# Patient Record
Sex: Male | Born: 1940
Health system: Southern US, Community
[De-identification: ages and names within clinical notes are randomized; demographics above are authoritative.]

## PROBLEM LIST (undated history)

## (undated) DIAGNOSIS — H9190 Unspecified hearing loss, unspecified ear: Secondary | ICD-10-CM

## (undated) DIAGNOSIS — E119 Type 2 diabetes mellitus without complications: Secondary | ICD-10-CM

## (undated) DIAGNOSIS — C801 Malignant (primary) neoplasm, unspecified: Secondary | ICD-10-CM

## (undated) DIAGNOSIS — N189 Chronic kidney disease, unspecified: Secondary | ICD-10-CM

## (undated) DIAGNOSIS — N400 Enlarged prostate without lower urinary tract symptoms: Secondary | ICD-10-CM

## (undated) DIAGNOSIS — C61 Malignant neoplasm of prostate: Secondary | ICD-10-CM

## (undated) DIAGNOSIS — I10 Essential (primary) hypertension: Secondary | ICD-10-CM

## (undated) DIAGNOSIS — K4090 Unilateral inguinal hernia, without obstruction or gangrene, not specified as recurrent: Secondary | ICD-10-CM

## (undated) DIAGNOSIS — R52 Pain, unspecified: Secondary | ICD-10-CM

## (undated) HISTORY — PX: COLONOSCOPY: SHX174

## (undated) HISTORY — PX: HERNIA REPAIR: SHX51

## (undated) HISTORY — PX: BACK SURGERY: SHX140

## (undated) HISTORY — PX: EYE SURGERY: SHX253

## (undated) MED FILL — Dexamethasone Sodium Phosphate Inj 100 MG/10ML: INTRAMUSCULAR | Qty: 1 | Status: AC

---

## 2003-12-10 ENCOUNTER — Other Ambulatory Visit: Payer: Self-pay

## 2007-05-30 ENCOUNTER — Ambulatory Visit: Payer: Self-pay | Admitting: Gastroenterology

## 2007-12-19 ENCOUNTER — Ambulatory Visit: Payer: Self-pay | Admitting: Gastroenterology

## 2011-02-22 ENCOUNTER — Ambulatory Visit: Payer: Self-pay | Admitting: Ophthalmology

## 2011-03-07 ENCOUNTER — Ambulatory Visit: Payer: Self-pay | Admitting: Ophthalmology

## 2013-07-22 ENCOUNTER — Ambulatory Visit: Payer: Self-pay | Admitting: Internal Medicine

## 2013-11-21 ENCOUNTER — Ambulatory Visit: Payer: Self-pay | Admitting: Internal Medicine

## 2015-05-14 DIAGNOSIS — E1065 Type 1 diabetes mellitus with hyperglycemia: Secondary | ICD-10-CM | POA: Diagnosis not present

## 2015-05-14 DIAGNOSIS — E785 Hyperlipidemia, unspecified: Secondary | ICD-10-CM | POA: Diagnosis not present

## 2015-05-19 DIAGNOSIS — N183 Chronic kidney disease, stage 3 (moderate): Secondary | ICD-10-CM | POA: Diagnosis not present

## 2015-05-19 DIAGNOSIS — E1065 Type 1 diabetes mellitus with hyperglycemia: Secondary | ICD-10-CM | POA: Diagnosis not present

## 2015-05-19 DIAGNOSIS — I10 Essential (primary) hypertension: Secondary | ICD-10-CM | POA: Diagnosis not present

## 2015-05-19 DIAGNOSIS — I7389 Other specified peripheral vascular diseases: Secondary | ICD-10-CM | POA: Diagnosis not present

## 2015-05-19 DIAGNOSIS — N401 Enlarged prostate with lower urinary tract symptoms: Secondary | ICD-10-CM | POA: Diagnosis not present

## 2015-05-19 DIAGNOSIS — E785 Hyperlipidemia, unspecified: Secondary | ICD-10-CM | POA: Diagnosis not present

## 2015-05-19 DIAGNOSIS — E790 Hyperuricemia without signs of inflammatory arthritis and tophaceous disease: Secondary | ICD-10-CM | POA: Diagnosis not present

## 2015-05-22 DIAGNOSIS — R69 Illness, unspecified: Secondary | ICD-10-CM | POA: Diagnosis not present

## 2015-05-22 DIAGNOSIS — J029 Acute pharyngitis, unspecified: Secondary | ICD-10-CM | POA: Diagnosis not present

## 2015-05-22 DIAGNOSIS — G8252 Quadriplegia, C1-C4 incomplete: Secondary | ICD-10-CM | POA: Diagnosis not present

## 2015-05-22 DIAGNOSIS — K592 Neurogenic bowel, not elsewhere classified: Secondary | ICD-10-CM | POA: Diagnosis not present

## 2015-05-22 DIAGNOSIS — R609 Edema, unspecified: Secondary | ICD-10-CM | POA: Diagnosis not present

## 2015-06-22 DIAGNOSIS — K592 Neurogenic bowel, not elsewhere classified: Secondary | ICD-10-CM | POA: Diagnosis not present

## 2015-06-22 DIAGNOSIS — R69 Illness, unspecified: Secondary | ICD-10-CM | POA: Diagnosis not present

## 2015-06-22 DIAGNOSIS — J029 Acute pharyngitis, unspecified: Secondary | ICD-10-CM | POA: Diagnosis not present

## 2015-06-22 DIAGNOSIS — G8252 Quadriplegia, C1-C4 incomplete: Secondary | ICD-10-CM | POA: Diagnosis not present

## 2015-06-22 DIAGNOSIS — R609 Edema, unspecified: Secondary | ICD-10-CM | POA: Diagnosis not present

## 2015-06-29 DIAGNOSIS — R5381 Other malaise: Secondary | ICD-10-CM | POA: Diagnosis not present

## 2015-06-29 DIAGNOSIS — E119 Type 2 diabetes mellitus without complications: Secondary | ICD-10-CM | POA: Diagnosis not present

## 2015-06-29 DIAGNOSIS — R270 Ataxia, unspecified: Secondary | ICD-10-CM | POA: Diagnosis not present

## 2015-06-29 DIAGNOSIS — E1129 Type 2 diabetes mellitus with other diabetic kidney complication: Secondary | ICD-10-CM | POA: Diagnosis not present

## 2015-06-29 DIAGNOSIS — Z125 Encounter for screening for malignant neoplasm of prostate: Secondary | ICD-10-CM | POA: Diagnosis not present

## 2015-06-29 DIAGNOSIS — E784 Other hyperlipidemia: Secondary | ICD-10-CM | POA: Diagnosis not present

## 2015-06-29 DIAGNOSIS — I10 Essential (primary) hypertension: Secondary | ICD-10-CM | POA: Diagnosis not present

## 2015-07-01 ENCOUNTER — Other Ambulatory Visit: Payer: Self-pay | Admitting: Internal Medicine

## 2015-07-01 DIAGNOSIS — R2681 Unsteadiness on feet: Secondary | ICD-10-CM

## 2015-07-06 ENCOUNTER — Other Ambulatory Visit: Payer: Self-pay | Admitting: Internal Medicine

## 2015-07-06 ENCOUNTER — Ambulatory Visit
Admission: RE | Admit: 2015-07-06 | Discharge: 2015-07-06 | Disposition: A | Discharge status: Home / Self Care | Payer: Medicare HMO | Source: Ambulatory Visit | Attending: Internal Medicine | Admitting: Internal Medicine

## 2015-07-06 DIAGNOSIS — R2689 Other abnormalities of gait and mobility: Secondary | ICD-10-CM | POA: Diagnosis not present

## 2015-07-06 DIAGNOSIS — R2681 Unsteadiness on feet: Secondary | ICD-10-CM | POA: Insufficient documentation

## 2015-07-06 LAB — POCT I-STAT CREATININE: Creatinine, Ser: 1.6 mg/dL — ABNORMAL HIGH (ref 0.61–1.24)

## 2015-07-06 MED ORDER — IOHEXOL 300 MG/ML  SOLN
80.0000 mL | Freq: Once | INTRAMUSCULAR | Status: AC | PRN
  Administered 2015-07-06: 80 mL via INTRAVENOUS

## 2015-07-09 DIAGNOSIS — I080 Rheumatic disorders of both mitral and aortic valves: Secondary | ICD-10-CM | POA: Diagnosis not present

## 2015-07-09 DIAGNOSIS — E119 Type 2 diabetes mellitus without complications: Secondary | ICD-10-CM | POA: Diagnosis not present

## 2015-07-09 DIAGNOSIS — R2681 Unsteadiness on feet: Secondary | ICD-10-CM | POA: Diagnosis not present

## 2015-07-09 DIAGNOSIS — E8881 Metabolic syndrome: Secondary | ICD-10-CM | POA: Diagnosis not present

## 2015-07-15 DIAGNOSIS — M545 Low back pain: Secondary | ICD-10-CM | POA: Diagnosis not present

## 2015-07-15 DIAGNOSIS — M21371 Foot drop, right foot: Secondary | ICD-10-CM | POA: Diagnosis not present

## 2015-07-20 DIAGNOSIS — J029 Acute pharyngitis, unspecified: Secondary | ICD-10-CM | POA: Diagnosis not present

## 2015-07-20 DIAGNOSIS — R69 Illness, unspecified: Secondary | ICD-10-CM | POA: Diagnosis not present

## 2015-07-20 DIAGNOSIS — G8252 Quadriplegia, C1-C4 incomplete: Secondary | ICD-10-CM | POA: Diagnosis not present

## 2015-07-20 DIAGNOSIS — R609 Edema, unspecified: Secondary | ICD-10-CM | POA: Diagnosis not present

## 2015-07-20 DIAGNOSIS — K592 Neurogenic bowel, not elsewhere classified: Secondary | ICD-10-CM | POA: Diagnosis not present

## 2015-08-16 DIAGNOSIS — E1065 Type 1 diabetes mellitus with hyperglycemia: Secondary | ICD-10-CM | POA: Diagnosis not present

## 2015-08-19 DIAGNOSIS — E1065 Type 1 diabetes mellitus with hyperglycemia: Secondary | ICD-10-CM | POA: Diagnosis not present

## 2015-08-19 DIAGNOSIS — J029 Acute pharyngitis, unspecified: Secondary | ICD-10-CM | POA: Diagnosis not present

## 2015-08-19 DIAGNOSIS — G8252 Quadriplegia, C1-C4 incomplete: Secondary | ICD-10-CM | POA: Diagnosis not present

## 2015-08-19 DIAGNOSIS — E790 Hyperuricemia without signs of inflammatory arthritis and tophaceous disease: Secondary | ICD-10-CM | POA: Diagnosis not present

## 2015-08-19 DIAGNOSIS — M545 Low back pain: Secondary | ICD-10-CM | POA: Diagnosis not present

## 2015-08-19 DIAGNOSIS — N183 Chronic kidney disease, stage 3 (moderate): Secondary | ICD-10-CM | POA: Diagnosis not present

## 2015-08-19 DIAGNOSIS — I10 Essential (primary) hypertension: Secondary | ICD-10-CM | POA: Diagnosis not present

## 2015-08-19 DIAGNOSIS — I7389 Other specified peripheral vascular diseases: Secondary | ICD-10-CM | POA: Diagnosis not present

## 2015-08-19 DIAGNOSIS — R69 Illness, unspecified: Secondary | ICD-10-CM | POA: Diagnosis not present

## 2015-08-19 DIAGNOSIS — R609 Edema, unspecified: Secondary | ICD-10-CM | POA: Diagnosis not present

## 2015-08-19 DIAGNOSIS — E785 Hyperlipidemia, unspecified: Secondary | ICD-10-CM | POA: Diagnosis not present

## 2015-08-19 DIAGNOSIS — K592 Neurogenic bowel, not elsewhere classified: Secondary | ICD-10-CM | POA: Diagnosis not present

## 2015-08-19 DIAGNOSIS — N401 Enlarged prostate with lower urinary tract symptoms: Secondary | ICD-10-CM | POA: Diagnosis not present

## 2015-08-23 DIAGNOSIS — M21372 Foot drop, left foot: Secondary | ICD-10-CM | POA: Diagnosis not present

## 2015-08-23 DIAGNOSIS — M21371 Foot drop, right foot: Secondary | ICD-10-CM | POA: Diagnosis not present

## 2015-08-23 DIAGNOSIS — R2681 Unsteadiness on feet: Secondary | ICD-10-CM | POA: Diagnosis not present

## 2015-08-23 DIAGNOSIS — E1142 Type 2 diabetes mellitus with diabetic polyneuropathy: Secondary | ICD-10-CM | POA: Diagnosis not present

## 2015-09-01 DIAGNOSIS — N138 Other obstructive and reflux uropathy: Secondary | ICD-10-CM | POA: Diagnosis not present

## 2015-09-01 DIAGNOSIS — R351 Nocturia: Secondary | ICD-10-CM | POA: Diagnosis not present

## 2015-09-01 DIAGNOSIS — D400 Neoplasm of uncertain behavior of prostate: Secondary | ICD-10-CM | POA: Diagnosis not present

## 2015-09-03 DIAGNOSIS — M21372 Foot drop, left foot: Secondary | ICD-10-CM | POA: Diagnosis not present

## 2015-09-03 DIAGNOSIS — R2681 Unsteadiness on feet: Secondary | ICD-10-CM | POA: Diagnosis not present

## 2015-09-28 DIAGNOSIS — R609 Edema, unspecified: Secondary | ICD-10-CM | POA: Diagnosis not present

## 2015-09-28 DIAGNOSIS — J029 Acute pharyngitis, unspecified: Secondary | ICD-10-CM | POA: Diagnosis not present

## 2015-09-28 DIAGNOSIS — R69 Illness, unspecified: Secondary | ICD-10-CM | POA: Diagnosis not present

## 2015-09-28 DIAGNOSIS — G8252 Quadriplegia, C1-C4 incomplete: Secondary | ICD-10-CM | POA: Diagnosis not present

## 2015-09-28 DIAGNOSIS — K592 Neurogenic bowel, not elsewhere classified: Secondary | ICD-10-CM | POA: Diagnosis not present

## 2015-10-08 DIAGNOSIS — E118 Type 2 diabetes mellitus with unspecified complications: Secondary | ICD-10-CM | POA: Diagnosis not present

## 2015-10-08 DIAGNOSIS — E784 Other hyperlipidemia: Secondary | ICD-10-CM | POA: Diagnosis not present

## 2015-10-08 DIAGNOSIS — E119 Type 2 diabetes mellitus without complications: Secondary | ICD-10-CM | POA: Diagnosis not present

## 2015-10-08 DIAGNOSIS — I080 Rheumatic disorders of both mitral and aortic valves: Secondary | ICD-10-CM | POA: Diagnosis not present

## 2015-10-21 DIAGNOSIS — R079 Chest pain, unspecified: Secondary | ICD-10-CM | POA: Diagnosis not present

## 2015-10-21 DIAGNOSIS — E118 Type 2 diabetes mellitus with unspecified complications: Secondary | ICD-10-CM | POA: Diagnosis not present

## 2015-10-21 DIAGNOSIS — R0602 Shortness of breath: Secondary | ICD-10-CM | POA: Diagnosis not present

## 2015-10-21 DIAGNOSIS — R200 Anesthesia of skin: Secondary | ICD-10-CM | POA: Diagnosis not present

## 2015-10-28 DIAGNOSIS — R0602 Shortness of breath: Secondary | ICD-10-CM | POA: Diagnosis not present

## 2015-10-28 DIAGNOSIS — E118 Type 2 diabetes mellitus with unspecified complications: Secondary | ICD-10-CM | POA: Diagnosis not present

## 2015-10-28 DIAGNOSIS — I080 Rheumatic disorders of both mitral and aortic valves: Secondary | ICD-10-CM | POA: Diagnosis not present

## 2015-10-28 DIAGNOSIS — R079 Chest pain, unspecified: Secondary | ICD-10-CM | POA: Diagnosis not present

## 2015-10-29 DIAGNOSIS — R609 Edema, unspecified: Secondary | ICD-10-CM | POA: Diagnosis not present

## 2015-10-29 DIAGNOSIS — K592 Neurogenic bowel, not elsewhere classified: Secondary | ICD-10-CM | POA: Diagnosis not present

## 2015-10-29 DIAGNOSIS — J029 Acute pharyngitis, unspecified: Secondary | ICD-10-CM | POA: Diagnosis not present

## 2015-10-29 DIAGNOSIS — G8252 Quadriplegia, C1-C4 incomplete: Secondary | ICD-10-CM | POA: Diagnosis not present

## 2015-10-29 DIAGNOSIS — R69 Illness, unspecified: Secondary | ICD-10-CM | POA: Diagnosis not present

## 2015-10-29 DIAGNOSIS — Z954 Presence of other heart-valve replacement: Secondary | ICD-10-CM | POA: Diagnosis not present

## 2015-11-04 DIAGNOSIS — M170 Bilateral primary osteoarthritis of knee: Secondary | ICD-10-CM | POA: Diagnosis not present

## 2015-11-11 DIAGNOSIS — E1065 Type 1 diabetes mellitus with hyperglycemia: Secondary | ICD-10-CM | POA: Diagnosis not present

## 2015-11-11 DIAGNOSIS — Z125 Encounter for screening for malignant neoplasm of prostate: Secondary | ICD-10-CM | POA: Diagnosis not present

## 2015-11-11 DIAGNOSIS — N183 Chronic kidney disease, stage 3 (moderate): Secondary | ICD-10-CM | POA: Diagnosis not present

## 2015-11-11 DIAGNOSIS — I7389 Other specified peripheral vascular diseases: Secondary | ICD-10-CM | POA: Diagnosis not present

## 2015-11-18 DIAGNOSIS — I7389 Other specified peripheral vascular diseases: Secondary | ICD-10-CM | POA: Diagnosis not present

## 2015-11-18 DIAGNOSIS — N401 Enlarged prostate with lower urinary tract symptoms: Secondary | ICD-10-CM | POA: Diagnosis not present

## 2015-11-18 DIAGNOSIS — E785 Hyperlipidemia, unspecified: Secondary | ICD-10-CM | POA: Diagnosis not present

## 2015-11-18 DIAGNOSIS — R69 Illness, unspecified: Secondary | ICD-10-CM | POA: Diagnosis not present

## 2015-11-18 DIAGNOSIS — E1065 Type 1 diabetes mellitus with hyperglycemia: Secondary | ICD-10-CM | POA: Diagnosis not present

## 2015-11-18 DIAGNOSIS — E790 Hyperuricemia without signs of inflammatory arthritis and tophaceous disease: Secondary | ICD-10-CM | POA: Diagnosis not present

## 2015-11-18 DIAGNOSIS — N183 Chronic kidney disease, stage 3 (moderate): Secondary | ICD-10-CM | POA: Diagnosis not present

## 2015-11-18 DIAGNOSIS — R609 Edema, unspecified: Secondary | ICD-10-CM | POA: Diagnosis not present

## 2015-11-18 DIAGNOSIS — Z954 Presence of other heart-valve replacement: Secondary | ICD-10-CM | POA: Diagnosis not present

## 2015-11-18 DIAGNOSIS — I10 Essential (primary) hypertension: Secondary | ICD-10-CM | POA: Diagnosis not present

## 2015-11-23 DIAGNOSIS — K592 Neurogenic bowel, not elsewhere classified: Secondary | ICD-10-CM | POA: Diagnosis not present

## 2015-11-23 DIAGNOSIS — G8252 Quadriplegia, C1-C4 incomplete: Secondary | ICD-10-CM | POA: Diagnosis not present

## 2015-11-23 DIAGNOSIS — J029 Acute pharyngitis, unspecified: Secondary | ICD-10-CM | POA: Diagnosis not present

## 2015-11-23 DIAGNOSIS — Z954 Presence of other heart-valve replacement: Secondary | ICD-10-CM | POA: Diagnosis not present

## 2015-11-23 DIAGNOSIS — R609 Edema, unspecified: Secondary | ICD-10-CM | POA: Diagnosis not present

## 2015-11-23 DIAGNOSIS — R69 Illness, unspecified: Secondary | ICD-10-CM | POA: Diagnosis not present

## 2015-12-22 DIAGNOSIS — M21372 Foot drop, left foot: Secondary | ICD-10-CM | POA: Diagnosis not present

## 2015-12-22 DIAGNOSIS — R2681 Unsteadiness on feet: Secondary | ICD-10-CM | POA: Diagnosis not present

## 2015-12-22 DIAGNOSIS — E1142 Type 2 diabetes mellitus with diabetic polyneuropathy: Secondary | ICD-10-CM | POA: Diagnosis not present

## 2015-12-22 DIAGNOSIS — M21371 Foot drop, right foot: Secondary | ICD-10-CM | POA: Diagnosis not present

## 2015-12-28 DIAGNOSIS — Z954 Presence of other heart-valve replacement: Secondary | ICD-10-CM | POA: Diagnosis not present

## 2015-12-28 DIAGNOSIS — R69 Illness, unspecified: Secondary | ICD-10-CM | POA: Diagnosis not present

## 2015-12-28 DIAGNOSIS — K592 Neurogenic bowel, not elsewhere classified: Secondary | ICD-10-CM | POA: Diagnosis not present

## 2015-12-28 DIAGNOSIS — J029 Acute pharyngitis, unspecified: Secondary | ICD-10-CM | POA: Diagnosis not present

## 2015-12-28 DIAGNOSIS — R609 Edema, unspecified: Secondary | ICD-10-CM | POA: Diagnosis not present

## 2015-12-28 DIAGNOSIS — G8252 Quadriplegia, C1-C4 incomplete: Secondary | ICD-10-CM | POA: Diagnosis not present

## 2016-01-21 DIAGNOSIS — K592 Neurogenic bowel, not elsewhere classified: Secondary | ICD-10-CM | POA: Diagnosis not present

## 2016-01-21 DIAGNOSIS — J029 Acute pharyngitis, unspecified: Secondary | ICD-10-CM | POA: Diagnosis not present

## 2016-01-21 DIAGNOSIS — R69 Illness, unspecified: Secondary | ICD-10-CM | POA: Diagnosis not present

## 2016-01-21 DIAGNOSIS — R609 Edema, unspecified: Secondary | ICD-10-CM | POA: Diagnosis not present

## 2016-01-21 DIAGNOSIS — G8252 Quadriplegia, C1-C4 incomplete: Secondary | ICD-10-CM | POA: Diagnosis not present

## 2016-01-28 DIAGNOSIS — E119 Type 2 diabetes mellitus without complications: Secondary | ICD-10-CM | POA: Diagnosis not present

## 2016-01-28 DIAGNOSIS — E784 Other hyperlipidemia: Secondary | ICD-10-CM | POA: Diagnosis not present

## 2016-01-28 DIAGNOSIS — Z23 Encounter for immunization: Secondary | ICD-10-CM | POA: Diagnosis not present

## 2016-01-28 DIAGNOSIS — R0602 Shortness of breath: Secondary | ICD-10-CM | POA: Diagnosis not present

## 2016-01-28 DIAGNOSIS — I080 Rheumatic disorders of both mitral and aortic valves: Secondary | ICD-10-CM | POA: Diagnosis not present

## 2016-02-14 DIAGNOSIS — I7389 Other specified peripheral vascular diseases: Secondary | ICD-10-CM | POA: Diagnosis not present

## 2016-02-14 DIAGNOSIS — E785 Hyperlipidemia, unspecified: Secondary | ICD-10-CM | POA: Diagnosis not present

## 2016-02-14 DIAGNOSIS — N183 Chronic kidney disease, stage 3 (moderate): Secondary | ICD-10-CM | POA: Diagnosis not present

## 2016-02-15 DIAGNOSIS — G8252 Quadriplegia, C1-C4 incomplete: Secondary | ICD-10-CM | POA: Diagnosis not present

## 2016-02-15 DIAGNOSIS — R609 Edema, unspecified: Secondary | ICD-10-CM | POA: Diagnosis not present

## 2016-02-15 DIAGNOSIS — R69 Illness, unspecified: Secondary | ICD-10-CM | POA: Diagnosis not present

## 2016-02-15 DIAGNOSIS — J029 Acute pharyngitis, unspecified: Secondary | ICD-10-CM | POA: Diagnosis not present

## 2016-02-15 DIAGNOSIS — K592 Neurogenic bowel, not elsewhere classified: Secondary | ICD-10-CM | POA: Diagnosis not present

## 2016-02-17 DIAGNOSIS — E790 Hyperuricemia without signs of inflammatory arthritis and tophaceous disease: Secondary | ICD-10-CM | POA: Diagnosis not present

## 2016-02-17 DIAGNOSIS — I7389 Other specified peripheral vascular diseases: Secondary | ICD-10-CM | POA: Diagnosis not present

## 2016-02-17 DIAGNOSIS — E785 Hyperlipidemia, unspecified: Secondary | ICD-10-CM | POA: Diagnosis not present

## 2016-02-17 DIAGNOSIS — I10 Essential (primary) hypertension: Secondary | ICD-10-CM | POA: Diagnosis not present

## 2016-02-17 DIAGNOSIS — E1065 Type 1 diabetes mellitus with hyperglycemia: Secondary | ICD-10-CM | POA: Diagnosis not present

## 2016-02-17 DIAGNOSIS — N401 Enlarged prostate with lower urinary tract symptoms: Secondary | ICD-10-CM | POA: Diagnosis not present

## 2016-02-25 DIAGNOSIS — H2511 Age-related nuclear cataract, right eye: Secondary | ICD-10-CM | POA: Diagnosis not present

## 2016-03-01 DIAGNOSIS — R351 Nocturia: Secondary | ICD-10-CM | POA: Diagnosis not present

## 2016-03-01 DIAGNOSIS — N401 Enlarged prostate with lower urinary tract symptoms: Secondary | ICD-10-CM | POA: Diagnosis not present

## 2016-03-01 DIAGNOSIS — D400 Neoplasm of uncertain behavior of prostate: Secondary | ICD-10-CM | POA: Diagnosis not present

## 2016-03-14 DIAGNOSIS — R609 Edema, unspecified: Secondary | ICD-10-CM | POA: Diagnosis not present

## 2016-03-14 DIAGNOSIS — G8252 Quadriplegia, C1-C4 incomplete: Secondary | ICD-10-CM | POA: Diagnosis not present

## 2016-03-14 DIAGNOSIS — R69 Illness, unspecified: Secondary | ICD-10-CM | POA: Diagnosis not present

## 2016-03-14 DIAGNOSIS — J029 Acute pharyngitis, unspecified: Secondary | ICD-10-CM | POA: Diagnosis not present

## 2016-03-14 DIAGNOSIS — K592 Neurogenic bowel, not elsewhere classified: Secondary | ICD-10-CM | POA: Diagnosis not present

## 2016-03-20 DIAGNOSIS — M170 Bilateral primary osteoarthritis of knee: Secondary | ICD-10-CM | POA: Diagnosis not present

## 2016-04-07 DIAGNOSIS — R69 Illness, unspecified: Secondary | ICD-10-CM | POA: Diagnosis not present

## 2016-04-07 DIAGNOSIS — C771 Secondary and unspecified malignant neoplasm of intrathoracic lymph nodes: Secondary | ICD-10-CM | POA: Diagnosis not present

## 2016-04-07 DIAGNOSIS — C50111 Malignant neoplasm of central portion of right female breast: Secondary | ICD-10-CM | POA: Diagnosis not present

## 2016-04-25 DIAGNOSIS — C50111 Malignant neoplasm of central portion of right female breast: Secondary | ICD-10-CM | POA: Diagnosis not present

## 2016-04-25 DIAGNOSIS — R69 Illness, unspecified: Secondary | ICD-10-CM | POA: Diagnosis not present

## 2016-04-25 DIAGNOSIS — C771 Secondary and unspecified malignant neoplasm of intrathoracic lymph nodes: Secondary | ICD-10-CM | POA: Diagnosis not present

## 2016-05-18 DIAGNOSIS — I7389 Other specified peripheral vascular diseases: Secondary | ICD-10-CM | POA: Diagnosis not present

## 2016-05-18 DIAGNOSIS — E785 Hyperlipidemia, unspecified: Secondary | ICD-10-CM | POA: Diagnosis not present

## 2016-05-18 DIAGNOSIS — E790 Hyperuricemia without signs of inflammatory arthritis and tophaceous disease: Secondary | ICD-10-CM | POA: Diagnosis not present

## 2016-05-18 DIAGNOSIS — I10 Essential (primary) hypertension: Secondary | ICD-10-CM | POA: Diagnosis not present

## 2016-05-18 DIAGNOSIS — E1065 Type 1 diabetes mellitus with hyperglycemia: Secondary | ICD-10-CM | POA: Diagnosis not present

## 2016-05-18 DIAGNOSIS — N401 Enlarged prostate with lower urinary tract symptoms: Secondary | ICD-10-CM | POA: Diagnosis not present

## 2016-05-18 DIAGNOSIS — N183 Chronic kidney disease, stage 3 (moderate): Secondary | ICD-10-CM | POA: Diagnosis not present

## 2016-05-23 DIAGNOSIS — E118 Type 2 diabetes mellitus with unspecified complications: Secondary | ICD-10-CM | POA: Diagnosis not present

## 2016-05-23 DIAGNOSIS — Z789 Other specified health status: Secondary | ICD-10-CM | POA: Diagnosis not present

## 2016-05-23 DIAGNOSIS — E8881 Metabolic syndrome: Secondary | ICD-10-CM | POA: Diagnosis not present

## 2016-05-23 DIAGNOSIS — R011 Cardiac murmur, unspecified: Secondary | ICD-10-CM | POA: Diagnosis not present

## 2016-06-01 DIAGNOSIS — R69 Illness, unspecified: Secondary | ICD-10-CM | POA: Diagnosis not present

## 2016-06-26 DIAGNOSIS — E538 Deficiency of other specified B group vitamins: Secondary | ICD-10-CM | POA: Diagnosis not present

## 2016-06-26 DIAGNOSIS — R7309 Other abnormal glucose: Secondary | ICD-10-CM | POA: Diagnosis not present

## 2016-06-26 DIAGNOSIS — G629 Polyneuropathy, unspecified: Secondary | ICD-10-CM | POA: Diagnosis not present

## 2016-07-06 DIAGNOSIS — R69 Illness, unspecified: Secondary | ICD-10-CM | POA: Diagnosis not present

## 2016-07-20 DIAGNOSIS — E8881 Metabolic syndrome: Secondary | ICD-10-CM | POA: Diagnosis not present

## 2016-07-20 DIAGNOSIS — E118 Type 2 diabetes mellitus with unspecified complications: Secondary | ICD-10-CM | POA: Diagnosis not present

## 2016-07-20 DIAGNOSIS — R200 Anesthesia of skin: Secondary | ICD-10-CM | POA: Diagnosis not present

## 2016-07-20 DIAGNOSIS — R011 Cardiac murmur, unspecified: Secondary | ICD-10-CM | POA: Diagnosis not present

## 2016-07-21 DIAGNOSIS — E785 Hyperlipidemia, unspecified: Secondary | ICD-10-CM | POA: Diagnosis not present

## 2016-07-21 DIAGNOSIS — E1065 Type 1 diabetes mellitus with hyperglycemia: Secondary | ICD-10-CM | POA: Diagnosis not present

## 2016-07-21 DIAGNOSIS — I10 Essential (primary) hypertension: Secondary | ICD-10-CM | POA: Diagnosis not present

## 2016-07-21 DIAGNOSIS — I7389 Other specified peripheral vascular diseases: Secondary | ICD-10-CM | POA: Diagnosis not present

## 2016-07-28 DIAGNOSIS — N183 Chronic kidney disease, stage 3 (moderate): Secondary | ICD-10-CM | POA: Diagnosis not present

## 2016-07-28 DIAGNOSIS — E790 Hyperuricemia without signs of inflammatory arthritis and tophaceous disease: Secondary | ICD-10-CM | POA: Diagnosis not present

## 2016-07-28 DIAGNOSIS — E1065 Type 1 diabetes mellitus with hyperglycemia: Secondary | ICD-10-CM | POA: Diagnosis not present

## 2016-07-28 DIAGNOSIS — N401 Enlarged prostate with lower urinary tract symptoms: Secondary | ICD-10-CM | POA: Diagnosis not present

## 2016-07-28 DIAGNOSIS — I10 Essential (primary) hypertension: Secondary | ICD-10-CM | POA: Diagnosis not present

## 2016-07-28 DIAGNOSIS — I7389 Other specified peripheral vascular diseases: Secondary | ICD-10-CM | POA: Diagnosis not present

## 2016-07-28 DIAGNOSIS — E785 Hyperlipidemia, unspecified: Secondary | ICD-10-CM | POA: Diagnosis not present

## 2016-07-31 DIAGNOSIS — Z0000 Encounter for general adult medical examination without abnormal findings: Secondary | ICD-10-CM | POA: Diagnosis not present

## 2016-08-29 DIAGNOSIS — M170 Bilateral primary osteoarthritis of knee: Secondary | ICD-10-CM | POA: Diagnosis not present

## 2016-08-29 DIAGNOSIS — M25561 Pain in right knee: Secondary | ICD-10-CM | POA: Diagnosis not present

## 2016-08-29 DIAGNOSIS — M25562 Pain in left knee: Secondary | ICD-10-CM | POA: Diagnosis not present

## 2016-08-30 DIAGNOSIS — R69 Illness, unspecified: Secondary | ICD-10-CM | POA: Diagnosis not present

## 2016-09-04 ENCOUNTER — Ambulatory Visit: Payer: Medicare HMO | Admitting: Physical Therapy

## 2016-09-04 DIAGNOSIS — N401 Enlarged prostate with lower urinary tract symptoms: Secondary | ICD-10-CM | POA: Diagnosis not present

## 2016-09-04 DIAGNOSIS — R3914 Feeling of incomplete bladder emptying: Secondary | ICD-10-CM | POA: Diagnosis not present

## 2016-09-04 DIAGNOSIS — D400 Neoplasm of uncertain behavior of prostate: Secondary | ICD-10-CM | POA: Diagnosis not present

## 2016-09-04 DIAGNOSIS — R351 Nocturia: Secondary | ICD-10-CM | POA: Diagnosis not present

## 2016-09-07 ENCOUNTER — Ambulatory Visit: Payer: Medicare HMO | Admitting: Physical Therapy

## 2016-09-07 DIAGNOSIS — R262 Difficulty in walking, not elsewhere classified: Secondary | ICD-10-CM | POA: Diagnosis not present

## 2016-09-07 DIAGNOSIS — M170 Bilateral primary osteoarthritis of knee: Secondary | ICD-10-CM | POA: Diagnosis not present

## 2016-09-07 DIAGNOSIS — M1712 Unilateral primary osteoarthritis, left knee: Secondary | ICD-10-CM | POA: Diagnosis not present

## 2016-09-07 DIAGNOSIS — M25561 Pain in right knee: Secondary | ICD-10-CM | POA: Diagnosis not present

## 2016-09-07 DIAGNOSIS — M25562 Pain in left knee: Secondary | ICD-10-CM | POA: Diagnosis not present

## 2016-09-11 ENCOUNTER — Ambulatory Visit: Payer: Medicare HMO | Attending: Neurology | Admitting: Physical Therapy

## 2016-09-11 DIAGNOSIS — R262 Difficulty in walking, not elsewhere classified: Secondary | ICD-10-CM

## 2016-09-11 NOTE — Therapy (Signed)
[place] MAIN [facility] SERVICES [address] [place], [place], [address] Phone: [phone]   Fax:  [phone]  Patient Details  Name: Keith Howe MRN: [id number] Date of Birth: May 19, 1940 Referring Provider:  Anabel Bene, MD  Encounter Date: 09/11/2016  Patient is being treated for another diagnosis at another clinic and was advised to return for evaluation when he is discharged from the facility.   Alanson Puls, PT, DPT [place], Minette Headland S 09/11/2016, 4:49 PM  [place] [facility]IN [facility] SERVICES [address] [place], [place], [address] Phone: [phone]   Fax:  [phone]

## 2016-09-13 ENCOUNTER — Ambulatory Visit: Payer: Medicare HMO | Admitting: Physical Therapy

## 2016-09-14 DIAGNOSIS — M1712 Unilateral primary osteoarthritis, left knee: Secondary | ICD-10-CM | POA: Diagnosis not present

## 2016-09-14 DIAGNOSIS — M25562 Pain in left knee: Secondary | ICD-10-CM | POA: Diagnosis not present

## 2016-09-18 ENCOUNTER — Ambulatory Visit: Payer: Medicare HMO | Admitting: Physical Therapy

## 2016-09-20 ENCOUNTER — Ambulatory Visit: Payer: Medicare HMO | Admitting: Physical Therapy

## 2016-09-21 DIAGNOSIS — M25562 Pain in left knee: Secondary | ICD-10-CM | POA: Diagnosis not present

## 2016-09-21 DIAGNOSIS — M1712 Unilateral primary osteoarthritis, left knee: Secondary | ICD-10-CM | POA: Diagnosis not present

## 2016-09-22 DIAGNOSIS — R69 Illness, unspecified: Secondary | ICD-10-CM | POA: Diagnosis not present

## 2016-09-25 ENCOUNTER — Ambulatory Visit: Payer: Medicare HMO | Admitting: Physical Therapy

## 2016-09-25 DIAGNOSIS — Z794 Long term (current) use of insulin: Secondary | ICD-10-CM | POA: Diagnosis not present

## 2016-09-25 DIAGNOSIS — M21372 Foot drop, left foot: Secondary | ICD-10-CM | POA: Diagnosis not present

## 2016-09-25 DIAGNOSIS — R2681 Unsteadiness on feet: Secondary | ICD-10-CM | POA: Diagnosis not present

## 2016-09-25 DIAGNOSIS — E119 Type 2 diabetes mellitus without complications: Secondary | ICD-10-CM | POA: Diagnosis not present

## 2016-09-27 ENCOUNTER — Ambulatory Visit: Payer: Medicare HMO | Admitting: Physical Therapy

## 2016-10-04 ENCOUNTER — Ambulatory Visit: Payer: Medicare HMO | Admitting: Physical Therapy

## 2016-10-05 DIAGNOSIS — M1712 Unilateral primary osteoarthritis, left knee: Secondary | ICD-10-CM | POA: Diagnosis not present

## 2016-10-05 DIAGNOSIS — M25562 Pain in left knee: Secondary | ICD-10-CM | POA: Diagnosis not present

## 2016-10-09 ENCOUNTER — Ambulatory Visit: Payer: Medicare HMO | Admitting: Physical Therapy

## 2016-10-11 ENCOUNTER — Ambulatory Visit: Payer: Medicare HMO | Admitting: Physical Therapy

## 2016-10-12 DIAGNOSIS — M1711 Unilateral primary osteoarthritis, right knee: Secondary | ICD-10-CM | POA: Diagnosis not present

## 2016-10-12 DIAGNOSIS — M25561 Pain in right knee: Secondary | ICD-10-CM | POA: Diagnosis not present

## 2016-10-16 ENCOUNTER — Ambulatory Visit: Payer: Medicare HMO | Admitting: Physical Therapy

## 2016-10-16 DIAGNOSIS — R69 Illness, unspecified: Secondary | ICD-10-CM | POA: Diagnosis not present

## 2016-10-18 ENCOUNTER — Ambulatory Visit: Payer: Medicare HMO | Admitting: Physical Therapy

## 2016-10-24 DIAGNOSIS — M1711 Unilateral primary osteoarthritis, right knee: Secondary | ICD-10-CM | POA: Diagnosis not present

## 2016-10-24 DIAGNOSIS — M25561 Pain in right knee: Secondary | ICD-10-CM | POA: Diagnosis not present

## 2016-10-25 DIAGNOSIS — E119 Type 2 diabetes mellitus without complications: Secondary | ICD-10-CM | POA: Diagnosis not present

## 2016-10-25 DIAGNOSIS — R011 Cardiac murmur, unspecified: Secondary | ICD-10-CM | POA: Diagnosis not present

## 2016-10-25 DIAGNOSIS — E118 Type 2 diabetes mellitus with unspecified complications: Secondary | ICD-10-CM | POA: Diagnosis not present

## 2016-10-25 DIAGNOSIS — E784 Other hyperlipidemia: Secondary | ICD-10-CM | POA: Diagnosis not present

## 2016-10-31 DIAGNOSIS — I10 Essential (primary) hypertension: Secondary | ICD-10-CM | POA: Diagnosis not present

## 2016-10-31 DIAGNOSIS — E790 Hyperuricemia without signs of inflammatory arthritis and tophaceous disease: Secondary | ICD-10-CM | POA: Diagnosis not present

## 2016-10-31 DIAGNOSIS — N401 Enlarged prostate with lower urinary tract symptoms: Secondary | ICD-10-CM | POA: Diagnosis not present

## 2016-10-31 DIAGNOSIS — E785 Hyperlipidemia, unspecified: Secondary | ICD-10-CM | POA: Diagnosis not present

## 2016-10-31 DIAGNOSIS — I7389 Other specified peripheral vascular diseases: Secondary | ICD-10-CM | POA: Diagnosis not present

## 2016-10-31 DIAGNOSIS — E1065 Type 1 diabetes mellitus with hyperglycemia: Secondary | ICD-10-CM | POA: Diagnosis not present

## 2016-10-31 DIAGNOSIS — N183 Chronic kidney disease, stage 3 (moderate): Secondary | ICD-10-CM | POA: Diagnosis not present

## 2016-11-02 DIAGNOSIS — M1711 Unilateral primary osteoarthritis, right knee: Secondary | ICD-10-CM | POA: Diagnosis not present

## 2016-11-02 DIAGNOSIS — M25561 Pain in right knee: Secondary | ICD-10-CM | POA: Diagnosis not present

## 2016-11-14 DIAGNOSIS — R69 Illness, unspecified: Secondary | ICD-10-CM | POA: Diagnosis not present

## 2016-11-15 DIAGNOSIS — R69 Illness, unspecified: Secondary | ICD-10-CM | POA: Diagnosis not present

## 2016-11-16 DIAGNOSIS — M25561 Pain in right knee: Secondary | ICD-10-CM | POA: Diagnosis not present

## 2016-11-16 DIAGNOSIS — M1711 Unilateral primary osteoarthritis, right knee: Secondary | ICD-10-CM | POA: Diagnosis not present

## 2016-12-01 DIAGNOSIS — E785 Hyperlipidemia, unspecified: Secondary | ICD-10-CM | POA: Diagnosis not present

## 2016-12-01 DIAGNOSIS — E1065 Type 1 diabetes mellitus with hyperglycemia: Secondary | ICD-10-CM | POA: Diagnosis not present

## 2016-12-14 DIAGNOSIS — I10 Essential (primary) hypertension: Secondary | ICD-10-CM | POA: Diagnosis not present

## 2016-12-14 DIAGNOSIS — E1065 Type 1 diabetes mellitus with hyperglycemia: Secondary | ICD-10-CM | POA: Diagnosis not present

## 2016-12-14 DIAGNOSIS — R69 Illness, unspecified: Secondary | ICD-10-CM | POA: Diagnosis not present

## 2016-12-14 DIAGNOSIS — N401 Enlarged prostate with lower urinary tract symptoms: Secondary | ICD-10-CM | POA: Diagnosis not present

## 2016-12-14 DIAGNOSIS — I7389 Other specified peripheral vascular diseases: Secondary | ICD-10-CM | POA: Diagnosis not present

## 2016-12-14 DIAGNOSIS — E785 Hyperlipidemia, unspecified: Secondary | ICD-10-CM | POA: Diagnosis not present

## 2016-12-14 DIAGNOSIS — E790 Hyperuricemia without signs of inflammatory arthritis and tophaceous disease: Secondary | ICD-10-CM | POA: Diagnosis not present

## 2016-12-14 DIAGNOSIS — N183 Chronic kidney disease, stage 3 (moderate): Secondary | ICD-10-CM | POA: Diagnosis not present

## 2016-12-16 DIAGNOSIS — R69 Illness, unspecified: Secondary | ICD-10-CM | POA: Diagnosis not present

## 2017-01-09 DIAGNOSIS — R69 Illness, unspecified: Secondary | ICD-10-CM | POA: Diagnosis not present

## 2017-01-25 DIAGNOSIS — Z23 Encounter for immunization: Secondary | ICD-10-CM | POA: Diagnosis not present

## 2017-01-25 DIAGNOSIS — E118 Type 2 diabetes mellitus with unspecified complications: Secondary | ICD-10-CM | POA: Diagnosis not present

## 2017-01-25 DIAGNOSIS — E784 Other hyperlipidemia: Secondary | ICD-10-CM | POA: Diagnosis not present

## 2017-01-25 DIAGNOSIS — R011 Cardiac murmur, unspecified: Secondary | ICD-10-CM | POA: Diagnosis not present

## 2017-01-25 DIAGNOSIS — E119 Type 2 diabetes mellitus without complications: Secondary | ICD-10-CM | POA: Diagnosis not present

## 2017-01-28 DIAGNOSIS — R69 Illness, unspecified: Secondary | ICD-10-CM | POA: Diagnosis not present

## 2017-02-21 DIAGNOSIS — R69 Illness, unspecified: Secondary | ICD-10-CM | POA: Diagnosis not present

## 2017-03-06 DIAGNOSIS — R351 Nocturia: Secondary | ICD-10-CM | POA: Diagnosis not present

## 2017-03-06 DIAGNOSIS — N5201 Erectile dysfunction due to arterial insufficiency: Secondary | ICD-10-CM | POA: Diagnosis not present

## 2017-03-06 DIAGNOSIS — N401 Enlarged prostate with lower urinary tract symptoms: Secondary | ICD-10-CM | POA: Diagnosis not present

## 2017-03-06 DIAGNOSIS — D400 Neoplasm of uncertain behavior of prostate: Secondary | ICD-10-CM | POA: Diagnosis not present

## 2017-03-06 DIAGNOSIS — R3914 Feeling of incomplete bladder emptying: Secondary | ICD-10-CM | POA: Diagnosis not present

## 2017-03-26 DIAGNOSIS — R69 Illness, unspecified: Secondary | ICD-10-CM | POA: Diagnosis not present

## 2017-03-26 DIAGNOSIS — E1065 Type 1 diabetes mellitus with hyperglycemia: Secondary | ICD-10-CM | POA: Diagnosis not present

## 2017-03-26 DIAGNOSIS — E790 Hyperuricemia without signs of inflammatory arthritis and tophaceous disease: Secondary | ICD-10-CM | POA: Diagnosis not present

## 2017-03-26 DIAGNOSIS — N401 Enlarged prostate with lower urinary tract symptoms: Secondary | ICD-10-CM | POA: Diagnosis not present

## 2017-03-26 DIAGNOSIS — I10 Essential (primary) hypertension: Secondary | ICD-10-CM | POA: Diagnosis not present

## 2017-03-26 DIAGNOSIS — I7389 Other specified peripheral vascular diseases: Secondary | ICD-10-CM | POA: Diagnosis not present

## 2017-03-26 DIAGNOSIS — N183 Chronic kidney disease, stage 3 (moderate): Secondary | ICD-10-CM | POA: Diagnosis not present

## 2017-03-26 DIAGNOSIS — E785 Hyperlipidemia, unspecified: Secondary | ICD-10-CM | POA: Diagnosis not present

## 2017-04-16 DIAGNOSIS — R69 Illness, unspecified: Secondary | ICD-10-CM | POA: Diagnosis not present

## 2017-05-09 DIAGNOSIS — R69 Illness, unspecified: Secondary | ICD-10-CM | POA: Diagnosis not present

## 2017-05-16 DIAGNOSIS — H25011 Cortical age-related cataract, right eye: Secondary | ICD-10-CM | POA: Diagnosis not present

## 2017-05-22 DIAGNOSIS — M170 Bilateral primary osteoarthritis of knee: Secondary | ICD-10-CM | POA: Diagnosis not present

## 2017-05-22 DIAGNOSIS — M25562 Pain in left knee: Secondary | ICD-10-CM | POA: Diagnosis not present

## 2017-05-22 DIAGNOSIS — M25561 Pain in right knee: Secondary | ICD-10-CM | POA: Diagnosis not present

## 2017-05-31 DIAGNOSIS — M1712 Unilateral primary osteoarthritis, left knee: Secondary | ICD-10-CM | POA: Diagnosis not present

## 2017-05-31 DIAGNOSIS — M25562 Pain in left knee: Secondary | ICD-10-CM | POA: Diagnosis not present

## 2017-06-06 DIAGNOSIS — R69 Illness, unspecified: Secondary | ICD-10-CM | POA: Diagnosis not present

## 2017-06-07 DIAGNOSIS — M25562 Pain in left knee: Secondary | ICD-10-CM | POA: Diagnosis not present

## 2017-06-07 DIAGNOSIS — M1712 Unilateral primary osteoarthritis, left knee: Secondary | ICD-10-CM | POA: Diagnosis not present

## 2017-06-14 DIAGNOSIS — M25562 Pain in left knee: Secondary | ICD-10-CM | POA: Diagnosis not present

## 2017-06-14 DIAGNOSIS — E785 Hyperlipidemia, unspecified: Secondary | ICD-10-CM | POA: Diagnosis not present

## 2017-06-14 DIAGNOSIS — I10 Essential (primary) hypertension: Secondary | ICD-10-CM | POA: Diagnosis not present

## 2017-06-14 DIAGNOSIS — N400 Enlarged prostate without lower urinary tract symptoms: Secondary | ICD-10-CM | POA: Diagnosis not present

## 2017-06-14 DIAGNOSIS — M1712 Unilateral primary osteoarthritis, left knee: Secondary | ICD-10-CM | POA: Diagnosis not present

## 2017-06-14 DIAGNOSIS — E119 Type 2 diabetes mellitus without complications: Secondary | ICD-10-CM | POA: Diagnosis not present

## 2017-06-14 DIAGNOSIS — Z794 Long term (current) use of insulin: Secondary | ICD-10-CM | POA: Diagnosis not present

## 2017-06-19 DIAGNOSIS — E1065 Type 1 diabetes mellitus with hyperglycemia: Secondary | ICD-10-CM | POA: Diagnosis not present

## 2017-06-19 DIAGNOSIS — I10 Essential (primary) hypertension: Secondary | ICD-10-CM | POA: Diagnosis not present

## 2017-06-19 DIAGNOSIS — E785 Hyperlipidemia, unspecified: Secondary | ICD-10-CM | POA: Diagnosis not present

## 2017-06-21 DIAGNOSIS — M25562 Pain in left knee: Secondary | ICD-10-CM | POA: Diagnosis not present

## 2017-06-21 DIAGNOSIS — M1712 Unilateral primary osteoarthritis, left knee: Secondary | ICD-10-CM | POA: Diagnosis not present

## 2017-06-26 DIAGNOSIS — E785 Hyperlipidemia, unspecified: Secondary | ICD-10-CM | POA: Diagnosis not present

## 2017-06-26 DIAGNOSIS — I7389 Other specified peripheral vascular diseases: Secondary | ICD-10-CM | POA: Diagnosis not present

## 2017-06-26 DIAGNOSIS — E1065 Type 1 diabetes mellitus with hyperglycemia: Secondary | ICD-10-CM | POA: Diagnosis not present

## 2017-06-26 DIAGNOSIS — I10 Essential (primary) hypertension: Secondary | ICD-10-CM | POA: Diagnosis not present

## 2017-06-26 DIAGNOSIS — N183 Chronic kidney disease, stage 3 (moderate): Secondary | ICD-10-CM | POA: Diagnosis not present

## 2017-06-26 DIAGNOSIS — N401 Enlarged prostate with lower urinary tract symptoms: Secondary | ICD-10-CM | POA: Diagnosis not present

## 2017-06-26 DIAGNOSIS — R69 Illness, unspecified: Secondary | ICD-10-CM | POA: Diagnosis not present

## 2017-06-26 DIAGNOSIS — E790 Hyperuricemia without signs of inflammatory arthritis and tophaceous disease: Secondary | ICD-10-CM | POA: Diagnosis not present

## 2017-06-26 DIAGNOSIS — E10649 Type 1 diabetes mellitus with hypoglycemia without coma: Secondary | ICD-10-CM | POA: Diagnosis not present

## 2017-06-27 DIAGNOSIS — H2511 Age-related nuclear cataract, right eye: Secondary | ICD-10-CM | POA: Diagnosis not present

## 2017-06-28 DIAGNOSIS — M25561 Pain in right knee: Secondary | ICD-10-CM | POA: Diagnosis not present

## 2017-06-28 DIAGNOSIS — M1711 Unilateral primary osteoarthritis, right knee: Secondary | ICD-10-CM | POA: Diagnosis not present

## 2017-07-02 ENCOUNTER — Encounter: Payer: Self-pay | Admitting: *Deleted

## 2017-07-05 DIAGNOSIS — M25561 Pain in right knee: Secondary | ICD-10-CM | POA: Diagnosis not present

## 2017-07-05 DIAGNOSIS — M1711 Unilateral primary osteoarthritis, right knee: Secondary | ICD-10-CM | POA: Diagnosis not present

## 2017-07-10 ENCOUNTER — Encounter
Admission: RE | Disposition: A | Discharge status: Home / Self Care | Payer: Self-pay | Source: Ambulatory Visit | Attending: Ophthalmology

## 2017-07-10 ENCOUNTER — Other Ambulatory Visit: Payer: Self-pay

## 2017-07-10 ENCOUNTER — Ambulatory Visit
Admission: RE | Admit: 2017-07-10 | Discharge: 2017-07-10 | Disposition: A | Discharge status: Home / Self Care | Payer: Medicare HMO | Source: Ambulatory Visit | Attending: Ophthalmology | Admitting: Ophthalmology

## 2017-07-10 ENCOUNTER — Ambulatory Visit: Payer: Medicare HMO | Admitting: Certified Registered Nurse Anesthetist

## 2017-07-10 DIAGNOSIS — E1136 Type 2 diabetes mellitus with diabetic cataract: Secondary | ICD-10-CM | POA: Diagnosis present

## 2017-07-10 DIAGNOSIS — Z7984 Long term (current) use of oral hypoglycemic drugs: Secondary | ICD-10-CM | POA: Insufficient documentation

## 2017-07-10 DIAGNOSIS — H25011 Cortical age-related cataract, right eye: Secondary | ICD-10-CM | POA: Diagnosis not present

## 2017-07-10 DIAGNOSIS — Z791 Long term (current) use of non-steroidal anti-inflammatories (NSAID): Secondary | ICD-10-CM | POA: Diagnosis not present

## 2017-07-10 DIAGNOSIS — H2511 Age-related nuclear cataract, right eye: Secondary | ICD-10-CM | POA: Diagnosis not present

## 2017-07-10 DIAGNOSIS — Z79899 Other long term (current) drug therapy: Secondary | ICD-10-CM | POA: Diagnosis not present

## 2017-07-10 DIAGNOSIS — E7800 Pure hypercholesterolemia, unspecified: Secondary | ICD-10-CM | POA: Diagnosis not present

## 2017-07-10 DIAGNOSIS — Z9849 Cataract extraction status, unspecified eye: Secondary | ICD-10-CM | POA: Diagnosis not present

## 2017-07-10 DIAGNOSIS — H9190 Unspecified hearing loss, unspecified ear: Secondary | ICD-10-CM | POA: Insufficient documentation

## 2017-07-10 DIAGNOSIS — I10 Essential (primary) hypertension: Secondary | ICD-10-CM | POA: Insufficient documentation

## 2017-07-10 HISTORY — DX: Type 2 diabetes mellitus without complications: E11.9

## 2017-07-10 HISTORY — DX: Essential (primary) hypertension: I10

## 2017-07-10 HISTORY — PX: CATARACT EXTRACTION W/PHACO: SHX586

## 2017-07-10 HISTORY — DX: Unspecified hearing loss, unspecified ear: H91.90

## 2017-07-10 LAB — GLUCOSE, CAPILLARY: GLUCOSE-CAPILLARY: 106 mg/dL — AB (ref 65–99)

## 2017-07-10 SURGERY — PHACOEMULSIFICATION, CATARACT, WITH IOL INSERTION
Anesthesia: Monitor Anesthesia Care | Site: Eye | Laterality: Right | Wound class: Clean

## 2017-07-10 MED ORDER — MOXIFLOXACIN HCL 0.5 % OP SOLN
OPHTHALMIC | Status: DC | PRN
  Administered 2017-07-10: 0.2 mL via OPHTHALMIC

## 2017-07-10 MED ORDER — NA CHONDROIT SULF-NA HYALURON 40-17 MG/ML IO SOLN
INTRAOCULAR | Status: DC | PRN
  Administered 2017-07-10: 1 mL via INTRAOCULAR

## 2017-07-10 MED ORDER — MIDAZOLAM HCL 2 MG/2ML IJ SOLN
INTRAMUSCULAR | Status: DC | PRN
  Administered 2017-07-10: 0.5 mg via INTRAVENOUS
  Administered 2017-07-10: 1 mg via INTRAVENOUS
  Administered 2017-07-10: 0.5 mg via INTRAVENOUS

## 2017-07-10 MED ORDER — CARBACHOL 0.01 % IO SOLN
INTRAOCULAR | Status: DC | PRN
  Administered 2017-07-10: 0.5 mL via INTRAOCULAR

## 2017-07-10 MED ORDER — ARMC OPHTHALMIC DILATING DROPS
OPHTHALMIC | Status: AC
  Administered 2017-07-10: 1 via OPHTHALMIC
  Filled 2017-07-10: qty 0.4

## 2017-07-10 MED ORDER — MOXIFLOXACIN HCL 0.5 % OP SOLN
OPHTHALMIC | Status: AC
  Filled 2017-07-10: qty 3

## 2017-07-10 MED ORDER — SODIUM CHLORIDE 0.9 % IV SOLN
INTRAVENOUS | Status: DC
  Administered 2017-07-10: 07:00:00 via INTRAVENOUS

## 2017-07-10 MED ORDER — LIDOCAINE HCL (PF) 4 % IJ SOLN
INTRAOCULAR | Status: DC | PRN
  Administered 2017-07-10: 4 mL via OPHTHALMIC

## 2017-07-10 MED ORDER — POVIDONE-IODINE 5 % OP SOLN
OPHTHALMIC | Status: DC | PRN
  Administered 2017-07-10: 1 via OPHTHALMIC

## 2017-07-10 MED ORDER — MOXIFLOXACIN HCL 0.5 % OP SOLN: 1.0000 [drp] | OPHTHALMIC | Status: DC | PRN

## 2017-07-10 MED ORDER — BSS IO SOLN
INTRAOCULAR | Status: DC | PRN
  Administered 2017-07-10: 08:00:00 via OPHTHALMIC

## 2017-07-10 MED ORDER — ARMC OPHTHALMIC DILATING DROPS
1.0000 "application " | OPHTHALMIC | Status: AC
  Administered 2017-07-10 (×3): 1 via OPHTHALMIC

## 2017-07-10 MED ORDER — MIDAZOLAM HCL 2 MG/2ML IJ SOLN
INTRAMUSCULAR | Status: AC
  Filled 2017-07-10: qty 2

## 2017-07-10 SURGICAL SUPPLY — 16 items
GLOVE BIO SURGEON STRL SZ8 (GLOVE) ×2 IMPLANT
GLOVE BIOGEL M 6.5 STRL (GLOVE) ×2 IMPLANT
GLOVE SURG LX 8.0 MICRO (GLOVE) ×1
GLOVE SURG LX STRL 8.0 MICRO (GLOVE) ×1 IMPLANT
GOWN STRL REUS W/ TWL LRG LVL3 (GOWN DISPOSABLE) ×2 IMPLANT
GOWN STRL REUS W/TWL LRG LVL3 (GOWN DISPOSABLE) ×2
LABEL CATARACT MEDS ST (LABEL) ×2 IMPLANT
LENS IOL ACRYSOF IQ 19.5 (Intraocular Lens) ×2 IMPLANT
PACK CATARACT (MISCELLANEOUS) ×2 IMPLANT
PACK CATARACT BRASINGTON LX (MISCELLANEOUS) ×2 IMPLANT
PACK EYE AFTER SURG (MISCELLANEOUS) ×2 IMPLANT
SOL BSS BAG (MISCELLANEOUS) ×2
SOLUTION BSS BAG (MISCELLANEOUS) ×1 IMPLANT
SYR 5ML LL (SYRINGE) ×2 IMPLANT
WATER STERILE IRR 250ML POUR (IV SOLUTION) ×2 IMPLANT
WIPE NON LINTING 3.25X3.25 (MISCELLANEOUS) ×2 IMPLANT

## 2017-07-10 NOTE — Anesthesia Preprocedure Evaluation (Signed)
Anesthesia Evaluation  Patient identified by MRN, date of birth, ID band Patient awake    Reviewed: Allergy & Precautions, H&P , NPO status , Patient's Chart, lab work & pertinent test results, reviewed documented beta blocker date and time   History of Anesthesia Complications Negative for: history of anesthetic complications  Airway Mallampati: I  TM Distance: >3 FB Neck ROM: full    Dental  (+) Dental Advidsory Given, Teeth Intact, Partial Upper, Missing   Pulmonary neg pulmonary ROS,           Cardiovascular Exercise Tolerance: Good hypertension, (-) angina(-) CAD, (-) Past MI, (-) Cardiac Stents and (-) CABG (-) dysrhythmias (-) Valvular Problems/Murmurs     Neuro/Psych negative neurological ROS  negative psych ROS   GI/Hepatic negative GI ROS, Neg liver ROS,   Endo/Other  diabetes  Renal/GU negative Renal ROS  negative genitourinary   Musculoskeletal   Abdominal   Peds  Hematology negative hematology ROS (+)   Anesthesia Other Findings Past Medical History: No date: Diabetes mellitus without complication (HCC) No date: HOH (hard of hearing)     Comment:  AIDS No date: Hypertension   Reproductive/Obstetrics negative OB ROS                             Anesthesia Physical Anesthesia Plan  ASA: II  Anesthesia Plan: MAC   Post-op Pain Management:    Induction:   PONV Risk Score and Plan: 1 and Midazolam  Airway Management Planned: Nasal Cannula  Additional Equipment:   Intra-op Plan:   Post-operative Plan:   Informed Consent: I have reviewed the patients History and Physical, chart, labs and discussed the procedure including the risks, benefits and alternatives for the proposed anesthesia with the patient or authorized representative who has indicated his/her understanding and acceptance.   Dental Advisory Given  Plan Discussed with: Anesthesiologist, CRNA and  Surgeon  Anesthesia Plan Comments:         Anesthesia Quick Evaluation

## 2017-07-10 NOTE — H&P (Signed)
All labs reviewed. Abnormal studies sent to patients PCP when indicated.  Previous H&P reviewed, patient examined, there are NO CHANGES.  Keith Seago Porfilio3/5/20197:50 AM

## 2017-07-10 NOTE — Anesthesia Post-op Follow-up Note (Signed)
Anesthesia QCDR form completed.        

## 2017-07-10 NOTE — Op Note (Signed)
PREOPERATIVE DIAGNOSIS:  Nuclear sclerotic cataract of the right eye.   POSTOPERATIVE DIAGNOSIS:  Nuclear Sclerotic Cataract Right Eye   OPERATIVE PROCEDURE: Procedure(s): CATARACT EXTRACTION PHACO AND INTRAOCULAR LENS PLACEMENT (IOC)   SURGEON:  Birder Robson, MD.   ANESTHESIA:  Anesthesiologist: Martha Clan, MD CRNA: Eben Burow, CRNA  1.      Managed anesthesia care. 2.      0.78ml of Shugarcaine was instilled in the eye following the paracentesis.   COMPLICATIONS:  None.   TECHNIQUE:   Stop and chop   DESCRIPTION OF PROCEDURE:  The patient was examined and consented in the preoperative holding area where the aforementioned topical anesthesia was applied to the right eye and then brought back to the Operating Room where the right eye was prepped and draped in the usual sterile ophthalmic fashion and a lid speculum was placed. A paracentesis was created with the side port blade and the anterior chamber was filled with viscoelastic. A near clear corneal incision was performed with the steel keratome. A continuous curvilinear capsulorrhexis was performed with a cystotome followed by the capsulorrhexis forceps. Hydrodissection and hydrodelineation were carried out with BSS on a blunt cannula. The lens was removed in a stop and chop  technique and the remaining cortical material was removed with the irrigation-aspiration handpiece. The capsular bag was inflated with viscoelastic and the Technis [id number]  lens was placed in the capsular bag without complication. The remaining viscoelastic was removed from the eye with the irrigation-aspiration handpiece. The wounds were hydrated. The anterior chamber was flushed with Miostat and the eye was inflated to physiologic pressure. 0.10ml of Vigamox was placed in the anterior chamber. The wounds were found to be water tight. The eye was dressed with Vigamox. The patient was given protective glasses to wear throughout the day and a shield with which to  sleep tonight. The patient was also given drops with which to begin a drop regimen today and will follow-up with me in one day. Implant Name Type Inv. Item Serial No. Manufacturer Lot No. LRB No. Used  LENS IOL ACRYSOF IQ 19.5 - [id number] Intraocular Lens LENS IOL ACRYSOF IQ 19.5 [id number] ALCON  Right 1   Procedure(s) with comments: CATARACT EXTRACTION PHACO AND INTRAOCULAR LENS PLACEMENT (IOC) (Right) - [place] 00:29.2 AP% 16.5 CDE 4.83 Fluid Pack Lot # [id number]  Electronically signed: Birder Robson 07/10/2017 8:16 AM

## 2017-07-10 NOTE — Anesthesia Postprocedure Evaluation (Signed)
Anesthesia Post Note  Patient: Keith Howe  Procedure(s) Performed: CATARACT EXTRACTION PHACO AND INTRAOCULAR LENS PLACEMENT (IOC) (Right Eye)  Patient location during evaluation: PACU Anesthesia Type: MAC Level of consciousness: awake and alert Pain management: pain level controlled Vital Signs Assessment: post-procedure vital signs reviewed and stable Respiratory status: spontaneous breathing, nonlabored ventilation, respiratory function stable and patient connected to nasal cannula oxygen Cardiovascular status: stable and blood pressure returned to baseline Postop Assessment: no apparent nausea or vomiting Anesthetic complications: no     Last Vitals:  Vitals:   07/10/17 0628 07/10/17 0817  BP: (!) 155/79 118/66  Pulse: 91 83  Resp: 16 16  Temp: (!) 36.2 C 36.8 C  SpO2: 99% 100%    Last Pain:  Vitals:   07/10/17 0817  TempSrc: Oral                 Martha Clan

## 2017-07-10 NOTE — Transfer of Care (Signed)
Immediate Anesthesia Transfer of Care Note  Patient: Keith Howe  Procedure(s) Performed: CATARACT EXTRACTION PHACO AND INTRAOCULAR LENS PLACEMENT (IOC) (Right Eye)  Patient Location: Short Stay  Anesthesia Type:MAC  Level of Consciousness: awake, alert , oriented and patient cooperative  Airway & Oxygen Therapy: Patient Spontanous Breathing  Post-op Assessment: Report given to RN and Post -op Vital signs reviewed and stable  Post vital signs: Reviewed  Last Vitals:  Vitals:   07/10/17 0628  BP: (!) 155/79  Pulse: 91  Resp: 16  Temp: (!) 36.2 C  SpO2: 99%    Last Pain:  Vitals:   07/10/17 0628  TempSrc: Tympanic         Complications: No apparent anesthesia complications

## 2017-07-10 NOTE — Discharge Instructions (Signed)
Follow Dr. Inda Coke postop eye drop instruction sheet as reviewed.  Eye Surgery Discharge Instructions  Expect mild scratchy sensation or mild soreness. DO NOT RUB YOUR EYE!  The day of surgery:  Minimal physical activity, but bed rest is not required  No reading, computer work, or close hand work  No bending, lifting, or straining.  May watch TV  For 24 hours:  No driving, legal decisions, or alcoholic beverages  Safety precautions  Eat anything you prefer: It is better to start with liquids, then soup then solid foods.  _____ Eye patch should be worn until postoperative exam tomorrow.  ____ Solar shield eyeglasses should be worn for comfort in the sunlight/patch while sleeping  Resume all regular medications including aspirin or Coumadin if these were discontinued prior to surgery. You may shower, bathe, shave, or wash your hair. Tylenol may be taken for mild discomfort.  Call your doctor if you experience significant pain, nausea, or vomiting, fever > 101 or other signs of infection. [phone] or [phone] Specific instructions:  Follow-up Information    Birder Robson, MD Follow up.   Specialty:  Ophthalmology Why:  07/11/17 @ 2:15 PM Contact information: [address] [place] [address] [phone]

## 2017-07-12 DIAGNOSIS — M25561 Pain in right knee: Secondary | ICD-10-CM | POA: Diagnosis not present

## 2017-07-12 DIAGNOSIS — M1711 Unilateral primary osteoarthritis, right knee: Secondary | ICD-10-CM | POA: Diagnosis not present

## 2017-07-19 DIAGNOSIS — M1711 Unilateral primary osteoarthritis, right knee: Secondary | ICD-10-CM | POA: Diagnosis not present

## 2017-07-19 DIAGNOSIS — M25561 Pain in right knee: Secondary | ICD-10-CM | POA: Diagnosis not present

## 2017-07-26 DIAGNOSIS — I10 Essential (primary) hypertension: Secondary | ICD-10-CM | POA: Diagnosis not present

## 2017-07-26 DIAGNOSIS — E119 Type 2 diabetes mellitus without complications: Secondary | ICD-10-CM | POA: Diagnosis not present

## 2017-07-26 DIAGNOSIS — Z125 Encounter for screening for malignant neoplasm of prostate: Secondary | ICD-10-CM | POA: Diagnosis not present

## 2017-07-26 DIAGNOSIS — R5381 Other malaise: Secondary | ICD-10-CM | POA: Diagnosis not present

## 2017-07-26 DIAGNOSIS — I080 Rheumatic disorders of both mitral and aortic valves: Secondary | ICD-10-CM | POA: Diagnosis not present

## 2017-07-26 DIAGNOSIS — R011 Cardiac murmur, unspecified: Secondary | ICD-10-CM | POA: Diagnosis not present

## 2017-07-26 DIAGNOSIS — E7849 Other hyperlipidemia: Secondary | ICD-10-CM | POA: Diagnosis not present

## 2017-07-26 DIAGNOSIS — E8881 Metabolic syndrome: Secondary | ICD-10-CM | POA: Diagnosis not present

## 2017-08-03 DIAGNOSIS — E119 Type 2 diabetes mellitus without complications: Secondary | ICD-10-CM | POA: Diagnosis not present

## 2017-08-03 DIAGNOSIS — E8881 Metabolic syndrome: Secondary | ICD-10-CM | POA: Diagnosis not present

## 2017-08-03 DIAGNOSIS — I080 Rheumatic disorders of both mitral and aortic valves: Secondary | ICD-10-CM | POA: Diagnosis not present

## 2017-08-03 DIAGNOSIS — R011 Cardiac murmur, unspecified: Secondary | ICD-10-CM | POA: Diagnosis not present

## 2017-08-13 DIAGNOSIS — R69 Illness, unspecified: Secondary | ICD-10-CM | POA: Diagnosis not present

## 2017-08-23 DIAGNOSIS — Z961 Presence of intraocular lens: Secondary | ICD-10-CM | POA: Diagnosis not present

## 2017-09-03 DIAGNOSIS — D400 Neoplasm of uncertain behavior of prostate: Secondary | ICD-10-CM | POA: Diagnosis not present

## 2017-09-03 DIAGNOSIS — R351 Nocturia: Secondary | ICD-10-CM | POA: Diagnosis not present

## 2017-09-03 DIAGNOSIS — N401 Enlarged prostate with lower urinary tract symptoms: Secondary | ICD-10-CM | POA: Diagnosis not present

## 2017-09-25 DIAGNOSIS — E785 Hyperlipidemia, unspecified: Secondary | ICD-10-CM | POA: Diagnosis not present

## 2017-09-25 DIAGNOSIS — I7389 Other specified peripheral vascular diseases: Secondary | ICD-10-CM | POA: Diagnosis not present

## 2017-09-25 DIAGNOSIS — E669 Obesity, unspecified: Secondary | ICD-10-CM | POA: Diagnosis not present

## 2017-09-25 DIAGNOSIS — N401 Enlarged prostate with lower urinary tract symptoms: Secondary | ICD-10-CM | POA: Diagnosis not present

## 2017-09-25 DIAGNOSIS — N183 Chronic kidney disease, stage 3 (moderate): Secondary | ICD-10-CM | POA: Diagnosis not present

## 2017-09-25 DIAGNOSIS — Z6831 Body mass index (BMI) 31.0-31.9, adult: Secondary | ICD-10-CM | POA: Diagnosis not present

## 2017-09-25 DIAGNOSIS — I10 Essential (primary) hypertension: Secondary | ICD-10-CM | POA: Diagnosis not present

## 2017-09-25 DIAGNOSIS — E790 Hyperuricemia without signs of inflammatory arthritis and tophaceous disease: Secondary | ICD-10-CM | POA: Diagnosis not present

## 2017-09-25 DIAGNOSIS — E1065 Type 1 diabetes mellitus with hyperglycemia: Secondary | ICD-10-CM | POA: Diagnosis not present

## 2017-10-03 DIAGNOSIS — N183 Chronic kidney disease, stage 3 (moderate): Secondary | ICD-10-CM | POA: Diagnosis not present

## 2017-10-03 DIAGNOSIS — N401 Enlarged prostate with lower urinary tract symptoms: Secondary | ICD-10-CM | POA: Diagnosis not present

## 2017-10-03 DIAGNOSIS — E1065 Type 1 diabetes mellitus with hyperglycemia: Secondary | ICD-10-CM | POA: Diagnosis not present

## 2017-10-03 DIAGNOSIS — I7389 Other specified peripheral vascular diseases: Secondary | ICD-10-CM | POA: Diagnosis not present

## 2017-10-03 DIAGNOSIS — E790 Hyperuricemia without signs of inflammatory arthritis and tophaceous disease: Secondary | ICD-10-CM | POA: Diagnosis not present

## 2017-10-03 DIAGNOSIS — I10 Essential (primary) hypertension: Secondary | ICD-10-CM | POA: Diagnosis not present

## 2017-10-03 DIAGNOSIS — E785 Hyperlipidemia, unspecified: Secondary | ICD-10-CM | POA: Diagnosis not present

## 2017-10-13 DIAGNOSIS — R69 Illness, unspecified: Secondary | ICD-10-CM | POA: Diagnosis not present

## 2017-10-30 DIAGNOSIS — E119 Type 2 diabetes mellitus without complications: Secondary | ICD-10-CM | POA: Diagnosis not present

## 2017-10-30 DIAGNOSIS — R011 Cardiac murmur, unspecified: Secondary | ICD-10-CM | POA: Diagnosis not present

## 2017-10-30 DIAGNOSIS — I080 Rheumatic disorders of both mitral and aortic valves: Secondary | ICD-10-CM | POA: Diagnosis not present

## 2017-10-30 DIAGNOSIS — E785 Hyperlipidemia, unspecified: Secondary | ICD-10-CM | POA: Diagnosis not present

## 2017-11-02 DIAGNOSIS — E1065 Type 1 diabetes mellitus with hyperglycemia: Secondary | ICD-10-CM | POA: Diagnosis not present

## 2017-11-15 DIAGNOSIS — I10 Essential (primary) hypertension: Secondary | ICD-10-CM | POA: Diagnosis not present

## 2017-11-15 DIAGNOSIS — E1065 Type 1 diabetes mellitus with hyperglycemia: Secondary | ICD-10-CM | POA: Diagnosis not present

## 2017-11-15 DIAGNOSIS — E669 Obesity, unspecified: Secondary | ICD-10-CM | POA: Diagnosis not present

## 2017-11-15 DIAGNOSIS — N401 Enlarged prostate with lower urinary tract symptoms: Secondary | ICD-10-CM | POA: Diagnosis not present

## 2017-11-15 DIAGNOSIS — N183 Chronic kidney disease, stage 3 (moderate): Secondary | ICD-10-CM | POA: Diagnosis not present

## 2017-11-15 DIAGNOSIS — Z6832 Body mass index (BMI) 32.0-32.9, adult: Secondary | ICD-10-CM | POA: Diagnosis not present

## 2017-11-15 DIAGNOSIS — E790 Hyperuricemia without signs of inflammatory arthritis and tophaceous disease: Secondary | ICD-10-CM | POA: Diagnosis not present

## 2017-11-15 DIAGNOSIS — E785 Hyperlipidemia, unspecified: Secondary | ICD-10-CM | POA: Diagnosis not present

## 2017-11-15 DIAGNOSIS — I7389 Other specified peripheral vascular diseases: Secondary | ICD-10-CM | POA: Diagnosis not present

## 2017-12-17 DIAGNOSIS — M1711 Unilateral primary osteoarthritis, right knee: Secondary | ICD-10-CM | POA: Diagnosis not present

## 2017-12-17 DIAGNOSIS — M1712 Unilateral primary osteoarthritis, left knee: Secondary | ICD-10-CM | POA: Diagnosis not present

## 2017-12-17 DIAGNOSIS — M170 Bilateral primary osteoarthritis of knee: Secondary | ICD-10-CM | POA: Diagnosis not present

## 2018-01-07 DIAGNOSIS — R69 Illness, unspecified: Secondary | ICD-10-CM | POA: Diagnosis not present

## 2018-01-29 DIAGNOSIS — Z23 Encounter for immunization: Secondary | ICD-10-CM | POA: Diagnosis not present

## 2018-01-29 DIAGNOSIS — R011 Cardiac murmur, unspecified: Secondary | ICD-10-CM | POA: Diagnosis not present

## 2018-01-29 DIAGNOSIS — E785 Hyperlipidemia, unspecified: Secondary | ICD-10-CM | POA: Diagnosis not present

## 2018-01-29 DIAGNOSIS — E119 Type 2 diabetes mellitus without complications: Secondary | ICD-10-CM | POA: Diagnosis not present

## 2018-01-29 DIAGNOSIS — I080 Rheumatic disorders of both mitral and aortic valves: Secondary | ICD-10-CM | POA: Diagnosis not present

## 2018-02-08 DIAGNOSIS — M1712 Unilateral primary osteoarthritis, left knee: Secondary | ICD-10-CM | POA: Diagnosis not present

## 2018-02-13 ENCOUNTER — Other Ambulatory Visit: Payer: Self-pay | Admitting: Orthopedic Surgery

## 2018-02-14 DIAGNOSIS — I10 Essential (primary) hypertension: Secondary | ICD-10-CM | POA: Diagnosis not present

## 2018-02-14 DIAGNOSIS — N183 Chronic kidney disease, stage 3 (moderate): Secondary | ICD-10-CM | POA: Diagnosis not present

## 2018-02-14 DIAGNOSIS — N401 Enlarged prostate with lower urinary tract symptoms: Secondary | ICD-10-CM | POA: Diagnosis not present

## 2018-02-14 DIAGNOSIS — E1065 Type 1 diabetes mellitus with hyperglycemia: Secondary | ICD-10-CM | POA: Diagnosis not present

## 2018-02-14 DIAGNOSIS — E785 Hyperlipidemia, unspecified: Secondary | ICD-10-CM | POA: Diagnosis not present

## 2018-02-14 DIAGNOSIS — R69 Illness, unspecified: Secondary | ICD-10-CM | POA: Diagnosis not present

## 2018-02-14 DIAGNOSIS — E790 Hyperuricemia without signs of inflammatory arthritis and tophaceous disease: Secondary | ICD-10-CM | POA: Diagnosis not present

## 2018-02-14 DIAGNOSIS — I7389 Other specified peripheral vascular diseases: Secondary | ICD-10-CM | POA: Diagnosis not present

## 2018-02-22 DIAGNOSIS — E119 Type 2 diabetes mellitus without complications: Secondary | ICD-10-CM | POA: Diagnosis not present

## 2018-03-06 ENCOUNTER — Encounter
Admission: RE | Admit: 2018-03-06 | Discharge: 2018-03-06 | Disposition: A | Discharge status: Home / Self Care | Payer: Medicare HMO | Source: Ambulatory Visit | Attending: Orthopedic Surgery | Admitting: Orthopedic Surgery

## 2018-03-06 ENCOUNTER — Ambulatory Visit
Admission: RE | Admit: 2018-03-06 | Discharge: 2018-03-06 | Disposition: A | Discharge status: Home / Self Care | Payer: Medicare HMO | Source: Ambulatory Visit | Attending: Orthopedic Surgery | Admitting: Orthopedic Surgery

## 2018-03-06 ENCOUNTER — Other Ambulatory Visit: Payer: Self-pay

## 2018-03-06 DIAGNOSIS — N5201 Erectile dysfunction due to arterial insufficiency: Secondary | ICD-10-CM | POA: Diagnosis not present

## 2018-03-06 DIAGNOSIS — Z01818 Encounter for other preprocedural examination: Secondary | ICD-10-CM

## 2018-03-06 DIAGNOSIS — Z01810 Encounter for preprocedural cardiovascular examination: Secondary | ICD-10-CM | POA: Diagnosis not present

## 2018-03-06 DIAGNOSIS — Z0389 Encounter for observation for other suspected diseases and conditions ruled out: Secondary | ICD-10-CM | POA: Diagnosis not present

## 2018-03-06 DIAGNOSIS — D400 Neoplasm of uncertain behavior of prostate: Secondary | ICD-10-CM | POA: Diagnosis not present

## 2018-03-06 DIAGNOSIS — N401 Enlarged prostate with lower urinary tract symptoms: Secondary | ICD-10-CM | POA: Diagnosis not present

## 2018-03-06 HISTORY — DX: Benign prostatic hyperplasia without lower urinary tract symptoms: N40.0

## 2018-03-06 HISTORY — DX: Pain, unspecified: R52

## 2018-03-06 LAB — URINALYSIS, COMPLETE (UACMP) WITH MICROSCOPIC
BACTERIA UA: NONE SEEN
Bilirubin Urine: NEGATIVE
GLUCOSE, UA: NEGATIVE mg/dL
Ketones, ur: NEGATIVE mg/dL
Leukocytes, UA: NEGATIVE
Nitrite: NEGATIVE
PROTEIN: NEGATIVE mg/dL
Specific Gravity, Urine: 1.011 (ref 1.005–1.030)
Squamous Epithelial / LPF: NONE SEEN (ref 0–5)
pH: 5 (ref 5.0–8.0)

## 2018-03-06 LAB — BASIC METABOLIC PANEL
ANION GAP: 7 (ref 5–15)
BUN: 28 mg/dL — ABNORMAL HIGH (ref 8–23)
CHLORIDE: 106 mmol/L (ref 98–111)
CO2: 28 mmol/L (ref 22–32)
CREATININE: 1.71 mg/dL — AB (ref 0.61–1.24)
Calcium: 9.3 mg/dL (ref 8.9–10.3)
GFR calc non Af Amer: 37 mL/min — ABNORMAL LOW (ref >60)
GFR, EST AFRICAN AMERICAN: 43 mL/min — AB (ref >60)
Glucose, Bld: 149 mg/dL — ABNORMAL HIGH (ref 70–99)
Potassium: 3.9 mmol/L (ref 3.5–5.1)
SODIUM: 141 mmol/L (ref 135–145)

## 2018-03-06 LAB — CBC WITH DIFFERENTIAL/PLATELET
Abs Immature Granulocytes: 0.03 10*3/uL (ref 0.00–0.07)
BASOS ABS: 0 10*3/uL (ref 0.0–0.1)
Basophils Relative: 0 %
EOS PCT: 2 %
Eosinophils Absolute: 0.1 10*3/uL (ref 0.0–0.5)
HCT: 42.9 % (ref 39.0–52.0)
Hemoglobin: 14.1 g/dL (ref 13.0–17.0)
Immature Granulocytes: 1 %
LYMPHS PCT: 17 %
Lymphs Abs: 0.9 10*3/uL (ref 0.7–4.0)
MCH: 27.5 pg (ref 26.0–34.0)
MCHC: 32.9 g/dL (ref 30.0–36.0)
MCV: 83.8 fL (ref 80.0–100.0)
Monocytes Absolute: 0.4 10*3/uL (ref 0.1–1.0)
Monocytes Relative: 8 %
NRBC: 0 % (ref 0.0–0.2)
Neutro Abs: 3.5 10*3/uL (ref 1.7–7.7)
Neutrophils Relative %: 72 %
Platelets: 179 10*3/uL (ref 150–400)
RBC: 5.12 MIL/uL (ref 4.22–5.81)
RDW: 12.8 % (ref 11.5–15.5)
WBC: 4.9 10*3/uL (ref 4.0–10.5)

## 2018-03-06 LAB — SURGICAL PCR SCREEN
MRSA, PCR: NEGATIVE
Staphylococcus aureus: NEGATIVE

## 2018-03-06 LAB — TYPE AND SCREEN
ABO/RH(D): B POS
ANTIBODY SCREEN: NEGATIVE

## 2018-03-06 LAB — PROTIME-INR
INR: 1.01
Prothrombin Time: 13.2 seconds (ref 11.4–15.2)

## 2018-03-06 LAB — HEMOGLOBIN A1C
HEMOGLOBIN A1C: 7.3 % — AB (ref 4.8–5.6)
MEAN PLASMA GLUCOSE: 162.81 mg/dL

## 2018-03-06 LAB — APTT: aPTT: 30 seconds (ref 24–36)

## 2018-03-06 NOTE — Patient Instructions (Signed)
Your procedure is scheduled on: 03/19/18 Report to Day Surgery. [facility]OND FLOOR To find out your arrival time please call [phone] between 1PM - 3PM on  03/18/18  Remember: Instructions that are not followed completely may result in serious medical risk,  up to and including death, or upon the discretion of your surgeon and anesthesiologist your  surgery may need to be rescheduled.     _X__ 1. Do not eat food after midnight the night before your procedure.                 No gum chewing or hard candies. You may drink clear liquids up to 2 hours                 before you are scheduled to arrive for your surgery- DO not drink clear                 liquids within 2 hours of the start of your surgery.                 Clear Liquids include:  water, apple juice without pulp, clear carbohydrate                 drink such as Clearfast of Gatorade, Black Coffee or Tea (Do not add                 anything to coffee or tea).  __X__2.  On the morning of surgery brush your teeth with toothpaste and water, you                may rinse your mouth with mouthwash if you wish.  Do not swallow any toothpaste of mouthwash.     _X__ 3.  No Alcohol for 24 hours before or after surgery.   _X__ 4.  Do Not Smoke or use e-cigarettes For 24 Hours Prior to Your Surgery.                 Do not use any chewable tobacco products for at least 6 hours prior to                 surgery.  ____  5.  Bring all medications with you on the day of surgery if instructed.   _X___  6.  Notify your doctor if there is any change in your medical condition      (cold, fever, infections).     Do not wear jewelry, make-up, hairpins, clips or nail polish. Do not wear lotions, powders, or perfumes. You may wear deodorant. Do not shave 48 hours prior to surgery. Men may shave face and neck. Do not bring valuables to the hospital.    [facility] is not responsible for any belongings or  valuables.  Contacts, dentures or bridgework may not be worn into surgery. Leave your suitcase in the car. After surgery it may be brought to your room. For patients admitted to the hospital, discharge time is determined by your treatment team.   Patients discharged the day of surgery will not be allowed to drive home.   Please read over the following fact sheets that you were given:   Surgical Site Infection Prevention/ MRSA   __X_ Take these medicines the morning of surgery with A SIP OF WATER:    1. TAMSULOSIN  2. DUTASLERIDE  3. METOPROLOL  4. LOVASTATIN  5.  6.  ____ Fleet Enema (as directed)   __X__ Use CHG Soap  as directed  ____ Use inhalers on the day of surgery  ____ Stop metformin 2 days prior to surgery    _X___ . No insulin the morning          of surgery.   __X__ Stop Coumadin/Plavix/aspirin on      STOP ASPIRIN 7 DAYS BEFORE SURGERY ____ Stop Anti-inflammatories on    __X__ Stop supplements until after surgery.   STOP PROSTATE SUPPLEMENT AND FISH OIL NOW UNTIL AFTER SURGERY  ____ Bring C-Pap to the hospital.

## 2018-03-08 DIAGNOSIS — M25662 Stiffness of left knee, not elsewhere classified: Secondary | ICD-10-CM | POA: Diagnosis not present

## 2018-03-08 DIAGNOSIS — M25562 Pain in left knee: Secondary | ICD-10-CM | POA: Diagnosis not present

## 2018-03-13 DIAGNOSIS — E119 Type 2 diabetes mellitus without complications: Secondary | ICD-10-CM | POA: Diagnosis not present

## 2018-03-13 DIAGNOSIS — R011 Cardiac murmur, unspecified: Secondary | ICD-10-CM | POA: Diagnosis not present

## 2018-03-13 DIAGNOSIS — E8881 Metabolic syndrome: Secondary | ICD-10-CM | POA: Diagnosis not present

## 2018-03-13 DIAGNOSIS — E785 Hyperlipidemia, unspecified: Secondary | ICD-10-CM | POA: Diagnosis not present

## 2018-03-13 NOTE — Pre-Procedure Instructions (Signed)
CLEARED LOW RISK BY DR MASOUD ON CHART

## 2018-03-14 DIAGNOSIS — I10 Essential (primary) hypertension: Secondary | ICD-10-CM | POA: Diagnosis not present

## 2018-03-14 DIAGNOSIS — E1065 Type 1 diabetes mellitus with hyperglycemia: Secondary | ICD-10-CM | POA: Diagnosis not present

## 2018-03-14 DIAGNOSIS — E785 Hyperlipidemia, unspecified: Secondary | ICD-10-CM | POA: Diagnosis not present

## 2018-03-14 DIAGNOSIS — I7389 Other specified peripheral vascular diseases: Secondary | ICD-10-CM | POA: Diagnosis not present

## 2018-03-14 DIAGNOSIS — N183 Chronic kidney disease, stage 3 (moderate): Secondary | ICD-10-CM | POA: Diagnosis not present

## 2018-03-18 MED ORDER — CLINDAMYCIN PHOSPHATE 900 MG/50ML IV SOLN
900.0000 mg | Freq: Once | INTRAVENOUS | Status: AC
  Administered 2018-03-19: 900 mg via INTRAVENOUS

## 2018-03-18 MED ORDER — CEFAZOLIN SODIUM-DEXTROSE 2-4 GM/100ML-% IV SOLN
2.0000 g | INTRAVENOUS | Status: AC
  Administered 2018-03-19: 2 g via INTRAVENOUS

## 2018-03-19 ENCOUNTER — Inpatient Hospital Stay
Admission: RE | Admit: 2018-03-19 | Discharge: 2018-03-24 | DRG: 470 | Disposition: A | Discharge status: Home Health | Payer: Medicare HMO | Attending: Internal Medicine | Admitting: Internal Medicine

## 2018-03-19 ENCOUNTER — Inpatient Hospital Stay: Payer: Medicare HMO | Admitting: Anesthesiology

## 2018-03-19 ENCOUNTER — Inpatient Hospital Stay: Payer: Medicare HMO

## 2018-03-19 ENCOUNTER — Encounter: Payer: Self-pay | Admitting: *Deleted

## 2018-03-19 ENCOUNTER — Encounter
Admission: RE | Disposition: A | Discharge status: Home Health | Payer: Self-pay | Source: Home / Self Care | Attending: Orthopedic Surgery

## 2018-03-19 ENCOUNTER — Other Ambulatory Visit: Payer: Self-pay

## 2018-03-19 DIAGNOSIS — D62 Acute posthemorrhagic anemia: Secondary | ICD-10-CM | POA: Diagnosis not present

## 2018-03-19 DIAGNOSIS — Y92239 Unspecified place in hospital as the place of occurrence of the external cause: Secondary | ICD-10-CM | POA: Diagnosis present

## 2018-03-19 DIAGNOSIS — Z7982 Long term (current) use of aspirin: Secondary | ICD-10-CM | POA: Diagnosis not present

## 2018-03-19 DIAGNOSIS — E1122 Type 2 diabetes mellitus with diabetic chronic kidney disease: Secondary | ICD-10-CM | POA: Diagnosis not present

## 2018-03-19 DIAGNOSIS — L089 Local infection of the skin and subcutaneous tissue, unspecified: Secondary | ICD-10-CM | POA: Diagnosis not present

## 2018-03-19 DIAGNOSIS — Z96652 Presence of left artificial knee joint: Secondary | ICD-10-CM | POA: Diagnosis not present

## 2018-03-19 DIAGNOSIS — Z9841 Cataract extraction status, right eye: Secondary | ICD-10-CM | POA: Diagnosis not present

## 2018-03-19 DIAGNOSIS — I129 Hypertensive chronic kidney disease with stage 1 through stage 4 chronic kidney disease, or unspecified chronic kidney disease: Secondary | ICD-10-CM | POA: Diagnosis present

## 2018-03-19 DIAGNOSIS — S80822A Blister (nonthermal), left lower leg, initial encounter: Secondary | ICD-10-CM | POA: Diagnosis not present

## 2018-03-19 DIAGNOSIS — G8929 Other chronic pain: Secondary | ICD-10-CM | POA: Diagnosis present

## 2018-03-19 DIAGNOSIS — I9581 Postprocedural hypotension: Secondary | ICD-10-CM | POA: Diagnosis not present

## 2018-03-19 DIAGNOSIS — N179 Acute kidney failure, unspecified: Secondary | ICD-10-CM

## 2018-03-19 DIAGNOSIS — E119 Type 2 diabetes mellitus without complications: Secondary | ICD-10-CM | POA: Diagnosis not present

## 2018-03-19 DIAGNOSIS — M1990 Unspecified osteoarthritis, unspecified site: Secondary | ICD-10-CM | POA: Diagnosis not present

## 2018-03-19 DIAGNOSIS — N183 Chronic kidney disease, stage 3 (moderate): Secondary | ICD-10-CM | POA: Diagnosis not present

## 2018-03-19 DIAGNOSIS — I959 Hypotension, unspecified: Secondary | ICD-10-CM | POA: Diagnosis not present

## 2018-03-19 DIAGNOSIS — B20 Human immunodeficiency virus [HIV] disease: Secondary | ICD-10-CM | POA: Diagnosis present

## 2018-03-19 DIAGNOSIS — R69 Illness, unspecified: Secondary | ICD-10-CM | POA: Diagnosis not present

## 2018-03-19 DIAGNOSIS — R809 Proteinuria, unspecified: Secondary | ICD-10-CM | POA: Diagnosis not present

## 2018-03-19 DIAGNOSIS — Z961 Presence of intraocular lens: Secondary | ICD-10-CM | POA: Diagnosis present

## 2018-03-19 DIAGNOSIS — Z23 Encounter for immunization: Secondary | ICD-10-CM

## 2018-03-19 DIAGNOSIS — M1712 Unilateral primary osteoarthritis, left knee: Secondary | ICD-10-CM | POA: Diagnosis not present

## 2018-03-19 DIAGNOSIS — N400 Enlarged prostate without lower urinary tract symptoms: Secondary | ICD-10-CM | POA: Diagnosis present

## 2018-03-19 DIAGNOSIS — I10 Essential (primary) hypertension: Secondary | ICD-10-CM | POA: Diagnosis not present

## 2018-03-19 DIAGNOSIS — E860 Dehydration: Secondary | ICD-10-CM | POA: Diagnosis present

## 2018-03-19 DIAGNOSIS — Z96651 Presence of right artificial knee joint: Secondary | ICD-10-CM | POA: Diagnosis not present

## 2018-03-19 DIAGNOSIS — Z79899 Other long term (current) drug therapy: Secondary | ICD-10-CM

## 2018-03-19 DIAGNOSIS — H9190 Unspecified hearing loss, unspecified ear: Secondary | ICD-10-CM | POA: Diagnosis present

## 2018-03-19 DIAGNOSIS — X58XXXA Exposure to other specified factors, initial encounter: Secondary | ICD-10-CM | POA: Diagnosis not present

## 2018-03-19 DIAGNOSIS — Z794 Long term (current) use of insulin: Secondary | ICD-10-CM

## 2018-03-19 DIAGNOSIS — M545 Low back pain: Secondary | ICD-10-CM | POA: Diagnosis present

## 2018-03-19 DIAGNOSIS — N189 Chronic kidney disease, unspecified: Secondary | ICD-10-CM | POA: Diagnosis not present

## 2018-03-19 HISTORY — PX: TOTAL KNEE ARTHROPLASTY: SHX125

## 2018-03-19 LAB — CBC
HCT: 38.6 % — ABNORMAL LOW (ref 39.0–52.0)
HEMOGLOBIN: 12.3 g/dL — AB (ref 13.0–17.0)
MCH: 27.6 pg (ref 26.0–34.0)
MCHC: 31.9 g/dL (ref 30.0–36.0)
MCV: 86.7 fL (ref 80.0–100.0)
PLATELETS: 163 10*3/uL (ref 150–400)
RBC: 4.45 MIL/uL (ref 4.22–5.81)
RDW: 13 % (ref 11.5–15.5)
WBC: 5.2 10*3/uL (ref 4.0–10.5)
nRBC: 0 % (ref 0.0–0.2)

## 2018-03-19 LAB — CREATININE, SERUM
CREATININE: 1.65 mg/dL — AB (ref 0.61–1.24)
GFR calc Af Amer: 45 mL/min — ABNORMAL LOW (ref >60)
GFR, EST NON AFRICAN AMERICAN: 39 mL/min — AB (ref >60)

## 2018-03-19 LAB — GLUCOSE, CAPILLARY
GLUCOSE-CAPILLARY: 84 mg/dL (ref 70–99)
GLUCOSE-CAPILLARY: 83 mg/dL (ref 70–99)
GLUCOSE-CAPILLARY: 267 mg/dL — AB (ref 70–99)
Glucose-Capillary: 57 mg/dL — ABNORMAL LOW (ref 70–99)
Glucose-Capillary: 62 mg/dL — ABNORMAL LOW (ref 70–99)
Glucose-Capillary: 121 mg/dL — ABNORMAL HIGH (ref 70–99)
Glucose-Capillary: 55 mg/dL — ABNORMAL LOW (ref 70–99)
Glucose-Capillary: 189 mg/dL — ABNORMAL HIGH (ref 70–99)

## 2018-03-19 LAB — ABO/RH: ABO/RH(D): B POS

## 2018-03-19 SURGERY — ARTHROPLASTY, KNEE, TOTAL
Anesthesia: Spinal | Laterality: Left

## 2018-03-19 MED ORDER — ONDANSETRON HCL 4 MG PO TABS: 4.0000 mg | ORAL_TABLET | Freq: Four times a day (QID) | ORAL | Status: DC | PRN

## 2018-03-19 MED ORDER — PRAVASTATIN SODIUM 20 MG PO TABS
40.0000 mg | ORAL_TABLET | Freq: Every day | ORAL | Status: DC
  Administered 2018-03-20 – 2018-03-23 (×4): 40 mg via ORAL
  Filled 2018-03-19 (×4): qty 2

## 2018-03-19 MED ORDER — INSULIN ASPART 100 UNIT/ML ~~LOC~~ SOLN: 0.0000 [IU] | Freq: Three times a day (TID) | SUBCUTANEOUS | Status: DC

## 2018-03-19 MED ORDER — MAGNESIUM CITRATE PO SOLN
1.0000 | Freq: Once | ORAL | Status: AC | PRN
  Administered 2018-03-22: 1 via ORAL
  Filled 2018-03-19: qty 296

## 2018-03-19 MED ORDER — PROPOFOL 10 MG/ML IV BOLUS
INTRAVENOUS | Status: DC | PRN
  Administered 2018-03-19: 20 mg via INTRAVENOUS

## 2018-03-19 MED ORDER — PNEUMOCOCCAL VAC POLYVALENT 25 MCG/0.5ML IJ INJ
0.5000 mL | INJECTION | INTRAMUSCULAR | Status: AC
  Administered 2018-03-22: 0.5 mL via INTRAMUSCULAR
  Filled 2018-03-19: qty 0.5

## 2018-03-19 MED ORDER — ONDANSETRON HCL 4 MG/2ML IJ SOLN: 4.0000 mg | Freq: Four times a day (QID) | INTRAMUSCULAR | Status: DC | PRN

## 2018-03-19 MED ORDER — VITAMIN D 25 MCG (1000 UNIT) PO TABS
1000.0000 [IU] | ORAL_TABLET | Freq: Every day | ORAL | Status: DC
  Administered 2018-03-19 – 2018-03-24 (×6): 1000 [IU] via ORAL
  Filled 2018-03-19 (×4): qty 1

## 2018-03-19 MED ORDER — FENTANYL CITRATE (PF) 100 MCG/2ML IJ SOLN: 25.0000 ug | INTRAMUSCULAR | Status: DC | PRN

## 2018-03-19 MED ORDER — INSULIN GLARGINE 100 UNIT/ML ~~LOC~~ SOLN
40.0000 [IU] | Freq: Every day | SUBCUTANEOUS | Status: DC
  Administered 2018-03-20 – 2018-03-21 (×2): 40 [IU] via SUBCUTANEOUS
  Filled 2018-03-19 (×3): qty 0.4

## 2018-03-19 MED ORDER — MIDAZOLAM HCL 5 MG/5ML IJ SOLN
INTRAMUSCULAR | Status: DC | PRN
  Administered 2018-03-19 (×2): 1 mg via INTRAVENOUS

## 2018-03-19 MED ORDER — CHLORHEXIDINE GLUCONATE CLOTH 2 % EX PADS: 6.0000 | MEDICATED_PAD | Freq: Once | CUTANEOUS | Status: DC

## 2018-03-19 MED ORDER — PROPOFOL 500 MG/50ML IV EMUL
INTRAVENOUS | Status: AC
  Filled 2018-03-19: qty 50

## 2018-03-19 MED ORDER — TRAMADOL HCL 50 MG PO TABS
50.0000 mg | ORAL_TABLET | Freq: Four times a day (QID) | ORAL | Status: DC
  Administered 2018-03-19 – 2018-03-24 (×17): 50 mg via ORAL
  Filled 2018-03-19 (×18): qty 1

## 2018-03-19 MED ORDER — OXYCODONE HCL 5 MG PO TABS: 10.0000 mg | ORAL_TABLET | ORAL | Status: DC | PRN

## 2018-03-19 MED ORDER — SODIUM CHLORIDE 0.9 % IV SOLN
INTRAVENOUS | Status: DC | PRN
  Administered 2018-03-19: 10 ug/min via INTRAVENOUS

## 2018-03-19 MED ORDER — FAMOTIDINE 20 MG PO TABS
ORAL_TABLET | ORAL | Status: AC
  Administered 2018-03-19: 20 mg via ORAL
  Filled 2018-03-19: qty 1

## 2018-03-19 MED ORDER — ALUM & MAG HYDROXIDE-SIMETH 200-200-20 MG/5ML PO SUSP: 30.0000 mL | ORAL | Status: DC | PRN

## 2018-03-19 MED ORDER — METOCLOPRAMIDE HCL 5 MG/ML IJ SOLN: 5.0000 mg | Freq: Three times a day (TID) | INTRAMUSCULAR | Status: DC | PRN

## 2018-03-19 MED ORDER — HYDROMORPHONE HCL 1 MG/ML IJ SOLN: 0.5000 mg | INTRAMUSCULAR | Status: DC | PRN

## 2018-03-19 MED ORDER — BUPIVACAINE-EPINEPHRINE (PF) 0.25% -1:200000 IJ SOLN
INTRAMUSCULAR | Status: AC
  Filled 2018-03-19: qty 60

## 2018-03-19 MED ORDER — SODIUM CHLORIDE 0.9 % IV BOLUS
500.0000 mL | Freq: Once | INTRAVENOUS | Status: AC
  Administered 2018-03-19: 500 mL via INTRAVENOUS

## 2018-03-19 MED ORDER — NEOMYCIN-POLYMYXIN B GU 40-200000 IR SOLN
Status: DC | PRN
  Administered 2018-03-19: 16 mL

## 2018-03-19 MED ORDER — MORPHINE SULFATE (PF) 4 MG/ML IV SOLN
INTRAVENOUS | Status: AC
  Filled 2018-03-19: qty 1

## 2018-03-19 MED ORDER — DULAGLUTIDE 1.5 MG/0.5ML ~~LOC~~ SOAJ: 1.5000 mg | SUBCUTANEOUS | Status: DC

## 2018-03-19 MED ORDER — EPHEDRINE SULFATE 50 MG/ML IJ SOLN
INTRAMUSCULAR | Status: AC
  Filled 2018-03-19: qty 1

## 2018-03-19 MED ORDER — FENTANYL CITRATE (PF) 100 MCG/2ML IJ SOLN
INTRAMUSCULAR | Status: AC
  Filled 2018-03-19: qty 2

## 2018-03-19 MED ORDER — DUTASTERIDE 0.5 MG PO CAPS
0.5000 mg | ORAL_CAPSULE | Freq: Every day | ORAL | Status: DC
  Administered 2018-03-20 – 2018-03-24 (×4): 0.5 mg via ORAL
  Filled 2018-03-19 (×6): qty 1

## 2018-03-19 MED ORDER — MIDAZOLAM HCL 2 MG/2ML IJ SOLN
INTRAMUSCULAR | Status: AC
  Filled 2018-03-19: qty 2

## 2018-03-19 MED ORDER — INSULIN ASPART 100 UNIT/ML ~~LOC~~ SOLN: 0.0000 [IU] | Freq: Every day | SUBCUTANEOUS | Status: DC

## 2018-03-19 MED ORDER — GABAPENTIN 300 MG PO CAPS
300.0000 mg | ORAL_CAPSULE | Freq: Three times a day (TID) | ORAL | Status: DC
  Administered 2018-03-19 – 2018-03-24 (×15): 300 mg via ORAL
  Filled 2018-03-19 (×16): qty 1

## 2018-03-19 MED ORDER — BISACODYL 5 MG PO TBEC
10.0000 mg | DELAYED_RELEASE_TABLET | Freq: Every day | ORAL | Status: DC | PRN
  Administered 2018-03-21: 10 mg via ORAL
  Filled 2018-03-19: qty 2

## 2018-03-19 MED ORDER — INSULIN GLARGINE 100 UNIT/ML ~~LOC~~ SOLN
80.0000 [IU] | SUBCUTANEOUS | Status: DC
  Filled 2018-03-19: qty 0.8

## 2018-03-19 MED ORDER — ACETAMINOPHEN 10 MG/ML IV SOLN
INTRAVENOUS | Status: DC | PRN
  Administered 2018-03-19: 1000 mg via INTRAVENOUS

## 2018-03-19 MED ORDER — BUPIVACAINE LIPOSOME 1.3 % IJ SUSP
INTRAMUSCULAR | Status: AC
  Filled 2018-03-19: qty 20

## 2018-03-19 MED ORDER — LIDOCAINE HCL URETHRAL/MUCOSAL 2 % EX GEL
CUTANEOUS | Status: AC
  Filled 2018-03-19: qty 5

## 2018-03-19 MED ORDER — ENOXAPARIN SODIUM 40 MG/0.4ML ~~LOC~~ SOLN
40.0000 mg | SUBCUTANEOUS | Status: DC
  Administered 2018-03-20 – 2018-03-24 (×5): 40 mg via SUBCUTANEOUS
  Filled 2018-03-19 (×6): qty 0.4

## 2018-03-19 MED ORDER — LISINOPRIL-HYDROCHLOROTHIAZIDE 20-12.5 MG PO TABS: 2.0000 | ORAL_TABLET | Freq: Every day | ORAL | Status: DC

## 2018-03-19 MED ORDER — INSULIN ASPART 100 UNIT/ML ~~LOC~~ SOLN: 7.0000 [IU] | Freq: Three times a day (TID) | SUBCUTANEOUS | Status: DC

## 2018-03-19 MED ORDER — PHENYLEPHRINE HCL 10 MG/ML IJ SOLN
INTRAMUSCULAR | Status: AC
  Filled 2018-03-19: qty 1

## 2018-03-19 MED ORDER — METHOCARBAMOL 500 MG PO TABS
500.0000 mg | ORAL_TABLET | Freq: Four times a day (QID) | ORAL | Status: DC | PRN
  Administered 2018-03-19 – 2018-03-21 (×2): 500 mg via ORAL
  Filled 2018-03-19 (×2): qty 1

## 2018-03-19 MED ORDER — BUPIVACAINE-EPINEPHRINE 0.25% -1:200000 IJ SOLN
INTRAMUSCULAR | Status: DC | PRN
  Administered 2018-03-19: 60 mL

## 2018-03-19 MED ORDER — SENNOSIDES-DOCUSATE SODIUM 8.6-50 MG PO TABS
1.0000 | ORAL_TABLET | Freq: Every evening | ORAL | Status: DC | PRN
  Administered 2018-03-20 – 2018-03-21 (×2): 1 via ORAL
  Filled 2018-03-19 (×2): qty 1

## 2018-03-19 MED ORDER — ONDANSETRON HCL 4 MG/2ML IJ SOLN
INTRAMUSCULAR | Status: AC
  Filled 2018-03-19: qty 2

## 2018-03-19 MED ORDER — ACETAMINOPHEN 500 MG PO TABS
1000.0000 mg | ORAL_TABLET | Freq: Four times a day (QID) | ORAL | Status: AC
  Administered 2018-03-19 – 2018-03-20 (×4): 1000 mg via ORAL
  Filled 2018-03-19 (×4): qty 2

## 2018-03-19 MED ORDER — TAMSULOSIN HCL 0.4 MG PO CAPS
0.4000 mg | ORAL_CAPSULE | Freq: Every day | ORAL | Status: DC
  Administered 2018-03-20 – 2018-03-24 (×5): 0.4 mg via ORAL
  Filled 2018-03-19 (×5): qty 1

## 2018-03-19 MED ORDER — INSULIN ASPART 100 UNIT/ML ~~LOC~~ SOLN
0.0000 [IU] | Freq: Three times a day (TID) | SUBCUTANEOUS | Status: DC
  Administered 2018-03-19: 5 [IU] via SUBCUTANEOUS
  Administered 2018-03-20: 3 [IU] via SUBCUTANEOUS
  Administered 2018-03-20: 5 [IU] via SUBCUTANEOUS
  Administered 2018-03-21: 2 [IU] via SUBCUTANEOUS
  Administered 2018-03-21: 5 [IU] via SUBCUTANEOUS
  Filled 2018-03-19 (×5): qty 1

## 2018-03-19 MED ORDER — LIDOCAINE HCL (PF) 2 % IJ SOLN
INTRAMUSCULAR | Status: AC
  Filled 2018-03-19: qty 10

## 2018-03-19 MED ORDER — PROPOFOL 500 MG/50ML IV EMUL
INTRAVENOUS | Status: DC | PRN
  Administered 2018-03-19: 60 ug/kg/min via INTRAVENOUS

## 2018-03-19 MED ORDER — FAMOTIDINE 20 MG PO TABS
20.0000 mg | ORAL_TABLET | Freq: Once | ORAL | Status: AC
  Administered 2018-03-19: 20 mg via ORAL

## 2018-03-19 MED ORDER — ACETAMINOPHEN 10 MG/ML IV SOLN
INTRAVENOUS | Status: AC
  Filled 2018-03-19: qty 100

## 2018-03-19 MED ORDER — SODIUM CHLORIDE 0.9 % IV SOLN
INTRAVENOUS | Status: DC
  Administered 2018-03-19 – 2018-03-20 (×3): via INTRAVENOUS

## 2018-03-19 MED ORDER — LISINOPRIL 20 MG PO TABS
40.0000 mg | ORAL_TABLET | Freq: Every day | ORAL | Status: DC
  Filled 2018-03-19: qty 2

## 2018-03-19 MED ORDER — PROPOFOL 10 MG/ML IV BOLUS
INTRAVENOUS | Status: AC
  Filled 2018-03-19: qty 20

## 2018-03-19 MED ORDER — SODIUM CHLORIDE 0.9 % IV SOLN
INTRAVENOUS | Status: DC
  Administered 2018-03-19 (×2): via INTRAVENOUS

## 2018-03-19 MED ORDER — DIPHENHYDRAMINE HCL 12.5 MG/5ML PO ELIX: 12.5000 mg | ORAL_SOLUTION | ORAL | Status: DC | PRN

## 2018-03-19 MED ORDER — OXYCODONE HCL 5 MG PO TABS: 5.0000 mg | ORAL_TABLET | Freq: Once | ORAL | Status: DC | PRN

## 2018-03-19 MED ORDER — OMEGA-3-ACID ETHYL ESTERS 1 G PO CAPS
1000.0000 mg | ORAL_CAPSULE | Freq: Every day | ORAL | Status: DC
  Administered 2018-03-19 – 2018-03-24 (×6): 1000 mg via ORAL
  Filled 2018-03-19 (×6): qty 1

## 2018-03-19 MED ORDER — MORPHINE SULFATE 4 MG/ML IJ SOLN
INTRAMUSCULAR | Status: DC | PRN
  Administered 2018-03-19: 4 mg

## 2018-03-19 MED ORDER — CLINDAMYCIN PHOSPHATE 900 MG/50ML IV SOLN
INTRAVENOUS | Status: AC
  Filled 2018-03-19: qty 50

## 2018-03-19 MED ORDER — ACETAMINOPHEN 325 MG PO TABS: 325.0000 mg | ORAL_TABLET | Freq: Four times a day (QID) | ORAL | Status: DC | PRN

## 2018-03-19 MED ORDER — NEOMYCIN-POLYMYXIN B GU 40-200000 IR SOLN
Status: AC
  Filled 2018-03-19: qty 20

## 2018-03-19 MED ORDER — CEFAZOLIN SODIUM-DEXTROSE 2-4 GM/100ML-% IV SOLN
INTRAVENOUS | Status: AC
  Filled 2018-03-19: qty 100

## 2018-03-19 MED ORDER — HYDROCHLOROTHIAZIDE 25 MG PO TABS
25.0000 mg | ORAL_TABLET | Freq: Every day | ORAL | Status: DC
  Filled 2018-03-19: qty 1

## 2018-03-19 MED ORDER — DOCUSATE SODIUM 100 MG PO CAPS
100.0000 mg | ORAL_CAPSULE | Freq: Two times a day (BID) | ORAL | Status: DC
  Administered 2018-03-19 – 2018-03-24 (×11): 100 mg via ORAL
  Filled 2018-03-19 (×11): qty 1

## 2018-03-19 MED ORDER — METOCLOPRAMIDE HCL 10 MG PO TABS: 5.0000 mg | ORAL_TABLET | Freq: Three times a day (TID) | ORAL | Status: DC | PRN

## 2018-03-19 MED ORDER — OXYCODONE HCL 5 MG PO TABS
5.0000 mg | ORAL_TABLET | ORAL | Status: DC | PRN
  Administered 2018-03-19: 5 mg via ORAL
  Filled 2018-03-19: qty 1

## 2018-03-19 MED ORDER — OXYCODONE HCL 5 MG/5ML PO SOLN: 5.0000 mg | Freq: Once | ORAL | Status: DC | PRN

## 2018-03-19 MED ORDER — SODIUM CHLORIDE FLUSH 0.9 % IV SOLN
INTRAVENOUS | Status: AC
  Filled 2018-03-19: qty 40

## 2018-03-19 MED ORDER — ONDANSETRON HCL 4 MG/2ML IJ SOLN
INTRAMUSCULAR | Status: DC | PRN
  Administered 2018-03-19: 4 mg via INTRAVENOUS

## 2018-03-19 MED ORDER — FENTANYL CITRATE (PF) 100 MCG/2ML IJ SOLN
INTRAMUSCULAR | Status: DC | PRN
  Administered 2018-03-19 (×2): 50 ug via INTRAVENOUS

## 2018-03-19 MED ORDER — PHENYLEPHRINE HCL 10 MG/ML IJ SOLN
INTRAMUSCULAR | Status: DC | PRN
  Administered 2018-03-19: 100 ug via INTRAVENOUS

## 2018-03-19 MED ORDER — INSULIN ASPART 100 UNIT/ML ~~LOC~~ SOLN
0.0000 [IU] | Freq: Every day | SUBCUTANEOUS | Status: DC
  Administered 2018-03-20: 3 [IU] via SUBCUTANEOUS
  Filled 2018-03-19: qty 1

## 2018-03-19 MED ORDER — METHOCARBAMOL 1000 MG/10ML IJ SOLN
500.0000 mg | Freq: Four times a day (QID) | INTRAVENOUS | Status: DC | PRN
  Filled 2018-03-19: qty 5

## 2018-03-19 MED ORDER — CEFAZOLIN SODIUM-DEXTROSE 2-4 GM/100ML-% IV SOLN
2.0000 g | Freq: Four times a day (QID) | INTRAVENOUS | Status: AC
  Administered 2018-03-19 (×2): 2 g via INTRAVENOUS
  Filled 2018-03-19 (×2): qty 100

## 2018-03-19 MED ORDER — SODIUM CHLORIDE 0.9 % IV SOLN
INTRAVENOUS | Status: DC | PRN
  Administered 2018-03-19: 60 mL

## 2018-03-19 MED ORDER — ASPIRIN EC 81 MG PO TBEC
81.0000 mg | DELAYED_RELEASE_TABLET | Freq: Every day | ORAL | Status: DC
  Administered 2018-03-19 – 2018-03-24 (×6): 81 mg via ORAL
  Filled 2018-03-19 (×6): qty 1

## 2018-03-19 MED ORDER — METOPROLOL TARTRATE 50 MG PO TABS
50.0000 mg | ORAL_TABLET | Freq: Two times a day (BID) | ORAL | Status: DC
  Administered 2018-03-19: 50 mg via ORAL
  Filled 2018-03-19: qty 1

## 2018-03-19 SURGICAL SUPPLY — 68 items
BLADE SAW 90X13X1.19 OSCILLAT (BLADE) ×2 IMPLANT
BLADE SAW 90X25X1.19 OSCILLAT (BLADE) ×2 IMPLANT
CANISTER SUCT 1200ML W/VALVE (MISCELLANEOUS) ×2 IMPLANT
CANISTER SUCT 3000ML PPV (MISCELLANEOUS) ×4 IMPLANT
CEMENT HV SMART SET (Cement) ×6 IMPLANT
CEMENT TIBIA MBT SIZE 6 (Knees) ×1 IMPLANT
CNTNR SPEC 2.5X3XGRAD LEK (MISCELLANEOUS)
CONT SPEC 4OZ STER OR WHT (MISCELLANEOUS)
CONTAINER SPEC 2.5X3XGRAD LEK (MISCELLANEOUS) IMPLANT
COOLER POLAR GLACIER W/PUMP (MISCELLANEOUS) ×2 IMPLANT
COVER WAND RF STERILE (DRAPES) IMPLANT
CUFF TOURN 24 STER (MISCELLANEOUS) IMPLANT
CUFF TOURN 30 STER DUAL PORT (MISCELLANEOUS) ×2 IMPLANT
DRAPE IMP U-DRAPE 54X76 (DRAPES) ×2 IMPLANT
DRAPE INCISE IOBAN 66X60 STRL (DRAPES) ×2 IMPLANT
DRAPE SHEET LG 3/4 BI-LAMINATE (DRAPES) ×4 IMPLANT
DRAPE SURG 17X11 SM STRL (DRAPES) ×4 IMPLANT
DRSG OPSITE POSTOP 4X12 (GAUZE/BANDAGES/DRESSINGS) ×2 IMPLANT
DRSG OPSITE POSTOP 4X14 (GAUZE/BANDAGES/DRESSINGS) IMPLANT
DURAPREP 26ML APPLICATOR (WOUND CARE) ×8 IMPLANT
ELECT REM PT RETURN 9FT ADLT (ELECTROSURGICAL) ×2
ELECTRODE REM PT RTRN 9FT ADLT (ELECTROSURGICAL) ×1 IMPLANT
FEMUR SIGMA PS SZ 6.0 L (Femur) ×2 IMPLANT
GAUZE SPONGE 4X4 12PLY STRL (GAUZE/BANDAGES/DRESSINGS) ×2 IMPLANT
GLOVE BIOGEL PI IND STRL 7.5 (GLOVE) ×6 IMPLANT
GLOVE BIOGEL PI IND STRL 9 (GLOVE) ×1 IMPLANT
GLOVE BIOGEL PI INDICATOR 7.5 (GLOVE) ×6
GLOVE BIOGEL PI INDICATOR 9 (GLOVE) ×1
GLOVE SURG 9.0 ORTHO LTXF (GLOVE) ×4 IMPLANT
GOWN STRL REUS TWL 2XL XL LVL4 (GOWN DISPOSABLE) ×2 IMPLANT
GOWN STRL REUS W/ TWL LRG LVL3 (GOWN DISPOSABLE) ×2 IMPLANT
GOWN STRL REUS W/ TWL LRG LVL4 (GOWN DISPOSABLE) ×1 IMPLANT
GOWN STRL REUS W/TWL LRG LVL3 (GOWN DISPOSABLE) ×2
GOWN STRL REUS W/TWL LRG LVL4 (GOWN DISPOSABLE) ×1
HOLDER FOLEY CATH W/STRAP (MISCELLANEOUS) ×2 IMPLANT
IMMBOLIZER KNEE 19 BLUE UNIV (SOFTGOODS) ×2 IMPLANT
INSERT PFC SIG STB SZ6 12.5MM (Knees) ×2 IMPLANT
KIT TURNOVER KIT A (KITS) ×2 IMPLANT
MAT ABSORB  FLUID 56X50 GRAY (MISCELLANEOUS) ×1
MAT ABSORB FLUID 56X50 GRAY (MISCELLANEOUS) ×1 IMPLANT
NDL SAFETY ECLIPSE 18X1.5 (NEEDLE) ×1 IMPLANT
NEEDLE HYPO 18GX1.5 SHARP (NEEDLE) ×1
NEEDLE HYPO 22GX1.5 SAFETY (NEEDLE) ×2 IMPLANT
NEEDLE SPNL 20GX3.5 QUINCKE YW (NEEDLE) ×2 IMPLANT
NS IRRIG 1000ML POUR BTL (IV SOLUTION) ×2 IMPLANT
PACK TOTAL KNEE (MISCELLANEOUS) ×2 IMPLANT
PAD PREP 24X41 OB/GYN DISP (PERSONAL CARE ITEMS) ×2 IMPLANT
PAD WRAPON POLAR KNEE (MISCELLANEOUS) ×1 IMPLANT
PATELLA DOME PFC 38MM (Knees) ×2 IMPLANT
PULSAVAC PLUS IRRIG FAN TIP (DISPOSABLE) ×2
SOL .9 NS 3000ML IRR  AL (IV SOLUTION) ×1
SOL .9 NS 3000ML IRR UROMATIC (IV SOLUTION) ×1 IMPLANT
SPONGE DRAIN TRACH 4X4 STRL 2S (GAUZE/BANDAGES/DRESSINGS) ×2 IMPLANT
SPONGE LAP 18X18 RF (DISPOSABLE) IMPLANT
STAPLER SKIN PROX 35W (STAPLE) ×2 IMPLANT
SUCTION FRAZIER HANDLE 10FR (MISCELLANEOUS) ×1
SUCTION TUBE FRAZIER 10FR DISP (MISCELLANEOUS) ×1 IMPLANT
SUT ETHIBOND NAB CT1 #1 30IN (SUTURE) ×4 IMPLANT
SUT VIC AB 0 CT1 36 (SUTURE) ×2 IMPLANT
SUT VIC AB 2-0 CT1 (SUTURE) ×4 IMPLANT
SYR 20CC LL (SYRINGE) ×2 IMPLANT
SYR 30ML LL (SYRINGE) ×4 IMPLANT
TIBIA MBT CEMENT SIZE 6 (Knees) ×2 IMPLANT
TIP FAN IRRIG PULSAVAC PLUS (DISPOSABLE) ×1 IMPLANT
TOWER CARTRIDGE SMART MIX (DISPOSABLE) ×4 IMPLANT
TRAY FOLEY MTR SLVR 16FR STAT (SET/KITS/TRAYS/PACK) ×2 IMPLANT
TUBE SUCT KAM VAC (TUBING) ×2 IMPLANT
WRAPON POLAR PAD KNEE (MISCELLANEOUS) ×2

## 2018-03-19 NOTE — Anesthesia Procedure Notes (Signed)
Spinal  Patient location during procedure: OR Start time: 03/19/2018 7:47 AM End time: 03/19/2018 7:50 AM Staffing Anesthesiologist: Tykesha Konicki, Precious Haws, MD Performed: anesthesiologist  Preanesthetic Checklist Completed: patient identified, site marked, surgical consent, pre-op evaluation, timeout performed, IV checked, risks and benefits discussed and monitors and equipment checked Spinal Block Patient position: sitting Prep: ChloraPrep Patient monitoring: heart rate, continuous pulse ox, blood pressure and cardiac monitor Approach: midline Location: L3-4 Injection technique: single-shot Needle Needle type: Whitacre and Introducer  Needle gauge: 24 G Needle length: 9 cm Assessment Sensory level: T10 Additional Notes Negative paresthesia. Negative blood return. Positive free-flowing CSF. Expiration date of kit checked and confirmed. Patient tolerated procedure well, without complications.

## 2018-03-19 NOTE — Anesthesia Preprocedure Evaluation (Signed)
Anesthesia Evaluation  Patient identified by MRN, date of birth, ID band Patient awake    Reviewed: Allergy & Precautions, H&P , NPO status , Patient's Chart, lab work & pertinent test results  History of Anesthesia Complications Negative for: history of anesthetic complications  Airway Mallampati: III  TM Distance: >3 FB Neck ROM: full    Dental  (+) Chipped, Poor Dentition, Missing   Pulmonary neg pulmonary ROS, neg shortness of breath,           Cardiovascular Exercise Tolerance: Good hypertension, (-) angina(-) Past MI and (-) DOE      Neuro/Psych negative neurological ROS  negative psych ROS   GI/Hepatic negative GI ROS, Neg liver ROS,   Endo/Other  diabetes, Type 2, Insulin Dependent  Renal/GU      Musculoskeletal   Abdominal   Peds  Hematology negative hematology ROS (+)   Anesthesia Other Findings Hoarse voice this morning, no SOB, no fever, otherwise health  Past Medical History: No date: BPH (benign prostatic hyperplasia) No date: Diabetes mellitus without complication (HCC) No date: HOH (hard of hearing)     Comment:  AIDS No date: Hypertension No date: Pain     Comment:  CHRONIC LBP  Past Surgical History: No date: BACK SURGERY 07/10/2017: CATARACT EXTRACTION W/PHACO; Right     Comment:  Procedure: CATARACT EXTRACTION PHACO AND INTRAOCULAR               LENS PLACEMENT (IOC);  Surgeon: Birder Robson, MD;                Location: [facility] ORS;  Service: Ophthalmology;  Laterality:              Right;  [place] 00:29.2 AP% 16.5 CDE 4.83 Fluid Pack Lot #               [id number] No date: COLONOSCOPY No date: EYE SURGERY No date: HERNIA REPAIR     Reproductive/Obstetrics negative OB ROS                             Anesthesia Physical Anesthesia Plan  ASA: III  Anesthesia Plan: Spinal   Post-op Pain Management:    Induction:   PONV Risk Score and Plan:   Airway  Management Planned: Natural Airway and Nasal Cannula  Additional Equipment:   Intra-op Plan:   Post-operative Plan:   Informed Consent: I have reviewed the patients History and Physical, chart, labs and discussed the procedure including the risks, benefits and alternatives for the proposed anesthesia with the patient or authorized representative who has indicated his/her understanding and acceptance.   Dental Advisory Given  Plan Discussed with: Anesthesiologist, CRNA and Surgeon  Anesthesia Plan Comments: (Patient reports no bleeding problems and no anticoagulant use.  Plan for spinal with backup GA  Patient consented for risks of anesthesia including but not limited to:  - adverse reactions to medications - risk of bleeding, infection, nerve damage and headache - risk of failed spinal - damage to teeth, lips or other oral mucosa - sore throat or hoarseness - Damage to heart, brain, lungs or loss of life  Patient voiced understanding.)        Anesthesia Quick Evaluation

## 2018-03-19 NOTE — Transfer of Care (Signed)
Immediate Anesthesia Transfer of Care Note  Patient: Keith Howe  Procedure(s) Performed: TOTAL KNEE ARTHROPLASTY (Left )  Patient Location: PACU  Anesthesia Type:Spinal  Level of Consciousness: drowsy  Airway & Oxygen Therapy: Patient Spontanous Breathing and Patient connected to face mask oxygen  Post-op Assessment: Report given to RN and Post -op Vital signs reviewed and stable  Post vital signs: Reviewed and stable  Last Vitals:  Vitals Value Taken Time  BP 88/59 03/19/2018 10:53 AM  Temp    Pulse 81 03/19/2018 10:54 AM  Resp 16 03/19/2018 10:54 AM  SpO2 100 % 03/19/2018 10:54 AM  Vitals shown include unvalidated device data.  Last Pain:  Vitals:   03/19/18 0708  TempSrc: Tympanic  PainSc: 5          Complications: No apparent anesthesia complications

## 2018-03-19 NOTE — Progress Notes (Signed)
Applied ted hose to right leg   Noted type 2 block 2:1   Called [place] to look at patient

## 2018-03-19 NOTE — Anesthesia Post-op Follow-up Note (Signed)
Anesthesia QCDR form completed.        

## 2018-03-19 NOTE — Evaluation (Signed)
Physical Therapy Evaluation Patient Details Name: Keith Howe MRN: [id number] DOB: 14-Dec-1940 Today's Date: 03/19/2018   History of Present Illness  admitted for acute hospitalization status post L TKR (03/19/18), WBAT.  Clinical Impression  Upon evaluation, patient alert and oriented; follows commands and demonstrates good effort with therapy tasks.  Pain rated 6-7/10 throughout evaluation; minimal change with movement and WBing.  Demonstrates fair/good post-op strength (3-/5) and ROM (6-74 degrees) to L knee, generally limited by pain and post-op bandaging.  Able to complete bed mobility with sup; sit/stand with RW, min/mod assist.  Transition to upright generally guarded and effortful, limited active use of L LE noted.  Patient reporting onset of dizziness/lightheadedness with transition to standing; additional gait or OOB efforts deferred due to symptomatic hypotension (see vitals flowsheet for details).  Will continue mobility assessment/progression as medically appropriate. Would benefit from skilled PT to address above deficits and promote optimal return to PLOF; Recommend transition to [place] upon discharge from acute hospitalization.     Follow Up Recommendations Home health PT    Equipment Recommendations  Rolling walker with 5" wheels;3in1 (PT)    Recommendations for Other Services       Precautions / Restrictions Precautions Precautions: Fall Required Braces or Orthoses: (L LE KI When in bed) Restrictions Weight Bearing Restrictions: Yes LLE Weight Bearing: Weight bearing as tolerated      Mobility  Bed Mobility Overal bed mobility: Needs Assistance Bed Mobility: Supine to Sit     Supine to sit: Supervision        Transfers   Equipment used: Rolling walker (2 wheeled) Transfers: Sit to/from Stand Sit to Stand: Min assist;Mod assist         General transfer comment: cuing for hand placement, very slow effortful movement transition; limited WBing L LE  with sit/stand  Ambulation/Gait             General Gait Details: deferred secondary to syptomatic orthostasis  Stairs            Wheelchair Mobility    Modified Rankin (Stroke Patients Only)       Balance Overall balance assessment: Needs assistance Sitting-balance support: No upper extremity supported;Feet supported Sitting balance-Leahy Scale: Good     Standing balance support: Bilateral upper extremity supported Standing balance-Leahy Scale: Fair                               Pertinent Vitals/Pain Pain Assessment: 0-10 Pain Score: 6  Pain Location: L knee Pain Descriptors / Indicators: Aching;Grimacing;Guarding Pain Intervention(s): Limited activity within patient's tolerance;Monitored during session;Premedicated before session;Repositioned    Home Living Family/patient expects to be discharged to:: Private residence Living Arrangements: Spouse/significant other Available Help at Discharge: Family;Available 24 hours/day Type of Home: House Home Access: Stairs to enter   [employer] of Steps: 2 Home Layout: Multi-level;Able to live on main level with bedroom/bathroom Home Equipment: Cane - single point Additional Comments: Can access home through back/side entrance without stairs to enter/exit and can live on lower level of home (no stairs required to access bed/bathroom) if needed at discharge    Prior Function Level of Independence: Independent         Comments: Indep without assist device for ADLs, household and community mobilization; intermittent use of SPC for longer, community distances due to worsening L knee pain     Hand Dominance        Extremity/Trunk Assessment   Upper  Extremity Assessment Upper Extremity Assessment: Overall WFL for tasks assessed    Lower Extremity Assessment Lower Extremity Assessment: (L knee grossly 3-/5 throughout, limited by pain; sensation fully intact.  Otherwise LES at least  4/5)       Communication   Communication: No difficulties  Cognition Arousal/Alertness: Awake/alert Behavior During Therapy: WFL for tasks assessed/performed Overall Cognitive Status: Within Functional Limits for tasks assessed                                        General Comments      Exercises Total Joint Exercises Goniometric ROM: L knee: 7 - 74 degrees, limited by pain Other Exercises Other Exercises: Supine L LE therex, 1x10, act/act assist ROM: ankle pumps, quad sets, SAQs, heel slides, hip abduct/adduct and SLR.  Fair/good L quad activation, increased time required   Assessment/Plan    PT Assessment Patient needs continued PT services  PT Problem List Decreased strength;Decreased activity tolerance;Decreased mobility;Decreased range of motion;Decreased balance;Decreased coordination;Decreased knowledge of precautions;Decreased knowledge of use of DME;Pain;Decreased skin integrity       PT Treatment Interventions DME instruction;Stair training;Therapeutic activities;Balance training;Gait training;Therapeutic exercise;Functional mobility training;Patient/family education    PT Goals (Current goals can be found in the Care Plan section)  Acute Rehab PT Goals Patient Stated Goal: to return home PT Goal Formulation: With patient/family Time For Goal Achievement: 04/02/18 Potential to Achieve Goals: Good    Frequency BID   Barriers to discharge        Co-evaluation               AM-PAC PT "6 Clicks" Daily Activity  Outcome Measure Difficulty turning over in bed (including adjusting bedclothes, sheets and blankets)?: A Little Difficulty moving from lying on back to sitting on the side of the bed? : A Little Difficulty sitting down on and standing up from a chair with arms (e.g., wheelchair, bedside commode, etc,.)?: Unable Help needed moving to and from a bed to chair (including a wheelchair)?: A Lot Help needed walking in hospital room?:  Total Help needed climbing 3-5 steps with a railing? : Total 6 Click Score: 11    End of Session Equipment Utilized During Treatment: Gait belt Activity Tolerance: Patient tolerated treatment well;Treatment limited secondary to medical complications (Comment)(symptomatic hypotension) Patient left: in bed;with bed alarm set;with family/visitor present   PT Visit Diagnosis: Muscle weakness (generalized) (M62.81);Difficulty in walking, not elsewhere classified (R26.2);Pain Pain - Right/Left: Left Pain - part of body: Knee    Time: 1450-1530 PT Time Calculation (min) (ACUTE ONLY): 40 min   Charges:   PT Evaluation $PT Eval Moderate Complexity: 1 Mod PT Treatments $Therapeutic Exercise: 8-22 mins $Therapeutic Activity: 8-22 mins        Gabbrielle Mcnicholas H. Owens Shark, PT, DPT, NCS 03/19/18, 11:24 PM [phone]

## 2018-03-19 NOTE — Consult Note (Signed)
[facility] [place] NAME: Keith Howe    MR#:  [id number]  DATE OF BIRTH:  Aug 19, 1940  DATE OF ADMISSION:  03/19/2018  PRIMARY CARE PHYSICIAN: Cletis Athens, MD   REQUESTING/REFERRING PHYSICIAN: Dr. Thornton Park  CHIEF COMPLAINT:  No chief complaint on file.   HISTORY OF PRESENT ILLNESS:  Keith Howe  is a 77 y.o. male with a known history of hypertension, insulin-dependent diabetes mellitus, chronic low back pain and arthritis presents to hospital secondary to worsening left knee pain secondary to osteoarthritis.  Patient had left total knee surgery done today and medical consult requested for hypertension and medical management. Patient had osteoarthritis and pain in his left knee for almost a year now.  Try to avoid surgery as he could still get around.  Pain was getting worse and so he opted for surgery.  Had his total knee replacement surgery done this afternoon.  Blood pressure in the PACU was noted to be in the 82N systolic.  Medical consult requested for the same.  Patient denies any lightheadedness or dizziness.  No chest pain, no nausea or vomiting.  Blood pressure has been improving to IV fluid boluses.  Patient takes lisinopril, hydrochlorothiazide and metoprolol at home for hypertension.  Also sugars have been low normal today as he was n.p.o.  PAST MEDICAL HISTORY:   Past Medical History:  Diagnosis Date  . BPH (benign prostatic hyperplasia)   . Diabetes mellitus without complication ([place])   . HOH (hard of hearing)    AIDS  . Hypertension   . Pain    CHRONIC LBP    PAST SURGICAL HISTOIRY:   Past Surgical History:  Procedure Laterality Date  . BACK SURGERY    . CATARACT EXTRACTION W/PHACO Right 07/10/2017   Procedure: CATARACT EXTRACTION PHACO AND INTRAOCULAR LENS PLACEMENT (IOC);  Surgeon: Birder Robson, MD;  Location: [facility] ORS;  Service: Ophthalmology;  Laterality: Right;  [place] 00:29.2 AP% 16.5 CDE  4.83 Fluid Pack Lot # [id number]  . COLONOSCOPY    . EYE SURGERY    . HERNIA REPAIR      SOCIAL HISTORY:   Social History   Tobacco Use  . Smoking status: Never Smoker  . Smokeless tobacco: Never Used  Substance Use Topics  . Alcohol use: No    Frequency: Never    FAMILY HISTORY:   Family History  Problem Relation Age of Onset  . Diabetes Father     DRUG ALLERGIES:  No Known Allergies  REVIEW OF SYSTEMS:   Review of Systems  Constitutional: Negative for chills, fever, malaise/fatigue and weight loss.  HENT: Negative for ear discharge, ear pain, hearing loss, nosebleeds and tinnitus.   Eyes: Negative for blurred vision, double vision and photophobia.  Respiratory: Negative for cough, hemoptysis, shortness of breath and wheezing.   Cardiovascular: Negative for chest pain, palpitations, orthopnea and leg swelling.  Gastrointestinal: Negative for abdominal pain, constipation, diarrhea, heartburn, melena, nausea and vomiting.  Genitourinary: Negative for dysuria, frequency, hematuria and urgency.  Musculoskeletal: Positive for joint pain and myalgias. Negative for back pain and neck pain.  Skin: Negative for rash.  Neurological: Negative for dizziness, tingling, tremors, sensory change, speech change, focal weakness and headaches.  Endo/Heme/Allergies: Does not bruise/bleed easily.  Psychiatric/Behavioral: Negative for depression.     MEDICATIONS AT HOME:   Prior to Admission medications   Medication Sig Start Date End Date Taking? Authorizing Provider  aspirin EC 81 MG tablet Take 81 mg by  mouth daily.   Yes [provider]  cholecalciferol (VITAMIN D) 1000 units tablet Take 1,000 Units by mouth daily.   Yes [provider]  dutasteride (AVODART) 0.5 MG capsule Take 0.5 mg by mouth daily. 06/21/17  Yes [provider]  lisinopril-hydrochlorothiazide (PRINZIDE,ZESTORETIC) 20-12.5 MG tablet Take 2 tablets by mouth daily.   Yes [provider]  lovastatin (MEVACOR) 40 MG tablet Take 40 mg by mouth every morning.   Yes [provider]  metoprolol tartrate (LOPRESSOR) 50 MG tablet Take 50 mg by mouth 2 (two) times daily.   Yes [provider]  Misc Natural Products (PROSTATE SUPPORT PO) Take 2 capsules by mouth daily.   Yes [provider]  NOVOLOG FLEXPEN 100 UNIT/ML FlexPen Inject 7 Units into the skin 3 (three) times daily before meals. 01/03/18  Yes [provider]  Omega-3 Fatty Acids (FISH OIL) 1000 MG CAPS Take 1,000 mg by mouth daily.    Yes [provider]  tamsulosin (FLOMAX) 0.4 MG CAPS capsule Take 0.4 mg by mouth daily after breakfast.    Yes [provider]  TRESIBA FLEXTOUCH 200 UNIT/ML SOPN Inject 80 Units into the muscle every morning. 06/20/17  Yes [provider]  TRULICITY 1.5 BS/9.6GE SOPN Inject 1.5 mg into the muscle every Wednesday.  04/17/17  Yes [provider]      VITAL SIGNS:  Blood pressure 97/79, pulse 65, temperature (!) 97.4 F (36.3 C), temperature source Oral, resp. rate 18, SpO2 100 %.  PHYSICAL EXAMINATION:   Physical Exam  GENERAL:  77 y.o.-year-old obese patient lying in the bed with no acute distress.  EYES: Pupils equal, round, reactive to light and accommodation. No scleral icterus. Extraocular muscles intact.  HEENT: Head atraumatic, normocephalic. Oropharynx and nasopharynx clear.  NECK:  Supple, no jugular venous distention. No thyroid enlargement, no tenderness.  LUNGS: Normal breath sounds bilaterally, no wheezing, rales,rhonchi or crepitation. No use of accessory muscles of respiration.  Decreased bibasilar breath sounds CARDIOVASCULAR: S1, S2 normal. No murmurs, rubs, or gallops.  ABDOMEN: Soft, nontender, nondistended. Bowel sounds present. No organomegaly or mass.  EXTREMITIES: No pedal edema, cyanosis, or clubbing.  Left leg immobilized. NEUROLOGIC: Cranial nerves II through XII are intact.  Muscle strength 5/5 in all extremities except the left leg due to immobilizer and pain. Sensation intact. Gait not checked.  PSYCHIATRIC: The patient is alert and oriented x 3.  SKIN: No obvious rash, lesion, or ulcer.   LABORATORY PANEL:   CBC Recent Labs  Lab 03/19/18 1327  WBC 5.2  HGB 12.3*  HCT 38.6*  PLT 163   ------------------------------------------------------------------------------------------------------------------  Chemistries  Recent Labs  Lab 03/19/18 1327  CREATININE 1.65*   ------------------------------------------------------------------------------------------------------------------  Cardiac Enzymes No results for input(s): TROPONINI in the last 168 hours. ------------------------------------------------------------------------------------------------------------------  RADIOLOGY:  Dg Knee Left Port  Result Date: 03/19/2018 CLINICAL DATA:  Status post left knee replacement EXAM: PORTABLE LEFT KNEE - 1-2 VIEW COMPARISON:  None. FINDINGS: Left knee prosthesis is seen. No loosening or acute bony abnormality is noted. No soft tissue changes are seen. IMPRESSION: Status post left knee replacement Electronically Signed   By: Inez Catalina M.D.   On: 03/19/2018 11:33    EKG:   Orders placed or performed during the hospital encounter of 03/06/18  . EKG test  . EKG test    IMPRESSION AND PLAN:   Keith Howe  is a 77 y.o. male with a known history of hypertension, insulin-dependent diabetes mellitus, chronic low  back pain and arthritis presents to hospital secondary to worsening left knee pain secondary to osteoarthritis.  Patient had left total knee surgery done today and medical consult requested for hypertension and medical management.  1.  Hypotension-could have been secondary to pain medications. -Responded to IV fluid bolus.  Continue to monitor.  Hold his lisinopril hydrochlorothiazide. -Metoprolol with holding orders entered. -IV fluid bolus as  needed and monitor carefully. -Check urine analysis.  No fevers noted so far.  WBCs within normal limits.  Less likely from infection  2.  Diabetes mellitus-insulin-dependent with low sugars today as patient has been n.p.o.  Appreciate diabetes coordinator's input.  Change Lantus to 40 units, discontinued pre-meal insulin. -On sliding scale insulin.  Monitor closely  3.  CKD stage III-stable.  Continue to monitor  4.  Osteoarthritis with s/p left total knee replacement-management per orthopedics. -Postop day 0 today.  Lovenox for DVT prophylaxis from tomorrow -Pain control and physical therapy consult  5.  BPH-continue home medications  6.  DVT prophylaxis-Lovenox-scheduled to be started from tomorrow    All the records are reviewed and case discussed with Consulting provider. Management plans discussed with the patient, family and they are in agreement.  CODE STATUS: Full code  TOTAL TIME TAKING CARE OF THIS PATIENT: 50 minutes.    Gladstone Lighter M.D on 03/19/2018 at 4:40 PM  Between 7am to 6pm - Pager - [phone]  After 6pm go to [email or web address] - password [email or web address] [facility]  [facility]  Office  [phone]  CC: Primary care Physician: Cletis Athens, MD

## 2018-03-19 NOTE — Progress Notes (Signed)
Pt piscitello in to check pt  Looked at strip that was printed out   Is fine to send to floor   Pt asymptomic

## 2018-03-19 NOTE — Progress Notes (Signed)
Inpatient Diabetes Program Recommendations  AACE/ADA: New Consensus Statement on Inpatient Glycemic Control (2019)  Target Ranges:  Prepandial:   less than 140 mg/dL      Peak postprandial:   less than 180 mg/dL (1-2 hours)      Critically ill patients:  140 - 180 mg/dL   Results for Keith Howe, Keith Howe (MRN [id number]) as of 03/19/2018 15:06  Ref. Range 03/19/2018 10:57 03/19/2018 12:51 03/19/2018 13:12 03/19/2018 13:39 03/19/2018 14:14  Glucose-Capillary Latest Ref Range: 70 - 99 mg/dL 83 62 (L) 57 (L) 55 (L) 121 (H)   Review of Glycemic Control  Diabetes history: DM2 Outpatient Diabetes medications: Tresiba 80 units QAM, Trulicity 1.5 mg Qweek, Novolog 7 units TID with meals Current orders for Inpatient glycemic control: Lantus 80 units QAM, Novolog 7 units TID with meals  Inpatient Diabetes Program Recommendations:  Insulin - Basal: Noted hypoglycemia today and NO insulin given sinced arriving at the hospital today and per home medication list patient last took [name] on 03/18/18. Please consider decreasing Lantus to 40 units daily. Correction (SSI): Please consider using Glycemic Control order set and order CBGs with Novolog 0-9 units TID wtih meals and Novolog 0-5 units QHS. Insulin - Meal Coverage: Please discontinue Novolog 7 units TID wtih meals for now.  Thanks, Barnie Alderman, RN, MSN, CDE Diabetes Coordinator Inpatient Diabetes Program [phone] (Team Pager from 8am to 5pm)

## 2018-03-19 NOTE — NC FL2 (Signed)
[place] LEVEL OF CARE SCREENING TOOL     IDENTIFICATION  Patient Name: SHAHZAIB AZEVEDO Birthdate: Sep 08, 1940 Sex: male Admission Date (Current Location): 03/19/2018  [place] and [place] Number:  Engineering geologist and Address:  [facility], [address] [place], [place] [address]      Provider Number: [id number]  Attending Physician Name and Address:  Thornton Park, MD  Relative Name and Phone Number:       Current Level of Care: Hospital Recommended Level of Care: [place] Prior Approval Number:    Date Approved/Denied:   PASRR Number: [id number])  Discharge Plan: SNF    Current Diagnoses: Patient Active Problem List   Diagnosis Date Noted  . S/P TKR (total knee replacement) using cement, left 03/19/2018    Orientation RESPIRATION BLADDER Height & Weight     Self, Time, Situation, Place  Normal Continent Weight:   Height:     BEHAVIORAL SYMPTOMS/MOOD NEUROLOGICAL BOWEL NUTRITION STATUS      Continent Diet(Diet: Carb Modified. )  AMBULATORY STATUS COMMUNICATION OF NEEDS Skin   Extensive Assist Verbally Surgical wounds(Incision: Left Knee. )                       Personal Care Assistance Level of Assistance  Bathing, Feeding, Dressing Bathing Assistance: Limited assistance Feeding assistance: Independent Dressing Assistance: Limited assistance     Functional Limitations Info  Sight, Hearing, Speech Sight Info: Adequate Hearing Info: Impaired Speech Info: Adequate    SPECIAL CARE FACTORS FREQUENCY  PT (By licensed PT), OT (By licensed OT)     PT Frequency: (5) OT Frequency: (5)            Contractures      Additional Factors Info  Code Status, Allergies Code Status Info: (Full Code. ) Allergies Info: (No Known Allergies. )           Current Medications (03/19/2018):  This is the current hospital active medication list Current Facility-Administered Medications   Medication Dose Route Frequency Provider Last Rate Last Dose  . 0.9 %  sodium chloride infusion   Intravenous Continuous Thornton Park, MD 75 mL/hr at 03/19/18 1507    . acetaminophen (TYLENOL) tablet 1,000 mg  1,000 mg Oral Q6H Thornton Park, MD      . Derrill Memo ON 03/20/2018] acetaminophen (TYLENOL) tablet 325-650 mg  325-650 mg Oral Q6H PRN Thornton Park, MD      . alum & mag hydroxide-simeth (MAALOX/MYLANTA) 200-200-20 MG/5ML suspension 30 mL  30 mL Oral Q4H PRN Thornton Park, MD      . aspirin EC tablet 81 mg  81 mg Oral Daily Thornton Park, MD   81 mg at 03/19/18 1421  . bisacodyl (DULCOLAX) EC tablet 10 mg  10 mg Oral Daily PRN Thornton Park, MD      . ceFAZolin (ANCEF) IVPB 2g/100 mL premix  2 g Intravenous Q6H Thornton Park, MD   Stopped at 03/19/18 1450  . cholecalciferol (VITAMIN D3) tablet 1,000 Units  1,000 Units Oral Daily Thornton Park, MD   1,000 Units at 03/19/18 1421  . diphenhydrAMINE (BENADRYL) 12.5 MG/5ML elixir 12.5-25 mg  12.5-25 mg Oral Q4H PRN Thornton Park, MD      . docusate sodium (COLACE) capsule 100 mg  100 mg Oral BID Thornton Park, MD   100 mg at 03/19/18 1421  . [START ON 03/20/2018] Dulaglutide SOPN 1.5 mg  1.5 mg Intramuscular Q Kris Hartmann, MD      . [  START ON 03/20/2018] dutasteride (AVODART) capsule 0.5 mg  0.5 mg Oral Daily Thornton Park, MD      . Derrill Memo ON 03/20/2018] enoxaparin (LOVENOX) injection 40 mg  40 mg Subcutaneous Q24H Thornton Park, MD      . gabapentin (NEURONTIN) capsule 300 mg  300 mg Oral TID Thornton Park, MD      . lisinopril (PRINIVIL,ZESTRIL) tablet 40 mg  40 mg Oral Daily Thornton Park, MD       And  . hydrochlorothiazide (HYDRODIURIL) tablet 25 mg  25 mg Oral Daily Thornton Park, MD      . HYDROmorphone (DILAUDID) injection 0.5-1 mg  0.5-1 mg Intravenous Q4H PRN Thornton Park, MD      . insulin aspart (novoLOG) injection 0-5 Units  0-5 Units Subcutaneous QHS Thornton Park, MD       . insulin aspart (novoLOG) injection 0-9 Units  0-9 Units Subcutaneous TID WC Thornton Park, MD      . insulin glargine (LANTUS) injection 40 Units  40 Units Subcutaneous Daily Thornton Park, MD   Stopped at 03/19/18 1541  . magnesium citrate solution 1 Bottle  1 Bottle Oral Once PRN Thornton Park, MD      . methocarbamol (ROBAXIN) tablet 500 mg  500 mg Oral Q6H PRN Thornton Park, MD   500 mg at 03/19/18 1430   Or  . methocarbamol (ROBAXIN) 500 mg in dextrose 5 % 50 mL IVPB  500 mg Intravenous Q6H PRN Thornton Park, MD      . metoCLOPramide (REGLAN) tablet 5-10 mg  5-10 mg Oral Q8H PRN Thornton Park, MD       Or  . metoCLOPramide (REGLAN) injection 5-10 mg  5-10 mg Intravenous Q8H PRN Thornton Park, MD      . metoprolol tartrate (LOPRESSOR) tablet 50 mg  50 mg Oral BID Thornton Park, MD      . omega-3 acid ethyl esters (LOVAZA) capsule 1,000 mg  1,000 mg Oral Daily Thornton Park, MD   1,000 mg at 03/19/18 1421  . ondansetron (ZOFRAN) tablet 4 mg  4 mg Oral Q6H PRN Thornton Park, MD       Or  . ondansetron [facility]) injection 4 mg  4 mg Intravenous Q6H PRN Thornton Park, MD      . oxyCODONE (Oxy IR/ROXICODONE) immediate release tablet 10-15 mg  10-15 mg Oral Q4H PRN Thornton Park, MD      . oxyCODONE (Oxy IR/ROXICODONE) immediate release tablet 5-10 mg  5-10 mg Oral Q4H PRN Thornton Park, MD   5 mg at 03/19/18 1430  . [START ON 03/20/2018] pneumococcal 23 valent vaccine (PNU-IMMUNE) injection 0.5 mL  0.5 mL Intramuscular Tomorrow-1000 Thornton Park, MD      . Derrill Memo ON 03/20/2018] pravastatin (PRAVACHOL) tablet 40 mg  40 mg Oral q1800 Thornton Park, MD      . senna-docusate (Senokot-S) tablet 1 tablet  1 tablet Oral QHS PRN Thornton Park, MD      . sodium chloride 0.9 % bolus 500 mL  500 mL Intravenous Once Thornton Park, MD      . Derrill Memo ON 03/20/2018] tamsulosin (FLOMAX) capsule 0.4 mg  0.4 mg Oral QPC breakfast Thornton Park, MD      .  traMADol Veatrice Bourbon) tablet 50 mg  50 mg Oral Q6H Thornton Park, MD         Discharge Medications: Please see discharge summary for a list of discharge medications.  Relevant Imaging Results:  Relevant Lab Results:   Additional Information (SSN: [id number])  Jamarri Vuncannon, Veronia Beets, LCSW

## 2018-03-19 NOTE — Progress Notes (Signed)
Subjective:  POST-OP CHECK:  Patient reports left pain as mild to moderate.  Patient has had episodes of hypotension postoperatively.  A hospitalist consult has been ordered.  I spoke with Dr. Tressia Miners regarding this patient.  Patient has no other complaints.  Objective:   VITALS:   Vitals:   03/19/18 1236 03/19/18 1421 03/19/18 1515 03/19/18 1601  BP: 109/72 97/64 (!) 92/56 97/79  Pulse: 77 65 64 65  Resp: 18 18    Temp: (!) 97.5 F (36.4 C)   (!) 97.4 F (36.3 C)  TempSrc: Oral   Oral  SpO2: 96% 100% 98% 100%    PHYSICAL EXAM: Left lower extremity: Patient still has paresthesias in the lower extremity.  He is beginning to flex and extend and dorsiflex and plantarflex his ankle weakly.  The spinal is likely wearing off. Intact pulses distally Dorsiflexion/Plantar flexion intact Incision: dressing C/D/I No cellulitis present Compartment soft  LABS  Results for orders placed or performed during the hospital encounter of 03/19/18 (from the past 24 hour(s))  Glucose, capillary     Status: None   Collection Time: 03/19/18  7:05 AM  Result Value Ref Range   Glucose-Capillary 84 70 - 99 mg/dL  ABO/Rh     Status: None   Collection Time: 03/19/18  7:21 AM  Result Value Ref Range   ABO/RH(D)      B POS Performed at [facility], [address] [place], [place] [address]   Glucose, capillary     Status: None   Collection Time: 03/19/18 10:57 AM  Result Value Ref Range   Glucose-Capillary 83 70 - 99 mg/dL  Glucose, capillary     Status: Abnormal   Collection Time: 03/19/18 12:51 PM  Result Value Ref Range   Glucose-Capillary 62 (L) 70 - 99 mg/dL  Glucose, capillary     Status: Abnormal   Collection Time: 03/19/18  1:12 PM  Result Value Ref Range   Glucose-Capillary 57 (L) 70 - 99 mg/dL  CBC     Status: Abnormal   Collection Time: 03/19/18  1:27 PM  Result Value Ref Range   WBC 5.2 4.0 - 10.5 K/uL   RBC 4.45 4.22 - 5.81 MIL/uL   Hemoglobin 12.3 (L) 13.0 -  17.0 g/dL   HCT 38.6 (L) 39.0 - 52.0 %   MCV 86.7 80.0 - 100.0 fL   MCH 27.6 26.0 - 34.0 pg   MCHC 31.9 30.0 - 36.0 g/dL   RDW 13.0 11.5 - 15.5 %   Platelets 163 150 - 400 K/uL   nRBC 0.0 0.0 - 0.2 %  Creatinine, serum     Status: Abnormal   Collection Time: 03/19/18  1:27 PM  Result Value Ref Range   Creatinine, Ser 1.65 (H) 0.61 - 1.24 mg/dL   GFR calc non Af Amer 39 (L) >60 mL/min   GFR calc Af Amer 45 (L) >60 mL/min  Glucose, capillary     Status: Abnormal   Collection Time: 03/19/18  1:39 PM  Result Value Ref Range   Glucose-Capillary 55 (L) 70 - 99 mg/dL  Glucose, capillary     Status: Abnormal   Collection Time: 03/19/18  2:14 PM  Result Value Ref Range   Glucose-Capillary 121 (H) 70 - 99 mg/dL    Dg Knee Left Port  Result Date: 03/19/2018 CLINICAL DATA:  Status post left knee replacement EXAM: PORTABLE LEFT KNEE - 1-2 VIEW COMPARISON:  None. FINDINGS: Left knee prosthesis is seen. No loosening or acute  bony abnormality is noted. No soft tissue changes are seen. IMPRESSION: Status post left knee replacement Electronically Signed   By: Inez Catalina M.D.   On: 03/19/2018 11:33    Assessment/Plan: Day of Surgery   Active Problems:   S/P TKR (total knee replacement) using cement, left  Patient will be evaluated by the hospitalist service for his hypotension.  Otherwise he is doing well postop.  Patient will receive 24 hours of postop antibiotics.  Laboratory studies will be checked in the morning.  Patient will have his Foley catheter removed tomorrow morning.  Patient will begin Lovenox tomorrow for DVT prophylaxis.  Appreciate recommendations by diabetes coordinator.  Patient will continue physical therapy tomorrow.  He will continue to use his knee immobilizer with a towel roll under his ankle tonight to help with extension.  Reviewed the postoperative x-rays which show his total knee arthroplasty prosthesis is well-positioned.  There is no evidence of postop complication  including fracture dislocation or loosening of the prosthesis.    Thornton Park , MD 03/19/2018, 4:31 PM

## 2018-03-19 NOTE — Op Note (Signed)
DATE OF SURGERY:  03/19/2018 TIME: 11:13 AM  PATIENT NAME:  Keith Howe   AGE: 77 y.o.    PRE-OPERATIVE DIAGNOSIS:  OSTEOARTHRITIS OF LEFT KNEE   POST-OPERATIVE DIAGNOSIS:  Same  PROCEDURE:  Procedure(s): LEFT TOTAL KNEE ARTHROPLASTY  SURGEON:  Thornton Park, MD   ASSISTANT:  Carlynn Spry, PA  OPERATIVE IMPLANTS: Depuy PFC Sigma, Posterior Stabilized Femural component size 6, Tibia size rotating platform component size 6, Patella polyethylene 3-peg oval button size 38, with a 12.5 mm polyethylene insert.  EBL:  50  TOURNIQUET TIME:  125  PREOPERATIVE INDICATIONS:  Keith Howe is an 77 y.o. male who has a diagnosis of  OSTEOARTHRITIS OF LEFT KNEE  and elected for a left total knee arthroplasty after failing nonoperative treatment.  Their knee pain significantly impacts their activity of daily living.  Radiographs have demonstrated tricompartmental osteoarthritis joint space narrowing, osteophytes, and subchondral sclerosis.  The risks, benefits, and alternatives were discussed at length including but not limited to the risks of infection, bleeding, nerve or blood vessel injury, knee stiffness, fracture, dislocation, loosening or failure of the hardware and the need for further surgery. Medical risks include but not limited to DVT and pulmonary embolism, myocardial infarction, stroke, pneumonia, respiratory failure and death. I discussed these risks with the patient in my office prior to the date of surgery. They understood these risks and were willing to proceed.  OPERATIVE FINDINGS AND UNIQUE ASPECTS OF THE CASE: Tricompartmental osteoarthritis left knee  OPERATIVE DESCRIPTION:  The patient was brought to the operative room and placed in a supine position after undergoing placement of a spinal anesthetic.  A Foley catheter was placed.  IV antibiotics were given. Patient received ancef and clindamycin. The lower extremity was prepped and draped in the usual sterile  fashion.  A time out was performed to verify the patient's name, date of birth, medical record number, correct site of surgery and correct procedure to be performed. The timeout was also used to confirm the patient received antibiotics and that appropriate instruments, implants and radiographs studies were available in the room.  The leg was elevated and exsanguinated with an Esmarch and the tourniquet was inflated to 275 mmHg for 125 minutes..  A midline incision was made over the left knee. Full-thickness skin flaps were developed. A medial parapatellar arthrotomy was then made and the patella everted and the knee was brought into 90 of flexion. Hoffa's fat pad along with the cruciate ligaments and medial and lateral menisci were resected.   The distal femoral intramedullary canal was opened with a drill and the intramedullary distal femoral cutting jig was inserted into the femoral canal pinned into position. It was set at 5 degrees resecting 10 mm off the distal femur.  Care was taken to protect the collateral ligaments during distal femoral resection.  The distal femoral resection was performed with an oscillating saw. The femoral cutting guide was then removed.  The extramedullary tibial cutting guide was then placed using the anterior tibial crest and second ray of the foot as a references.  The tibial cutting guide was adjusted to allow for appropriate posterior slope.  The tibial cutting block was pinned into position. The slotted stylus was used to measure the proximal tibial resection of 10 mm off the high lateral side.  The tibial long rod alignment guide was then used to confirm position of the cutting block. A third cross pin through the tibial cutting block was then drilled into position to allow  for rotational stability. Care was taken during the tibial resection to protect the medial and collateral ligaments.  The resected tibial bone was removed along with the posterior horns of the menisci.   The PCL was sacrificed.  Extension gap was measured with a spacer block and alignment and extension was confirmed using a long alignment rod.  The attention was then turned back to the femur. The posterior referencing distal femoral sizing guide was applied to the distal femur.  The femur was sized to be a size 6. Rotation of the referencing guide was checked with the epicondylar axis and Whitesides line. Then the 4-in-1 cutting jig was then applied to the distal femur. A stylus was used to confirm that the anterior femur would not be notched.   Then the anterior, posterior and chamfer femoral cuts were then made with an oscillating saw.  The flexion gap was then measured with a flexion spacer block and long alignment rod and was found to be symmetric with the extension gap and perpendicular to mechanical axis of the tibia.  The distal femoral preparation was completed by performing the posterior stabilized box cut using the cutting block. The entry site for the intramedullary femoral guide was filled with autologous bone graft from bone previously resected earlier in the case.  The proximal tibia plateau was then sized with trial trays. The best coverage was achieved with a size 6. This tibial tray was then pinned into position. The proximal tibia was then prepared with the reamer and keel punch.  After tibial preparation was completed, all trial components were inserted with polyethylene trials.  The knee was found to have excellent balance and full motion with a size 12.5 mm tibial polyethylene insert..    The attention was then turned to preparation of the patella. The thickness of the patella was measured with a caliper, the diameter measured with the patella templates.  The patella resection was then made with an oscillating saw using the patella cutting guide. 3 peg holes for the patella component were then drilled. The trial patella was then placed. Knee was taken through a full range of motion and  deemed to be stable with the trial components. All trial components were then removed. The knee capsule was then injected with Exparel.  The knee joint capsule was injected with a mixture of quarter percent Marcaine and morphine to assist with postoperative pain relief.  The joint was copiously irrigated with pulse lavage.  The final total knee arthroplasty components were then cemented into place with a 12.5 mm trial polyethylene insert and all excess methylmethacrylate was removed.  The joint was again copiously irrigated. After the cement had hardened the knee was again taken through a full range of motion. It was felt to be most stable with the 12.5 mm tibial polyethylene insert. The actual tibial polyethylene insert was then placed.   The knee was taken through a range of motion and the patella tracked well and the knee was again irrigated copiously.    The medial arthrotomy was closed with #1 Ethibond. The subcutaneous tissue closed with 0 and 2-0 vicryl, and skin approximated with staples.  A dry sterile and compressive dressing was applied.  A Polar Care was applied to the operative knee along with a knee immobilizer.  The patient was awakened and brought to the PACU in stable and satisfactory condition.  All sharp, lap and instrument counts were correct at the conclusion the case. I spoke with the patient's family in  the postop consultation room to let them know the case had been performed without complication and the patient was stable in recovery room.

## 2018-03-19 NOTE — Progress Notes (Signed)
Anticoagulation monitoring(Lovenox):  77yo  male ordered Lovenox 40 mg Q12h  BMI 31.6   Lab Results  Component Value Date   CREATININE 1.71 (H) 03/06/2018   CREATININE 1.60 (H) 07/06/2015   Estimated Creatinine Clearance: 47.5 mL/min (A) (by C-G formula based on SCr of 1.71 mg/dL (H)). Hemoglobin & Hematocrit     Component Value Date/Time   HGB 14.1 03/06/2018 1347   HCT 42.9 03/06/2018 1347     Per Protocol for Patient with estCrcl > 30 ml/min and BMI < 40, will transition to Lovenox 40 mg Q24h.

## 2018-03-19 NOTE — H&P (Signed)
PREOPERATIVE H&P  Chief Complaint: OSTEOARTHRITIS OF LEFT KNEE   HPI: Keith Howe is a 77 y.o. male who presents for preoperative history and physical with a diagnosis of OSTEOARTHRITIS OF LEFT KNEE. Symptoms of pain are rated as moderate to severe, and have been worsening.  This is significantly impairing activities of daily living occluding ambulation.  He has elected for surgical management after failing nonoperative treatment.   Past Medical History:  Diagnosis Date  . BPH (benign prostatic hyperplasia)   . Diabetes mellitus without complication ([place])   . HOH (hard of hearing)    AIDS  . Hypertension   . Pain    CHRONIC LBP   Past Surgical History:  Procedure Laterality Date  . BACK SURGERY    . CATARACT EXTRACTION W/PHACO Right 07/10/2017   Procedure: CATARACT EXTRACTION PHACO AND INTRAOCULAR LENS PLACEMENT (IOC);  Surgeon: Birder Robson, MD;  Location: [facility] ORS;  Service: Ophthalmology;  Laterality: Right;  [place] 00:29.2 AP% 16.5 CDE 4.83 Fluid Pack Lot # [id number]  . COLONOSCOPY    . EYE SURGERY    . HERNIA REPAIR     Social History   Socioeconomic History  . Marital status: Married    Spouse name: Not on file  . Number of children: Not on file  . Years of education: Not on file  . Highest education level: Not on file  Occupational History  . Not on file  Social Needs  . Financial resource strain: Not on file  . Food insecurity:    Worry: Not on file    Inability: Not on file  . Transportation needs:    Medical: Not on file    Non-medical: Not on file  Tobacco Use  . Smoking status: Never Smoker  . Smokeless tobacco: Never Used  Substance and Sexual Activity  . Alcohol use: No    Frequency: Never  . Drug use: Not on file  . Sexual activity: Not on file  Lifestyle  . Physical activity:    Days per week: Not on file    Minutes per session: Not on file  . Stress: Not on file  Relationships  . Social connections:    Talks on phone: Not on file     Gets together: Not on file    Attends religious service: Not on file    Active member of club or organization: Not on file    Attends meetings of clubs or organizations: Not on file    Relationship status: Not on file  Other Topics Concern  . Not on file  Social History Narrative  . Not on file   History reviewed. No pertinent family history. No Known Allergies Prior to Admission medications   Medication Sig Start Date End Date Taking? Authorizing Provider  aspirin EC 81 MG tablet Take 81 mg by mouth daily.   Yes [provider]  cholecalciferol (VITAMIN D) 1000 units tablet Take 1,000 Units by mouth daily.   Yes [provider]  dutasteride (AVODART) 0.5 MG capsule Take 0.5 mg by mouth daily. 06/21/17  Yes [provider]  lisinopril-hydrochlorothiazide (PRINZIDE,ZESTORETIC) 20-12.5 MG tablet Take 2 tablets by mouth daily.   Yes [provider]  lovastatin (MEVACOR) 40 MG tablet Take 40 mg by mouth every morning.   Yes [provider]  metoprolol tartrate (LOPRESSOR) 50 MG tablet Take 50 mg by mouth 2 (two) times daily.   Yes [provider]  Misc Natural Products (PROSTATE SUPPORT PO) Take 2 capsules by  mouth daily.   Yes [provider]  NOVOLOG FLEXPEN 100 UNIT/ML FlexPen Inject 7 Units into the skin 3 (three) times daily before meals. 01/03/18  Yes [provider]  Omega-3 Fatty Acids (FISH OIL) 1000 MG CAPS Take 1,000 mg by mouth daily.    Yes [provider]  tamsulosin (FLOMAX) 0.4 MG CAPS capsule Take 0.4 mg by mouth daily after breakfast.    Yes [provider]  TRESIBA FLEXTOUCH 200 UNIT/ML SOPN Inject 80 Units into the muscle every morning. 06/20/17  Yes [provider]  TRULICITY 1.5 YF/1.1MY SOPN Inject 1.5 mg into the muscle every Wednesday.  04/17/17  Yes [provider]     Positive ROS: All other systems have been reviewed and were otherwise negative with the  exception of those mentioned in the HPI and as above.  Physical Exam: General: Alert, no acute distress Cardiovascular: Regular rate and rhythm, no murmurs rubs or gallops.  No pedal edema Respiratory: Clear to auscultation bilaterally, no wheezes rales or rhonchi. No cyanosis, no use of accessory musculature GI: No organomegaly, abdomen is soft and non-tender nondistended with positive bowel sounds. Skin: Skin intact, no lesions within the operative field. Neurologic: Sensation intact distally Psychiatric: Patient is competent for consent with normal mood and affect Lymphatic: No  cervical lymphadenopathy  MUSCULOSKELETAL: Left knee ROM -10 to 110.  Tender to the medial joint line.  Patient has no ligamentous laxity.  He has patellofemoral crepitus and mild grind but no apprehension.  He has no calf tenderness or lower leg edema.  He had palpable pedal pulses, intact sensation light touch and intact motor function without weakness.  Assessment: OSTEOARTHRITIS OF LEFT KNEE   Plan: Plan for Procedure(s): LEFT TOTAL KNEE ARTHROPLASTY  I discussed the patient in my office prior to surgery the details of total knee arthroplasty operation.  I used a 3-dimensional model to explain the surgery to him.  We also discussed at length the postoperative course.  I also discussed the risks and benefits of surgery. The patient understands the risks include but are not limited to infection, bleeding requiring blood transfusion, nerve or blood vessel injury, joint stiffness or loss of motion, persistent pain, weakness or instability, malunion, nonunion and hardware failure and the need for further surgery. Medical risks include but are not limited to DVT and pulmonary embolism, myocardial infarction, stroke, pneumonia, respiratory failure and death. Patient understood these risks and wished to proceed.    Thornton Park, MD   03/19/2018 7:36 AM

## 2018-03-19 NOTE — Care Management (Signed)
This patient is not a medicare bundle patient.

## 2018-03-19 NOTE — Progress Notes (Signed)
Bolus for low blood pressure

## 2018-03-20 ENCOUNTER — Encounter: Payer: Self-pay | Admitting: Orthopedic Surgery

## 2018-03-20 DIAGNOSIS — I10 Essential (primary) hypertension: Secondary | ICD-10-CM | POA: Diagnosis not present

## 2018-03-20 LAB — CBC
HCT: 31.3 % — ABNORMAL LOW (ref 39.0–52.0)
HEMOGLOBIN: 10.3 g/dL — AB (ref 13.0–17.0)
MCH: 27.8 pg (ref 26.0–34.0)
MCHC: 32.9 g/dL (ref 30.0–36.0)
MCV: 84.4 fL (ref 80.0–100.0)
Platelets: 152 10*3/uL (ref 150–400)
RBC: 3.71 MIL/uL — AB (ref 4.22–5.81)
RDW: 12.9 % (ref 11.5–15.5)
WBC: 9 10*3/uL (ref 4.0–10.5)
nRBC: 0 % (ref 0.0–0.2)

## 2018-03-20 LAB — GLUCOSE, CAPILLARY
GLUCOSE-CAPILLARY: 118 mg/dL — AB (ref 70–99)
GLUCOSE-CAPILLARY: 229 mg/dL — AB (ref 70–99)
GLUCOSE-CAPILLARY: 274 mg/dL — AB (ref 70–99)
Glucose-Capillary: 278 mg/dL — ABNORMAL HIGH (ref 70–99)

## 2018-03-20 LAB — BASIC METABOLIC PANEL
Anion gap: 7 (ref 5–15)
BUN: 30 mg/dL — ABNORMAL HIGH (ref 8–23)
CO2: 27 mmol/L (ref 22–32)
CREATININE: 1.63 mg/dL — AB (ref 0.61–1.24)
Calcium: 8.3 mg/dL — ABNORMAL LOW (ref 8.9–10.3)
Chloride: 105 mmol/L (ref 98–111)
GFR, EST AFRICAN AMERICAN: 46 mL/min — AB (ref >60)
GFR, EST NON AFRICAN AMERICAN: 39 mL/min — AB (ref >60)
Glucose, Bld: 164 mg/dL — ABNORMAL HIGH (ref 70–99)
POTASSIUM: 4.7 mmol/L (ref 3.5–5.1)
SODIUM: 139 mmol/L (ref 135–145)

## 2018-03-20 LAB — TROPONIN I: Troponin I: <0.03 ng/mL (ref <0.03)

## 2018-03-20 MED ORDER — METOPROLOL TARTRATE 25 MG PO TABS: 25.0000 mg | ORAL_TABLET | Freq: Two times a day (BID) | ORAL | Status: DC

## 2018-03-20 MED ORDER — SODIUM CHLORIDE 0.9 % IV BOLUS
1000.0000 mL | Freq: Once | INTRAVENOUS | Status: AC
  Administered 2018-03-20: 1000 mL via INTRAVENOUS

## 2018-03-20 NOTE — Progress Notes (Signed)
Subjective:  POD #1 s/p left TKA.  The patient was seen with his nurse today.  His wife is at the bedside as well.  Patient reports left knee pain as mild to moderate.  Patient had an episode this morning with physical therapy where he felt lightheaded and diaphoretic.  Hospitalist was contacted and the patient was ordered for a bolus of fluid.  He feels well now sitting up in bed.  The patient is using a computer when I arrived in the room.  Objective:   VITALS:   Vitals:   03/20/18 0001 03/20/18 0341 03/20/18 0820 03/20/18 1210  BP: 123/72 119/70 106/61 124/70  Pulse: 90 85 76 78  Resp: 17 (!) 22    Temp: 98.8 F (37.1 C) 98.7 F (37.1 C) 98.8 F (37.1 C) 98.6 F (37 C)  TempSrc: Oral Oral Oral Oral  SpO2: 100% 99% 100% 99%    PHYSICAL EXAM: Left lower extremity: Patient had a small amount of blood visualized on his outer dressing on the medial aspect of the lower leg.  Patient's compartments are soft.  He has palpable pedal pulses, intact sensation light touch and intact motor function without weakness. Neurovascular intact Sensation intact distally Intact pulses distally Dorsiflexion/Plantar flexion intact Incision: moderate drainage No cellulitis present Compartment soft  LABS  Results for orders placed or performed during the hospital encounter of 03/19/18 (from the past 24 hour(s))  Glucose, capillary     Status: Abnormal   Collection Time: 03/19/18 12:51 PM  Result Value Ref Range   Glucose-Capillary 62 (L) 70 - 99 mg/dL  Glucose, capillary     Status: Abnormal   Collection Time: 03/19/18  1:12 PM  Result Value Ref Range   Glucose-Capillary 57 (L) 70 - 99 mg/dL  CBC     Status: Abnormal   Collection Time: 03/19/18  1:27 PM  Result Value Ref Range   WBC 5.2 4.0 - 10.5 K/uL   RBC 4.45 4.22 - 5.81 MIL/uL   Hemoglobin 12.3 (L) 13.0 - 17.0 g/dL   HCT 38.6 (L) 39.0 - 52.0 %   MCV 86.7 80.0 - 100.0 fL   MCH 27.6 26.0 - 34.0 pg   MCHC 31.9 30.0 - 36.0 g/dL   RDW  13.0 11.5 - 15.5 %   Platelets 163 150 - 400 K/uL   nRBC 0.0 0.0 - 0.2 %  Creatinine, serum     Status: Abnormal   Collection Time: 03/19/18  1:27 PM  Result Value Ref Range   Creatinine, Ser 1.65 (H) 0.61 - 1.24 mg/dL   GFR calc non Af Amer 39 (L) >60 mL/min   GFR calc Af Amer 45 (L) >60 mL/min  Glucose, capillary     Status: Abnormal   Collection Time: 03/19/18  1:39 PM  Result Value Ref Range   Glucose-Capillary 55 (L) 70 - 99 mg/dL  Glucose, capillary     Status: Abnormal   Collection Time: 03/19/18  2:14 PM  Result Value Ref Range   Glucose-Capillary 121 (H) 70 - 99 mg/dL  Glucose, capillary     Status: Abnormal   Collection Time: 03/19/18  4:45 PM  Result Value Ref Range   Glucose-Capillary 267 (H) 70 - 99 mg/dL   Comment 1 Notify RN   Glucose, capillary     Status: Abnormal   Collection Time: 03/19/18  9:30 PM  Result Value Ref Range   Glucose-Capillary 189 (H) 70 - 99 mg/dL  CBC     Status: Abnormal  Collection Time: 03/20/18  3:48 AM  Result Value Ref Range   WBC 9.0 4.0 - 10.5 K/uL   RBC 3.71 (L) 4.22 - 5.81 MIL/uL   Hemoglobin 10.3 (L) 13.0 - 17.0 g/dL   HCT 31.3 (L) 39.0 - 52.0 %   MCV 84.4 80.0 - 100.0 fL   MCH 27.8 26.0 - 34.0 pg   MCHC 32.9 30.0 - 36.0 g/dL   RDW 12.9 11.5 - 15.5 %   Platelets 152 150 - 400 K/uL   nRBC 0.0 0.0 - 0.2 %  Basic metabolic panel     Status: Abnormal   Collection Time: 03/20/18  3:48 AM  Result Value Ref Range   Sodium 139 135 - 145 mmol/L   Potassium 4.7 3.5 - 5.1 mmol/L   Chloride 105 98 - 111 mmol/L   CO2 27 22 - 32 mmol/L   Glucose, Bld 164 (H) 70 - 99 mg/dL   BUN 30 (H) 8 - 23 mg/dL   Creatinine, Ser 1.63 (H) 0.61 - 1.24 mg/dL   Calcium 8.3 (L) 8.9 - 10.3 mg/dL   GFR calc non Af Amer 39 (L) >60 mL/min   GFR calc Af Amer 46 (L) >60 mL/min   Anion gap 7 5 - 15  Glucose, capillary     Status: Abnormal   Collection Time: 03/20/18  8:22 AM  Result Value Ref Range   Glucose-Capillary 118 (H) 70 - 99 mg/dL   Comment 1  Notify RN   Troponin I - Once     Status: None   Collection Time: 03/20/18  9:37 AM  Result Value Ref Range   Troponin I <0.03 <0.03 ng/mL  Glucose, capillary     Status: Abnormal   Collection Time: 03/20/18 11:42 AM  Result Value Ref Range   Glucose-Capillary 229 (H) 70 - 99 mg/dL   Comment 1 Notify RN     Dg Knee Left Port  Result Date: 03/19/2018 CLINICAL DATA:  Status post left knee replacement EXAM: PORTABLE LEFT KNEE - 1-2 VIEW COMPARISON:  None. FINDINGS: Left knee prosthesis is seen. No loosening or acute bony abnormality is noted. No soft tissue changes are seen. IMPRESSION: Status post left knee replacement Electronically Signed   By: Inez Catalina M.D.   On: 03/19/2018 11:33    Assessment/Plan: 1 Day Post-Op   Active Problems:   S/P TKR (total knee replacement) using cement, left  Patient will attempt physical therapy again this afternoon.  His blood pressure and heart rate are within normal limits.  His hemoglobin is 10.3 and hematocrit 31.3.  Patient will start Lovenox today for DVT prophylaxis.  His Foley catheter has been removed.  He will continue on current pain management.    Thornton Park , MD 03/20/2018, 12:44 PM

## 2018-03-20 NOTE — Evaluation (Signed)
Occupational Therapy Evaluation Patient Details Name: CID AGENA MRN: [id number] DOB: 24-Aug-1940 Today's Date: 03/20/2018    History of Present Illness admitted for acute hospitalization status post L TKR (03/19/18), WBAT.   Clinical Impression   Pt seen for OT evaluation this date, POD#1 from above surgery. Pt was independent in all ADLs prior to surgery, however occasionally using a SPC for mobility in longer community distances due to L knee pain. Pt is eager to return to PLOF with less pain and improved safety and independence. Pt currently requires moderate assist for LB dressing in sit>stand position and moderate assist for LB bathing while in seated position due to pain and limited AROM of L knee. Pt instructed in polar care mgt, falls prevention strategies, home/routines modifications, DME/AE for LB bathing and dressing tasks, and compression stocking mgt. Pt verbalized understanding of education presented this date. Pt orthostatic upon standing in prior PT session so BP watched closely in this OT session.  BP 125/64 (supine); BP 113/76 (EOB with Min A for LLE movement off bed); BP 114/99 (stand with Min/Mod A, pt took 4-5 side steps with MinA and VC for foot placement, then  pt became diaphoretic);  Pt returned to supine with MinA. BP 88/54, HR 76 (supine); BP 131/66 with trendelenberg (2nd supine reading); BP 117/68 (3rd supine reading). RN and MD in room to assess. MD requested hold on PT for AM session. Pt left in room with RN. Pt O2 stats 91-96% throughout session.   Pt would benefit from skilled OT services including additional instruction in dressing techniques with or without assistive devices for dressing and bathing skills to support recall and carryover prior to discharge and ultimately to maximize safety, independence, and minimize falls risk and caregiver burden. OT recommends HHOT upon discharge pending medical stability.    Follow Up Recommendations  Home health OT     Equipment Recommendations  3 in 1 bedside commode    Recommendations for Other Services       Precautions / Restrictions Precautions Precautions: Fall Required Braces or Orthoses: (LLE KI when in bed) Restrictions Weight Bearing Restrictions: Yes LLE Weight Bearing: Weight bearing as tolerated      Mobility Bed Mobility Overal bed mobility: Needs Assistance Bed Mobility: Supine to Sit     Supine to sit: Min assist     General bed mobility comments: Pt needed min A for moving LLE off bed  Transfers   Equipment used: Rolling walker (2 wheeled) Transfers: Sit to/from Stand Sit to Stand: Min assist;Mod assist         General transfer comment: slow movements, minimal WBing in LLE, VC for hand/foot placement    Balance Overall balance assessment: Needs assistance Sitting-balance support: No upper extremity supported;Feet supported Sitting balance-Leahy Scale: Good     Standing balance support: Bilateral upper extremity supported;Single extremity supported(Pt able to remove one extremity at a time from RW) Standing balance-Leahy Scale: Fair                             ADL either performed or assessed with clinical judgement   ADL Overall ADL's : Needs assistance/impaired Eating/Feeding: Independent;Sitting   Grooming: Sitting;Min guard Grooming Details (indicate cue type and reason): sitting EOB Upper Body Bathing: Min guard;Sitting   Lower Body Bathing: Moderate assistance;Sitting/lateral leans   Upper Body Dressing : Min guard;Sitting   Lower Body Dressing: Moderate assistance;Sit to/from stand;With adaptive equipment   Toilet Transfer: Minimal  assistance           Functional mobility during ADLs: (deferred)       Vision Baseline Vision/History: Cataracts Patient Visual Report: No change from baseline       Perception     Praxis      Pertinent Vitals/Pain Pain Score: 6  Pain Descriptors / Indicators: Dull Pain  Intervention(s): Limited activity within patient's tolerance;Monitored during session;Repositioned;Ice applied;Premedicated before session     Hand Dominance     Extremity/Trunk Assessment Upper Extremity Assessment Upper Extremity Assessment: Overall WFL for tasks assessed(Pt able to manipulate all items on tray during breakfast)   Lower Extremity Assessment Lower Extremity Assessment: LLE deficits/detail LLE Deficits / Details: post op deficencies expected       Communication Communication Communication: No difficulties   Cognition Arousal/Alertness: Awake/alert Behavior During Therapy: WFL for tasks assessed/performed Overall Cognitive Status: Within Functional Limits for tasks assessed                                     General Comments   Pt orthostatic upon standing in prior PT session so BP watched closely in this OT session.  BP 125/64 (supine); BP 113/76 (EOB with Min A for LLE movement off bed); BP 114/99 (stand with Min/Mod A, pt took 4-5 side steps with MinA and VC for foot placement, then  pt became diaphoretic);  Pt returned to supine with MinA. BP 88/54, HR 76 (supine); BP 131/66 with trendelenberg (2nd supine reading); BP 117/68 (3rd supine reading). RN and MD in room to assess. MD requested hold on PT for AM session. Pt left in room with RN. Pt O2 stats 91-96% throughout session    Exercises Other Exercises Other Exercises: Pt educated in polar care mgt donning/doffing, wear schedule and positioning Other Exercises: Pt educated in compression sock mgt donning/doffing, wear schedule and positioning Other Exercises: Pt educated in use of AE for  LB dressing tasks Other Exercises: Pt educated in falls prevention strategies when completing ADL and IADL tasks.    Shoulder Instructions      Home Living Family/patient expects to be discharged to:: Private residence Living Arrangements: Spouse/significant other Available Help at Discharge:  Family;Available 24 hours/day Type of Home: House Home Access: Stairs to enter [employer] of Steps: 2; pt has access to back entrance w/o steps   Home Layout: Multi-level;Able to live on main level with bedroom/bathroom Alternate Level Stairs-Number of Steps: 6 steps from kitchen to master bedroom (bilat rails, can hold both)   Bathroom Shower/Tub: Occupational psychologist: Handicapped height     Home Equipment: [place] - single point          Prior Functioning/Environment Level of Independence: Independent        Comments: Indep without assist device for ADLs, household and community mobilization; intermittent use of SPC for longer, community distances due to worsening L knee pain; pt enjoys yardwork and fixing cars        OT Problem List: Decreased strength;Impaired balance (sitting and/or standing);Pain;Decreased range of motion;Cardiopulmonary status limiting activity;Decreased activity tolerance;Decreased knowledge of use of DME or AE      OT Treatment/Interventions: Self-care/ADL training;DME and/or AE instruction;Therapeutic activities;Balance training;Therapeutic exercise;Patient/family education    OT Goals(Current goals can be found in the care plan section) Acute Rehab OT Goals Patient Stated Goal: to get back to fixing cars OT Goal Formulation: With patient Time For Goal  Achievement: 04/03/18 Potential to Achieve Goals: Good ADL Goals Pt Will Perform Lower Body Dressing: with min guard assist;sit to/from stand;with adaptive equipment Additional ADL Goal #1: Pt will educate caregiver in polar care mgt including donning/doffing, wear schedule and positioning Additional ADL Goal #2: Pt will educate caregiver in compression sock mgt including donning/doffing, wear schedule and positioning Additional ADL Goal #3: Pt will utilize at least 2 falls prevention strategies while completing ADL tasks.  OT Frequency: Min 1X/week   Barriers to D/C:             Co-evaluation              AM-PAC PT "6 Clicks" Daily Activity     Outcome Measure Help from another person eating meals?: None Help from another person taking care of personal grooming?: None Help from another person toileting, which includes using toliet, bedpan, or urinal?: A Little Help from another person bathing (including washing, rinsing, drying)?: A Lot Help from another person to put on and taking off regular upper body clothing?: A Little Help from another person to put on and taking off regular lower body clothing?: A Lot 6 Click Score: 18   End of Session Equipment Utilized During Treatment: Gait belt;Rolling walker  Activity Tolerance: Treatment limited secondary to medical complications (Comment)(Pt orthostatic upon standing) Patient left: in bed;with call bell/phone within reach;with SCD's reapplied(RN in room )  OT Visit Diagnosis: Other abnormalities of gait and mobility (R26.89);Pain Pain - Right/Left: Left Pain - part of body: Knee                Time: 0820-0930 OT Time Calculation (min): 70 min Charges:     Jadene Pierini OTS  03/20/2018, 11:49 AM

## 2018-03-20 NOTE — Anesthesia Postprocedure Evaluation (Signed)
Anesthesia Post Note  Patient: Keith Howe  Procedure(s) Performed: TOTAL KNEE ARTHROPLASTY (Left )  Patient location during evaluation: Nursing Unit Anesthesia Type: Spinal Level of consciousness: awake, awake and alert and oriented Pain management: pain level controlled Vital Signs Assessment: post-procedure vital signs reviewed and stable Respiratory status: spontaneous breathing Cardiovascular status: blood pressure returned to baseline Postop Assessment: no headache Anesthetic complications: no Comments: Moves feet and legs, catheter recently removed, has not voided yet     Last Vitals:  Vitals:   03/20/18 0001 03/20/18 0341  BP: 123/72 119/70  Pulse: 90 85  Resp: 17 (!) 22  Temp: 37.1 C 37.1 C  SpO2: 100% 99%    Last Pain:  Vitals:   03/20/18 0341  TempSrc: Oral  PainSc:                  Buckner [place]

## 2018-03-20 NOTE — Progress Notes (Signed)
Physical Therapy Treatment Patient Details Name: Keith Howe MRN: [id number] DOB: [birth year] Today's Date: 03/20/2018    History of Present Illness admitted for acute hospitalization status post L TKR (03/19/18), WBAT.    PT Comments    Vitals stable and WFL this PM, asymptomatic throughout session.  Tolerating increased activity level and demonstrating improved indep with all mobility tasks.  Able to complete full lap around nursing station with RW, cga-gait performance/mechanics and cadence improving throughout distance (though somewhat brisk/impulsive as fatigues sets in).   Will plan to initiate stair training next date (not required for home access at discharge-can stay in guest bedroom on main level of home; has 6 steps with bilat rails to access master bedroom)/   Follow Up Recommendations  Home health PT     Equipment Recommendations  Rolling walker with 5" wheels;3in1 (PT)    Recommendations for Other Services       Precautions / Restrictions Precautions Precautions: Fall Restrictions Weight Bearing Restrictions: Yes LLE Weight Bearing: Weight bearing as tolerated    Mobility  Bed Mobility Overal bed mobility: Modified Independent Bed Mobility: Supine to Sit     Supine to sit: Modified independent (Device/Increase time)     General bed mobility comments: able to complete without physical assist this date  Transfers Overall transfer level: Needs assistance Equipment used: Rolling walker (2 wheeled) Transfers: Sit to/from Stand Sit to Stand: Supervision         General transfer comment: encouraged for increased L knee flexion, increased active use with movement transitions  Ambulation/Gait Ambulation/Gait assistance: Min guard   Assistive device: Rolling walker (2 wheeled)       General Gait Details: step to gait pattern with short, shuffling and guarded steps progressing to step through gait pattern with improved clearance and step length.   Decreased cadence and overall gait speed; generally hesitant and unsure of gait.  Min cuing for L TKE in loading; decreased L toe off noted.  Cadence increasing as distances progresses; min cuing for walker positon/safer gait speed as fatigue increased   Stairs             Wheelchair Mobility    Modified Rankin (Stroke Patients Only)       Balance Overall balance assessment: Needs assistance Sitting-balance support: No upper extremity supported;Feet supported Sitting balance-Leahy Scale: Good     Standing balance support: Bilateral upper extremity supported;Single extremity supported Standing balance-Leahy Scale: Fair                              Cognition Arousal/Alertness: Awake/alert Behavior During Therapy: WFL for tasks assessed/performed Overall Cognitive Status: Within Functional Limits for tasks assessed                                        Exercises Total Joint Exercises Goniometric ROM: L knee: 5 -80 degrees Other Exercises Other Exercises: Sit/stand x5 with RW, cga/close sup-emphasis on hand placement, cuing for increased L knee flexion and increased active use of L LE with movement transition (tends to maintain anterior to BOS with limited WBing) Other Exercises: Orthostatic assessment (see vitals flowsheet)-asymptomatic; vitals stable and WFL. Other Exercises: Seated L knee therex, emphasis on flexion activities and end-range (limited by pain) stretching    General Comments        Pertinent Vitals/Pain Pain Assessment: Faces Faces Pain  Scale: Hurts little more Pain Location: L knee    Home Living                      Prior Function            PT Goals (current goals can now be found in the care plan section) Acute Rehab PT Goals Patient Stated Goal: to get back to fixing cars PT Goal Formulation: With patient/family Time For Goal Achievement: 04/02/18 Potential to Achieve Goals: Good Progress towards  PT goals: Progressing toward goals    Frequency    BID      PT Plan Current plan remains appropriate    Co-evaluation              AM-PAC PT "6 Clicks" Daily Activity  Outcome Measure  Difficulty turning over in bed (including adjusting bedclothes, sheets and blankets)?: None Difficulty moving from lying on back to sitting on the side of the bed? : None Difficulty sitting down on and standing up from a chair with arms (e.g., wheelchair, bedside commode, etc,.)?: Unable Help needed moving to and from a bed to chair (including a wheelchair)?: A Little Help needed walking in hospital room?: A Little Help needed climbing 3-5 steps with a railing? : A Lot 6 Click Score: 17    End of Session Equipment Utilized During Treatment: Gait belt Activity Tolerance: Patient tolerated treatment well Patient left: in chair;with call bell/phone within reach;with chair alarm set;with family/visitor present Nurse Communication: Mobility status PT Visit Diagnosis: Muscle weakness (generalized) (M62.81);Difficulty in walking, not elsewhere classified (R26.2);Pain Pain - Right/Left: Left Pain - part of body: Knee     Time: 3794-3276 PT Time Calculation (min) (ACUTE ONLY): 30 min  Charges:  $Gait Training: 8-22 mins $Therapeutic Exercise: 8-22 mins                    Janet Decesare H. Owens Shark, PT, DPT, NCS 03/20/18, 10:16 PM [phone]

## 2018-03-20 NOTE — Progress Notes (Signed)
Inpatient Diabetes Program Recommendations  AACE/ADA: New Consensus Statement on Inpatient Glycemic Control (2019)  Target Ranges:  Prepandial:   less than 140 mg/dL      Peak postprandial:   less than 180 mg/dL (1-2 hours)      Critically ill patients:  140 - 180 mg/dL  Results for Keith Howe, Keith Howe (MRN [id number]) as of 03/20/2018 12:02  Ref. Range 03/20/2018 08:22 03/20/2018 11:42  Glucose-Capillary Latest Ref Range: 70 - 99 mg/dL 118 (H) 229 (H)   Results for Keith Howe, Keith Howe (MRN [id number]) as of 03/20/2018 12:02  Ref. Range 03/19/2018 07:05 03/19/2018 10:57 03/19/2018 12:51 03/19/2018 13:12 03/19/2018 13:39 03/19/2018 14:14 03/19/2018 16:45 03/19/2018 21:30  Glucose-Capillary Latest Ref Range: 70 - 99 mg/dL 84 83 62 (L) 57 (L) 55 (L) 121 (H) 267 (H)  Novolog 5 units 189 (H)   Review of Glycemic Control  Diabetes history: DM2 Outpatient Diabetes medications: Tresiba 80 units QAM, Trulicity 1.5 mg Qweek (Wednesday), Novolog 7 units TID with meals Current orders for Inpatient glycemic control: Lantus 40 units daily, Novolog 0-9 units TID with meals, Novolog 0-5 units QHS, Trulicity 1.5 mg Qweek (Wednesday)  Inpatient Diabetes Program Recommendations:  Insulin - Basal: Noted Lantus was NOT GIVEN on 03/19/18 and fasting glucose was 118 mg/dl today. Anticipate [name] was still active yesterday (works up to 42 hours) and with patient being NPO for surgery that is likely cause of hypoglycemia yesterday. Continue Lantus 40 units QAM as ordered.  NOTE: Spoke with patient regarding DM control and to verify outpatient DM medications. Patient states that he is consistently taking Tresiba 80 units QAM, Novolog 7 units TID with meals, and Trulicity 1.5 mg Qweek on Wednesday. Patient states that his glucose is usually 80-120's mg/dl and he denies frequent hypoglycemia. Patient does report that if he does not eat consistently, he has an occasional hypoglycemic episode. Patient states he does not  adjust insulin dosages at all and takes them exactly as prescribed. Discussed hypoglycemia noted on 03/19/18 and patient states that he likely had hypoglycemia because he did not eat. Patient took [name] 80 units on 03/18/18 and it works for 42 hours. Discussed [name] duration with patient and discussed current insulin orders. Patient has not yet received Lantus today but is ordered Lantus 40 units QAM and since glucose is up to 267 mg/dl, will ask RN to be sure patient receives Lantus now.  Patient verbalized understanding of information discussed and states that he has no questions at this time.  Thanks, Keith Alderman, RN, MSN, CDE Diabetes Coordinator Inpatient Diabetes Program [phone] (Team Pager from 8am to 5pm)

## 2018-03-20 NOTE — Progress Notes (Signed)
PT Cancellation Note  Patient Details Name: Keith Howe MRN: [id number] DOB: 09-Mar-1941   Cancelled Treatment:    Reason Eval/Treat Not Completed: Medical issues which prohibited therapy(Chart reviewed for attempted treatment session.  Patient with significant hypotensive/orthostatic episode this AM during OT session.  Nursing currently at bedside for monitoring/care.  Per physician, will hold PT this AM and re-attempt in PM as medically appropriate and available.)   Niomie Englert H. Owens Shark, PT, DPT, NCS 03/20/18, 9:26 AM [phone]

## 2018-03-20 NOTE — Progress Notes (Signed)
[facility] [place] NAME: Keith Howe    MR#:  [id number]  DATE OF BIRTH:  08-Aug-1940  SUBJECTIVE:   Had an episode of hypotension this morning, with blood pressure 88/60.  Occurred right after working with OT.  Patient endorsed feeling a little bit lightheaded.  Patient denies any chest pain, shortness of breath, or palpitations.  REVIEW OF SYSTEMS:  Review of Systems  Constitutional: Positive for diaphoresis. Negative for chills and fever.  HENT: Negative for congestion and sore throat.   Eyes: Negative for blurred vision and double vision.  Respiratory: Negative for cough and shortness of breath.   Cardiovascular: Negative for chest pain, palpitations, orthopnea and leg swelling.  Gastrointestinal: Negative for abdominal pain, nausea and vomiting.  Genitourinary: Negative for dysuria, frequency and urgency.  Musculoskeletal: Negative for back pain and myalgias.  Neurological: Negative for dizziness and headaches.  Psychiatric/Behavioral: Negative for depression.    DRUG ALLERGIES:  No Known Allergies VITALS:  Blood pressure 106/61, pulse 76, temperature 98.8 F (37.1 C), temperature source Oral, resp. rate (!) 22, SpO2 100 %. PHYSICAL EXAMINATION:  Physical Exam  GENERAL:  77 y.o.-year-old obese patient lying in the bed with no acute distress. + Diaphoresis EYES: Pupils equal, round, reactive to light and accommodation. No scleral icterus. Extraocular muscles intact.  HEENT: Head atraumatic, normocephalic. Oropharynx and nasopharynx clear.  NECK:  Supple, no jugular venous distention. No thyroid enlargement, no tenderness.  LUNGS: Normal breath sounds bilaterally, no wheezing, rales,rhonchi or crepitation. No use of accessory muscles of respiration.  CARDIOVASCULAR: RRR, S1, S2 normal. No murmurs, rubs, or gallops.  ABDOMEN: Soft, nontender, nondistended. Bowel sounds present. No organomegaly or mass.  EXTREMITIES: No pedal  edema, cyanosis, or clubbing.   +Left leg immobilizer in place NEUROLOGIC: Cranial nerves II through XII are intact.  No focal weakness, sensation intact. Gait not checked.  PSYCHIATRIC: The patient is alert and oriented x 3.  SKIN: No obvious rash, lesion, or ulcer.  LABORATORY PANEL:  Male CBC Recent Labs  Lab 03/20/18 0348  WBC 9.0  HGB 10.3*  HCT 31.3*  PLT 152   ------------------------------------------------------------------------------------------------------------------ Chemistries  Recent Labs  Lab 03/20/18 0348  NA 139  K 4.7  CL 105  CO2 27  GLUCOSE 164*  BUN 30*  CREATININE 1.63*  CALCIUM 8.3*   RADIOLOGY:  Dg Knee Left Port  Result Date: 03/19/2018 CLINICAL DATA:  Status post left knee replacement EXAM: PORTABLE LEFT KNEE - 1-2 VIEW COMPARISON:  None. FINDINGS: Left knee prosthesis is seen. No loosening or acute bony abnormality is noted. No soft tissue changes are seen. IMPRESSION: Status post left knee replacement Electronically Signed   By: Keith Howe M.D.   On: 03/19/2018 11:33   ASSESSMENT AND PLAN:   1.  Hypotension- had another episode of hypotension this morning.  Likely secondary to dehydration.  No signs of infection. -Will give another 1 L bolus and continue IV fluids for today -Check EKG and troponin x1 -Holding lisinopril, HCTZ, metoprolol -UA pending -Monitor BPs closely -If he has persistent hypotension despite volume resuscitation, will consider obtaining ECHO  2.  Type 2 diabetes- blood sugars mostly well-controlled -Continue Lantus 40 units daily and sensitive SSI  3.  CKD stage III- stable.   -IVFs as above -Continue to monitor  4.  Osteoarthritis with s/p left total knee replacement on 11/12. -Management per orthopedics. -Pain control -PT/OT eval  5.  BPH- stable -Continue home medications  6.  DVT prophylaxis- Lovenox restarted today   All the records are reviewed and case discussed with Care Management/Social  Worker. Management plans discussed with the patient, family and they are in agreement.  CODE STATUS: Full Code  TOTAL TIME TAKING CARE OF THIS PATIENT: 32 minutes.   More than 50% of the time was spent in counseling/coordination of care: YES  POSSIBLE D/C IN 2-3 DAYS, DEPENDING ON CLINICAL CONDITION.   Keith Howe M.D on 03/20/2018 at 11:22 AM  Between 7am to 6pm - Pager - [phone]  After 6pm go to [email or web address] - Proofreader  [facility]  Office  [phone]  CC: Primary care physician; Keith [place], MD  Note: This dictation was prepared with Dragon dictation along with smaller phrase technology. Any transcriptional errors that result from this process are unintentional.

## 2018-03-21 LAB — BASIC METABOLIC PANEL
ANION GAP: 6 (ref 5–15)
BUN: 27 mg/dL — AB (ref 8–23)
CALCIUM: 8.5 mg/dL — AB (ref 8.9–10.3)
CO2: 27 mmol/L (ref 22–32)
CREATININE: 1.57 mg/dL — AB (ref 0.61–1.24)
Chloride: 103 mmol/L (ref 98–111)
GFR calc Af Amer: 48 mL/min — ABNORMAL LOW (ref >60)
GFR, EST NON AFRICAN AMERICAN: 41 mL/min — AB (ref >60)
GLUCOSE: 211 mg/dL — AB (ref 70–99)
POTASSIUM: 4.3 mmol/L (ref 3.5–5.1)
SODIUM: 136 mmol/L (ref 135–145)

## 2018-03-21 LAB — CBC
HCT: 29.9 % — ABNORMAL LOW (ref 39.0–52.0)
HEMOGLOBIN: 9.8 g/dL — AB (ref 13.0–17.0)
MCH: 27.5 pg (ref 26.0–34.0)
MCHC: 32.8 g/dL (ref 30.0–36.0)
MCV: 84 fL (ref 80.0–100.0)
NRBC: 0 % (ref 0.0–0.2)
Platelets: 149 10*3/uL — ABNORMAL LOW (ref 150–400)
RBC: 3.56 MIL/uL — AB (ref 4.22–5.81)
RDW: 12.6 % (ref 11.5–15.5)
WBC: 10.8 10*3/uL — ABNORMAL HIGH (ref 4.0–10.5)

## 2018-03-21 LAB — GLUCOSE, CAPILLARY
Glucose-Capillary: 186 mg/dL — ABNORMAL HIGH (ref 70–99)
Glucose-Capillary: 297 mg/dL — ABNORMAL HIGH (ref 70–99)
Glucose-Capillary: 235 mg/dL — ABNORMAL HIGH (ref 70–99)
Glucose-Capillary: 226 mg/dL — ABNORMAL HIGH (ref 70–99)

## 2018-03-21 MED ORDER — FLEET ENEMA 7-19 GM/118ML RE ENEM: 1.0000 | ENEMA | Freq: Every day | RECTAL | Status: DC | PRN

## 2018-03-21 MED ORDER — INSULIN GLARGINE 100 UNIT/ML ~~LOC~~ SOLN
45.0000 [IU] | Freq: Every day | SUBCUTANEOUS | Status: DC
  Administered 2018-03-22: 45 [IU] via SUBCUTANEOUS
  Filled 2018-03-21: qty 0.45

## 2018-03-21 MED ORDER — BISACODYL 10 MG RE SUPP: 10.0000 mg | Freq: Every day | RECTAL | Status: DC | PRN

## 2018-03-21 MED ORDER — INSULIN ASPART 100 UNIT/ML ~~LOC~~ SOLN
5.0000 [IU] | Freq: Three times a day (TID) | SUBCUTANEOUS | Status: DC
  Administered 2018-03-21 – 2018-03-24 (×6): 5 [IU] via SUBCUTANEOUS
  Filled 2018-03-21 (×6): qty 1

## 2018-03-21 MED ORDER — INSULIN ASPART 100 UNIT/ML ~~LOC~~ SOLN
0.0000 [IU] | Freq: Every day | SUBCUTANEOUS | Status: DC
  Administered 2018-03-21 – 2018-03-23 (×2): 2 [IU] via SUBCUTANEOUS
  Filled 2018-03-21 (×2): qty 1

## 2018-03-21 MED ORDER — METOPROLOL TARTRATE 25 MG PO TABS
25.0000 mg | ORAL_TABLET | Freq: Two times a day (BID) | ORAL | Status: DC
  Administered 2018-03-21 – 2018-03-24 (×7): 25 mg via ORAL
  Filled 2018-03-21 (×7): qty 1

## 2018-03-21 MED ORDER — INSULIN ASPART 100 UNIT/ML ~~LOC~~ SOLN
0.0000 [IU] | Freq: Three times a day (TID) | SUBCUTANEOUS | Status: DC
  Administered 2018-03-21: 5 [IU] via SUBCUTANEOUS
  Administered 2018-03-22: 3 [IU] via SUBCUTANEOUS
  Administered 2018-03-22 (×2): 5 [IU] via SUBCUTANEOUS
  Administered 2018-03-23: 2 [IU] via SUBCUTANEOUS
  Administered 2018-03-23 – 2018-03-24 (×3): 3 [IU] via SUBCUTANEOUS
  Filled 2018-03-21 (×8): qty 1

## 2018-03-21 NOTE — Care Management Note (Signed)
Case Management Note  Patient Details  Name: Keith Howe MRN: [id number] Date of Birth: Jun 24, 1940  Subjective/Objective:                  RNCM met with patient and his wife regarding transition of care. He plans to return to home and has follow up with [facility]. He is not certain if he will need home health services however his mother in law has used [facility] home health and that's who he prefers if needed. He will need a front-wheeled walker and bedside commode. His PCP is Dr. Lavera Guise.   Action/Plan:  Home health list provided.  Patient's wife will call RNCM with request for Home health if surgeon agrees.  DME requested from MD.  Expected Discharge Date:  03/22/18               Expected Discharge Plan:     In-House Referral:     Discharge planning Services     Post Acute Care Choice:    Choice offered to:     DME Arranged:    DME Agency:     HH Arranged:    HH Agency:     Status of Service:     If discussed at [employer], dates discussed:    Additional Comments:  Marshell Garfinkel, RN 03/21/2018, 3:22 PM

## 2018-03-21 NOTE — Progress Notes (Signed)
Physical Therapy Treatment Patient Details Name: Keith Howe MRN: [id number] DOB: 1940/09/27 Today's Date: 03/21/2018    History of Present Illness 77 y/o male status post L TKR (03/19/18), WBAT.    PT Comments    Pt showed good effort with PT session and was able to negotiate up/down steps, circumambulate the nurses' station.  He is pain limited and hesitant with some exercises, and does not have AROM TKE on the L and struggled engaging quads very well during the effort. He was eager to do what he could and generally did well despite some gait hesitancy and general limitations (quad/TKE).  Pt still appropriate for home when medically cleared for d/c.   Follow Up Recommendations  Home health PT     Equipment Recommendations  Rolling walker with 5" wheels;3in1 (PT)    Recommendations for Other Services       Precautions / Restrictions Precautions Precautions: Fall Restrictions Weight Bearing Restrictions: Yes LLE Weight Bearing: Weight bearing as tolerated    Mobility  Bed Mobility Overal bed mobility: Modified Independent Bed Mobility: Supine to Sit     Supine to sit: Supervision     General bed mobility comments: Pt did well getting to EOB, needed to use momentum, did not show overt hesitation  Transfers Overall transfer level: Needs assistance Equipment used: Rolling walker (2 wheeled) Transfers: Sit to/from Stand Sit to Stand: Supervision         General transfer comment: VCs for hand and LE placement, appropriate sequencing and generally to insure safety  Ambulation/Gait Ambulation/Gait assistance: Min guard Gait Distance (Feet): 250 Feet Assistive device: Rolling walker (2 wheeled)       General Gait Details: Pt initially with very stiff L knee with essentially no ROM and poor quality of motion. He did improve this with cuing/gait training, but still with shortended/choppy steppage and considerable reliance on the walker.    Stairs Stairs:  Yes Stairs assistance: Min guard Stair Management: Two rails Number of Stairs: 4 General stair comments: Pt was able to manange steps w/o physical assist and was safe with negotiation, but overall had some hesitancy and increased reliance on UEs/rail.  No LOBs or overt safety concerns with guarded/slow effort   Wheelchair Mobility    Modified Rankin (Stroke Patients Only)       Balance Overall balance assessment: Needs assistance Sitting-balance support: No upper extremity supported;Feet supported Sitting balance-Leahy Scale: Good       Standing balance-Leahy Scale: Fair Standing balance comment: clearly reliant on UEs/AD for balance                            Cognition Arousal/Alertness: Awake/alert Behavior During Therapy: WFL for tasks assessed/performed Overall Cognitive Status: Within Functional Limits for tasks assessed                                        Exercises Total Joint Exercises Ankle Circles/Pumps: AROM;10 reps Quad Sets: Strengthening;10 reps Gluteal Sets: Strengthening;10 reps Short Arc Quad: AROM;10 reps Heel Slides: 10 reps;AAROM Hip ABduction/ADduction: Strengthening;10 reps Straight Leg Raises: AROM;10 reps Knee Flexion: PROM;5 reps Goniometric ROM: 1-72    General Comments        Pertinent Vitals/Pain Pain Assessment: 0-10 Pain Score: 6     Home Living  Prior Function            PT Goals (current goals can now be found in the care plan section) Progress towards PT goals: Progressing toward goals    Frequency    BID      PT Plan Current plan remains appropriate    Co-evaluation              AM-PAC PT "6 Clicks" Daily Activity  Outcome Measure  Difficulty turning over in bed (including adjusting bedclothes, sheets and blankets)?: None Difficulty moving from lying on back to sitting on the side of the bed? : None Difficulty sitting down on and standing up  from a chair with arms (e.g., wheelchair, bedside commode, etc,.)?: A Little Help needed moving to and from a bed to chair (including a wheelchair)?: A Little Help needed walking in hospital room?: A Little Help needed climbing 3-5 steps with a railing? : A Little 6 Click Score: 20    End of Session Equipment Utilized During Treatment: Gait belt Activity Tolerance: Patient tolerated treatment well Patient left: in chair;with call bell/phone within reach;with chair alarm set;with family/visitor present Nurse Communication: Mobility status PT Visit Diagnosis: Muscle weakness (generalized) (M62.81);Difficulty in walking, not elsewhere classified (R26.2);Pain Pain - Right/Left: Left Pain - part of body: Knee     Time: 7371-0626 PT Time Calculation (min) (ACUTE ONLY): 28 min  Charges:  $Gait Training: 8-22 mins $Therapeutic Exercise: 8-22 mins                     Kreg Shropshire, DPT 03/21/2018, 12:18 PM

## 2018-03-21 NOTE — Progress Notes (Signed)
Subjective:  POD #2 s/p left total knee arthroplasty.  Patient reports left knee pain as mild.  Patient denies any chest pain, shortness of breath or abdominal pain.  He has no nausea or vomiting.  Wife is at the bedside.  + flatus, - BM.  Objective:   VITALS:   Vitals:   03/20/18 1210 03/20/18 2331 03/21/18 0758 03/21/18 1624  BP: 124/70 137/78 136/71 124/69  Pulse: 78 92 (!) 105 90  Resp:  19    Temp: 98.6 F (37 C) 98.4 F (36.9 C) 98.8 F (37.1 C) 98.4 F (36.9 C)  TempSrc: Oral Oral Oral Oral  SpO2: 99% 99% 97% 99%    PHYSICAL EXAM: Lower extremity: I personally changed the patient's dressing today.  Patient has no active drainage.  There is no significant ecchymosis and no erythema. Neurovascular intact Sensation intact distally Intact pulses distally Dorsiflexion/Plantar flexion intact Incision: no drainage No cellulitis present Compartment soft  LABS  Results for orders placed or performed during the hospital encounter of 03/19/18 (from the past 24 hour(s))  Glucose, capillary     Status: Abnormal   Collection Time: 03/20/18  9:08 PM  Result Value Ref Range   Glucose-Capillary 278 (H) 70 - 99 mg/dL  CBC     Status: Abnormal   Collection Time: 03/21/18  4:42 AM  Result Value Ref Range   WBC 10.8 (H) 4.0 - 10.5 K/uL   RBC 3.56 (L) 4.22 - 5.81 MIL/uL   Hemoglobin 9.8 (L) 13.0 - 17.0 g/dL   HCT 29.9 (L) 39.0 - 52.0 %   MCV 84.0 80.0 - 100.0 fL   MCH 27.5 26.0 - 34.0 pg   MCHC 32.8 30.0 - 36.0 g/dL   RDW 12.6 11.5 - 15.5 %   Platelets 149 (L) 150 - 400 K/uL   nRBC 0.0 0.0 - 0.2 %  Basic metabolic panel     Status: Abnormal   Collection Time: 03/21/18  4:42 AM  Result Value Ref Range   Sodium 136 135 - 145 mmol/L   Potassium 4.3 3.5 - 5.1 mmol/L   Chloride 103 98 - 111 mmol/L   CO2 27 22 - 32 mmol/L   Glucose, Bld 211 (H) 70 - 99 mg/dL   BUN 27 (H) 8 - 23 mg/dL   Creatinine, Ser 1.57 (H) 0.61 - 1.24 mg/dL   Calcium 8.5 (L) 8.9 - 10.3 mg/dL   GFR calc  non Af Amer 41 (L) >60 mL/min   GFR calc Af Amer 48 (L) >60 mL/min   Anion gap 6 5 - 15  Glucose, capillary     Status: Abnormal   Collection Time: 03/21/18  7:58 AM  Result Value Ref Range   Glucose-Capillary 186 (H) 70 - 99 mg/dL  Glucose, capillary     Status: Abnormal   Collection Time: 03/21/18 11:36 AM  Result Value Ref Range   Glucose-Capillary 297 (H) 70 - 99 mg/dL  Glucose, capillary     Status: Abnormal   Collection Time: 03/21/18  4:25 PM  Result Value Ref Range   Glucose-Capillary 235 (H) 70 - 99 mg/dL    No results found.  Assessment/Plan: 2 Days Post-Op   Active Problems:   S/P TKR (total knee replacement) using cement, left  Continue with physical therapy.  PT is currently recommending home health PT.  I would like to see how the patient does tomorrow with physical therapy before making a final decision.  He is currently scheduled for outpatient physical therapy  in our office on Monday.  Continue Lovenox 40 mg daily x 2 weeks for DVT prophylaxis.  Rolling walker and bedside commode DME ordered.   Thornton Park , MD 03/21/2018, 5:34 PM

## 2018-03-21 NOTE — Progress Notes (Signed)
Clinical Social Worker (CSW) received SNF consult. PT is recommending home health. RN case manager aware of above. Please reconsult if future social work needs arise. CSW signing off.   Chiquitta Matty, LCSW [phone] 

## 2018-03-21 NOTE — Progress Notes (Signed)
Inpatient Diabetes Program Recommendations  AACE/ADA: New Consensus Statement on Inpatient Glycemic Control (2019)  Target Ranges:  Prepandial:   less than 140 mg/dL      Peak postprandial:   less than 180 mg/dL (1-2 hours)      Critically ill patients:  140 - 180 mg/dL  Results for OVIDE, DUSEK (MRN [id number]) as of 03/21/2018 08:42  Ref. Range 03/20/2018 08:22 03/20/2018 11:42 03/20/2018 16:56 03/20/2018 21:08 03/21/2018 07:58  Glucose-Capillary Latest Ref Range: 70 - 99 mg/dL 118 (H) 229 (H) 274 (H) 278 (H) 186 (H)    Review of Glycemic Control  Diabetes history: DM2 Outpatient Diabetes medications: Tresiba 80 units QAM, Trulicity 1.5 mg Qweek (Wednesday), Novolog 7 units TID with meals Current orders for Inpatient glycemic control: Lantus 40 units daily, Novolog 0-9 units TID with meals, Novolog 0-5 units QHS  Inpatient Diabetes Program Recommendations:   Insulin - Basal: Please consider increasing Lantus to 45 units daily.  Insulin - Meal Coverage: Please consider ordering Novolog 5 units TID with meals for meal coverage if patient eats at least 50% of meals.  Thanks, Barnie Alderman, RN, MSN, CDE Diabetes Coordinator Inpatient Diabetes Program [phone] (Team Pager from 8am to 5pm)

## 2018-03-21 NOTE — Progress Notes (Signed)
Keith Howe NAME: Keith Howe    MR#:  [id number]  DATE OF BIRTH:  20-Jul-1940  SUBJECTIVE:   BPs much improved today. Walked around the nursing station today with PT without any issues. No dizziness or lightheadedness.  REVIEW OF SYSTEMS:  Review of Systems  Constitutional: Negative for chills, diaphoresis and fever.  HENT: Negative for congestion and sore throat.   Eyes: Negative for blurred vision and double vision.  Respiratory: Negative for cough and shortness of breath.   Cardiovascular: Negative for chest pain, palpitations, orthopnea and leg swelling.  Gastrointestinal: Negative for abdominal pain, nausea and vomiting.  Genitourinary: Negative for dysuria, frequency and urgency.  Musculoskeletal: Negative for back pain and myalgias.  Neurological: Negative for dizziness and headaches.  Psychiatric/Behavioral: Negative for depression.    DRUG ALLERGIES:  No Known Allergies VITALS:  Blood pressure 136/71, pulse (!) 105, temperature 98.8 F (37.1 C), temperature source Oral, resp. rate 19, SpO2 97 %. PHYSICAL EXAMINATION:  Physical Exam  GENERAL:  77 y.o.-year-old obese patient sitting up in bed with no acute distress. EYES: Pupils equal, round, reactive to light and accommodation. No scleral icterus. Extraocular muscles intact.  HEENT: Head atraumatic, normocephalic. Oropharynx and nasopharynx clear.  NECK:  Supple, no jugular venous distention. No thyroid enlargement, no tenderness.  LUNGS: Normal breath sounds bilaterally, no wheezing, rales,rhonchi or crepitation. No use of accessory muscles of respiration.  CARDIOVASCULAR: RRR, S1, S2 normal. No murmurs, rubs, or gallops.  ABDOMEN: Soft, nontender, nondistended. Bowel sounds present. No organomegaly or mass.  EXTREMITIES: No pedal edema, cyanosis, or clubbing.   +Left leg immobilizer in place NEUROLOGIC: Cranial nerves II through XII are intact.  No focal  weakness, sensation intact. Gait not checked.  PSYCHIATRIC: The patient is alert and oriented x 3.  SKIN: No obvious rash, lesion, or ulcer.  LABORATORY PANEL:  Male CBC Recent Labs  Lab 03/21/18 0442  WBC 10.8*  HGB 9.8*  HCT 29.9*  PLT 149*   ------------------------------------------------------------------------------------------------------------------ Chemistries  Recent Labs  Lab 03/21/18 0442  NA 136  K 4.3  CL 103  CO2 27  GLUCOSE 211*  BUN 27*  CREATININE 1.57*  CALCIUM 8.5*   RADIOLOGY:  No results found. ASSESSMENT AND PLAN:   1.  Hypotension- likely due to dehydration. Resolved with volume resuscitation. BP 136/71 this morning. -Stop IVFs -Restart metoprolol today -Continue holding lisinopril, HCTZ -Monitor BPs closely  2.  Type 2 diabetes- blood sugars have been high over the last 24 hours. -Increase Lantus to 45 units daily -Change to moderate SSI  3.  CKD stage III- stable, creatinine at baseline -Stop IVFs -Continue to monitor  4.  Osteoarthritis with s/p left total knee replacement on 11/12. -Management per orthopedics. -Pain control -PT/OT eval  5.  BPH- stable -Continue home medications  6.  DVT prophylaxis- Lovenox    All the records are reviewed and case discussed with Care Management/Social Worker. Management plans discussed with the patient, family and they are in agreement.  CODE STATUS: Full Code  TOTAL TIME TAKING CARE OF THIS PATIENT: 32 minutes.   More than 50% of the time was spent in counseling/coordination of care: YES  POSSIBLE D/C IN 1-2 DAYS, DEPENDING ON CLINICAL CONDITION.   Berna Spare Daisey Caloca M.D on 03/21/2018 at 12:17 PM  Between 7am to 6pm - Pager - [phone]  After 6pm go to [email or web address] - Proofreader  [employer]  [phone]  CC: Primary care physician; Cletis Athens, MD  Note: This dictation was prepared with Dragon dictation along with smaller  phrase technology. Any transcriptional errors that result from this process are unintentional.

## 2018-03-22 ENCOUNTER — Inpatient Hospital Stay: Payer: Medicare HMO

## 2018-03-22 LAB — IRON AND TIBC
IRON: 10 ug/dL — AB (ref 45–182)
SATURATION RATIOS: 4 % — AB (ref 17.9–39.5)
TIBC: 231 ug/dL — AB (ref 250–450)
UIBC: 221 ug/dL

## 2018-03-22 LAB — GLUCOSE, CAPILLARY
GLUCOSE-CAPILLARY: 189 mg/dL — AB (ref 70–99)
Glucose-Capillary: 217 mg/dL — ABNORMAL HIGH (ref 70–99)
Glucose-Capillary: 244 mg/dL — ABNORMAL HIGH (ref 70–99)
Glucose-Capillary: 139 mg/dL — ABNORMAL HIGH (ref 70–99)

## 2018-03-22 LAB — OCCULT BLOOD X 1 CARD TO LAB, STOOL: Fecal Occult Bld: NEGATIVE

## 2018-03-22 LAB — URINALYSIS, COMPLETE (UACMP) WITH MICROSCOPIC
Bilirubin Urine: NEGATIVE
Glucose, UA: NEGATIVE mg/dL
Ketones, ur: NEGATIVE mg/dL
Nitrite: NEGATIVE
Protein, ur: 30 mg/dL — AB
SPECIFIC GRAVITY, URINE: 1.013 (ref 1.005–1.030)
pH: 5 (ref 5.0–8.0)

## 2018-03-22 LAB — CBC
HEMATOCRIT: 25.3 % — AB (ref 39.0–52.0)
HEMOGLOBIN: 8.4 g/dL — AB (ref 13.0–17.0)
MCH: 27.7 pg (ref 26.0–34.0)
MCHC: 33.2 g/dL (ref 30.0–36.0)
MCV: 83.5 fL (ref 80.0–100.0)
NRBC: 0 % (ref 0.0–0.2)
Platelets: 145 10*3/uL — ABNORMAL LOW (ref 150–400)
RBC: 3.03 MIL/uL — AB (ref 4.22–5.81)
RDW: 13.1 % (ref 11.5–15.5)
WBC: 9.4 10*3/uL (ref 4.0–10.5)

## 2018-03-22 LAB — BASIC METABOLIC PANEL
ANION GAP: 6 (ref 5–15)
BUN: 39 mg/dL — ABNORMAL HIGH (ref 8–23)
CHLORIDE: 102 mmol/L (ref 98–111)
CO2: 26 mmol/L (ref 22–32)
Calcium: 8.4 mg/dL — ABNORMAL LOW (ref 8.9–10.3)
Creatinine, Ser: 2.38 mg/dL — ABNORMAL HIGH (ref 0.61–1.24)
GFR calc non Af Amer: 25 mL/min — ABNORMAL LOW (ref >60)
GFR, EST AFRICAN AMERICAN: 29 mL/min — AB (ref >60)
Glucose, Bld: 236 mg/dL — ABNORMAL HIGH (ref 70–99)
POTASSIUM: 5 mmol/L (ref 3.5–5.1)
Sodium: 134 mmol/L — ABNORMAL LOW (ref 135–145)

## 2018-03-22 LAB — CREATININE, SERUM
CREATININE: 2.66 mg/dL — AB (ref 0.61–1.24)
GFR calc non Af Amer: 22 mL/min — ABNORMAL LOW (ref >60)
GFR, EST AFRICAN AMERICAN: 25 mL/min — AB (ref >60)

## 2018-03-22 LAB — FERRITIN: FERRITIN: 120 ng/mL (ref 24–336)

## 2018-03-22 LAB — FOLATE: Folate: 7.7 ng/mL (ref >5.9)

## 2018-03-22 LAB — VITAMIN B12: Vitamin B-12: 172 pg/mL — ABNORMAL LOW (ref 180–914)

## 2018-03-22 MED ORDER — INSULIN GLARGINE 100 UNIT/ML ~~LOC~~ SOLN
50.0000 [IU] | Freq: Every day | SUBCUTANEOUS | Status: DC
  Administered 2018-03-23 – 2018-03-24 (×2): 50 [IU] via SUBCUTANEOUS
  Filled 2018-03-22 (×4): qty 0.5

## 2018-03-22 MED ORDER — SODIUM CHLORIDE 0.9 % IV BOLUS
1000.0000 mL | Freq: Once | INTRAVENOUS | Status: AC
  Administered 2018-03-22: 1000 mL via INTRAVENOUS

## 2018-03-22 MED ORDER — SODIUM CHLORIDE 0.9 % IV SOLN
INTRAVENOUS | Status: DC
  Administered 2018-03-22 – 2018-03-23 (×2): via INTRAVENOUS

## 2018-03-22 NOTE — Care Management (Signed)
There is concern that patient will need home health after all.  Contacted [facility] and spoke with Malachy Mood regarding physical therapy and possibly nursing

## 2018-03-22 NOTE — Progress Notes (Signed)
Lovenox teaching preformed with patient. Patient demonstrates correct administration of shot with teach back.

## 2018-03-22 NOTE — Progress Notes (Signed)
Physical Therapy Treatment Patient Details Name: Keith Howe MRN: [id number] DOB: 1940-06-14 Today's Date: 03/22/2018    History of Present Illness 77 y/o male status post L TKR (03/19/18), WBAT.    PT Comments    Pt still with some hesitancy and need for heavier cuing, but ultimately has made good gains and generally did better with mobility, gait, stair negotiation this date.  Pt eager to go home and from PT stand-point is good to to so.  Per yesterday's performance PT was still recommending HHPT, but he did well enough to day where outpatient PT is an appropriate option.  Follow Up Recommendations  Outpatient PT;Follow surgeon's recommendation for DC plan and follow-up therapies     Equipment Recommendations  Rolling walker with 5" wheels;3in1 (PT)    Recommendations for Other Services       Precautions / Restrictions Precautions Precautions: Fall Restrictions LLE Weight Bearing: Weight bearing as tolerated    Mobility  Bed Mobility Overal bed mobility: Modified Independent Bed Mobility: Sit to Supine     Supine to sit: Supervision     General bed mobility comments: Pt able to slowly, but w/o assist get to sitting at EOB  Transfers Overall transfer level: Needs assistance Equipment used: Rolling walker (2 wheeled) Transfers: Sit to/from Stand Sit to Stand: Supervision         General transfer comment: Pt still needing extra reminders for L knee positioning (getting up and down) as well as general awareness with walker, set up, etc but much safer and less impulsive than last PT visit  Ambulation/Gait Ambulation/Gait assistance: Min guard Gait Distance (Feet): 250 Feet Assistive device: Rolling walker (2 wheeled)       General Gait Details: Pt with more consistent cadence today with ability to maintain constant forward walker movement and though he still lacks comfortable TKE and displays poor foot clearance he generally did much better this  session.   Stairs Stairs: Yes Stairs assistance: Min guard Stair Management: Two rails;Forwards Number of Stairs: 4 General stair comments: Pt much better with steps this session, still some minimal impulsivity and need for extra cuing, but he was able to negotiate up/down with much more confidence today   Wheelchair Mobility    Modified Rankin (Stroke Patients Only)       Balance Overall balance assessment: Needs assistance Sitting-balance support: No upper extremity supported;Feet supported Sitting balance-Leahy Scale: Good       Standing balance-Leahy Scale: Fair                              Cognition Arousal/Alertness: Awake/alert Behavior During Therapy: WFL for tasks assessed/performed Overall Cognitive Status: Within Functional Limits for tasks assessed                                        Exercises Total Joint Exercises Ankle Circles/Pumps: Strengthening;15 reps Quad Sets: Strengthening;15 reps Gluteal Sets: Strengthening;15 reps Short Arc Quad: Strengthening;15 reps Heel Slides: Strengthening;10 reps Hip ABduction/ADduction: Strengthening;10 reps Straight Leg Raises: AROM;10 reps Knee Flexion: PROM;5 reps Goniometric ROM: 3-76    General Comments        Pertinent Vitals/Pain Pain Score: 5     Home Living                      Prior Function  PT Goals (current goals can now be found in the care plan section) Progress towards PT goals: Progressing toward goals    Frequency    BID      PT Plan Current plan remains appropriate    Co-evaluation              AM-PAC PT "6 Clicks" Daily Activity  Outcome Measure  Difficulty turning over in bed (including adjusting bedclothes, sheets and blankets)?: None Difficulty moving from lying on back to sitting on the side of the bed? : None Difficulty sitting down on and standing up from a chair with arms (e.g., wheelchair, bedside commode,  etc,.)?: A Little Help needed moving to and from a bed to chair (including a wheelchair)?: None Help needed walking in hospital room?: None Help needed climbing 3-5 steps with a railing? : A Little 6 Click Score: 22    End of Session Equipment Utilized During Treatment: Gait belt Activity Tolerance: Patient tolerated treatment well Patient left: with family/visitor present;with bed alarm set Nurse Communication: Mobility status PT Visit Diagnosis: Muscle weakness (generalized) (M62.81);Difficulty in walking, not elsewhere classified (R26.2);Pain Pain - Right/Left: Left Pain - part of body: Knee     Time: 3267-1245 PT Time Calculation (min) (ACUTE ONLY): 30 min  Charges:  $Gait Training: 8-22 mins $Therapeutic Exercise: 8-22 mins                     Kreg Shropshire, DPT 03/22/2018, 1:18 PM

## 2018-03-22 NOTE — Progress Notes (Signed)
Subjective:  POD #3   Patient reports left knee pain as mild.  PT says patient has issues with balance.  Patient with elevated creatinine.  Received fluid bolus per hospitalist.  Patient with blisters in left lower leg.  Objective:   VITALS:   Vitals:   03/21/18 2335 03/22/18 0744 03/22/18 1400 03/22/18 1656  BP: 122/76 128/62  113/67  Pulse: (!) 103 (!) 103  (!) 101  Resp: 19     Temp: 99.5 F (37.5 C) 98.3 F (36.8 C)  98.4 F (36.9 C)  TempSrc: Oral Oral  Oral  SpO2: 97% 100%  100%  Weight:   115.8 kg     PHYSICAL EXAM: Left lower extremity:  I personally changed the patient's dressing again today.   Large blister 6 x 3 cm anterior leg, inferior to incision.  Two small blisters lateral leg below level of incision.  Medial blister in line but not contiguous with inferior pole of incision.   Neurovascular intact Sensation intact distally Intact pulses distally Dorsiflexion/Plantar flexion intact Incision: scant drainage No cellulitis present Compartment soft  LABS  Results for orders placed or performed during the hospital encounter of 03/19/18 (from the past 24 hour(s))  Glucose, capillary     Status: Abnormal   Collection Time: 03/21/18  9:05 PM  Result Value Ref Range   Glucose-Capillary 226 (H) 70 - 99 mg/dL  CBC     Status: Abnormal   Collection Time: 03/22/18  4:20 AM  Result Value Ref Range   WBC 9.4 4.0 - 10.5 K/uL   RBC 3.03 (L) 4.22 - 5.81 MIL/uL   Hemoglobin 8.4 (L) 13.0 - 17.0 g/dL   HCT 25.3 (L) 39.0 - 52.0 %   MCV 83.5 80.0 - 100.0 fL   MCH 27.7 26.0 - 34.0 pg   MCHC 33.2 30.0 - 36.0 g/dL   RDW 13.1 11.5 - 15.5 %   Platelets 145 (L) 150 - 400 K/uL   nRBC 0.0 0.0 - 0.2 %  Basic metabolic panel     Status: Abnormal   Collection Time: 03/22/18  4:20 AM  Result Value Ref Range   Sodium 134 (L) 135 - 145 mmol/L   Potassium 5.0 3.5 - 5.1 mmol/L   Chloride 102 98 - 111 mmol/L   CO2 26 22 - 32 mmol/L   Glucose, Bld 236 (H) 70 - 99 mg/dL   BUN 39  (H) 8 - 23 mg/dL   Creatinine, Ser 2.38 (H) 0.61 - 1.24 mg/dL   Calcium 8.4 (L) 8.9 - 10.3 mg/dL   GFR calc non Af Amer 25 (L) >60 mL/min   GFR calc Af Amer 29 (L) >60 mL/min   Anion gap 6 5 - 15  Ferritin     Status: None   Collection Time: 03/22/18  4:20 AM  Result Value Ref Range   Ferritin 120 24 - 336 ng/mL  Iron and TIBC     Status: Abnormal   Collection Time: 03/22/18  4:20 AM  Result Value Ref Range   Iron 10 (L) 45 - 182 ug/dL   TIBC 231 (L) 250 - 450 ug/dL   Saturation Ratios 4 (L) 17.9 - 39.5 %   UIBC 221 ug/dL  Vitamin B12     Status: Abnormal   Collection Time: 03/22/18  4:20 AM  Result Value Ref Range   Vitamin B-12 172 (L) 180 - 914 pg/mL  Folate     Status: None   Collection Time: 03/22/18  4:20 AM  Result Value Ref Range   Folate 7.7 >5.9 ng/mL  Glucose, capillary     Status: Abnormal   Collection Time: 03/22/18  7:45 AM  Result Value Ref Range   Glucose-Capillary 217 (H) 70 - 99 mg/dL   Comment 1 Notify RN   Glucose, capillary     Status: Abnormal   Collection Time: 03/22/18 11:54 AM  Result Value Ref Range   Glucose-Capillary 244 (H) 70 - 99 mg/dL   Comment 1 Notify RN   Creatinine, serum     Status: Abnormal   Collection Time: 03/22/18  2:56 PM  Result Value Ref Range   Creatinine, Ser 2.66 (H) 0.61 - 1.24 mg/dL   GFR calc non Af Amer 22 (L) >60 mL/min   GFR calc Af Amer 25 (L) >60 mL/min  Occult blood card to lab, stool     Status: None   Collection Time: 03/22/18  4:30 PM  Result Value Ref Range   Fecal Occult Bld NEGATIVE NEGATIVE  Urinalysis, Complete w Microscopic     Status: Abnormal   Collection Time: 03/22/18  4:31 PM  Result Value Ref Range   Color, Urine YELLOW (A) YELLOW   APPearance HAZY (A) CLEAR   Specific Gravity, Urine 1.013 1.005 - 1.030   pH 5.0 5.0 - 8.0   Glucose, UA NEGATIVE NEGATIVE mg/dL   Hgb urine dipstick MODERATE (A) NEGATIVE   Bilirubin Urine NEGATIVE NEGATIVE   Ketones, ur NEGATIVE NEGATIVE mg/dL   Protein, ur 30  (A) NEGATIVE mg/dL   Nitrite NEGATIVE NEGATIVE   Leukocytes, UA TRACE (A) NEGATIVE   RBC / HPF 21-50 0 - 5 RBC/hpf   WBC, UA 11-20 0 - 5 WBC/hpf   Bacteria, UA RARE (A) NONE SEEN   Squamous Epithelial / LPF 0-5 0 - 5   Mucus PRESENT    Hyaline Casts, UA PRESENT   Glucose, capillary     Status: Abnormal   Collection Time: 03/22/18  4:58 PM  Result Value Ref Range   Glucose-Capillary 189 (H) 70 - 99 mg/dL   Comment 1 Notify RN     US Renal  Result Date: 03/22/2018 CLINICAL DATA:  Acute renal failure, history hypertension, diabetes mellitus EXAM: RENAL / URINARY TRACT ULTRASOUND COMPLETE COMPARISON:  None FINDINGS: Right Kidney: Renal measurements: 9.9 x 6.2 x 5.3 cm = volume: 170 mL. Age-related cortical thinning. Increased cortical echogenicity. No mass, hydronephrosis, or shadowing calcification. Left Kidney: Renal measurements: 9.4 x 6.4 x 5.7 cm = volume: 181 mL. Age-related renal cortical thinning. Increased cortical echogenicity. No mass, hydronephrosis or shadowing calcification. Bladder: Appears normal for degree of bladder distention. Incidentally noted prostatic enlargement with enlargement of the central lobe protruding into bladder base. Prostate gland measures 5.4 x 4.7 x 5.6 cm = 76 mL. IMPRESSION: Medical renal disease changes and age-related cortical atrophy of both kidneys. Prostatic enlargement. Electronically Signed   By: Lavonia Dana M.D.   On: 03/22/2018 10:26    Assessment/Plan: 3 Days Post-Op   Active Problems:   S/P TKR (total knee replacement) using cement, left  I am going to restart the patient's IV fluids overnight and recheck his creatinine in the morning.  Appreciate hospitalist following.  Patient has developed blisters in the left lower leg.  His compartments are soft and compressible.  Patient will continue with physical therapy and is being recommended for home health PT given his issues with balance during PT sessions here in the hospital.  Continue Lovenox  for DVT prophylaxis.  Patient understood and agree with this plan.  Possible discharge tomorrow if his creatinine improves.  Labs will be rechecked in the morning.    Thornton Park , MD 03/22/2018, 5:49 PM

## 2018-03-22 NOTE — Care Management Important Message (Signed)
Important Message  Patient Details  Name: Keith Howe MRN: [id number] Date of Birth: Sep 21, 1940   Medicare Important Message Given:  Yes    Juliann Pulse A Mariela Rex 03/22/2018, 11:32 AM

## 2018-03-22 NOTE — Progress Notes (Signed)
Physical Therapy Treatment Patient Details Name: Keith Howe MRN: [id number] DOB: 1940-10-08 Today's Date: 03/22/2018    History of Present Illness 77 y/o male status post L TKR (03/19/18), WBAT.    PT Comments    Pt continues to show good effort with PT session, but still struggling with TKE, slow/cautious gait with poor foot clearance and inconsistent/choppy cadence.  He continues to be able to negotiate up/down steps but needs heavy UE use, extra cuing and generally more difficulty than would be expected at this point.  Pt continues to be eager to work hard with PT and but does have some issues with poor follow through with cuing/training.    Follow Up Recommendations  Home health PT     Equipment Recommendations  Rolling walker with 5" wheels;3in1 (PT)    Recommendations for Other Services       Precautions / Restrictions Precautions Precautions: Fall Restrictions LLE Weight Bearing: Weight bearing as tolerated    Mobility  Bed Mobility Overal bed mobility: Modified Independent Bed Mobility: Sit to Supine       Sit to supine: Min guard   General bed mobility comments: Pt able to get LEs up and around in bed w/o assist  Transfers Overall transfer level: Needs assistance Equipment used: Rolling walker (2 wheeled) Transfers: Sit to/from Stand Sit to Stand: Supervision         General transfer comment: Pt again with some impulsivity with rising and sitting.  Needing direct cuing to insure that he is deliberate and aware of is positioning before/during/after transitions.   Ambulation/Gait Ambulation/Gait assistance: Min guard   Assistive device: Rolling walker (2 wheeled)       General Gait Details: Pt again with inability to attain TKE on L and clearly with some hesitancy with WBing.  He at times showed poor awareness with walker use and was either putting a lot of weight through it or at times leaning back and nearly loosing balance backward while  lifting wheels.  Pt with no LOBs but slow, choppy and generally needing a lot of work with constant cuing and reminders to be more deliberate with cadence. `   Stairs   Stairs assistance: Min guard Stair Management: One rail Left;Sideways   General stair comments: Pt continues to be somewhat impulsive with steps and showed poor foot clearance/confidence with weight shifting onto L.  Highly reliant on rails.   Wheelchair Mobility    Modified Rankin (Stroke Patients Only)       Balance Overall balance assessment: Needs assistance Sitting-balance support: No upper extremity supported;Feet supported Sitting balance-Leahy Scale: Good       Standing balance-Leahy Scale: Fair                              Cognition Arousal/Alertness: Awake/alert Behavior During Therapy: WFL for tasks assessed/performed Overall Cognitive Status: Within Functional Limits for tasks assessed                                        Exercises Total Joint Exercises Ankle Circles/Pumps: AROM;10 reps Quad Sets: Strengthening;15 reps Gluteal Sets: Strengthening;15 reps Short Arc Quad: Strengthening;15 reps Heel Slides: Strengthening;10 reps Hip ABduction/ADduction: Strengthening;10 reps Straight Leg Raises: AROM;10 reps Knee Flexion: PROM;5 reps    General Comments        Pertinent Vitals/Pain  Home Living                      Prior Function            PT Goals (current goals can now be found in the care plan section)      Frequency    BID      PT Plan Current plan remains appropriate    Co-evaluation              AM-PAC PT "6 Clicks" Daily Activity  Outcome Measure  Difficulty turning over in bed (including adjusting bedclothes, sheets and blankets)?: None Difficulty moving from lying on back to sitting on the side of the bed? : None Difficulty sitting down on and standing up from a chair with arms (e.g., wheelchair, bedside  commode, etc,.)?: A Little Help needed moving to and from a bed to chair (including a wheelchair)?: A Little Help needed walking in hospital room?: A Little Help needed climbing 3-5 steps with a railing? : A Little 6 Click Score: 20    End of Session Equipment Utilized During Treatment: Gait belt Activity Tolerance: Patient tolerated treatment well Patient left: with family/visitor present;with bed alarm set Nurse Communication: Mobility status PT Visit Diagnosis: Muscle weakness (generalized) (M62.81);Difficulty in walking, not elsewhere classified (R26.2);Pain Pain - Right/Left: Left Pain - part of body: Knee     Time:  1528- 1558    Charges:                        Kreg Shropshire, DPT 03/22/2018, 7:46 AM

## 2018-03-22 NOTE — Care Management (Signed)
CM spoke with patient's and wife regarding discharge disposition.  Wife states that the orthopedist told her that "he wants patient to go to outaptient therapy on Monday Nove 18.  Physical therapy was still recommending home health after this morning's therapy but revised it after this afternoon session to outpatient which pleases wife.  Lovenox called to [facility] in [place] and out of pocket cost will be 3.40.  Gilford Rile has been delivered to room but bedside commode has not.  Notified [facility] who will deliver one to the room today.  Wife updated.

## 2018-03-22 NOTE — Progress Notes (Signed)
[name]    MR#:  [id number]  DATE OF BIRTH:  [birth year]  SUBJECTIVE:   Doing well this morning.  Worked with PT without any issues.  States he is ready to leave the hospital.  REVIEW OF SYSTEMS:  Review of Systems  Constitutional: Negative for chills, diaphoresis and fever.  HENT: Negative for congestion and sore throat.   Eyes: Negative for blurred vision and double vision.  Respiratory: Negative for cough and shortness of breath.   Cardiovascular: Negative for chest pain, palpitations, orthopnea and leg swelling.  Gastrointestinal: Negative for abdominal pain, nausea and vomiting.  Genitourinary: Negative for dysuria, frequency and urgency.  Musculoskeletal: Negative for back pain and myalgias.  Neurological: Negative for dizziness and headaches.  Psychiatric/Behavioral: Negative for depression.    DRUG ALLERGIES:  No Known Allergies VITALS:  Blood pressure 128/62, pulse (!) 103, temperature 98.3 F (36.8 C), temperature source Oral, resp. rate 19, SpO2 100 %. PHYSICAL EXAMINATION:  Physical Exam  GENERAL:  77 y.o.-year-old obese patient sitting up in chair with no acute distress. EYES: Pupils equal, round, reactive to light and accommodation. No scleral icterus. Extraocular muscles intact.  HEENT: Head atraumatic, normocephalic. Oropharynx and nasopharynx clear.  Moist mucous membranes. NECK:  Supple, no jugular venous distention. No thyroid enlargement, no tenderness.  LUNGS: Normal breath sounds bilaterally, no wheezing, rales,rhonchi or crepitation. No use of accessory muscles of respiration.  CARDIOVASCULAR: RRR, S1, S2 normal. No murmurs, rubs, or gallops.  ABDOMEN: Soft, nontender, nondistended. Bowel sounds present. No organomegaly or mass.  EXTREMITIES: No pedal edema, cyanosis, or clubbing.   +Left leg immobilizer in place NEUROLOGIC: Cranial nerves II through XII are intact.  No focal  weakness, sensation intact. Gait not checked.  PSYCHIATRIC: The patient is alert and oriented x 3.  SKIN: No obvious rash, lesion, or ulcer.  LABORATORY PANEL:  Male CBC Recent Labs  Lab 03/22/18 0420  WBC 9.4  HGB 8.4*  HCT 25.3*  PLT 145*   ------------------------------------------------------------------------------------------------------------------ Chemistries  Recent Labs  Lab 03/22/18 0420  NA 134*  K 5.0  CL 102  CO2 26  GLUCOSE 236*  BUN 39*  CREATININE 2.38*  CALCIUM 8.4*   RADIOLOGY:  US Renal  Result Date: 03/22/2018 CLINICAL DATA:  Acute renal failure, history hypertension, diabetes mellitus EXAM: RENAL / URINARY TRACT ULTRASOUND COMPLETE COMPARISON:  None FINDINGS: Right Kidney: Renal measurements: 9.9 x 6.2 x 5.3 cm = volume: 170 mL. Age-related cortical thinning. Increased cortical echogenicity. No mass, hydronephrosis, or shadowing calcification. Left Kidney: Renal measurements: 9.4 x 6.4 x 5.7 cm = volume: 181 mL. Age-related renal cortical thinning. Increased cortical echogenicity. No mass, hydronephrosis or shadowing calcification. Bladder: Appears normal for degree of bladder distention. Incidentally noted prostatic enlargement with enlargement of the central lobe protruding into bladder base. Prostate gland measures 5.4 x 4.7 x 5.6 cm = 76 mL. IMPRESSION: Medical renal disease changes and age-related cortical atrophy of both kidneys. Prostatic enlargement. Electronically Signed   By: Lavonia Dana M.D.   On: 03/22/2018 10:26   ASSESSMENT AND PLAN:   AKI in CKD III- Cr increased from 1.57 > 2.38 this morning. Likely due to dehydration, as patient states his urine has been dark yellow. No symptoms of urinary obstruction. -Renal US unremarkable -Will give a 1L NS bolus -Encouraged po intake -Continue holding lisinopril and HCTZ -Recheck Cr at 1500  Acute blood loss anemia- Cr has been trending down since  surgery. Hgb 12.3 on admission and 8.4  today. -Check anemia panel and FOBT -Needs CBC rechecked as an outpatient  Hypertension- BPs normal today -Continue metoprolol -Holding lisinopril and HCTZ -Monitor BPs closely  Type 2 diabetes- blood sugars continue to be high -Increase Lantus to 50 units daily -Continue moderate SSI  Osteoarthritis with s/p left total knee replacement on 11/12. -Management per orthopedics. -Pain control -PT recommending outpatient PT  BPH- stable -Continue home medications  DVT prophylaxis- Lovenox    All the records are reviewed and case discussed with Care Management/Social Worker. Management plans discussed with the patient, family and they are in agreement.  CODE STATUS: Full Code  TOTAL TIME TAKING CARE OF THIS PATIENT: 35 minutes.   More than 50% of the time was spent in counseling/coordination of care: YES  POSSIBLE D/C today or tomorrow, DEPENDING ON CLINICAL CONDITION.   Berna Spare Deneisha Dade M.D on 03/22/2018 at 1:49 PM  Between 7am to 6pm - Pager - [phone]  After 6pm go to [email or web address] - Proofreader  [facility]  Office  [phone]  CC: Primary care physician; Cletis Athens, MD  Note: This dictation was prepared with Dragon dictation along with smaller phrase technology. Any transcriptional errors that result from this process are unintentional.

## 2018-03-22 NOTE — Progress Notes (Signed)
Physical Therapy Treatment Patient Details Name: Keith Howe MRN: [id number] DOB: 1940/08/24 Today's Date: 03/22/2018    History of Present Illness 77 y/o male status post L TKR (03/19/18), WBAT.    PT Comments    Pt is laying in bed having expected to go home but d/c held secondary to some lab work and other medical issues.  He was willing to participate with PT session focusing on knee ROM and quad strength.  Pt's knee continues to be considerably tight and he has a lot of pain just getting up to ~70 degrees of flexion.  Pt still lacking great quality quad set and overall continues to have some issues with follow trough on cuing (putting L LE in front while sitting, showing poor awareness with walker use and cadence, etc).  Gentle overpressure for flexion and extension of L knee this afternoon with continued guarding by pt.  He shows good effort with all tasks but is struggling with ROM and generally with mobility/gait independence.   Follow Up Recommendations  Follow surgeon's recommendation for DC plan and follow-up therapies     Equipment Recommendations  Rolling walker with 5" wheels;3in1 (PT)    Recommendations for Other Services       Precautions / Restrictions Precautions Precautions: Fall Restrictions LLE Weight Bearing: Weight bearing as tolerated    Mobility  Bed Mobility               General bed mobility comments: deferred mobility this afternoon  Transfers                    Ambulation/Gait                 Stairs             Wheelchair Mobility    Modified Rankin (Stroke Patients Only)       Balance                                            Cognition Arousal/Alertness: Awake/alert Behavior During Therapy: WFL for tasks assessed/performed Overall Cognitive Status: Within Functional Limits for tasks assessed                                        Exercises Total Joint  Exercises Ankle Circles/Pumps: Strengthening;15 reps Quad Sets: Strengthening;15 reps Short Arc Quad: Strengthening;15 reps(3 second holds) Heel Slides: Strengthening;5 reps Hip ABduction/ADduction: Strengthening;10 reps Straight Leg Raises: AROM;10 reps Knee Flexion: PROM;5 reps    General Comments        Pertinent Vitals/Pain Pain Score: 7 (continues to have significant pain with all ROM tasks)    Home Living                      Prior Function            PT Goals (current goals can now be found in the care plan section) Progress towards PT goals: Progressing toward goals    Frequency    BID      PT Plan Current plan remains appropriate    Co-evaluation              AM-PAC PT "6 Clicks" Daily Activity  Outcome Measure  Difficulty turning over in bed (including adjusting  bedclothes, sheets and blankets)?: None Difficulty moving from lying on back to sitting on the side of the bed? : None Difficulty sitting down on and standing up from a chair with arms (e.g., wheelchair, bedside commode, etc,.)?: A Little Help needed moving to and from a bed to chair (including a wheelchair)?: None Help needed walking in hospital room?: None Help needed climbing 3-5 steps with a railing? : A Little 6 Click Score: 22    End of Session Equipment Utilized During Treatment: Gait belt Activity Tolerance: Patient tolerated treatment well Patient left: with family/visitor present;with bed alarm set Nurse Communication: Mobility status PT Visit Diagnosis: Muscle weakness (generalized) (M62.81);Difficulty in walking, not elsewhere classified (R26.2);Pain Pain - Right/Left: Left Pain - part of body: Knee     Time: 4320-0379 PT Time Calculation (min) (ACUTE ONLY): 16 min  Charges:  $Therapeutic Exercise: 8-22 mins                     Kreg Shropshire, DPT 03/22/2018, 5:14 PM

## 2018-03-23 LAB — CBC
HCT: 23.4 % — ABNORMAL LOW (ref 39.0–52.0)
HEMOGLOBIN: 7.8 g/dL — AB (ref 13.0–17.0)
MCH: 27.9 pg (ref 26.0–34.0)
MCHC: 33.3 g/dL (ref 30.0–36.0)
MCV: 83.6 fL (ref 80.0–100.0)
NRBC: 0 % (ref 0.0–0.2)
PLATELETS: 158 10*3/uL (ref 150–400)
RBC: 2.8 MIL/uL — AB (ref 4.22–5.81)
RDW: 12.6 % (ref 11.5–15.5)
WBC: 10.7 10*3/uL — ABNORMAL HIGH (ref 4.0–10.5)

## 2018-03-23 LAB — BASIC METABOLIC PANEL
ANION GAP: 7 (ref 5–15)
BUN: 58 mg/dL — ABNORMAL HIGH (ref 8–23)
CO2: 26 mmol/L (ref 22–32)
Calcium: 7.9 mg/dL — ABNORMAL LOW (ref 8.9–10.3)
Chloride: 99 mmol/L (ref 98–111)
Creatinine, Ser: 2.44 mg/dL — ABNORMAL HIGH (ref 0.61–1.24)
GFR, EST AFRICAN AMERICAN: 28 mL/min — AB (ref >60)
GFR, EST NON AFRICAN AMERICAN: 24 mL/min — AB (ref >60)
Glucose, Bld: 161 mg/dL — ABNORMAL HIGH (ref 70–99)
POTASSIUM: 4.8 mmol/L (ref 3.5–5.1)
Sodium: 132 mmol/L — ABNORMAL LOW (ref 135–145)

## 2018-03-23 LAB — GLUCOSE, CAPILLARY
Glucose-Capillary: 183 mg/dL — ABNORMAL HIGH (ref 70–99)
Glucose-Capillary: 185 mg/dL — ABNORMAL HIGH (ref 70–99)
Glucose-Capillary: 209 mg/dL — ABNORMAL HIGH (ref 70–99)

## 2018-03-23 LAB — PREPARE RBC (CROSSMATCH)

## 2018-03-23 MED ORDER — FERROUS SULFATE 325 (65 FE) MG PO TABS
325.0000 mg | ORAL_TABLET | Freq: Two times a day (BID) | ORAL | Status: DC
  Administered 2018-03-24: 325 mg via ORAL
  Filled 2018-03-23: qty 1

## 2018-03-23 MED ORDER — SODIUM CHLORIDE 0.9 % IV SOLN
100.0000 mg | Freq: Once | INTRAVENOUS | Status: AC
  Administered 2018-03-23: 100 mg via INTRAVENOUS
  Filled 2018-03-23: qty 5

## 2018-03-23 MED ORDER — SODIUM CHLORIDE 0.9% IV SOLUTION
Freq: Once | INTRAVENOUS | Status: AC
  Administered 2018-03-23: 18:00:00 via INTRAVENOUS

## 2018-03-23 MED ORDER — DIPHENHYDRAMINE HCL 25 MG PO CAPS
25.0000 mg | ORAL_CAPSULE | Freq: Once | ORAL | Status: AC
  Administered 2018-03-23: 25 mg via ORAL
  Filled 2018-03-23: qty 1

## 2018-03-23 MED ORDER — ACETAMINOPHEN 325 MG PO TABS
650.0000 mg | ORAL_TABLET | Freq: Once | ORAL | Status: AC
  Administered 2018-03-23: 650 mg via ORAL
  Filled 2018-03-23: qty 2

## 2018-03-23 NOTE — Progress Notes (Signed)
Physical Therapy Treatment Patient Details Name: Keith Howe MRN: [id number] DOB: [birth year] Today's Date: 03/23/2018    History of Present Illness admitted for acute hospitalization status post L TKR (03/19/18), WBAT.    PT Comments    Continues with limited L knee flexion, limited functional use of L LE with movement transitions.  Improved positioning/management of RW, but continues to require cga/close sup for all mobility efforts.  Less impulsive, improved safety and insight this PM.  HR does elevate to 130s with exertion, but recovers with seated rest.   Follow Up Recommendations  Follow surgeon's recommendation for DC plan and follow-up therapies     Equipment Recommendations  Rolling walker with 5" wheels;3in1 (PT)    Recommendations for Other Services       Precautions / Restrictions Precautions Precautions: Fall Precaution Comments: Watch HR Restrictions Weight Bearing Restrictions: Yes LLE Weight Bearing: Weight bearing as tolerated    Mobility  Bed Mobility               General bed mobility comments: seated in recliner beginning/end of treatment session  Transfers Overall transfer level: Needs assistance Equipment used: Rolling walker (2 wheeled) Transfers: Sit to/from Stand Sit to Stand: Min guard;Min assist         General transfer comment: consistent verbal cuing for hand placement, anterior weight translation; limited active use and functioanl ROM to L LE  Ambulation/Gait Ambulation/Gait assistance: Min guard Gait Distance (Feet): 200 Feet Assistive device: Rolling walker (2 wheeled)       General Gait Details: step to progressing to partial step through gait pattern; maintains forward flexed posture, L knee flexion throughout gait cycle.  Improevd walker position and management this PM   Stairs             Wheelchair Mobility    Modified Rankin (Stroke Patients Only)       Balance Overall balance assessment: Needs  assistance Sitting-balance support: No upper extremity supported;Feet supported Sitting balance-Leahy Scale: Good     Standing balance support: Bilateral upper extremity supported Standing balance-Leahy Scale: Fair Standing balance comment: Pt needed BUE on RW at all times; unable to take UE off RW to pull up underwear.                             Cognition Arousal/Alertness: Awake/alert Behavior During Therapy: WFL for tasks assessed/performed Overall Cognitive Status: Within Functional Limits for tasks assessed                                        Exercises Other Exercises Other Exercises: Sit/stand x5 with RW, cga/min assist-consistent cuing for movement mechanics and overall safety.  Encouraged to progressively utilize L LE as able Other Exercises: L knee flexion stretching, 3x60 seconds, for increased ROM    General Comments General comments (skin integrity, edema, etc.): Pt sitting in recliner; BP 134/65, O2- 96%, HR 100; after sit to stand x2 BP 146/63, O2 98%, HR 111.      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 : None Difficulty sitting down on and standing up from a chair with arms (e.g., wheelchair, bedside commode, etc,.)?: A Little Help needed moving to and from a bed to chair (including a wheelchair)?: A Little Help needed walking in hospital room?: A Little Help needed climbing 3-5 steps with a railing? : A Little 6 Click Score: 20    End of Session Equipment Utilized During Treatment: Gait belt Activity Tolerance: Patient tolerated treatment well Patient left: in chair;with call bell/phone within reach;with chair alarm set;with family/visitor present Nurse Communication: Mobility status PT Visit Diagnosis: Muscle weakness (generalized) (M62.81);Difficulty in walking, not elsewhere classified (R26.2);Pain Pain - Right/Left: Left Pain - part of body: Knee     Time: 8563-1497 PT Time Calculation (min) (ACUTE ONLY): 27 min  Charges:  $Gait Training: 8-22 mins $Therapeutic Exercise: 8-22 mins                     Abiageal Blowe H. Owens Shark, PT, DPT, NCS 03/23/18, 4:20 PM [phone]

## 2018-03-23 NOTE — Progress Notes (Signed)
Physical Therapy Treatment Patient Details Name: Keith Howe MRN: [id number] DOB: 11/08/1940 Today's Date: 03/23/2018    History of Present Illness 77 y/o male status post L TKR (03/19/18), WBAT.    PT Comments    Pt in bed, ready for session. Participated in exercises as described below.  Pt stood with min guard x 1.  Pt generally unsteady with some shakiness noted in BUE's while static standing.  He is slightly sweaty but stated he felt ok.  Room was quite warm as temp was set to almost 80.  Temperature lowered and a second assist was called as he wanted to walk.  He was able to complete a lap around unit but he presented with irregular gait pattern and overall decreased safety with walker.  He required cues to step up inside walker and to move away from wall when walker was hitting it.  Gait quality decreased as he fatigued.  Upon return he sat quickly in chair due to fatigue but stated he could have walked the lap again.  He demonstrated poor insight into his fatigue and overall gait safety.  Vitals were taken at this time.  BP 124/53 O2 sats on room air and HR 131.  After 5 minutes rest, HR decreased to 117.  Discussed with primary RN regarding vitals and general safety concerns.    She voiced similar concerns with mobility.   Follow Up Recommendations  Follow surgeon's recommendation for DC plan and follow-up therapies     Equipment Recommendations  Rolling walker with 5" wheels;3in1 (PT)    Recommendations for Other Services       Precautions / Restrictions Precautions Precautions: Fall Precaution Comments: Watch HR Restrictions Weight Bearing Restrictions: Yes LLE Weight Bearing: Weight bearing as tolerated    Mobility  Bed Mobility Overal bed mobility: Modified Independent                Transfers Overall transfer level: Needs assistance Equipment used: Rolling walker (2 wheeled) Transfers: Sit to/from Stand Sit to Stand: Min guard             Ambulation/Gait   Gait Distance (Feet): 200 Feet Assistive device: Rolling walker (2 wheeled) Gait Pattern/deviations: Step-through pattern;Decreased step length - right;Decreased step length - left;Decreased stance time - left Gait velocity: decreased   General Gait Details: Poor foot clearance, uneven step legth and variable speed.  Poor walker position at times.   Stairs             Wheelchair Mobility    Modified Rankin (Stroke Patients Only)       Balance Overall balance assessment: Needs assistance Sitting-balance support: No upper extremity supported;Feet supported Sitting balance-Leahy Scale: Good     Standing balance support: Bilateral upper extremity supported;Single extremity supported Standing balance-Leahy Scale: Fair                              Cognition Arousal/Alertness: Awake/alert Behavior During Therapy: WFL for tasks assessed/performed Overall Cognitive Status: Within Functional Limits for tasks assessed                                        Exercises Total Joint Exercises Ankle Circles/Pumps: Strengthening;15 reps Quad Sets: Strengthening;15 reps Heel Slides: Strengthening;10 reps;AAROM Straight Leg Raises: AROM;Strengthening;10 reps Long Arc Quad: AROM;Strengthening;10 reps Knee Flexion: AAROM;10 reps;Seated Goniometric ROM: 2-80  General Comments        Pertinent Vitals/Pain Pain Assessment: No/denies pain    Home Living                      Prior Function            PT Goals (current goals can now be found in the care plan section) Progress towards PT goals: Progressing toward goals    Frequency    BID      PT Plan Current plan remains appropriate    Co-evaluation              AM-PAC PT "6 Clicks" Daily Activity  Outcome Measure  Difficulty turning over in bed (including adjusting bedclothes, sheets and blankets)?: None Difficulty moving from lying on back to  sitting on the side of the bed? : None Difficulty sitting down on and standing up from a chair with arms (e.g., wheelchair, bedside commode, etc,.)?: A Little Help needed moving to and from a bed to chair (including a wheelchair)?: A Little Help needed walking in hospital room?: A Little Help needed climbing 3-5 steps with a railing? : A Little 6 Click Score: 20    End of Session Equipment Utilized During Treatment: Gait belt Activity Tolerance: Patient limited by fatigue;Treatment limited secondary to medical complications (Comment) Patient left: in chair;with call bell/phone within reach;with chair alarm set Nurse Communication: Mobility status;Other (comment) Pain - Right/Left: Left Pain - part of body: Knee     Time: 0852-0922 PT Time Calculation (min) (ACUTE ONLY): 30 min  Charges:  $Gait Training: 8-22 mins $Therapeutic Exercise: 8-22 mins                     Chesley Noon, PTA 03/23/18, 9:34 AM

## 2018-03-23 NOTE — Progress Notes (Signed)
Occupational Therapy Treatment Patient Details Name: Keith Howe MRN: [id number] DOB: 08-29-1940 Today's Date: 03/23/2018    History of present illness 77 y/o male status post L TKR (03/19/18), WBAT.   OT comments  Pt seen for OT treatment this session. Pt admitted for L TKR, WBAT. Pt presented in recliner with spouse present in room. Pt attempted to don/dof underwear from sit to stand. Pt able to use AE  with MOD I for manipulation of clothing up to thigh high. Pt initially stood with MOD A and RW for completion of task but needed to sit back down 2/2 pain in LLE.  Upon second attempt, pt stood with MOD A but was unable to lift single UE off RW to complete dressing over hips and needed to sit back down. Pt balance impaired requiring strong assist from UE on RW for standing. Seated BP 134/65, O2- 96%, HR 100; after sit to stand x2 BP 146/63, O2 98%, HR 111.   Follow Up Recommendations  Home health OT    Equipment Recommendations       Recommendations for Other Services      Precautions / Restrictions Precautions Precautions: Fall Precaution Comments: Watch HR Restrictions Weight Bearing Restrictions: Yes LLE Weight Bearing: Weight bearing as tolerated       Mobility Bed Mobility                  Transfers                      Balance Overall balance assessment: Needs assistance Sitting-balance support: No upper extremity supported;Feet supported Sitting balance-Leahy Scale: Good     Standing balance support: Bilateral upper extremity supported;During functional activity Standing balance-Leahy Scale: Poor Standing balance comment: Pt needed BUE on RW at all times; unable to take UE off RW to pull up underwear.                            ADL either performed or assessed with clinical judgement   ADL                       Lower Body Dressing: Moderate assistance;Sit to/from stand Lower Body Dressing Details (indicate cue type  and reason): Pt attempted to Don/dof underwear from sit to stand. Pt able to use AE with MOD I for manipulation of clothing up to thigh high. Pt initially stood with Mod A and RW but needed to sit back down 2/2 pain in LLE.  Upon second attempt, pt stood with MOD A but was unable to finish task.                      Vision       Perception     Praxis      Cognition                                                Exercises     Shoulder Instructions       General Comments Pt sitting in recliner; BP 134/65, O2- 96%, HR 100; after sit to stand x2 BP 146/63, O2 98%, HR 111.    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 OT Goal Formulation: With patient Time For Goal Achievement: 03/23/18 Potential to Achieve Goals: Good  Plan Discharge plan remains appropriate    Co-evaluation                 AM-PAC PT "6 Clicks" Daily Activity     Outcome Measure   Help from another person eating meals?: None Help from another person taking care of personal grooming?: None Help from another person toileting, which includes using toliet, bedpan, or urinal?: A Little Help from another person bathing (including washing, rinsing, drying)?: A Lot Help from another person to put on and taking off regular upper body clothing?: A Little Help from another person to put on and taking off regular lower body clothing?: A Lot 6 Click Score: 18    End of Session Equipment Utilized During Treatment: Gait belt;Rolling walker  OT Visit Diagnosis: Other abnormalities of gait and mobility (R26.89) Pain -  Right/Left: Left Pain - part of body: Knee   Activity Tolerance Patient limited by pain   Patient Left in chair;with call bell/phone within reach;with chair alarm set;with family/visitor present   Nurse Communication          Time: 9480-1655 OT Time Calculation (min): 27 min  Charges:     Jadene Pierini OTS    03/23/2018, 2:51 PM

## 2018-03-23 NOTE — Progress Notes (Signed)
[facility] NAME: Thompson Mckim    MR#:  [id number]  DATE OF BIRTH:  1941/03/23  SUBJECTIVE:  Out of bed to chair with no complaints.  Tolerating diet not seen by nephrologist in the past.  Wife at bedside  Worked with PT without any issues.    REVIEW OF SYSTEMS:  Review of Systems  Constitutional: Negative for chills, diaphoresis and fever.  HENT: Negative for congestion and sore throat.   Eyes: Negative for blurred vision and double vision.  Respiratory: Negative for cough and shortness of breath.   Cardiovascular: Negative for chest pain, palpitations, orthopnea and leg swelling.  Gastrointestinal: Negative for abdominal pain, nausea and vomiting.  Genitourinary: Negative for dysuria, frequency and urgency.  Musculoskeletal: Negative for back pain and myalgias.  Neurological: Negative for dizziness and headaches.  Psychiatric/Behavioral: Negative for depression.    DRUG ALLERGIES:  No Known Allergies VITALS:  Blood pressure (!) 124/53, pulse (!) 131, temperature 99.1 F (37.3 C), temperature source Oral, resp. rate 16, weight 115.8 kg, SpO2 99 %. PHYSICAL EXAMINATION:  Physical Exam  GENERAL:  77 y.o.-year-old obese patient sitting up in chair with no acute distress. EYES: Pupils equal, round, reactive to light and accommodation. No scleral icterus. Extraocular muscles intact.  HEENT: Head atraumatic, normocephalic. Oropharynx and nasopharynx clear.  Moist mucous membranes. NECK:  Supple, no jugular venous distention. No thyroid enlargement, no tenderness.  LUNGS: Normal breath sounds bilaterally, no wheezing, rales,rhonchi or crepitation. No use of accessory muscles of respiration.  CARDIOVASCULAR: RRR, S1, S2 normal. No murmurs, rubs, or gallops.  ABDOMEN: Soft, nontender, nondistended. Bowel sounds present. No organomegaly or mass.  EXTREMITIES: No pedal edema, cyanosis, or clubbing.   +Left leg immobilizer in  place NEUROLOGIC: Cranial nerves II through XII are intact.  No focal weakness, sensation intact. Gait not checked.  PSYCHIATRIC: The patient is alert and oriented x 3.  SKIN: No obvious rash, lesion, or ulcer.  LABORATORY PANEL:  Male CBC Recent Labs  Lab 03/23/18 0450  WBC 10.7*  HGB 7.8*  HCT 23.4*  PLT 158   ------------------------------------------------------------------------------------------------------------------ Chemistries  Recent Labs  Lab 03/23/18 0450  NA 132*  K 4.8  CL 99  CO2 26  GLUCOSE 161*  BUN 58*  CREATININE 2.44*  CALCIUM 7.9*   RADIOLOGY:  No results found. ASSESSMENT AND PLAN:   AKI in CKD III- Cr baseline seems to be at 1.57-likely prerenal from dehydration  2.38-2.66-2.44 this morning. No symptoms of urinary obstruction. -Renal US unremarkable Patient has received fluid boluses we will continue IV fluids and monitor renal function closely -Encouraged to increase oral fluid intake  -Continue holding lisinopril and HCTZ -Recheck BMP in a.m.  Acute blood loss anemia-normocytic with a component of hemodilution from IV fluids Cr has been trending down since surgery.  Hgb 12.3 on admission and 8.4--7.8 today. -Negative FOBT -Folate is normal B12 is below normal Iron at 10 with TIBC 231.  IV iron and iron supplements -Needs CBC rechecked as an outpatient -Outpatient GI and hematology follow-up if no improvement clinically  Hypertension- BPs normal today -Continue metoprolol -Holding lisinopril and HCTZ -Monitor BPs closely  Type 2 diabetes- blood sugars continue to be high -Increase Lantus to 50 units daily -Continue moderate SSI  Osteoarthritis with s/p left total knee replacement on 11/12. -Management per orthopedics. -Pain control -PT recommending outpatient PT, rolling walker with 5 inch wheels  BPH- stable -Continue home medications  DVT prophylaxis- Lovenox  All the records are reviewed and case discussed with  Care Management/Social Worker. Management plans discussed with the patient, wife at bedside and they are in agreement.  CODE STATUS: Full Code  TOTAL TIME TAKING CARE OF THIS PATIENT: 35 minutes.   More than 50% of the time was spent in counseling/coordination of care: YES  POSSIBLE D/C  tomorrow, DEPENDING ON CLINICAL CONDITION.   Nicholes Mango M.D on 03/23/2018 at 12:46 PM  Between 7am to 6pm - Pager - [phone]  After 6pm go to [email or web address] - Proofreader  [facility]  Office  [phone]  CC: Primary care physician; Cletis Athens, MD  Note: This dictation was prepared with Dragon dictation along with smaller phrase technology. Any transcriptional errors that result from this process are unintentional.

## 2018-03-23 NOTE — Progress Notes (Signed)
Subjective:  POD #4 s/p left TKA.  Patient reports left knee pain as mild.  Patient's creatinine is 2.44 down from 2.66 yesterday.  Patient's preoperative creatinine was 1.71.  Patient has proteinuria on UA performed on 03/22/2018.  Patient is been supported with IV fluid.  A renal ultrasound was performed yesterday.  Showed age-related cortical ring but no mass hydronephrosis or shadowing calcification.  Was incidentally noted to have prostatic enlargement.  Patient's wife is at the bedside.  Patient is sitting up in a chair.  He states he was told to perform with physical therapy today.  He has had a bowel movement.  Patient's hemoglobin is 7.8 today.  She has developed large blisters over the left leg secondary to postoperative swelling.  Objective:   VITALS:   Vitals:   03/23/18 0047 03/23/18 0810 03/23/18 0915 03/23/18 0925  BP: 121/67 (!) 103/59 (!) 124/53   Pulse: 97 100 (!) 131   Resp: 16     Temp: 99.7 F (37.6 C) 99.1 F (37.3 C)    TempSrc: Oral Oral    SpO2: 99% 96%  99%  Weight:        PHYSICAL EXAM: Left lower extremity: I prepped the patient's blisters with Betadine.  Using an 11 blade I drained the blisters.  I applied bacitracin, Adaptic, gauze and Tegaderm over the blisters.  I change the patient's honeycomb dressing.  There is minimal drainage on the dressing.  Patient's compartments are soft and compressible.  He has palpable pedal pulses, intact sensation light touch and intact motor function without weakness. Neurovascular intact Sensation intact distally Intact pulses distally Dorsiflexion/Plantar flexion intact Incision: scant drainage No cellulitis present Compartment soft  LABS  Results for orders placed or performed during the hospital encounter of 03/19/18 (from the past 24 hour(s))  Creatinine, serum     Status: Abnormal   Collection Time: 03/22/18  2:56 PM  Result Value Ref Range   Creatinine, Ser 2.66 (H) 0.61 - 1.24 mg/dL   GFR calc non Af Amer  22 (L) >60 mL/min   GFR calc Af Amer 25 (L) >60 mL/min  Occult blood card to lab, stool     Status: None   Collection Time: 03/22/18  4:30 PM  Result Value Ref Range   Fecal Occult Bld NEGATIVE NEGATIVE  Urinalysis, Complete w Microscopic     Status: Abnormal   Collection Time: 03/22/18  4:31 PM  Result Value Ref Range   Color, Urine YELLOW (A) YELLOW   APPearance HAZY (A) CLEAR   Specific Gravity, Urine 1.013 1.005 - 1.030   pH 5.0 5.0 - 8.0   Glucose, UA NEGATIVE NEGATIVE mg/dL   Hgb urine dipstick MODERATE (A) NEGATIVE   Bilirubin Urine NEGATIVE NEGATIVE   Ketones, ur NEGATIVE NEGATIVE mg/dL   Protein, ur 30 (A) NEGATIVE mg/dL   Nitrite NEGATIVE NEGATIVE   Leukocytes, UA TRACE (A) NEGATIVE   RBC / HPF 21-50 0 - 5 RBC/hpf   WBC, UA 11-20 0 - 5 WBC/hpf   Bacteria, UA RARE (A) NONE SEEN   Squamous Epithelial / LPF 0-5 0 - 5   Mucus PRESENT    Hyaline Casts, UA PRESENT   Glucose, capillary     Status: Abnormal   Collection Time: 03/22/18  4:58 PM  Result Value Ref Range   Glucose-Capillary 189 (H) 70 - 99 mg/dL   Comment 1 Notify RN   Glucose, capillary     Status: Abnormal   Collection Time: 03/22/18  9:29  PM  Result Value Ref Range   Glucose-Capillary 139 (H) 70 - 99 mg/dL   Comment 1 Notify RN   CBC     Status: Abnormal   Collection Time: 03/23/18  4:50 AM  Result Value Ref Range   WBC 10.7 (H) 4.0 - 10.5 K/uL   RBC 2.80 (L) 4.22 - 5.81 MIL/uL   Hemoglobin 7.8 (L) 13.0 - 17.0 g/dL   HCT 23.4 (L) 39.0 - 52.0 %   MCV 83.6 80.0 - 100.0 fL   MCH 27.9 26.0 - 34.0 pg   MCHC 33.3 30.0 - 36.0 g/dL   RDW 12.6 11.5 - 15.5 %   Platelets 158 150 - 400 K/uL   nRBC 0.0 0.0 - 0.2 %  Basic metabolic panel     Status: Abnormal   Collection Time: 03/23/18  4:50 AM  Result Value Ref Range   Sodium 132 (L) 135 - 145 mmol/L   Potassium 4.8 3.5 - 5.1 mmol/L   Chloride 99 98 - 111 mmol/L   CO2 26 22 - 32 mmol/L   Glucose, Bld 161 (H) 70 - 99 mg/dL   BUN 58 (H) 8 - 23 mg/dL    Creatinine, Ser 2.44 (H) 0.61 - 1.24 mg/dL   Calcium 7.9 (L) 8.9 - 10.3 mg/dL   GFR calc non Af Amer 24 (L) >60 mL/min   GFR calc Af Amer 28 (L) >60 mL/min   Anion gap 7 5 - 15  Glucose, capillary     Status: Abnormal   Collection Time: 03/23/18 11:35 AM  Result Value Ref Range   Glucose-Capillary 183 (H) 70 - 99 mg/dL    US Renal  Result Date: 03/22/2018 CLINICAL DATA:  Acute renal failure, history hypertension, diabetes mellitus EXAM: RENAL / URINARY TRACT ULTRASOUND COMPLETE COMPARISON:  None FINDINGS: Right Kidney: Renal measurements: 9.9 x 6.2 x 5.3 cm = volume: 170 mL. Age-related cortical thinning. Increased cortical echogenicity. No mass, hydronephrosis, or shadowing calcification. Left Kidney: Renal measurements: 9.4 x 6.4 x 5.7 cm = volume: 181 mL. Age-related renal cortical thinning. Increased cortical echogenicity. No mass, hydronephrosis or shadowing calcification. Bladder: Appears normal for degree of bladder distention. Incidentally noted prostatic enlargement with enlargement of the central lobe protruding into bladder base. Prostate gland measures 5.4 x 4.7 x 5.6 cm = 76 mL. IMPRESSION: Medical renal disease changes and age-related cortical atrophy of both kidneys. Prostatic enlargement. Electronically Signed   By: Lavonia Dana M.D.   On: 03/22/2018 10:26    Assessment/Plan: 4 Days Post-Op   Active Problems:   S/P TKR (total knee replacement) using cement, left  Patient's creatinine is improving.  I spoke with Dr. Holley Raring from nephrology.  He agreed with fluid support.  He is happy to see the patient has an outpatient for follow-up of his proteinuria and elevated creatinine.  I am going to order the patient 1 unit of packed red blood cells today given his some hemoglobin of 7.8.  Patient's elevated creatinine may be prerenal in the unit of PRBCs will likely improve his renal function.  I will check the patient's dressings again tomorrow and change if necessary.  She will  continue with physical therapy.  Patient will likely be discharged home tomorrow if his creatinine continues to improve and there is no issues with his post operative blisters.    Thornton Park , MD 03/23/2018, 1:49 PM

## 2018-03-24 DIAGNOSIS — Z23 Encounter for immunization: Secondary | ICD-10-CM | POA: Diagnosis not present

## 2018-03-24 LAB — BASIC METABOLIC PANEL
Anion gap: 5 (ref 5–15)
BUN: 53 mg/dL — AB (ref 8–23)
CHLORIDE: 102 mmol/L (ref 98–111)
CO2: 26 mmol/L (ref 22–32)
Calcium: 7.8 mg/dL — ABNORMAL LOW (ref 8.9–10.3)
Creatinine, Ser: 2.13 mg/dL — ABNORMAL HIGH (ref 0.61–1.24)
GFR calc Af Amer: 33 mL/min — ABNORMAL LOW (ref >60)
GFR calc non Af Amer: 28 mL/min — ABNORMAL LOW (ref >60)
GLUCOSE: 110 mg/dL — AB (ref 70–99)
Potassium: 4.8 mmol/L (ref 3.5–5.1)
SODIUM: 133 mmol/L — AB (ref 135–145)

## 2018-03-24 LAB — TYPE AND SCREEN
ABO/RH(D): B POS
ANTIBODY SCREEN: NEGATIVE
UNIT DIVISION: 0

## 2018-03-24 LAB — CBC
HEMATOCRIT: 23.7 % — AB (ref 39.0–52.0)
HEMOGLOBIN: 8.1 g/dL — AB (ref 13.0–17.0)
MCH: 28.4 pg (ref 26.0–34.0)
MCHC: 34.2 g/dL (ref 30.0–36.0)
MCV: 83.2 fL (ref 80.0–100.0)
Platelets: 178 10*3/uL (ref 150–400)
RBC: 2.85 MIL/uL — ABNORMAL LOW (ref 4.22–5.81)
RDW: 13.1 % (ref 11.5–15.5)
WBC: 8.6 10*3/uL (ref 4.0–10.5)
nRBC: 0 % (ref 0.0–0.2)

## 2018-03-24 LAB — BPAM RBC
BLOOD PRODUCT EXPIRATION DATE: 201912062359
ISSUE DATE / TIME: 201911161713
Unit Type and Rh: 7300

## 2018-03-24 LAB — GLUCOSE, CAPILLARY
GLUCOSE-CAPILLARY: 197 mg/dL — AB (ref 70–99)
Glucose-Capillary: 94 mg/dL (ref 70–99)
Glucose-Capillary: 164 mg/dL — ABNORMAL HIGH (ref 70–99)

## 2018-03-24 MED ORDER — BISACODYL 5 MG PO TBEC: 10.0000 mg | DELAYED_RELEASE_TABLET | Freq: Every day | ORAL | 0 refills | Status: DC | PRN

## 2018-03-24 MED ORDER — FERROUS SULFATE 325 (65 FE) MG PO TABS: 325.0000 mg | ORAL_TABLET | Freq: Two times a day (BID) | ORAL | 3 refills | Status: DC

## 2018-03-24 MED ORDER — METOPROLOL TARTRATE 50 MG PO TABS: 50.0000 mg | ORAL_TABLET | Freq: Two times a day (BID) | ORAL | Status: DC

## 2018-03-24 MED ORDER — METOPROLOL TARTRATE 50 MG PO TABS: 50.0000 mg | ORAL_TABLET | Freq: Two times a day (BID) | ORAL | 0 refills | Status: DC

## 2018-03-24 MED ORDER — DOCUSATE SODIUM 100 MG PO CAPS: 100.0000 mg | ORAL_CAPSULE | Freq: Two times a day (BID) | ORAL | 0 refills | Status: DC

## 2018-03-24 MED ORDER — OXYCODONE HCL 5 MG PO TABS: 5.0000 mg | ORAL_TABLET | ORAL | 0 refills | Status: DC | PRN

## 2018-03-24 MED ORDER — ENOXAPARIN SODIUM 40 MG/0.4ML ~~LOC~~ SOLN: 40.0000 mg | SUBCUTANEOUS | 1 refills | Status: DC

## 2018-03-24 MED ORDER — METOPROLOL TARTRATE 25 MG PO TABS
25.0000 mg | ORAL_TABLET | Freq: Once | ORAL | Status: AC
  Administered 2018-03-24: 25 mg via ORAL
  Filled 2018-03-24: qty 1

## 2018-03-24 NOTE — Progress Notes (Signed)
Subjective:  POD #5 s/p left TKA.  Patient reports left knee pain as mild.  The creatinine is much improved today following 1 unit of PRBC yesterday.  Hemoglobin is 8.1.  Patient feels well and has no complaints.  Objective:   VITALS:   Vitals:   03/23/18 2013 03/23/18 2352 03/24/18 0746 03/24/18 0955  BP: 127/73 (!) 122/51 (!) 152/72 (!) 148/71  Pulse: 94 (!) 117 99 100  Resp: 18 17 18    Temp: 99.1 F (37.3 C) 99 F (37.2 C) 98.6 F (37 C)   TempSrc: Oral Oral Oral   SpO2: 95% 94% 100%   Weight:        PHYSICAL EXAM: Left lower extremity: There is mild drainage on the dressings I placed yesterday overlying his blisters.  There is no drainage on the Ace wraps.  Patient's compartments are soft and compressible. Neurovascular intact Sensation intact distally Intact pulses distally Dorsiflexion/Plantar flexion intact Incision: scant drainage No cellulitis present Compartment soft  LABS  Results for orders placed or performed during the hospital encounter of 03/19/18 (from the past 24 hour(s))  Prepare RBC     Status: None   Collection Time: 03/23/18  1:49 PM  Result Value Ref Range   Order Confirmation      ORDER PROCESSED BY BLOOD BANK Performed at [facility], [address] [place], [place] [address]   Type and screen     Status: None   Collection Time: 03/23/18  2:37 PM  Result Value Ref Range   ABO/RH(D) B POS    Antibody Screen NEG    Sample Expiration 03/26/2018    Unit Number [id number]    Blood Component Type RED CELLS,LR    Unit division 00    Status of Unit ISSUED,FINAL    Transfusion Status OK TO TRANSFUSE    Crossmatch Result      Compatible Performed at [facility], [address] [place], [place] [address]   Glucose, capillary     Status: Abnormal   Collection Time: 03/23/18  4:43 PM  Result Value Ref Range   Glucose-Capillary 185 (H) 70 - 99 mg/dL   Comment 1 Notify RN   Glucose, capillary     Status: Abnormal   Collection Time: 03/23/18  9:11 PM  Result Value Ref Range   Glucose-Capillary 209 (H) 70 - 99 mg/dL  Basic metabolic panel     Status: Abnormal   Collection Time: 03/24/18  4:43 AM  Result Value Ref Range   Sodium 133 (L) 135 - 145 mmol/L   Potassium 4.8 3.5 - 5.1 mmol/L   Chloride 102 98 - 111 mmol/L   CO2 26 22 - 32 mmol/L   Glucose, Bld 110 (H) 70 - 99 mg/dL   BUN 53 (H) 8 - 23 mg/dL   Creatinine, Ser 2.13 (H) 0.61 - 1.24 mg/dL   Calcium 7.8 (L) 8.9 - 10.3 mg/dL   GFR calc non Af Amer 28 (L) >60 mL/min   GFR calc Af Amer 33 (L) >60 mL/min   Anion gap 5 5 - 15  CBC     Status: Abnormal   Collection Time: 03/24/18  4:43 AM  Result Value Ref Range   WBC 8.6 4.0 - 10.5 K/uL   RBC 2.85 (L) 4.22 - 5.81 MIL/uL   Hemoglobin 8.1 (L) 13.0 - 17.0 g/dL   HCT 23.7 (L) 39.0 - 52.0 %   MCV 83.2 80.0 - 100.0 fL   MCH 28.4 26.0 - 34.0  pg   MCHC 34.2 30.0 - 36.0 g/dL   RDW 13.1 11.5 - 15.5 %   Platelets 178 150 - 400 K/uL   nRBC 0.0 0.0 - 0.2 %  Glucose, capillary     Status: None   Collection Time: 03/24/18  8:21 AM  Result Value Ref Range   Glucose-Capillary 94 70 - 99 mg/dL  Glucose, capillary     Status: Abnormal   Collection Time: 03/24/18 11:45 AM  Result Value Ref Range   Glucose-Capillary 197 (H) 70 - 99 mg/dL   Comment 1 Notify RN     No results found.  Assessment/Plan: 5 Days Post-Op   Active Problems:   S/P TKR (total knee replacement) using cement, left  I discussed this case with Dr. Margaretmary Eddy this morning.  She is in agreement with discharge today from a medical standpoint.  She is recommended stopping the patient's hydrochlorothiazide and lisinopril.  Patient's metoprolol will be increased to 50 mg.  Patient will follow-up with Dr. Holley Raring in nephrology in 1 week.  She will follow-up with me at the end of next week for wound check and dressing change.  He will keep his current dressings on until follow-up.  Patient will continue to elevate the left lower extremity at  home.  He will continue using his [employer].  He can use his knee immobilizer at night to help with knee extension.  He is weightbearing as tolerated on left lower extremity.  Patient has been recommended for home health physical therapy.    Thornton Park , MD 03/24/2018, 12:08 PM

## 2018-03-24 NOTE — Care Management Note (Signed)
Case Management Note  Patient Details  Name: Keith Howe MRN: [id number] Date of Birth: 12-23-1940  Subjective/Objective:   Patient to be discharged per MD order. Orders in place for home health services. Previous RNCM began workup for home health via [facility]. Patient and spouse still prefer this as the plan. Confirmed referral with Malachy Mood at [facility] who has plans for start of care tomorrow. DME rolling walker and bedside commode delivered via [place] [facility]. Spouse to transport. No further needs. Ines Bloomer RN BSN RNCM [phone]                   Action/Plan:   Expected Discharge Date:  03/24/18               Expected Discharge Plan:     In-House Referral:     Discharge planning Services  CM Consult  Post Acute Care Choice:  Durable Medical Equipment Choice offered to:  Patient, Spouse  DME Arranged:  3-N-1, Walker rolling DME Agency:  [place]:  PT [place]:  [place]  Status of Service:  Completed, signed off  If discussed at [place] of Stay Meetings, dates discussed:    Additional Comments:  Latanya Maudlin, RN 03/24/2018, 1:31 PM

## 2018-03-24 NOTE — Progress Notes (Signed)
Keith Howe at [place] NAME: Keith Howe    MR#:  [id number]  DATE OF BIRTH:  06-20-1940  SUBJECTIVE:  Out of bed to chair with no complaints.  Received IV fluid boluses and IV fluids.  No overnight events.  Wants to go home  REVIEW OF SYSTEMS:  Review of Systems  Constitutional: Negative for chills, diaphoresis and fever.  HENT: Negative for congestion and sore throat.   Eyes: Negative for blurred vision and double vision.  Respiratory: Negative for cough and shortness of breath.   Cardiovascular: Negative for chest pain, palpitations, orthopnea and leg swelling.  Gastrointestinal: Negative for abdominal pain, nausea and vomiting.  Genitourinary: Negative for dysuria, frequency and urgency.  Musculoskeletal: Negative for back pain and myalgias.  Neurological: Negative for dizziness and headaches.  Psychiatric/Behavioral: Negative for depression.    DRUG ALLERGIES:  No Known Allergies VITALS:  Blood pressure (!) 148/71, pulse 100, temperature 98.6 F (37 C), temperature source Oral, resp. rate 18, weight 115.8 kg, SpO2 100 %. PHYSICAL EXAMINATION:  Physical Exam  GENERAL:  77 y.o.-year-old obese patient sitting up in chair with no acute distress. EYES: Pupils equal, round, reactive to light and accommodation. No scleral icterus. Extraocular muscles intact.  HEENT: Head atraumatic, normocephalic. Oropharynx and nasopharynx clear.  Moist mucous membranes. NECK:  Supple, no jugular venous distention. No thyroid enlargement, no tenderness.  LUNGS: Normal breath sounds bilaterally, no wheezing, rales,rhonchi or crepitation. No use of accessory muscles of respiration.  CARDIOVASCULAR: RRR, S1, S2 normal. No murmurs, rubs, or gallops.  ABDOMEN: Soft, nontender, nondistended. Bowel sounds present. No organomegaly or mass.  EXTREMITIES: No pedal edema, cyanosis, or clubbing.   +Left leg immobilizer in place NEUROLOGIC: Cranial nerves II  through XII are intact.  No focal weakness, sensation intact. Gait not checked.  PSYCHIATRIC: The patient is alert and oriented x 3.  SKIN: No obvious rash, lesion, or ulcer.  LABORATORY PANEL:  Male CBC Recent Labs  Lab 03/24/18 0443  WBC 8.6  HGB 8.1*  HCT 23.7*  PLT 178   ------------------------------------------------------------------------------------------------------------------ Chemistries  Recent Labs  Lab 03/24/18 0443  NA 133*  K 4.8  CL 102  CO2 26  GLUCOSE 110*  BUN 53*  CREATININE 2.13*  CALCIUM 7.8*   RADIOLOGY:  No results found. ASSESSMENT AND PLAN:   AKI in CKD III- Cr baseline seems to be at 1.57-likely prerenal from dehydration  2.38-2.66-2.44-2.13 this morning.  Will discharge patient from medical standpoint.  Patient is recommended to follow-up with Dr. Zollie Howe nephrology in 4 to 5 days as an outpatient, nephrology to consider repeating BMP during the follow-up visit No symptoms of urinary obstruction. -Renal US unremarkable Patient has received fluid boluses we will continue IV fluids and monitor renal function closely -Encouraged to increase oral fluid intake  -Continue holding lisinopril and HCTZ   Acute blood loss anemia-normocytic with a component of hemodilution from IV fluids Cr has been trending down since surgery.  Hgb 12.3 on admission and 8.4--7.8--8.1 today. -Negative FOBT -Folate is normal B12 is below normal Iron at 10 with TIBC 231.  IV iron and iron supplements -Needs CBC rechecked as an outpatient -Outpatient GI and hematology follow-up if no improvement clinically  Hypertension- BPs normal today -Continue metoprolol at 50 mg p.o. twice daily -Holding lisinopril and HCTZ -Monitor BPs closely  Type 2 diabetes- blood sugars continue to be high -Increased Lantus to 50 units daily -Continue moderate SSI  Osteoarthritis with s/p left  total knee replacement on 11/12. -Management per orthopedics. -Pain control -PT  recommending outpatient PT, rolling walker with 5 inch wheels  BPH- stable -Continue home medications  DVT prophylaxis- Lovenox   Okay to discharge patient from medical standpoint.  Will sign off discussed with Dr. Mack Howe  All the records are reviewed and case discussed with Care Management/Social Worker. Management plans discussed with the patient, wife at bedside and they are in agreement.  CODE STATUS: Full Code  TOTAL TIME TAKING CARE OF THIS PATIENT: 35 minutes.   More than 50% of the time was spent in counseling/coordination of care: YES  POSSIBLE D/C today, DEPENDING ON CLINICAL CONDITION.   Keith Howe M.D on 03/24/2018 at 10:52 AM  Between 7am to 6pm - Pager - [phone]  After 6pm go to [email or web address] - Proofreader  [facility]  Office  [phone]  CC: Primary care physician; Keith [place], MD  Note: This dictation was prepared with Dragon dictation along with smaller phrase technology. Any transcriptional errors that result from this process are unintentional.

## 2018-03-24 NOTE — Discharge Summary (Signed)
Physician Discharge Summary  Patient ID: Keith Howe MRN: [id number] DOB/AGE: [birth year] 76 y.o.  Admit date: 03/19/2018 Discharge date: 03/24/2018  Admission Diagnoses:  OSTEOARTHRITIS OF LEFT KNEE  <principal problem not specified>  Discharge Diagnoses:  OSTEOARTHRITIS OF LEFT KNEE  Active Problems:   S/P TKR (total knee replacement) using cement, left Acute on chronic renal failure. Post-op hypotension Post-op anemia  Past Medical History:  Diagnosis Date  . BPH (benign prostatic hyperplasia)   . Diabetes mellitus without complication ([place])   . HOH (hard of hearing)    AIDS  . Hypertension   . Pain    CHRONIC LBP    Surgeries: Procedure(s): LEFT TOTAL KNEE ARTHROPLASTY on 03/19/2018   Consultants (if any): Treatment Team:  Dillon Bjork, MD  Discharged Condition: Improved  Hospital Course: Keith Howe is an 77 y.o. male who was admitted 03/19/2018 with a diagnosis of  OSTEOARTHRITIS OF LEFT KNEE and went to the operating room on 03/19/2018 and underwent an uncomplicated left total knee arthroplasty.    He was given perioperative antibiotics:  Anti-infectives (From admission, onward)   Start     Dose/Rate Route Frequency Ordered Stop   03/19/18 1400  ceFAZolin (ANCEF) IVPB 2g/100 mL premix     2 g 200 mL/hr over 30 Minutes Intravenous Every 6 hours 03/19/18 1246 03/19/18 2226   03/19/18 0644  clindamycin (CLEOCIN) 900 MG/50ML IVPB    Note to Pharmacy:  Rivka Spring   : cabinet override      03/19/18 0644 03/19/18 0807   03/19/18 0644  ceFAZolin (ANCEF) 2-4 GM/100ML-% IVPB    Note to Pharmacy:  Rivka Spring   : cabinet override      03/19/18 0644 03/19/18 0819   03/19/18 0600  ceFAZolin (ANCEF) IVPB 2g/100 mL premix     2 g 200 mL/hr over 30 Minutes Intravenous On call to O.R. 03/18/18 2200 03/19/18 0839   03/18/18 2215  clindamycin (CLEOCIN) IVPB 900 mg     900 mg 100 mL/hr over 30 Minutes Intravenous  Once 03/18/18 2200 03/19/18 0807    .  He was given sequential compression devices, early ambulation, and lovenox for DVT prophylaxis.  Patient had postop hypotension in the first 24 hours postop.  He was supported with fluid.  Patient had increasing creatinine.  Hospital service was consulted.  Patient's Foley catheter was removed on postop day 1.  His dressing was changed on postop day #2.  Patient developed blisters as a result of lower leg swelling postoperatively.  The blisters were drained and sterilely dressed and treated with antibiotic ointment and sterile dressings.  Patient's acute renal failure improved with fluid support and a infusion of a single unit of PRBCs.  Patient was recommended for home health physical therapy by the physical therapy department here at [facility] due to the patient's issues with balance.  He benefited maximally from the hospital stay.   Given his clinical improvement he is prepared for discharge home on postop day #5.  Recent vital signs:  Vitals:   03/24/18 0746 03/24/18 0955  BP: (!) 152/72 (!) 148/71  Pulse: 99 100  Resp: 18   Temp: 98.6 F (37 C)   SpO2: 100%     Recent laboratory studies:  Lab Results  Component Value Date   HGB 8.1 (L) 03/24/2018   HGB 7.8 (L) 03/23/2018   HGB 8.4 (L) 03/22/2018   Lab Results  Component Value Date   WBC 8.6 03/24/2018   PLT 178 03/24/2018  Lab Results  Component Value Date   INR 1.01 03/06/2018   Lab Results  Component Value Date   NA 133 (L) 03/24/2018   K 4.8 03/24/2018   CL 102 03/24/2018   CO2 26 03/24/2018   BUN 53 (H) 03/24/2018   CREATININE 2.13 (H) 03/24/2018   GLUCOSE 110 (H) 03/24/2018    Discharge Medications:   Allergies as of 03/24/2018   No Known Allergies     Medication List    STOP taking these medications   lisinopril-hydrochlorothiazide 20-12.5 MG tablet Commonly known as:  PRINZIDE,ZESTORETIC     TAKE these medications   aspirin EC 81 MG tablet Take 81 mg by mouth daily.   bisacodyl 5 MG EC  tablet Commonly known as:  DULCOLAX Take 2 tablets (10 mg total) by mouth daily as needed for moderate constipation.   cholecalciferol 1000 units tablet Commonly known as:  VITAMIN D Take 1,000 Units by mouth daily.   docusate sodium 100 MG capsule Commonly known as:  COLACE Take 1 capsule (100 mg total) by mouth 2 (two) times daily.   dutasteride 0.5 MG capsule Commonly known as:  AVODART Take 0.5 mg by mouth daily.   enoxaparin 40 MG/0.4ML injection Commonly known as:  LOVENOX Inject 0.4 mLs (40 mg total) into the skin daily. Start taking on:  03/25/2018   ferrous sulfate 325 (65 FE) MG tablet Take 1 tablet (325 mg total) by mouth 2 (two) times daily with a meal.   Fish Oil 1000 MG Caps Take 1,000 mg by mouth daily.   lovastatin 40 MG tablet Commonly known as:  MEVACOR Take 40 mg by mouth every morning.   metoprolol tartrate 50 MG tablet Commonly known as:  LOPRESSOR Take 1 tablet (50 mg total) by mouth 2 (two) times daily.   NOVOLOG FLEXPEN 100 UNIT/ML FlexPen Generic drug:  insulin aspart Inject 7 Units into the skin 3 (three) times daily before meals.   oxyCODONE 5 MG immediate release tablet Commonly known as:  Oxy IR/ROXICODONE Take 1 tablet (5 mg total) by mouth every 4 (four) hours as needed for moderate pain (pain score 4-6).   PROSTATE SUPPORT PO Take 2 capsules by mouth daily.   tamsulosin 0.4 MG Caps capsule Commonly known as:  FLOMAX Take 0.4 mg by mouth daily after breakfast.   TRESIBA FLEXTOUCH 200 UNIT/ML Sopn Generic drug:  Insulin Degludec Inject 80 Units into the muscle every morning.   TRULICITY 1.5 WF/0.9NA Sopn Generic drug:  Dulaglutide Inject 1.5 mg into the muscle every Wednesday.            Durable Medical Equipment  (From admission, onward)         Start     Ordered   03/21/18 1733  For home use only DME Bedside commode  Once    Question:  Patient needs a bedside commode to treat with the following condition  Answer:   Total knee replacement status, right   03/21/18 1734          Diagnostic Studies: Chest 2 View  Result Date: 03/07/2018 CLINICAL DATA:  Preop.  Total knee arthroplasty. EXAM: CHEST - 2 VIEW COMPARISON:  None. FINDINGS: Normal heart size. Lungs clear. No pneumothorax. No pleural effusion. IMPRESSION: No active cardiopulmonary disease. Electronically Signed   By: Marybelle Killings M.D.   On: 03/07/2018 08:12   US Renal  Result Date: 03/22/2018 CLINICAL DATA:  Acute renal failure, history hypertension, diabetes mellitus EXAM: RENAL / URINARY TRACT ULTRASOUND COMPLETE  COMPARISON:  None FINDINGS: Right Kidney: Renal measurements: 9.9 x 6.2 x 5.3 cm = volume: 170 mL. Age-related cortical thinning. Increased cortical echogenicity. No mass, hydronephrosis, or shadowing calcification. Left Kidney: Renal measurements: 9.4 x 6.4 x 5.7 cm = volume: 181 mL. Age-related renal cortical thinning. Increased cortical echogenicity. No mass, hydronephrosis or shadowing calcification. Bladder: Appears normal for degree of bladder distention. Incidentally noted prostatic enlargement with enlargement of the central lobe protruding into bladder base. Prostate gland measures 5.4 x 4.7 x 5.6 cm = 76 mL. IMPRESSION: Medical renal disease changes and age-related cortical atrophy of both kidneys. Prostatic enlargement. Electronically Signed   By: Lavonia Dana M.D.   On: 03/22/2018 10:26   Dg Knee Left Port  Result Date: 03/19/2018 CLINICAL DATA:  Status post left knee replacement EXAM: PORTABLE LEFT KNEE - 1-2 VIEW COMPARISON:  None. FINDINGS: Left knee prosthesis is seen. No loosening or acute bony abnormality is noted. No soft tissue changes are seen. IMPRESSION: Status post left knee replacement Electronically Signed   By: Keith Catalina M.D.   On: 03/19/2018 11:33    Disposition: Discharge disposition: 01-Home or Self Care       Discharge Instructions    Call MD / Call 911   Complete by:  As directed    If you  experience chest pain or shortness of breath, CALL 911 and be transported to the hospital emergency room.  If you develope a fever above 101 F, pus (white drainage) or increased drainage or redness at the wound, or calf pain, call your surgeon's office.   Constipation Prevention   Complete by:  As directed    Drink plenty of fluids.  Prune juice may be helpful.  You may use a stool softener, such as Colace (over the counter) 100 mg twice a day.  Use MiraLax (over the counter) for constipation as needed.   Diet - low sodium heart healthy   Complete by:  As directed    Discharge instructions   Complete by:  As directed    Continue Weightbearing as tolerated on the left lower extremity. Continue to use TED stockings until follow-up on right leg. Patient may remove them at night for sleep. Elevate the left lower extremity whenever possible. Continue to use knee immobilizer at night or when lying in bed or when elevating the operative leg. The patient may remove the knee immobilizer to perform exercises or sit in a chair. Continue using Polar Care for comfort. Keep incision clean and dry. Cover the the left knee incision during showers with a plastic bag or Saran wrap. Take lovenox injection 40 mg daily a day for blood clot prevention. Continue to work on knee range of motion exercises at home as instructed by physical therapy. Continue to use a walker for assistance with ambulation until follow-up.  Home health PT will be set up.   Driving restrictions   Complete by:  As directed    No driving for 4-6 weeks   Increase activity slowly as tolerated   Complete by:  As directed    Lifting restrictions   Complete by:  As directed    No lifting for 12-16 weeks      Follow-up Information    Lateef, Munsoor, MD. Call in 4 day(s).   Specialty:  Internal Medicine Why:  AKI  Contact information: [address] [place] [address] [phone]              Follow up with  Dr. Mack Guise in 1  week.  Patient should already have his appointment.   Call the office with any questions or concerns  [phone].   [facility], [address], [place]       Signed: Thornton Park ,MD 03/24/2018, 12:27 PM

## 2018-03-24 NOTE — Discharge Instructions (Signed)
Follow-up with nephrology Dr. Zollie Scale in 4 to 5 days please repeat BMP during the follow-up visit

## 2018-03-24 NOTE — Progress Notes (Signed)
Physical Therapy Treatment Patient Details Name: Keith Howe MRN: [id number] DOB: 1940/09/25 Today's Date: 03/24/2018    History of Present Illness admitted for acute hospitalization status post L TKR (03/19/18), WBAT.    PT Comments    Patient continues to be inconsistent with safety/indep of sit/stand transfers, at times, requiring return to sit to prevent fall (R LE sliding from under BOS).  Technique improved with cuing and repetition, but demonstrates limited carry-over of information between sessions.  Once upright, able to perform mobility with RW, cga, but generally cautious and guarded with mobility; limited balance reactions evident.  Constant cuing for L LE mechanics, postural extension and overall gait performance. Mod SOB (mild wheezing) audible with exertion; HR elevation to 140s with gait efforts.  Denies chest pain/pressure.  RN informed/aware. Patient plans to stay in lower level of home (no stairs to enter, no stairs to access bed/bathroom) immediately upon dicharge; stairs not required at this time.  Will need additional stair training prior to indep attempts at stairs in home environment (required to access master bed/bathroom).    Follow Up Recommendations  Home health PT     Equipment Recommendations       Recommendations for Other Services       Precautions / Restrictions Precautions Precautions: Fall Precaution Comments: Watch HR Restrictions Weight Bearing Restrictions: Yes LLE Weight Bearing: Weight bearing as tolerated    Mobility  Bed Mobility Overal bed mobility: Modified Independent Bed Mobility: Supine to Sit           General bed mobility comments: self-assisting L LE over edge of bed (using bilat UEs)  Transfers Overall transfer level: Needs assistance Equipment used: Rolling walker (2 wheeled) Transfers: Sit to/from Stand Sit to Stand: Min guard;Min assist         General transfer comment: difficulty with foot placement with  initial sit/stand requiring return to sitting on bed (R LE slipping from under BOS); improved with continued education and repetition  Ambulation/Gait Ambulation/Gait assistance: Min guard Gait Distance (Feet): 200 Feet Assistive device: Rolling walker (2 wheeled)   Gait velocity: 10' walk time, 13-14 seconds   General Gait Details: very short step length, forward flexed posture; limited knee flex/ext and heel strike/toe off throughout gait cycle.  Min cuing for increased step length, postural extension throughout gait cycle.   Stairs             Wheelchair Mobility    Modified Rankin (Stroke Patients Only)       Balance Overall balance assessment: Needs assistance Sitting-balance support: No upper extremity supported;Feet supported Sitting balance-Leahy Scale: Good     Standing balance support: Bilateral upper extremity supported Standing balance-Leahy Scale: Fair                              Cognition Arousal/Alertness: Awake/alert Behavior During Therapy: WFL for tasks assessed/performed Overall Cognitive Status: Within Functional Limits for tasks assessed                                        Exercises Total Joint Exercises Goniometric ROM: L knee: 5-75 degrees, limited by pain Other Exercises Other Exercises: Seated LE therex, 1x15, AROM: ankle pumps, LAQs with emphasis on end-range flex/ext, marching.  Reviewed handout with written/pictorial descriptions of HEP Other Exercises: Verbally reviewed technique for car transfers; patient voiced understanding Other Exercises:  Reviewed use of BSC and educated on impact of seating surfaces (height, armrests) on safety/indep with functional transfers; encouraged seating choices with elevated seat heigth and with bilat arm rests.  Patient voiced understanding.    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 : None Difficulty sitting down on and standing up from a chair with arms (e.g., wheelchair, bedside commode, etc,.)?: A Little Help needed moving to and from a bed to chair (including a wheelchair)?: A Little Help needed walking in hospital room?: A Little Help needed climbing 3-5 steps with a railing? : A Little 6 Click Score: 20    End of Session Equipment Utilized During Treatment: Gait belt Activity Tolerance: Patient tolerated treatment well Patient left: in chair;with call bell/phone within reach;with chair alarm set Nurse Communication: Mobility status PT Visit Diagnosis: Muscle weakness (generalized) (M62.81);Difficulty in walking, not elsewhere classified (R26.2);Pain Pain - Right/Left: Left Pain - part of body: Knee     Time: 4680-3212 PT Time Calculation (min) (ACUTE ONLY): 34 min  Charges:  $Gait Training: 8-22 mins $Therapeutic Exercise: 8-22 mins                     Josslyn Ciolek H. Owens Shark, PT, DPT, NCS 03/24/18, 10:22 AM [phone]

## 2018-03-25 LAB — GLUCOSE, CAPILLARY: GLUCOSE-CAPILLARY: 134 mg/dL — AB (ref 70–99)

## 2018-03-26 DIAGNOSIS — Z96652 Presence of left artificial knee joint: Secondary | ICD-10-CM | POA: Diagnosis not present

## 2018-03-26 DIAGNOSIS — Z4801 Encounter for change or removal of surgical wound dressing: Secondary | ICD-10-CM | POA: Diagnosis not present

## 2018-03-26 DIAGNOSIS — Z794 Long term (current) use of insulin: Secondary | ICD-10-CM | POA: Diagnosis not present

## 2018-03-26 DIAGNOSIS — M545 Low back pain: Secondary | ICD-10-CM | POA: Diagnosis not present

## 2018-03-26 DIAGNOSIS — I10 Essential (primary) hypertension: Secondary | ICD-10-CM | POA: Diagnosis not present

## 2018-03-26 DIAGNOSIS — E119 Type 2 diabetes mellitus without complications: Secondary | ICD-10-CM | POA: Diagnosis not present

## 2018-03-26 DIAGNOSIS — N400 Enlarged prostate without lower urinary tract symptoms: Secondary | ICD-10-CM | POA: Diagnosis not present

## 2018-03-26 DIAGNOSIS — G8929 Other chronic pain: Secondary | ICD-10-CM | POA: Diagnosis not present

## 2018-03-26 DIAGNOSIS — Z471 Aftercare following joint replacement surgery: Secondary | ICD-10-CM | POA: Diagnosis not present

## 2018-03-28 DIAGNOSIS — G8929 Other chronic pain: Secondary | ICD-10-CM | POA: Diagnosis not present

## 2018-03-28 DIAGNOSIS — Z96652 Presence of left artificial knee joint: Secondary | ICD-10-CM | POA: Diagnosis not present

## 2018-03-28 DIAGNOSIS — Z471 Aftercare following joint replacement surgery: Secondary | ICD-10-CM | POA: Diagnosis not present

## 2018-03-28 DIAGNOSIS — Z794 Long term (current) use of insulin: Secondary | ICD-10-CM | POA: Diagnosis not present

## 2018-03-28 DIAGNOSIS — N400 Enlarged prostate without lower urinary tract symptoms: Secondary | ICD-10-CM | POA: Diagnosis not present

## 2018-03-28 DIAGNOSIS — M545 Low back pain: Secondary | ICD-10-CM | POA: Diagnosis not present

## 2018-03-28 DIAGNOSIS — E119 Type 2 diabetes mellitus without complications: Secondary | ICD-10-CM | POA: Diagnosis not present

## 2018-03-28 DIAGNOSIS — Z4801 Encounter for change or removal of surgical wound dressing: Secondary | ICD-10-CM | POA: Diagnosis not present

## 2018-03-28 DIAGNOSIS — I10 Essential (primary) hypertension: Secondary | ICD-10-CM | POA: Diagnosis not present

## 2018-03-29 DIAGNOSIS — N400 Enlarged prostate without lower urinary tract symptoms: Secondary | ICD-10-CM | POA: Diagnosis not present

## 2018-03-29 DIAGNOSIS — I10 Essential (primary) hypertension: Secondary | ICD-10-CM | POA: Diagnosis not present

## 2018-03-29 DIAGNOSIS — G8929 Other chronic pain: Secondary | ICD-10-CM | POA: Diagnosis not present

## 2018-03-29 DIAGNOSIS — Z794 Long term (current) use of insulin: Secondary | ICD-10-CM | POA: Diagnosis not present

## 2018-03-29 DIAGNOSIS — Z96652 Presence of left artificial knee joint: Secondary | ICD-10-CM | POA: Diagnosis not present

## 2018-03-29 DIAGNOSIS — Z4801 Encounter for change or removal of surgical wound dressing: Secondary | ICD-10-CM | POA: Diagnosis not present

## 2018-03-29 DIAGNOSIS — E119 Type 2 diabetes mellitus without complications: Secondary | ICD-10-CM | POA: Diagnosis not present

## 2018-03-29 DIAGNOSIS — Z471 Aftercare following joint replacement surgery: Secondary | ICD-10-CM | POA: Diagnosis not present

## 2018-03-29 DIAGNOSIS — M545 Low back pain: Secondary | ICD-10-CM | POA: Diagnosis not present

## 2018-04-01 DIAGNOSIS — N400 Enlarged prostate without lower urinary tract symptoms: Secondary | ICD-10-CM | POA: Diagnosis not present

## 2018-04-01 DIAGNOSIS — Z471 Aftercare following joint replacement surgery: Secondary | ICD-10-CM | POA: Diagnosis not present

## 2018-04-01 DIAGNOSIS — I10 Essential (primary) hypertension: Secondary | ICD-10-CM | POA: Diagnosis not present

## 2018-04-01 DIAGNOSIS — G8929 Other chronic pain: Secondary | ICD-10-CM | POA: Diagnosis not present

## 2018-04-01 DIAGNOSIS — Z96652 Presence of left artificial knee joint: Secondary | ICD-10-CM | POA: Diagnosis not present

## 2018-04-01 DIAGNOSIS — M545 Low back pain: Secondary | ICD-10-CM | POA: Diagnosis not present

## 2018-04-01 DIAGNOSIS — E119 Type 2 diabetes mellitus without complications: Secondary | ICD-10-CM | POA: Diagnosis not present

## 2018-04-01 DIAGNOSIS — Z4801 Encounter for change or removal of surgical wound dressing: Secondary | ICD-10-CM | POA: Diagnosis not present

## 2018-04-01 DIAGNOSIS — Z794 Long term (current) use of insulin: Secondary | ICD-10-CM | POA: Diagnosis not present

## 2018-04-02 DIAGNOSIS — N401 Enlarged prostate with lower urinary tract symptoms: Secondary | ICD-10-CM | POA: Diagnosis not present

## 2018-04-02 DIAGNOSIS — N400 Enlarged prostate without lower urinary tract symptoms: Secondary | ICD-10-CM | POA: Diagnosis not present

## 2018-04-02 DIAGNOSIS — G8929 Other chronic pain: Secondary | ICD-10-CM | POA: Diagnosis not present

## 2018-04-02 DIAGNOSIS — I7389 Other specified peripheral vascular diseases: Secondary | ICD-10-CM | POA: Diagnosis not present

## 2018-04-02 DIAGNOSIS — E119 Type 2 diabetes mellitus without complications: Secondary | ICD-10-CM | POA: Diagnosis not present

## 2018-04-02 DIAGNOSIS — Z794 Long term (current) use of insulin: Secondary | ICD-10-CM | POA: Diagnosis not present

## 2018-04-02 DIAGNOSIS — N184 Chronic kidney disease, stage 4 (severe): Secondary | ICD-10-CM | POA: Diagnosis not present

## 2018-04-02 DIAGNOSIS — Z6832 Body mass index (BMI) 32.0-32.9, adult: Secondary | ICD-10-CM | POA: Diagnosis not present

## 2018-04-02 DIAGNOSIS — E1065 Type 1 diabetes mellitus with hyperglycemia: Secondary | ICD-10-CM | POA: Diagnosis not present

## 2018-04-02 DIAGNOSIS — Z4801 Encounter for change or removal of surgical wound dressing: Secondary | ICD-10-CM | POA: Diagnosis not present

## 2018-04-02 DIAGNOSIS — E790 Hyperuricemia without signs of inflammatory arthritis and tophaceous disease: Secondary | ICD-10-CM | POA: Diagnosis not present

## 2018-04-02 DIAGNOSIS — M545 Low back pain: Secondary | ICD-10-CM | POA: Diagnosis not present

## 2018-04-02 DIAGNOSIS — R69 Illness, unspecified: Secondary | ICD-10-CM | POA: Diagnosis not present

## 2018-04-02 DIAGNOSIS — Z471 Aftercare following joint replacement surgery: Secondary | ICD-10-CM | POA: Diagnosis not present

## 2018-04-02 DIAGNOSIS — Z96652 Presence of left artificial knee joint: Secondary | ICD-10-CM | POA: Diagnosis not present

## 2018-04-02 DIAGNOSIS — E785 Hyperlipidemia, unspecified: Secondary | ICD-10-CM | POA: Diagnosis not present

## 2018-04-02 DIAGNOSIS — E669 Obesity, unspecified: Secondary | ICD-10-CM | POA: Diagnosis not present

## 2018-04-02 DIAGNOSIS — I10 Essential (primary) hypertension: Secondary | ICD-10-CM | POA: Diagnosis not present

## 2018-04-03 DIAGNOSIS — Z96652 Presence of left artificial knee joint: Secondary | ICD-10-CM | POA: Diagnosis not present

## 2018-04-03 DIAGNOSIS — I10 Essential (primary) hypertension: Secondary | ICD-10-CM | POA: Diagnosis not present

## 2018-04-03 DIAGNOSIS — N179 Acute kidney failure, unspecified: Secondary | ICD-10-CM | POA: Diagnosis not present

## 2018-04-03 DIAGNOSIS — Z471 Aftercare following joint replacement surgery: Secondary | ICD-10-CM | POA: Diagnosis not present

## 2018-04-03 DIAGNOSIS — Z794 Long term (current) use of insulin: Secondary | ICD-10-CM | POA: Diagnosis not present

## 2018-04-03 DIAGNOSIS — G8929 Other chronic pain: Secondary | ICD-10-CM | POA: Diagnosis not present

## 2018-04-03 DIAGNOSIS — N400 Enlarged prostate without lower urinary tract symptoms: Secondary | ICD-10-CM | POA: Diagnosis not present

## 2018-04-03 DIAGNOSIS — N183 Chronic kidney disease, stage 3 (moderate): Secondary | ICD-10-CM | POA: Diagnosis not present

## 2018-04-03 DIAGNOSIS — M545 Low back pain: Secondary | ICD-10-CM | POA: Diagnosis not present

## 2018-04-03 DIAGNOSIS — Z4801 Encounter for change or removal of surgical wound dressing: Secondary | ICD-10-CM | POA: Diagnosis not present

## 2018-04-03 DIAGNOSIS — E119 Type 2 diabetes mellitus without complications: Secondary | ICD-10-CM | POA: Diagnosis not present

## 2018-04-05 DIAGNOSIS — M545 Low back pain: Secondary | ICD-10-CM | POA: Diagnosis not present

## 2018-04-05 DIAGNOSIS — I10 Essential (primary) hypertension: Secondary | ICD-10-CM | POA: Diagnosis not present

## 2018-04-05 DIAGNOSIS — Z4801 Encounter for change or removal of surgical wound dressing: Secondary | ICD-10-CM | POA: Diagnosis not present

## 2018-04-05 DIAGNOSIS — Z96652 Presence of left artificial knee joint: Secondary | ICD-10-CM | POA: Diagnosis not present

## 2018-04-05 DIAGNOSIS — Z794 Long term (current) use of insulin: Secondary | ICD-10-CM | POA: Diagnosis not present

## 2018-04-05 DIAGNOSIS — E119 Type 2 diabetes mellitus without complications: Secondary | ICD-10-CM | POA: Diagnosis not present

## 2018-04-05 DIAGNOSIS — N400 Enlarged prostate without lower urinary tract symptoms: Secondary | ICD-10-CM | POA: Diagnosis not present

## 2018-04-05 DIAGNOSIS — G8929 Other chronic pain: Secondary | ICD-10-CM | POA: Diagnosis not present

## 2018-04-05 DIAGNOSIS — Z471 Aftercare following joint replacement surgery: Secondary | ICD-10-CM | POA: Diagnosis not present

## 2018-04-08 DIAGNOSIS — Z96652 Presence of left artificial knee joint: Secondary | ICD-10-CM | POA: Diagnosis not present

## 2018-04-08 DIAGNOSIS — E119 Type 2 diabetes mellitus without complications: Secondary | ICD-10-CM | POA: Diagnosis not present

## 2018-04-08 DIAGNOSIS — N400 Enlarged prostate without lower urinary tract symptoms: Secondary | ICD-10-CM | POA: Diagnosis not present

## 2018-04-08 DIAGNOSIS — I10 Essential (primary) hypertension: Secondary | ICD-10-CM | POA: Diagnosis not present

## 2018-04-08 DIAGNOSIS — G8929 Other chronic pain: Secondary | ICD-10-CM | POA: Diagnosis not present

## 2018-04-08 DIAGNOSIS — Z4801 Encounter for change or removal of surgical wound dressing: Secondary | ICD-10-CM | POA: Diagnosis not present

## 2018-04-08 DIAGNOSIS — Z471 Aftercare following joint replacement surgery: Secondary | ICD-10-CM | POA: Diagnosis not present

## 2018-04-08 DIAGNOSIS — Z794 Long term (current) use of insulin: Secondary | ICD-10-CM | POA: Diagnosis not present

## 2018-04-08 DIAGNOSIS — M545 Low back pain: Secondary | ICD-10-CM | POA: Diagnosis not present

## 2018-04-10 DIAGNOSIS — Z4801 Encounter for change or removal of surgical wound dressing: Secondary | ICD-10-CM | POA: Diagnosis not present

## 2018-04-10 DIAGNOSIS — G8929 Other chronic pain: Secondary | ICD-10-CM | POA: Diagnosis not present

## 2018-04-10 DIAGNOSIS — N400 Enlarged prostate without lower urinary tract symptoms: Secondary | ICD-10-CM | POA: Diagnosis not present

## 2018-04-10 DIAGNOSIS — E119 Type 2 diabetes mellitus without complications: Secondary | ICD-10-CM | POA: Diagnosis not present

## 2018-04-10 DIAGNOSIS — Z471 Aftercare following joint replacement surgery: Secondary | ICD-10-CM | POA: Diagnosis not present

## 2018-04-10 DIAGNOSIS — M545 Low back pain: Secondary | ICD-10-CM | POA: Diagnosis not present

## 2018-04-10 DIAGNOSIS — Z794 Long term (current) use of insulin: Secondary | ICD-10-CM | POA: Diagnosis not present

## 2018-04-10 DIAGNOSIS — Z96652 Presence of left artificial knee joint: Secondary | ICD-10-CM | POA: Diagnosis not present

## 2018-04-10 DIAGNOSIS — I10 Essential (primary) hypertension: Secondary | ICD-10-CM | POA: Diagnosis not present

## 2018-04-12 DIAGNOSIS — Z794 Long term (current) use of insulin: Secondary | ICD-10-CM | POA: Diagnosis not present

## 2018-04-12 DIAGNOSIS — G8929 Other chronic pain: Secondary | ICD-10-CM | POA: Diagnosis not present

## 2018-04-12 DIAGNOSIS — I10 Essential (primary) hypertension: Secondary | ICD-10-CM | POA: Diagnosis not present

## 2018-04-12 DIAGNOSIS — Z471 Aftercare following joint replacement surgery: Secondary | ICD-10-CM | POA: Diagnosis not present

## 2018-04-12 DIAGNOSIS — Z4801 Encounter for change or removal of surgical wound dressing: Secondary | ICD-10-CM | POA: Diagnosis not present

## 2018-04-12 DIAGNOSIS — N400 Enlarged prostate without lower urinary tract symptoms: Secondary | ICD-10-CM | POA: Diagnosis not present

## 2018-04-12 DIAGNOSIS — E119 Type 2 diabetes mellitus without complications: Secondary | ICD-10-CM | POA: Diagnosis not present

## 2018-04-12 DIAGNOSIS — Z96652 Presence of left artificial knee joint: Secondary | ICD-10-CM | POA: Diagnosis not present

## 2018-04-12 DIAGNOSIS — M545 Low back pain: Secondary | ICD-10-CM | POA: Diagnosis not present

## 2018-04-15 DIAGNOSIS — N183 Chronic kidney disease, stage 3 (moderate): Secondary | ICD-10-CM | POA: Diagnosis not present

## 2018-04-15 DIAGNOSIS — Z96652 Presence of left artificial knee joint: Secondary | ICD-10-CM | POA: Diagnosis not present

## 2018-04-15 DIAGNOSIS — G8929 Other chronic pain: Secondary | ICD-10-CM | POA: Diagnosis not present

## 2018-04-15 DIAGNOSIS — I10 Essential (primary) hypertension: Secondary | ICD-10-CM | POA: Diagnosis not present

## 2018-04-15 DIAGNOSIS — N400 Enlarged prostate without lower urinary tract symptoms: Secondary | ICD-10-CM | POA: Diagnosis not present

## 2018-04-15 DIAGNOSIS — M545 Low back pain: Secondary | ICD-10-CM | POA: Diagnosis not present

## 2018-04-15 DIAGNOSIS — Z4801 Encounter for change or removal of surgical wound dressing: Secondary | ICD-10-CM | POA: Diagnosis not present

## 2018-04-15 DIAGNOSIS — Z794 Long term (current) use of insulin: Secondary | ICD-10-CM | POA: Diagnosis not present

## 2018-04-15 DIAGNOSIS — Z471 Aftercare following joint replacement surgery: Secondary | ICD-10-CM | POA: Diagnosis not present

## 2018-04-15 DIAGNOSIS — E119 Type 2 diabetes mellitus without complications: Secondary | ICD-10-CM | POA: Diagnosis not present

## 2018-04-19 DIAGNOSIS — E119 Type 2 diabetes mellitus without complications: Secondary | ICD-10-CM | POA: Diagnosis not present

## 2018-04-19 DIAGNOSIS — I10 Essential (primary) hypertension: Secondary | ICD-10-CM | POA: Diagnosis not present

## 2018-04-19 DIAGNOSIS — M545 Low back pain: Secondary | ICD-10-CM | POA: Diagnosis not present

## 2018-04-19 DIAGNOSIS — Z4801 Encounter for change or removal of surgical wound dressing: Secondary | ICD-10-CM | POA: Diagnosis not present

## 2018-04-19 DIAGNOSIS — G8929 Other chronic pain: Secondary | ICD-10-CM | POA: Diagnosis not present

## 2018-04-19 DIAGNOSIS — Z794 Long term (current) use of insulin: Secondary | ICD-10-CM | POA: Diagnosis not present

## 2018-04-19 DIAGNOSIS — Z471 Aftercare following joint replacement surgery: Secondary | ICD-10-CM | POA: Diagnosis not present

## 2018-04-19 DIAGNOSIS — Z96652 Presence of left artificial knee joint: Secondary | ICD-10-CM | POA: Diagnosis not present

## 2018-04-19 DIAGNOSIS — N400 Enlarged prostate without lower urinary tract symptoms: Secondary | ICD-10-CM | POA: Diagnosis not present

## 2018-04-25 DIAGNOSIS — M25562 Pain in left knee: Secondary | ICD-10-CM | POA: Diagnosis not present

## 2018-04-25 DIAGNOSIS — M25662 Stiffness of left knee, not elsewhere classified: Secondary | ICD-10-CM | POA: Diagnosis not present

## 2018-05-13 DIAGNOSIS — M25662 Stiffness of left knee, not elsewhere classified: Secondary | ICD-10-CM | POA: Diagnosis not present

## 2018-05-13 DIAGNOSIS — M25562 Pain in left knee: Secondary | ICD-10-CM | POA: Diagnosis not present

## 2018-05-15 ENCOUNTER — Encounter (INDEPENDENT_AMBULATORY_CARE_PROVIDER_SITE_OTHER): Payer: Self-pay

## 2018-05-15 ENCOUNTER — Other Ambulatory Visit: Payer: Self-pay | Admitting: Orthopedic Surgery

## 2018-05-15 ENCOUNTER — Ambulatory Visit
Admission: RE | Admit: 2018-05-15 | Discharge: 2018-05-15 | Disposition: A | Discharge status: Home / Self Care | Payer: Medicare HMO | Source: Ambulatory Visit | Attending: Orthopedic Surgery | Admitting: Orthopedic Surgery

## 2018-05-15 DIAGNOSIS — R2242 Localized swelling, mass and lump, left lower limb: Secondary | ICD-10-CM | POA: Insufficient documentation

## 2018-05-15 DIAGNOSIS — M25562 Pain in left knee: Secondary | ICD-10-CM | POA: Diagnosis not present

## 2018-05-15 DIAGNOSIS — M25662 Stiffness of left knee, not elsewhere classified: Secondary | ICD-10-CM | POA: Diagnosis not present

## 2018-05-15 DIAGNOSIS — M7989 Other specified soft tissue disorders: Secondary | ICD-10-CM | POA: Diagnosis not present

## 2018-05-16 DIAGNOSIS — D631 Anemia in chronic kidney disease: Secondary | ICD-10-CM | POA: Diagnosis not present

## 2018-05-16 DIAGNOSIS — D472 Monoclonal gammopathy: Secondary | ICD-10-CM | POA: Diagnosis not present

## 2018-05-16 DIAGNOSIS — N183 Chronic kidney disease, stage 3 (moderate): Secondary | ICD-10-CM | POA: Diagnosis not present

## 2018-05-16 DIAGNOSIS — I10 Essential (primary) hypertension: Secondary | ICD-10-CM | POA: Diagnosis not present

## 2018-05-20 DIAGNOSIS — M25562 Pain in left knee: Secondary | ICD-10-CM | POA: Diagnosis not present

## 2018-05-20 DIAGNOSIS — M25662 Stiffness of left knee, not elsewhere classified: Secondary | ICD-10-CM | POA: Diagnosis not present

## 2018-05-22 DIAGNOSIS — M25562 Pain in left knee: Secondary | ICD-10-CM | POA: Diagnosis not present

## 2018-05-22 DIAGNOSIS — M25662 Stiffness of left knee, not elsewhere classified: Secondary | ICD-10-CM | POA: Diagnosis not present

## 2018-05-27 DIAGNOSIS — M25662 Stiffness of left knee, not elsewhere classified: Secondary | ICD-10-CM | POA: Diagnosis not present

## 2018-05-27 DIAGNOSIS — M25562 Pain in left knee: Secondary | ICD-10-CM | POA: Diagnosis not present

## 2018-05-29 DIAGNOSIS — M25562 Pain in left knee: Secondary | ICD-10-CM | POA: Diagnosis not present

## 2018-05-29 DIAGNOSIS — M25662 Stiffness of left knee, not elsewhere classified: Secondary | ICD-10-CM | POA: Diagnosis not present

## 2018-06-03 DIAGNOSIS — M25562 Pain in left knee: Secondary | ICD-10-CM | POA: Diagnosis not present

## 2018-06-03 DIAGNOSIS — M25662 Stiffness of left knee, not elsewhere classified: Secondary | ICD-10-CM | POA: Diagnosis not present

## 2018-06-04 ENCOUNTER — Ambulatory Visit
Admission: RE | Admit: 2018-06-04 | Discharge: 2018-06-04 | Disposition: A | Discharge status: Home / Self Care | Payer: Medicare HMO | Source: Ambulatory Visit | Attending: Oncology | Admitting: Oncology

## 2018-06-04 ENCOUNTER — Inpatient Hospital Stay: Payer: Medicare HMO | Attending: Oncology | Admitting: Oncology

## 2018-06-04 ENCOUNTER — Other Ambulatory Visit: Payer: Self-pay

## 2018-06-04 ENCOUNTER — Encounter: Payer: Self-pay | Admitting: Oncology

## 2018-06-04 ENCOUNTER — Inpatient Hospital Stay: Payer: Medicare HMO

## 2018-06-04 VITALS — BP 155/88 | HR 79 | Temp 97.8°F | Ht 73.0 in | Wt 244.4 lb

## 2018-06-04 DIAGNOSIS — Z96652 Presence of left artificial knee joint: Secondary | ICD-10-CM | POA: Diagnosis not present

## 2018-06-04 DIAGNOSIS — D472 Monoclonal gammopathy: Secondary | ICD-10-CM | POA: Insufficient documentation

## 2018-06-04 DIAGNOSIS — Z7982 Long term (current) use of aspirin: Secondary | ICD-10-CM | POA: Diagnosis not present

## 2018-06-04 DIAGNOSIS — Z794 Long term (current) use of insulin: Secondary | ICD-10-CM | POA: Diagnosis not present

## 2018-06-04 DIAGNOSIS — N183 Chronic kidney disease, stage 3 unspecified: Secondary | ICD-10-CM

## 2018-06-04 DIAGNOSIS — D631 Anemia in chronic kidney disease: Secondary | ICD-10-CM | POA: Diagnosis not present

## 2018-06-04 DIAGNOSIS — R778 Other specified abnormalities of plasma proteins: Secondary | ICD-10-CM | POA: Diagnosis not present

## 2018-06-04 DIAGNOSIS — D649 Anemia, unspecified: Secondary | ICD-10-CM

## 2018-06-04 DIAGNOSIS — Z79899 Other long term (current) drug therapy: Secondary | ICD-10-CM | POA: Insufficient documentation

## 2018-06-04 DIAGNOSIS — R69 Illness, unspecified: Secondary | ICD-10-CM | POA: Diagnosis not present

## 2018-06-04 DIAGNOSIS — E1122 Type 2 diabetes mellitus with diabetic chronic kidney disease: Secondary | ICD-10-CM | POA: Insufficient documentation

## 2018-06-04 DIAGNOSIS — B20 Human immunodeficiency virus [HIV] disease: Secondary | ICD-10-CM | POA: Diagnosis present

## 2018-06-04 DIAGNOSIS — I129 Hypertensive chronic kidney disease with stage 1 through stage 4 chronic kidney disease, or unspecified chronic kidney disease: Secondary | ICD-10-CM | POA: Diagnosis not present

## 2018-06-04 DIAGNOSIS — N400 Enlarged prostate without lower urinary tract symptoms: Secondary | ICD-10-CM | POA: Insufficient documentation

## 2018-06-04 LAB — COMPREHENSIVE METABOLIC PANEL
ALBUMIN: 4.3 g/dL (ref 3.5–5.0)
ALT: 24 U/L (ref 0–44)
ANION GAP: 9 (ref 5–15)
AST: 26 U/L (ref 15–41)
Alkaline Phosphatase: 57 U/L (ref 38–126)
BUN: 23 mg/dL (ref 8–23)
CHLORIDE: 106 mmol/L (ref 98–111)
CO2: 29 mmol/L (ref 22–32)
Calcium: 9.5 mg/dL (ref 8.9–10.3)
Creatinine, Ser: 1.66 mg/dL — ABNORMAL HIGH (ref 0.61–1.24)
GFR calc non Af Amer: 39 mL/min — ABNORMAL LOW (ref >60)
GFR, EST AFRICAN AMERICAN: 46 mL/min — AB (ref >60)
GLUCOSE: 162 mg/dL — AB (ref 70–99)
Potassium: 4.4 mmol/L (ref 3.5–5.1)
SODIUM: 144 mmol/L (ref 135–145)
Total Bilirubin: 0.5 mg/dL (ref 0.3–1.2)
Total Protein: 7.6 g/dL (ref 6.5–8.1)

## 2018-06-04 LAB — CBC WITH DIFFERENTIAL/PLATELET
ABS IMMATURE GRANULOCYTES: 0.01 10*3/uL (ref 0.00–0.07)
BASOS ABS: 0 10*3/uL (ref 0.0–0.1)
BASOS PCT: 1 %
Eosinophils Absolute: 0.2 10*3/uL (ref 0.0–0.5)
Eosinophils Relative: 5 %
HCT: 42.9 % (ref 39.0–52.0)
HEMOGLOBIN: 13.7 g/dL (ref 13.0–17.0)
Immature Granulocytes: 0 %
LYMPHS PCT: 14 %
Lymphs Abs: 0.7 10*3/uL (ref 0.7–4.0)
MCH: 26.6 pg (ref 26.0–34.0)
MCHC: 31.9 g/dL (ref 30.0–36.0)
MCV: 83.3 fL (ref 80.0–100.0)
MONO ABS: 0.5 10*3/uL (ref 0.1–1.0)
Monocytes Relative: 9 %
NEUTROS PCT: 71 %
NRBC: 0 % (ref 0.0–0.2)
Neutro Abs: 3.7 10*3/uL (ref 1.7–7.7)
PLATELETS: 209 10*3/uL (ref 150–400)
RBC: 5.15 MIL/uL (ref 4.22–5.81)
RDW: 13.7 % (ref 11.5–15.5)
WBC: 5.2 10*3/uL (ref 4.0–10.5)

## 2018-06-04 NOTE — Progress Notes (Signed)
Hematology/Oncology Consult note [facility] Telephone:([phone] [phone]   Patient Care Team: Cletis Athens, MD as PCP - General (Internal Medicine)  REFERRING PROVIDER: Dr.Lateef.  CHIEF COMPLAINTS/REASON FOR VISIT:  Evaluation of abnormal SPEP  HISTORY OF PRESENTING ILLNESS:  Keith Howe is a  78 y.o.  male with PMH listed below who was referred to me for evaluation of abnormal SPEP results.   Patient recently had work up done at nephrologist's office for CKD, stage III. Patient was found to have acute on chronic renal failure after he underwent left knee replacement. Per Dr. Holley Raring, patient was found to have abnormal SPEP as well as serum immunofixation.  MGUS.  Detail of lab results not available to me.  No aggravating or alleviated factors.  Associated signs or symptoms:  Neuropathy:  Denies weight loss, fever chills, bone pain, neuropathy.   History of HTN: Positive History of Diabetes: Positive Bone pain: Denies any bone pain  Review of Systems  Constitutional: Positive for fatigue. Negative for appetite change, chills, fever and unexpected weight change.  HENT:   Negative for hearing loss and voice change.   Eyes: Negative for eye problems and icterus.  Respiratory: Negative for chest tightness, cough and shortness of breath.   Cardiovascular: Negative for chest pain and leg swelling.  Gastrointestinal: Negative for abdominal distention and abdominal pain.  Endocrine: Negative for hot flashes.  Genitourinary: Negative for difficulty urinating, dysuria and frequency.   Musculoskeletal: Negative for arthralgias.  Skin: Negative for itching and rash.  Neurological: Negative for light-headedness and numbness.  Hematological: Negative for adenopathy. Does not bruise/bleed easily.  Psychiatric/Behavioral: Negative for confusion.    MEDICAL HISTORY:  Past Medical History:  Diagnosis Date  . BPH (benign prostatic hyperplasia)    . Diabetes mellitus without complication ([place])   . HOH (hard of hearing)    AIDS  . Hypertension   . Pain    CHRONIC LBP    SURGICAL HISTORY: Past Surgical History:  Procedure Laterality Date  . BACK SURGERY    . CATARACT EXTRACTION W/PHACO Right 07/10/2017   Procedure: CATARACT EXTRACTION PHACO AND INTRAOCULAR LENS PLACEMENT (IOC);  Surgeon: Birder Robson, MD;  Location: [facility] ORS;  Service: Ophthalmology;  Laterality: Right;  [place] 00:29.2 AP% 16.5 CDE 4.83 Fluid Pack Lot # [id number]  . COLONOSCOPY    . EYE SURGERY    . HERNIA REPAIR    . TOTAL KNEE ARTHROPLASTY Left 03/19/2018   Procedure: TOTAL KNEE ARTHROPLASTY;  Surgeon: Thornton Park, MD;  Location: [facility] ORS;  Service: Orthopedics;  Laterality: Left;    SOCIAL HISTORY: Social History   Socioeconomic History  . Marital status: Married    Spouse name: Not on file  . Number of children: Not on file  . Years of education: Not on file  . Highest education level: Not on file  Occupational History  . Not on file  Social Needs  . Financial resource strain: Not on file  . Food insecurity:    Worry: Not on file    Inability: Not on file  . Transportation needs:    Medical: Not on file    Non-medical: Not on file  Tobacco Use  . Smoking status: Never Smoker  . Smokeless tobacco: Never Used  Substance and Sexual Activity  . Alcohol use: No    Frequency: Never  . Drug use: Never  . Sexual activity: Not on file  Lifestyle  . Physical activity:    Days per week: Not on  file    Minutes per session: Not on file  . Stress: Not on file  Relationships  . Social connections:    Talks on phone: Not on file    Gets together: Not on file    Attends religious service: Not on file    Active member of club or organization: Not on file    Attends meetings of clubs or organizations: Not on file    Relationship status: Not on file  . Intimate partner violence:    Fear of current or ex partner: Not on file    Emotionally  abused: Not on file    Physically abused: Not on file    Forced sexual activity: Not on file  Other Topics Concern  . Not on file  Social History Narrative   Independent at baseline.  Lives at home with his wife    FAMILY HISTORY: Family History  Problem Relation Age of Onset  . Diabetes Father   . Breast cancer Sister     ALLERGIES:  has No Known Allergies.  MEDICATIONS:  Current Outpatient Medications  Medication Sig Dispense Refill  . aspirin EC 81 MG tablet Take 81 mg by mouth daily.    . cholecalciferol (VITAMIN D) 1000 units tablet Take 1,000 Units by mouth daily.    Marland Kitchen dutasteride (AVODART) 0.5 MG capsule Take 0.5 mg by mouth daily.  3  . ferrous sulfate 325 (65 FE) MG tablet Take 1 tablet (325 mg total) by mouth 2 (two) times daily with a meal. 60 tablet 3  . lisinopril-hydrochlorothiazide (PRINZIDE,ZESTORETIC) 20-12.5 MG tablet Take 2 tablets by mouth daily.    Marland Kitchen lovastatin (MEVACOR) 40 MG tablet Take 40 mg by mouth every morning.    . metoprolol tartrate (LOPRESSOR) 50 MG tablet Take 1 tablet (50 mg total) by mouth 2 (two) times daily. 60 tablet 0  . Misc Natural Products (PROSTATE SUPPORT PO) Take 2 capsules by mouth daily.    Marland Kitchen NOVOLOG FLEXPEN 100 UNIT/ML FlexPen Inject 7 Units into the skin 3 (three) times daily before meals.  0  . Omega-3 Fatty Acids (FISH OIL) 1000 MG CAPS Take 1,000 mg by mouth daily.     . tamsulosin (FLOMAX) 0.4 MG CAPS capsule Take 0.4 mg by mouth daily after breakfast.     . TRESIBA FLEXTOUCH 200 UNIT/ML SOPN Inject 80 Units into the muscle every morning.  2  . TRULICITY 1.5 LD/3.5TS SOPN Inject 1.5 mg into the muscle every Wednesday.   1  . oxyCODONE (OXY IR/ROXICODONE) 5 MG immediate release tablet Take 1 tablet (5 mg total) by mouth every 4 (four) hours as needed for moderate pain (pain score 4-6). (Patient not taking: Reported on 06/04/2018) 40 tablet 0   No current facility-administered medications for this visit.      PHYSICAL  EXAMINATION: ECOG PERFORMANCE STATUS: 0 - Asymptomatic Vitals:   06/04/18 1111  BP: (!) 155/88  Pulse: 79  Temp: 97.8 F (36.6 C)  SpO2: (!) 18%   Filed Weights   06/04/18 1111  Weight: 244 lb 6.4 oz (110.9 kg)    Physical Exam Constitutional:      General: He is not in acute distress. HENT:     Head: Normocephalic and atraumatic.  Eyes:     General: No scleral icterus.    Pupils: Pupils are equal, round, and reactive to light.  Neck:     Musculoskeletal: Normal range of motion and neck supple.  Cardiovascular:     Rate and Rhythm:  Normal rate and regular rhythm.     Heart sounds: Normal heart sounds.  Pulmonary:     Effort: Pulmonary effort is normal. No respiratory distress.     Breath sounds: No wheezing.  Abdominal:     General: Bowel sounds are normal. There is no distension.     Palpations: Abdomen is soft. There is no mass.     Tenderness: There is no abdominal tenderness.  Musculoskeletal: Normal range of motion.        General: No deformity.  Skin:    General: Skin is warm and dry.     Findings: No erythema or rash.  Neurological:     Mental Status: He is alert and oriented to person, place, and time.     Cranial Nerves: No cranial nerve deficit.     Coordination: Coordination normal.  Psychiatric:        Behavior: Behavior normal.        Thought Content: Thought content normal.      LABORATORY DATA:  I have reviewed the data as listed Lab Results  Component Value Date   WBC 5.2 06/04/2018   HGB 13.7 06/04/2018   HCT 42.9 06/04/2018   MCV 83.3 06/04/2018   PLT 209 06/04/2018   Recent Labs    03/23/18 0450 03/24/18 0443 06/04/18 1144  NA 132* 133* 144  K 4.8 4.8 4.4  CL 99 102 106  CO2 _0 GLUCOSE 161* 110* 162*  BUN 58* 53* 23  CREATININE 2.44* 2.13* 1.66*  CALCIUM 7.9* 7.8* 9.5  GFRNONAA 24* 28* 39*  GFRAA 28* 33* 46*  PROT  --   --  7.6  ALBUMIN  --   --  4.3  AST  --   --  26  ALT  --   --  24  ALKPHOS  --   --  57    BILITOT  --   --  0.5   Iron/TIBC/Ferritin/ %Sat    Component Value Date/Time   IRON 10 (L) 03/22/2018 0420   TIBC 231 (L) 03/22/2018 0420   FERRITIN 120 03/22/2018 0420   IRONPCTSAT 4 (L) 03/22/2018 0420     RADIOGRAPHIC STUDIES: I have personally reviewed the radiological images as listed and agreed with the findings in the report. 06/04/2018 skeletal survey showed no acute bony abnormality or focal lytic lesion.  ASSESSMENT & PLAN:  1. Abnormal SPEP   2. Stage 3 chronic kidney disease ([place])   3. Anemia, unspecified type    Labs are reviewed and discussed with patient.  I recommend checking multiple myeloma panel [SPEP, IFE, light chain ratio], UPEP, UFE.  Obtain skeletal survery  Orders Placed This Encounter  Procedures  . DG Bone Survey Met    Standing Status:   Future    Number of Occurrences:   1    Standing Expiration Date:   08/02/2019    Order Specific Question:   Reason for Exam (SYMPTOM  OR DIAGNOSIS REQUIRED)    Answer:   abnormal SPEP MGUS    Order Specific Question:   Preferred imaging location?    Answer:   [facility]  . CBC with Differential/Platelet    Standing Status:   Future    Number of Occurrences:   1    Standing Expiration Date:   06/05/2019  . Comprehensive metabolic panel    Standing Status:   Future    Number of Occurrences:   1    Standing Expiration Date:  06/05/2019  . Multiple Myeloma Panel (SPEP&IFE w/QIG)    Standing Status:   Future    Number of Occurrences:   1    Standing Expiration Date:   06/04/2019  . Kappa/lambda light chains    Standing Status:   Future    Number of Occurrences:   1    Standing Expiration Date:   06/04/2019  . 24 hr Ur UPEP/UIFE/Light Chains/TP    Standing Status:   Future    Standing Expiration Date:   06/05/2019    All questions were answered. The patient knows to call the clinic with any problems questions or concerns.  Return of visit: 2 weeks Thank you for this kind referral and the opportunity  to participate in the care of this patient. A copy of today's note is routed to referring provider  Total face to face encounter time for this patient visit was 45 min. >50% of the time was  spent in counseling and coordination of care.    Earlie Server, MD, PhD Hematology Oncology [facility] Pager- [phone] 06/04/2018

## 2018-06-04 NOTE — Progress Notes (Signed)
Patient her for initial visit. Pt states he feels "good."

## 2018-06-05 LAB — KAPPA/LAMBDA LIGHT CHAINS
KAPPA, LAMDA LIGHT CHAIN RATIO: 1.49 (ref 0.26–1.65)
Kappa free light chain: 39 mg/L — ABNORMAL HIGH (ref 3.3–19.4)
LAMDA FREE LIGHT CHAINS: 26.1 mg/L (ref 5.7–26.3)

## 2018-06-06 LAB — MULTIPLE MYELOMA PANEL, SERUM
ALBUMIN/GLOB SERPL: 1.5 (ref 0.7–1.7)
ALPHA 1: 0.2 g/dL (ref 0.0–0.4)
ALPHA2 GLOB SERPL ELPH-MCNC: 0.7 g/dL (ref 0.4–1.0)
Albumin SerPl Elph-Mcnc: 4 g/dL (ref 2.9–4.4)
B-Globulin SerPl Elph-Mcnc: 0.8 g/dL (ref 0.7–1.3)
GAMMA GLOB SERPL ELPH-MCNC: 1 g/dL (ref 0.4–1.8)
GLOBULIN, TOTAL: 2.8 g/dL (ref 2.2–3.9)
IGG (IMMUNOGLOBIN G), SERUM: 1154 mg/dL (ref 700–1600)
IgA: 222 mg/dL (ref 61–437)
IgM (Immunoglobulin M), Srm: 97 mg/dL (ref 15–143)
Total Protein ELP: 6.8 g/dL (ref 6.0–8.5)

## 2018-06-07 ENCOUNTER — Other Ambulatory Visit: Payer: Self-pay

## 2018-06-07 DIAGNOSIS — D472 Monoclonal gammopathy: Secondary | ICD-10-CM | POA: Diagnosis not present

## 2018-06-07 DIAGNOSIS — N400 Enlarged prostate without lower urinary tract symptoms: Secondary | ICD-10-CM | POA: Diagnosis not present

## 2018-06-07 DIAGNOSIS — Z7982 Long term (current) use of aspirin: Secondary | ICD-10-CM | POA: Diagnosis not present

## 2018-06-07 DIAGNOSIS — I129 Hypertensive chronic kidney disease with stage 1 through stage 4 chronic kidney disease, or unspecified chronic kidney disease: Secondary | ICD-10-CM | POA: Diagnosis not present

## 2018-06-07 DIAGNOSIS — R778 Other specified abnormalities of plasma proteins: Secondary | ICD-10-CM

## 2018-06-07 DIAGNOSIS — Z96652 Presence of left artificial knee joint: Secondary | ICD-10-CM | POA: Diagnosis not present

## 2018-06-07 DIAGNOSIS — R69 Illness, unspecified: Secondary | ICD-10-CM | POA: Diagnosis not present

## 2018-06-07 DIAGNOSIS — E1122 Type 2 diabetes mellitus with diabetic chronic kidney disease: Secondary | ICD-10-CM | POA: Diagnosis not present

## 2018-06-07 DIAGNOSIS — D631 Anemia in chronic kidney disease: Secondary | ICD-10-CM | POA: Diagnosis not present

## 2018-06-07 DIAGNOSIS — N183 Chronic kidney disease, stage 3 (moderate): Secondary | ICD-10-CM | POA: Diagnosis not present

## 2018-06-07 DIAGNOSIS — B20 Human immunodeficiency virus [HIV] disease: Secondary | ICD-10-CM | POA: Diagnosis not present

## 2018-06-07 DIAGNOSIS — Z794 Long term (current) use of insulin: Secondary | ICD-10-CM | POA: Diagnosis not present

## 2018-06-09 DIAGNOSIS — R69 Illness, unspecified: Secondary | ICD-10-CM | POA: Diagnosis not present

## 2018-06-10 DIAGNOSIS — M25662 Stiffness of left knee, not elsewhere classified: Secondary | ICD-10-CM | POA: Diagnosis not present

## 2018-06-10 DIAGNOSIS — M25562 Pain in left knee: Secondary | ICD-10-CM | POA: Diagnosis not present

## 2018-06-10 LAB — UPEP/UIFE/LIGHT CHAINS/TP, 24-HR UR
% BETA, URINE: 0 %
ALPHA 1 URINE: 0 %
Albumin, U: 0 %
Alpha 2, Urine: 0 %
FREE KAPPA LT CHAINS, UR: 46.2 mg/L (ref 0.63–113.79)
FREE KAPPA/LAMBDA RATIO: 15.1 (ref 1.03–31.76)
FREE LAMBDA LT CHAINS, UR: 3.06 mg/L (ref 0.47–11.77)
GAMMA GLOBULIN URINE: 0 %
TOTAL PROTEIN, URINE-UPE24: 4 mg/dL
TOTAL VOLUME: 1750
Total Protein, Urine-Ur/day: 70 mg/24 hr (ref 30–150)

## 2018-06-18 ENCOUNTER — Inpatient Hospital Stay: Payer: Medicare HMO | Attending: Oncology

## 2018-06-18 ENCOUNTER — Inpatient Hospital Stay: Payer: Medicare HMO | Admitting: Oncology

## 2018-06-18 VITALS — BP 169/87 | HR 76 | Temp 97.5°F | Wt 249.0 lb

## 2018-06-18 DIAGNOSIS — I7389 Other specified peripheral vascular diseases: Secondary | ICD-10-CM | POA: Diagnosis not present

## 2018-06-18 DIAGNOSIS — N183 Chronic kidney disease, stage 3 unspecified: Secondary | ICD-10-CM

## 2018-06-18 DIAGNOSIS — Z79899 Other long term (current) drug therapy: Secondary | ICD-10-CM

## 2018-06-18 DIAGNOSIS — B20 Human immunodeficiency virus [HIV] disease: Secondary | ICD-10-CM

## 2018-06-18 DIAGNOSIS — N400 Enlarged prostate without lower urinary tract symptoms: Secondary | ICD-10-CM | POA: Diagnosis not present

## 2018-06-18 DIAGNOSIS — E1122 Type 2 diabetes mellitus with diabetic chronic kidney disease: Secondary | ICD-10-CM | POA: Insufficient documentation

## 2018-06-18 DIAGNOSIS — Z96652 Presence of left artificial knee joint: Secondary | ICD-10-CM | POA: Insufficient documentation

## 2018-06-18 DIAGNOSIS — E1065 Type 1 diabetes mellitus with hyperglycemia: Secondary | ICD-10-CM | POA: Diagnosis not present

## 2018-06-18 DIAGNOSIS — I129 Hypertensive chronic kidney disease with stage 1 through stage 4 chronic kidney disease, or unspecified chronic kidney disease: Secondary | ICD-10-CM | POA: Insufficient documentation

## 2018-06-18 DIAGNOSIS — D472 Monoclonal gammopathy: Secondary | ICD-10-CM | POA: Insufficient documentation

## 2018-06-18 DIAGNOSIS — Z794 Long term (current) use of insulin: Secondary | ICD-10-CM | POA: Insufficient documentation

## 2018-06-18 DIAGNOSIS — R778 Other specified abnormalities of plasma proteins: Secondary | ICD-10-CM

## 2018-06-18 DIAGNOSIS — I10 Essential (primary) hypertension: Secondary | ICD-10-CM | POA: Diagnosis not present

## 2018-06-18 DIAGNOSIS — Z7982 Long term (current) use of aspirin: Secondary | ICD-10-CM | POA: Insufficient documentation

## 2018-06-18 DIAGNOSIS — R69 Illness, unspecified: Secondary | ICD-10-CM | POA: Diagnosis not present

## 2018-06-18 DIAGNOSIS — E669 Obesity, unspecified: Secondary | ICD-10-CM | POA: Diagnosis not present

## 2018-06-18 DIAGNOSIS — E785 Hyperlipidemia, unspecified: Secondary | ICD-10-CM | POA: Diagnosis not present

## 2018-06-18 DIAGNOSIS — E790 Hyperuricemia without signs of inflammatory arthritis and tophaceous disease: Secondary | ICD-10-CM | POA: Diagnosis not present

## 2018-06-19 ENCOUNTER — Encounter: Payer: Self-pay | Admitting: Oncology

## 2018-06-19 NOTE — Progress Notes (Signed)
Hematology/Oncology Consult note [facility] [phone] [phone]   Patient Care Team: Cletis Athens, MD as PCP - General (Internal Medicine)  REFERRING PROVIDER: Dr.Lateef.  CHIEF COMPLAINTS/REASON FOR VISIT:  Evaluation of abnormal SPEP  HISTORY OF PRESENTING ILLNESS:  Keith Howe is a  78 y.o.  male with PMH listed below who was referred to me for evaluation of abnormal SPEP results.   Patient recently had work up done at nephrologist's office for CKD, stage III. Patient was found to have acute on chronic renal failure after he underwent left knee replacement. Per Dr. Holley Raring, patient was found to have abnormal SPEP as well as serum immunofixation.  MGUS.  Detail of lab results not available to me.  No aggravating or alleviated factors.  Associated signs or symptoms:  Neuropathy:  Denies weight loss, fever chills, bone pain, neuropathy.   History of HTN: Positive History of Diabetes: Positive Bone pain: Denies any bone pain  INTERVAL HISTORY Keith Howe is a 78 y.o. male who has above history reviewed by me today presents for follow up visit for management of abnormal SPEP. In the interval patient has had lab work-up done and present to discuss Results. Today he has no new complaints.  Chronic fatigue at baseline   Review of Systems  Constitutional: Positive for fatigue. Negative for appetite change, chills, fever and unexpected weight change.  HENT:   Negative for hearing loss and voice change.   Eyes: Negative for eye problems and icterus.  Respiratory: Negative for chest tightness, cough and shortness of breath.   Cardiovascular: Negative for chest pain and leg swelling.  Gastrointestinal: Negative for abdominal distention, abdominal pain and blood in stool.  Endocrine: Negative for hot flashes.  Genitourinary: Negative for difficulty urinating, dysuria and frequency.   Musculoskeletal: Negative for arthralgias.    Skin: Negative for itching and rash.  Neurological: Negative for extremity weakness, light-headedness and numbness.  Hematological: Negative for adenopathy. Does not bruise/bleed easily.  Psychiatric/Behavioral: Negative for confusion.    MEDICAL HISTORY:  Past Medical History:  Diagnosis Date  . BPH (benign prostatic hyperplasia)   . Diabetes mellitus without complication ([place])   . HOH (hard of hearing)    AIDS  . Hypertension   . Pain    CHRONIC LBP    SURGICAL HISTORY: Past Surgical History:  Procedure Laterality Date  . BACK SURGERY    . CATARACT EXTRACTION W/PHACO Right 07/10/2017   Procedure: CATARACT EXTRACTION PHACO AND INTRAOCULAR LENS PLACEMENT (IOC);  Surgeon: Birder Robson, MD;  Location: [facility] ORS;  Service: Ophthalmology;  Laterality: Right;  [place] 00:29.2 AP% 16.5 CDE 4.83 Fluid Pack Lot # [id number]  . COLONOSCOPY    . EYE SURGERY    . HERNIA REPAIR    . TOTAL KNEE ARTHROPLASTY Left 03/19/2018   Procedure: TOTAL KNEE ARTHROPLASTY;  Surgeon: Thornton Park, MD;  Location: [facility] ORS;  Service: Orthopedics;  Laterality: Left;    SOCIAL HISTORY: Social History   Socioeconomic History  . Marital status: Married    Spouse name: Not on file  . Number of children: Not on file  . Years of education: Not on file  . Highest education level: Not on file  Occupational History  . Not on file  Social Needs  . Financial resource strain: Not on file  . Food insecurity:    Worry: Not on file    Inability: Not on file  . Transportation needs:    Medical: Not on file  Non-medical: Not on file  Tobacco Use  . Smoking status: Never Smoker  . Smokeless tobacco: Never Used  Substance and Sexual Activity  . Alcohol use: No    Frequency: Never  . Drug use: Never  . Sexual activity: Not on file  Lifestyle  . Physical activity:    Days per week: Not on file    Minutes per session: Not on file  . Stress: Not on file  Relationships  . Social connections:     Talks on phone: Not on file    Gets together: Not on file    Attends religious service: Not on file    Active member of club or organization: Not on file    Attends meetings of clubs or organizations: Not on file    Relationship status: Not on file  . Intimate partner violence:    Fear of current or ex partner: Not on file    Emotionally abused: Not on file    Physically abused: Not on file    Forced sexual activity: Not on file  Other Topics Concern  . Not on file  Social History Narrative   Independent at baseline.  Lives at home with his wife    FAMILY HISTORY: Family History  Problem Relation Age of Onset  . Diabetes Father   . Breast cancer Sister     ALLERGIES:  has No Known Allergies.  MEDICATIONS:  Current Outpatient Medications  Medication Sig Dispense Refill  . aspirin EC 81 MG tablet Take 81 mg by mouth daily.    . cholecalciferol (VITAMIN D) 1000 units tablet Take 1,000 Units by mouth daily.    Marland Kitchen dutasteride (AVODART) 0.5 MG capsule Take 0.5 mg by mouth daily.  3  . ferrous sulfate 325 (65 FE) MG tablet Take 1 tablet (325 mg total) by mouth 2 (two) times daily with a meal. 60 tablet 3  . lisinopril-hydrochlorothiazide (PRINZIDE,ZESTORETIC) 20-12.5 MG tablet Take 2 tablets by mouth daily.    Marland Kitchen lovastatin (MEVACOR) 40 MG tablet Take 40 mg by mouth every morning.    . metoprolol tartrate (LOPRESSOR) 50 MG tablet Take 1 tablet (50 mg total) by mouth 2 (two) times daily. 60 tablet 0  . Misc Natural Products (PROSTATE SUPPORT PO) Take 2 capsules by mouth daily.    Marland Kitchen NOVOLOG FLEXPEN 100 UNIT/ML FlexPen Inject 7 Units into the skin 3 (three) times daily before meals.  0  . Omega-3 Fatty Acids (FISH OIL) 1000 MG CAPS Take 1,000 mg by mouth daily.     Marland Kitchen oxyCODONE (OXY IR/ROXICODONE) 5 MG immediate release tablet Take 1 tablet (5 mg total) by mouth every 4 (four) hours as needed for moderate pain (pain score 4-6). (Patient not taking: Reported on 06/04/2018) 40 tablet 0  .  tamsulosin (FLOMAX) 0.4 MG CAPS capsule Take 0.4 mg by mouth daily after breakfast.     . TRESIBA FLEXTOUCH 200 UNIT/ML SOPN Inject 80 Units into the muscle every morning.  2  . TRULICITY 1.5 DJ/4.9FW SOPN Inject 1.5 mg into the muscle every Wednesday.   1   No current facility-administered medications for this visit.      PHYSICAL EXAMINATION: ECOG PERFORMANCE STATUS: 0 - Asymptomatic Vitals:   06/19/18 1223  BP: (!) 169/87  Pulse: 76  Temp: (!) 97.5 F (36.4 C)   Filed Weights   06/19/18 1223  Weight: 249 lb (112.9 kg)    Physical Exam Constitutional:      General: He is not in acute  distress. HENT:     Head: Normocephalic and atraumatic.  Eyes:     General: No scleral icterus.    Pupils: Pupils are equal, round, and reactive to light.  Neck:     Musculoskeletal: Normal range of motion and neck supple.  Cardiovascular:     Rate and Rhythm: Normal rate and regular rhythm.     Heart sounds: Normal heart sounds.  Pulmonary:     Effort: Pulmonary effort is normal. No respiratory distress.     Breath sounds: No wheezing.  Abdominal:     General: Bowel sounds are normal. There is no distension.     Palpations: Abdomen is soft. There is no mass.     Tenderness: There is no abdominal tenderness.  Musculoskeletal: Normal range of motion.        General: No deformity.  Skin:    General: Skin is warm and dry.     Findings: No erythema or rash.  Neurological:     Mental Status: He is alert and oriented to person, place, and time.     Cranial Nerves: No cranial nerve deficit.     Coordination: Coordination normal.  Psychiatric:        Behavior: Behavior normal.        Thought Content: Thought content normal.      LABORATORY DATA:  I have reviewed the data as listed Lab Results  Component Value Date   WBC 5.2 06/04/2018   HGB 13.7 06/04/2018   HCT 42.9 06/04/2018   MCV 83.3 06/04/2018   PLT 209 06/04/2018   Recent Labs    03/23/18 0450 03/24/18 0443  06/04/18 1144  NA 132* 133* 144  K 4.8 4.8 4.4  CL 99 102 106  CO2 '26 26 29  '$ GLUCOSE 161* 110* 162*  BUN 58* 53* 23  CREATININE 2.44* 2.13* 1.66*  CALCIUM 7.9* 7.8* 9.5  GFRNONAA 24* 28* 39*  GFRAA 28* 33* 46*  PROT  --   --  7.6  ALBUMIN  --   --  4.3  AST  --   --  26  ALT  --   --  24  ALKPHOS  --   --  57  BILITOT  --   --  0.5   Iron/TIBC/Ferritin/ %Sat    Component Value Date/Time   IRON 10 (L) 03/22/2018 0420   TIBC 231 (L) 03/22/2018 0420   FERRITIN 120 03/22/2018 0420   IRONPCTSAT 4 (L) 03/22/2018 0420     RADIOGRAPHIC STUDIES: I have personally reviewed the radiological images as listed and agreed with the findings in the report. 06/04/2018 skeletal survey showed no acute bony abnormality or focal lytic lesion.  ASSESSMENT & PLAN:  1. Abnormal SPEP   2. Stage 3 chronic kidney disease ([place])   Labs reviewed and discussed with patient. Multiple myeloma panel did not show monoclonal protein.  Normal immunofixation pattern. Free kappa lambda chain ratio normal at 1.49.  Slightly elevated kappa free light chain which can be related to chronic kidney disease. 24-hour urine protein electrophoresis, immunofixation's, light chain showed no urine protein electrophoretic pattern was observed.  Normal immunofixation.  No monoclonal protein.  Skeletal survey negative for lesions.  Discussed with CBC showed hemoglobin of 13.7.  Anemia has completely resolved.   Discussed with patient that work-up are negative for plasma cell dyscrasia.  Currently his hemoglobin is 13.7. He can follow-up as needed in the clinic in the future if he needs to have erythropoietin treatments when hemoglobin drops  below 10.   All questions were answered. The patient knows to call the clinic with any problems questions or concerns.  Return of visit: As needed.   CC Dr. Holley Raring.  Earlie Server, MD, PhD Hematology Oncology [facility] Pager- [phone] 06/19/2018

## 2018-07-02 DIAGNOSIS — E790 Hyperuricemia without signs of inflammatory arthritis and tophaceous disease: Secondary | ICD-10-CM | POA: Diagnosis not present

## 2018-07-02 DIAGNOSIS — R69 Illness, unspecified: Secondary | ICD-10-CM | POA: Diagnosis not present

## 2018-07-02 DIAGNOSIS — I7389 Other specified peripheral vascular diseases: Secondary | ICD-10-CM | POA: Diagnosis not present

## 2018-07-02 DIAGNOSIS — E785 Hyperlipidemia, unspecified: Secondary | ICD-10-CM | POA: Diagnosis not present

## 2018-07-02 DIAGNOSIS — E669 Obesity, unspecified: Secondary | ICD-10-CM | POA: Diagnosis not present

## 2018-07-02 DIAGNOSIS — N401 Enlarged prostate with lower urinary tract symptoms: Secondary | ICD-10-CM | POA: Diagnosis not present

## 2018-07-02 DIAGNOSIS — N184 Chronic kidney disease, stage 4 (severe): Secondary | ICD-10-CM | POA: Diagnosis not present

## 2018-07-02 DIAGNOSIS — E1065 Type 1 diabetes mellitus with hyperglycemia: Secondary | ICD-10-CM | POA: Diagnosis not present

## 2018-07-02 DIAGNOSIS — I10 Essential (primary) hypertension: Secondary | ICD-10-CM | POA: Diagnosis not present

## 2018-07-10 DIAGNOSIS — I10 Essential (primary) hypertension: Secondary | ICD-10-CM | POA: Diagnosis not present

## 2018-07-10 DIAGNOSIS — D631 Anemia in chronic kidney disease: Secondary | ICD-10-CM | POA: Diagnosis not present

## 2018-07-10 DIAGNOSIS — N183 Chronic kidney disease, stage 3 (moderate): Secondary | ICD-10-CM | POA: Diagnosis not present

## 2018-07-10 DIAGNOSIS — R809 Proteinuria, unspecified: Secondary | ICD-10-CM | POA: Diagnosis not present

## 2018-07-16 DIAGNOSIS — E8881 Metabolic syndrome: Secondary | ICD-10-CM | POA: Diagnosis not present

## 2018-07-16 DIAGNOSIS — E119 Type 2 diabetes mellitus without complications: Secondary | ICD-10-CM | POA: Diagnosis not present

## 2018-07-16 DIAGNOSIS — I080 Rheumatic disorders of both mitral and aortic valves: Secondary | ICD-10-CM | POA: Diagnosis not present

## 2018-07-16 DIAGNOSIS — E118 Type 2 diabetes mellitus with unspecified complications: Secondary | ICD-10-CM | POA: Diagnosis not present

## 2018-08-16 DIAGNOSIS — R69 Illness, unspecified: Secondary | ICD-10-CM | POA: Diagnosis not present

## 2018-09-09 DIAGNOSIS — M25562 Pain in left knee: Secondary | ICD-10-CM | POA: Diagnosis not present

## 2018-10-01 DIAGNOSIS — E1065 Type 1 diabetes mellitus with hyperglycemia: Secondary | ICD-10-CM | POA: Diagnosis not present

## 2018-10-01 DIAGNOSIS — E785 Hyperlipidemia, unspecified: Secondary | ICD-10-CM | POA: Diagnosis not present

## 2018-10-01 DIAGNOSIS — N184 Chronic kidney disease, stage 4 (severe): Secondary | ICD-10-CM | POA: Diagnosis not present

## 2018-10-01 DIAGNOSIS — I7389 Other specified peripheral vascular diseases: Secondary | ICD-10-CM | POA: Diagnosis not present

## 2018-10-01 DIAGNOSIS — N401 Enlarged prostate with lower urinary tract symptoms: Secondary | ICD-10-CM | POA: Diagnosis not present

## 2018-10-01 DIAGNOSIS — I10 Essential (primary) hypertension: Secondary | ICD-10-CM | POA: Diagnosis not present

## 2018-10-01 DIAGNOSIS — E669 Obesity, unspecified: Secondary | ICD-10-CM | POA: Diagnosis not present

## 2018-10-01 DIAGNOSIS — Z6833 Body mass index (BMI) 33.0-33.9, adult: Secondary | ICD-10-CM | POA: Diagnosis not present

## 2018-10-01 DIAGNOSIS — E790 Hyperuricemia without signs of inflammatory arthritis and tophaceous disease: Secondary | ICD-10-CM | POA: Diagnosis not present

## 2018-10-01 DIAGNOSIS — E10649 Type 1 diabetes mellitus with hypoglycemia without coma: Secondary | ICD-10-CM | POA: Diagnosis not present

## 2018-10-14 DIAGNOSIS — E118 Type 2 diabetes mellitus with unspecified complications: Secondary | ICD-10-CM | POA: Diagnosis not present

## 2018-10-14 DIAGNOSIS — R200 Anesthesia of skin: Secondary | ICD-10-CM | POA: Diagnosis not present

## 2018-10-14 DIAGNOSIS — R011 Cardiac murmur, unspecified: Secondary | ICD-10-CM | POA: Diagnosis not present

## 2018-10-14 DIAGNOSIS — E8881 Metabolic syndrome: Secondary | ICD-10-CM | POA: Diagnosis not present

## 2018-10-23 DIAGNOSIS — R69 Illness, unspecified: Secondary | ICD-10-CM | POA: Diagnosis not present

## 2018-11-14 DIAGNOSIS — R809 Proteinuria, unspecified: Secondary | ICD-10-CM | POA: Diagnosis not present

## 2018-11-14 DIAGNOSIS — I10 Essential (primary) hypertension: Secondary | ICD-10-CM | POA: Diagnosis not present

## 2018-11-14 DIAGNOSIS — D631 Anemia in chronic kidney disease: Secondary | ICD-10-CM | POA: Diagnosis not present

## 2018-11-14 DIAGNOSIS — N183 Chronic kidney disease, stage 3 (moderate): Secondary | ICD-10-CM | POA: Diagnosis not present

## 2018-11-15 DIAGNOSIS — R69 Illness, unspecified: Secondary | ICD-10-CM | POA: Diagnosis not present

## 2019-01-02 DIAGNOSIS — E1065 Type 1 diabetes mellitus with hyperglycemia: Secondary | ICD-10-CM | POA: Diagnosis not present

## 2019-01-02 DIAGNOSIS — E790 Hyperuricemia without signs of inflammatory arthritis and tophaceous disease: Secondary | ICD-10-CM | POA: Diagnosis not present

## 2019-01-02 DIAGNOSIS — I7389 Other specified peripheral vascular diseases: Secondary | ICD-10-CM | POA: Diagnosis not present

## 2019-01-02 DIAGNOSIS — N184 Chronic kidney disease, stage 4 (severe): Secondary | ICD-10-CM | POA: Diagnosis not present

## 2019-01-02 DIAGNOSIS — E10649 Type 1 diabetes mellitus with hypoglycemia without coma: Secondary | ICD-10-CM | POA: Diagnosis not present

## 2019-01-02 DIAGNOSIS — E785 Hyperlipidemia, unspecified: Secondary | ICD-10-CM | POA: Diagnosis not present

## 2019-01-02 DIAGNOSIS — I10 Essential (primary) hypertension: Secondary | ICD-10-CM | POA: Diagnosis not present

## 2019-01-02 DIAGNOSIS — N401 Enlarged prostate with lower urinary tract symptoms: Secondary | ICD-10-CM | POA: Diagnosis not present

## 2019-01-09 DIAGNOSIS — E1065 Type 1 diabetes mellitus with hyperglycemia: Secondary | ICD-10-CM | POA: Diagnosis not present

## 2019-01-09 DIAGNOSIS — Z23 Encounter for immunization: Secondary | ICD-10-CM | POA: Diagnosis not present

## 2019-01-09 DIAGNOSIS — E785 Hyperlipidemia, unspecified: Secondary | ICD-10-CM | POA: Diagnosis not present

## 2019-01-09 DIAGNOSIS — I10 Essential (primary) hypertension: Secondary | ICD-10-CM | POA: Diagnosis not present

## 2019-01-09 DIAGNOSIS — E10649 Type 1 diabetes mellitus with hypoglycemia without coma: Secondary | ICD-10-CM | POA: Diagnosis not present

## 2019-01-09 DIAGNOSIS — E669 Obesity, unspecified: Secondary | ICD-10-CM | POA: Diagnosis not present

## 2019-01-09 DIAGNOSIS — I7389 Other specified peripheral vascular diseases: Secondary | ICD-10-CM | POA: Diagnosis not present

## 2019-01-09 DIAGNOSIS — N184 Chronic kidney disease, stage 4 (severe): Secondary | ICD-10-CM | POA: Diagnosis not present

## 2019-01-09 DIAGNOSIS — E790 Hyperuricemia without signs of inflammatory arthritis and tophaceous disease: Secondary | ICD-10-CM | POA: Diagnosis not present

## 2019-01-09 DIAGNOSIS — N401 Enlarged prostate with lower urinary tract symptoms: Secondary | ICD-10-CM | POA: Diagnosis not present

## 2019-01-14 DIAGNOSIS — E8881 Metabolic syndrome: Secondary | ICD-10-CM | POA: Diagnosis not present

## 2019-01-14 DIAGNOSIS — I080 Rheumatic disorders of both mitral and aortic valves: Secondary | ICD-10-CM | POA: Diagnosis not present

## 2019-01-14 DIAGNOSIS — E118 Type 2 diabetes mellitus with unspecified complications: Secondary | ICD-10-CM | POA: Diagnosis not present

## 2019-01-14 DIAGNOSIS — R011 Cardiac murmur, unspecified: Secondary | ICD-10-CM | POA: Diagnosis not present

## 2019-01-22 DIAGNOSIS — R69 Illness, unspecified: Secondary | ICD-10-CM | POA: Diagnosis not present

## 2019-01-27 IMAGING — US US EXTREM LOW VENOUS*L*
1 series · 14 of 24 positions shown · non-contrast
Comparison: None.

CLINICAL DATA: Calf swelling for 2 months

EXAM:
LEFT LOWER EXTREMITY VENOUS DUPLEX ULTRASOUND
TECHNIQUE: Doppler venous assessment of the left lower extremity deep venous
system was performed, including characterization of spectral flow,
compressibility, and phasicity.

[Series 1: us extrem low venous*left* · 0.11mm/px · 14 of 33 slices shown]
[im 1/33]
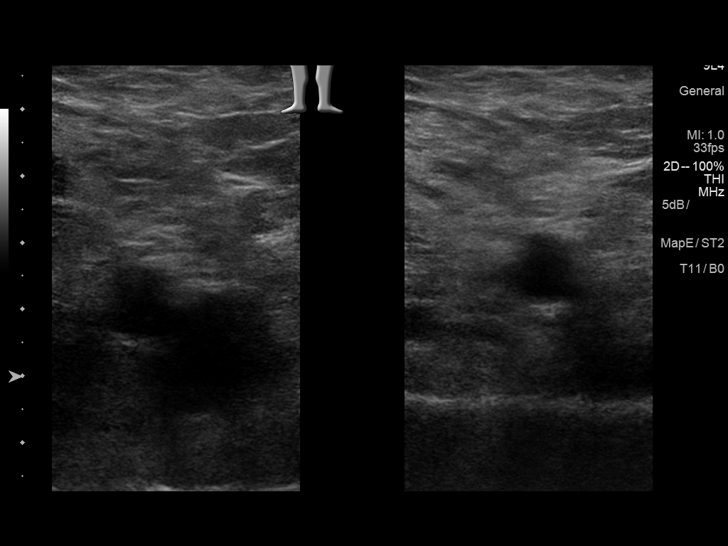
[im 3/33]
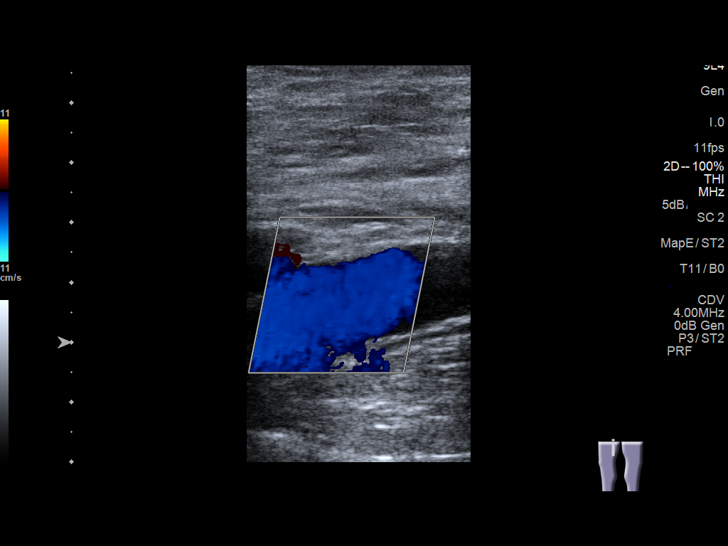
[im 6/33]
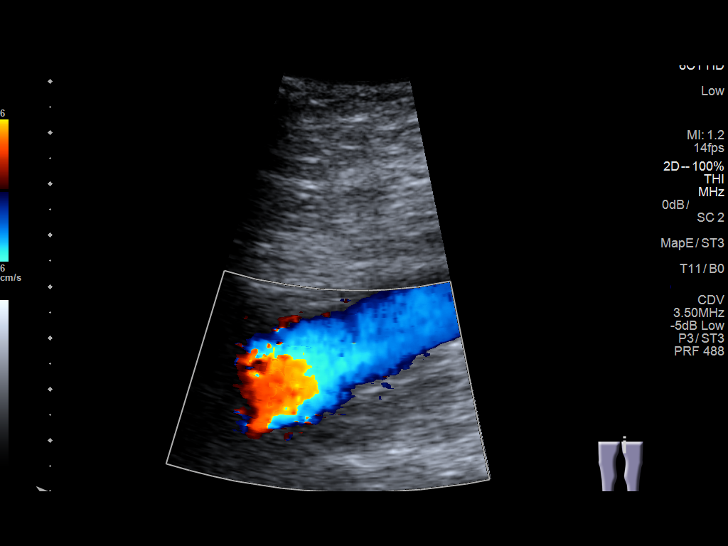
[im 9/33]
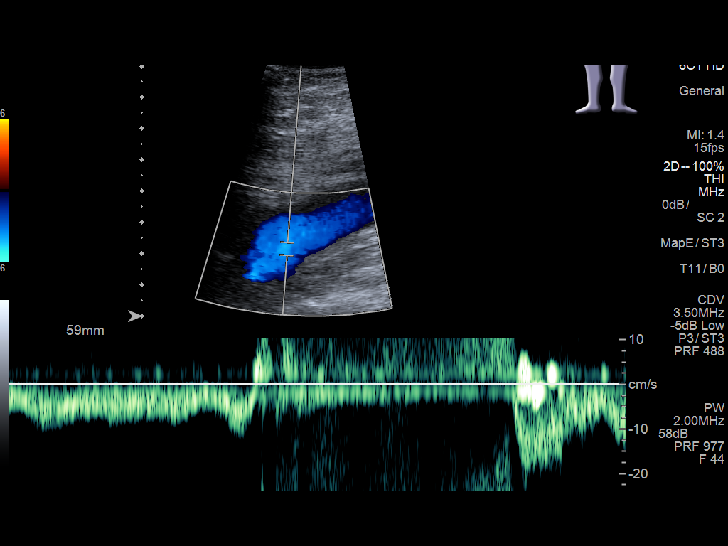
[im 10/33]
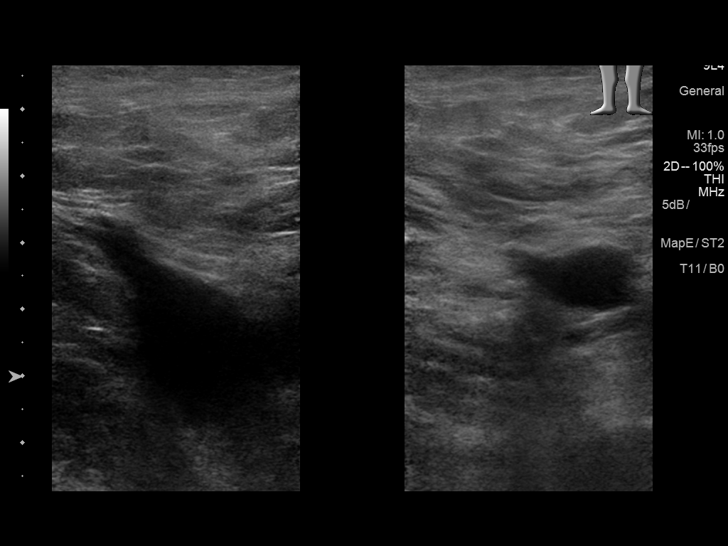
[im 13/33]
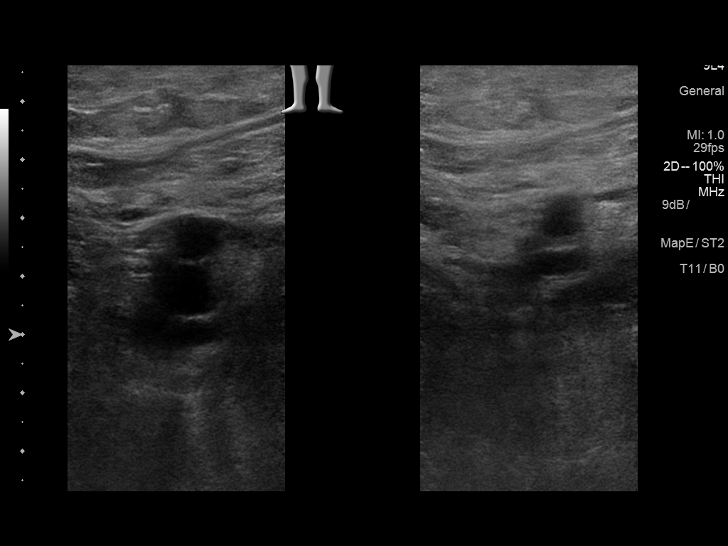
[im 16/33]
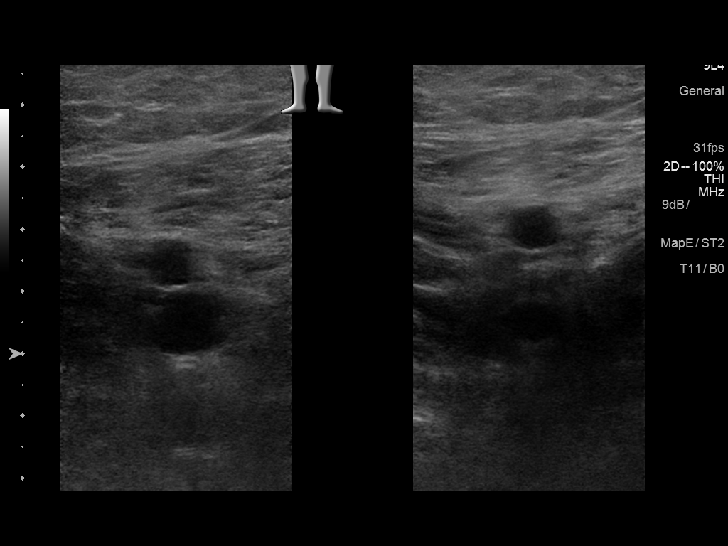
[im 17/33]
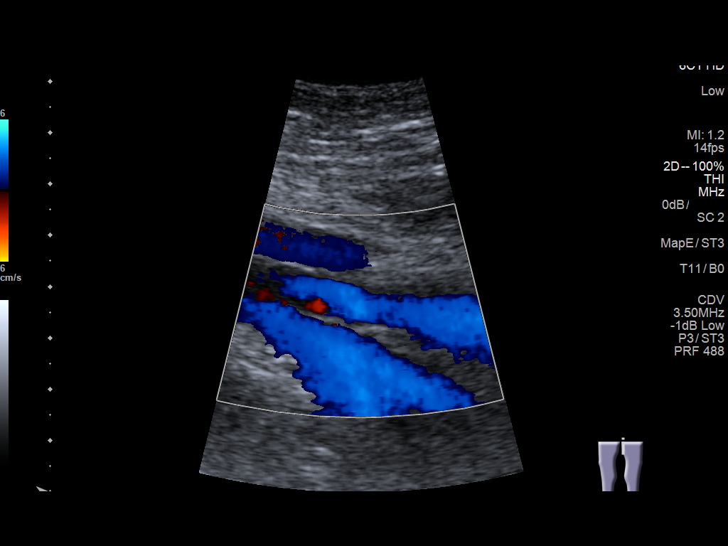
[im 20/33]
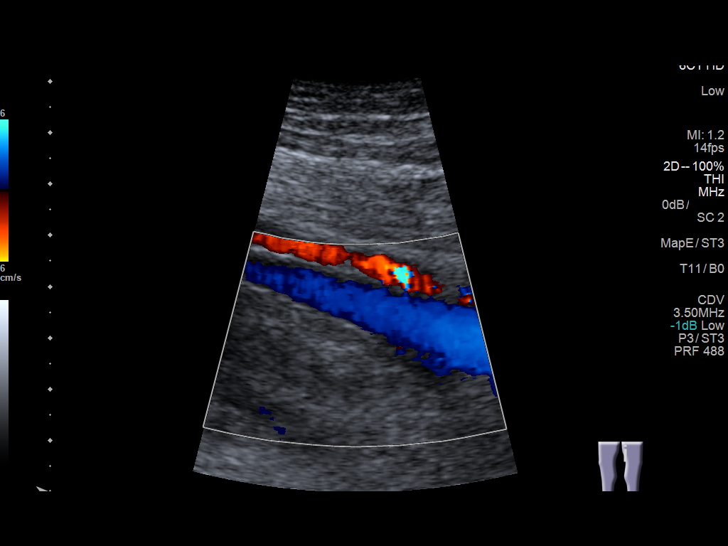
[im 23/33]
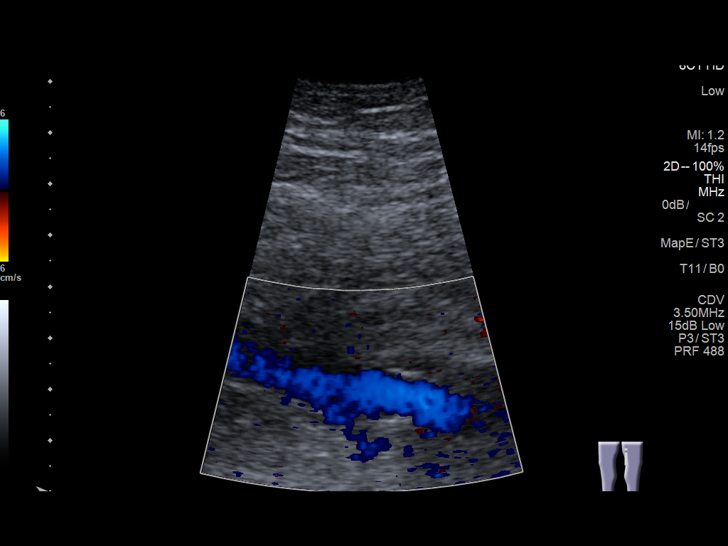
[im 26/33]
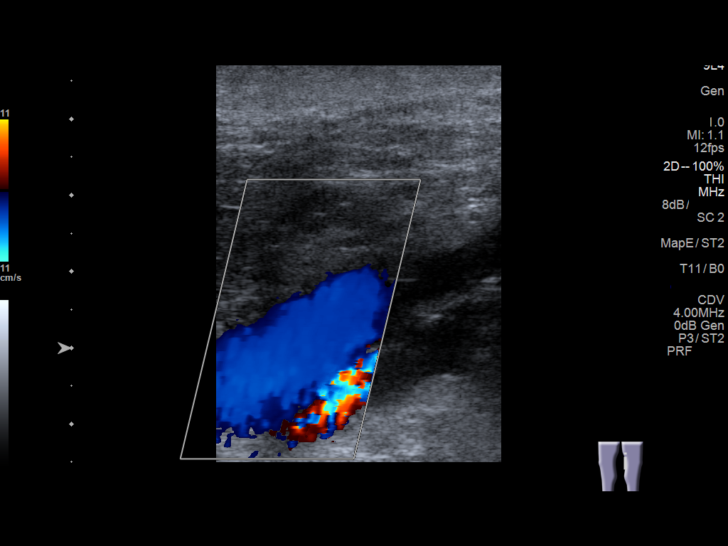
[im 27/33]
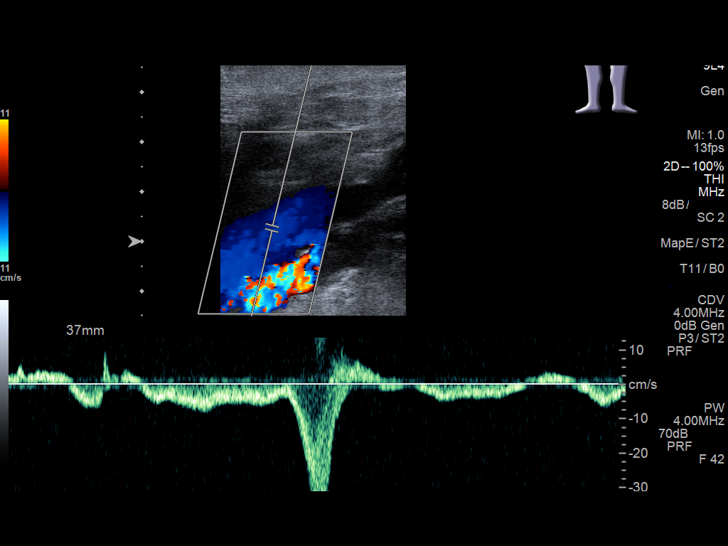
[im 30/33]
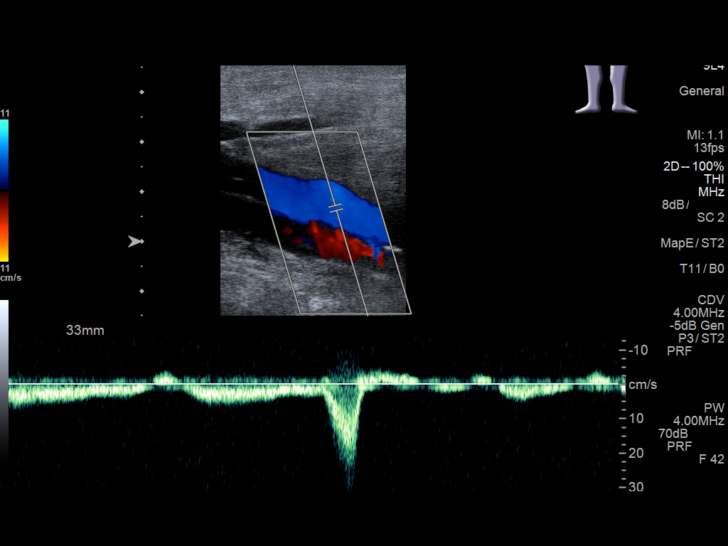
[im 33/33]
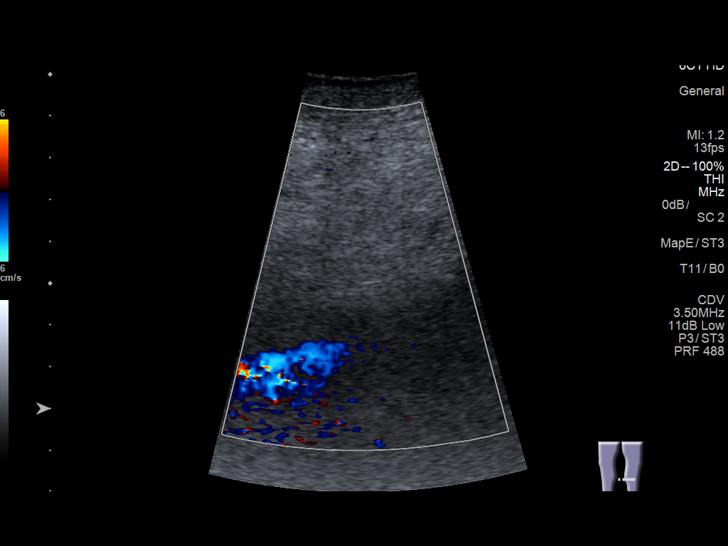

[14 of 24 positions shown; findings below may reference images not displayed]

FINDINGS: There is complete compressibility of the left common femoral,
femoral, and popliteal veins. Doppler analysis demonstrates
respiratory phasicity and augmentation of flow with calf
compression. No obvious superficial vein or calf vein thrombosis.
IMPRESSION: No evidence of left lower extremity DVT.

## 2019-02-18 DIAGNOSIS — E669 Obesity, unspecified: Secondary | ICD-10-CM | POA: Diagnosis not present

## 2019-02-18 DIAGNOSIS — Z125 Encounter for screening for malignant neoplasm of prostate: Secondary | ICD-10-CM | POA: Diagnosis not present

## 2019-02-18 DIAGNOSIS — R972 Elevated prostate specific antigen [PSA]: Secondary | ICD-10-CM | POA: Diagnosis not present

## 2019-02-18 DIAGNOSIS — N5201 Erectile dysfunction due to arterial insufficiency: Secondary | ICD-10-CM | POA: Diagnosis not present

## 2019-02-18 DIAGNOSIS — N401 Enlarged prostate with lower urinary tract symptoms: Secondary | ICD-10-CM | POA: Diagnosis not present

## 2019-02-18 DIAGNOSIS — D400 Neoplasm of uncertain behavior of prostate: Secondary | ICD-10-CM | POA: Diagnosis not present

## 2019-02-24 DIAGNOSIS — E119 Type 2 diabetes mellitus without complications: Secondary | ICD-10-CM | POA: Diagnosis not present

## 2019-03-20 DIAGNOSIS — I10 Essential (primary) hypertension: Secondary | ICD-10-CM | POA: Diagnosis not present

## 2019-03-20 DIAGNOSIS — R809 Proteinuria, unspecified: Secondary | ICD-10-CM | POA: Diagnosis not present

## 2019-03-20 DIAGNOSIS — D631 Anemia in chronic kidney disease: Secondary | ICD-10-CM | POA: Diagnosis not present

## 2019-03-20 DIAGNOSIS — N1831 Chronic kidney disease, stage 3a: Secondary | ICD-10-CM | POA: Diagnosis not present

## 2019-03-20 DIAGNOSIS — D472 Monoclonal gammopathy: Secondary | ICD-10-CM | POA: Diagnosis not present

## 2019-04-10 DIAGNOSIS — E785 Hyperlipidemia, unspecified: Secondary | ICD-10-CM | POA: Diagnosis not present

## 2019-04-10 DIAGNOSIS — E1065 Type 1 diabetes mellitus with hyperglycemia: Secondary | ICD-10-CM | POA: Diagnosis not present

## 2019-04-10 DIAGNOSIS — I10 Essential (primary) hypertension: Secondary | ICD-10-CM | POA: Diagnosis not present

## 2019-04-10 DIAGNOSIS — I7389 Other specified peripheral vascular diseases: Secondary | ICD-10-CM | POA: Diagnosis not present

## 2019-04-10 DIAGNOSIS — E790 Hyperuricemia without signs of inflammatory arthritis and tophaceous disease: Secondary | ICD-10-CM | POA: Diagnosis not present

## 2019-04-10 DIAGNOSIS — E669 Obesity, unspecified: Secondary | ICD-10-CM | POA: Diagnosis not present

## 2019-04-10 DIAGNOSIS — N184 Chronic kidney disease, stage 4 (severe): Secondary | ICD-10-CM | POA: Diagnosis not present

## 2019-04-15 DIAGNOSIS — R69 Illness, unspecified: Secondary | ICD-10-CM | POA: Diagnosis not present

## 2019-04-15 DIAGNOSIS — E118 Type 2 diabetes mellitus with unspecified complications: Secondary | ICD-10-CM | POA: Diagnosis not present

## 2019-04-15 DIAGNOSIS — I080 Rheumatic disorders of both mitral and aortic valves: Secondary | ICD-10-CM | POA: Diagnosis not present

## 2019-04-15 DIAGNOSIS — Z789 Other specified health status: Secondary | ICD-10-CM | POA: Diagnosis not present

## 2019-04-15 DIAGNOSIS — M5116 Intervertebral disc disorders with radiculopathy, lumbar region: Secondary | ICD-10-CM | POA: Diagnosis not present

## 2019-04-17 DIAGNOSIS — R69 Illness, unspecified: Secondary | ICD-10-CM | POA: Diagnosis not present

## 2019-04-17 DIAGNOSIS — E10649 Type 1 diabetes mellitus with hypoglycemia without coma: Secondary | ICD-10-CM | POA: Diagnosis not present

## 2019-04-17 DIAGNOSIS — I7389 Other specified peripheral vascular diseases: Secondary | ICD-10-CM | POA: Diagnosis not present

## 2019-04-17 DIAGNOSIS — E669 Obesity, unspecified: Secondary | ICD-10-CM | POA: Diagnosis not present

## 2019-04-17 DIAGNOSIS — E1065 Type 1 diabetes mellitus with hyperglycemia: Secondary | ICD-10-CM | POA: Diagnosis not present

## 2019-04-17 DIAGNOSIS — E785 Hyperlipidemia, unspecified: Secondary | ICD-10-CM | POA: Diagnosis not present

## 2019-04-17 DIAGNOSIS — Z1331 Encounter for screening for depression: Secondary | ICD-10-CM | POA: Diagnosis not present

## 2019-04-17 DIAGNOSIS — N184 Chronic kidney disease, stage 4 (severe): Secondary | ICD-10-CM | POA: Diagnosis not present

## 2019-04-17 DIAGNOSIS — I10 Essential (primary) hypertension: Secondary | ICD-10-CM | POA: Diagnosis not present

## 2019-04-17 DIAGNOSIS — N401 Enlarged prostate with lower urinary tract symptoms: Secondary | ICD-10-CM | POA: Diagnosis not present

## 2019-04-17 DIAGNOSIS — E790 Hyperuricemia without signs of inflammatory arthritis and tophaceous disease: Secondary | ICD-10-CM | POA: Diagnosis not present

## 2019-04-21 IMAGING — CR DG CHEST 2V
1 series · 2 of 2 positions shown · non-contrast
Comparison: None.

CLINICAL DATA: Preop.  Total knee arthroplasty.

EXAM:
CHEST - 2 VIEW

[Series 1: dg chest 2 view · 0.14mm/px · 2 of 2 slices shown]
[im 1/2]
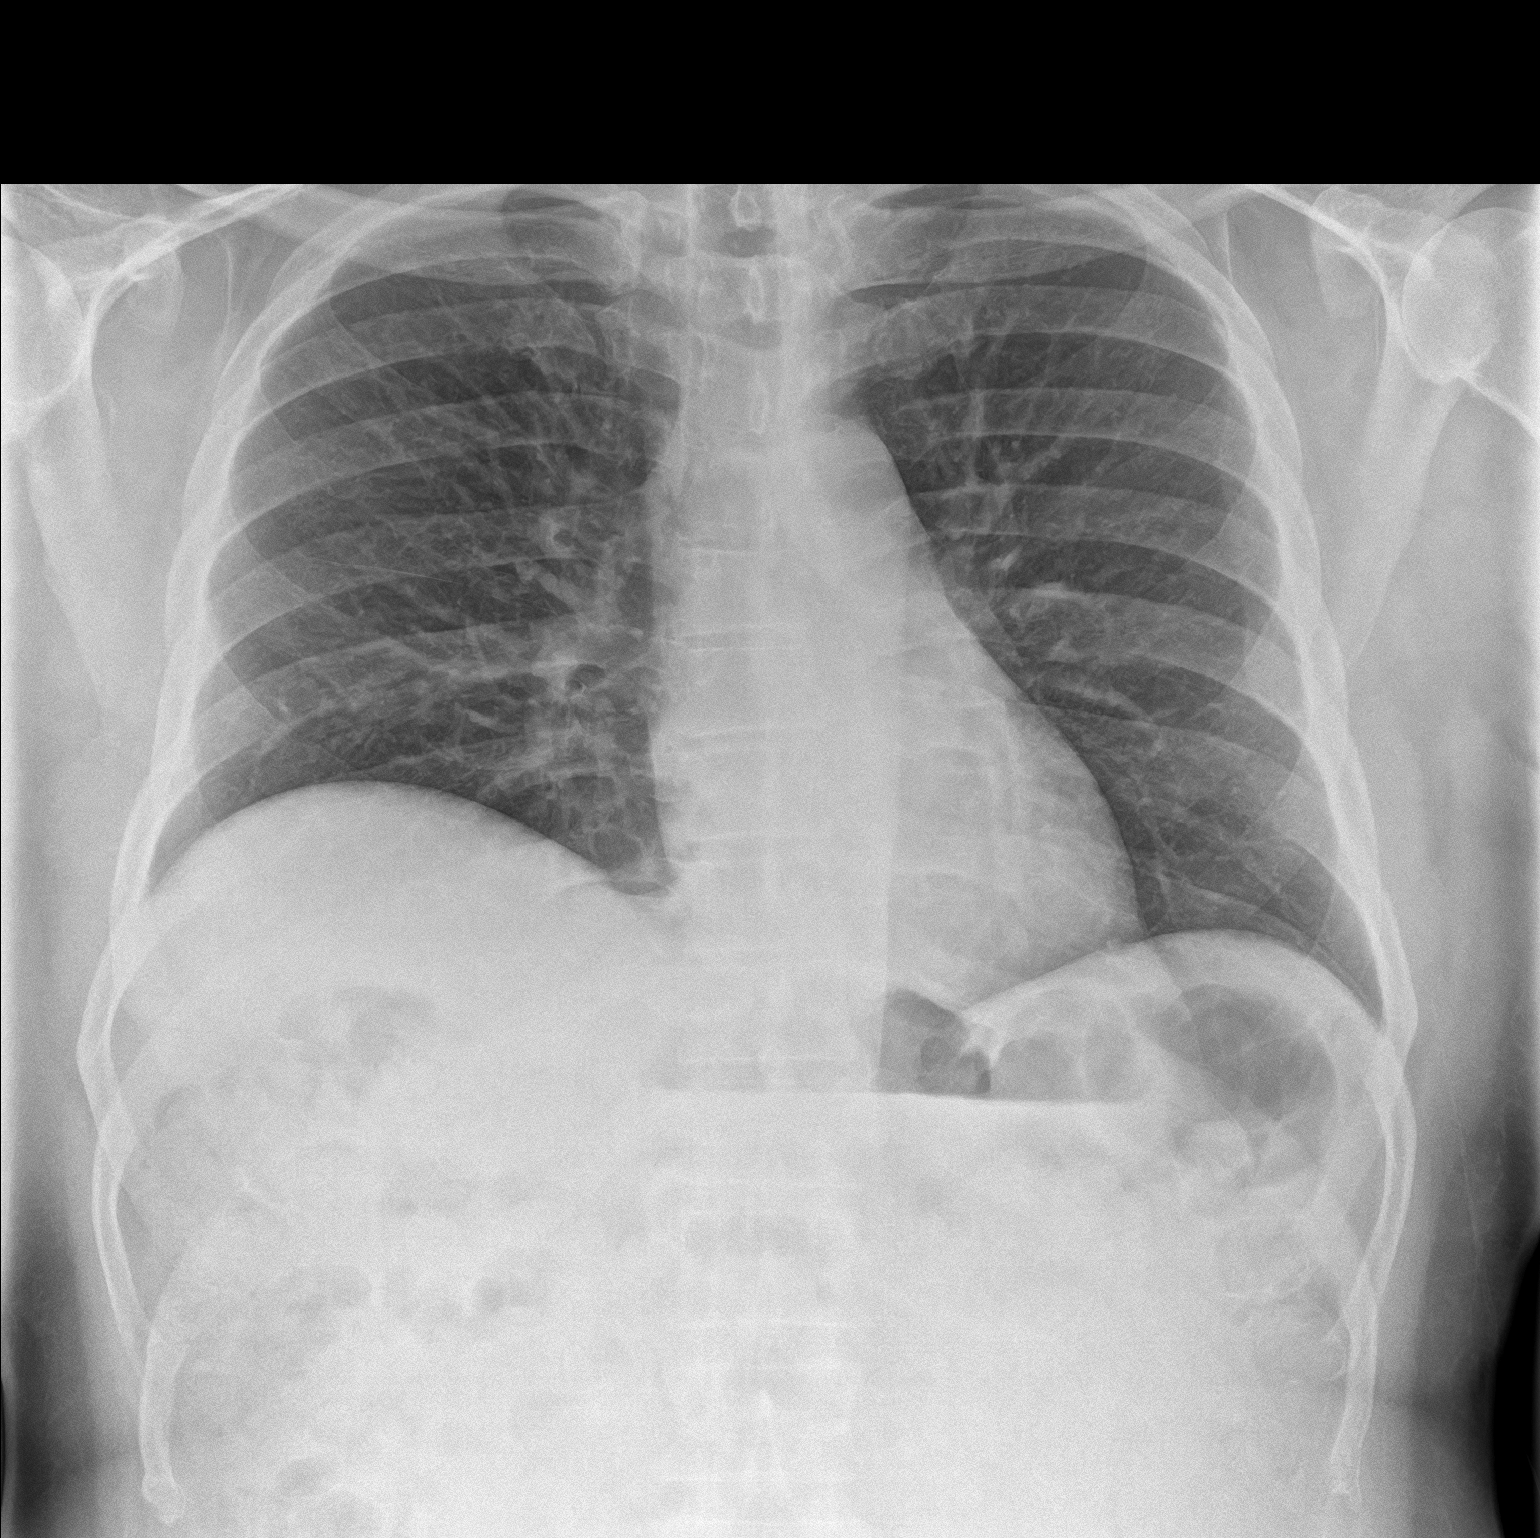
[im 2/2]
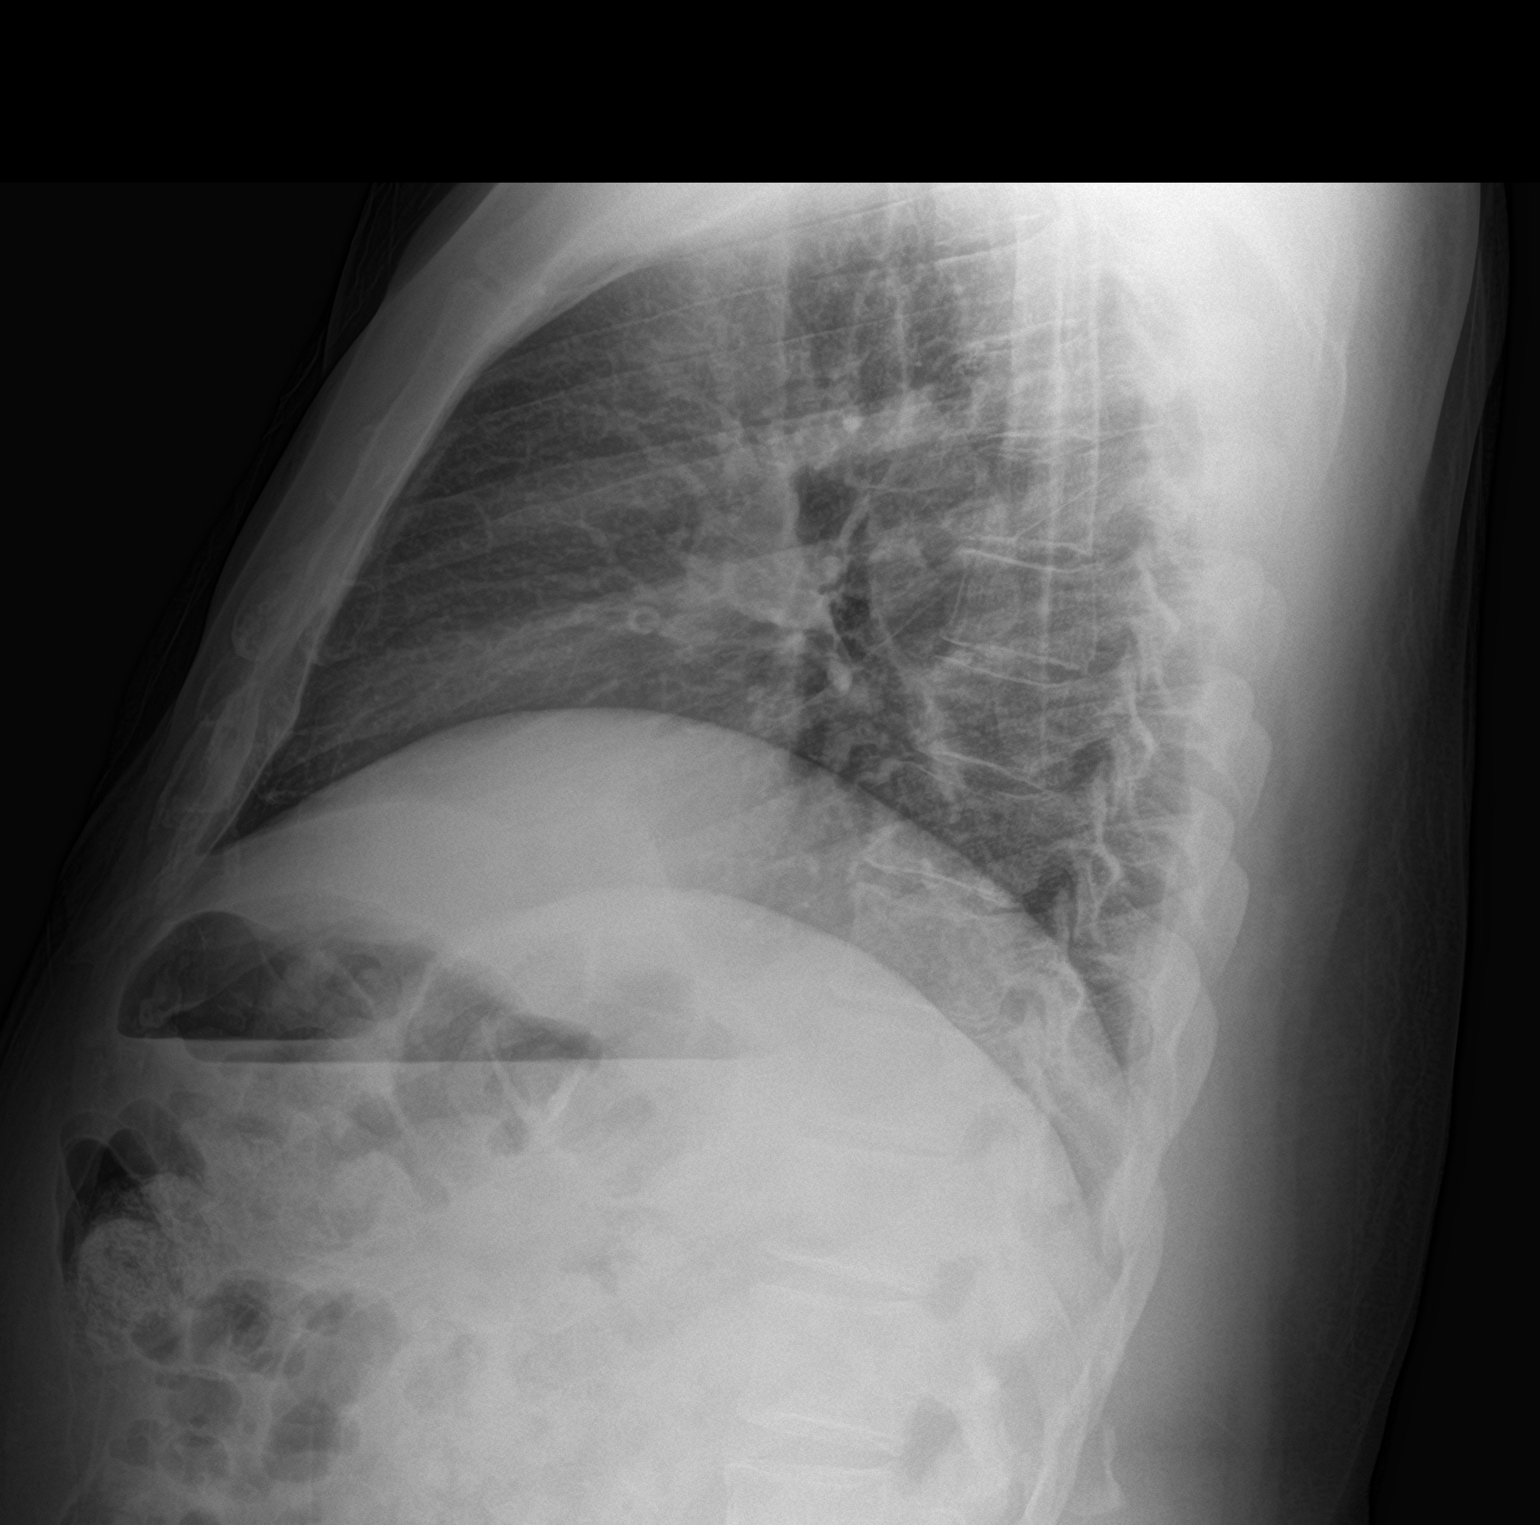

[2 of 2 positions shown; findings below may reference images not displayed]

FINDINGS: Normal heart size. Lungs clear. No pneumothorax. No pleural
effusion.
IMPRESSION: No active cardiopulmonary disease.

## 2019-05-04 IMAGING — DX DG KNEE 1-2V PORT*L*
2 series · 2 of 2 positions shown · non-contrast
Comparison: None.

CLINICAL DATA: Status post left knee replacement

EXAM:
PORTABLE LEFT KNEE - 1-2 VIEW

[knee ap]
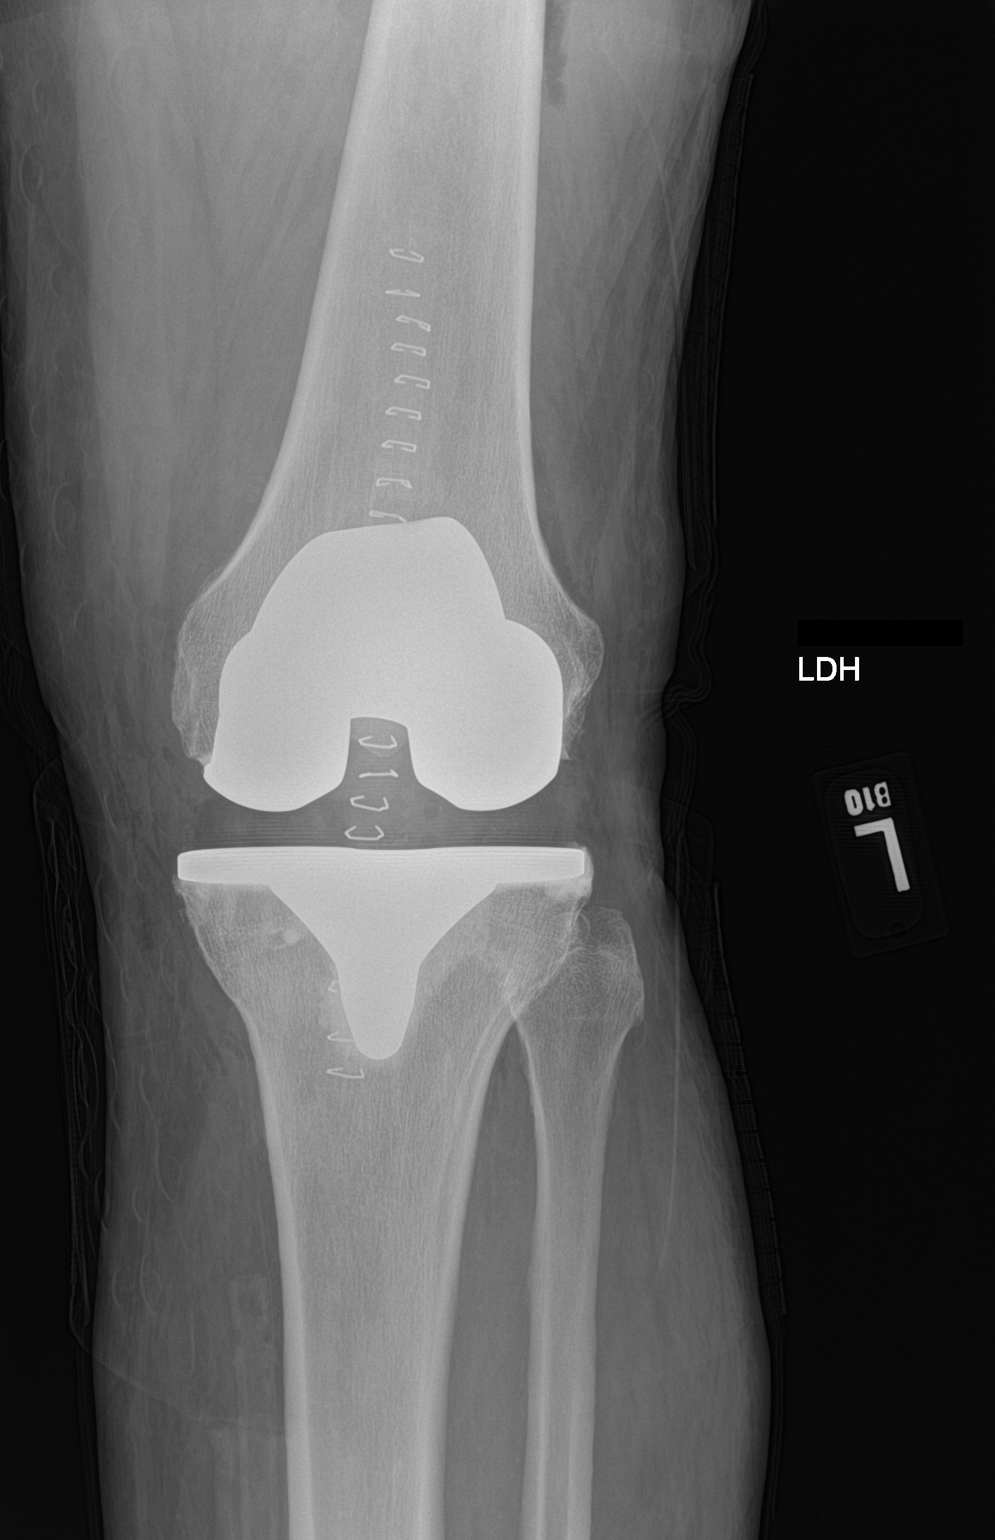

[knee lat]
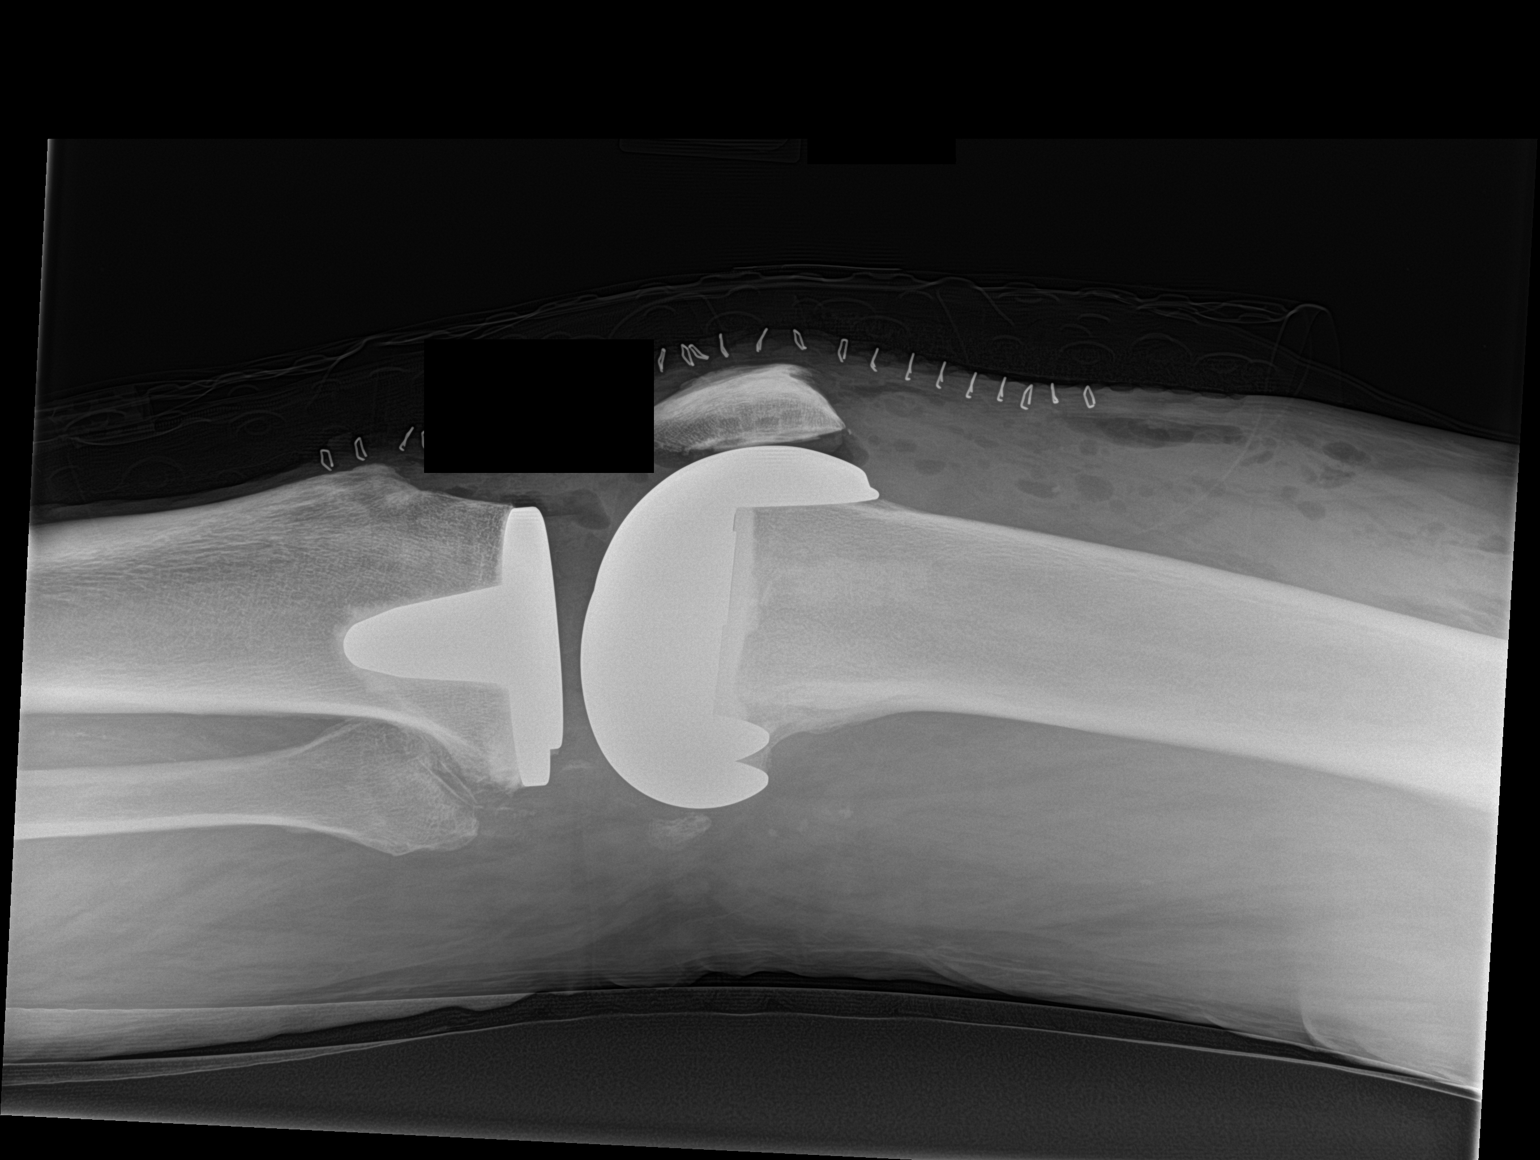

[2 of 2 positions shown; findings below may reference images not displayed]

FINDINGS: Left knee prosthesis is seen. No loosening or acute bony abnormality
is noted. No soft tissue changes are seen.
IMPRESSION: Status post left knee replacement

## 2019-10-13 ENCOUNTER — Encounter: Payer: Self-pay | Admitting: Internal Medicine

## 2019-10-13 ENCOUNTER — Ambulatory Visit (INDEPENDENT_AMBULATORY_CARE_PROVIDER_SITE_OTHER): Payer: Medicare HMO | Admitting: Internal Medicine

## 2019-10-13 ENCOUNTER — Other Ambulatory Visit: Payer: Self-pay

## 2019-10-13 VITALS — BP 148/83 | HR 83 | Wt 248.6 lb

## 2019-10-13 DIAGNOSIS — B20 Human immunodeficiency virus [HIV] disease: Secondary | ICD-10-CM | POA: Insufficient documentation

## 2019-10-13 DIAGNOSIS — E119 Type 2 diabetes mellitus without complications: Secondary | ICD-10-CM

## 2019-10-13 DIAGNOSIS — Z96652 Presence of left artificial knee joint: Secondary | ICD-10-CM

## 2019-10-13 DIAGNOSIS — Z6831 Body mass index (BMI) 31.0-31.9, adult: Secondary | ICD-10-CM

## 2019-10-13 DIAGNOSIS — E669 Obesity, unspecified: Secondary | ICD-10-CM

## 2019-10-13 DIAGNOSIS — Z794 Long term (current) use of insulin: Secondary | ICD-10-CM | POA: Diagnosis not present

## 2019-10-13 NOTE — Assessment & Plan Note (Signed)
Left knee is stable does not have any pain and does not have any trouble walking.

## 2019-10-13 NOTE — Progress Notes (Signed)
Patient ID: LARZ MARK, male   DOB: 12/13/1940, 79 y.o.   MRN: [id number]    Established Patient Office Visit  Subjective:  Patient ID: Keith Howe, male    DOB: [birth year]  Age: 79 y.o. MRN: [id number]  CC:  Chief Complaint  Patient presents with  . Diabetes    patient checked blood sugar from home and it was 107    HPI  Keith Howe presents for a follow up regarding his diabetes. This morning, his blood sugar was 107. He takes 7 or 8 units of insulin before meals and 80 units every morning. He checks his blood sugar QID. The patient denies chest pain, trouble breathing, trouble swallowing, leg swelling, abdominal pain, and SOB. The patient does not smoke or drink alcohol. He sees an eye doctor regularly and had a colonoscopy a couple of years ago. He received both of his COVID-19 vaccines. He rides a stationary bike, lifts weights, and walks regularly to stay active. He is taking all of his medications as prescribed. He had a left knee replacement in 03/2018 and no longer has pain or trouble walking.  Past Medical History:  Diagnosis Date  . BPH (benign prostatic hyperplasia)   . Diabetes mellitus without complication ([place])   . HOH (hard of hearing)    AIDS  . Hypertension   . Pain    CHRONIC LBP    Past Surgical History:  Procedure Laterality Date  . BACK SURGERY    . CATARACT EXTRACTION W/PHACO Right 07/10/2017   Procedure: CATARACT EXTRACTION PHACO AND INTRAOCULAR LENS PLACEMENT (IOC);  Surgeon: Birder Robson, MD;  Location: [facility] ORS;  Service: Ophthalmology;  Laterality: Right;  [place] 00:29.2 AP% 16.5 CDE 4.83 Fluid Pack Lot # [id number]  . COLONOSCOPY    . EYE SURGERY    . HERNIA REPAIR    . TOTAL KNEE ARTHROPLASTY Left 03/19/2018   Procedure: TOTAL KNEE ARTHROPLASTY;  Surgeon: Thornton Park, MD;  Location: [facility] ORS;  Service: Orthopedics;  Laterality: Left;    Family History  Problem Relation Age of Onset  . Diabetes Father   . Breast cancer Sister      Social History   Socioeconomic History  . Marital status: Married    Spouse name: Not on file  . Number of children: Not on file  . Years of education: Not on file  . Highest education level: Not on file  Occupational History  . Not on file  Tobacco Use  . Smoking status: Never Smoker  . Smokeless tobacco: Never Used  Substance and Sexual Activity  . Alcohol use: No  . Drug use: Never  . Sexual activity: Not on file  Other Topics Concern  . Not on file  Social History Narrative   Independent at baseline.  Lives at home with his wife   Social Determinants of Health   Financial Resource Strain:   . Difficulty of Paying Living Expenses:   Food Insecurity:   . Worried About Charity fundraiser in the Last Year:   . Arboriculturist in the Last Year:   Transportation Needs:   . Film/video editor (Medical):   Marland Kitchen Lack of Transportation (Non-Medical):   Physical Activity:   . Days of Exercise per Week:   . Minutes of Exercise per Session:   Stress:   . Feeling of Stress :   Social Connections:   . Frequency of Communication with Friends and Family:   . Frequency of Social Gatherings  with Friends and Family:   . Attends Religious Services:   . Active Member of Clubs or Organizations:   . Attends Archivist Meetings:   Marland Kitchen Marital Status:   Intimate Partner Violence:   . Fear of Current or Ex-Partner:   . Emotionally Abused:   Marland Kitchen Physically Abused:   . Sexually Abused:      Current Outpatient Medications:  .  aspirin EC 81 MG tablet, Take 81 mg by mouth daily., Disp: , Rfl:  .  cholecalciferol (VITAMIN D) 1000 units tablet, Take 1,000 Units by mouth daily., Disp: , Rfl:  .  dutasteride (AVODART) 0.5 MG capsule, Take 0.5 mg by mouth daily., Disp: , Rfl: 3 .  ferrous sulfate 325 (65 FE) MG tablet, Take 1 tablet (325 mg total) by mouth 2 (two) times daily with a meal., Disp: 60 tablet, Rfl: 3 .  lisinopril-hydrochlorothiazide (PRINZIDE,ZESTORETIC) 20-12.5  MG tablet, Take 2 tablets by mouth daily., Disp: , Rfl:  .  lovastatin (MEVACOR) 40 MG tablet, Take 40 mg by mouth every morning., Disp: , Rfl:  .  metoprolol tartrate (LOPRESSOR) 50 MG tablet, Take 1 tablet (50 mg total) by mouth 2 (two) times daily., Disp: 60 tablet, Rfl: 0 .  Misc Natural Products (PROSTATE SUPPORT PO), Take 2 capsules by mouth daily., Disp: , Rfl:  .  NOVOLOG FLEXPEN 100 UNIT/ML FlexPen, Inject 7 Units into the skin 3 (three) times daily before meals., Disp: , Rfl: 0 .  Omega-3 Fatty Acids (FISH OIL) 1000 MG CAPS, Take 1,000 mg by mouth daily. , Disp: , Rfl:  .  oxyCODONE (OXY IR/ROXICODONE) 5 MG immediate release tablet, Take 1 tablet (5 mg total) by mouth every 4 (four) hours as needed for moderate pain (pain score 4-6)., Disp: 40 tablet, Rfl: 0 .  tamsulosin (FLOMAX) 0.4 MG CAPS capsule, Take 0.4 mg by mouth daily after breakfast. , Disp: , Rfl:  .  TRESIBA FLEXTOUCH 200 UNIT/ML SOPN, Inject 80 Units into the muscle every morning., Disp: , Rfl: 2 .  TRULICITY 1.5 IR/6.7EL SOPN, Inject 1.5 mg into the muscle every Wednesday. , Disp: , Rfl: 1   No Known Allergies  ROS Review of Systems  Constitutional: Negative.   HENT: Negative.   Eyes: Negative.   Respiratory: Negative.  Negative for shortness of breath.   Cardiovascular: Negative.  Negative for chest pain and leg swelling.  Gastrointestinal: Negative.  Negative for abdominal pain.  Endocrine: Negative.   Genitourinary: Negative.   Musculoskeletal: Negative.   Skin: Negative.   Allergic/Immunologic: Negative.   Neurological: Negative.   Hematological: Negative.   Psychiatric/Behavioral: Negative.       Objective:    Physical Exam  Constitutional: The patient is oriented to person, place, and time. Pt appears well-developed and well-nourished.  Head: Normocephalic and atraumatic.  Eyes: Pupils are equal, round, and reactive to light.  Neck: No JVD present. No tracheal deviation present. No thyromegaly  present.  Cardiovascular: Regular rate and rhythm. No gallop. Pulmonary/Chest: Normal breath sounds. Lungs clear to auscultation. Abdominal: No abdominal tenderness. No guarding or rebound tenderness.. Musculoskeletal: Normal range of motion.  Lymphatic: No cervical adenopathy.  Neurological: No cranial nerve deficit.  Skin: Skin is warm and hydrated.  Psychiatric: The patient has a normal mood and affect.  BP (!) 148/83   Pulse 83   Wt 248 lb 9.6 oz (112.8 kg)   BMI 32.80 kg/m  Wt Readings from Last 3 Encounters:  10/13/19 248 lb 9.6 oz (112.8 kg)  06/19/18 249 lb (112.9 kg)  06/04/18 244 lb 6.4 oz (110.9 kg)     Health Maintenance Due  Topic Date Due  . FOOT EXAM  Never done  . OPHTHALMOLOGY EXAM  Never done  . COVID-19 Vaccine (1) Never done  . TETANUS/TDAP  Never done  . HEMOGLOBIN A1C  09/05/2018  . PNA vac Low Risk Adult (2 of 2 - PCV13) 03/23/2019    There are no preventive care reminders to display for this patient.  No results found for: TSH Lab Results  Component Value Date   WBC 5.2 06/04/2018   HGB 13.7 06/04/2018   HCT 42.9 06/04/2018   MCV 83.3 06/04/2018   PLT 209 06/04/2018   Lab Results  Component Value Date   NA 144 06/04/2018   K 4.4 06/04/2018   CO2 29 06/04/2018   GLUCOSE 162 (H) 06/04/2018   BUN 23 06/04/2018   CREATININE 1.66 (H) 06/04/2018   BILITOT 0.5 06/04/2018   ALKPHOS 57 06/04/2018   AST 26 06/04/2018   ALT 24 06/04/2018   PROT 7.6 06/04/2018   ALBUMIN 4.3 06/04/2018   CALCIUM 9.5 06/04/2018   ANIONGAP 9 06/04/2018   No results found for: CHOL No results found for: HDL No results found for: LDLCALC No results found for: TRIG No results found for: CHOLHDL Lab Results  Component Value Date   HGBA1C 7.3 (H) 03/06/2018      Assessment & Plan:   Problem List Items Addressed This Visit      Endocrine   Type 2 diabetes mellitus without complication, with long-term current use of insulin ([place]) - Primary  Pt checks  bs 4  time daily     Other   S/P TKR (total knee replacement) using cement, left    Left knee is stable does not have any pain and does not have any trouble walking.      Class 1 obesity with serious comorbidity and body mass index (BMI) of 31.0 to 31.9 in adult  Pt was advised to lose wt      No orders of the defined types were placed in this encounter.   Follow-up: Return in about 2 months (around 12/13/2019).   By signing my name below, I, De Burrs, attest that this documentation has been prepared under the direction and in the presence of Cletis Athens, MD. Electronically Signed: De Burrs, Medical Scribe. 10/13/19. 9:44 AM.  .I personally performed the services described in this documentation, which was SCRIBED in my presence. The recorded information has been reviewed and considered accurate. It has been edited as necessary during review. Cletis Athens, MD

## 2019-11-03 ENCOUNTER — Other Ambulatory Visit: Payer: Self-pay | Admitting: Internal Medicine

## 2019-11-03 DIAGNOSIS — E118 Type 2 diabetes mellitus with unspecified complications: Secondary | ICD-10-CM

## 2019-12-15 ENCOUNTER — Other Ambulatory Visit: Payer: Self-pay

## 2019-12-15 ENCOUNTER — Ambulatory Visit (INDEPENDENT_AMBULATORY_CARE_PROVIDER_SITE_OTHER): Payer: Medicare HMO | Admitting: Internal Medicine

## 2019-12-15 ENCOUNTER — Encounter: Payer: Self-pay | Admitting: Internal Medicine

## 2019-12-15 VITALS — BP 159/84 | HR 72 | Ht 74.0 in | Wt 249.7 lb

## 2019-12-15 DIAGNOSIS — Z96652 Presence of left artificial knee joint: Secondary | ICD-10-CM

## 2019-12-15 DIAGNOSIS — Z794 Long term (current) use of insulin: Secondary | ICD-10-CM

## 2019-12-15 DIAGNOSIS — E119 Type 2 diabetes mellitus without complications: Secondary | ICD-10-CM | POA: Diagnosis not present

## 2019-12-15 DIAGNOSIS — E669 Obesity, unspecified: Secondary | ICD-10-CM | POA: Diagnosis not present

## 2019-12-15 DIAGNOSIS — I10 Essential (primary) hypertension: Secondary | ICD-10-CM | POA: Diagnosis not present

## 2019-12-15 DIAGNOSIS — Z6831 Body mass index (BMI) 31.0-31.9, adult: Secondary | ICD-10-CM

## 2019-12-15 LAB — GLUCOSE, POCT (MANUAL RESULT ENTRY): POC Glucose: 94 mg/dl (ref 70–99)

## 2019-12-15 NOTE — Assessment & Plan Note (Signed)
-   Today, the patient's blood pressure is not well managed on lisinopril. - The patient will continue the current treatment regimen.  - I encouraged the patient to eat a low-sodium diet to help control blood pressure. - I encouraged the patient to live an active lifestyle and complete activities that increases heart rate to 85% target heart rate at least 5 times per week for one hour.

## 2019-12-15 NOTE — Assessment & Plan Note (Signed)
Still  Has pain lt knee

## 2019-12-15 NOTE — Progress Notes (Signed)
Established Patient Office Visit  SUBJECTIVE:  Subjective  Patient ID: Keith Howe, male    DOB: April 24, 1941  Age: 79 y.o. MRN: [id number]  CC:  Chief Complaint  Patient presents with  . Diabetes    2 month follow up    HPI Keith Howe is a 79 y.o. male presenting today for a 2 month diabetes follow up  Diabetes He presents for his follow-up diabetic visit. He has type 2 diabetes mellitus. His disease course has been stable. Pertinent negatives for diabetes include no chest pain. Symptoms are stable. Risk factors for coronary artery disease include male sex, obesity, hypertension, diabetes mellitus and family history. Current diabetic treatment includes insulin injections. He is compliant with treatment all of the time. Insulin dose schedule: Tresiba 80 units daily at breakfast; Trulicity 1.5mg  weekly; Novolog 7 units TID before meals. He is following a generally healthy diet. His home blood glucose trend is fluctuating minimally. His breakfast blood glucose is taken between 7-8 am. His breakfast blood glucose range is generally 110-130 mg/dl. (94mg /dl today)  Hypertension This is a chronic problem. The problem has been gradually worsening since onset. The problem is uncontrolled (159/84 today). Pertinent negatives include no chest pain or shortness of breath. Risk factors for coronary artery disease include diabetes mellitus, male gender and obesity. Past treatments include lifestyle changes and ACE inhibitors. The current treatment provides mild improvement. There are no compliance problems.    He continues to have occasional pain in his left knee, usually brought on by activity.   Past Medical History:  Diagnosis Date  . BPH (benign prostatic hyperplasia)   . Diabetes mellitus without complication ([place])   . HOH (hard of hearing)    AIDS  . Hypertension   . Pain    CHRONIC LBP    Past Surgical History:  Procedure Laterality Date  . BACK SURGERY    . CATARACT  EXTRACTION W/PHACO Right 07/10/2017   Procedure: CATARACT EXTRACTION PHACO AND INTRAOCULAR LENS PLACEMENT (IOC);  Surgeon: Birder Robson, MD;  Location: [facility] ORS;  Service: Ophthalmology;  Laterality: Right;  [place] 00:29.2 AP% 16.5 CDE 4.83 Fluid Pack Lot # [id number]  . COLONOSCOPY    . EYE SURGERY    . HERNIA REPAIR    . TOTAL KNEE ARTHROPLASTY Left 03/19/2018   Procedure: TOTAL KNEE ARTHROPLASTY;  Surgeon: Thornton Park, MD;  Location: [facility] ORS;  Service: Orthopedics;  Laterality: Left;    Family History  Problem Relation Age of Onset  . Diabetes Father   . Breast cancer Sister     Social History   Socioeconomic History  . Marital status: Married    Spouse name: Not on file  . Number of children: Not on file  . Years of education: Not on file  . Highest education level: Not on file  Occupational History  . Not on file  Tobacco Use  . Smoking status: Never Smoker  . Smokeless tobacco: Never Used  Vaping Use  . Vaping Use: Never used  Substance and Sexual Activity  . Alcohol use: No  . Drug use: Never  . Sexual activity: Not on file  Other Topics Concern  . Not on file  Social History Narrative   Independent at baseline.  Lives at home with his wife   Social Determinants of Health   Financial Resource Strain:   . Difficulty of Paying Living Expenses:   Food Insecurity:   . Worried About Charity fundraiser in the Last Year:   .  Ran Out of Food in the Last Year:   Transportation Needs:   . Film/video editor (Medical):   Marland Kitchen Lack of Transportation (Non-Medical):   Physical Activity:   . Days of Exercise per Week:   . Minutes of Exercise per Session:   Stress:   . Feeling of Stress :   Social Connections:   . Frequency of Communication with Friends and Family:   . Frequency of Social Gatherings with Friends and Family:   . Attends Religious Services:   . Active Member of Clubs or Organizations:   . Attends Archivist Meetings:   Marland Kitchen Marital  Status:   Intimate Partner Violence:   . Fear of Current or Ex-Partner:   . Emotionally Abused:   Marland Kitchen Physically Abused:   . Sexually Abused:      Current Outpatient Medications:  .  aspirin EC 81 MG tablet, Take 81 mg by mouth daily., Disp: , Rfl:  .  cholecalciferol (VITAMIN D) 1000 units tablet, Take 1,000 Units by mouth daily., Disp: , Rfl:  .  dutasteride (AVODART) 0.5 MG capsule, Take 0.5 mg by mouth daily., Disp: , Rfl: 3 .  ferrous sulfate 325 (65 FE) MG tablet, Take 1 tablet (325 mg total) by mouth 2 (two) times daily with a meal., Disp: 60 tablet, Rfl: 3 .  lisinopril-hydrochlorothiazide (PRINZIDE,ZESTORETIC) 20-12.5 MG tablet, Take 2 tablets by mouth daily., Disp: , Rfl:  .  lovastatin (MEVACOR) 40 MG tablet, Take 40 mg by mouth every morning., Disp: , Rfl:  .  metoprolol tartrate (LOPRESSOR) 50 MG tablet, TAKE 1 TABLET BY MOUTH EVERY 12 HOURS, Disp: 180 tablet, Rfl: 2 .  Misc Natural Products (PROSTATE SUPPORT PO), Take 2 capsules by mouth daily., Disp: , Rfl:  .  NOVOLOG FLEXPEN 100 UNIT/ML FlexPen, Inject 7 Units into the skin 3 (three) times daily before meals., Disp: , Rfl: 0 .  Omega-3 Fatty Acids (FISH OIL) 1000 MG CAPS, Take 1,000 mg by mouth daily. , Disp: , Rfl:  .  oxyCODONE (OXY IR/ROXICODONE) 5 MG immediate release tablet, Take 1 tablet (5 mg total) by mouth every 4 (four) hours as needed for moderate pain (pain score 4-6)., Disp: 40 tablet, Rfl: 0 .  tamsulosin (FLOMAX) 0.4 MG CAPS capsule, Take 0.4 mg by mouth daily after breakfast. , Disp: , Rfl:  .  TRESIBA FLEXTOUCH 200 UNIT/ML SOPN, Inject 80 Units into the muscle every morning., Disp: , Rfl: 2 .  TRULICITY 1.5 VO/1.6WV SOPN, Inject 1.5 mg into the muscle every Wednesday. , Disp: , Rfl: 1   No Known Allergies  ROS Review of Systems  Constitutional: Negative.   HENT: Negative.   Eyes: Negative.   Respiratory: Negative.  Negative for shortness of breath.   Cardiovascular: Negative.  Negative for chest pain.    Gastrointestinal: Negative.   Endocrine: Negative.   Genitourinary: Negative.   Musculoskeletal: Negative.   Skin: Negative.   Allergic/Immunologic: Negative.   Neurological: Negative.   Hematological: Negative.   Psychiatric/Behavioral: Negative.   All other systems reviewed and are negative.    OBJECTIVE:    Physical Exam Vitals reviewed.  Constitutional:      Appearance: Normal appearance.  HENT:     Mouth/Throat:     Mouth: Mucous membranes are moist.  Eyes:     Pupils: Pupils are equal, round, and reactive to light.  Neck:     Vascular: No carotid bruit.  Cardiovascular:     Rate and Rhythm: Normal rate and  regular rhythm.     Pulses: Normal pulses.     Heart sounds: Normal heart sounds.  Pulmonary:     Effort: Pulmonary effort is normal.     Breath sounds: Normal breath sounds.  Abdominal:     General: Bowel sounds are normal.     Palpations: Abdomen is soft. There is no hepatomegaly, splenomegaly or mass.     Tenderness: There is no abdominal tenderness.     Hernia: No hernia is present.  Musculoskeletal:     Cervical back: Neck supple.     Right lower leg: 1+ Edema present.     Left lower leg: 1+ Edema present.  Skin:    Findings: No rash.  Neurological:     Mental Status: He is alert and oriented to person, place, and time.     Motor: No weakness.  Psychiatric:        Mood and Affect: Mood normal.        Behavior: Behavior normal.     BP (!) 159/84   Pulse 72   Ht 6\' 2"  (1.88 m)   Wt 249 lb 11.2 oz (113.3 kg)   BMI 32.06 kg/m  Wt Readings from Last 3 Encounters:  12/15/19 249 lb 11.2 oz (113.3 kg)  10/13/19 248 lb 9.6 oz (112.8 kg)  06/19/18 249 lb (112.9 kg)    Health Maintenance Due  Topic Date Due  . Hepatitis C Screening  Never done  . FOOT EXAM  Never done  . OPHTHALMOLOGY EXAM  Never done  . COVID-19 Vaccine (1) Never done  . TETANUS/TDAP  Never done  . HEMOGLOBIN A1C  09/05/2018  . PNA vac Low Risk Adult (2 of 2 - PCV13)  03/23/2019  . INFLUENZA VACCINE  12/07/2019    There are no preventive care reminders to display for this patient.  CBC Latest Ref Rng & Units 06/04/2018 03/24/2018 03/23/2018  WBC 4.0 - 10.5 K/uL 5.2 8.6 10.7(H)  Hemoglobin 13.0 - 17.0 g/dL 13.7 8.1(L) 7.8(L)  Hematocrit 39 - 52 % 42.9 23.7(L) 23.4(L)  Platelets 150 - 400 K/uL 209 178 158   CMP Latest Ref Rng & Units 06/04/2018 03/24/2018 03/23/2018  Glucose 70 - 99 mg/dL 162(H) 110(H) 161(H)  BUN 8 - 23 mg/dL 23 53(H) 58(H)  Creatinine 0.61 - 1.24 mg/dL 1.66(H) 2.13(H) 2.44(H)  Sodium 135 - 145 mmol/L 144 133(L) 132(L)  Potassium 3.5 - 5.1 mmol/L 4.4 4.8 4.8  Chloride 98 - 111 mmol/L 106 102 99  CO2 22 - 32 mmol/L 29 26 26   Calcium 8.9 - 10.3 mg/dL 9.5 7.8(L) 7.9(L)  Total Protein 6.5 - 8.1 g/dL 7.6 - -  Total Bilirubin 0.3 - 1.2 mg/dL 0.5 - -  Alkaline Phos 38 - 126 U/L 57 - -  AST 15 - 41 U/L 26 - -  ALT 0 - 44 U/L 24 - -    No results found for: TSH Lab Results  Component Value Date   ALBUMIN 4.3 06/04/2018   ANIONGAP 9 06/04/2018   No results found for: CHOL, HDL, LDLCALC, CHOLHDL No results found for: TRIG Lab Results  Component Value Date   HGBA1C 7.3 (H) 03/06/2018      ASSESSMENT & PLAN:   Problem List Items Addressed This Visit      Cardiovascular and Mediastinum   Essential hypertension    - Today, the patient's blood pressure is not well managed on lisinopril. - The patient will continue the current treatment regimen.  - I encouraged  the patient to eat a low-sodium diet to help control blood pressure. - I encouraged the patient to live an active lifestyle and complete activities that increases heart rate to 85% target heart rate at least 5 times per week for one hour.            Endocrine   Type 2 diabetes mellitus without complication, with long-term current use of insulin ([place]) - Primary    - The patient's blood sugar is under control on insulin. - The patient will continue the current  treatment regimen.  - I encouraged the patient to regularly check blood sugar.  - I encouraged the patient to monitor diet. I encouraged the patient to eat low-carb and low-sugar to help prevent blood sugar spikes.  - I encouraged the patient to continue following their prescribed treatment plan for diabetes - I informed the patient to get help if blood sugar drops below 54mg /dL, or if suddenly have trouble thinking clearly or breathing.          Relevant Orders   POCT glucose (manual entry) (Completed)     Other   S/P TKR (total knee replacement) using cement, left    Still  Has pain lt knee      Class 1 obesity with serious comorbidity and body mass index (BMI) of 31.0 to 31.9 in adult    - I encouraged the patient to lose weight.  - I educated them on making healthy dietary choices including eating more fruits and vegetables and less fried foods. - I encouraged the patient to exercise more, and educated on the benefits of exercise including weight loss, diabetes management, and hypertension management.          No orders of the defined types were placed in this encounter.   Follow-up: Return in about 3 months (around 03/16/2020).    Dr. Jane Canary [facility] [address], [place], [place] [address]   By signing my name below, I, [employer], attest that this documentation has been prepared under the direction and in the presence of Cletis Athens, MD. Electronically Signed: Cletis Athens, MD 12/15/19, 9:43 AM   I personally performed the services described in this documentation, which was SCRIBED in my presence. The recorded information has been reviewed and considered accurate. It has been edited as necessary during review. Cletis Athens, MD

## 2019-12-15 NOTE — Assessment & Plan Note (Signed)
-   I encouraged the patient to lose weight.  - I educated them on making healthy dietary choices including eating more fruits and vegetables and less fried foods. - I encouraged the patient to exercise more, and educated on the benefits of exercise including weight loss, diabetes management, and hypertension management.   

## 2019-12-15 NOTE — Assessment & Plan Note (Signed)
-   The patient's blood sugar is under control on  insulin. - The patient will continue the current treatment regimen.  - I encouraged the patient to regularly check blood sugar.  - I encouraged the patient to monitor diet. I encouraged the patient to eat low-carb and low-sugar to help prevent blood sugar spikes.  - I encouraged the patient to continue following their prescribed treatment plan for diabetes - I informed the patient to get help if blood sugar drops below 54mg/dL, or if suddenly have trouble thinking clearly or breathing.     

## 2020-02-18 ENCOUNTER — Ambulatory Visit (INDEPENDENT_AMBULATORY_CARE_PROVIDER_SITE_OTHER): Payer: Medicare HMO | Admitting: Internal Medicine

## 2020-02-18 ENCOUNTER — Encounter: Payer: Self-pay | Admitting: Internal Medicine

## 2020-02-18 ENCOUNTER — Other Ambulatory Visit: Payer: Self-pay

## 2020-02-18 VITALS — BP 132/70 | HR 77 | Ht 74.0 in | Wt 250.6 lb

## 2020-02-18 DIAGNOSIS — Z0000 Encounter for general adult medical examination without abnormal findings: Secondary | ICD-10-CM | POA: Insufficient documentation

## 2020-02-18 DIAGNOSIS — Z23 Encounter for immunization: Secondary | ICD-10-CM

## 2020-02-18 DIAGNOSIS — E119 Type 2 diabetes mellitus without complications: Secondary | ICD-10-CM

## 2020-02-18 DIAGNOSIS — I10 Essential (primary) hypertension: Secondary | ICD-10-CM

## 2020-02-18 DIAGNOSIS — Z794 Long term (current) use of insulin: Secondary | ICD-10-CM

## 2020-02-18 DIAGNOSIS — E66811 Obesity, class 1: Secondary | ICD-10-CM

## 2020-02-18 DIAGNOSIS — E669 Obesity, unspecified: Secondary | ICD-10-CM | POA: Diagnosis not present

## 2020-02-18 DIAGNOSIS — Z96652 Presence of left artificial knee joint: Secondary | ICD-10-CM | POA: Diagnosis not present

## 2020-02-18 DIAGNOSIS — Z6831 Body mass index (BMI) 31.0-31.9, adult: Secondary | ICD-10-CM

## 2020-02-18 NOTE — Assessment & Plan Note (Signed)
Patient knee is doing good.  He has 1+ swelling of the both legs pedal pulses are okay.  He was advised to lose weight.

## 2020-02-18 NOTE — Progress Notes (Signed)
Established Patient Office Visit  SUBJECTIVE:  Subjective  Patient ID: Keith Howe, male    DOB: [birth year]  Age: 79 y.o. MRN: [id number]  CC:  Chief Complaint  Patient presents with  . Diabetes    patient checked blood sugar from home and it was 152    HPI Keith Howe is a 79 y.o. male presenting today for an annual physical.  He denies any major issues. He states that he has been well overall. He is taking his medications as directed and without any complication. He denies any missed doses. His blood sugar today was 152. His blood pressure was 132/70. He states that he has had a lot of difficulty losing weight; weight loss materials provided.   He does note some muscle cramping in his right hip and buttock it it manageable.   He has an upcoming appointment with the optometrist.    Past Medical History:  Diagnosis Date  . BPH (benign prostatic hyperplasia)   . Diabetes mellitus without complication ([place])   . HOH (hard of hearing)    AIDS  . Hypertension   . Pain    CHRONIC LBP    Past Surgical History:  Procedure Laterality Date  . BACK SURGERY    . CATARACT EXTRACTION W/PHACO Right 07/10/2017   Procedure: CATARACT EXTRACTION PHACO AND INTRAOCULAR LENS PLACEMENT (IOC);  Surgeon: Birder Robson, MD;  Location: [facility] ORS;  Service: Ophthalmology;  Laterality: Right;  [place] 00:29.2 AP% 16.5 CDE 4.83 Fluid Pack Lot # [id number]  . COLONOSCOPY    . EYE SURGERY    . HERNIA REPAIR    . TOTAL KNEE ARTHROPLASTY Left 03/19/2018   Procedure: TOTAL KNEE ARTHROPLASTY;  Surgeon: Thornton Park, MD;  Location: [facility] ORS;  Service: Orthopedics;  Laterality: Left;    Family History  Problem Relation Age of Onset  . Diabetes Father   . Breast cancer Sister     Social History   Socioeconomic History  . Marital status: Married    Spouse name: Not on file  . Number of children: Not on file  . Years of education: Not on file  . Highest education level: Not on file    Occupational History  . Not on file  Tobacco Use  . Smoking status: Never Smoker  . Smokeless tobacco: Never Used  Vaping Use  . Vaping Use: Never used  Substance and Sexual Activity  . Alcohol use: No  . Drug use: Never  . Sexual activity: Not on file  Other Topics Concern  . Not on file  Social History Narrative   Independent at baseline.  Lives at home with his wife   Social Determinants of Health   Financial Resource Strain:   . Difficulty of Paying Living Expenses: Not on file  Food Insecurity:   . Worried About Charity fundraiser in the Last Year: Not on file  . Ran Out of Food in the Last Year: Not on file  Transportation Needs:   . Lack of Transportation (Medical): Not on file  . Lack of Transportation (Non-Medical): Not on file  Physical Activity:   . Days of Exercise per Week: Not on file  . Minutes of Exercise per Session: Not on file  Stress:   . Feeling of Stress : Not on file  Social Connections:   . Frequency of Communication with Friends and Family: Not on file  . Frequency of Social Gatherings with Friends and Family: Not on file  . Attends Religious  Services: Not on file  . Active Member of Clubs or Organizations: Not on file  . Attends Archivist Meetings: Not on file  . Marital Status: Not on file  Intimate Partner Violence:   . Fear of Current or Ex-Partner: Not on file  . Emotionally Abused: Not on file  . Physically Abused: Not on file  . Sexually Abused: Not on file     Current Outpatient Medications:  .  aspirin EC 81 MG tablet, Take 81 mg by mouth daily., Disp: , Rfl:  .  cholecalciferol (VITAMIN D) 1000 units tablet, Take 1,000 Units by mouth daily., Disp: , Rfl:  .  dutasteride (AVODART) 0.5 MG capsule, Take 0.5 mg by mouth daily., Disp: , Rfl: 3 .  ferrous sulfate 325 (65 FE) MG tablet, Take 1 tablet (325 mg total) by mouth 2 (two) times daily with a meal., Disp: 60 tablet, Rfl: 3 .  lisinopril-hydrochlorothiazide  (PRINZIDE,ZESTORETIC) 20-12.5 MG tablet, Take 2 tablets by mouth daily., Disp: , Rfl:  .  lovastatin (MEVACOR) 40 MG tablet, Take 40 mg by mouth every morning., Disp: , Rfl:  .  metoprolol tartrate (LOPRESSOR) 50 MG tablet, TAKE 1 TABLET BY MOUTH EVERY 12 HOURS, Disp: 180 tablet, Rfl: 2 .  Misc Natural Products (PROSTATE SUPPORT PO), Take 2 capsules by mouth daily., Disp: , Rfl:  .  NOVOLOG FLEXPEN 100 UNIT/ML FlexPen, Inject 7 Units into the skin 3 (three) times daily before meals., Disp: , Rfl: 0 .  Omega-3 Fatty Acids (FISH OIL) 1000 MG CAPS, Take 1,000 mg by mouth daily. , Disp: , Rfl:  .  oxyCODONE (OXY IR/ROXICODONE) 5 MG immediate release tablet, Take 1 tablet (5 mg total) by mouth every 4 (four) hours as needed for moderate pain (pain score 4-6)., Disp: 40 tablet, Rfl: 0 .  tamsulosin (FLOMAX) 0.4 MG CAPS capsule, Take 0.4 mg by mouth daily after breakfast. , Disp: , Rfl:  .  TRESIBA FLEXTOUCH 200 UNIT/ML SOPN, Inject 80 Units into the muscle every morning., Disp: , Rfl: 2 .  TRULICITY 1.5 KV/4.2VZ SOPN, Inject 1.5 mg into the muscle every Wednesday. , Disp: , Rfl: 1   No Known Allergies  ROS Review of Systems  Constitutional: Negative.   HENT: Negative.   Eyes: Negative.   Respiratory: Negative.   Cardiovascular: Negative.   Gastrointestinal: Negative.   Endocrine: Negative.   Genitourinary: Negative.   Musculoskeletal: Positive for myalgias (R hip and buttock radiating down leg).  Skin: Negative.   Allergic/Immunologic: Negative.   Neurological: Negative.   Hematological: Negative.   Psychiatric/Behavioral: Negative.   All other systems reviewed and are negative.    OBJECTIVE:    Physical Exam Vitals reviewed.  Constitutional:      Appearance: Normal appearance. He is obese.  HENT:     Mouth/Throat:     Mouth: Mucous membranes are moist.  Eyes:     Pupils: Pupils are equal, round, and reactive to light.  Neck:     Vascular: No carotid bruit.  Cardiovascular:      Rate and Rhythm: Normal rate and regular rhythm.     Pulses: Normal pulses.     Heart sounds: Normal heart sounds.  Pulmonary:     Effort: Pulmonary effort is normal.     Breath sounds: Normal breath sounds.  Abdominal:     General: Bowel sounds are normal.     Palpations: Abdomen is soft. There is no hepatomegaly, splenomegaly or mass.     Tenderness: There  is no abdominal tenderness.     Hernia: No hernia is present.  Musculoskeletal:     Cervical back: Neck supple.     Right lower leg: 1+ Edema present.     Left lower leg: 1+ Edema present.     Comments: Evidence of history torn ligaments in L bicep  Skin:    Findings: No rash.  Neurological:     Mental Status: He is alert and oriented to person, place, and time.     Motor: No weakness.  Psychiatric:        Mood and Affect: Mood normal.        Behavior: Behavior normal.     BP 132/70   Pulse 77   Ht 6\' 2"  (1.88 m)   Wt 250 lb 9.6 oz (113.7 kg)   BMI 32.18 kg/m  Wt Readings from Last 3 Encounters:  02/18/20 250 lb 9.6 oz (113.7 kg)  12/15/19 249 lb 11.2 oz (113.3 kg)  10/13/19 248 lb 9.6 oz (112.8 kg)    Health Maintenance Due  Topic Date Due  . Hepatitis C Screening  Never done  . FOOT EXAM  Never done  . OPHTHALMOLOGY EXAM  Never done  . COVID-19 Vaccine (1) Never done  . TETANUS/TDAP  Never done  . HEMOGLOBIN A1C  09/05/2018  . PNA vac Low Risk Adult (2 of 2 - PCV13) 03/23/2019  . INFLUENZA VACCINE  12/07/2019    There are no preventive care reminders to display for this patient.  CBC Latest Ref Rng & Units 06/04/2018 03/24/2018 03/23/2018  WBC 4.0 - 10.5 K/uL 5.2 8.6 10.7(H)  Hemoglobin 13.0 - 17.0 g/dL 13.7 8.1(L) 7.8(L)  Hematocrit 39 - 52 % 42.9 23.7(L) 23.4(L)  Platelets 150 - 400 K/uL 209 178 158   CMP Latest Ref Rng & Units 06/04/2018 03/24/2018 03/23/2018  Glucose 70 - 99 mg/dL 162(H) 110(H) 161(H)  BUN 8 - 23 mg/dL 23 53(H) 58(H)  Creatinine 0.61 - 1.24 mg/dL 1.66(H) 2.13(H) 2.44(H)  Sodium  135 - 145 mmol/L 144 133(L) 132(L)  Potassium 3.5 - 5.1 mmol/L 4.4 4.8 4.8  Chloride 98 - 111 mmol/L 106 102 99  CO2 22 - 32 mmol/L 29 26 26   Calcium 8.9 - 10.3 mg/dL 9.5 7.8(L) 7.9(L)  Total Protein 6.5 - 8.1 g/dL 7.6 - -  Total Bilirubin 0.3 - 1.2 mg/dL 0.5 - -  Alkaline Phos 38 - 126 U/L 57 - -  AST 15 - 41 U/L 26 - -  ALT 0 - 44 U/L 24 - -    No results found for: TSH Lab Results  Component Value Date   ALBUMIN 4.3 06/04/2018   ANIONGAP 9 06/04/2018   No results found for: CHOL, HDL, LDLCALC, CHOLHDL No results found for: TRIG Lab Results  Component Value Date   HGBA1C 7.3 (H) 03/06/2018      ASSESSMENT & PLAN:   Problem List Items Addressed This Visit      Cardiovascular and Mediastinum   Essential hypertension    - Today, the patient's blood pressure is well managed on lisinopril. - The patient will continue the current treatment regimen.  - I encouraged the patient to eat a low-sodium diet to help control blood pressure. - I encouraged the patient to live an active lifestyle and complete activities that increases heart rate to 85% target heart rate at least 5 times per week for one hour.            Endocrine  Type 2 diabetes mellitus without complication, with long-term current use of insulin (HCC)    - The patient's blood sugar is under control on insulin and trulicity. - The patient will continue the current treatment regimen.  - I encouraged the patient to regularly check blood sugar.  - I encouraged the patient to monitor diet. I encouraged the patient to eat low-carb and low-sugar to help prevent blood sugar spikes.  - I encouraged the patient to continue following their prescribed treatment plan for diabetes - I informed the patient to get help if blood sugar drops below 54mg /dL, or if suddenly have trouble thinking clearly or breathing.            Other   S/P TKR (total knee replacement) using cement, left    Patient knee is doing good.  He has  1+ swelling of the both legs pedal pulses are okay.  He was advised to lose weight.      Class 1 obesity with serious comorbidity and body mass index (BMI) of 31.0 to 31.9 in adult    - I encouraged the patient to lose weight.  - I educated them on making healthy dietary choices including eating more fruits and vegetables and less fried foods. - I encouraged the patient to exercise more, and educated on the benefits of exercise including weight loss, diabetes management, and hypertension management.        Annual physical exam    Patient main problem at the present time is obesity diabetes and hypertension.  His blood pressure is under control his last hemoglobin AIC was . 7.3 we will check the hemoglobin AIC  again.  His prostate examination is done by Dr. Eliberto Ivory in the next 2 weeks which was not done.  His kidney function is borderline low so we will watch it carefully.      Need for influenza vaccination - Primary    Influenza vaccine was administered to the patient.      Relevant Orders   Flu Vaccine QUAD High Dose(Fluad)      No orders of the defined types were placed in this encounter.   Follow-up: No follow-ups on file.    Cletis Athens, MD [facility] [address] [place], [place] [address]   By signing my name below, I, [employer], attest that this documentation has been prepared under the direction and in the presence of Dr. Cletis Athens Electronically Signed: Cletis Athens, MD 02/18/20, 9:36 AM I personally performed the services described in this documentation, which was SCRIBED in my presence. The recorded information has been reviewed and considered accurate. It has been edited as necessary during review. Cletis Athens, MD

## 2020-02-18 NOTE — Patient Instructions (Signed)
Exercising to Lose Weight  Exercise is structured, repetitive physical activity to improve fitness and health. Getting regular exercise is important for everyone. It is especially important if you are overweight. Being overweight increases your risk of heart disease, stroke, diabetes, high blood pressure, and several types of cancer. Reducing your calorie intake and exercising can help you lose weight. Exercise is usually categorized as moderate or vigorous intensity. To lose weight, most people need to do a certain amount of moderate-intensity or vigorous-intensity exercise each week.  Moderate-intensity exercise   Moderate-intensity exercise is any activity that gets you moving enough to burn at least three times more energy (calories) than if you were sitting. Examples of moderate exercise include:  Walking a mile in 15 minutes.  Doing light yard work.  Biking at an easy pace. Most people should get at least 150 minutes (2 hours and 30 minutes) a week of moderate-intensity exercise to maintain their body weight.  Vigorous-intensity exercise Vigorous-intensity exercise is any activity that gets you moving enough to burn at least six times more calories than if you were sitting. When you exercise at this intensity, you should be working hard enough that you are not able to carry on a conversation. Examples of vigorous exercise include:  Running.  Playing a team sport, such as football, basketball, and soccer.  Jumping rope. Most people should get at least 75 minutes (1 hour and 15 minutes) a week of vigorous-intensity exercise to maintain their body weight.  How can exercise affect me? When you exercise enough to burn more calories than you eat, you lose weight. Exercise also reduces body fat and builds muscle. The more muscle you have, the more calories you burn. Exercise also:  Improves mood.  Reduces stress and tension.  Improves your overall fitness, flexibility, and  endurance.  Increases bone strength.  The amount of exercise you need to lose weight depends on:  Your age.  The type of exercise.  Any health conditions you have.  Your overall physical ability. Talk to your health care provider about how much exercise you need and what types of activities are safe for you.  What actions can I take to lose weight? Nutrition  1. Make changes to your diet as told by your health care provider or diet and nutrition specialist (dietitian). This may include: ? Eating fewer calories. ? Eating more protein. ? Eating less unhealthy fats. ? Eating a diet that includes fresh fruits and vegetables, whole grains, low-fat dairy products, and lean protein. ? Avoiding foods with added fat, salt, and sugar. 2. Drink plenty of water while you exercise to prevent dehydration or heat stroke.  Activity 1. Choose an activity that you enjoy and set realistic goals. Your health care provider can help you make an exercise plan that works for you. 2. Exercise at a moderate or vigorous intensity most days of the week. ? The intensity of exercise may vary from person to person. You can tell how intense a workout is for you by paying attention to your breathing and heartbeat. Most people will notice their breathing and heartbeat get faster with more intense exercise. 3. Do resistance training twice each week, such as: ? Push-ups. ? Sit-ups. ? Lifting weights. ? Using resistance bands. 4. Getting short amounts of exercise can be just as helpful as long structured periods of exercise. If you have trouble finding time to exercise, try to include exercise in your daily routine. ? Get up, stretch, and walk around every 30  minutes throughout the day. ? Go for a walk during your lunch break. ? Park your car farther away from your destination. ? If you take public transportation, get off one stop early and walk the rest of the way. ? Make phone calls while standing up and  walking around. ? Take the stairs instead of elevators or escalators. 5. Wear comfortable clothes and shoes with good support. 6. Do not exercise so much that you hurt yourself, feel dizzy, or get very short of breath.  Where to find more information  [place] Department of Health and Human Services: [employer]at  Centers for Disease Control and Prevention (CDC): http://www.wolf.info/  Contact a health care provider:  Before starting a new exercise program.  If you have questions or concerns about your weight.  If you have a medical problem that keeps you from exercising.  Get help right away if you have any of the following while exercising:  Injury.  Dizziness.  Difficulty breathing or shortness of breath that does not go away when you stop exercising.  Chest pain.  Rapid heartbeat.  Summary  Being overweight increases your risk of heart disease, stroke, diabetes, high blood pressure, and several types of cancer.  Losing weight happens when you burn more calories than you eat.  Reducing the amount of calories you eat in addition to getting regular moderate or vigorous exercise each week helps you lose weight.  This information is not intended to replace advice given to you by your health care provider. Make sure you discuss any questions you have with your health care provider. Document Revised: 05/07/2017 Document Reviewed: 05/07/2017 Elsevier Patient Education  2020 [place] [employer]  Calories are units of energy. Your body needs a certain amount of calories from food to keep you going throughout the day. When you eat more calories than your body needs, your body stores the extra calories as fat. When you eat fewer calories than your body needs, your body burns fat to get the energy it needs.  Calorie counting means keeping track of how many calories you eat and drink each day. Calorie counting can be helpful if you need to lose weight. If you make  sure to eat fewer calories than your body needs, you should lose weight. Ask your health care provider what a healthy weight is for you.  For calorie counting to work, you will need to eat the right number of calories in a day in order to lose a healthy amount of weight per week. A dietitian can help you determine how many calories you need in a day and will give you suggestions on how to reach your calorie goal.  A healthy amount of weight to lose per week is usually 1-2 lb (0.5-0.9 kg). This usually means that your daily calorie intake should be reduced by 500-750 calories.  Eating 1,200 - 1,500 calories per day can help most women lose weight.  Eating 1,500 - 1,800 calories per day can help most men lose weight.  What is my plan? My goal is to have __________ calories per day. If I have this many calories per day, I should lose around __________ pounds per week.  What do I need to know about calorie counting? In order to meet your daily calorie goal, you will need to:  Find out how many calories are in each food you would like to eat. Try to do this before you eat.  Decide how much of  the food you plan to eat.  Write down what you ate and how many calories it had. Doing this is called keeping a food log.  To successfully lose weight, it is important to balance calorie counting with a healthy lifestyle that includes regular activity. Aim for 150 minutes of moderate exercise (such as walking) or 75 minutes of vigorous exercise (such as running) each week.  Where do I find calorie information?  The number of calories in a food can be found on a Nutrition Facts label. If a food does not have a Nutrition Facts label, try to look up the calories online or ask your dietitian for help. Remember that calories are listed per serving. If you choose to have more than one serving of a food, you will have to multiply the calories per serving by the amount of servings you plan to eat. For example,  the label on a package of bread might say that a serving size is 1 slice and that there are 90 calories in a serving. If you eat 1 slice, you will have eaten 90 calories. If you eat 2 slices, you will have eaten 180 calories.  How do I keep a food log? Immediately after each meal, record the following information in your food log:  What you ate. Don't forget to include toppings, sauces, and other extras on the food.  How much you ate. This can be measured in cups, ounces, or number of items.  How many calories each food and drink had.  The total number of calories in the meal.  Keep your food log near you, such as in a small notebook in your pocket, or use a mobile app or website. Some programs will calculate calories for you and show you how many calories you have left for the day to meet your goal.  What are some calorie counting tips?  3. Use your calories on foods and drinks that will fill you up and not leave you hungry: ? Some examples of foods that fill you up are nuts and nut butters, vegetables, lean proteins, and high-fiber foods like whole grains. High-fiber foods are foods with more than 5 g fiber per serving. ? Drinks such as sodas, specialty coffee drinks, alcohol, and juices have a lot of calories, yet do not fill you up. 4. Eat nutritious foods and avoid empty calories. Empty calories are calories you get from foods or beverages that do not have many vitamins or protein, such as candy, sweets, and soda. It is better to have a nutritious high-calorie food (such as an avocado) than a food with few nutrients (such as a bag of chips). 5. Know how many calories are in the foods you eat most often. This will help you calculate calorie counts faster. 6. Pay attention to calories in drinks. Low-calorie drinks include water and unsweetened drinks. 7. Pay attention to nutrition labels for "low fat" or "fat free" foods. These foods sometimes have the same amount of calories or more  calories than the full fat versions. They also often have added sugar, starch, or salt, to make up for flavor that was removed with the fat. 8. Find a way of tracking calories that works for you. Get creative. Try different apps or programs if writing down calories does not work for you.  What are some portion control tips?  Know how many calories are in a serving. This will help you know how many servings of a certain food you can have.  Use a measuring cup to measure serving sizes. You could also try weighing out portions on a kitchen scale. With time, you will be able to estimate serving sizes for some foods.  Take some time to put servings of different foods on your favorite plates, bowls, and cups so you know what a serving looks like.  Try not to eat straight from a bag or box. Doing this can lead to overeating. Put the amount you would like to eat in a cup or on a plate to make sure you are eating the right portion.  Use smaller plates, glasses, and bowls to prevent overeating.  Try not to multitask (for example, watch TV or use your computer) while eating. If it is time to eat, sit down at a table and enjoy your food. This will help you to know when you are full. It will also help you to be aware of what you are eating and how much you are eating.  What are tips for following this plan? Reading food labels  Check the calorie count compared to the serving size. The serving size may be smaller than what you are used to eating.  Check the source of the calories. Make sure the food you are eating is high in vitamins and protein and low in saturated and trans fats.  Shopping  Read nutrition labels while you shop. This will help you make healthy decisions before you decide to purchase your food.  Make a grocery list and stick to it.  Cooking  Try to cook your favorite foods in a healthier way. For example, try baking instead of frying.  Use low-fat dairy products.  Meal  planning  Use more fruits and vegetables. Half of your plate should be fruits and vegetables.  Include lean proteins like poultry and fish.  How do I count calories when eating out? 7. Ask for smaller portion sizes. 8. Consider sharing an entree and sides instead of getting your own entree. 9. If you get your own entree, eat only half. Ask for a box at the beginning of your meal and put the rest of your entree in it so you are not tempted to eat it. 10. If calories are listed on the menu, choose the lower calorie options. 11. Choose dishes that include vegetables, fruits, whole grains, low-fat dairy products, and lean protein. 12. Choose items that are boiled, broiled, grilled, or steamed. Stay away from items that are buttered, battered, fried, or served with cream sauce. Items labeled "crispy" are usually fried, unless stated otherwise. 13. Choose water, low-fat milk, unsweetened iced tea, or other drinks without added sugar. If you want an alcoholic beverage, choose a lower calorie option such as a glass of wine or light beer. 14. Ask for dressings, sauces, and syrups on the side. These are usually high in calories, so you should limit the amount you eat. 15. If you want a salad, choose a garden salad and ask for grilled meats. Avoid extra toppings like bacon, cheese, or fried items. Ask for the dressing on the side, or ask for olive oil and vinegar or lemon to use as dressing. 16. Estimate how many servings of a food you are given. For example, a serving of cooked rice is  cup or about the size of half a baseball. Knowing serving sizes will help you be aware of how much food you are eating at restaurants. The list below tells you how big or small some common portion sizes are  based on everyday objects: ? 1 oz--4 stacked dice. ? 3 oz--1 deck of cards. ? 1 tsp--1 die. ? 1 Tbsp-- a ping-pong ball. ? 2 Tbsp--1 ping-pong ball. ?  cup-- baseball. ? 1 cup--1 baseball.  Summary  Calorie  counting means keeping track of how many calories you eat and drink each day. If you eat fewer calories than your body needs, you should lose weight.  A healthy amount of weight to lose per week is usually 1-2 lb (0.5-0.9 kg). This usually means reducing your daily calorie intake by 500-750 calories.  The number of calories in a food can be found on a Nutrition Facts label. If a food does not have a Nutrition Facts label, try to look up the calories online or ask your dietitian for help.  Use your calories on foods and drinks that will fill you up, and not on foods and drinks that will leave you hungry.  Use smaller plates, glasses, and bowls to prevent overeating.  This information is not intended to replace advice given to you by your health care provider. Make sure you discuss any questions you have with your health care provider. Document Revised: 01/11/2018 Document Reviewed: 03/24/2016 Elsevier Patient Education  [place].

## 2020-02-18 NOTE — Assessment & Plan Note (Signed)
-   I encouraged the patient to lose weight.  - I educated them on making healthy dietary choices including eating more fruits and vegetables and less fried foods. - I encouraged the patient to exercise more, and educated on the benefits of exercise including weight loss, diabetes management, and hypertension management.   

## 2020-02-18 NOTE — Assessment & Plan Note (Signed)
Influenza vaccine was administered to the patient.

## 2020-02-18 NOTE — Assessment & Plan Note (Signed)
-   The patient's blood sugar is under control on insulin and trulicity. - The patient will continue the current treatment regimen.  - I encouraged the patient to regularly check blood sugar.  - I encouraged the patient to monitor diet. I encouraged the patient to eat low-carb and low-sugar to help prevent blood sugar spikes.  - I encouraged the patient to continue following their prescribed treatment plan for diabetes - I informed the patient to get help if blood sugar drops below 54mg /dL, or if suddenly have trouble thinking clearly or breathing.

## 2020-02-18 NOTE — Assessment & Plan Note (Signed)
-   Today, the patient's blood pressure is well managed on lisinopril. - The patient will continue the current treatment regimen.  - I encouraged the patient to eat a low-sodium diet to help control blood pressure. - I encouraged the patient to live an active lifestyle and complete activities that increases heart rate to 85% target heart rate at least 5 times per week for one hour.     

## 2020-02-18 NOTE — Assessment & Plan Note (Signed)
Patient main problem at the present time is obesity diabetes and hypertension.  His blood pressure is under control his last hemoglobin AIC was . 7.3 we will check the hemoglobin AIC  again.  His prostate examination is done by Dr. Eliberto Ivory in the next 2 weeks which was not done.  His kidney function is borderline low so we will watch it carefully.

## 2020-03-01 ENCOUNTER — Other Ambulatory Visit: Payer: Self-pay | Admitting: Urology

## 2020-03-01 DIAGNOSIS — R972 Elevated prostate specific antigen [PSA]: Secondary | ICD-10-CM

## 2020-03-17 ENCOUNTER — Other Ambulatory Visit: Payer: Self-pay

## 2020-03-17 ENCOUNTER — Ambulatory Visit
Admission: RE | Admit: 2020-03-17 | Discharge: 2020-03-17 | Disposition: A | Discharge status: Home / Self Care | Payer: Medicare HMO | Source: Ambulatory Visit | Attending: Urology | Admitting: Urology

## 2020-03-17 DIAGNOSIS — R972 Elevated prostate specific antigen [PSA]: Secondary | ICD-10-CM | POA: Diagnosis not present

## 2020-03-17 MED ORDER — GADOBUTROL 1 MMOL/ML IV SOLN
10.0000 mL | Freq: Once | INTRAVENOUS | Status: AC | PRN
  Administered 2020-03-17: 10 mL via INTRAVENOUS

## 2020-03-29 ENCOUNTER — Encounter
Admission: RE | Admit: 2020-03-29 | Discharge: 2020-03-29 | Disposition: A | Discharge status: Home / Self Care | Payer: Medicare HMO | Source: Ambulatory Visit | Attending: Urology | Admitting: Urology

## 2020-03-29 ENCOUNTER — Other Ambulatory Visit: Payer: Self-pay

## 2020-03-29 HISTORY — DX: Chronic kidney disease, unspecified: N18.9

## 2020-03-29 HISTORY — DX: Unilateral inguinal hernia, without obstruction or gangrene, not specified as recurrent: K40.90

## 2020-03-29 NOTE — Patient Instructions (Addendum)
Your procedure is scheduled on: Thursday, December 2 Report to the Registration Desk on the 1st floor of the [facility]. To find out your arrival time, please call [phone] between 1PM - 3PM on: Wednesday, December 1  REMEMBER: Instructions that are not followed completely may result in serious medical risk, up to and including death; or upon the discretion of your surgeon and anesthesiologist your surgery may need to be rescheduled.  Do not eat food after midnight the night before surgery.  No gum chewing, lozengers or hard candies.  You may however, drink water up to 2 hours before you are scheduled to arrive for your surgery. Do not drink anything within 2 hours of your scheduled arrival time.  TAKE THESE MEDICATIONS THE MORNING OF SURGERY WITH A SIP OF WATER:  1.  Amlodipine 2.  Dutasteride (Avodart) 3.  Metoprolol 4.  Tamsulosin (Flomax)  Do NOT take ANY insulin on the morning of surgery.  One week prior to surgery: Starting November 25 Stop ASPIRIN AND Anti-inflammatories (NSAIDS) such as Advil, Aleve, Ibuprofen, Motrin, Naproxen, Naprosyn and Aspirin based products such as Excedrin, Goodys Powder, BC Powder. Stop ANY OVER THE COUNTER supplements until after surgery. STOP FISH OIL, PROSTATE SUPPORT (However, you may continue taking Vitamin D, Vitamin B up until the day before surgery.)  No Alcohol for 24 hours before or after surgery.  On the morning of surgery brush your teeth with toothpaste and water, you may rinse your mouth with mouthwash if you wish. Do not swallow any toothpaste or mouthwash.  Do not wear jewelry.  Do not wear lotions, powders, or perfumes.   Hearing aids and dentures may not be worn into surgery.  Do not bring valuables to the hospital. [facility] is not responsible for any missing/lost belongings or valuables.   Fleets enema as directed AT [facility].  Notify your doctor if there is any change in your medical  condition (cold, fever, infection).  Wear comfortable clothing (specific to your surgery type) to the hospital.  If you are being discharged the day of surgery, you will not be allowed to drive home. You will need a responsible adult (18 years or older) to drive you home and stay with you that night.   If you are taking public transportation, you will need to have a responsible adult (18 years or older) with you. Please confirm with your physician that it is acceptable to use public transportation.   Please call the [place] Dept. at [phone] if you have any questions about these instructions.  Visitation Policy:  Patients undergoing a surgery or procedure may have one family member or support person with them as long as that person is not COVID-19 positive or experiencing its symptoms.  That person may remain in the waiting area during the procedure.

## 2020-03-30 ENCOUNTER — Encounter
Admission: RE | Admit: 2020-03-30 | Discharge: 2020-03-30 | Disposition: A | Discharge status: Home / Self Care | Payer: Medicare HMO | Source: Ambulatory Visit | Attending: Urology | Admitting: Urology

## 2020-03-30 DIAGNOSIS — I10 Essential (primary) hypertension: Secondary | ICD-10-CM | POA: Diagnosis not present

## 2020-03-30 DIAGNOSIS — E119 Type 2 diabetes mellitus without complications: Secondary | ICD-10-CM | POA: Insufficient documentation

## 2020-03-30 DIAGNOSIS — I440 Atrioventricular block, first degree: Secondary | ICD-10-CM | POA: Diagnosis not present

## 2020-03-30 DIAGNOSIS — Z01818 Encounter for other preprocedural examination: Secondary | ICD-10-CM | POA: Insufficient documentation

## 2020-03-30 LAB — CBC
HCT: 40.6 % (ref 39.0–52.0)
Hemoglobin: 13.5 g/dL (ref 13.0–17.0)
MCH: 27.9 pg (ref 26.0–34.0)
MCHC: 33.3 g/dL (ref 30.0–36.0)
MCV: 83.9 fL (ref 80.0–100.0)
Platelets: 172 10*3/uL (ref 150–400)
RBC: 4.84 MIL/uL (ref 4.22–5.81)
RDW: 13.3 % (ref 11.5–15.5)
WBC: 4.9 10*3/uL (ref 4.0–10.5)
nRBC: 0 % (ref 0.0–0.2)

## 2020-03-30 LAB — BASIC METABOLIC PANEL WITH GFR
Anion gap: 10 (ref 5–15)
BUN: 26 mg/dL — ABNORMAL HIGH (ref 8–23)
CO2: 27 mmol/L (ref 22–32)
Calcium: 9.6 mg/dL (ref 8.9–10.3)
Chloride: 104 mmol/L (ref 98–111)
Creatinine, Ser: 1.74 mg/dL — ABNORMAL HIGH (ref 0.61–1.24)
GFR, Estimated: 40 mL/min — ABNORMAL LOW
Glucose, Bld: 135 mg/dL — ABNORMAL HIGH (ref 70–99)
Potassium: 3.9 mmol/L (ref 3.5–5.1)
Sodium: 141 mmol/L (ref 135–145)

## 2020-03-31 NOTE — H&P (Addendum)
NAMEJARET, Keith Howe MEDICAL RECORD RX:[id number] ACCOUNT [id number] DATE OF BIRTH:09-12-1940 FACILITY: [facility] LOCATION: [place], [place]  HISTORY AND PHYSICAL  DATE OF ADMISSION:  04/08/2020  Same day surgery, 12/02.  CHIEF COMPLAINT:  Elevated PSA and abnormal prostate MRI scan.  HISTORY OF PRESENT ILLNESS:  The patient is a 79 year old African-American male with a history of HGPIN and a recent increase of his PSA up to 5.7 ng/mL on 10/19th.  This was subsequently evaluated with MRI scan, which revealed PI-RADS category 5 lesion, which is region of interest #1 involving the left base.  He also had bulging of the left seminal vesicle suspicious for invasion.   PAST MEDICAL HISTORY: ALLERGIES:  No drug allergies.  CURRENT MEDICATIONS:  Included aspirin, dutasteride, iron sulfate, lisinopril, HCTZ, lovastatin, metoprolol, prostate supplement, insulin, omega 3 fish oil, oxycodone, tamsulosin, Tresiba and Trulicity.  PAST SURGICAL HISTORY:  Lumbar spine surgery 2016; left total knee replacement in 2019; left inguinal herniorrhaphy 2005; bilateral cataract surgery 2019 and colonoscopy.  PAST AND CURRENT MEDICAL CONDITIONS: 1.  Diabetes. 2.  Hypertension. 3.  Chronic low back pain.  REVIEW OF SYSTEMS:  The patient denies chest pain, shortness of breath, stroke or heart disease.  SOCIAL HISTORY:  The patient denied tobacco or alcohol use.  FAMILY HISTORY:  Negative for prostate cancer.  PHYSICAL EXAMINATION: GENERAL:  Well-nourished African-American male in no acute distress. HEENT:  Sclerae were clear.  Pupils are equally round, reactive to light and accommodation. NECK:  No palpable thyroid nodules, no audible carotid bruits. LYMPHATIC:  No palpable cervical or inguinal adenopathy. PULMONARY:  Lungs clear to auscultation. CARDIOVASCULAR:  Regular rhythm and rate without audible murmurs. ABDOMEN:  Soft, nontender abdomen. GENITOURINARY:   Circumcised, testes smooth, nontender. RECTAL:  A 35 gram smooth, nontender prostate. NEUROMUSCULAR:  Alert and oriented x3.  IMPRESSION: 1.  Elevated PSA. 2.  PI-RADS category 5 lesion, left base. 3.  PI-RADS category lesion, right peripheral zone. 4.  Benign prostatic hypertrophy with prostate volume of 71.5 mL.  PLAN:  UroNav fusion biopsy of the prostate.  HN/NUANCE  D:03/30/2020 T:03/30/2020 JOB:[id number]

## 2020-04-06 ENCOUNTER — Other Ambulatory Visit
Admission: RE | Admit: 2020-04-06 | Discharge: 2020-04-06 | Disposition: A | Discharge status: Home / Self Care | Payer: Medicare HMO | Source: Ambulatory Visit | Attending: Urology | Admitting: Urology

## 2020-04-06 ENCOUNTER — Other Ambulatory Visit: Payer: Self-pay

## 2020-04-06 DIAGNOSIS — Z20822 Contact with and (suspected) exposure to covid-19: Secondary | ICD-10-CM | POA: Insufficient documentation

## 2020-04-06 DIAGNOSIS — Z01812 Encounter for preprocedural laboratory examination: Secondary | ICD-10-CM | POA: Diagnosis present

## 2020-04-07 LAB — SARS CORONAVIRUS 2 (TAT 6-24 HRS): SARS Coronavirus 2: NEGATIVE

## 2020-04-07 MED ORDER — GENTAMICIN IN SALINE 1.6-0.9 MG/ML-% IV SOLN
80.0000 mg | INTRAVENOUS | Status: AC
  Administered 2020-04-08: 80 mg via INTRAVENOUS
  Filled 2020-04-07: qty 50

## 2020-04-07 MED ORDER — FLEET ENEMA 7-19 GM/118ML RE ENEM
1.0000 | ENEMA | Freq: Once | RECTAL | Status: AC
  Administered 2020-04-08: 1 via RECTAL

## 2020-04-07 MED ORDER — CEFAZOLIN SODIUM-DEXTROSE 1-4 GM/50ML-% IV SOLN
1.0000 g | Freq: Once | INTRAVENOUS | Status: AC
  Administered 2020-04-08: 1 g via INTRAVENOUS

## 2020-04-07 MED ORDER — SODIUM CHLORIDE 0.9 % IV SOLN: INTRAVENOUS | Status: DC

## 2020-04-07 MED ORDER — ORAL CARE MOUTH RINSE: 15.0000 mL | Freq: Once | OROMUCOSAL | Status: AC

## 2020-04-07 MED ORDER — CHLORHEXIDINE GLUCONATE 0.12 % MT SOLN: 15.0000 mL | Freq: Once | OROMUCOSAL | Status: AC

## 2020-04-07 MED ORDER — FAMOTIDINE 20 MG PO TABS: 20.0000 mg | ORAL_TABLET | Freq: Once | ORAL | Status: AC

## 2020-04-07 MED ORDER — GENTAMICIN SULFATE 40 MG/ML IJ SOLN
80.0000 mg | Freq: Once | INTRAVENOUS | Status: DC
  Filled 2020-04-07: qty 2

## 2020-04-08 ENCOUNTER — Encounter
Admission: RE | Disposition: A | Discharge status: Home / Self Care | Payer: Self-pay | Source: Home / Self Care | Attending: Urology

## 2020-04-08 ENCOUNTER — Other Ambulatory Visit: Payer: Self-pay

## 2020-04-08 ENCOUNTER — Encounter: Payer: Self-pay | Admitting: Urology

## 2020-04-08 ENCOUNTER — Ambulatory Visit: Payer: Medicare HMO | Admitting: Certified Registered"

## 2020-04-08 ENCOUNTER — Ambulatory Visit
Admission: RE | Admit: 2020-04-08 | Discharge: 2020-04-08 | Disposition: A | Discharge status: Home / Self Care | Payer: Medicare HMO | Attending: Urology | Admitting: Urology

## 2020-04-08 DIAGNOSIS — R972 Elevated prostate specific antigen [PSA]: Secondary | ICD-10-CM | POA: Diagnosis present

## 2020-04-08 DIAGNOSIS — Z7982 Long term (current) use of aspirin: Secondary | ICD-10-CM | POA: Diagnosis not present

## 2020-04-08 DIAGNOSIS — N400 Enlarged prostate without lower urinary tract symptoms: Secondary | ICD-10-CM | POA: Insufficient documentation

## 2020-04-08 DIAGNOSIS — Z794 Long term (current) use of insulin: Secondary | ICD-10-CM | POA: Insufficient documentation

## 2020-04-08 DIAGNOSIS — C61 Malignant neoplasm of prostate: Secondary | ICD-10-CM | POA: Insufficient documentation

## 2020-04-08 DIAGNOSIS — Z79899 Other long term (current) drug therapy: Secondary | ICD-10-CM | POA: Diagnosis not present

## 2020-04-08 DIAGNOSIS — R9389 Abnormal findings on diagnostic imaging of other specified body structures: Secondary | ICD-10-CM | POA: Diagnosis present

## 2020-04-08 HISTORY — PX: PROSTATE BIOPSY: SHX241

## 2020-04-08 SURGERY — BIOPSY, PROSTATE
Anesthesia: General

## 2020-04-08 MED ORDER — FENTANYL CITRATE (PF) 250 MCG/5ML IJ SOLN
INTRAMUSCULAR | Status: AC
  Filled 2020-04-08: qty 5

## 2020-04-08 MED ORDER — GLYCOPYRROLATE 0.2 MG/ML IJ SOLN
INTRAMUSCULAR | Status: DC | PRN
  Administered 2020-04-08: .1 mg via INTRAVENOUS

## 2020-04-08 MED ORDER — PHENYLEPHRINE HCL (PRESSORS) 10 MG/ML IV SOLN
INTRAVENOUS | Status: DC | PRN
  Administered 2020-04-08 (×6): 100 ug via INTRAVENOUS

## 2020-04-08 MED ORDER — DEXMEDETOMIDINE (PRECEDEX) IN NS 20 MCG/5ML (4 MCG/ML) IV SYRINGE
PREFILLED_SYRINGE | INTRAVENOUS | Status: DC | PRN
  Administered 2020-04-08 (×2): 4 ug via INTRAVENOUS

## 2020-04-08 MED ORDER — LEVOFLOXACIN 500 MG PO TABS: 500.0000 mg | ORAL_TABLET | Freq: Every day | ORAL | 0 refills | Status: DC

## 2020-04-08 MED ORDER — PHENYLEPHRINE HCL (PRESSORS) 10 MG/ML IV SOLN
INTRAVENOUS | Status: AC
  Filled 2020-04-08: qty 1

## 2020-04-08 MED ORDER — DEXAMETHASONE SODIUM PHOSPHATE 10 MG/ML IJ SOLN
INTRAMUSCULAR | Status: AC
  Filled 2020-04-08: qty 1

## 2020-04-08 MED ORDER — MIDAZOLAM HCL 2 MG/2ML IJ SOLN
INTRAMUSCULAR | Status: AC
  Filled 2020-04-08: qty 2

## 2020-04-08 MED ORDER — LIDOCAINE HCL (PF) 2 % IJ SOLN
INTRAMUSCULAR | Status: DC | PRN
  Administered 2020-04-08: 100 mg via INTRADERMAL

## 2020-04-08 MED ORDER — CEFAZOLIN SODIUM-DEXTROSE 1-4 GM/50ML-% IV SOLN
INTRAVENOUS | Status: AC
  Filled 2020-04-08: qty 50

## 2020-04-08 MED ORDER — GLYCOPYRROLATE 0.2 MG/ML IJ SOLN
INTRAMUSCULAR | Status: AC
  Filled 2020-04-08: qty 1

## 2020-04-08 MED ORDER — FENTANYL CITRATE (PF) 100 MCG/2ML IJ SOLN
INTRAMUSCULAR | Status: DC | PRN
  Administered 2020-04-08: 25 ug via INTRAVENOUS
  Administered 2020-04-08: 50 ug via INTRAVENOUS
  Administered 2020-04-08: 25 ug via INTRAVENOUS

## 2020-04-08 MED ORDER — EPHEDRINE SULFATE 50 MG/ML IJ SOLN
INTRAMUSCULAR | Status: DC | PRN
  Administered 2020-04-08 (×3): 10 mg via INTRAVENOUS

## 2020-04-08 MED ORDER — PROPOFOL 10 MG/ML IV BOLUS
INTRAVENOUS | Status: DC | PRN
  Administered 2020-04-08 (×2): 100 mg via INTRAVENOUS

## 2020-04-08 MED ORDER — LIDOCAINE HCL (PF) 2 % IJ SOLN
INTRAMUSCULAR | Status: AC
  Filled 2020-04-08: qty 5

## 2020-04-08 MED ORDER — CHLORHEXIDINE GLUCONATE 0.12 % MT SOLN
OROMUCOSAL | Status: AC
  Administered 2020-04-08: 15 mL via OROMUCOSAL
  Filled 2020-04-08: qty 15

## 2020-04-08 MED ORDER — SUCCINYLCHOLINE CHLORIDE 200 MG/10ML IV SOSY
PREFILLED_SYRINGE | INTRAVENOUS | Status: AC
  Filled 2020-04-08: qty 10

## 2020-04-08 MED ORDER — PROPOFOL 10 MG/ML IV BOLUS
INTRAVENOUS | Status: AC
  Filled 2020-04-08: qty 20

## 2020-04-08 MED ORDER — MIDAZOLAM HCL 5 MG/5ML IJ SOLN
INTRAMUSCULAR | Status: DC | PRN
  Administered 2020-04-08 (×2): 1 mg via INTRAVENOUS

## 2020-04-08 MED ORDER — ONDANSETRON HCL 4 MG/2ML IJ SOLN
INTRAMUSCULAR | Status: DC | PRN
  Administered 2020-04-08: 4 mg via INTRAVENOUS

## 2020-04-08 MED ORDER — ONDANSETRON HCL 4 MG/2ML IJ SOLN
INTRAMUSCULAR | Status: AC
  Filled 2020-04-08: qty 2

## 2020-04-08 MED ORDER — ONDANSETRON HCL 4 MG/2ML IJ SOLN: 4.0000 mg | Freq: Once | INTRAMUSCULAR | Status: DC | PRN

## 2020-04-08 MED ORDER — FENTANYL CITRATE (PF) 100 MCG/2ML IJ SOLN: 25.0000 ug | INTRAMUSCULAR | Status: DC | PRN

## 2020-04-08 MED ORDER — FAMOTIDINE 20 MG PO TABS
ORAL_TABLET | ORAL | Status: AC
  Administered 2020-04-08: 20 mg via ORAL
  Filled 2020-04-08: qty 1

## 2020-04-08 MED ORDER — DEXAMETHASONE SODIUM PHOSPHATE 10 MG/ML IJ SOLN
INTRAMUSCULAR | Status: DC | PRN
  Administered 2020-04-08: 4 mg via INTRAVENOUS

## 2020-04-08 MED ORDER — ROCURONIUM BROMIDE 10 MG/ML (PF) SYRINGE
PREFILLED_SYRINGE | INTRAVENOUS | Status: AC
  Filled 2020-04-08: qty 10

## 2020-04-08 MED ORDER — LACTATED RINGERS IV SOLN: INTRAVENOUS | Status: DC | PRN

## 2020-04-08 SURGICAL SUPPLY — 20 items
COVER MAYO STAND REUSABLE (DRAPES) ×2 IMPLANT
COVER TRANSDUCER ULTRASOUND (MISCELLANEOUS) ×1 IMPLANT
GLOVE BIO SURGEON STRL SZ7 (GLOVE) ×4 IMPLANT
GUIDE NDL ENDOCAV 16-18 CVR (NEEDLE) IMPLANT
GUIDE NDL URONAV ULTRASND S (MISCELLANEOUS) IMPLANT
GUIDE NEEDLE ENDOCAV 16-18 CVR (NEEDLE) IMPLANT
GUIDE NEEDLE URONAV ULTRASND S (MISCELLANEOUS) ×1 IMPLANT
INST BIOPSY MAXCORE 18GX25 (NEEDLE) ×2 IMPLANT
MANIFOLD NEPTUNE II (INSTRUMENTS) ×2 IMPLANT
NDL GUIDE BIOPSY 644068 (NEEDLE) IMPLANT
NEEDLE GUIDE BIOPSY 644068 (NEEDLE) IMPLANT
PROBE BIOSP ALOKA ALPHA6 PROST (MISCELLANEOUS) ×1 IMPLANT
PROBE URONAV BK 8808E 8818 HLD (MISCELLANEOUS) IMPLANT
STRAP SAFETY BODY (MISCELLANEOUS) ×2 IMPLANT
SURGILUBE 2OZ TUBE FLIPTOP (MISCELLANEOUS) ×2 IMPLANT
TOWEL OR 17X26 4PK STRL BLUE (TOWEL DISPOSABLE) ×2 IMPLANT
URONAV BK 8808E 8818 PROBE HLD (MISCELLANEOUS) ×2
URONAV MRI FUSION TWO PATIENTS (MISCELLANEOUS) ×1 IMPLANT
URONAV ULTRASOUND (MISCELLANEOUS) ×1 IMPLANT
URONAV ULTRASOUND NDL GUIDE S (MISCELLANEOUS) ×2

## 2020-04-08 NOTE — Op Note (Signed)
Preoperative diagnosis: 1.  Elevated PSA (R97.2)                                             2.  Abnormal prostate gland MRI (D40.0)  Postoperative diagnosis: Same  Procedure: Uronav fusion biopsy of the prostate (CPT [place], [phone])  Surgeon: Otelia Limes. Yves Dill MD  Anesthesia: General  Indications:See the history and physical. After informed consent the above procedure(s) were requested     Technique and findings: After adequate general anesthesia been obtained patient was placed in the left lateral decubitus position and DRE was performed.  Rectal vault was noted to be clear.  He did have a palpable left prostatic nodule.  Ultrasound images were then acquired and fused with the MRI images.  Region of interest #1 was identified and 3 core biopsies taken here.  The left seminal vesicle was also bulging and 3 core biopsies were taken here next.  At this point standard 12 core systematic core biopsies were obtained.  Blood loss was minimal.  The procedure was then terminated and patient transferred to the recovery room in stable condition.

## 2020-04-08 NOTE — H&P (Signed)
Date of Initial H&P: 03/30/20  History reviewed, patient examined, no change in status, stable for surgery.  

## 2020-04-08 NOTE — Progress Notes (Signed)
Pt tolerating PO fluids.  NAD, VSS.  Denies pain

## 2020-04-08 NOTE — Discharge Instructions (Addendum)
Transrectal Ultrasound-Guided Prostate Biopsy, Care After This sheet gives you information about how to care for yourself after your procedure. Your doctor may also give you more specific instructions. If you have problems or questions, contact your doctor. What can I expect after the procedure? After the procedure, it is common to have:  Pain and discomfort in your butt, especially while sitting.  Pink-colored pee (urine), due to small amounts of blood in the pee.  Burning while peeing (urinating).  Blood in your poop (stool).  Bleeding from your butt.  Blood in your semen. Follow these instructions at home: Medicines  Take over-the-counter and prescription medicines only as told by your doctor.  If you were prescribed antibiotic medicine, take it as told by your doctor. Do not stop taking the antibiotic even if you start to feel better. Activity   Do not drive for 24 hours if you were given a medicine to help you relax (sedative) during your procedure.  Return to your normal activities as told by your doctor. Ask your doctor what activities are safe for you.  Ask your doctor when it is okay for you to have sex.  Do not lift anything that is heavier than 10 lb (4.5 kg), or the limit that you are told, until your doctor says that it is safe. General instructions   Drink enough water to keep your pee pale yellow.  Watch your pee, poop, and semen for new bleeding or bleeding that gets worse.  Keep all follow-up visits as told by your doctor. This is important. Contact a doctor if you:  Have blood clots in your pee or poop.  Notice that your pee smells bad or unusual.  Have very bad belly pain.  Have trouble peeing.  Notice that your lower belly feels firm.  Have blood in your pee for more than 2 weeks after the procedure.  Have blood in your semen for more than 2 months after the procedure.  Have problems getting an erection.  Feel sick to your stomach  (nauseous).  Throw up (vomit).  Have new or worse bleeding in your pee, poop, or semen. Get help right away if you:  Have a fever or chills.  Have bright red pee.  Have very bad pain that does not get better with medicine.  Cannot pee. Summary  After this procedure, it is common to have pain and discomfort around your butt, especially while sitting.  You may have blood in your pee and poop.  It is common to have blood in your semen for 1-2 months.  If you were prescribed antibiotic medicine, take it as told by your doctor. Do not stop taking the antibiotic even if you start to feel better.  Get help right away if you have a fever or chills. This information is not intended to replace advice given to you by your health care provider. Make sure you discuss any questions you have with your health care provider. Document Revised: 08/14/2018 Document Reviewed: 02/20/2017 Elsevier Patient Education  2020 [employer]   AMBULATORY SURGERY  DISCHARGE INSTRUCTIONS   1) The drugs that you were given will stay in your system until tomorrow so for the next 24 hours you should not:  A) Drive an automobile B) Make any legal decisions C) Drink any alcoholic beverage   2) You may resume regular meals tomorrow.  Today it is better to start with liquids and gradually work up to solid foods.  You may eat anything you prefer,   but it is better to start with liquids, then soup and crackers, and gradually work up to solid foods.   3) Please notify your doctor immediately if you have any unusual bleeding, trouble breathing, redness and pain at the surgery site, drainage, fever, or pain not relieved by medication.    4) Additional Instructions:        Please contact your physician with any problems or Same Day Surgery at [phone], Monday through Friday 6 am to 4 pm, or [facility] [place] [place] number at [phone]. 

## 2020-04-08 NOTE — Anesthesia Preprocedure Evaluation (Addendum)
Anesthesia Evaluation  Patient identified by MRN, date of birth, ID band Patient awake    Reviewed: Allergy & Precautions, H&P , NPO status , Patient's Chart, lab work & pertinent test results  History of Anesthesia Complications Negative for: history of anesthetic complications  Airway Mallampati: III  TM Distance: >3 FB Neck ROM: full    Dental  (+) Chipped, Poor Dentition, Missing   Pulmonary neg pulmonary ROS, neg shortness of breath,           Cardiovascular Exercise Tolerance: Good hypertension, (-) angina(-) Past MI and (-) DOE      Neuro/Psych negative neurological ROS  negative psych ROS   GI/Hepatic negative GI ROS, Neg liver ROS,   Endo/Other  diabetes, Type 2, Insulin Dependent  Renal/GU Renal InsufficiencyRenal disease     Musculoskeletal   Abdominal   Peds  Hematology negative hematology ROS (+)   Anesthesia Other Findings Hoarse voice this morning, no SOB, no fever, otherwise health  Past Medical History: No date: BPH (benign prostatic hyperplasia) No date: Diabetes mellitus without complication (HCC) No date: HOH (hard of hearing)     Comment:  AIDS No date: Hypertension No date: Pain     Comment:  CHRONIC LBP  Past Surgical History: No date: BACK SURGERY 07/10/2017: CATARACT EXTRACTION W/PHACO; Right     Comment:  Procedure: CATARACT EXTRACTION PHACO AND INTRAOCULAR               LENS PLACEMENT (IOC);  Surgeon: Birder Robson, MD;                Location: [facility] ORS;  Service: Ophthalmology;  Laterality:              Right;  [place] 00:29.2 AP% 16.5 CDE 4.83 Fluid Pack Lot #               [id number] No date: COLONOSCOPY No date: EYE SURGERY No date: HERNIA REPAIR     Reproductive/Obstetrics negative OB ROS                             Anesthesia Physical  Anesthesia Plan  ASA: III  Anesthesia Plan: General   Post-op Pain Management:    Induction:  Intravenous  PONV Risk Score and Plan:   Airway Management Planned: Oral ETT  Additional Equipment:   Intra-op Plan:   Post-operative Plan: Extubation in OR  Informed Consent: I have reviewed the patients History and Physical, chart, labs and discussed the procedure including the risks, benefits and alternatives for the proposed anesthesia with the patient or authorized representative who has indicated his/her understanding and acceptance.     Dental Advisory Given  Plan Discussed with: Anesthesiologist, CRNA and Surgeon  Anesthesia Plan Comments: (Patient reports no bleeding problems and no anticoagulant use.  Plan for spinal with backup GA  Patient consented for risks of anesthesia including but not limited to:  - adverse reactions to medications  - damage to teeth, lips or other oral mucosa - sore throat or hoarseness - Damage to heart, brain, lungs or loss of life  Patient voiced understanding.)       Anesthesia Quick Evaluation

## 2020-04-08 NOTE — Transfer of Care (Signed)
Immediate Anesthesia Transfer of Care Note  Patient: Keith Howe  Procedure(s) Performed: PROSTATE BIOPSY URONAV (N/A )  Patient Location: PACU  Anesthesia Type:General  Level of Consciousness: awake, alert , oriented and drowsy  Airway & Oxygen Therapy: Patient Spontanous Breathing and Patient connected to nasal cannula oxygen  Post-op Assessment: Report given to RN and Post -op Vital signs reviewed and stable  Post vital signs: Reviewed and stable  Last Vitals:  Vitals Value Taken Time  BP 97/64 04/08/20 1531  Temp 36.1 C 04/08/20 1531  Pulse 92 04/08/20 1537  Resp 17 04/08/20 1537  SpO2 99 % 04/08/20 1537  Vitals shown include unvalidated device data.  Last Pain:  Vitals:   04/08/20 1531  TempSrc:   PainSc: Asleep         Complications: No complications documented.

## 2020-04-08 NOTE — Anesthesia Procedure Notes (Signed)
Procedure Name: LMA Insertion Date/Time: 04/08/2020 2:43 PM Performed by: Adalberto Ill, CRNA Pre-anesthesia Checklist: Patient identified, Emergency Drugs available, Suction available, Patient being monitored and Timeout performed Patient Re-evaluated:Patient Re-evaluated prior to induction Oxygen Delivery Method: Circle system utilized Preoxygenation: Pre-oxygenation with 100% oxygen Induction Type: IV induction Ventilation: Mask ventilation without difficulty LMA: LMA inserted LMA Size: 4.0 Number of attempts: 1 (brief atraumatic ) Placement Confirmation: positive ETCO2 and breath sounds checked- equal and bilateral ETT to lip (cm): yes. Tube secured with: Tape Dental Injury: Teeth and Oropharynx as per pre-operative assessment

## 2020-04-08 NOTE — Anesthesia Postprocedure Evaluation (Signed)
Anesthesia Post Note  Patient: Keith Howe  Procedure(s) Performed: PROSTATE BIOPSY URONAV (N/A )  Patient location during evaluation: PACU Anesthesia Type: General Level of consciousness: awake and alert Pain management: pain level controlled Vital Signs Assessment: post-procedure vital signs reviewed and stable Respiratory status: spontaneous breathing, nonlabored ventilation, respiratory function stable and patient connected to nasal cannula oxygen Cardiovascular status: blood pressure returned to baseline and stable Postop Assessment: no apparent nausea or vomiting Anesthetic complications: no   No complications documented.   Last Vitals:  Vitals:   04/08/20 1601 04/08/20 1612  BP: 122/74 121/72  Pulse: 95 87  Resp: 18 16  Temp: (!) 36.2 C   SpO2: 94% 100%    Last Pain:  Vitals:   04/08/20 1612  TempSrc:   PainSc: 0-No pain                 Arita Miss

## 2020-04-09 ENCOUNTER — Encounter: Payer: Self-pay | Admitting: Urology

## 2020-04-12 LAB — GLUCOSE, CAPILLARY: Glucose-Capillary: 93 mg/dL (ref 70–99)

## 2020-04-13 LAB — SURGICAL PATHOLOGY

## 2020-04-22 ENCOUNTER — Other Ambulatory Visit (HOSPITAL_COMMUNITY): Payer: Self-pay | Admitting: Urology

## 2020-04-22 ENCOUNTER — Other Ambulatory Visit: Payer: Self-pay | Admitting: Urology

## 2020-04-22 DIAGNOSIS — C61 Malignant neoplasm of prostate: Secondary | ICD-10-CM

## 2020-05-04 ENCOUNTER — Other Ambulatory Visit: Payer: Self-pay | Admitting: Internal Medicine

## 2020-05-13 ENCOUNTER — Encounter
Admission: RE | Admit: 2020-05-13 | Discharge: 2020-05-13 | Disposition: A | Discharge status: Home / Self Care | Payer: Medicare HMO | Source: Ambulatory Visit | Attending: Urology | Admitting: Urology

## 2020-05-13 ENCOUNTER — Other Ambulatory Visit: Payer: Self-pay

## 2020-05-13 ENCOUNTER — Ambulatory Visit
Admission: RE | Admit: 2020-05-13 | Discharge: 2020-05-13 | Disposition: A | Discharge status: Home / Self Care | Payer: Medicare HMO | Source: Ambulatory Visit | Attending: Urology | Admitting: Urology

## 2020-05-13 DIAGNOSIS — C61 Malignant neoplasm of prostate: Secondary | ICD-10-CM

## 2020-05-13 HISTORY — DX: Malignant (primary) neoplasm, unspecified: C80.1

## 2020-05-13 LAB — POCT I-STAT CREATININE: Creatinine, Ser: 1.7 mg/dL — ABNORMAL HIGH (ref 0.61–1.24)

## 2020-05-13 MED ORDER — TECHNETIUM TC 99M MEDRONATE IV KIT
20.0000 | PACK | Freq: Once | INTRAVENOUS | Status: AC | PRN
  Administered 2020-05-13: 21.277 via INTRAVENOUS

## 2020-05-13 MED ORDER — IOHEXOL 300 MG/ML  SOLN
125.0000 mL | Freq: Once | INTRAMUSCULAR | Status: AC | PRN
  Administered 2020-05-13: 100 mL via INTRAVENOUS

## 2020-05-24 ENCOUNTER — Ambulatory Visit: Payer: Medicare HMO | Admitting: Internal Medicine

## 2020-05-27 ENCOUNTER — Encounter: Payer: Self-pay | Admitting: Family Medicine

## 2020-05-27 ENCOUNTER — Ambulatory Visit (INDEPENDENT_AMBULATORY_CARE_PROVIDER_SITE_OTHER): Payer: Medicare HMO | Admitting: Family Medicine

## 2020-05-27 ENCOUNTER — Other Ambulatory Visit: Payer: Self-pay

## 2020-05-27 VITALS — BP 152/82 | HR 88 | Ht 74.0 in | Wt 250.5 lb

## 2020-05-27 DIAGNOSIS — E119 Type 2 diabetes mellitus without complications: Secondary | ICD-10-CM

## 2020-05-27 DIAGNOSIS — N1832 Chronic kidney disease, stage 3b: Secondary | ICD-10-CM | POA: Insufficient documentation

## 2020-05-27 DIAGNOSIS — N183 Chronic kidney disease, stage 3 unspecified: Secondary | ICD-10-CM | POA: Insufficient documentation

## 2020-05-27 DIAGNOSIS — I10 Essential (primary) hypertension: Secondary | ICD-10-CM | POA: Diagnosis not present

## 2020-05-27 DIAGNOSIS — Z794 Long term (current) use of insulin: Secondary | ICD-10-CM | POA: Diagnosis not present

## 2020-05-27 DIAGNOSIS — N1831 Chronic kidney disease, stage 3a: Secondary | ICD-10-CM | POA: Insufficient documentation

## 2020-05-27 DIAGNOSIS — N179 Acute kidney failure, unspecified: Secondary | ICD-10-CM | POA: Insufficient documentation

## 2020-05-27 LAB — GLUCOSE, POCT (MANUAL RESULT ENTRY): POC Glucose: 113 mg/dl — AB (ref 70–99)

## 2020-05-27 LAB — HEMOGLOBIN A1C: Lab: 6.7

## 2020-05-27 NOTE — Assessment & Plan Note (Signed)
Diabetes mellitus Type II, under fair control.. Discussed general issues about diabetes pathophysiology and management. Counseling at today's visit: discussed the need for weight loss, set a weight loss goal of 15 lbs lbs over the next several months, focused on the need for regular aerobic exercise, focused on the need to adhere to the prescribed ADA diet, discussed the advantages of a diet low in carbohydrates, reminded to check sugars regularly and to bring readings in at the time of the next visit, discussed DASH diet and discussed sick day management. Neurosurgeon distributed. Discussed weight loss of 15 lbs in 6 months. His last A1C was 7.3. He is checking glucose 3-4 x daily. I have discussed at length a Freestyle continuous monitor for ease of checking and tracking with his endocrinologist.

## 2020-05-27 NOTE — Progress Notes (Signed)
[id number]

## 2020-05-27 NOTE — Progress Notes (Signed)
Established Patient Office Visit  SUBJECTIVE:  Subjective  Patient ID: Keith Howe, male    DOB: August 06, 1940  Age: 80 y.o. MRN: [id number]  CC:  Chief Complaint  Patient presents with  . Diabetes    Patient checked blood sugar from home and it was 113    HPI Keith Howe is a 80 y.o. male presenting today for     Past Medical History:  Diagnosis Date  . BPH (benign prostatic hyperplasia)   . Cancer ([place])   . Chronic kidney disease    STAGE 3  . Diabetes mellitus without complication ([place])   . HOH (hard of hearing)    AIDS  . Hypertension   . Inguinal hernia   . Pain    CHRONIC LBP    Past Surgical History:  Procedure Laterality Date  . BACK SURGERY     Lumbar  . CATARACT EXTRACTION W/PHACO Right 07/10/2017   Procedure: CATARACT EXTRACTION PHACO AND INTRAOCULAR LENS PLACEMENT (IOC);  Surgeon: Birder Robson, MD;  Location: [facility] ORS;  Service: Ophthalmology;  Laterality: Right;  [place] 00:29.2 AP% 16.5 CDE 4.83 Fluid Pack Lot # [id number]  . COLONOSCOPY    . EYE SURGERY Bilateral    cataract extraction  . HERNIA REPAIR     INGUINAL  . PROSTATE BIOPSY N/A 04/08/2020   Procedure: PROSTATE BIOPSY [name];  Surgeon: Royston Cowper, MD;  Location: [facility] ORS;  Service: Urology;  Laterality: N/A;  . TOTAL KNEE ARTHROPLASTY Left 03/19/2018   Procedure: TOTAL KNEE ARTHROPLASTY;  Surgeon: Thornton Park, MD;  Location: [facility] ORS;  Service: Orthopedics;  Laterality: Left;    Family History  Problem Relation Age of Onset  . Diabetes Father   . Breast cancer Sister     Social History   Socioeconomic History  . Marital status: Married    Spouse name: Not on file  . Number of children: Not on file  . Years of education: Not on file  . Highest education level: Not on file  Occupational History  . Not on file  Tobacco Use  . Smoking status: Never Smoker  . Smokeless tobacco: Never Used  Vaping Use  . Vaping Use: Never used  Substance and Sexual Activity  .  Alcohol use: No  . Drug use: Never  . Sexual activity: Not on file  Other Topics Concern  . Not on file  Social History Narrative   Independent at baseline.  Lives at home with his wife   Social Determinants of Radio broadcast assistant Strain: Not on file  Food Insecurity: Not on file  Transportation Needs: Not on file  Physical Activity: Not on file  Stress: Not on file  Social Connections: Not on file  Intimate Partner Violence: Not on file     Current Outpatient Medications:  .  amLODipine (NORVASC) 5 MG tablet, Take 5 mg by mouth daily. , Disp: , Rfl:  .  aspirin EC 81 MG tablet, Take 81 mg by mouth daily., Disp: , Rfl:  .  cholecalciferol (VITAMIN D) 1000 units tablet, Take 1,000 Units by mouth daily., Disp: , Rfl:  .  dutasteride (AVODART) 0.5 MG capsule, Take 0.5 mg by mouth daily., Disp: , Rfl: 3 .  levofloxacin (LEVAQUIN) 500 MG tablet, Take 1 tablet (500 mg total) by mouth daily., Disp: 7 tablet, Rfl: 0 .  losartan (COZAAR) 100 MG tablet, Take 100 mg by mouth daily., Disp: , Rfl:  .  lovastatin (MEVACOR) 40 MG tablet, TAKE  1 TABLET BY MOUTH EVERY DAY, Disp: 90 tablet, Rfl: 3 .  metoprolol tartrate (LOPRESSOR) 50 MG tablet, TAKE 1 TABLET BY MOUTH EVERY 12 HOURS (Patient taking differently: Take 50 mg by mouth 2 (two) times daily.), Disp: 180 tablet, Rfl: 2 .  Misc Natural Products (PROSTATE SUPPORT PO), Take 2 capsules by mouth daily., Disp: , Rfl:  .  NOVOLOG FLEXPEN 100 UNIT/ML FlexPen, Inject 7 Units into the skin 3 (three) times daily before meals., Disp: , Rfl: 0 .  Omega-3 Fatty Acids (FISH OIL PO), Take 1 capsule by mouth daily. , Disp: , Rfl:  .  tamsulosin (FLOMAX) 0.4 MG CAPS capsule, Take 0.4 mg by mouth daily after breakfast. , Disp: , Rfl:  .  TRESIBA FLEXTOUCH 200 UNIT/ML SOPN, Inject 80 Units into the muscle daily before breakfast. , Disp: , Rfl: 2 .  TRULICITY 1.5 QI/2.9NL SOPN, Inject 1.5 mg into the muscle every Wednesday. , Disp: , Rfl: 1 .  vitamin  B-12 (CYANOCOBALAMIN) 500 MCG tablet, Take 500 mcg by mouth daily., Disp: , Rfl:    No Known Allergies  ROS Review of Systems  Constitutional: Negative.   HENT: Negative.   Respiratory: Negative.   Cardiovascular: Negative.   Genitourinary: Negative.   Neurological: Negative.   All other systems reviewed and are negative.    OBJECTIVE:    Physical Exam Vitals and nursing note reviewed.  Constitutional:      Appearance: He is obese.  HENT:     Head: Normocephalic.     Left Ear: Tympanic membrane normal.     Mouth/Throat:     Mouth: Mucous membranes are moist.  Eyes:     Pupils: Pupils are equal, round, and reactive to light.  Cardiovascular:     Rate and Rhythm: Normal rate and regular rhythm.  Pulmonary:     Effort: Pulmonary effort is normal.  Abdominal:     General: Abdomen is flat.  Musculoskeletal:        General: Normal range of motion.  Skin:    General: Skin is warm.     Capillary Refill: Capillary refill takes less than 2 seconds.  Neurological:     General: No focal deficit present.     Mental Status: He is alert.  Psychiatric:        Mood and Affect: Mood normal.     BP (!) 152/82   Pulse 88   Ht 6\' 2"  (1.88 m)   Wt 250 lb 8 oz (113.6 kg)   BMI 32.16 kg/m  Wt Readings from Last 3 Encounters:  05/27/20 250 lb 8 oz (113.6 kg)  04/08/20 240 lb (108.9 kg)  03/29/20 240 lb (108.9 kg)    Health Maintenance Due  Topic Date Due  . Hepatitis C Screening  Never done  . FOOT EXAM  Never done  . OPHTHALMOLOGY EXAM  Never done  . COVID-19 Vaccine (1) Never done  . TETANUS/TDAP  Never done  . PNA vac Low Risk Adult (2 of 2 - PCV13) 03/23/2019    There are no preventive care reminders to display for this patient.  CBC Latest Ref Rng & Units 03/30/2020 06/04/2018 03/24/2018  WBC 4.0 - 10.5 K/uL 4.9 5.2 8.6  Hemoglobin 13.0 - 17.0 g/dL 13.5 13.7 8.1(L)  Hematocrit 39.0 - 52.0 % 40.6 42.9 23.7(L)  Platelets 150 - 400 K/uL 172 209 178   CMP Latest  Ref Rng & Units 05/13/2020 03/30/2020 06/04/2018  Glucose 70 - 99 mg/dL - 135(H) 162(H)  BUN 8 - 23 mg/dL - 26(H) 23  Creatinine 0.61 - 1.24 mg/dL 1.70(H) 1.74(H) 1.66(H)  Sodium 135 - 145 mmol/L - 141 144  Potassium 3.5 - 5.1 mmol/L - 3.9 4.4  Chloride 98 - 111 mmol/L - 104 106  CO2 22 - 32 mmol/L - 27 29  Calcium 8.9 - 10.3 mg/dL - 9.6 9.5  Total Protein 6.5 - 8.1 g/dL - - 7.6  Total Bilirubin 0.3 - 1.2 mg/dL - - 0.5  Alkaline Phos 38 - 126 U/L - - 57  AST 15 - 41 U/L - - 26  ALT 0 - 44 U/L - - 24    No results found for: TSH Lab Results  Component Value Date   ALBUMIN 4.3 06/04/2018   ANIONGAP 10 03/30/2020   No results found for: CHOL, HDL, LDLCALC, CHOLHDL No results found for: TRIG Lab Results  Component Value Date   HGBA1C 7.3 (H) 03/06/2018      ASSESSMENT & PLAN:   Problem List Items Addressed This Visit      Cardiovascular and Mediastinum   Essential hypertension    HTN not quite at goal today, will watch sodium intake, no edema, sob or CP.         Endocrine   Type 2 diabetes mellitus without complication, with long-term current use of insulin (HCC) - Primary    Diabetes mellitus Type II, under fair control.. Discussed general issues about diabetes pathophysiology and management. Counseling at today's visit: discussed the need for weight loss, set a weight loss goal of 15 lbs lbs over the next several months, focused on the need for regular aerobic exercise, focused on the need to adhere to the prescribed ADA diet, discussed the advantages of a diet low in carbohydrates, reminded to check sugars regularly and to bring readings in at the time of the next visit, discussed DASH diet and discussed sick day management. Neurosurgeon distributed. Discussed weight loss of 15 lbs in 6 months. His last A1C was 7.3. He is checking glucose 3-4 x daily. I have discussed at length a Freestyle continuous monitor for ease of checking and tracking with his endocrinologist.        Relevant Orders   POCT glucose (manual entry) (Completed)   Hemoglobin A1c (Completed)     Genitourinary   Stage 3b chronic kidney disease ([place])    Evaluated by Dr Holley Raring one month ago for f/u, GFR 40. He is maintaining DM II well with insulin and checking his glucose 3-4 x daily.          No orders of the defined types were placed in this encounter.     Follow-up: No follow-ups on file.    Beckie Salts, [place] [address] [place], [place] [address]

## 2020-05-27 NOTE — Assessment & Plan Note (Signed)
HTN not quite at goal today, will watch sodium intake, no edema, sob or CP.

## 2020-05-27 NOTE — Assessment & Plan Note (Signed)
Evaluated by Dr Holley Raring one month ago for f/u, GFR 40. He is maintaining DM II well with insulin and checking his glucose 3-4 x daily.

## 2020-05-31 ENCOUNTER — Other Ambulatory Visit: Payer: Self-pay | Admitting: Internal Medicine

## 2020-05-31 DIAGNOSIS — E118 Type 2 diabetes mellitus with unspecified complications: Secondary | ICD-10-CM

## 2020-06-04 ENCOUNTER — Other Ambulatory Visit: Payer: Self-pay

## 2020-06-04 ENCOUNTER — Encounter: Payer: Self-pay | Admitting: Radiation Oncology

## 2020-06-04 ENCOUNTER — Ambulatory Visit
Admission: RE | Admit: 2020-06-04 | Discharge: 2020-06-04 | Disposition: A | Discharge status: Home / Self Care | Payer: Medicare HMO | Source: Ambulatory Visit | Attending: Radiation Oncology | Admitting: Radiation Oncology

## 2020-06-04 VITALS — BP 162/92 | HR 92 | Temp 97.2°F | Resp 16 | Wt 253.1 lb

## 2020-06-04 DIAGNOSIS — I129 Hypertensive chronic kidney disease with stage 1 through stage 4 chronic kidney disease, or unspecified chronic kidney disease: Secondary | ICD-10-CM | POA: Insufficient documentation

## 2020-06-04 DIAGNOSIS — E1122 Type 2 diabetes mellitus with diabetic chronic kidney disease: Secondary | ICD-10-CM | POA: Insufficient documentation

## 2020-06-04 DIAGNOSIS — N189 Chronic kidney disease, unspecified: Secondary | ICD-10-CM | POA: Insufficient documentation

## 2020-06-04 DIAGNOSIS — Z794 Long term (current) use of insulin: Secondary | ICD-10-CM | POA: Insufficient documentation

## 2020-06-04 DIAGNOSIS — C61 Malignant neoplasm of prostate: Secondary | ICD-10-CM

## 2020-06-04 DIAGNOSIS — Z7982 Long term (current) use of aspirin: Secondary | ICD-10-CM | POA: Diagnosis not present

## 2020-06-04 DIAGNOSIS — Z79899 Other long term (current) drug therapy: Secondary | ICD-10-CM | POA: Insufficient documentation

## 2020-06-04 HISTORY — DX: Malignant neoplasm of prostate: C61

## 2020-06-04 NOTE — Consult Note (Signed)
NEW PATIENT EVALUATION  Name: Keith Howe  MRN: [id number]  Date:   06/04/2020     DOB: 1941-04-02   This 80 y.o. male patient presents to the clinic for initial evaluation of stage IIb (T1 cN0 M0) high-grade high risk prostate cancer Gleason 10 (5+5) no evidence of distant metastatic disease by bone scan or CT criteria presenting with a PSA of 5.7.  REFERRING PHYSICIAN: Cletis Athens, MD  CHIEF COMPLAINT:  Chief Complaint  Patient presents with  . Prostate Cancer    Initial consult    DIAGNOSIS: The encounter diagnosis was Prostate cancer ([place]).   PREVIOUS INVESTIGATIONS:  MRI scans bone scan CT scans reviewed Pathology report reviewed Clinical notes reviewed  HPI: Patient is a 80 year old male who presented with an elevated PSA of 5.7.  He underwent an MRI scan showing a PI-RADS category 5 lesion left posterior lateral and posterior medial peripheral zone in the base mid gland and apex with involvement of the left central zone at the base.  There is also highly suspicion for extraprostatic spread in early seminal vesicle invasion.  Transrectal ultrasound-guided biopsies showed 12 of 12 cores involved with adenocarcinoma mostly Gleason 9 (4+5) although there was one core of Gleason 10 (5+5).  CT scan of the abdomen pelvis showed a moderately enlarged prostate no evidence abdominal pelvic metastatic disease.  Bone scan was negative.  Patient is fairly asymptomatic specifically denies urgency frequency or nocturia.  He is having no bone pain.  He is now referred to radiation oncology for consideration of treatment.  PLANNED TREATMENT REGIMEN: Image guided IMRT radiation therapy  PAST MEDICAL HISTORY:  has a past medical history of BPH (benign prostatic hyperplasia), Cancer ([place]), Chronic kidney disease, Diabetes mellitus without complication ([place]), HOH (hard of hearing), Hypertension, Inguinal hernia, Pain, and Prostate cancer ([place]).    PAST SURGICAL HISTORY:  Past Surgical  History:  Procedure Laterality Date  . BACK SURGERY     Lumbar  . CATARACT EXTRACTION W/PHACO Right 07/10/2017   Procedure: CATARACT EXTRACTION PHACO AND INTRAOCULAR LENS PLACEMENT (IOC);  Surgeon: Birder Robson, MD;  Location: [facility] ORS;  Service: Ophthalmology;  Laterality: Right;  [place] 00:29.2 AP% 16.5 CDE 4.83 Fluid Pack Lot # [id number]  . COLONOSCOPY    . EYE SURGERY Bilateral    cataract extraction  . HERNIA REPAIR     INGUINAL  . PROSTATE BIOPSY N/A 04/08/2020   Procedure: PROSTATE BIOPSY [name];  Surgeon: Royston Cowper, MD;  Location: [facility] ORS;  Service: Urology;  Laterality: N/A;  . TOTAL KNEE ARTHROPLASTY Left 03/19/2018   Procedure: TOTAL KNEE ARTHROPLASTY;  Surgeon: Thornton Park, MD;  Location: [facility] ORS;  Service: Orthopedics;  Laterality: Left;    FAMILY HISTORY: family history includes Breast cancer in his sister; Diabetes in his father.  SOCIAL HISTORY:  reports that he has never smoked. He has never used smokeless tobacco. He reports that he does not drink alcohol and does not use drugs.  ALLERGIES: Patient has no known allergies.  MEDICATIONS:  Current Outpatient Medications  Medication Sig Dispense Refill  . amLODipine (NORVASC) 5 MG tablet Take 5 mg by mouth daily.     Marland Kitchen aspirin EC 81 MG tablet Take 81 mg by mouth daily.    . cholecalciferol (VITAMIN D) 1000 units tablet Take 1,000 Units by mouth daily.    Marland Kitchen dutasteride (AVODART) 0.5 MG capsule Take 0.5 mg by mouth daily.  3  . losartan (COZAAR) 100 MG tablet Take 100 mg by mouth daily.    Marland Kitchen  lovastatin (MEVACOR) 40 MG tablet TAKE 1 TABLET BY MOUTH EVERY DAY 90 tablet 3  . metoprolol tartrate (LOPRESSOR) 50 MG tablet TAKE 1 TABLET BY MOUTH EVERY 12 HOURS 180 tablet 2  . Misc Natural Products (PROSTATE SUPPORT PO) Take 2 capsules by mouth daily.    Marland Kitchen NOVOLOG FLEXPEN 100 UNIT/ML FlexPen Inject 7 Units into the skin 3 (three) times daily before meals.  0  . Omega-3 Fatty Acids (FISH OIL PO) Take 1 capsule by  mouth daily.     . Relugolix (ORGOVYX) 120 MG TABS Take 1 tablet by mouth daily.    . tamsulosin (FLOMAX) 0.4 MG CAPS capsule Take 0.4 mg by mouth daily after breakfast.     . TRESIBA FLEXTOUCH 200 UNIT/ML SOPN Inject 80 Units into the muscle daily before breakfast.   2  . TRULICITY 1.5 JQ/7.3AL SOPN Inject 1.5 mg into the muscle every Wednesday.   1  . vitamin B-12 (CYANOCOBALAMIN) 500 MCG tablet Take 500 mcg by mouth daily.     No current facility-administered medications for this encounter.    ECOG PERFORMANCE STATUS:  0 - Asymptomatic  REVIEW OF SYSTEMS: Patient denies any weight loss, fatigue, weakness, fever, chills or night sweats. Patient denies any loss of vision, blurred vision. Patient denies any ringing  of the ears or hearing loss. No irregular heartbeat. Patient denies heart murmur or history of fainting. Patient denies any chest pain or pain radiating to her upper extremities. Patient denies any shortness of breath, difficulty breathing at night, cough or hemoptysis. Patient denies any swelling in the lower legs. Patient denies any nausea vomiting, vomiting of blood, or coffee ground material in the vomitus. Patient denies any stomach pain. Patient states has had normal bowel movements no significant constipation or diarrhea. Patient denies any dysuria, hematuria or significant nocturia. Patient denies any problems walking, swelling in the joints or loss of balance. Patient denies any skin changes, loss of hair or loss of weight. Patient denies any excessive worrying or anxiety or significant depression. Patient denies any problems with insomnia. Patient denies excessive thirst, polyuria, polydipsia. Patient denies any swollen glands, patient denies easy bruising or easy bleeding. Patient denies any recent infections, allergies or URI. Patient "s visual fields have not changed significantly in recent time.   PHYSICAL EXAM: BP (!) 162/92 (BP Location: Left Arm, Patient Position:  Sitting)   Pulse 92   Temp (!) 97.2 F (36.2 C) (Tympanic)   Resp 16   Wt 253 lb 1.6 oz (114.8 kg)   BMI 32.50 kg/m  Well-developed well-nourished patient in NAD. HEENT reveals PERLA, EOMI, discs not visualized.  Oral cavity is clear. No oral mucosal lesions are identified. Neck is clear without evidence of cervical or supraclavicular adenopathy. Lungs are clear to A&P. Cardiac examination is essentially unremarkable with regular rate and rhythm without murmur rub or thrill. Abdomen is benign with no organomegaly or masses noted. Motor sensory and DTR levels are equal and symmetric in the upper and lower extremities. Cranial nerves II through XII are grossly intact. Proprioception is intact. No peripheral adenopathy or edema is identified. No motor or sensory levels are noted. Crude visual fields are within normal range.  LABORATORY DATA: Pathology report reviewed    RADIOLOGY RESULTS: Prostate MRI scan CT scan of abdomen pelvis and bone scan all reviewed compatible with above-stated findings   IMPRESSION: High risk adenocarcinoma the prostate Gleason score of 9 (61+63) in 80 year old male  PLAN: This time of run the [employer]  nomogram showing an only 30% chance of organ confined disease and greater than 20% chance of lymph node involvement.  Based on that information I would recommend IMRT image guided radiation therapy to his prostate and pelvic nodes.  We will treat his prostate 80 Gray over 8 weeks treating his pelvic nodes to 51 Gray over the same time interval using IMRT dose painting technique.  I have asked Dr. Eliberto Ivory to place fiducial markers in his prostate for daily image guided treatment.  I also like patient to start suppression with Eligard or Lupron and continue that for least 2 to 3 years.  Risks and benefits of treatment including increased lower urinary tract symptoms diarrhea fatigue alteration of blood count skin reaction all were reviewed with the patient.  He and  his wife comprehend my treatment plan well.  Once markers are placed we will set him up for CT simulation.  I would like to take this opportunity to thank you for allowing me to participate in the care of your patient.Noreene Filbert, MD

## 2020-06-17 ENCOUNTER — Ambulatory Visit
Admission: RE | Admit: 2020-06-17 | Discharge: 2020-06-17 | Disposition: A | Discharge status: Home / Self Care | Payer: Medicare HMO | Source: Ambulatory Visit | Attending: Radiation Oncology | Admitting: Radiation Oncology

## 2020-06-17 DIAGNOSIS — Z510 Encounter for antineoplastic radiation therapy: Secondary | ICD-10-CM | POA: Insufficient documentation

## 2020-06-17 DIAGNOSIS — C61 Malignant neoplasm of prostate: Secondary | ICD-10-CM | POA: Diagnosis present

## 2020-06-23 DIAGNOSIS — Z510 Encounter for antineoplastic radiation therapy: Secondary | ICD-10-CM | POA: Diagnosis not present

## 2020-06-25 ENCOUNTER — Other Ambulatory Visit: Payer: Self-pay | Admitting: *Deleted

## 2020-06-25 DIAGNOSIS — C61 Malignant neoplasm of prostate: Secondary | ICD-10-CM

## 2020-06-28 ENCOUNTER — Ambulatory Visit: Admission: RE | Admit: 2020-06-28 | Payer: Medicare HMO | Source: Ambulatory Visit

## 2020-06-29 ENCOUNTER — Ambulatory Visit
Admission: RE | Admit: 2020-06-29 | Discharge: 2020-06-29 | Disposition: A | Discharge status: Home / Self Care | Payer: Medicare HMO | Source: Ambulatory Visit | Attending: Radiation Oncology | Admitting: Radiation Oncology

## 2020-06-29 DIAGNOSIS — Z510 Encounter for antineoplastic radiation therapy: Secondary | ICD-10-CM | POA: Diagnosis not present

## 2020-06-30 ENCOUNTER — Ambulatory Visit
Admission: RE | Admit: 2020-06-30 | Discharge: 2020-06-30 | Disposition: A | Discharge status: Home / Self Care | Payer: Medicare HMO | Source: Ambulatory Visit | Attending: Radiation Oncology | Admitting: Radiation Oncology

## 2020-06-30 DIAGNOSIS — Z510 Encounter for antineoplastic radiation therapy: Secondary | ICD-10-CM | POA: Diagnosis not present

## 2020-07-01 ENCOUNTER — Ambulatory Visit
Admission: RE | Admit: 2020-07-01 | Discharge: 2020-07-01 | Disposition: A | Discharge status: Home / Self Care | Payer: Medicare HMO | Source: Ambulatory Visit | Attending: Radiation Oncology | Admitting: Radiation Oncology

## 2020-07-01 DIAGNOSIS — Z510 Encounter for antineoplastic radiation therapy: Secondary | ICD-10-CM | POA: Diagnosis not present

## 2020-07-02 ENCOUNTER — Ambulatory Visit
Admission: RE | Admit: 2020-07-02 | Discharge: 2020-07-02 | Disposition: A | Discharge status: Home / Self Care | Payer: Medicare HMO | Source: Ambulatory Visit | Attending: Radiation Oncology | Admitting: Radiation Oncology

## 2020-07-02 DIAGNOSIS — Z510 Encounter for antineoplastic radiation therapy: Secondary | ICD-10-CM | POA: Diagnosis not present

## 2020-07-05 ENCOUNTER — Ambulatory Visit
Admission: RE | Admit: 2020-07-05 | Discharge: 2020-07-05 | Disposition: A | Discharge status: Home / Self Care | Payer: Medicare HMO | Source: Ambulatory Visit | Attending: Radiation Oncology | Admitting: Radiation Oncology

## 2020-07-05 DIAGNOSIS — Z510 Encounter for antineoplastic radiation therapy: Secondary | ICD-10-CM | POA: Diagnosis not present

## 2020-07-06 ENCOUNTER — Ambulatory Visit
Admission: RE | Admit: 2020-07-06 | Discharge: 2020-07-06 | Disposition: A | Discharge status: Home / Self Care | Payer: Medicare HMO | Source: Ambulatory Visit | Attending: Radiation Oncology | Admitting: Radiation Oncology

## 2020-07-06 DIAGNOSIS — C61 Malignant neoplasm of prostate: Secondary | ICD-10-CM | POA: Insufficient documentation

## 2020-07-06 DIAGNOSIS — Z510 Encounter for antineoplastic radiation therapy: Secondary | ICD-10-CM | POA: Insufficient documentation

## 2020-07-07 ENCOUNTER — Ambulatory Visit
Admission: RE | Admit: 2020-07-07 | Discharge: 2020-07-07 | Disposition: A | Discharge status: Home / Self Care | Payer: Medicare HMO | Source: Ambulatory Visit | Attending: Radiation Oncology | Admitting: Radiation Oncology

## 2020-07-07 DIAGNOSIS — Z510 Encounter for antineoplastic radiation therapy: Secondary | ICD-10-CM | POA: Diagnosis not present

## 2020-07-08 ENCOUNTER — Ambulatory Visit
Admission: RE | Admit: 2020-07-08 | Discharge: 2020-07-08 | Disposition: A | Discharge status: Home / Self Care | Payer: Medicare HMO | Source: Ambulatory Visit | Attending: Radiation Oncology | Admitting: Radiation Oncology

## 2020-07-08 DIAGNOSIS — Z510 Encounter for antineoplastic radiation therapy: Secondary | ICD-10-CM | POA: Diagnosis not present

## 2020-07-09 ENCOUNTER — Ambulatory Visit
Admission: RE | Admit: 2020-07-09 | Discharge: 2020-07-09 | Disposition: A | Discharge status: Home / Self Care | Payer: Medicare HMO | Source: Ambulatory Visit | Attending: Radiation Oncology | Admitting: Radiation Oncology

## 2020-07-09 DIAGNOSIS — Z510 Encounter for antineoplastic radiation therapy: Secondary | ICD-10-CM | POA: Diagnosis not present

## 2020-07-12 ENCOUNTER — Ambulatory Visit
Admission: RE | Admit: 2020-07-12 | Discharge: 2020-07-12 | Disposition: A | Discharge status: Home / Self Care | Payer: Medicare HMO | Source: Ambulatory Visit | Attending: Radiation Oncology | Admitting: Radiation Oncology

## 2020-07-12 DIAGNOSIS — Z510 Encounter for antineoplastic radiation therapy: Secondary | ICD-10-CM | POA: Diagnosis not present

## 2020-07-13 ENCOUNTER — Ambulatory Visit
Admission: RE | Admit: 2020-07-13 | Discharge: 2020-07-13 | Disposition: A | Discharge status: Home / Self Care | Payer: Medicare HMO | Source: Ambulatory Visit | Attending: Radiation Oncology | Admitting: Radiation Oncology

## 2020-07-13 DIAGNOSIS — Z510 Encounter for antineoplastic radiation therapy: Secondary | ICD-10-CM | POA: Diagnosis not present

## 2020-07-14 ENCOUNTER — Inpatient Hospital Stay: Payer: Medicare HMO | Attending: Radiation Oncology

## 2020-07-14 ENCOUNTER — Ambulatory Visit
Admission: RE | Admit: 2020-07-14 | Discharge: 2020-07-14 | Disposition: A | Discharge status: Home / Self Care | Payer: Medicare HMO | Source: Ambulatory Visit | Attending: Radiation Oncology | Admitting: Radiation Oncology

## 2020-07-14 ENCOUNTER — Other Ambulatory Visit: Payer: Self-pay

## 2020-07-14 DIAGNOSIS — Z510 Encounter for antineoplastic radiation therapy: Secondary | ICD-10-CM | POA: Diagnosis not present

## 2020-07-14 DIAGNOSIS — C61 Malignant neoplasm of prostate: Secondary | ICD-10-CM

## 2020-07-14 LAB — CBC
HCT: 35.7 % — ABNORMAL LOW (ref 39.0–52.0)
Hemoglobin: 11.8 g/dL — ABNORMAL LOW (ref 13.0–17.0)
MCH: 28.1 pg (ref 26.0–34.0)
MCHC: 33.1 g/dL (ref 30.0–36.0)
MCV: 85 fL (ref 80.0–100.0)
Platelets: 135 10*3/uL — ABNORMAL LOW (ref 150–400)
RBC: 4.2 MIL/uL — ABNORMAL LOW (ref 4.22–5.81)
RDW: 13.1 % (ref 11.5–15.5)
WBC: 4 10*3/uL (ref 4.0–10.5)
nRBC: 0 % (ref 0.0–0.2)

## 2020-07-15 ENCOUNTER — Ambulatory Visit
Admission: RE | Admit: 2020-07-15 | Discharge: 2020-07-15 | Disposition: A | Discharge status: Home / Self Care | Payer: Medicare HMO | Source: Ambulatory Visit | Attending: Radiation Oncology | Admitting: Radiation Oncology

## 2020-07-15 DIAGNOSIS — Z510 Encounter for antineoplastic radiation therapy: Secondary | ICD-10-CM | POA: Diagnosis not present

## 2020-07-16 ENCOUNTER — Ambulatory Visit
Admission: RE | Admit: 2020-07-16 | Discharge: 2020-07-16 | Disposition: A | Discharge status: Home / Self Care | Payer: Medicare HMO | Source: Ambulatory Visit | Attending: Radiation Oncology | Admitting: Radiation Oncology

## 2020-07-16 DIAGNOSIS — Z510 Encounter for antineoplastic radiation therapy: Secondary | ICD-10-CM | POA: Diagnosis not present

## 2020-07-19 ENCOUNTER — Ambulatory Visit
Admission: RE | Admit: 2020-07-19 | Discharge: 2020-07-19 | Disposition: A | Discharge status: Home / Self Care | Payer: Medicare HMO | Source: Ambulatory Visit | Attending: Radiation Oncology | Admitting: Radiation Oncology

## 2020-07-19 DIAGNOSIS — Z510 Encounter for antineoplastic radiation therapy: Secondary | ICD-10-CM | POA: Diagnosis not present

## 2020-07-20 ENCOUNTER — Ambulatory Visit
Admission: RE | Admit: 2020-07-20 | Discharge: 2020-07-20 | Disposition: A | Discharge status: Home / Self Care | Payer: Medicare HMO | Source: Ambulatory Visit | Attending: Radiation Oncology | Admitting: Radiation Oncology

## 2020-07-20 DIAGNOSIS — Z510 Encounter for antineoplastic radiation therapy: Secondary | ICD-10-CM | POA: Diagnosis not present

## 2020-07-21 ENCOUNTER — Ambulatory Visit
Admission: RE | Admit: 2020-07-21 | Discharge: 2020-07-21 | Disposition: A | Discharge status: Home / Self Care | Payer: Medicare HMO | Source: Ambulatory Visit | Attending: Radiation Oncology | Admitting: Radiation Oncology

## 2020-07-21 DIAGNOSIS — Z510 Encounter for antineoplastic radiation therapy: Secondary | ICD-10-CM | POA: Diagnosis not present

## 2020-07-22 ENCOUNTER — Ambulatory Visit
Admission: RE | Admit: 2020-07-22 | Discharge: 2020-07-22 | Disposition: A | Discharge status: Home / Self Care | Payer: Medicare HMO | Source: Ambulatory Visit | Attending: Radiation Oncology | Admitting: Radiation Oncology

## 2020-07-22 DIAGNOSIS — Z510 Encounter for antineoplastic radiation therapy: Secondary | ICD-10-CM | POA: Diagnosis not present

## 2020-07-23 ENCOUNTER — Ambulatory Visit
Admission: RE | Admit: 2020-07-23 | Discharge: 2020-07-23 | Disposition: A | Discharge status: Home / Self Care | Payer: Medicare HMO | Source: Ambulatory Visit | Attending: Radiation Oncology | Admitting: Radiation Oncology

## 2020-07-23 DIAGNOSIS — Z510 Encounter for antineoplastic radiation therapy: Secondary | ICD-10-CM | POA: Diagnosis not present

## 2020-07-26 ENCOUNTER — Ambulatory Visit
Admission: RE | Admit: 2020-07-26 | Discharge: 2020-07-26 | Disposition: A | Discharge status: Home / Self Care | Payer: Medicare HMO | Source: Ambulatory Visit | Attending: Radiation Oncology | Admitting: Radiation Oncology

## 2020-07-26 DIAGNOSIS — Z510 Encounter for antineoplastic radiation therapy: Secondary | ICD-10-CM | POA: Diagnosis not present

## 2020-07-27 ENCOUNTER — Ambulatory Visit
Admission: RE | Admit: 2020-07-27 | Discharge: 2020-07-27 | Disposition: A | Discharge status: Home / Self Care | Payer: Medicare HMO | Source: Ambulatory Visit | Attending: Radiation Oncology | Admitting: Radiation Oncology

## 2020-07-27 DIAGNOSIS — Z510 Encounter for antineoplastic radiation therapy: Secondary | ICD-10-CM | POA: Diagnosis not present

## 2020-07-28 ENCOUNTER — Ambulatory Visit
Admission: RE | Admit: 2020-07-28 | Discharge: 2020-07-28 | Disposition: A | Discharge status: Home / Self Care | Payer: Medicare HMO | Source: Ambulatory Visit | Attending: Radiation Oncology | Admitting: Radiation Oncology

## 2020-07-28 ENCOUNTER — Other Ambulatory Visit: Payer: Self-pay

## 2020-07-28 ENCOUNTER — Inpatient Hospital Stay: Payer: Medicare HMO

## 2020-07-28 DIAGNOSIS — C61 Malignant neoplasm of prostate: Secondary | ICD-10-CM

## 2020-07-28 DIAGNOSIS — Z510 Encounter for antineoplastic radiation therapy: Secondary | ICD-10-CM | POA: Diagnosis not present

## 2020-07-28 LAB — CBC
HCT: 35.6 % — ABNORMAL LOW (ref 39.0–52.0)
Hemoglobin: 11.9 g/dL — ABNORMAL LOW (ref 13.0–17.0)
MCH: 28.4 pg (ref 26.0–34.0)
MCHC: 33.4 g/dL (ref 30.0–36.0)
MCV: 85 fL (ref 80.0–100.0)
Platelets: 136 10*3/uL — ABNORMAL LOW (ref 150–400)
RBC: 4.19 MIL/uL — ABNORMAL LOW (ref 4.22–5.81)
RDW: 13.4 % (ref 11.5–15.5)
WBC: 3.6 10*3/uL — ABNORMAL LOW (ref 4.0–10.5)
nRBC: 0 % (ref 0.0–0.2)

## 2020-07-29 ENCOUNTER — Ambulatory Visit
Admission: RE | Admit: 2020-07-29 | Discharge: 2020-07-29 | Disposition: A | Discharge status: Home / Self Care | Payer: Medicare HMO | Source: Ambulatory Visit | Attending: Radiation Oncology | Admitting: Radiation Oncology

## 2020-07-29 DIAGNOSIS — Z510 Encounter for antineoplastic radiation therapy: Secondary | ICD-10-CM | POA: Diagnosis not present

## 2020-07-30 ENCOUNTER — Ambulatory Visit
Admission: RE | Admit: 2020-07-30 | Discharge: 2020-07-30 | Disposition: A | Discharge status: Home / Self Care | Payer: Medicare HMO | Source: Ambulatory Visit | Attending: Radiation Oncology | Admitting: Radiation Oncology

## 2020-07-30 DIAGNOSIS — Z510 Encounter for antineoplastic radiation therapy: Secondary | ICD-10-CM | POA: Diagnosis not present

## 2020-08-02 ENCOUNTER — Ambulatory Visit
Admission: RE | Admit: 2020-08-02 | Discharge: 2020-08-02 | Disposition: A | Discharge status: Home / Self Care | Payer: Medicare HMO | Source: Ambulatory Visit | Attending: Radiation Oncology | Admitting: Radiation Oncology

## 2020-08-02 DIAGNOSIS — Z510 Encounter for antineoplastic radiation therapy: Secondary | ICD-10-CM | POA: Diagnosis not present

## 2020-08-03 ENCOUNTER — Ambulatory Visit
Admission: RE | Admit: 2020-08-03 | Discharge: 2020-08-03 | Disposition: A | Discharge status: Home / Self Care | Payer: Medicare HMO | Source: Ambulatory Visit | Attending: Radiation Oncology | Admitting: Radiation Oncology

## 2020-08-03 DIAGNOSIS — Z510 Encounter for antineoplastic radiation therapy: Secondary | ICD-10-CM | POA: Diagnosis not present

## 2020-08-04 ENCOUNTER — Ambulatory Visit
Admission: RE | Admit: 2020-08-04 | Discharge: 2020-08-04 | Disposition: A | Discharge status: Home / Self Care | Payer: Medicare HMO | Source: Ambulatory Visit | Attending: Radiation Oncology | Admitting: Radiation Oncology

## 2020-08-04 DIAGNOSIS — Z510 Encounter for antineoplastic radiation therapy: Secondary | ICD-10-CM | POA: Diagnosis not present

## 2020-08-05 ENCOUNTER — Ambulatory Visit
Admission: RE | Admit: 2020-08-05 | Discharge: 2020-08-05 | Disposition: A | Discharge status: Home / Self Care | Payer: Medicare HMO | Source: Ambulatory Visit | Attending: Radiation Oncology | Admitting: Radiation Oncology

## 2020-08-05 DIAGNOSIS — Z510 Encounter for antineoplastic radiation therapy: Secondary | ICD-10-CM | POA: Diagnosis not present

## 2020-08-06 ENCOUNTER — Ambulatory Visit
Admission: RE | Admit: 2020-08-06 | Discharge: 2020-08-06 | Disposition: A | Discharge status: Home / Self Care | Payer: Medicare HMO | Source: Ambulatory Visit | Attending: Radiation Oncology | Admitting: Radiation Oncology

## 2020-08-06 DIAGNOSIS — Z510 Encounter for antineoplastic radiation therapy: Secondary | ICD-10-CM | POA: Insufficient documentation

## 2020-08-06 DIAGNOSIS — C61 Malignant neoplasm of prostate: Secondary | ICD-10-CM | POA: Diagnosis present

## 2020-08-09 ENCOUNTER — Ambulatory Visit
Admission: RE | Admit: 2020-08-09 | Discharge: 2020-08-09 | Disposition: A | Discharge status: Home / Self Care | Payer: Medicare HMO | Source: Ambulatory Visit | Attending: Radiation Oncology | Admitting: Radiation Oncology

## 2020-08-09 DIAGNOSIS — Z510 Encounter for antineoplastic radiation therapy: Secondary | ICD-10-CM | POA: Diagnosis not present

## 2020-08-10 ENCOUNTER — Ambulatory Visit
Admission: RE | Admit: 2020-08-10 | Discharge: 2020-08-10 | Disposition: A | Discharge status: Home / Self Care | Payer: Medicare HMO | Source: Ambulatory Visit | Attending: Radiation Oncology | Admitting: Radiation Oncology

## 2020-08-10 DIAGNOSIS — Z510 Encounter for antineoplastic radiation therapy: Secondary | ICD-10-CM | POA: Diagnosis not present

## 2020-08-11 ENCOUNTER — Ambulatory Visit
Admission: RE | Admit: 2020-08-11 | Discharge: 2020-08-11 | Disposition: A | Discharge status: Home / Self Care | Payer: Medicare HMO | Source: Ambulatory Visit | Attending: Radiation Oncology | Admitting: Radiation Oncology

## 2020-08-11 ENCOUNTER — Inpatient Hospital Stay: Payer: Medicare HMO | Attending: Radiation Oncology

## 2020-08-11 ENCOUNTER — Other Ambulatory Visit: Payer: Self-pay

## 2020-08-11 DIAGNOSIS — Z510 Encounter for antineoplastic radiation therapy: Secondary | ICD-10-CM | POA: Diagnosis not present

## 2020-08-11 DIAGNOSIS — C61 Malignant neoplasm of prostate: Secondary | ICD-10-CM

## 2020-08-11 LAB — CBC
HCT: 35.2 % — ABNORMAL LOW (ref 39.0–52.0)
Hemoglobin: 11.6 g/dL — ABNORMAL LOW (ref 13.0–17.0)
MCH: 27.8 pg (ref 26.0–34.0)
MCHC: 33 g/dL (ref 30.0–36.0)
MCV: 84.2 fL (ref 80.0–100.0)
Platelets: 137 10*3/uL — ABNORMAL LOW (ref 150–400)
RBC: 4.18 MIL/uL — ABNORMAL LOW (ref 4.22–5.81)
RDW: 13.2 % (ref 11.5–15.5)
WBC: 3.8 10*3/uL — ABNORMAL LOW (ref 4.0–10.5)
nRBC: 0 % (ref 0.0–0.2)

## 2020-08-12 ENCOUNTER — Ambulatory Visit
Admission: RE | Admit: 2020-08-12 | Discharge: 2020-08-12 | Disposition: A | Discharge status: Home / Self Care | Payer: Medicare HMO | Source: Ambulatory Visit | Attending: Radiation Oncology | Admitting: Radiation Oncology

## 2020-08-12 DIAGNOSIS — Z510 Encounter for antineoplastic radiation therapy: Secondary | ICD-10-CM | POA: Diagnosis not present

## 2020-08-13 ENCOUNTER — Ambulatory Visit
Admission: RE | Admit: 2020-08-13 | Discharge: 2020-08-13 | Disposition: A | Discharge status: Home / Self Care | Payer: Medicare HMO | Source: Ambulatory Visit | Attending: Radiation Oncology | Admitting: Radiation Oncology

## 2020-08-13 DIAGNOSIS — Z510 Encounter for antineoplastic radiation therapy: Secondary | ICD-10-CM | POA: Diagnosis not present

## 2020-08-16 ENCOUNTER — Ambulatory Visit
Admission: RE | Admit: 2020-08-16 | Discharge: 2020-08-16 | Disposition: A | Discharge status: Home / Self Care | Payer: Medicare HMO | Source: Ambulatory Visit | Attending: Radiation Oncology | Admitting: Radiation Oncology

## 2020-08-16 DIAGNOSIS — Z510 Encounter for antineoplastic radiation therapy: Secondary | ICD-10-CM | POA: Diagnosis not present

## 2020-08-17 ENCOUNTER — Ambulatory Visit
Admission: RE | Admit: 2020-08-17 | Discharge: 2020-08-17 | Disposition: A | Discharge status: Home / Self Care | Payer: Medicare HMO | Source: Ambulatory Visit | Attending: Radiation Oncology | Admitting: Radiation Oncology

## 2020-08-17 DIAGNOSIS — Z510 Encounter for antineoplastic radiation therapy: Secondary | ICD-10-CM | POA: Diagnosis not present

## 2020-08-18 ENCOUNTER — Ambulatory Visit
Admission: RE | Admit: 2020-08-18 | Discharge: 2020-08-18 | Disposition: A | Discharge status: Home / Self Care | Payer: Medicare HMO | Source: Ambulatory Visit | Attending: Radiation Oncology | Admitting: Radiation Oncology

## 2020-08-18 DIAGNOSIS — Z510 Encounter for antineoplastic radiation therapy: Secondary | ICD-10-CM | POA: Diagnosis not present

## 2020-08-19 ENCOUNTER — Ambulatory Visit
Admission: RE | Admit: 2020-08-19 | Discharge: 2020-08-19 | Disposition: A | Discharge status: Home / Self Care | Payer: Medicare HMO | Source: Ambulatory Visit | Attending: Radiation Oncology | Admitting: Radiation Oncology

## 2020-08-19 DIAGNOSIS — Z510 Encounter for antineoplastic radiation therapy: Secondary | ICD-10-CM | POA: Diagnosis not present

## 2020-08-20 ENCOUNTER — Ambulatory Visit
Admission: RE | Admit: 2020-08-20 | Discharge: 2020-08-20 | Disposition: A | Discharge status: Home / Self Care | Payer: Medicare HMO | Source: Ambulatory Visit | Attending: Radiation Oncology | Admitting: Radiation Oncology

## 2020-08-20 DIAGNOSIS — Z510 Encounter for antineoplastic radiation therapy: Secondary | ICD-10-CM | POA: Diagnosis not present

## 2020-08-23 ENCOUNTER — Ambulatory Visit
Admission: RE | Admit: 2020-08-23 | Discharge: 2020-08-23 | Disposition: A | Discharge status: Home / Self Care | Payer: Medicare HMO | Source: Ambulatory Visit | Attending: Radiation Oncology | Admitting: Radiation Oncology

## 2020-08-23 DIAGNOSIS — Z510 Encounter for antineoplastic radiation therapy: Secondary | ICD-10-CM | POA: Diagnosis not present

## 2020-08-26 ENCOUNTER — Other Ambulatory Visit: Payer: Self-pay

## 2020-08-26 ENCOUNTER — Ambulatory Visit (INDEPENDENT_AMBULATORY_CARE_PROVIDER_SITE_OTHER): Payer: Medicare HMO | Admitting: Internal Medicine

## 2020-08-26 ENCOUNTER — Encounter: Payer: Self-pay | Admitting: Internal Medicine

## 2020-08-26 VITALS — BP 141/74 | HR 82 | Ht 74.0 in | Wt 246.4 lb

## 2020-08-26 DIAGNOSIS — E119 Type 2 diabetes mellitus without complications: Secondary | ICD-10-CM | POA: Diagnosis not present

## 2020-08-26 DIAGNOSIS — S76109A Unspecified injury of unspecified quadriceps muscle, fascia and tendon, initial encounter: Secondary | ICD-10-CM | POA: Insufficient documentation

## 2020-08-26 DIAGNOSIS — I10 Essential (primary) hypertension: Secondary | ICD-10-CM

## 2020-08-26 DIAGNOSIS — E669 Obesity, unspecified: Secondary | ICD-10-CM | POA: Diagnosis not present

## 2020-08-26 DIAGNOSIS — Z794 Long term (current) use of insulin: Secondary | ICD-10-CM | POA: Diagnosis not present

## 2020-08-26 DIAGNOSIS — Z6831 Body mass index (BMI) 31.0-31.9, adult: Secondary | ICD-10-CM

## 2020-08-26 NOTE — Addendum Note (Signed)
Addended by: Alois Cliche on: 08/26/2020 09:40 AM   Modules accepted: Orders

## 2020-08-26 NOTE — Assessment & Plan Note (Signed)
Patient injured the right quadricep muscle there is a dent on the top of the rt patella, patient was referred back to orthopedic surgeon for further evaluation

## 2020-08-26 NOTE — Assessment & Plan Note (Signed)

## 2020-08-26 NOTE — Assessment & Plan Note (Addendum)
-   The patient's blood sugar is not under control on med. - The patient will continue the current treatment regimen.  - I encouraged the patient to regularly check blood sugar.  - I encouraged the patient to monitor diet. I encouraged the patient to eat low-carb and low-sugar to help prevent blood sugar spikes.  - I encouraged the patient to continue following their prescribed treatment plan for diabetes - I informed the patient to get help if blood sugar drops below 54mg /dL, or if suddenly have trouble thinking clearly or breathing.

## 2020-08-26 NOTE — Progress Notes (Signed)
Established Patient Office Visit  Subjective:  Patient ID: Keith Howe, male    DOB: 08/17/1940  Age: 80 y.o. MRN: [id number]  CC:  Chief Complaint  Patient presents with  . Follow-up    HPI  Keith Howe presents for injury of the right knee.  Patient injured his right knee in February he has trouble walking.  He has seen the orthopedic surgeon.  Now he has trouble walking complaining of weakness of the right knee and has a dent on the top of the right knee at the quadriceps region, I referred him back to the orthopedic surgeon for evaluation of the right knee and quadricep tendon.  Past Medical History:  Diagnosis Date  . BPH (benign prostatic hyperplasia)   . Cancer ([place])   . Chronic kidney disease    STAGE 3  . Diabetes mellitus without complication ([place])   . HOH (hard of hearing)    AIDS  . Hypertension   . Inguinal hernia   . Pain    CHRONIC LBP  . Prostate cancer [facility])     Past Surgical History:  Procedure Laterality Date  . BACK SURGERY     Lumbar  . CATARACT EXTRACTION W/PHACO Right 07/10/2017   Procedure: CATARACT EXTRACTION PHACO AND INTRAOCULAR LENS PLACEMENT (IOC);  Surgeon: Birder Robson, MD;  Location: [facility] ORS;  Service: Ophthalmology;  Laterality: Right;  [place] 00:29.2 AP% 16.5 CDE 4.83 Fluid Pack Lot # [id number]  . COLONOSCOPY    . EYE SURGERY Bilateral    cataract extraction  . HERNIA REPAIR     INGUINAL  . PROSTATE BIOPSY N/A 04/08/2020   Procedure: PROSTATE BIOPSY [name];  Surgeon: Royston Cowper, MD;  Location: [facility] ORS;  Service: Urology;  Laterality: N/A;  . TOTAL KNEE ARTHROPLASTY Left 03/19/2018   Procedure: TOTAL KNEE ARTHROPLASTY;  Surgeon: Thornton Park, MD;  Location: [facility] ORS;  Service: Orthopedics;  Laterality: Left;    Family History  Problem Relation Age of Onset  . Diabetes Father   . Breast cancer Sister     Social History   Socioeconomic History  . Marital status: Married    Spouse name: Not on file  .  Number of children: Not on file  . Years of education: Not on file  . Highest education level: Not on file  Occupational History  . Not on file  Tobacco Use  . Smoking status: Never Smoker  . Smokeless tobacco: Never Used  Vaping Use  . Vaping Use: Never used  Substance and Sexual Activity  . Alcohol use: No  . Drug use: Never  . Sexual activity: Not on file  Other Topics Concern  . Not on file  Social History Narrative   Independent at baseline.  Lives at home with his wife   Social Determinants of Radio broadcast assistant Strain: Not on file  Food Insecurity: Not on file  Transportation Needs: Not on file  Physical Activity: Not on file  Stress: Not on file  Social Connections: Not on file  Intimate Partner Violence: Not on file     Current Outpatient Medications:  .  amLODipine (NORVASC) 5 MG tablet, Take 5 mg by mouth daily. , Disp: , Rfl:  .  aspirin EC 81 MG tablet, Take 81 mg by mouth daily., Disp: , Rfl:  .  cholecalciferol (VITAMIN D) 1000 units tablet, Take 1,000 Units by mouth daily., Disp: , Rfl:  .  dutasteride (AVODART) 0.5 MG capsule, Take 0.5 mg by  mouth daily., Disp: , Rfl: 3 .  losartan (COZAAR) 100 MG tablet, Take 100 mg by mouth daily., Disp: , Rfl:  .  lovastatin (MEVACOR) 40 MG tablet, TAKE 1 TABLET BY MOUTH EVERY DAY, Disp: 90 tablet, Rfl: 3 .  metoprolol tartrate (LOPRESSOR) 50 MG tablet, TAKE 1 TABLET BY MOUTH EVERY 12 HOURS, Disp: 180 tablet, Rfl: 2 .  Misc Natural Products (PROSTATE SUPPORT PO), Take 2 capsules by mouth daily., Disp: , Rfl:  .  NOVOLOG FLEXPEN 100 UNIT/ML FlexPen, Inject 7 Units into the skin 3 (three) times daily before meals., Disp: , Rfl: 0 .  Omega-3 Fatty Acids (FISH OIL PO), Take 1 capsule by mouth daily. , Disp: , Rfl:  .  Relugolix (ORGOVYX) 120 MG TABS, Take 1 tablet by mouth daily., Disp: , Rfl:  .  tamsulosin (FLOMAX) 0.4 MG CAPS capsule, Take 0.4 mg by mouth daily after breakfast. , Disp: , Rfl:  .  TRESIBA  FLEXTOUCH 200 UNIT/ML SOPN, Inject 80 Units into the muscle daily before breakfast. , Disp: , Rfl: 2 .  TRULICITY 1.5 TK/1.6WF SOPN, Inject 1.5 mg into the muscle every Wednesday. , Disp: , Rfl: 1 .  vitamin B-12 (CYANOCOBALAMIN) 500 MCG tablet, Take 500 mcg by mouth daily., Disp: , Rfl:    No Known Allergies  ROS Review of Systems  Constitutional: Negative.   HENT: Negative.   Eyes: Negative.   Respiratory: Negative.   Cardiovascular: Negative.   Gastrointestinal: Negative.   Endocrine: Negative.   Genitourinary: Negative.   Musculoskeletal: Positive for gait problem.       Weakness of the right knee.  Skin: Negative.   Allergic/Immunologic: Negative.   Hematological: Negative.   Psychiatric/Behavioral: Negative.   All other systems reviewed and are negative.     Objective:    Physical Exam Vitals reviewed.  Constitutional:      Appearance: Normal appearance.  HENT:     Mouth/Throat:     Mouth: Mucous membranes are moist.  Eyes:     Pupils: Pupils are equal, round, and reactive to light.  Neck:     Vascular: No carotid bruit.  Cardiovascular:     Rate and Rhythm: Normal rate and regular rhythm.     Pulses: Normal pulses.     Heart sounds: Normal heart sounds.  Pulmonary:     Effort: Pulmonary effort is normal.     Breath sounds: Normal breath sounds.  Abdominal:     General: Bowel sounds are normal.     Palpations: Abdomen is soft. There is no hepatomegaly, splenomegaly or mass.     Tenderness: There is no abdominal tenderness.     Hernia: No hernia is present.  Musculoskeletal:     Cervical back: Neck supple.     Right lower leg: Deformity present. No edema.     Left lower leg: No edema.     Comments: Patient has a weakness of the lower rt quadricep muscle on the right side.  He has trouble walking and he is using a stick to walk.  He will be referred back to the orthopedic surgeon again.  Skin:    Findings: No rash.  Neurological:     Mental Status: He is  alert and oriented to person, place, and time.     Motor: No weakness.  Psychiatric:        Mood and Affect: Mood normal.        Behavior: Behavior normal.     BP (!) 141/74  Pulse 82   Ht 6\' 2"  (1.88 m)   Wt 246 lb 6.4 oz (111.8 kg)   BMI 31.64 kg/m  Wt Readings from Last 3 Encounters:  08/26/20 246 lb 6.4 oz (111.8 kg)  06/04/20 253 lb 1.6 oz (114.8 kg)  05/27/20 250 lb 8 oz (113.6 kg)     Health Maintenance Due  Topic Date Due  . Hepatitis C Screening  Never done  . FOOT EXAM  Never done  . OPHTHALMOLOGY EXAM  Never done  . COVID-19 Vaccine (1) Never done  . TETANUS/TDAP  Never done  . PNA vac Low Risk Adult (2 of 2 - PCV13) 03/23/2019    There are no preventive care reminders to display for this patient.  No results found for: TSH Lab Results  Component Value Date   WBC 3.8 (L) 08/11/2020   HGB 11.6 (L) 08/11/2020   HCT 35.2 (L) 08/11/2020   MCV 84.2 08/11/2020   PLT 137 (L) 08/11/2020   Lab Results  Component Value Date   NA 141 03/30/2020   K 3.9 03/30/2020   CO2 27 03/30/2020   GLUCOSE 135 (H) 03/30/2020   BUN 26 (H) 03/30/2020   CREATININE 1.70 (H) 05/13/2020   BILITOT 0.5 06/04/2018   ALKPHOS 57 06/04/2018   AST 26 06/04/2018   ALT 24 06/04/2018   PROT 7.6 06/04/2018   ALBUMIN 4.3 06/04/2018   CALCIUM 9.6 03/30/2020   ANIONGAP 10 03/30/2020   No results found for: CHOL No results found for: HDL No results found for: LDLCALC No results found for: TRIG No results found for: CHOLHDL Lab Results  Component Value Date   HGBA1C 7.3 (H) 03/06/2018      Assessment & Plan:   Problem List Items Addressed This Visit      Cardiovascular and Mediastinum   Essential hypertension - Primary    Patient blood pressure is normal patient denies any chest pain or shortness of breath there is no history of palpitation paroxysmal nocturnal dyspnea patient can walk  25yards without any problem patient was advised to follow low-salt low-cholesterol  diet  I reviewed the results of Sprint trial  ideally I want to keep systolic blood pressure below 130 mmHg, patient was asked to check blood pressure 3 times a week and give me a report on that.  Patient will be follow-up in 3 months, patient will call me back for any change in the cardiovascular symptoms           Endocrine   Type 2 diabetes mellitus without complication, with long-term current use of insulin ([place])    - The patient's blood sugar is not under control on med. - The patient will continue the current treatment regimen.  - I encouraged the patient to regularly check blood sugar.  - I encouraged the patient to monitor diet. I encouraged the patient to eat low-carb and low-sugar to help prevent blood sugar spikes.  - I encouraged the patient to continue following their prescribed treatment plan for diabetes - I informed the patient to get help if blood sugar drops below 54mg /dL, or if suddenly have trouble thinking clearly or breathing.         Musculoskeletal and Integument   Injury of quadriceps muscle    Patient injured the right quadricep muscle there is a dent on the top of the rt patella, patient was referred back to orthopedic surgeon for further evaluation        Other   Class 1  obesity with serious comorbidity and body mass index (BMI) of 31.0 to 31.9 in adult    - I encouraged the patient to lose weight.  - I educated them on making healthy dietary choices including eating more fruits and vegetables and less fried foods. - I encouraged the patient to exercise more, and educated on the benefits of exercise including weight loss, diabetes prevention, and hypertension prevention.   Dietary counseling with a registered dietician  Referral to a weight management support group (e.g. Weight Watchers, Overeaters Anonymous)  If your BMI is greater than 29 or you have gained more than 15 pounds you should work on weight loss.  Attend a healthy cooking class           No orders of the defined types were placed in this encounter.   Follow-up: No follow-ups on file.  Refer to Ortho  Cletis Athens, MD

## 2020-08-26 NOTE — Assessment & Plan Note (Signed)

## 2020-09-09 NOTE — Addendum Note (Signed)
Addended by: Lacretia Nicks L on: 09/09/2020 11:16 AM   Modules accepted: Orders

## 2020-09-23 ENCOUNTER — Other Ambulatory Visit: Payer: Self-pay | Admitting: *Deleted

## 2020-09-23 DIAGNOSIS — C61 Malignant neoplasm of prostate: Secondary | ICD-10-CM

## 2020-09-24 ENCOUNTER — Ambulatory Visit
Admission: RE | Admit: 2020-09-24 | Discharge: 2020-09-24 | Disposition: A | Discharge status: Home / Self Care | Payer: Medicare HMO | Source: Ambulatory Visit | Attending: Radiation Oncology | Admitting: Radiation Oncology

## 2020-09-24 ENCOUNTER — Encounter: Payer: Self-pay | Admitting: Radiation Oncology

## 2020-09-24 DIAGNOSIS — C61 Malignant neoplasm of prostate: Secondary | ICD-10-CM | POA: Diagnosis present

## 2020-09-24 DIAGNOSIS — R351 Nocturia: Secondary | ICD-10-CM | POA: Diagnosis not present

## 2020-09-24 DIAGNOSIS — Z923 Personal history of irradiation: Secondary | ICD-10-CM | POA: Insufficient documentation

## 2020-09-24 NOTE — Progress Notes (Signed)
Radiation Oncology Follow up Note  Name: Keith Howe   Date:   09/24/2020 MRN:  [id number] DOB: [birth year]    This 80 y.o. male presents to the clinic today for 1 month follow-up status post IMRT radiation therapy for Gleason 10 (5+5) adenocarcinoma the prostate presenting with a PSA of 5.7.  REFERRING PROVIDER: Cletis Athens, MD  HPI: Patient is a 80 year old male now out 1 month having completed IMRT to prostate and pelvic nodes for Gleason 10 (5+5) adenocarcinoma the prostate presenting with a PSA of 5.7.  He is seen today in routine follow-up is doing well he is not having diarrhea or any real exacerbation of lower urinary tract symptoms.  He currently is on Avodart does have some slight nocturia.  We referred him back to Dr. Aletha Halim for a Eligard injection although that has never been performed.  COMPLICATIONS OF TREATMENT: none  FOLLOW UP COMPLIANCE: keeps appointments   PHYSICAL EXAM:  BP (P) 137/71 (BP Location: Left Arm, Patient Position: Sitting)   Pulse (P) 75   Temp (!) (P) 96.5 F (35.8 C) (Tympanic)   Resp (P) 16   Wt (P) 246 lb (111.6 kg)   BMI (P) 31.58 kg/m  Well-developed well-nourished patient in NAD. HEENT reveals PERLA, EOMI, discs not visualized.  Oral cavity is clear. No oral mucosal lesions are identified. Neck is clear without evidence of cervical or supraclavicular adenopathy. Lungs are clear to A&P. Cardiac examination is essentially unremarkable with regular rate and rhythm without murmur rub or thrill. Abdomen is benign with no organomegaly or masses noted. Motor sensory and DTR levels are equal and symmetric in the upper and lower extremities. Cranial nerves II through XII are grossly intact. Proprioception is intact. No peripheral adenopathy or edema is identified. No motor or sensory levels are noted. Crude visual fields are within normal range.  Impression: 80 year old male 1 month out from IMRT radiation therapy to prostate and pelvic nodes with  excellent side effect profile  Plan: Present time patient is doing well very low side effect profile from his IMRT radiation therapy and pleased with his overall progress I have explained to him and his wife the tracking of his progress with repeat PSA in 3 months.  At this time we will hold off Eligard although based on his PSA findings in 3 months may initiate that at that time.  I would like to take this opportunity to thank you for allowing me to participate in the care of your patient.Marland Kitchen

## 2020-11-23 ENCOUNTER — Encounter: Payer: Self-pay | Admitting: Internal Medicine

## 2020-11-23 ENCOUNTER — Other Ambulatory Visit: Payer: Self-pay

## 2020-11-23 ENCOUNTER — Ambulatory Visit (INDEPENDENT_AMBULATORY_CARE_PROVIDER_SITE_OTHER): Payer: Medicare HMO | Admitting: Internal Medicine

## 2020-11-23 VITALS — BP 150/80 | HR 85 | Ht 74.0 in | Wt 247.8 lb

## 2020-11-23 DIAGNOSIS — Z6831 Body mass index (BMI) 31.0-31.9, adult: Secondary | ICD-10-CM

## 2020-11-23 DIAGNOSIS — Z0000 Encounter for general adult medical examination without abnormal findings: Secondary | ICD-10-CM

## 2020-11-23 DIAGNOSIS — S76109A Unspecified injury of unspecified quadriceps muscle, fascia and tendon, initial encounter: Secondary | ICD-10-CM

## 2020-11-23 DIAGNOSIS — N1832 Chronic kidney disease, stage 3b: Secondary | ICD-10-CM | POA: Diagnosis not present

## 2020-11-23 DIAGNOSIS — Z794 Long term (current) use of insulin: Secondary | ICD-10-CM

## 2020-11-23 DIAGNOSIS — I10 Essential (primary) hypertension: Secondary | ICD-10-CM

## 2020-11-23 DIAGNOSIS — E119 Type 2 diabetes mellitus without complications: Secondary | ICD-10-CM | POA: Diagnosis not present

## 2020-11-23 DIAGNOSIS — E669 Obesity, unspecified: Secondary | ICD-10-CM

## 2020-11-23 NOTE — Assessment & Plan Note (Signed)

## 2020-11-23 NOTE — Assessment & Plan Note (Signed)

## 2020-11-23 NOTE — Assessment & Plan Note (Signed)

## 2020-11-23 NOTE — Assessment & Plan Note (Signed)
Suggest physical therapy ?

## 2020-11-23 NOTE — Assessment & Plan Note (Signed)
Umbilical hernia.  There is 1+ pedal edema.  He had a quadriceps rupture on the right side.  Heart is regular chest is clear, abdominal soft nontender, patient was advised to lose weight follow low-cholesterol diet walk on a daily basis.  He does not smoke does not drink.

## 2020-11-23 NOTE — Assessment & Plan Note (Signed)
Referred to nephrologist.

## 2020-11-23 NOTE — Progress Notes (Signed)
Established Patient Office Visit  Subjective:  Patient ID: Keith Howe, male    DOB: 04/13/1941  Age: 80 y.o. MRN: [id number]  CC:  Chief Complaint  Patient presents with   Annual Exam    Diabetes He has type 2 diabetes mellitus. The initial diagnosis of diabetes was made 10 years ago. Pertinent negatives for hypoglycemia include no confusion or dizziness. Pertinent negatives for diabetes include no blurred vision, no chest pain, no fatigue, no foot paresthesias, no foot ulcerations, no polydipsia, no polyphagia, no polyuria, no visual change and no weight loss. Pertinent negatives for hypoglycemia complications include no blackouts. Pertinent negatives for diabetic complications include no CVA, heart disease, peripheral neuropathy or PVD. He is following a diabetic and low fat/cholesterol diet. He has not had a previous visit with a dietitian.   Keith Howe presents for physical  Past Medical History:  Diagnosis Date   BPH (benign prostatic hyperplasia)    Cancer ([place])    Chronic kidney disease    STAGE 3   Diabetes mellitus without complication (HCC)    HOH (hard of hearing)    AIDS   Hypertension    Inguinal hernia    Pain    CHRONIC LBP   Prostate cancer [facility])     Past Surgical History:  Procedure Laterality Date   BACK SURGERY     Lumbar   CATARACT EXTRACTION W/PHACO Right 07/10/2017   Procedure: CATARACT EXTRACTION PHACO AND INTRAOCULAR LENS PLACEMENT ([place]);  Surgeon: Birder Robson, MD;  Location: [facility] ORS;  Service: Ophthalmology;  Laterality: Right;  [place] 00:29.2 AP% 16.5 CDE 4.83 Fluid Pack Lot # [id number]   COLONOSCOPY     EYE SURGERY Bilateral    cataract extraction   HERNIA REPAIR     INGUINAL   PROSTATE BIOPSY N/A 04/08/2020   Procedure: PROSTATE BIOPSY [name];  Surgeon: Royston Cowper, MD;  Location: [facility] ORS;  Service: Urology;  Laterality: N/A;   TOTAL KNEE ARTHROPLASTY Left 03/19/2018   Procedure: TOTAL KNEE ARTHROPLASTY;  Surgeon:  Thornton Park, MD;  Location: [facility] ORS;  Service: Orthopedics;  Laterality: Left;    Family History  Problem Relation Age of Onset   Diabetes Father    Breast cancer Sister     Social History   Socioeconomic History   Marital status: Married    Spouse name: Not on file   Number of children: Not on file   Years of education: Not on file   Highest education level: Not on file  Occupational History   Not on file  Tobacco Use   Smoking status: Never   Smokeless tobacco: Never  Vaping Use   Vaping Use: Never used  Substance and Sexual Activity   Alcohol use: No   Drug use: Never   Sexual activity: Not on file  Other Topics Concern   Not on file  Social History Narrative   Independent at baseline.  Lives at home with his wife   Social Determinants of Radio broadcast assistant Strain: Not on file  Food Insecurity: Not on file  Transportation Needs: Not on file  Physical Activity: Not on file  Stress: Not on file  Social Connections: Not on file  Intimate Partner Violence: Not on file     Current Outpatient Medications:    amLODipine (NORVASC) 5 MG tablet, Take 5 mg by mouth daily. , Disp: , Rfl:    aspirin EC 81 MG tablet, Take 81 mg by mouth daily., Disp: , Rfl:  cholecalciferol (VITAMIN D) 1000 units tablet, Take 1,000 Units by mouth daily., Disp: , Rfl:    dutasteride (AVODART) 0.5 MG capsule, Take 0.5 mg by mouth daily., Disp: , Rfl: 3   losartan (COZAAR) 100 MG tablet, Take 100 mg by mouth daily., Disp: , Rfl:    lovastatin (MEVACOR) 40 MG tablet, TAKE 1 TABLET BY MOUTH EVERY DAY, Disp: 90 tablet, Rfl: 3   metoprolol tartrate (LOPRESSOR) 50 MG tablet, TAKE 1 TABLET BY MOUTH EVERY 12 HOURS, Disp: 180 tablet, Rfl: 2   Misc Natural Products (PROSTATE SUPPORT PO), Take 2 capsules by mouth daily., Disp: , Rfl:    NOVOLOG FLEXPEN 100 UNIT/ML FlexPen, Inject 7 Units into the skin 3 (three) times daily before meals., Disp: , Rfl: 0   Omega-3 Fatty Acids (FISH OIL  PO), Take 1 capsule by mouth daily. , Disp: , Rfl:    Relugolix (ORGOVYX) 120 MG TABS, Take 1 tablet by mouth daily., Disp: , Rfl:    tamsulosin (FLOMAX) 0.4 MG CAPS capsule, Take 0.4 mg by mouth daily after breakfast. , Disp: , Rfl:    TRESIBA FLEXTOUCH 200 UNIT/ML SOPN, Inject 80 Units into the muscle daily before breakfast. , Disp: , Rfl: 2   TRULICITY 1.5 SW/1.0XN SOPN, Inject 1.5 mg into the muscle every Wednesday. , Disp: , Rfl: 1   vitamin B-12 (CYANOCOBALAMIN) 500 MCG tablet, Take 500 mcg by mouth daily., Disp: , Rfl:    No Known Allergies  ROS Review of Systems  Constitutional: Negative.  Negative for fatigue and weight loss.  HENT: Negative.    Eyes: Negative.  Negative for blurred vision.  Respiratory: Negative.    Cardiovascular: Negative.  Negative for chest pain.  Gastrointestinal: Negative.   Endocrine: Negative.  Negative for polydipsia, polyphagia and polyuria.  Genitourinary: Negative.   Musculoskeletal: Negative.   Skin: Negative.   Allergic/Immunologic: Negative.   Neurological: Negative.  Negative for dizziness.  Hematological: Negative.   Psychiatric/Behavioral: Negative.  Negative for confusion.   All other systems reviewed and are negative.    Objective:    Physical Exam Vitals reviewed.  Constitutional:      Appearance: Normal appearance.  HENT:     Mouth/Throat:     Mouth: Mucous membranes are moist.  Eyes:     Pupils: Pupils are equal, round, and reactive to light.  Neck:     Vascular: No carotid bruit.  Cardiovascular:     Rate and Rhythm: Normal rate and regular rhythm.     Pulses: Normal pulses.     Heart sounds: Normal heart sounds.  Pulmonary:     Effort: Pulmonary effort is normal.     Breath sounds: Normal breath sounds.  Abdominal:     General: Bowel sounds are normal.     Palpations: Abdomen is soft. There is no hepatomegaly, splenomegaly or mass.     Tenderness: There is no abdominal tenderness.     Hernia: No hernia is present.   Musculoskeletal:     Cervical back: Neck supple.     Right lower leg: No edema.     Left lower leg: No edema.  Skin:    Findings: No rash.  Neurological:     Mental Status: He is alert and oriented to person, place, and time.     Motor: No weakness.  Psychiatric:        Mood and Affect: Mood normal.        Behavior: Behavior normal.    BP (!) 150/80  Pulse 85   Ht 6\' 2"  (1.88 m)   Wt 247 lb 12.8 oz (112.4 kg)   BMI 31.82 kg/m  Wt Readings from Last 3 Encounters:  11/23/20 247 lb 12.8 oz (112.4 kg)  09/24/20 (P) 246 lb (111.6 kg)  08/26/20 246 lb 6.4 oz (111.8 kg)     Health Maintenance Due  Topic Date Due   FOOT EXAM  Never done   OPHTHALMOLOGY EXAM  Never done   Hepatitis C Screening  Never done   TETANUS/TDAP  08/06/2005   Zoster Vaccines- Shingrix (2 of 2) 08/02/2018   PNA vac Low Risk Adult (2 of 2 - PCV13) 03/23/2019    There are no preventive care reminders to display for this patient.  No results found for: TSH Lab Results  Component Value Date   WBC 3.8 (L) 08/11/2020   HGB 11.6 (L) 08/11/2020   HCT 35.2 (L) 08/11/2020   MCV 84.2 08/11/2020   PLT 137 (L) 08/11/2020   Lab Results  Component Value Date   NA 141 03/30/2020   K 3.9 03/30/2020   CO2 27 03/30/2020   GLUCOSE 135 (H) 03/30/2020   BUN 26 (H) 03/30/2020   CREATININE 1.70 (H) 05/13/2020   BILITOT 0.5 06/04/2018   ALKPHOS 57 06/04/2018   AST 26 06/04/2018   ALT 24 06/04/2018   PROT 7.6 06/04/2018   ALBUMIN 4.3 06/04/2018   CALCIUM 9.6 03/30/2020   ANIONGAP 10 03/30/2020   No results found for: CHOL No results found for: HDL No results found for: LDLCALC No results found for: TRIG No results found for: CHOLHDL Lab Results  Component Value Date   HGBA1C 7.3 (H) 03/06/2018      Assessment & Plan:   Problem List Items Addressed This Visit       Cardiovascular and Mediastinum   Essential hypertension - Primary     Patient denies any chest pain or shortness of breath there  is no history of palpitation or paroxysmal nocturnal dyspnea   patient was advised to follow low-salt low-cholesterol diet    ideally I want to keep systolic blood pressure below 130 mmHg, patient was asked to check blood pressure one times a week and give me a report on that.  Patient will be follow-up in 3 months  or earlier as needed, patient will call me back for any change in the cardiovascular symptoms Patient was advised to buy a book from local bookstore concerning blood pressure and read several chapters  every day.  This will be supplemented by some of the material we will give him from the office.  Patient should also utilize other resources like YouTube and Internet to learn more about the blood pressure and the diet.         Endocrine   Type 2 diabetes mellitus without complication, with long-term current use of insulin ([place])    - The patient's blood sugar is labile on med. - The patient will continue the current treatment regimen.  - I encouraged the patient to regularly check blood sugar.  - I encouraged the patient to monitor diet. I encouraged the patient to eat low-carb and low-sugar to help prevent blood sugar spikes.  - I encouraged the patient to continue following their prescribed treatment plan for diabetes - I informed the patient to get help if blood sugar drops below 54mg /dL, or if suddenly have trouble thinking clearly or breathing.  Patient was advised to buy a book on diabetes from a local bookstore  or from [employer].  Patient should read 2 chapters every day to keep the motivation going, this is in addition to some of the materials we provided them from the office.  There are other resources on the Internet like YouTube and wilkipedia to get an education on the diabetes         Musculoskeletal and Integument   Injury of quadriceps muscle    Suggest physical therapy         Genitourinary   Stage 3b chronic kidney disease ([place])    Referred to nephrologist          Other   Class 1 obesity with serious comorbidity and body mass index (BMI) of 31.0 to 31.9 in adult    - I encouraged the patient to lose weight.  - I educated them on making healthy dietary choices including eating more fruits and vegetables and less fried foods. - I encouraged the patient to exercise more, and educated on the benefits of exercise including weight loss, diabetes prevention, and hypertension prevention.   Dietary counseling with a registered dietician  Referral to a weight management support group (e.g. Weight Watchers, Overeaters Anonymous)  If your BMI is greater than 29 or you have gained more than 15 pounds you should work on weight loss.  Attend a healthy cooking class        Annual physical exam    Umbilical hernia.  There is 1+ pedal edema.  He had a quadriceps rupture on the right side.  Heart is regular chest is clear, abdominal soft nontender, patient was advised to lose weight follow low-cholesterol diet walk on a daily basis.  He does not smoke does not drink.        No orders of the defined types were placed in this encounter.   Follow-up: Return in about 3 months (around 02/23/2021).    Cletis Athens, MD

## 2020-12-29 ENCOUNTER — Inpatient Hospital Stay: Payer: Medicare HMO | Attending: Radiation Oncology

## 2020-12-29 DIAGNOSIS — C61 Malignant neoplasm of prostate: Secondary | ICD-10-CM | POA: Diagnosis present

## 2020-12-29 LAB — PSA: Prostatic Specific Antigen: 0.07 ng/mL (ref 0.00–4.00)

## 2020-12-31 ENCOUNTER — Ambulatory Visit
Admission: RE | Admit: 2020-12-31 | Discharge: 2020-12-31 | Disposition: A | Discharge status: Home / Self Care | Payer: Medicare HMO | Source: Ambulatory Visit | Attending: Radiation Oncology | Admitting: Radiation Oncology

## 2020-12-31 ENCOUNTER — Encounter: Payer: Self-pay | Admitting: Radiation Oncology

## 2020-12-31 VITALS — BP 143/79 | HR 78 | Wt 246.0 lb

## 2020-12-31 DIAGNOSIS — C61 Malignant neoplasm of prostate: Secondary | ICD-10-CM | POA: Diagnosis present

## 2020-12-31 DIAGNOSIS — Z923 Personal history of irradiation: Secondary | ICD-10-CM | POA: Diagnosis not present

## 2020-12-31 NOTE — Progress Notes (Signed)
Radiation Oncology Follow up Note  Name: Keith Howe   Date:   12/31/2020 MRN:  [id number] DOB: [birth year]    This 80 y.o. male presents to the clinic today for 42-month follow-up status post IMRT radiation therapy for Gleason 10 (5+5) adenocarcinoma the prostate presenting with a PSA of 5.7.  REFERRING PROVIDER: Cletis Athens, MD  HPI: Patient is a 80 year old male now out 4 months having completed IMRT radiation therapy for Gleason 10 adenocarcinoma the prostate.  Initial PSA was 5.7 seen today in routine follow-up he is doing well specifically denies any increased lower urinary tract symptoms diarrhea fatigue.  His most recent PSA is 0.07.Marland Kitchen  He is currently on Avodart.  COMPLICATIONS OF TREATMENT: none  FOLLOW UP COMPLIANCE: keeps appointments   PHYSICAL EXAM:  BP (!) 143/79   Pulse 78   Wt 246 lb (111.6 kg)   BMI 31.58 kg/m  Well-developed well-nourished patient in NAD. HEENT reveals PERLA, EOMI, discs not visualized.  Oral cavity is clear. No oral mucosal lesions are identified. Neck is clear without evidence of cervical or supraclavicular adenopathy. Lungs are clear to A&P. Cardiac examination is essentially unremarkable with regular rate and rhythm without murmur rub or thrill. Abdomen is benign with no organomegaly or masses noted. Motor sensory and DTR levels are equal and symmetric in the upper and lower extremities. Cranial nerves II through XII are grossly intact. Proprioception is intact. No peripheral adenopathy or edema is identified. No motor or sensory levels are noted. Crude visual fields are within normal range.  RADIOLOGY RESULTS: No current films to review  PLAN: Present time patient is doing well under excellent biochemical control of his prostate cancer.  Very low side effect profile.  I have asked to see him back in 6 months for follow-up with a PSA prior to that visit.  Patient knows to call with any concerns.  I would like to take this opportunity to thank  you for allowing me to participate in the care of your patient.Noreene Filbert, MD

## 2021-02-01 ENCOUNTER — Other Ambulatory Visit: Payer: Self-pay | Admitting: Internal Medicine

## 2021-02-01 DIAGNOSIS — E118 Type 2 diabetes mellitus with unspecified complications: Secondary | ICD-10-CM

## 2021-02-23 ENCOUNTER — Other Ambulatory Visit: Payer: Self-pay

## 2021-02-23 ENCOUNTER — Ambulatory Visit (INDEPENDENT_AMBULATORY_CARE_PROVIDER_SITE_OTHER): Payer: Medicare HMO | Admitting: Internal Medicine

## 2021-02-23 ENCOUNTER — Encounter: Payer: Self-pay | Admitting: Internal Medicine

## 2021-02-23 VITALS — BP 136/76 | HR 83 | Ht 74.4 in | Wt 251.2 lb

## 2021-02-23 DIAGNOSIS — E669 Obesity, unspecified: Secondary | ICD-10-CM

## 2021-02-23 DIAGNOSIS — I10 Essential (primary) hypertension: Secondary | ICD-10-CM

## 2021-02-23 DIAGNOSIS — N1832 Chronic kidney disease, stage 3b: Secondary | ICD-10-CM | POA: Diagnosis not present

## 2021-02-23 DIAGNOSIS — Z794 Long term (current) use of insulin: Secondary | ICD-10-CM | POA: Diagnosis not present

## 2021-02-23 DIAGNOSIS — E119 Type 2 diabetes mellitus without complications: Secondary | ICD-10-CM | POA: Diagnosis not present

## 2021-02-23 DIAGNOSIS — Z6831 Body mass index (BMI) 31.0-31.9, adult: Secondary | ICD-10-CM

## 2021-02-23 LAB — POCT GLYCOSYLATED HEMOGLOBIN (HGB A1C): HbA1c POC (<> result, manual entry): 5.6 % (ref 4.0–5.6)

## 2021-02-23 NOTE — Progress Notes (Signed)
Established Patient Office Visit  Subjective:  Patient ID: Keith Howe, male    DOB: [birth year]  Age: 80 y.o. MRN: [id number]  CC:  Chief Complaint  Patient presents with   Diabetes    Diabetes   Keith Howe presents for general checkup patient denies any chest pain or shortness of breath.  There is no palpitation no swelling of the legs.  Does not smoke does not drink.  Past Medical History:  Diagnosis Date   BPH (benign prostatic hyperplasia)    Cancer (HCC)    Chronic kidney disease    STAGE 3   Diabetes mellitus without complication (HCC)    HOH (hard of hearing)    AIDS   Hypertension    Inguinal hernia    Pain    CHRONIC LBP   Prostate cancer [facility])     Past Surgical History:  Procedure Laterality Date   BACK SURGERY     Lumbar   CATARACT EXTRACTION W/PHACO Right 07/10/2017   Procedure: CATARACT EXTRACTION PHACO AND INTRAOCULAR LENS PLACEMENT ([place]);  Surgeon: Birder Robson, MD;  Location: [facility] ORS;  Service: Ophthalmology;  Laterality: Right;  [place] 00:29.2 AP% 16.5 CDE 4.83 Fluid Pack Lot # [id number]   COLONOSCOPY     EYE SURGERY Bilateral    cataract extraction   HERNIA REPAIR     INGUINAL   PROSTATE BIOPSY N/A 04/08/2020   Procedure: PROSTATE BIOPSY [name];  Surgeon: Royston Cowper, MD;  Location: [facility] ORS;  Service: Urology;  Laterality: N/A;   TOTAL KNEE ARTHROPLASTY Left 03/19/2018   Procedure: TOTAL KNEE ARTHROPLASTY;  Surgeon: Thornton Park, MD;  Location: [facility] ORS;  Service: Orthopedics;  Laterality: Left;    Family History  Problem Relation Age of Onset   Diabetes Father    Breast cancer Sister     Social History   Socioeconomic History   Marital status: Married    Spouse name: Not on file   Number of children: Not on file   Years of education: Not on file   Highest education level: Not on file  Occupational History   Not on file  Tobacco Use   Smoking status: Never   Smokeless tobacco: Never  Vaping Use   Vaping  Use: Never used  Substance and Sexual Activity   Alcohol use: No   Drug use: Never   Sexual activity: Not on file  Other Topics Concern   Not on file  Social History Narrative   Independent at baseline.  Lives at home with his wife   Social Determinants of Radio broadcast assistant Strain: Not on file  Food Insecurity: Not on file  Transportation Needs: Not on file  Physical Activity: Not on file  Stress: Not on file  Social Connections: Not on file  Intimate Partner Violence: Not on file     Current Outpatient Medications:    amLODipine (NORVASC) 5 MG tablet, Take 5 mg by mouth daily. , Disp: , Rfl:    aspirin EC 81 MG tablet, Take 81 mg by mouth daily., Disp: , Rfl:    cholecalciferol (VITAMIN D) 1000 units tablet, Take 1,000 Units by mouth daily., Disp: , Rfl:    dutasteride (AVODART) 0.5 MG capsule, Take 0.5 mg by mouth daily., Disp: , Rfl: 3   losartan (COZAAR) 100 MG tablet, Take 100 mg by mouth daily., Disp: , Rfl:    lovastatin (MEVACOR) 40 MG tablet, TAKE 1 TABLET BY MOUTH EVERY DAY, Disp: 90 tablet, Rfl: 3  metoprolol tartrate (LOPRESSOR) 50 MG tablet, TAKE 1 TABLET BY MOUTH EVERY 12 HOURS, Disp: 180 tablet, Rfl: 2   Misc Natural Products (PROSTATE SUPPORT PO), Take 2 capsules by mouth daily., Disp: , Rfl:    NOVOLOG FLEXPEN 100 UNIT/ML FlexPen, Inject 7 Units into the skin 3 (three) times daily before meals., Disp: , Rfl: 0   Omega-3 Fatty Acids (FISH OIL PO), Take 1 capsule by mouth daily. , Disp: , Rfl:    Relugolix (ORGOVYX) 120 MG TABS, Take 1 tablet by mouth daily., Disp: , Rfl:    tamsulosin (FLOMAX) 0.4 MG CAPS capsule, Take 0.4 mg by mouth daily after breakfast. , Disp: , Rfl:    TRESIBA FLEXTOUCH 200 UNIT/ML SOPN, Inject 80 Units into the muscle daily before breakfast. , Disp: , Rfl: 2   TRULICITY 1.5 GB/1.5VV SOPN, Inject 1.5 mg into the muscle every Wednesday. , Disp: , Rfl: 1   vitamin B-12 (CYANOCOBALAMIN) 500 MCG tablet, Take 500 mcg by mouth daily.,  Disp: , Rfl:    No Known Allergies  ROS Review of Systems  Constitutional: Negative.   HENT: Negative.    Eyes: Negative.   Respiratory: Negative.    Cardiovascular: Negative.   Gastrointestinal: Negative.   Endocrine: Negative.   Genitourinary: Negative.   Musculoskeletal: Negative.   Skin: Negative.   Allergic/Immunologic: Negative.   Neurological: Negative.   Hematological: Negative.   Psychiatric/Behavioral: Negative.    All other systems reviewed and are negative.    Objective:    Physical Exam Vitals reviewed.  Constitutional:      Appearance: Normal appearance.  HENT:     Mouth/Throat:     Mouth: Mucous membranes are moist.  Eyes:     Pupils: Pupils are equal, round, and reactive to light.  Neck:     Vascular: No carotid bruit.  Cardiovascular:     Rate and Rhythm: Normal rate and regular rhythm.     Pulses: Normal pulses.     Heart sounds: Normal heart sounds.  Pulmonary:     Effort: Pulmonary effort is normal.     Breath sounds: Normal breath sounds.  Abdominal:     General: Bowel sounds are normal.     Palpations: Abdomen is soft. There is no hepatomegaly, splenomegaly or mass.     Tenderness: There is no abdominal tenderness.     Hernia: No hernia is present.  Musculoskeletal:     Cervical back: Neck supple.     Right lower leg: No edema.     Left lower leg: No edema.  Skin:    Findings: No rash.  Neurological:     Mental Status: He is alert and oriented to person, place, and time.     Motor: No weakness.  Psychiatric:        Mood and Affect: Mood normal.        Behavior: Behavior normal.    BP 136/76   Pulse 83   Ht 6' 2.4" (1.89 m)   Wt 251 lb 3.2 oz (113.9 kg)   BMI 31.91 kg/m  Wt Readings from Last 3 Encounters:  02/23/21 251 lb 3.2 oz (113.9 kg)  12/31/20 246 lb (111.6 kg)  11/23/20 247 lb 12.8 oz (112.4 kg)     Health Maintenance Due  Topic Date Due   FOOT EXAM  Never done   Hepatitis C Screening  Never done    TETANUS/TDAP  08/06/2005    There are no preventive care reminders to display for this patient.  No results found for: TSH Lab Results  Component Value Date   WBC 3.8 (L) 08/11/2020   HGB 11.6 (L) 08/11/2020   HCT 35.2 (L) 08/11/2020   MCV 84.2 08/11/2020   PLT 137 (L) 08/11/2020   Lab Results  Component Value Date   NA 141 03/30/2020   K 3.9 03/30/2020   CO2 27 03/30/2020   GLUCOSE 135 (H) 03/30/2020   BUN 26 (H) 03/30/2020   CREATININE 1.70 (H) 05/13/2020   BILITOT 0.5 06/04/2018   ALKPHOS 57 06/04/2018   AST 26 06/04/2018   ALT 24 06/04/2018   PROT 7.6 06/04/2018   ALBUMIN 4.3 06/04/2018   CALCIUM 9.6 03/30/2020   ANIONGAP 10 03/30/2020   No results found for: CHOL No results found for: HDL No results found for: LDLCALC No results found for: TRIG No results found for: CHOLHDL Lab Results  Component Value Date   HGBA1C 5.6 02/23/2021      Assessment & Plan:   Problem List Items Addressed This Visit       Cardiovascular and Mediastinum   Essential hypertension    The following hypertensive lifestyle modification were recommended and discussed:  1. Limiting alcohol intake to less than 1 oz/day of ethanol:(24 oz of beer or 8 oz of wine or 2 oz of 100-proof whiskey). 2. Take baby ASA 81 mg daily. 3. Importance of regular aerobic exercise and losing weight. 4. Reduce dietary saturated fat and cholesterol intake for overall cardiovascular health. 5. Maintaining adequate dietary potassium, calcium, and magnesium intake. 6. Regular monitoring of the blood pressure. 7. Reduce sodium intake to less than 100 mmol/day (less than 2.3 gm of sodium or less than 6 gm of sodium choride)         Endocrine   Type 2 diabetes mellitus without complication, with long-term current use of insulin ([place])    - The patient's blood sugar is labile on med. - The patient will continue the current treatment regimen.  - I encouraged the patient to regularly check blood sugar.  - I  encouraged the patient to monitor diet. I encouraged the patient to eat low-carb and low-sugar to help prevent blood sugar spikes.  - I encouraged the patient to continue following their prescribed treatment plan for diabetes - I informed the patient to get help if blood sugar drops below 54mg /dL, or if suddenly have trouble thinking clearly or breathing.  Patient was advised to buy a book on diabetes from a local bookstore or from [place]  Patient should read 2 chapters every day to keep the motivation going, this is in addition to some of the materials we provided them from the office.  There are other resources on the Internet like YouTube and wilkipedia to get an education on the diabetes        Genitourinary   Stage 3b chronic kidney disease ([place])    Try to lose weight walk daily, avoid NSAIDs        Other   Class 1 obesity with serious comorbidity and body mass index (BMI) of 31.0 to 31.9 in adult    Behavioral modification strategies: increasing lean protein intake, decreasing simple carbohydrates, increasing vegetables, increasing water intake, decreasing eating out, no skipping meals, meal planning and cooking strategies, keeping healthy foods in the home and planning for success.      Other Visit Diagnoses     Type 2 diabetes mellitus without complication, without long-term current use of insulin ([place])    -  Primary  Relevant Orders   POCT HgB A1C (Completed)       No orders of the defined types were placed in this encounter.   Follow-up: No follow-ups on file.    Cletis Athens, MD

## 2021-02-23 NOTE — Assessment & Plan Note (Signed)
Try to lose weight walk daily, avoid NSAIDs

## 2021-02-23 NOTE — Assessment & Plan Note (Signed)

## 2021-02-23 NOTE — Assessment & Plan Note (Signed)

## 2021-02-23 NOTE — Assessment & Plan Note (Signed)
Behavioral modification strategies: increasing lean protein intake, decreasing simple carbohydrates, increasing vegetables, increasing water intake, decreasing eating out, no skipping meals, meal planning and cooking strategies, keeping healthy foods in the home and planning for success. 

## 2021-02-24 ENCOUNTER — Encounter: Payer: Self-pay | Admitting: Internal Medicine

## 2021-03-25 LAB — HM DIABETES EYE EXAM

## 2021-05-12 ENCOUNTER — Ambulatory Visit (INDEPENDENT_AMBULATORY_CARE_PROVIDER_SITE_OTHER): Payer: Medicare HMO

## 2021-05-12 DIAGNOSIS — Z0000 Encounter for general adult medical examination without abnormal findings: Secondary | ICD-10-CM | POA: Diagnosis not present

## 2021-05-12 NOTE — Progress Notes (Signed)
Subjective:   Keith Howe is a 81 y.o. male who presents for Medicare Annual/Subsequent preventive examination. I discussed the limitations of evaluation and management by telemedicine and the availability of in person appointments. The patient expressed understanding and agreed to proceed.   Visit performed by audio   Patient location: Home  Provider location: Home  Review of Systems    N/A       Objective:    There were no vitals filed for this visit. There is no height or weight on file to calculate BMI.  Advanced Directives 05/12/2021 09/24/2020 06/04/2020 04/08/2020 03/29/2020 06/04/2018 03/19/2018  Does Patient Have a Medical Advance Directive? No No No No No No No  Would patient like information on creating a medical advance directive? - No - Patient declined No - Patient declined No - Patient declined No - Patient declined No - Patient declined No - Patient declined    Current Medications (verified) Outpatient Encounter Medications as of 05/12/2021  Medication Sig   amLODipine (NORVASC) 5 MG tablet Take 5 mg by mouth daily.    aspirin EC 81 MG tablet Take 81 mg by mouth daily.   cholecalciferol (VITAMIN D) 1000 units tablet Take 1,000 Units by mouth daily.   dutasteride (AVODART) 0.5 MG capsule Take 0.5 mg by mouth daily.   losartan (COZAAR) 100 MG tablet Take 100 mg by mouth daily.   lovastatin (MEVACOR) 40 MG tablet TAKE 1 TABLET BY MOUTH EVERY DAY   metoprolol tartrate (LOPRESSOR) 50 MG tablet TAKE 1 TABLET BY MOUTH EVERY 12 HOURS   Misc Natural Products (PROSTATE SUPPORT PO) Take 2 capsules by mouth daily.   NOVOLOG FLEXPEN 100 UNIT/ML FlexPen Inject 7 Units into the skin 3 (three) times daily before meals.   Omega-3 Fatty Acids (FISH OIL PO) Take 1 capsule by mouth daily.    Relugolix (ORGOVYX) 120 MG TABS Take 1 tablet by mouth daily.   tamsulosin (FLOMAX) 0.4 MG CAPS capsule Take 0.4 mg by mouth daily after breakfast.    TRESIBA FLEXTOUCH 200 UNIT/ML SOPN  Inject 80 Units into the muscle daily before breakfast.    TRULICITY 1.5 DD/2.2GU SOPN Inject 1.5 mg into the muscle every Wednesday.    vitamin B-12 (CYANOCOBALAMIN) 500 MCG tablet Take 500 mcg by mouth daily.   No facility-administered encounter medications on file as of 05/12/2021.    Allergies (verified) Patient has no known allergies.   History: Past Medical History:  Diagnosis Date   BPH (benign prostatic hyperplasia)    Cancer (HCC)    Chronic kidney disease    STAGE 3   Diabetes mellitus without complication (HCC)    HOH (hard of hearing)    AIDS   Hypertension    Inguinal hernia    Pain    CHRONIC LBP   Prostate cancer [facility])    Past Surgical History:  Procedure Laterality Date   BACK SURGERY     Lumbar   CATARACT EXTRACTION W/PHACO Right 07/10/2017   Procedure: CATARACT EXTRACTION PHACO AND INTRAOCULAR LENS PLACEMENT ([place]);  Surgeon: Birder Robson, MD;  Location: [facility] ORS;  Service: Ophthalmology;  Laterality: Right;  [place] 00:29.2 AP% 16.5 CDE 4.83 Fluid Pack Lot # [id number]   COLONOSCOPY     EYE SURGERY Bilateral    cataract extraction   HERNIA REPAIR     INGUINAL   PROSTATE BIOPSY N/A 04/08/2020   Procedure: PROSTATE BIOPSY [name];  Surgeon: Royston Cowper, MD;  Location: [facility] ORS;  Service: Urology;  Laterality: N/A;   TOTAL KNEE ARTHROPLASTY Left 03/19/2018   Procedure: TOTAL KNEE ARTHROPLASTY;  Surgeon: Thornton Park, MD;  Location: [facility] ORS;  Service: Orthopedics;  Laterality: Left;   Family History  Problem Relation Age of Onset   Diabetes Father    Breast cancer Sister    Social History   Socioeconomic History   Marital status: Married    Spouse name: Not on file   Number of children: Not on file   Years of education: Not on file   Highest education level: Some college, no degree  Occupational History   Not on file  Tobacco Use   Smoking status: Never   Smokeless tobacco: Never  Vaping Use   Vaping Use: Never used  Substance and  Sexual Activity   Alcohol use: No   Drug use: Never   Sexual activity: Not Currently  Other Topics Concern   Not on file  Social History Narrative   Independent at baseline.  Lives at home with his wife   Social Determinants of Radio broadcast assistant Strain: Low Risk    Difficulty of Paying Living Expenses: Not hard at all  Food Insecurity: No Food Insecurity   Worried About Charity fundraiser in the Last Year: Never true   Arboriculturist in the Last Year: Never true  Transportation Needs: No Transportation Needs   Lack of Transportation (Medical): No   Lack of Transportation (Non-Medical): No  Physical Activity: Insufficiently Active   Days of Exercise per Week: 7 days   Minutes of Exercise per Session: 10 min  Stress: No Stress Concern Present   Feeling of Stress : Not at all  Social Connections: Socially Integrated   Frequency of Communication with Friends and Family: More than three times a week   Frequency of Social Gatherings with Friends and Family: Once a week   Attends Religious Services: More than 4 times per year   Active Member of [employer] or Organizations: Yes   Attends Music therapist: More than 4 times per year   Marital Status: Married    Tobacco Counseling Counseling given: Not Answered   Clinical Intake:  Pre-visit preparation completed: Yes  Pain : No/denies pain     Diabetes: Yes  How often do you need to have someone help you when you read instructions, pamphlets, or other written materials from your doctor or pharmacy?: 1 - Never What is the last grade level you completed in school?: 1 year of college  Diabetic? Yes  Interpreter Needed?: No  Information entered by :: Anson [place] CMA   Activities of Daily Living In your present state of health, do you have any difficulty performing the following activities: 05/12/2021  Hearing? Y  Comment Pt has hearing aids  Vision? N  Difficulty concentrating or making decisions?  N  Walking or climbing stairs? N  Dressing or bathing? N  Doing errands, shopping? N  Preparing Food and eating ? N  Using the Toilet? N  In the past six months, have you accidently leaked urine? N  Do you have problems with loss of bowel control? N  Managing your Medications? N  Managing your Finances? N  Housekeeping or managing your Housekeeping? N  Some recent data might be hidden    Patient Care Team: Cletis Athens, MD as PCP - General (Internal Medicine)  Indicate any recent Medical Services you may have received from other than [facility] providers in the past year (date may be  approximate).     Assessment:   This is a routine wellness examination for [facility].  Hearing/Vision screen No results found.  Dietary issues and exercise activities discussed:     Goals Addressed   None    Depression Screen PHQ 2/9 Scores 05/12/2021 11/23/2020  PHQ - 2 Score 0 0    Fall Risk Fall Risk  05/12/2021 11/23/2020  Falls in the past year? 0 1  Number falls in past yr: 0 0  Injury with Fall? 0 1  Comment - Knee pain  Risk for fall due to : No Fall Risks History of fall(s)  Follow up Falls evaluation completed Falls evaluation completed    [place]:  Any stairs in or around the home? Yes  If so, are there any without handrails? No  Home free of loose throw rugs in walkways, pet beds, electrical cords, etc? Yes  Adequate lighting in your home to reduce risk of falls? Yes   ASSISTIVE DEVICES UTILIZED TO PREVENT FALLS:  Life alert? No  Use of a cane, walker or w/c? Yes Pt uses cane at times due to knee arthritis Grab bars in the bathroom? No  Shower chair or bench in shower? No  Elevated toilet seat or a handicapped toilet? Yes   TIMED UP AND GO:  Was the test performed? No .  Length of time to ambulate 10 feet: 0 sec.    Cognitive Function:     6CIT Screen 05/12/2021  What Year? 0 points  What month? 0 points  What time? 0 points   Count back from 20 0 points  Months in reverse 0 points  Repeat phrase 0 points  Total Score 0    Immunizations Immunization History  Administered Date(s) Administered   Fluad Quad(high Dose 65+) 02/18/2020, 02/21/2021   Influenza,inj,quad, With Preservative 03/20/2016   Influenza-Unspecified 02/05/2018   PFIZER(Purple Top)SARS-COV-2 Vaccination 06/02/2019, 06/23/2019, 02/09/2020, 09/24/2020, 02/21/2021   Pneumococcal Polysaccharide-23 03/22/2018   Td 08/07/1995   Zoster Recombinat (Shingrix) 06/07/2018   Zoster, Unspecified 05/08/2020    TDAP status: Due, Education has been provided regarding the importance of this vaccine. Advised may receive this vaccine at local pharmacy or Health Dept. Aware to provide a copy of the vaccination record if obtained from local pharmacy or Health Dept. Verbalized acceptance and understanding.  Flu Vaccine status: Up to date  Pneumococcal vaccine status: Due, Education has been provided regarding the importance of this vaccine. Advised may receive this vaccine at local pharmacy or Health Dept. Aware to provide a copy of the vaccination record if obtained from local pharmacy or Health Dept. Verbalized acceptance and understanding.  Covid-19 vaccine status: Completed vaccines  Qualifies for Shingles Vaccine? Yes   Zostavax completed Yes   Shingrix Completed?: Yes  Screening Tests Health Maintenance  Topic Date Due   FOOT EXAM  Never done   Hepatitis C Screening  Never done   TETANUS/TDAP  08/06/2005   Pneumonia Vaccine 63+ Years old (2 - PCV) 03/23/2019   COVID-19 Vaccine (6 - Booster for Pfizer series) 04/18/2021   OPHTHALMOLOGY EXAM  05/08/2021   HEMOGLOBIN A1C  08/24/2021   INFLUENZA VACCINE  Completed   Zoster Vaccines- Shingrix  Completed   HPV VACCINES  Aged Out    Health Maintenance  Health Maintenance Due  Topic Date Due   FOOT EXAM  Never done   Hepatitis C Screening  Never done   TETANUS/TDAP  08/06/2005   Pneumonia  Vaccine 65+ Years  old (2 - PCV) 03/23/2019   COVID-19 Vaccine (6 - Booster for Pfizer series) 04/18/2021   OPHTHALMOLOGY EXAM  05/08/2021    Colorectal cancer screening: No longer required.   Lung Cancer Screening: (Low Dose CT Chest recommended if Age 80-80 years, 30 pack-year currently smoking OR have quit w/in 15years.) does not qualify.   Lung Cancer Screening Referral: No  Additional Screening:  Hepatitis C Screening: does qualify; Completed No  Vision Screening: Recommended annual ophthalmology exams for early detection of glaucoma and other disorders of the eye. Is the patient up to date with their annual eye exam?  Yes  Who is the provider or what is the name of the office in which the patient attends annual eye exams? [facility] If pt is not established with a provider, would they like to be referred to a provider to establish care? No .   Dental Screening: Recommended annual dental exams for proper oral hygiene  Community Resource Referral / Chronic Care Management: CRR required this visit?  No   CCM required this visit?  No      Plan:     I have personally reviewed and noted the following in the patients chart:   Medical and social history Use of alcohol, tobacco or illicit drugs  Current medications and supplements including opioid prescriptions. Patient is not currently taking opioid prescriptions. Functional ability and status Nutritional status Physical activity Advanced directives List of other physicians Hospitalizations, surgeries, and ER visits in previous 12 months Vitals Screenings to include cognitive, depression, and falls Referrals and appointments  In addition, I have reviewed and discussed with patient certain preventive protocols, quality metrics, and best practice recommendations. A written personalized care plan for preventive services as well as general preventive health recommendations were provided to patient.    Keith Howe  , Thank you for taking time to come for your Medicare Wellness Visit. I appreciate your ongoing commitment to your health goals. Please review the following plan we discussed and let me know if I can assist you in the future.   These are the goals we discussed:  Goals   None     This is a list of the screening recommended for you and due dates:  Health Maintenance  Topic Date Due   Complete foot exam   Never done   Hepatitis C Screening: USPSTF Recommendation to screen - Ages 72-79 yo.  Never done   Tetanus Vaccine  08/06/2005   Pneumonia Vaccine (2 - PCV) 03/23/2019   COVID-19 Vaccine (6 - Booster for Pfizer series) 04/18/2021   Eye exam for diabetics  05/08/2021   Hemoglobin A1C  08/24/2021   Flu Shot  Completed   Zoster (Shingles) Vaccine  Completed   HPV Vaccine  Aged [address], [place]   05/12/2021   Nurse Notes: Patient is aware of care gaps, Keith Howe plans on coming into the office to receive his tdap and pneumonia vaccine at his next office visit.

## 2021-05-13 NOTE — Progress Notes (Signed)
I have reviewed this visit and agree with the documentation.   

## 2021-05-31 ENCOUNTER — Other Ambulatory Visit: Payer: Self-pay

## 2021-05-31 ENCOUNTER — Encounter: Payer: Self-pay | Admitting: Internal Medicine

## 2021-05-31 ENCOUNTER — Ambulatory Visit (INDEPENDENT_AMBULATORY_CARE_PROVIDER_SITE_OTHER): Payer: Medicare HMO | Admitting: Internal Medicine

## 2021-05-31 VITALS — Ht 74.4 in | Wt 251.2 lb

## 2021-05-31 DIAGNOSIS — Z96652 Presence of left artificial knee joint: Secondary | ICD-10-CM | POA: Diagnosis not present

## 2021-05-31 DIAGNOSIS — Z794 Long term (current) use of insulin: Secondary | ICD-10-CM

## 2021-05-31 DIAGNOSIS — E119 Type 2 diabetes mellitus without complications: Secondary | ICD-10-CM

## 2021-05-31 DIAGNOSIS — N1832 Chronic kidney disease, stage 3b: Secondary | ICD-10-CM

## 2021-05-31 DIAGNOSIS — E669 Obesity, unspecified: Secondary | ICD-10-CM

## 2021-05-31 DIAGNOSIS — I10 Essential (primary) hypertension: Secondary | ICD-10-CM | POA: Diagnosis not present

## 2021-05-31 DIAGNOSIS — S76109A Unspecified injury of unspecified quadriceps muscle, fascia and tendon, initial encounter: Secondary | ICD-10-CM

## 2021-05-31 DIAGNOSIS — Z6831 Body mass index (BMI) 31.0-31.9, adult: Secondary | ICD-10-CM

## 2021-05-31 LAB — GLUCOSE, POCT (MANUAL RESULT ENTRY): POC Glucose: 95 mg/dl (ref 70–99)

## 2021-05-31 NOTE — Assessment & Plan Note (Signed)

## 2021-05-31 NOTE — Assessment & Plan Note (Signed)
Stable at the present time. 

## 2021-05-31 NOTE — Progress Notes (Signed)
Established Patient Office Visit  Subjective:  Patient ID: Keith Howe, male    DOB: 1940-06-30  Age: 81 y.o. MRN: [id number]  CC:  Chief Complaint  Patient presents with   Diabetes    Diabetes   Keith Howe presents for general checkup. C/o pain in rt knee    Past Medical History:  Diagnosis Date   BPH (benign prostatic hyperplasia)    Cancer (HCC)    Chronic kidney disease    STAGE 3   Diabetes mellitus without complication (HCC)    HOH (hard of hearing)    AIDS   Hypertension    Inguinal hernia    Pain    CHRONIC LBP   Prostate cancer [facility])     Past Surgical History:  Procedure Laterality Date   BACK SURGERY     Lumbar   CATARACT EXTRACTION W/PHACO Right 07/10/2017   Procedure: CATARACT EXTRACTION PHACO AND INTRAOCULAR LENS PLACEMENT ([place]);  Surgeon: Birder Robson, MD;  Location: [facility] ORS;  Service: Ophthalmology;  Laterality: Right;  [place] 00:29.2 AP% 16.5 CDE 4.83 Fluid Pack Lot # [id number]   COLONOSCOPY     EYE SURGERY Bilateral    cataract extraction   HERNIA REPAIR     INGUINAL   PROSTATE BIOPSY N/A 04/08/2020   Procedure: PROSTATE BIOPSY [name];  Surgeon: Royston Cowper, MD;  Location: [facility] ORS;  Service: Urology;  Laterality: N/A;   TOTAL KNEE ARTHROPLASTY Left 03/19/2018   Procedure: TOTAL KNEE ARTHROPLASTY;  Surgeon: Thornton Park, MD;  Location: [facility] ORS;  Service: Orthopedics;  Laterality: Left;    Family History  Problem Relation Age of Onset   Diabetes Father    Breast cancer Sister     Social History   Socioeconomic History   Marital status: Married    Spouse name: Not on file   Number of children: Not on file   Years of education: Not on file   Highest education level: Some college, no degree  Occupational History   Not on file  Tobacco Use   Smoking status: Never   Smokeless tobacco: Never  Vaping Use   Vaping Use: Never used  Substance and Sexual Activity   Alcohol use: No   Drug use: Never   Sexual  activity: Not Currently  Other Topics Concern   Not on file  Social History Narrative   Independent at baseline.  Lives at home with his wife   Social Determinants of Radio broadcast assistant Strain: Low Risk    Difficulty of Paying Living Expenses: Not hard at all  Food Insecurity: No Food Insecurity   Worried About Charity fundraiser in the Last Year: Never true   Arboriculturist in the Last Year: Never true  Transportation Needs: No Transportation Needs   Lack of Transportation (Medical): No   Lack of Transportation (Non-Medical): No  Physical Activity: Insufficiently Active   Days of Exercise per Week: 7 days   Minutes of Exercise per Session: 10 min  Stress: No Stress Concern Present   Feeling of Stress : Not at all  Social Connections: Socially Integrated   Frequency of Communication with Friends and Family: More than three times a week   Frequency of Social Gatherings with Friends and Family: Once a week   Attends Religious Services: More than 4 times per year   Active Member of [employer] or Organizations: Yes   Attends Archivist Meetings: More than 4 times per year  Marital Status: Married  Human resources officer Violence: Not At Risk   Fear of Current or Ex-Partner: No   Emotionally Abused: No   Physically Abused: No   Sexually Abused: No     Current Outpatient Medications:    amLODipine (NORVASC) 5 MG tablet, Take 5 mg by mouth daily. , Disp: , Rfl:    aspirin EC 81 MG tablet, Take 81 mg by mouth daily., Disp: , Rfl:    cholecalciferol (VITAMIN D) 1000 units tablet, Take 1,000 Units by mouth daily., Disp: , Rfl:    dutasteride (AVODART) 0.5 MG capsule, Take 0.5 mg by mouth daily., Disp: , Rfl: 3   losartan (COZAAR) 100 MG tablet, Take 100 mg by mouth daily., Disp: , Rfl:    lovastatin (MEVACOR) 40 MG tablet, TAKE 1 TABLET BY MOUTH EVERY DAY, Disp: 90 tablet, Rfl: 3   metoprolol tartrate (LOPRESSOR) 50 MG tablet, TAKE 1 TABLET BY MOUTH EVERY 12 HOURS, Disp:  180 tablet, Rfl: 2   Misc Natural Products (PROSTATE SUPPORT PO), Take 2 capsules by mouth daily., Disp: , Rfl:    NOVOLOG FLEXPEN 100 UNIT/ML FlexPen, Inject 7 Units into the skin 3 (three) times daily before meals., Disp: , Rfl: 0   Omega-3 Fatty Acids (FISH OIL PO), Take 1 capsule by mouth daily. , Disp: , Rfl:    Relugolix (ORGOVYX) 120 MG TABS, Take 1 tablet by mouth daily., Disp: , Rfl:    tamsulosin (FLOMAX) 0.4 MG CAPS capsule, Take 0.4 mg by mouth daily after breakfast. , Disp: , Rfl:    TRESIBA FLEXTOUCH 200 UNIT/ML SOPN, Inject 80 Units into the muscle daily before breakfast. , Disp: , Rfl: 2   TRULICITY 1.5 ID/7.8EU SOPN, Inject 1.5 mg into the muscle every Wednesday. , Disp: , Rfl: 1   vitamin B-12 (CYANOCOBALAMIN) 500 MCG tablet, Take 500 mcg by mouth daily., Disp: , Rfl:    No Known Allergies  ROS Review of Systems  Constitutional: Negative.   HENT: Negative.    Eyes: Negative.   Respiratory: Negative.    Cardiovascular: Negative.   Gastrointestinal: Negative.   Endocrine: Negative.   Genitourinary: Negative.   Musculoskeletal: Negative.   Skin: Negative.   Allergic/Immunologic: Negative.   Neurological: Negative.   Hematological: Negative.   Psychiatric/Behavioral: Negative.    All other systems reviewed and are negative.    Objective:    Physical Exam Vitals reviewed.  Constitutional:      Appearance: Normal appearance.  HENT:     Mouth/Throat:     Mouth: Mucous membranes are moist.  Eyes:     Pupils: Pupils are equal, round, and reactive to light.  Neck:     Vascular: No carotid bruit.  Cardiovascular:     Rate and Rhythm: Normal rate and regular rhythm.     Pulses: Normal pulses.     Heart sounds: Normal heart sounds.  Pulmonary:     Effort: Pulmonary effort is normal.     Breath sounds: Normal breath sounds.  Abdominal:     General: Bowel sounds are normal.     Palpations: Abdomen is soft. There is no hepatomegaly, splenomegaly or mass.      Tenderness: There is no abdominal tenderness.     Hernia: No hernia is present.  Musculoskeletal:     Cervical back: Neck supple.     Right lower leg: No edema.     Left lower leg: No edema.  Skin:    Findings: No rash.  Neurological:  Mental Status: He is alert and oriented to person, place, and time.     Motor: No weakness.  Psychiatric:        Mood and Affect: Mood normal.        Behavior: Behavior normal.    Ht 6' 2.4" (1.89 m)    Wt 251 lb 3.2 oz (113.9 kg)    BMI 31.91 kg/m  Wt Readings from Last 3 Encounters:  05/31/21 251 lb 3.2 oz (113.9 kg)  02/23/21 251 lb 3.2 oz (113.9 kg)  12/31/20 246 lb (111.6 kg)     Health Maintenance Due  Topic Date Due   FOOT EXAM  Never done   Hepatitis C Screening  Never done   TETANUS/TDAP  08/06/2005   Pneumonia Vaccine 57+ Years old (2 - PCV) 03/23/2019   COVID-19 Vaccine (6 - Booster for Pfizer series) 04/18/2021    There are no preventive care reminders to display for this patient.  No results found for: TSH Lab Results  Component Value Date   WBC 3.8 (L) 08/11/2020   HGB 11.6 (L) 08/11/2020   HCT 35.2 (L) 08/11/2020   MCV 84.2 08/11/2020   PLT 137 (L) 08/11/2020   Lab Results  Component Value Date   NA 141 03/30/2020   K 3.9 03/30/2020   CO2 27 03/30/2020   GLUCOSE 135 (H) 03/30/2020   BUN 26 (H) 03/30/2020   CREATININE 1.70 (H) 05/13/2020   BILITOT 0.5 06/04/2018   ALKPHOS 57 06/04/2018   AST 26 06/04/2018   ALT 24 06/04/2018   PROT 7.6 06/04/2018   ALBUMIN 4.3 06/04/2018   CALCIUM 9.6 03/30/2020   ANIONGAP 10 03/30/2020   No results found for: CHOL No results found for: HDL No results found for: LDLCALC No results found for: TRIG No results found for: CHOLHDL Lab Results  Component Value Date   HGBA1C 5.6 02/23/2021   Pt has h/o colon exam   Assessment & Plan:   Problem List Items Addressed This Visit       Cardiovascular and Mediastinum   Essential hypertension     Patient denies any  chest pain or shortness of breath there is no history of palpitation or paroxysmal nocturnal dyspnea   patient was advised to follow low-salt low-cholesterol diet    ideally I want to keep systolic blood pressure below 130 mmHg, patient was asked to check blood pressure one times a week and give me a report on that.  Patient will be follow-up in 3 months  or earlier as needed, patient will call me back for any change in the cardiovascular symptoms Patient was advised to buy a book from local bookstore concerning blood pressure and read several chapters  every day.  This will be supplemented by some of the material we will give him from the office.  Patient should also utilize other resources like YouTube and Internet to learn more about the blood pressure and the diet.        Endocrine   Type 2 diabetes mellitus without complication, with long-term current use of insulin ([place]) - Primary    - The patient's blood sugar is labile on med. - The patient will continue the current treatment regimen.  - I encouraged the patient to regularly check blood sugar.  - I encouraged the patient to monitor diet. I encouraged the patient to eat low-carb and low-sugar to help prevent blood sugar spikes.  - I encouraged the patient to continue following their prescribed treatment plan for  diabetes - I informed the patient to get help if blood sugar drops below 54mg /dL, or if suddenly have trouble thinking clearly or breathing.  Patient was advised to buy a book on diabetes from a local bookstore or from [place]  Patient should read 2 chapters every day to keep the motivation going, this is in addition to some of the materials we provided them from the office.  There are other resources on the Internet like YouTube and wilkipedia to get an education on the diabetes      Relevant Orders   POCT glucose (manual entry) (Completed)     Musculoskeletal and Integument   Injury of quadriceps muscle     Genitourinary    Stage 3b chronic kidney disease ([place])      Stable at the present time        Other   S/P TKR (total knee replacement) using cement, left   Class 1 obesity with serious comorbidity and body mass index (BMI) of 31.0 to 31.9 in adult    - I encouraged the patient to lose weight.  - I educated them on making healthy dietary choices including eating more fruits and vegetables and less fried foods. - I encouraged the patient to exercise more, and educated on the benefits of exercise including weight loss, diabetes prevention, and hypertension prevention.   Dietary counseling with a registered dietician  Referral to a weight management support group (e.g. Weight Watchers, Overeaters Anonymous)  If your BMI is greater than 29 or you have gained more than 15 pounds you should work on weight loss.  Attend a healthy cooking class        No orders of the defined types were placed in this encounter.   Follow-up: No follow-ups on file.    Cletis Athens, MD

## 2021-05-31 NOTE — Assessment & Plan Note (Signed)

## 2021-05-31 NOTE — Assessment & Plan Note (Signed)

## 2021-06-06 ENCOUNTER — Encounter: Payer: Self-pay | Admitting: *Deleted

## 2021-07-01 ENCOUNTER — Inpatient Hospital Stay: Payer: Medicare HMO | Attending: Radiation Oncology

## 2021-07-01 ENCOUNTER — Other Ambulatory Visit: Payer: Self-pay

## 2021-07-01 DIAGNOSIS — C61 Malignant neoplasm of prostate: Secondary | ICD-10-CM | POA: Diagnosis not present

## 2021-07-01 LAB — PSA: Prostatic Specific Antigen: 4.3 ng/mL — ABNORMAL HIGH (ref 0.00–4.00)

## 2021-07-04 ENCOUNTER — Other Ambulatory Visit: Payer: Self-pay | Admitting: *Deleted

## 2021-07-04 ENCOUNTER — Ambulatory Visit
Admission: RE | Admit: 2021-07-04 | Discharge: 2021-07-04 | Disposition: A | Discharge status: Home / Self Care | Payer: Medicare HMO | Source: Ambulatory Visit | Attending: Radiation Oncology | Admitting: Radiation Oncology

## 2021-07-04 ENCOUNTER — Other Ambulatory Visit: Payer: Self-pay

## 2021-07-04 DIAGNOSIS — C61 Malignant neoplasm of prostate: Secondary | ICD-10-CM | POA: Diagnosis not present

## 2021-07-04 DIAGNOSIS — Z923 Personal history of irradiation: Secondary | ICD-10-CM | POA: Diagnosis not present

## 2021-07-04 NOTE — Progress Notes (Signed)
Radiation Oncology Follow up Note  Name: Keith Howe   Date:   07/04/2021 MRN:  [id number] DOB: July 26, 1940    This 81 y.o. male presents to the clinic today for 53-month follow-up status post IMRT radiation therapy for Gleason 10 (5+5) adenocarcinoma the prostate presenting with a PSA of 5.7.  REFERRING PROVIDER: Cletis Athens, MD  HPI: Patient is a 81 year old male now out 10 months having completed IMRT radiation therapy for Gleason 10 adenocarcinoma presenting with a PSA of 5.7.  He is currently on Avodart.  He is clinically doing well specifically denies any increased lower urinary tract symptoms diarrhea or fatigue.Marland Kitchen  Unfortunately his PSA has jumped from 0.076 months ago to 4.3 now.  He is having no bone pain.  Bone scan back in January 22 showed no evidence of metastatic disease.  COMPLICATIONS OF TREATMENT: none  FOLLOW UP COMPLIANCE: keeps appointments   PHYSICAL EXAM:  BP (!) (P) 148/70 (BP Location: Left Arm, Patient Position: Sitting)    Pulse (P) 79    Temp (!) (P) 97.4 F (36.3 C) (Tympanic)    Resp (P) 18    Wt (P) 248 lb 9.6 oz (112.8 kg)    BMI (P) 31.58 kg/m  Well-developed well-nourished patient in NAD. HEENT reveals PERLA, EOMI, discs not visualized.  Oral cavity is clear. No oral mucosal lesions are identified. Neck is clear without evidence of cervical or supraclavicular adenopathy. Lungs are clear to A&P. Cardiac examination is essentially unremarkable with regular rate and rhythm without murmur rub or thrill. Abdomen is benign with no organomegaly or masses noted. Motor sensory and DTR levels are equal and symmetric in the upper and lower extremities. Cranial nerves II through XII are grossly intact. Proprioception is intact. No peripheral adenopathy or edema is identified. No motor or sensory levels are noted. Crude visual fields are within normal range.  RADIOLOGY RESULTS: Prior bone scan and MRI of prostate reviewed  PLAN: At this time I am referring him to  medical oncology.  With such a significant rise in his PSA he would benefit from evaluation by medical oncology for continuation of ADT therapy.  Appointment with medical oncology has been arranged.  I have asked to see him back in 6 months for follow-up.  I would like to take this opportunity to thank you for allowing me to participate in the care of your patient.Noreene Filbert, MD

## 2021-07-13 ENCOUNTER — Encounter: Payer: Self-pay | Admitting: Oncology

## 2021-07-13 ENCOUNTER — Inpatient Hospital Stay: Payer: Medicare HMO

## 2021-07-13 ENCOUNTER — Other Ambulatory Visit: Payer: Self-pay

## 2021-07-13 ENCOUNTER — Inpatient Hospital Stay: Payer: Medicare HMO | Attending: Radiation Oncology | Admitting: Oncology

## 2021-07-13 VITALS — BP 168/76 | HR 84 | Temp 96.5°F | Wt 249.0 lb

## 2021-07-13 DIAGNOSIS — C61 Malignant neoplasm of prostate: Secondary | ICD-10-CM | POA: Insufficient documentation

## 2021-07-13 DIAGNOSIS — Z7189 Other specified counseling: Secondary | ICD-10-CM | POA: Insufficient documentation

## 2021-07-13 NOTE — Progress Notes (Signed)
Hematology/Oncology Consult note [phone] [phone]         Patient Care Team: Cletis Athens, MD as PCP - General (Internal Medicine) Noreene Filbert, MD as Consulting Physician (Radiation Oncology) Earlie Server, MD as Consulting Physician (Oncology) Anthonette Legato, MD as Consulting Physician (Nephrology) Royston Cowper, MD as Consulting Physician (Urology)  REFERRING PROVIDER: Noreene Filbert, MD  CHIEF COMPLAINTS/REASON FOR VISIT:  Evaluation of prostate cancer  HISTORY OF PRESENTING ILLNESS:   Keith Howe is a  81 y.o.  male with PMH listed below was seen in consultation at the request of  Noreene Filbert, MD  for evaluation of prostate cancer  03/17/2020, MRI of the prostate showed PI-RADS category 5 lesion of the left posterolateral and posteromedial peripheral zone in the base, mid gland, and apex, with involvement of the left central zone at the base. This lesion bulges the prosthetic capsular margin adjacent to the rectum, and there is a high suspicion for extraprostatic spread and early seminal vesicle invasion and likely left neurovascular bundle involvement. PI-RADS category 2 lesion in the right peripheral zone,considered PI-RADS category 2 given the nonfocal nature.. Encapsulated nodularity in the transition zone compatible with benign prostatic hypertrophy. Prostate volume 71.50 cubic cm. Patient follows up with urology Dr. Yves Dill he is prostate cancer history dated back to 04/08/20.  Prostate biopsy showed prostate adenocarcinoma, 12 out of 12 cores involved.  Gleason score 10 [5+5] 2 cores, Gleason 9 [5+4] 1 core, Gleason 9 [4+5] involving 8 cores.  Per note, his PSA was 5.7.   05/13/2020, CT abdomen pelvis without contrast showed moderately enlarged prostate, no evidence of abdominal or pelvic metastatic disease. 05/13/2020, bone scan whole body showed no evidence of osseous metastasis. 06/04/2020 patient was seen by Dr. Baruch Gouty and underwent IMRT  to prostate and pelvic nodes.  Dr. Baruch Gouty asked Dr. Eliberto Ivory to give adjuvant deprivation therapy.  It was not clear whether patient received ADT or not. Patient denies that he has been on any hormone injections. Patient continues to follow-up with Dr. Baruch Gouty.  [place] of PSA was 0.07-8/24/2022.  07/01/2021, PSA increased to 4.3. Patient was referred by Dr. Baruch Gouty to reestablish care with oncology for evaluation.  He was previously seen by me in the hematology clinic for MGUS and lost follow-up.  Oncology was not involved previously for treatment of prostate cancer.  Today patient reports feeling well.  Dr. Eliberto Ivory has suggested patient to started on Orgovyx since mid February 2023.  Also Dr. Eliberto Ivory has started patient on Xtandi 160 mg daily starting yesterday.     Review of Systems  Constitutional:  Negative for appetite change, chills, fatigue, fever and unexpected weight change.  HENT:   Negative for hearing loss and voice change.   Eyes:  Negative for eye problems and icterus.  Respiratory:  Negative for chest tightness, cough and shortness of breath.   Cardiovascular:  Negative for chest pain and leg swelling.  Gastrointestinal:  Negative for abdominal distention and abdominal pain.  Endocrine: Negative for hot flashes.  Genitourinary:  Negative for difficulty urinating, dysuria and frequency.   Musculoskeletal:  Negative for arthralgias.  Skin:  Negative for itching and rash.  Neurological:  Negative for light-headedness and numbness.  Hematological:  Negative for adenopathy. Does not bruise/bleed easily.  Psychiatric/Behavioral:  Negative for confusion.    MEDICAL HISTORY:  Past Medical History:  Diagnosis Date   BPH (benign prostatic hyperplasia)    Cancer ([place])    Chronic kidney disease    STAGE 3  Diabetes mellitus without complication (HCC)    HOH (hard of hearing)    AIDS   Hypertension    Inguinal hernia    Pain    CHRONIC LBP   Prostate cancer [facility])     SURGICAL  HISTORY: Past Surgical History:  Procedure Laterality Date   BACK SURGERY     Lumbar   CATARACT EXTRACTION W/PHACO Right 07/10/2017   Procedure: CATARACT EXTRACTION PHACO AND INTRAOCULAR LENS PLACEMENT (IOC);  Surgeon: Birder Robson, MD;  Location: [facility] ORS;  Service: Ophthalmology;  Laterality: Right;  [place] 00:29.2 AP% 16.5 CDE 4.83 Fluid Pack Lot # [id number]   COLONOSCOPY     EYE SURGERY Bilateral    cataract extraction   HERNIA REPAIR     INGUINAL   PROSTATE BIOPSY N/A 04/08/2020   Procedure: PROSTATE BIOPSY [name];  Surgeon: Royston Cowper, MD;  Location: [facility] ORS;  Service: Urology;  Laterality: N/A;   TOTAL KNEE ARTHROPLASTY Left 03/19/2018   Procedure: TOTAL KNEE ARTHROPLASTY;  Surgeon: Thornton Park, MD;  Location: [facility] ORS;  Service: Orthopedics;  Laterality: Left;    SOCIAL HISTORY: Social History   Socioeconomic History   Marital status: Married    Spouse name: Not on file   Number of children: Not on file   Years of education: Not on file   Highest education level: Some college, no degree  Occupational History   Not on file  Tobacco Use   Smoking status: Never   Smokeless tobacco: Never  Vaping Use   Vaping Use: Never used  Substance and Sexual Activity   Alcohol use: No   Drug use: Never   Sexual activity: Not Currently  Other Topics Concern   Not on file  Social History Narrative   Independent at baseline.  Lives at home with his wife   Social Determinants of Radio broadcast assistant Strain: Low Risk    Difficulty of Paying Living Expenses: Not hard at all  Food Insecurity: No Food Insecurity   Worried About Charity fundraiser in the Last Year: Never true   Arboriculturist in the Last Year: Never true  Transportation Needs: No Transportation Needs   Lack of Transportation (Medical): No   Lack of Transportation (Non-Medical): No  Physical Activity: Insufficiently Active   Days of Exercise per Week: 7 days   Minutes of Exercise per Session:  10 min  Stress: No Stress Concern Present   Feeling of Stress : Not at all  Social Connections: Socially Integrated   Frequency of Communication with Friends and Family: More than three times a week   Frequency of Social Gatherings with Friends and Family: Once a week   Attends Religious Services: More than 4 times per year   Active Member of [employer] or Organizations: Yes   Attends Music therapist: More than 4 times per year   Marital Status: Married  Human resources officer Violence: Not At Risk   Fear of Current or Ex-Partner: No   Emotionally Abused: No   Physically Abused: No   Sexually Abused: No    FAMILY HISTORY: Family History  Problem Relation Age of Onset   Diabetes Father    Breast cancer Sister     ALLERGIES:  has No Known Allergies.  MEDICATIONS:  Current Outpatient Medications  Medication Sig Dispense Refill   amLODipine (NORVASC) 5 MG tablet Take 5 mg by mouth daily.      aspirin EC 81 MG tablet Take 81 mg by mouth  daily.     cholecalciferol (VITAMIN D) 1000 units tablet Take 1,000 Units by mouth daily.     dutasteride (AVODART) 0.5 MG capsule Take 0.5 mg by mouth daily.  3   enzalutamide (XTANDI) 40 MG capsule Take 160 mg by mouth daily.     losartan (COZAAR) 100 MG tablet Take 100 mg by mouth daily.     lovastatin (MEVACOR) 40 MG tablet TAKE 1 TABLET BY MOUTH EVERY DAY 90 tablet 3   metoprolol tartrate (LOPRESSOR) 50 MG tablet TAKE 1 TABLET BY MOUTH EVERY 12 HOURS 180 tablet 2   Misc Natural Products (PROSTATE SUPPORT PO) Take 2 capsules by mouth daily.     NOVOLOG FLEXPEN 100 UNIT/ML FlexPen Inject 7 Units into the skin 3 (three) times daily before meals.  0   Omega-3 Fatty Acids (FISH OIL PO) Take 1 capsule by mouth daily.      Relugolix (ORGOVYX) 120 MG TABS Take 1 tablet by mouth daily.     tamsulosin (FLOMAX) 0.4 MG CAPS capsule Take 0.4 mg by mouth daily after breakfast.      TRESIBA FLEXTOUCH 200 UNIT/ML SOPN Inject 80 Units into the muscle daily  before breakfast.   2   TRULICITY 1.5 ZO/1.0RU SOPN Inject 1.5 mg into the muscle every Wednesday.   1   vitamin B-12 (CYANOCOBALAMIN) 500 MCG tablet Take 500 mcg by mouth daily.     No current facility-administered medications for this visit.     PHYSICAL EXAMINATION: ECOG PERFORMANCE STATUS: 1 - Symptomatic but completely ambulatory Vitals:   07/13/21 1122  BP: (!) 168/76  Pulse: 84  Temp: (!) 96.5 F (35.8 C)   Filed Weights   07/13/21 1122  Weight: 249 lb (112.9 kg)    Physical Exam Constitutional:      General: He is not in acute distress. HENT:     Head: Normocephalic and atraumatic.  Eyes:     General: No scleral icterus. Cardiovascular:     Rate and Rhythm: Normal rate and regular rhythm.     Heart sounds: Normal heart sounds.  Pulmonary:     Effort: Pulmonary effort is normal. No respiratory distress.     Breath sounds: No wheezing.  Abdominal:     General: Bowel sounds are normal. There is no distension.     Palpations: Abdomen is soft.  Musculoskeletal:        General: No deformity. Normal range of motion.     Cervical back: Normal range of motion and neck supple.  Skin:    General: Skin is warm and dry.     Findings: No erythema or rash.  Neurological:     Mental Status: He is alert and oriented to person, place, and time. Mental status is at baseline.     Cranial Nerves: No cranial nerve deficit.     Coordination: Coordination normal.  Psychiatric:        Mood and Affect: Mood normal.    LABORATORY DATA:  I have reviewed the data as listed Lab Results  Component Value Date   WBC 3.8 (L) 08/11/2020   HGB 11.6 (L) 08/11/2020   HCT 35.2 (L) 08/11/2020   MCV 84.2 08/11/2020   PLT 137 (L) 08/11/2020   No results for input(s): NA, K, CL, CO2, GLUCOSE, BUN, CREATININE, CALCIUM, GFRNONAA, GFRAA, PROT, ALBUMIN, AST, ALT, ALKPHOS, BILITOT, BILIDIR, IBILI in the last 8760 hours. Iron/TIBC/Ferritin/ %Sat    Component Value Date/Time   IRON 10 (L)  03/22/2018 0454  TIBC 231 (L) 03/22/2018 0420   FERRITIN 120 03/22/2018 0420   IRONPCTSAT 4 (L) 03/22/2018 0420      RADIOGRAPHIC STUDIES: I have personally reviewed the radiological images as listed and agreed with the findings in the report. No results found.    ASSESSMENT & PLAN:  1. Prostate cancer ([place])   2. Goals of care, counseling/discussion    #Prostate cancer, Gleason score 10. Patient has had PSA recurrence.  I recommend patient to have PSMA scan. he has already been started on androgen deprivation therapy and started on Xtandi yesterday. Discussed with patient that I recommend him to have further imaging study to determine if he has biochemical recurrence or radiographic metastatic disease, treatments are different. If he only has biochemical recurrence, then adjuvant deprivation therapy is sufficient at this point. If he has metastatic disease, ADT plus Xtandi or other oral agents or reasonable. Check CBC, CMP, PSA. Patient declined image study and blood work. He would like me to send my recommendation to his urologist as " Dr.Wolff is in charge".  He does not have follow up appoint scheduled.  This note will be sent to [facility] office.  All questions were answered. The patient knows to call the clinic with any problems questions or concerns.  cc Noreene Filbert, MD Thank you for this kind referral and the opportunity to participate in the care of this patient. A copy of today's note is routed to referring provider   Earlie Server, MD, PhD [facility] Hematology Oncology 07/13/2021

## 2021-07-14 ENCOUNTER — Telehealth: Payer: Self-pay

## 2021-07-14 NOTE — Telephone Encounter (Signed)
Copy of encounter note faxed to Dr. Donzetta Matters office. ? ?

## 2021-07-14 NOTE — Telephone Encounter (Signed)
-----   Message from Earlie Server, MD sent at 07/14/2021  8:15 AM EST ----- ?Please send a copy of my note to [facility]- Urology ? ?

## 2021-07-21 ENCOUNTER — Other Ambulatory Visit: Payer: Self-pay | Admitting: Internal Medicine

## 2021-08-29 ENCOUNTER — Ambulatory Visit (INDEPENDENT_AMBULATORY_CARE_PROVIDER_SITE_OTHER): Payer: Medicare HMO | Admitting: Internal Medicine

## 2021-08-29 ENCOUNTER — Encounter: Payer: Self-pay | Admitting: Internal Medicine

## 2021-08-29 VITALS — BP 140/80 | HR 80 | Ht 74.4 in | Wt 246.5 lb

## 2021-08-29 DIAGNOSIS — I10 Essential (primary) hypertension: Secondary | ICD-10-CM

## 2021-08-29 DIAGNOSIS — C61 Malignant neoplasm of prostate: Secondary | ICD-10-CM

## 2021-08-29 DIAGNOSIS — S76109A Unspecified injury of unspecified quadriceps muscle, fascia and tendon, initial encounter: Secondary | ICD-10-CM

## 2021-08-29 DIAGNOSIS — E669 Obesity, unspecified: Secondary | ICD-10-CM

## 2021-08-29 DIAGNOSIS — Z6831 Body mass index (BMI) 31.0-31.9, adult: Secondary | ICD-10-CM

## 2021-08-29 DIAGNOSIS — N1832 Chronic kidney disease, stage 3b: Secondary | ICD-10-CM | POA: Diagnosis not present

## 2021-08-29 DIAGNOSIS — Z794 Long term (current) use of insulin: Secondary | ICD-10-CM

## 2021-08-29 DIAGNOSIS — E119 Type 2 diabetes mellitus without complications: Secondary | ICD-10-CM

## 2021-08-29 LAB — GLUCOSE, POCT (MANUAL RESULT ENTRY): POC Glucose: 126 mg/dl — AB (ref 70–99)

## 2021-08-29 NOTE — Assessment & Plan Note (Signed)
Stable at the present time. 

## 2021-08-29 NOTE — Assessment & Plan Note (Signed)

## 2021-08-29 NOTE — Assessment & Plan Note (Signed)

## 2021-08-29 NOTE — Assessment & Plan Note (Signed)

## 2021-08-29 NOTE — Progress Notes (Signed)
? ?New Patient Office Visit ? ?Subjective:  ?Patient ID: Keith Howe, male    DOB: Mar 17, 1941  Age: 81 y.o. MRN: [id number] ? ?CC:  ?Chief Complaint  ?Patient presents with  ? Diabetes  ? ? ?Diabetes ? ?Patient presents for general checkup patient is known to have cancer of the prostate benign hypertension diabetes ?Past Medical History:  ?Diagnosis Date  ? BPH (benign prostatic hyperplasia)   ? Cancer [facility])   ? Chronic kidney disease   ? STAGE 3  ? Diabetes mellitus without complication ([place])   ? HOH (hard of hearing)   ? AIDS  ? Hypertension   ? Inguinal hernia   ? Pain   ? CHRONIC LBP  ? Prostate cancer ([place])   ? ? ? ?Current Outpatient Medications:  ?  amLODipine (NORVASC) 5 MG tablet, Take 5 mg by mouth daily. , Disp: , Rfl:  ?  aspirin EC 81 MG tablet, Take 81 mg by mouth daily., Disp: , Rfl:  ?  cholecalciferol (VITAMIN D) 1000 units tablet, Take 1,000 Units by mouth daily., Disp: , Rfl:  ?  dutasteride (AVODART) 0.5 MG capsule, Take 0.5 mg by mouth daily., Disp: , Rfl: 3 ?  enzalutamide (XTANDI) 40 MG capsule, Take 160 mg by mouth daily., Disp: , Rfl:  ?  losartan (COZAAR) 100 MG tablet, Take 100 mg by mouth daily., Disp: , Rfl:  ?  lovastatin (MEVACOR) 40 MG tablet, TAKE 1 TABLET BY MOUTH EVERY DAY, Disp: 90 tablet, Rfl: 3 ?  metoprolol tartrate (LOPRESSOR) 50 MG tablet, TAKE 1 TABLET BY MOUTH EVERY 12 HOURS, Disp: 180 tablet, Rfl: 2 ?  Misc Natural Products (PROSTATE SUPPORT PO), Take 2 capsules by mouth daily., Disp: , Rfl:  ?  NOVOLOG FLEXPEN 100 UNIT/ML FlexPen, Inject 7 Units into the skin 3 (three) times daily before meals., Disp: , Rfl: 0 ?  Omega-3 Fatty Acids (FISH OIL PO), Take 1 capsule by mouth daily. , Disp: , Rfl:  ?  Relugolix (ORGOVYX) 120 MG TABS, Take 1 tablet by mouth daily., Disp: , Rfl:  ?  tamsulosin (FLOMAX) 0.4 MG CAPS capsule, Take 0.4 mg by mouth daily after breakfast. , Disp: , Rfl:  ?  TRESIBA FLEXTOUCH 200 UNIT/ML SOPN, Inject 80 Units into the muscle daily before breakfast.  , Disp: , Rfl: 2 ?  TRULICITY 1.5 VF/6.4PP SOPN, Inject 1.5 mg into the muscle every Wednesday. , Disp: , Rfl: 1 ?  vitamin B-12 (CYANOCOBALAMIN) 500 MCG tablet, Take 500 mcg by mouth daily., Disp: , Rfl:   ? ?Past Surgical History:  ?Procedure Laterality Date  ? BACK SURGERY    ? Lumbar  ? CATARACT EXTRACTION W/PHACO Right 07/10/2017  ? Procedure: CATARACT EXTRACTION PHACO AND INTRAOCULAR LENS PLACEMENT (IOC);  Surgeon: Birder Robson, MD;  Location: [facility] ORS;  Service: Ophthalmology;  Laterality: Right;  [place] 00:29.2 ?AP% 16.5 ?CDE 4.83 ?Fluid Pack Lot # [id number]  ? COLONOSCOPY    ? EYE SURGERY Bilateral   ? cataract extraction  ? HERNIA REPAIR    ? INGUINAL  ? PROSTATE BIOPSY N/A 04/08/2020  ? Procedure: PROSTATE BIOPSY URONAV;  Surgeon: Royston Cowper, MD;  Location: [facility] ORS;  Service: Urology;  Laterality: N/A;  ? TOTAL KNEE ARTHROPLASTY Left 03/19/2018  ? Procedure: TOTAL KNEE ARTHROPLASTY;  Surgeon: Thornton Park, MD;  Location: [facility] ORS;  Service: Orthopedics;  Laterality: Left;  ? ? ?Family History  ?Problem Relation Age of Onset  ? Diabetes Father   ? Breast cancer  Sister   ? ? ?Social History  ? ?Socioeconomic History  ? Marital status: Married  ?  Spouse name: Not on file  ? Number of children: Not on file  ? Years of education: Not on file  ? Highest education level: Some college, no degree  ?Occupational History  ? Not on file  ?Tobacco Use  ? Smoking status: Never  ? Smokeless tobacco: Never  ?Vaping Use  ? Vaping Use: Never used  ?Substance and Sexual Activity  ? Alcohol use: No  ? Drug use: Never  ? Sexual activity: Not Currently  ?Other Topics Concern  ? Not on file  ?Social History Narrative  ? Independent at baseline.  Lives at home with his wife  ? ?Social Determinants of Health  ? ?Financial Resource Strain: Low Risk   ? Difficulty of Paying Living Expenses: Not hard at all  ?Food Insecurity: No Food Insecurity  ? Worried About Charity fundraiser in the Last Year: Never true  ? Ran Out of  Food in the Last Year: Never true  ?Transportation Needs: No Transportation Needs  ? Lack of Transportation (Medical): No  ? Lack of Transportation (Non-Medical): No  ?Physical Activity: Insufficiently Active  ? Days of Exercise per Week: 7 days  ? Minutes of Exercise per Session: 10 min  ?Stress: No Stress Concern Present  ? Feeling of Stress : Not at all  ?Social Connections: Socially Integrated  ? Frequency of Communication with Friends and Family: More than three times a week  ? Frequency of Social Gatherings with Friends and Family: Once a week  ? Attends Religious Services: More than 4 times per year  ? Active Member of Clubs or Organizations: Yes  ? Attends Archivist Meetings: More than 4 times per year  ? Marital Status: Married  ?Intimate Partner Violence: Not At Risk  ? Fear of Current or Ex-Partner: No  ? Emotionally Abused: No  ? Physically Abused: No  ? Sexually Abused: No  ? ? ?ROS ?Review of Systems  ?Constitutional: Negative.   ?HENT: Negative.    ?Eyes: Negative.   ?Respiratory: Negative.    ?Cardiovascular: Negative.   ?Gastrointestinal: Negative.   ?Endocrine: Negative.   ?Genitourinary: Negative.   ?Musculoskeletal: Negative.   ?Skin: Negative.   ?Allergic/Immunologic: Negative.   ?Neurological: Negative.   ?Hematological: Negative.   ?Psychiatric/Behavioral: Negative.    ?All other systems reviewed and are negative. ? ?Objective:  ? ?Today's Vitals: BP (!) 157/72   Pulse 80   Ht 6' 2.4" (1.89 m)   Wt 246 lb 8 oz (111.8 kg)   BMI 31.31 kg/m?  ? ?Physical Exam ?Vitals reviewed.  ?HENT:  ?   Head: Normocephalic.  ?Cardiovascular:  ?   Rate and Rhythm: Regular rhythm.  ?   Heart sounds:  ?  No friction rub.  ?Abdominal:  ?   Palpations: Abdomen is soft.  ?Musculoskeletal:     ?   General: Normal range of motion.  ?   Cervical back: Neck supple. No tenderness.  ?Skin: ?   General: Skin is warm.  ? ? ?Assessment & Plan:  ? ?Problem List Items Addressed This Visit   ? ?  ? Cardiovascular  and Mediastinum  ? Essential hypertension  ?   Patient denies any chest pain or shortness of breath there is no history of palpitation or paroxysmal nocturnal dyspnea ?  patient was advised to follow low-salt low-cholesterol diet ? ?  ideally I want to keep systolic  blood pressure below 130 mmHg, patient was asked to check blood pressure one times a week and give me a report on that.  Patient will be follow-up in 3 months  or earlier as needed, patient will call me back for any change in the cardiovascular symptoms ?Patient was advised to buy a book from local bookstore concerning blood pressure and read several chapters  every day.  This will be supplemented by some of the material we will give him from the office.  Patient should also utilize other resources like YouTube and Internet to learn more about the blood pressure and the diet. ? ?  ?  ?  ? Endocrine  ? Type 2 diabetes mellitus without complication, with long-term current use of insulin ([place]) - Primary  ?  - The patient's blood sugar is labile on med. ?- The patient will continue the current treatment regimen.  ?- I encouraged the patient to regularly check blood sugar.  ?- I encouraged the patient to monitor diet. I encouraged the patient to eat low-carb and low-sugar to help prevent blood sugar spikes.  ?- I encouraged the patient to continue following their prescribed treatment plan for diabetes ?- I informed the patient to get help if blood sugar drops below '54mg'$ /dL, or if suddenly have trouble thinking clearly or breathing.  ?Patient was advised to buy a book on diabetes from a local bookstore or from [place]  Patient should read 2 chapters every day to keep the motivation going, this is in addition to some of the materials we provided them from the office.  There are other resources on the Internet like YouTube and wilkipedia to get an education on the diabetes ? ?  ?  ? Relevant Orders  ? POCT glucose (manual entry) (Completed)  ?  ? Musculoskeletal  and Integument  ? Injury of quadriceps muscle  ?  Stable at the present time ? ?  ?  ?  ? Genitourinary  ? Stage 3b chronic kidney disease ([place])  ?  Stable at the present time ? ?  ?  ? Prostate cancer

## 2021-11-25 ENCOUNTER — Other Ambulatory Visit: Payer: Self-pay | Admitting: Internal Medicine

## 2021-11-25 DIAGNOSIS — E118 Type 2 diabetes mellitus with unspecified complications: Secondary | ICD-10-CM

## 2021-11-28 ENCOUNTER — Ambulatory Visit (INDEPENDENT_AMBULATORY_CARE_PROVIDER_SITE_OTHER): Payer: Medicare HMO | Admitting: Internal Medicine

## 2021-11-28 ENCOUNTER — Encounter: Payer: Self-pay | Admitting: Internal Medicine

## 2021-11-28 VITALS — BP 140/75 | HR 71 | Ht 74.4 in | Wt 239.1 lb

## 2021-11-28 DIAGNOSIS — E669 Obesity, unspecified: Secondary | ICD-10-CM | POA: Diagnosis not present

## 2021-11-28 DIAGNOSIS — Z23 Encounter for immunization: Secondary | ICD-10-CM | POA: Diagnosis not present

## 2021-11-28 DIAGNOSIS — Z794 Long term (current) use of insulin: Secondary | ICD-10-CM

## 2021-11-28 DIAGNOSIS — N1832 Chronic kidney disease, stage 3b: Secondary | ICD-10-CM | POA: Diagnosis not present

## 2021-11-28 DIAGNOSIS — C61 Malignant neoplasm of prostate: Secondary | ICD-10-CM

## 2021-11-28 DIAGNOSIS — E119 Type 2 diabetes mellitus without complications: Secondary | ICD-10-CM

## 2021-11-28 DIAGNOSIS — I10 Essential (primary) hypertension: Secondary | ICD-10-CM | POA: Diagnosis not present

## 2021-11-28 DIAGNOSIS — Z6831 Body mass index (BMI) 31.0-31.9, adult: Secondary | ICD-10-CM

## 2021-11-28 LAB — GLUCOSE, POCT (MANUAL RESULT ENTRY): POC Glucose: 96 mg/dl (ref 70–99)

## 2021-11-28 NOTE — Assessment & Plan Note (Signed)
Stable at the present time. 

## 2021-11-28 NOTE — Progress Notes (Signed)
Established Patient Office Visit  Subjective:  Patient ID: MARCKUS HANOVER, male    DOB: December 18, 1940  Age: 81 y.o. MRN: [id number]  CC:  Chief Complaint  Patient presents with   Diabetes    HPI  KAZUKI INGLE presents for general medical checkup patient denies any chest pain or shortness of breath.  He does not have any swelling of the legs shortness of breath on exertion.  He takes his medicine on a regular basis.  He does not smoke does not drink.  Past Medical History:  Diagnosis Date   BPH (benign prostatic hyperplasia)    Cancer (HCC)    Chronic kidney disease    STAGE 3   Diabetes mellitus without complication (HCC)    HOH (hard of hearing)    AIDS   Hypertension    Inguinal hernia    Pain    CHRONIC LBP   Prostate cancer [facility])     Past Surgical History:  Procedure Laterality Date   BACK SURGERY     Lumbar   CATARACT EXTRACTION W/PHACO Right 07/10/2017   Procedure: CATARACT EXTRACTION PHACO AND INTRAOCULAR LENS PLACEMENT ([place]);  Surgeon: Birder Robson, MD;  Location: [facility] ORS;  Service: Ophthalmology;  Laterality: Right;  [place] 00:29.2 AP% 16.5 CDE 4.83 Fluid Pack Lot # [id number]   COLONOSCOPY     EYE SURGERY Bilateral    cataract extraction   HERNIA REPAIR     INGUINAL   PROSTATE BIOPSY N/A 04/08/2020   Procedure: PROSTATE BIOPSY [name];  Surgeon: Royston Cowper, MD;  Location: [facility] ORS;  Service: Urology;  Laterality: N/A;   TOTAL KNEE ARTHROPLASTY Left 03/19/2018   Procedure: TOTAL KNEE ARTHROPLASTY;  Surgeon: Thornton Park, MD;  Location: [facility] ORS;  Service: Orthopedics;  Laterality: Left;    Family History  Problem Relation Age of Onset   Diabetes Father    Breast cancer Sister     Social History   Socioeconomic History   Marital status: Married    Spouse name: Not on file   Number of children: Not on file   Years of education: Not on file   Highest education level: Some college, no degree  Occupational History   Not on file   Tobacco Use   Smoking status: Never   Smokeless tobacco: Never  Vaping Use   Vaping Use: Never used  Substance and Sexual Activity   Alcohol use: No   Drug use: Never   Sexual activity: Not Currently  Other Topics Concern   Not on file  Social History Narrative   Independent at baseline.  Lives at home with his wife   Social Determinants of Health   Financial Resource Strain: Low Risk  (05/12/2021)   Overall Financial Resource Strain (CARDIA)    Difficulty of Paying Living Expenses: Not hard at all  Food Insecurity: No Food Insecurity (05/12/2021)   Hunger Vital Sign    Worried About Running Out of Food in the Last Year: Never true    Ran Out of Food in the Last Year: Never true  Transportation Needs: No Transportation Needs (05/12/2021)   PRAPARE - Hydrologist (Medical): No    Lack of Transportation (Non-Medical): No  Physical Activity: Insufficiently Active (05/12/2021)   Exercise Vital Sign    Days of Exercise per Week: 7 days    Minutes of Exercise per Session: 10 min  Stress: No Stress Concern Present (05/12/2021)   [place]  Stress Questionnaire    Feeling of Stress : Not at all  Social Connections: Socially Integrated (05/12/2021)   Social Connection and Isolation Panel [NHANES]    Frequency of Communication with Friends and Family: More than three times a week    Frequency of Social Gatherings with Friends and Family: Once a week    Attends Religious Services: More than 4 times per year    Active Member of [employer] or Organizations: Yes    Attends Music therapist: More than 4 times per year    Marital Status: Married  Human resources officer Violence: Not At Risk (05/12/2021)   Humiliation, Afraid, Rape, and Kick questionnaire    Fear of Current or Ex-Partner: No    Emotionally Abused: No    Physically Abused: No    Sexually Abused: No     Current Outpatient Medications:    amLODipine  (NORVASC) 5 MG tablet, Take 5 mg by mouth daily. , Disp: , Rfl:    aspirin EC 81 MG tablet, Take 81 mg by mouth daily., Disp: , Rfl:    cholecalciferol (VITAMIN D) 1000 units tablet, Take 1,000 Units by mouth daily., Disp: , Rfl:    dutasteride (AVODART) 0.5 MG capsule, Take 0.5 mg by mouth daily., Disp: , Rfl: 3   enzalutamide (XTANDI) 40 MG capsule, Take 160 mg by mouth daily., Disp: , Rfl:    losartan (COZAAR) 100 MG tablet, Take 100 mg by mouth daily., Disp: , Rfl:    lovastatin (MEVACOR) 40 MG tablet, TAKE 1 TABLET BY MOUTH EVERY DAY, Disp: 90 tablet, Rfl: 3   metoprolol tartrate (LOPRESSOR) 50 MG tablet, TAKE 1 TABLET BY MOUTH EVERY 12 HOURS, Disp: 180 tablet, Rfl: 2   Misc Natural Products (PROSTATE SUPPORT PO), Take 2 capsules by mouth daily., Disp: , Rfl:    NOVOLOG FLEXPEN 100 UNIT/ML FlexPen, Inject 7 Units into the skin 3 (three) times daily before meals., Disp: , Rfl: 0   Omega-3 Fatty Acids (FISH OIL PO), Take 1 capsule by mouth daily. , Disp: , Rfl:    Relugolix (ORGOVYX) 120 MG TABS, Take 1 tablet by mouth daily., Disp: , Rfl:    tamsulosin (FLOMAX) 0.4 MG CAPS capsule, Take 0.4 mg by mouth daily after breakfast. , Disp: , Rfl:    TRESIBA FLEXTOUCH 200 UNIT/ML SOPN, Inject 80 Units into the muscle daily before breakfast. , Disp: , Rfl: 2   TRULICITY 1.5 YQ/0.3KV SOPN, Inject 1.5 mg into the muscle every Wednesday. , Disp: , Rfl: 1   vitamin B-12 (CYANOCOBALAMIN) 500 MCG tablet, Take 500 mcg by mouth daily., Disp: , Rfl:    No Known Allergies  ROS Review of Systems  Constitutional: Negative.   HENT: Negative.    Eyes: Negative.   Respiratory: Negative.    Cardiovascular: Negative.   Gastrointestinal: Negative.   Endocrine: Negative.   Genitourinary: Negative.   Musculoskeletal: Negative.   Skin: Negative.   Allergic/Immunologic: Negative.   Neurological: Negative.   Hematological: Negative.   Psychiatric/Behavioral: Negative.    All other systems reviewed and are  negative.     Objective:    Physical Exam Vitals reviewed.  Constitutional:      Appearance: Normal appearance.  HENT:     Mouth/Throat:     Mouth: Mucous membranes are moist.  Eyes:     Pupils: Pupils are equal, round, and reactive to light.  Neck:     Vascular: No carotid bruit.  Cardiovascular:     Rate and Rhythm:  Normal rate and regular rhythm.     Pulses: Normal pulses.     Heart sounds: Normal heart sounds.  Pulmonary:     Effort: Pulmonary effort is normal.     Breath sounds: Normal breath sounds.  Abdominal:     General: Bowel sounds are normal.     Palpations: Abdomen is soft. There is no hepatomegaly, splenomegaly or mass.     Tenderness: There is no abdominal tenderness.     Hernia: No hernia is present.  Musculoskeletal:     Cervical back: Neck supple.     Right lower leg: No edema.     Left lower leg: No edema.  Skin:    Findings: No rash.  Neurological:     Mental Status: He is alert and oriented to person, place, and time.     Motor: No weakness.  Psychiatric:        Mood and Affect: Mood normal.        Behavior: Behavior normal.     BP 140/75   Pulse 71   Ht 6' 2.4" (1.89 m)   Wt 239 lb 1.6 oz (108.5 kg)   BMI 30.37 kg/m  Wt Readings from Last 3 Encounters:  11/28/21 239 lb 1.6 oz (108.5 kg)  08/29/21 246 lb 8 oz (111.8 kg)  07/13/21 249 lb (112.9 kg)     Health Maintenance Due  Topic Date Due   FOOT EXAM  Never done   TETANUS/TDAP  08/06/2005   Pneumonia Vaccine 24+ Years old (2 - PCV) 03/23/2019   COVID-19 Vaccine (6 - Booster for Pfizer series) 04/18/2021   HEMOGLOBIN A1C  08/24/2021    There are no preventive care reminders to display for this patient.  No results found for: "TSH" Lab Results  Component Value Date   WBC 3.8 (L) 08/11/2020   HGB 11.6 (L) 08/11/2020   HCT 35.2 (L) 08/11/2020   MCV 84.2 08/11/2020   PLT 137 (L) 08/11/2020   Lab Results  Component Value Date   NA 141 03/30/2020   K 3.9 03/30/2020   CO2  27 03/30/2020   GLUCOSE 135 (H) 03/30/2020   BUN 26 (H) 03/30/2020   CREATININE 1.70 (H) 05/13/2020   BILITOT 0.5 06/04/2018   ALKPHOS 57 06/04/2018   AST 26 06/04/2018   ALT 24 06/04/2018   PROT 7.6 06/04/2018   ALBUMIN 4.3 06/04/2018   CALCIUM 9.6 03/30/2020   ANIONGAP 10 03/30/2020   No results found for: "CHOL" No results found for: "HDL" No results found for: "LDLCALC" No results found for: "TRIG" No results found for: "CHOLHDL" Lab Results  Component Value Date   HGBA1C 5.6 02/23/2021      Assessment & Plan:   Problem List Items Addressed This Visit       Cardiovascular and Mediastinum   Essential hypertension - Primary     Patient denies any chest pain or shortness of breath there is no history of palpitation or paroxysmal nocturnal dyspnea   patient was advised to follow low-salt low-cholesterol diet    ideally I want to keep systolic blood pressure below 130 mmHg, patient was asked to check blood pressure one times a week and give me a report on that.  Patient will be follow-up in 3 months  or earlier as needed, patient will call me back for any change in the cardiovascular symptoms Patient was advised to buy a book from local bookstore concerning blood pressure and read several chapters  every day.  This  will be supplemented by some of the material we will give him from the office.  Patient should also utilize other resources like YouTube and Internet to learn more about the blood pressure and the diet.        Endocrine   Type 2 diabetes mellitus without complication, with long-term current use of insulin ([place])    - The patient's blood sugar is labile on med. - The patient will continue the current treatment regimen.  - I encouraged the patient to regularly check blood sugar.  - I encouraged the patient to monitor diet. I encouraged the patient to eat low-carb and low-sugar to help prevent blood sugar spikes.  - I encouraged the patient to continue following  their prescribed treatment plan for diabetes - I informed the patient to get help if blood sugar drops below '54mg'$ /dL, or if suddenly have trouble thinking clearly or breathing.  Patient was advised to buy a book on diabetes from a local bookstore or from [place]  Patient should read 2 chapters every day to keep the motivation going, this is in addition to some of the materials we provided them from the office.  There are other resources on the Internet like YouTube and wilkipedia to get an education on the diabetes      Relevant Orders   POCT glucose (manual entry) (Completed)     Genitourinary   Stage 3b chronic kidney disease ([place])    Refer to nephrologist      Prostate cancer ([place])    Stable at the present time        Other   Class 1 obesity with serious comorbidity and body mass index (BMI) of 31.0 to 31.9 in adult    - I encouraged the patient to lose weight.  - I educated them on making healthy dietary choices including eating more fruits and vegetables and less fried foods. - I encouraged the patient to exercise more, and educated on the benefits of exercise including weight loss, diabetes prevention, and hypertension prevention.   Dietary counseling with a registered dietician  Referral to a weight management support group (e.g. Weight Watchers, Overeaters Anonymous)  If your BMI is greater than 29 or you have gained more than 15 pounds you should work on weight loss.  Attend a healthy cooking class        No orders of the defined types were placed in this encounter.   Follow-up: No follow-ups on file.    Cletis Athens, MD

## 2021-11-28 NOTE — Assessment & Plan Note (Signed)

## 2021-11-28 NOTE — Assessment & Plan Note (Signed)
Refer to nephrologist 

## 2021-11-28 NOTE — Assessment & Plan Note (Signed)

## 2021-11-28 NOTE — Addendum Note (Signed)
Addended by: Alois Cliche on: 11/28/2021 02:54 PM   Modules accepted: Orders

## 2021-11-28 NOTE — Assessment & Plan Note (Signed)

## 2021-12-29 ENCOUNTER — Other Ambulatory Visit: Payer: Self-pay | Admitting: *Deleted

## 2021-12-29 ENCOUNTER — Inpatient Hospital Stay: Payer: Medicare HMO | Attending: Oncology

## 2021-12-29 DIAGNOSIS — C61 Malignant neoplasm of prostate: Secondary | ICD-10-CM | POA: Insufficient documentation

## 2021-12-29 LAB — PSA: Prostatic Specific Antigen: 0.38 ng/mL (ref 0.00–4.00)

## 2022-01-05 ENCOUNTER — Ambulatory Visit
Admission: RE | Admit: 2022-01-05 | Discharge: 2022-01-05 | Disposition: A | Discharge status: Home / Self Care | Payer: Medicare HMO | Source: Ambulatory Visit | Attending: Radiation Oncology | Admitting: Radiation Oncology

## 2022-01-05 VITALS — BP 159/77 | HR 66 | Resp 16 | Ht 74.0 in | Wt 237.2 lb

## 2022-01-05 DIAGNOSIS — C61 Malignant neoplasm of prostate: Secondary | ICD-10-CM | POA: Diagnosis present

## 2022-01-05 DIAGNOSIS — Z923 Personal history of irradiation: Secondary | ICD-10-CM | POA: Diagnosis not present

## 2022-01-05 NOTE — Progress Notes (Signed)
Radiation Oncology Follow up Note  Name: Keith Howe   Date:   01/05/2022 MRN:  [id number] DOB: 09-Jul-1940    This 81 y.o. male presents to the clinic today for 10-monthfollow-up status post IMRT radiation therapy for Gleason 10 (5+5) adenocarcinoma the prostate presenting with a PSA of 5.7.  REFERRING PROVIDER: MCletis Athens MD  HPI: Patient is a 81year old male now at 16 months having completed IMRT radiation therapy to his prostate and pelvic nodes for a Gleason 10 adenocarcinoma the prostate..Marland Kitchen He is seen today in routine follow-up is doing well no significant increase in lower urinary tract symptoms no diarrhea or fatigue.  He is currently on Avodart.  He is also on Xtandi as well as Flomax.  His most recent PSA is 00.17 COMPLICATIONS OF TREATMENT: none  FOLLOW UP COMPLIANCE: keeps appointments   PHYSICAL EXAM:  BP (!) 159/77   Pulse 66   Resp 16   Ht '6\' 2"'$  (1.88 m)   Wt 237 lb 3.2 oz (107.6 kg)   BMI 30.45 kg/m  Well-developed well-nourished patient in NAD. HEENT reveals PERLA, EOMI, discs not visualized.  Oral cavity is clear. No oral mucosal lesions are identified. Neck is clear without evidence of cervical or supraclavicular adenopathy. Lungs are clear to A&P. Cardiac examination is essentially unremarkable with regular rate and rhythm without murmur rub or thrill. Abdomen is benign with no organomegaly or masses noted. Motor sensory and DTR levels are equal and symmetric in the upper and lower extremities. Cranial nerves II through XII are grossly intact. Proprioception is intact. No peripheral adenopathy or edema is identified. No motor or sensory levels are noted. Crude visual fields are within normal range.  RADIOLOGY RESULTS: No current films for review  PLAN: Present time patient is doing well on excellent biochemical control of his prostate cancer.  Very low side effect profile.  Of asked to see him back in 6 months for follow-up.  Patient knows to call with any  concerns.  I would like to take this opportunity to thank you for allowing me to participate in the care of your patient..Noreene Filbert MD

## 2022-02-28 ENCOUNTER — Encounter: Payer: Self-pay | Admitting: Internal Medicine

## 2022-02-28 ENCOUNTER — Ambulatory Visit (INDEPENDENT_AMBULATORY_CARE_PROVIDER_SITE_OTHER): Payer: Medicare HMO | Admitting: Internal Medicine

## 2022-02-28 DIAGNOSIS — N1832 Chronic kidney disease, stage 3b: Secondary | ICD-10-CM

## 2022-02-28 DIAGNOSIS — E669 Obesity, unspecified: Secondary | ICD-10-CM

## 2022-02-28 DIAGNOSIS — I10 Essential (primary) hypertension: Secondary | ICD-10-CM | POA: Diagnosis not present

## 2022-02-28 DIAGNOSIS — E119 Type 2 diabetes mellitus without complications: Secondary | ICD-10-CM | POA: Diagnosis not present

## 2022-02-28 DIAGNOSIS — C61 Malignant neoplasm of prostate: Secondary | ICD-10-CM | POA: Diagnosis not present

## 2022-02-28 DIAGNOSIS — Z6831 Body mass index (BMI) 31.0-31.9, adult: Secondary | ICD-10-CM

## 2022-02-28 DIAGNOSIS — Z794 Long term (current) use of insulin: Secondary | ICD-10-CM

## 2022-02-28 NOTE — Assessment & Plan Note (Signed)

## 2022-02-28 NOTE — Assessment & Plan Note (Signed)
Stable at the present time. 

## 2022-02-28 NOTE — Assessment & Plan Note (Signed)
Stable

## 2022-02-28 NOTE — Assessment & Plan Note (Signed)
Blood pressure is elevated but he had not taken his medicine today and was advised to take his medicine regularly in the morning

## 2022-02-28 NOTE — Progress Notes (Signed)
Established Patient Office Visit  Subjective:  Patient ID: Keith Howe, male    DOB: [birth year]  Age: 81 y.o. MRN: [id number]  CC: No chief complaint on file.   HPI  Keith Howe presents for bp check  Past Medical History:  Diagnosis Date   BPH (benign prostatic hyperplasia)    Cancer ([place])    Chronic kidney disease    STAGE 3   Diabetes mellitus without complication (HCC)    HOH (hard of hearing)    AIDS   Hypertension    Inguinal hernia    Pain    CHRONIC LBP   Prostate cancer [facility])     Past Surgical History:  Procedure Laterality Date   BACK SURGERY     Lumbar   CATARACT EXTRACTION W/PHACO Right 07/10/2017   Procedure: CATARACT EXTRACTION PHACO AND INTRAOCULAR LENS PLACEMENT ([place]);  Surgeon: Birder Robson, MD;  Location: [facility] ORS;  Service: Ophthalmology;  Laterality: Right;  [place] 00:29.2 AP% 16.5 CDE 4.83 Fluid Pack Lot # [id number]   COLONOSCOPY     EYE SURGERY Bilateral    cataract extraction   HERNIA REPAIR     INGUINAL   PROSTATE BIOPSY N/A 04/08/2020   Procedure: PROSTATE BIOPSY [name];  Surgeon: Royston Cowper, MD;  Location: [facility] ORS;  Service: Urology;  Laterality: N/A;   TOTAL KNEE ARTHROPLASTY Left 03/19/2018   Procedure: TOTAL KNEE ARTHROPLASTY;  Surgeon: Thornton Park, MD;  Location: [facility] ORS;  Service: Orthopedics;  Laterality: Left;    Family History  Problem Relation Age of Onset   Diabetes Father    Breast cancer Sister     Social History   Socioeconomic History   Marital status: Married    Spouse name: Not on file   Number of children: Not on file   Years of education: Not on file   Highest education level: Some college, no degree  Occupational History   Not on file  Tobacco Use   Smoking status: Never   Smokeless tobacco: Never  Vaping Use   Vaping Use: Never used  Substance and Sexual Activity   Alcohol use: No   Drug use: Never   Sexual activity: Not Currently  Other Topics Concern   Not on file  Social  History Narrative   Independent at baseline.  Lives at home with his wife   Social Determinants of Health   Financial Resource Strain: Low Risk  (05/12/2021)   Overall Financial Resource Strain (CARDIA)    Difficulty of Paying Living Expenses: Not hard at all  Food Insecurity: No Food Insecurity (05/12/2021)   Hunger Vital Sign    Worried About Running Out of Food in the Last Year: Never true    Ran Out of Food in the Last Year: Never true  Transportation Needs: No Transportation Needs (05/12/2021)   PRAPARE - Hydrologist (Medical): No    Lack of Transportation (Non-Medical): No  Physical Activity: Insufficiently Active (05/12/2021)   Exercise Vital Sign    Days of Exercise per Week: 7 days    Minutes of Exercise per Session: 10 min  Stress: No Stress Concern Present (05/12/2021)   Keith Howe    Feeling of Stress : Not at all  Social Connections: [place] (05/12/2021)   Social Connection and Isolation Panel [NHANES]    Frequency of Communication with Friends and Family: More than three times a week    Frequency of Social  Gatherings with Friends and Family: Once a week    Attends Religious Services: More than 4 times per year    Active Member of [employer] or Organizations: Yes    Attends Music therapist: More than 4 times per year    Marital Status: Married  Human resources officer Violence: Not At Risk (05/12/2021)   Humiliation, Afraid, Rape, and Kick questionnaire    Fear of Current or Ex-Partner: No    Emotionally Abused: No    Physically Abused: No    Sexually Abused: No     Current Outpatient Medications:    amLODipine (NORVASC) 5 MG tablet, Take 5 mg by mouth daily. , Disp: , Rfl:    aspirin EC 81 MG tablet, Take 81 mg by mouth daily., Disp: , Rfl:    cholecalciferol (VITAMIN D) 1000 units tablet, Take 1,000 Units by mouth daily., Disp: , Rfl:    dutasteride (AVODART) 0.5  MG capsule, Take 0.5 mg by mouth daily., Disp: , Rfl: 3   enzalutamide (XTANDI) 40 MG capsule, Take 160 mg by mouth daily., Disp: , Rfl:    losartan (COZAAR) 100 MG tablet, Take 100 mg by mouth daily., Disp: , Rfl:    lovastatin (MEVACOR) 40 MG tablet, TAKE 1 TABLET BY MOUTH EVERY DAY, Disp: 90 tablet, Rfl: 3   metoprolol tartrate (LOPRESSOR) 50 MG tablet, TAKE 1 TABLET BY MOUTH EVERY 12 HOURS, Disp: 180 tablet, Rfl: 2   Misc Natural Products (PROSTATE SUPPORT PO), Take 2 capsules by mouth daily., Disp: , Rfl:    NOVOLOG FLEXPEN 100 UNIT/ML FlexPen, Inject 7 Units into the skin 3 (three) times daily before meals., Disp: , Rfl: 0   Omega-3 Fatty Acids (FISH OIL PO), Take 1 capsule by mouth daily. , Disp: , Rfl:    Relugolix (ORGOVYX) 120 MG TABS, Take 1 tablet by mouth daily., Disp: , Rfl:    tamsulosin (FLOMAX) 0.4 MG CAPS capsule, Take 0.4 mg by mouth daily after breakfast. , Disp: , Rfl:    TRESIBA FLEXTOUCH 200 UNIT/ML SOPN, Inject 80 Units into the muscle daily before breakfast. , Disp: , Rfl: 2   TRULICITY 1.5 BJ/6.2GB SOPN, Inject 1.5 mg into the muscle every Wednesday. , Disp: , Rfl: 1   vitamin B-12 (CYANOCOBALAMIN) 500 MCG tablet, Take 500 mcg by mouth daily., Disp: , Rfl:    No Known Allergies  ROS Review of Systems  Constitutional: Negative.   HENT: Negative.    Eyes: Negative.   Respiratory: Negative.    Cardiovascular: Negative.   Gastrointestinal: Negative.   Endocrine: Negative.   Genitourinary: Negative.   Musculoskeletal: Negative.   Skin: Negative.   Allergic/Immunologic: Negative.   Neurological: Negative.   Hematological: Negative.   Psychiatric/Behavioral: Negative.    All other systems reviewed and are negative.     Objective:    Physical Exam Vitals reviewed.  Constitutional:      Appearance: Normal appearance.  HENT:     Mouth/Throat:     Mouth: Mucous membranes are moist.  Eyes:     Pupils: Pupils are equal, round, and reactive to light.  Neck:      Vascular: No carotid bruit.  Cardiovascular:     Rate and Rhythm: Normal rate and regular rhythm.     Pulses: Normal pulses.     Heart sounds: Normal heart sounds.  Pulmonary:     Effort: Pulmonary effort is normal.     Breath sounds: Normal breath sounds.  Abdominal:     General:  Bowel sounds are normal.     Palpations: Abdomen is soft. There is no hepatomegaly, splenomegaly or mass.     Tenderness: There is no abdominal tenderness.     Hernia: No hernia is present.  Musculoskeletal:     Cervical back: Neck supple.     Right lower leg: No edema.     Left lower leg: No edema.  Skin:    Findings: No rash.  Neurological:     Mental Status: He is alert and oriented to person, place, and time.     Motor: No weakness.  Psychiatric:        Mood and Affect: Mood normal.        Behavior: Behavior normal.     There were no vitals taken for this visit. Wt Readings from Last 3 Encounters:  01/05/22 237 lb 3.2 oz (107.6 kg)  11/28/21 239 lb 1.6 oz (108.5 kg)  08/29/21 246 lb 8 oz (111.8 kg)     Health Maintenance Due  Topic Date Due   FOOT EXAM  Never done   Pneumonia Vaccine 43+ Years old (2 - PCV) 03/23/2019   Diabetic kidney evaluation - GFR measurement  03/30/2021   COVID-19 Vaccine (6 - Pfizer risk series) 04/18/2021   HEMOGLOBIN A1C  08/24/2021   INFLUENZA VACCINE  12/06/2021    There are no preventive care reminders to display for this patient.  No results found for: "TSH" Lab Results  Component Value Date   WBC 3.8 (L) 08/11/2020   HGB 11.6 (L) 08/11/2020   HCT 35.2 (L) 08/11/2020   MCV 84.2 08/11/2020   PLT 137 (L) 08/11/2020   Lab Results  Component Value Date   NA 141 03/30/2020   K 3.9 03/30/2020   CO2 27 03/30/2020   GLUCOSE 135 (H) 03/30/2020   BUN 26 (H) 03/30/2020   CREATININE 1.70 (H) 05/13/2020   BILITOT 0.5 06/04/2018   ALKPHOS 57 06/04/2018   AST 26 06/04/2018   ALT 24 06/04/2018   PROT 7.6 06/04/2018   ALBUMIN 4.3 06/04/2018    CALCIUM 9.6 03/30/2020   ANIONGAP 10 03/30/2020   No results found for: "CHOL" No results found for: "HDL" No results found for: "LDLCALC" No results found for: "TRIG" No results found for: "CHOLHDL" Lab Results  Component Value Date   HGBA1C 5.6 02/23/2021      Assessment & Plan:   Problem List Items Addressed This Visit       Cardiovascular and Mediastinum   Essential hypertension - Primary    Blood pressure is elevated but he had not taken his medicine today and was advised to take his medicine regularly in the morning        Endocrine   Type 2 diabetes mellitus without complication, with long-term current use of insulin (HCC)    Stable at the present time        Genitourinary   Stage 3b chronic kidney disease ([place])    Stable      Prostate cancer ([place])     Other   Class 1 obesity with serious comorbidity and body mass index (BMI) of 31.0 to 31.9 in adult    - I encouraged the patient to lose weight.  - I educated them on making healthy dietary choices including eating more fruits and vegetables and less fried foods. - I encouraged the patient to exercise more, and educated on the benefits of exercise including weight loss, diabetes prevention, and hypertension prevention.   Dietary counseling with  a registered dietician  Referral to a weight management support group (e.g. Weight Watchers, Overeaters Anonymous)  If your BMI is greater than 29 or you have gained more than 15 pounds you should work on weight loss.  Attend a healthy cooking class        No orders of the defined types were placed in this encounter.   Follow-up: No follow-ups on file.    Cletis Athens, MD

## 2022-04-22 ENCOUNTER — Other Ambulatory Visit: Payer: Self-pay | Admitting: Nurse Practitioner

## 2022-05-26 ENCOUNTER — Inpatient Hospital Stay: Payer: Medicare HMO

## 2022-05-26 ENCOUNTER — Inpatient Hospital Stay: Payer: Medicare HMO | Attending: Oncology | Admitting: Oncology

## 2022-05-26 ENCOUNTER — Encounter: Payer: Self-pay | Admitting: Oncology

## 2022-05-26 VITALS — BP 177/88 | HR 78 | Temp 96.0°F | Wt 241.3 lb

## 2022-05-26 DIAGNOSIS — Z5111 Encounter for antineoplastic chemotherapy: Secondary | ICD-10-CM

## 2022-05-26 DIAGNOSIS — Z79899 Other long term (current) drug therapy: Secondary | ICD-10-CM | POA: Diagnosis not present

## 2022-05-26 DIAGNOSIS — C61 Malignant neoplasm of prostate: Secondary | ICD-10-CM | POA: Insufficient documentation

## 2022-05-26 LAB — CBC WITH DIFFERENTIAL/PLATELET
Abs Immature Granulocytes: 0.02 10*3/uL (ref 0.00–0.07)
Basophils Absolute: 0 10*3/uL (ref 0.0–0.1)
Basophils Relative: 0 %
Eosinophils Absolute: 0.2 10*3/uL (ref 0.0–0.5)
Eosinophils Relative: 6 %
HCT: 40.8 % (ref 39.0–52.0)
Hemoglobin: 13.6 g/dL (ref 13.0–17.0)
Immature Granulocytes: 1 %
Lymphocytes Relative: 14 %
Lymphs Abs: 0.5 10*3/uL — ABNORMAL LOW (ref 0.7–4.0)
MCH: 28.5 pg (ref 26.0–34.0)
MCHC: 33.3 g/dL (ref 30.0–36.0)
MCV: 85.4 fL (ref 80.0–100.0)
Monocytes Absolute: 0.4 10*3/uL (ref 0.1–1.0)
Monocytes Relative: 11 %
Neutro Abs: 2.6 10*3/uL (ref 1.7–7.7)
Neutrophils Relative %: 68 %
Platelets: 174 10*3/uL (ref 150–400)
RBC: 4.78 MIL/uL (ref 4.22–5.81)
RDW: 12.8 % (ref 11.5–15.5)
WBC: 3.9 10*3/uL — ABNORMAL LOW (ref 4.0–10.5)
nRBC: 0 % (ref 0.0–0.2)

## 2022-05-26 LAB — COMPREHENSIVE METABOLIC PANEL
ALT: 17 U/L (ref 0–44)
AST: 22 U/L (ref 15–41)
Albumin: 4 g/dL (ref 3.5–5.0)
Alkaline Phosphatase: 71 U/L (ref 38–126)
Anion gap: 9 (ref 5–15)
BUN: 26 mg/dL — ABNORMAL HIGH (ref 8–23)
CO2: 27 mmol/L (ref 22–32)
Calcium: 8.7 mg/dL — ABNORMAL LOW (ref 8.9–10.3)
Chloride: 102 mmol/L (ref 98–111)
Creatinine, Ser: 1.5 mg/dL — ABNORMAL HIGH (ref 0.61–1.24)
GFR, Estimated: 47 mL/min — ABNORMAL LOW (ref >60)
Glucose, Bld: 161 mg/dL — ABNORMAL HIGH (ref 70–99)
Potassium: 4.5 mmol/L (ref 3.5–5.1)
Sodium: 138 mmol/L (ref 135–145)
Total Bilirubin: 0.3 mg/dL (ref 0.3–1.2)
Total Protein: 7.5 g/dL (ref 6.5–8.1)

## 2022-05-26 LAB — PSA: Prostatic Specific Antigen: 9.59 ng/mL — ABNORMAL HIGH (ref 0.00–4.00)

## 2022-05-26 NOTE — Assessment & Plan Note (Signed)
Treatment as listed above 

## 2022-05-26 NOTE — Assessment & Plan Note (Addendum)
Prostate cancer, Gleason score 10, s/p RT[2022]- biochemical recurrence 06/30/2021 Currently on ADT with Orgovyx 120 mg daily And Xtandi '160mg'$  daily.  Continue current regimen.  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

## 2022-05-26 NOTE — Progress Notes (Signed)
Hematology/Oncology Consult note [phone] [phone]   CHIEF COMPLAINTS/REASON FOR VISIT:  Evaluation of prostate cancer  ASSESSMENT & PLAN:   Prostate cancer ([place]) Prostate cancer, Gleason score 10, s/p RT[2022]- biochemical recurrence 06/30/2021 Currently on ADT with Orgovyx 120 mg daily And Xtandi '160mg'$  daily.  Continue current regimen.  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    Orders Placed This Encounter  Procedures   CBC with Differential/Platelet    Standing Status:   Future    Number of Occurrences:   1    Standing Expiration Date:   05/27/2023   Comprehensive metabolic panel    Standing Status:   Future    Number of Occurrences:   1    Standing Expiration Date:   05/26/2023   PSA    Standing Status:   Future    Number of Occurrences:   1    Standing Expiration Date:   05/27/2023   CBC with Differential/Platelet    Standing Status:   Future    Standing Expiration Date:   05/27/2023   Comprehensive metabolic panel    Standing Status:   Future    Standing Expiration Date:   05/26/2023   PSA    Standing Status:   Future    Standing Expiration Date:   05/27/2023   Follow up after PMSA All questions were answered. The patient knows to call the clinic with any problems, questions or concerns.  Earlie Server, MD, PhD [facility] Hematology Oncology 05/26/2022   HISTORY OF PRESENTING ILLNESS:   Keith Howe is a  82 y.o.  male presents for follow up for prostate cancer Oncology History  Prostate cancer ([place])  02/24/2020 Tumor Marker   PSA at Dr..Wolff's office 5.7.Marland Kitchen   03/17/2020 Imaging    MRI of the prostate showed PI-RADS category 5 lesion of the left posterolateral and posteromedial peripheral zone in the base, mid gland, and apex, with involvement of the left central zone  at the base. This lesion bulges the prosthetic capsular margin adjacent to the rectum, and there is a high suspicion for extraprostatic spread and early seminal vesicle invasion and likely left neurovascular bundle involvement. PI-RADS category 2 lesion in the right peripheral zone,considered PI-RADS category 2 given the nonfocal nature.. Encapsulated nodularity in the transition zone compatible with benign prostatic hypertrophy. Prostate volume 71.50 cubic cm.    04/08/2020 Initial Diagnosis   Prostate cancer ([place]) prostate cancer history dated back to 04/08/20.  Prostate biopsy showed prostate adenocarcinoma, 12 out of 12 cores involved.  Gleason score 10 [5+5] 2 cores, Gleason 9 [5+4] 1 core, Gleason 9 [4+5] involving 8 cores.     05/13/2020 Imaging   CT abdomen pelvis without contrast showed moderately enlarged prostate, no evidence of abdominal or pelvic metastatic disease.    05/13/2020 Imaging   bone scan whole body showed no evidence of osseous metastasis    06/04/2020 -  Radiation Therapy   patient was seen by Dr. Baruch Gouty and underwent IMRT to prostate and pelvic nodes.  Dr. Baruch Gouty asked Dr. Yves Dill to give adjuvant deprivation therapy.    12/29/2020 Tumor Marker   PSA 0.07   06/22/2021 -  Chemotherapy   Orgovyx '120mg'$  daily Xtandi '160mg'$  daily   07/01/2021 Tumor Marker   PSA 4.3 Patient was referred by Dr. Baruch Gouty to reestablish care with oncology for evaluation.  He  was previously seen by me in the hematology clinic for abnormal SPEP and lost follow-up.  Oncology was not involved previously for treatment of prostate cancer.    07/13/2021 Cancer Staging   Staging form: Prostate, AJCC 8th Edition - Clinical: Stage Unknown (rcTX, cNX) - Signed by Earlie Server, MD on 07/13/2021 Stage prefix: Recurrence   10/04/2021 Tumor Marker   PSA at Urologist office  <0.1   01/02/2022 Tumor Marker   PSA at urologist office 0.5     INTERVAL HISTORY Keith Howe is a 82 y.o. male who has above  history reviewed by me today presents for follow up visit for prostate cancer. Patient was last seen by me on 07/13/2021.  He declined further oncology workup as he prefers urology Dr. Yves Dill to manage his ADT and prostate cancer treatments. Patient present to reestablish care today as Dr. Yves Dill is retiring. Patient takes Orgovyx 120 mg daily and Xtandi 160 mg daily.  Tolerates well.  Denies any bone pain currently.   Review of Systems  Constitutional:  Negative for appetite change, chills, fatigue, fever and unexpected weight change.  HENT:   Negative for hearing loss and voice change.   Eyes:  Negative for eye problems and icterus.  Respiratory:  Negative for chest tightness, cough and shortness of breath.   Cardiovascular:  Negative for chest pain and leg swelling.  Gastrointestinal:  Negative for abdominal distention and abdominal pain.  Endocrine: Negative for hot flashes.  Genitourinary:  Negative for difficulty urinating, dysuria and frequency.   Musculoskeletal:  Negative for arthralgias.  Skin:  Negative for itching and rash.  Neurological:  Negative for light-headedness and numbness.  Hematological:  Negative for adenopathy. Does not bruise/bleed easily.  Psychiatric/Behavioral:  Negative for confusion.     MEDICAL HISTORY:  Past Medical History:  Diagnosis Date   BPH (benign prostatic hyperplasia)    Cancer (HCC)    Chronic kidney disease    STAGE 3   Diabetes mellitus without complication (HCC)    HOH (hard of hearing)    AIDS   Hypertension    Inguinal hernia    Pain    CHRONIC LBP   Prostate cancer [facility])     SURGICAL HISTORY: Past Surgical History:  Procedure Laterality Date   BACK SURGERY     Lumbar   CATARACT EXTRACTION W/PHACO Right 07/10/2017   Procedure: CATARACT EXTRACTION PHACO AND INTRAOCULAR LENS PLACEMENT ([place]);  Surgeon: Birder Robson, MD;  Location: [facility] ORS;  Service: Ophthalmology;  Laterality: Right;  [place] 00:29.2 AP% 16.5 CDE 4.83 Fluid Pack  Lot # [id number]   COLONOSCOPY     EYE SURGERY Bilateral    cataract extraction   HERNIA REPAIR     INGUINAL   PROSTATE BIOPSY N/A 04/08/2020   Procedure: PROSTATE BIOPSY [name];  Surgeon: Royston Cowper, MD;  Location: [facility] ORS;  Service: Urology;  Laterality: N/A;   TOTAL KNEE ARTHROPLASTY Left 03/19/2018   Procedure: TOTAL KNEE ARTHROPLASTY;  Surgeon: Thornton Park, MD;  Location: [facility] ORS;  Service: Orthopedics;  Laterality: Left;    SOCIAL HISTORY: Social History   Socioeconomic History   Marital status: Married    Spouse name: Not on file   Number of children: Not on file   Years of education: Not on file   Highest education level: Some college, no degree  Occupational History   Not on file  Tobacco Use   Smoking status: Never   Smokeless tobacco: Never  Vaping Use   Vaping Use:  Never used  Substance and Sexual Activity   Alcohol use: No   Drug use: Never   Sexual activity: Not Currently  Other Topics Concern   Not on file  Social History Narrative   Independent at baseline.  Lives at home with his wife   Social Determinants of Health   Financial Resource Strain: Low Risk  (05/12/2021)   Overall Financial Resource Strain (CARDIA)    Difficulty of Paying Living Expenses: Not hard at all  Food Insecurity: No Food Insecurity (05/12/2021)   Hunger Vital Sign    Worried About Running Out of Food in the Last Year: Never true    Ran Out of Food in the Last Year: Never true  Transportation Needs: No Transportation Needs (05/12/2021)   PRAPARE - Hydrologist (Medical): No    Lack of Transportation (Non-Medical): No  Physical Activity: Insufficiently Active (05/12/2021)   Exercise Vital Sign    Days of Exercise per Week: 7 days    Minutes of Exercise per Session: 10 min  Stress: No Stress Concern Present (05/12/2021)   [place]    Feeling of Stress : Not at all  Social  Connections: [place] (05/12/2021)   Social Connection and Isolation Panel [NHANES]    Frequency of Communication with Friends and Family: More than three times a week    Frequency of Social Gatherings with Friends and Family: Once a week    Attends Religious Services: More than 4 times per year    Active Member of [employer] or Organizations: Yes    Attends Music therapist: More than 4 times per year    Marital Status: Married  Human resources officer Violence: Not At Risk (05/12/2021)   Humiliation, Afraid, Rape, and Kick questionnaire    Fear of Current or Ex-Partner: No    Emotionally Abused: No    Physically Abused: No    Sexually Abused: No    FAMILY HISTORY: Family History  Problem Relation Age of Onset   Diabetes Father    Breast cancer Sister     ALLERGIES:  has No Known Allergies.  MEDICATIONS:  Current Outpatient Medications  Medication Sig Dispense Refill   amLODipine (NORVASC) 5 MG tablet Take 5 mg by mouth daily.      aspirin EC 81 MG tablet Take 81 mg by mouth daily.     cholecalciferol (VITAMIN D) 1000 units tablet Take 1,000 Units by mouth daily.     dapagliflozin propanediol (FARXIGA) 10 MG TABS tablet Take by mouth daily.     dutasteride (AVODART) 0.5 MG capsule Take 0.5 mg by mouth daily.  3   enzalutamide (XTANDI) 40 MG capsule Take 160 mg by mouth daily.     losartan (COZAAR) 100 MG tablet Take 100 mg by mouth daily.     lovastatin (MEVACOR) 40 MG tablet TAKE 1 TABLET BY MOUTH EVERY DAY 90 tablet 3   metoprolol tartrate (LOPRESSOR) 50 MG tablet TAKE 1 TABLET BY MOUTH EVERY 12 HOURS 180 tablet 2   Misc Natural Products (PROSTATE SUPPORT PO) Take 2 capsules by mouth daily.     NOVOLOG FLEXPEN 100 UNIT/ML FlexPen Inject 7 Units into the skin 3 (three) times daily before meals.  0   Omega-3 Fatty Acids (FISH OIL PO) Take 1 capsule by mouth daily.      Relugolix (ORGOVYX) 120 MG TABS Take 1 tablet by mouth daily.     TRESIBA FLEXTOUCH 200  UNIT/ML  SOPN Inject 80 Units into the muscle daily before breakfast.   2   TRULICITY 1.5 PV/3.7SM SOPN Inject 1.5 mg into the muscle every Wednesday.   1   vitamin B-12 (CYANOCOBALAMIN) 500 MCG tablet Take 500 mcg by mouth daily.     tamsulosin (FLOMAX) 0.4 MG CAPS capsule Take 0.4 mg by mouth daily after breakfast.  (Patient not taking: Reported on 05/26/2022)     No current facility-administered medications for this visit.     PHYSICAL EXAMINATION: ECOG PERFORMANCE STATUS: 1 - Symptomatic but completely ambulatory Vitals:   05/26/22 1134  BP: (!) 177/88  Pulse: 78  Temp: (!) 96 F (35.6 C)  SpO2: 100%   Filed Weights   05/26/22 1134  Weight: 241 lb 4.8 oz (109.5 kg)    Physical Exam Constitutional:      General: He is not in acute distress. HENT:     Head: Normocephalic and atraumatic.  Eyes:     General: No scleral icterus. Cardiovascular:     Rate and Rhythm: Normal rate and regular rhythm.     Heart sounds: Normal heart sounds.  Pulmonary:     Effort: Pulmonary effort is normal. No respiratory distress.     Breath sounds: No wheezing.  Abdominal:     General: Bowel sounds are normal. There is no distension.     Palpations: Abdomen is soft.  Musculoskeletal:        General: No deformity. Normal range of motion.     Cervical back: Normal range of motion and neck supple.  Skin:    General: Skin is warm and dry.     Findings: No erythema or rash.  Neurological:     Mental Status: He is alert and oriented to person, place, and time. Mental status is at baseline.     Cranial Nerves: No cranial nerve deficit.     Coordination: Coordination normal.  Psychiatric:        Mood and Affect: Mood normal.     LABORATORY DATA:  I have reviewed the data as listed    Latest Ref Rng & Units 05/26/2022   12:19 PM 08/11/2020    8:58 AM 07/28/2020    8:42 AM  CBC  WBC 4.0 - 10.5 K/uL 3.9  3.8  3.6   Hemoglobin 13.0 - 17.0 g/dL 13.6  11.6  11.9   Hematocrit 39.0 - 52.0 % 40.8  35.2   35.6   Platelets 150 - 400 K/uL 174  137  136       Latest Ref Rng & Units 05/26/2022   12:19 PM 05/13/2020    7:56 AM 03/30/2020    8:24 AM  CMP  Glucose 70 - 99 mg/dL 161   135   BUN 8 - 23 mg/dL 26   26   Creatinine 0.61 - 1.24 mg/dL 1.50  1.70  1.74   Sodium 135 - 145 mmol/L 138   141   Potassium 3.5 - 5.1 mmol/L 4.5   3.9   Chloride 98 - 111 mmol/L 102   104   CO2 22 - 32 mmol/L 27   27   Calcium 8.9 - 10.3 mg/dL 8.7   9.6   Total Protein 6.5 - 8.1 g/dL 7.5     Total Bilirubin 0.3 - 1.2 mg/dL 0.3     Alkaline Phos 38 - 126 U/L 71     AST 15 - 41 U/L 22     ALT 0 - 44 U/L 17  RADIOGRAPHIC STUDIES: I have personally reviewed the radiological images as listed and agreed with the findings in the report. No results found.    ASSESSMENT & PLAN:  1. Prostate cancer ([place])    #Prostate cancer, Gleason score 10. Patient has had PSA recurrence.  I recommend patient to have PSMA scan. he has already been started on androgen deprivation therapy and started on Xtandi yesterday. Discussed with patient that I recommend him to have further imaging study to determine if he has biochemical recurrence or radiographic metastatic disease, treatments are different. If he only has biochemical recurrence, then adjuvant deprivation therapy is sufficient at this point. If he has metastatic disease, ADT plus Xtandi or other oral agents or reasonable. Check CBC, CMP, PSA. Patient declined image study and blood work. He would like me to send my recommendation to his urologist as " Dr.Wolff is in charge".  He does not have follow up appoint scheduled.  This note will be sent to [facility] office.  All questions were answered. The patient knows to call the clinic with any problems questions or concerns.  cc Cletis Athens, MD Thank you for this kind referral and the opportunity to participate in the care of this patient. A copy of today's note is routed to referring provider   Earlie Server, MD,  PhD [facility] Hematology Oncology 05/26/2022

## 2022-06-01 ENCOUNTER — Telehealth: Payer: Self-pay

## 2022-06-01 ENCOUNTER — Telehealth: Payer: Self-pay | Admitting: *Deleted

## 2022-06-01 DIAGNOSIS — C61 Malignant neoplasm of prostate: Secondary | ICD-10-CM

## 2022-06-01 NOTE — Telephone Encounter (Signed)
Wife called stating he was told that the Gillermina Phy is not working and he needs a PET scan. She is asking iof he needs to stop the [place] or continue taking it. Please return her call

## 2022-06-01 NOTE — Telephone Encounter (Signed)
-----  Message from Earlie Server, MD sent at 06/01/2022  9:51 AM EST ----- PSA is elevated.  I recommend PSMA PET please arrange. Please add MD appt  a few days after PET  I called pt and his wife and they are aware about the plan.  thanks zy

## 2022-06-02 NOTE — Telephone Encounter (Signed)
Call returned. No answer. Detailed message informing him to continue Xtandi until next visit.

## 2022-06-14 ENCOUNTER — Ambulatory Visit
Admission: RE | Admit: 2022-06-14 | Discharge: 2022-06-14 | Disposition: A | Discharge status: Home / Self Care | Payer: Medicare HMO | Source: Ambulatory Visit | Attending: Oncology | Admitting: Oncology

## 2022-06-14 DIAGNOSIS — R9721 Rising PSA following treatment for malignant neoplasm of prostate: Secondary | ICD-10-CM | POA: Insufficient documentation

## 2022-06-14 DIAGNOSIS — N1370 Vesicoureteral-reflux, unspecified: Secondary | ICD-10-CM | POA: Diagnosis not present

## 2022-06-14 DIAGNOSIS — C61 Malignant neoplasm of prostate: Secondary | ICD-10-CM

## 2022-06-14 DIAGNOSIS — C7951 Secondary malignant neoplasm of bone: Secondary | ICD-10-CM | POA: Diagnosis not present

## 2022-06-14 DIAGNOSIS — N320 Bladder-neck obstruction: Secondary | ICD-10-CM | POA: Insufficient documentation

## 2022-06-14 DIAGNOSIS — I700 Atherosclerosis of aorta: Secondary | ICD-10-CM | POA: Diagnosis not present

## 2022-06-14 LAB — GLUCOSE, CAPILLARY: Glucose-Capillary: 69 mg/dL — ABNORMAL LOW (ref 70–99)

## 2022-06-14 MED ORDER — PIFLIFOLASTAT F 18 (PYLARIFY) INJECTION
9.0000 | Freq: Once | INTRAVENOUS | Status: AC
  Administered 2022-06-14: 9.32 via INTRAVENOUS

## 2022-06-16 ENCOUNTER — Inpatient Hospital Stay: Payer: Medicare HMO | Attending: Oncology | Admitting: Oncology

## 2022-06-16 ENCOUNTER — Inpatient Hospital Stay: Payer: Medicare HMO

## 2022-06-16 ENCOUNTER — Encounter: Payer: Self-pay | Admitting: Oncology

## 2022-06-16 VITALS — BP 143/76 | HR 68 | Temp 97.6°F | Resp 18 | Wt 242.7 lb

## 2022-06-16 DIAGNOSIS — Z5111 Encounter for antineoplastic chemotherapy: Secondary | ICD-10-CM | POA: Diagnosis present

## 2022-06-16 DIAGNOSIS — Z794 Long term (current) use of insulin: Secondary | ICD-10-CM

## 2022-06-16 DIAGNOSIS — C61 Malignant neoplasm of prostate: Secondary | ICD-10-CM

## 2022-06-16 DIAGNOSIS — D701 Agranulocytosis secondary to cancer chemotherapy: Secondary | ICD-10-CM | POA: Diagnosis not present

## 2022-06-16 DIAGNOSIS — N1832 Chronic kidney disease, stage 3b: Secondary | ICD-10-CM | POA: Insufficient documentation

## 2022-06-16 DIAGNOSIS — C7951 Secondary malignant neoplasm of bone: Secondary | ICD-10-CM | POA: Insufficient documentation

## 2022-06-16 DIAGNOSIS — E1122 Type 2 diabetes mellitus with diabetic chronic kidney disease: Secondary | ICD-10-CM | POA: Insufficient documentation

## 2022-06-16 DIAGNOSIS — Z7189 Other specified counseling: Secondary | ICD-10-CM | POA: Diagnosis not present

## 2022-06-16 DIAGNOSIS — Z5189 Encounter for other specified aftercare: Secondary | ICD-10-CM | POA: Insufficient documentation

## 2022-06-16 DIAGNOSIS — E119 Type 2 diabetes mellitus without complications: Secondary | ICD-10-CM

## 2022-06-16 LAB — PSA: Prostatic Specific Antigen: 21.45 ng/mL — ABNORMAL HIGH (ref 0.00–4.00)

## 2022-06-16 NOTE — Assessment & Plan Note (Signed)
On insulin

## 2022-06-16 NOTE — Progress Notes (Signed)
Pt here for follow up. No new concerns voiced.   

## 2022-06-16 NOTE — Assessment & Plan Note (Addendum)
Prostate cancer, Gleason score 10, s/p RT[2022]- biochemical recurrence 06/30/2021 Currently on ADT with Orgovyx 120 mg daily, Xtandi 183m daily.  PSA has increased to 9, repeat PSA further increased to 21.  PMSA showed disease progression with Several radiotracer avid bone metastases in the left scapula, thoracic spine, and bilateral ribs, no soft tissue metastatic lesion.   Discussed with patient about Docetaxel Q3weeks x 6 cycles. He has good PS.  I explained to the patient the risks and benefits of chemotherapy including all but not limited to hair loss, mouth sore, nausea, vomiting, diarrhea, low blood counts, bleeding, neuropathy, and risk of life threatening infection and even death, secondary malignancy etc.  . Patient is undecided and he agrees with getting chemotherapy class to get more information and decide.   Other options include radium 223, [id number] cabazitaxel etc Recommend checking Tissue NGS

## 2022-06-16 NOTE — Assessment & Plan Note (Signed)
Treatment as listed above 

## 2022-06-16 NOTE — Progress Notes (Signed)
START ON PATHWAY REGIMEN - Prostate ? ? ?  A cycle is every 21 days: ?    Prednisone  ?    Docetaxel  ? ?**Always confirm dose/schedule in your pharmacy ordering system** ? ?Patient Characteristics: ?Adenocarcinoma, Recurrent/New Systemic Disease (Including Biochemical Recurrence), Castration Resistant, M1, Prior Novel Hormonal Agent, No Molecular Alteration or Targeted Therapy Exhausted, No Prior Docetaxel ?Histology: Adenocarcinoma ?Therapeutic Status: Recurrent/New Systemic Disease (Including Biochemical Recurrence) ? ?Intent of Therapy: ?Non-Curative / Palliative Intent, Discussed with Patient ?

## 2022-06-16 NOTE — Assessment & Plan Note (Signed)
Discussed with patient. He understand that treatment is with palliative intent.

## 2022-06-16 NOTE — Assessment & Plan Note (Signed)
Encourage oral hydration and avoid nephrotoxins.   

## 2022-06-16 NOTE — Progress Notes (Signed)
Hematology/Oncology Consult note [phone] [phone]   CHIEF COMPLAINTS/REASON FOR VISIT:  Metastatic prostate cancer  ASSESSMENT & PLAN:   Cancer Staging  Prostate cancer [facility]) Staging form: Prostate, AJCC 8th Edition - Clinical: Stage Unknown (rcTX, cNX) - Signed by Earlie Server, MD on 07/13/2021 - Pathologic stage from 06/16/2022: Stage IVB (pNX, cM1b, Grade Group: 5) - Signed by Earlie Server, MD on 06/16/2022   Prostate cancer [facility]) Prostate cancer, Gleason score 10, s/p RT[2022]- biochemical recurrence 06/30/2021 Currently on ADT with Orgovyx 120 mg daily, Xtandi 195m daily.  PSA has increased to 9, repeat PSA further increased to 21.  PMSA showed disease progression with Several radiotracer avid bone metastases in the left scapula, thoracic spine, and bilateral ribs, no soft tissue metastatic lesion.   Discussed with patient about Docetaxel Q3weeks x 6 cycles. He has good PS.  I explained to the patient the risks and benefits of chemotherapy including all but not limited to hair loss, mouth sore, nausea, vomiting, diarrhea, low blood counts, bleeding, neuropathy, and risk of life threatening infection and even death, secondary malignancy etc.  . Patient is undecided and he agrees with getting chemotherapy class to get more information and decide.   Other options include radium 223, [id number] cabazitaxel etc Recommend checking Tissue NGS  Encounter for antineoplastic chemotherapy Treatment as listed above.   Goals of care, counseling/discussion Discussed with patient. He understand that treatment is with palliative intent.   Stage 3b chronic kidney disease ([place] Encourage oral hydration and avoid nephrotoxins.    Type 2 diabetes mellitus without complication, with long-term current use of insulin (HCC) On insulin   No orders of the defined types were placed in this encounter.  Follow up to be determined.  All questions were answered. The patient  knows to call the clinic with any problems, questions or concerns.  ZEarlie Server MD, PhD [facility] 06/16/2022   HISTORY OF PRESENTING ILLNESS:   Keith Howe  82y.o.  male presents for follow up for prostate cancer Oncology History  [place] ([place]  02/24/2020 Tumor Marker   PSA at Dr..Wolff's office 5.7..Marland Kitchen  03/17/2020 Imaging    MRI of the prostate showed PI-RADS category 5 lesion of the left posterolateral and posteromedial peripheral zone in the base, mid gland, and apex, with involvement of the left central zone at the base. This lesion bulges the prosthetic capsular margin adjacent to the rectum, and there is Howe high suspicion for extraprostatic spread and early seminal vesicle invasion and likely left neurovascular bundle involvement. PI-RADS category 2 lesion in the right peripheral zone,considered PI-RADS category 2 given the nonfocal nature.. Encapsulated nodularity in the transition zone compatible with benign prostatic hypertrophy. Prostate volume 71.50 cubic cm.    04/08/2020 Initial Diagnosis   [place] cancer history dated back to 04/08/20.  Prostate biopsy showed prostate adenocarcinoma, 12 out of 12 cores involved.  Gleason score 10 [5+5] 2 cores, Gleason 9 [5+4] 1 core, Gleason 9 [4+5] involving 8 cores.     05/13/2020 Imaging   CT abdomen pelvis without contrast showed moderately enlarged prostate, no evidence of abdominal or pelvic metastatic disease.    05/13/2020 Imaging   bone scan whole body showed no evidence of osseous metastasis    06/04/2020 -  Radiation Therapy   patient was seen by Dr. CBaruch Goutyand underwent IMRT to prostate and pelvic nodes.  Dr. CBaruch Goutyasked Dr. WYves Dillto give adjuvant deprivation therapy.  12/29/2020 Tumor Marker   PSA 0.07   06/22/2021 -  Chemotherapy   Orgovyx 131m daily Xtandi 168mdaily   07/01/2021 Tumor Marker   PSA 4.3 Patient was referred by Dr. ChBaruch Goutyo reestablish  care with oncology for evaluation.  He was previously seen by me in the hematology clinic for abnormal SPEP and lost follow-up.  Oncology was not involved previously for treatment of prostate cancer.    07/13/2021 Cancer Staging   Staging form: Prostate, AJCC 8th Edition - Clinical: Stage Unknown (rcTX, cNX) - Signed by YuEarlie ServerMD on 07/13/2021 Stage prefix: Recurrence   10/04/2021 Tumor Marker   PSA at Urologist office  <0.1   01/02/2022 Tumor Marker   PSA at urologist office 0.5   05/26/2022 Tumor Marker   PSA 9.59  Previous prostate cancer treatment was with Dr. WoYves Dill 05/26/2022  Patient presented to reestablish care as Dr. WoYves Dilletired   06/16/2022 Cancer Staging   Staging form: Prostate, AJCC 8th Edition - Pathologic stage from 06/16/2022: Stage IVB (pNX, cM1b, Grade Group: 5) - Signed by YuEarlie ServerMD on 06/16/2022 Histologic grading system: 5 grade system   06/16/2022 Tumor Marker   PSA  21.45   06/17/2022 -  Chemotherapy   Patient is on Treatment Plan : PROSTATE Docetaxel ([id number]      INBarviews Howe [age].o. male who has above history reviewed by me today presents for follow up visit for prostate cancer.  Patient takes Orgovyx 120 mg daily and Xtandi 160 mg daily.  Tolerates well.  Denies any bone pain currently. He presents to discuss lab results and PMSA results, management plan.    Review of Systems  Constitutional:  Negative for appetite change, chills, fatigue, fever and unexpected weight change.  HENT:   Negative for hearing loss and voice change.   Eyes:  Negative for eye problems and icterus.  Respiratory:  Negative for chest tightness, cough and shortness of breath.   Cardiovascular:  Negative for chest pain and leg swelling.  Gastrointestinal:  Negative for abdominal distention and abdominal pain.  Endocrine: Negative for hot flashes.  Genitourinary:  Negative for difficulty urinating, dysuria and frequency.   Musculoskeletal:  Negative for  arthralgias.  Skin:  Negative for itching and rash.  Neurological:  Negative for light-headedness and numbness.  Hematological:  Negative for adenopathy. Does not bruise/bleed easily.  Psychiatric/Behavioral:  Negative for confusion.     MEDICAL HISTORY:  Past Medical History:  Diagnosis Date   BPH (benign prostatic hyperplasia)    Cancer (HCC)    Chronic kidney disease    STAGE 3   Diabetes mellitus without complication (HCC)    HOH (hard of hearing)    AIDS   Hypertension    Inguinal hernia    Pain    CHRONIC LBP   [facility]    SURGICAL HISTORY: Past Surgical History:  Procedure Laterality Date   BACK SURGERY     Lumbar   CATARACT EXTRACTION W/PHACO Right 07/10/2017   Procedure: CATARACT EXTRACTION PHACO AND INTRAOCULAR LENS PLACEMENT [place]  Surgeon: PoBirder RobsonMD;  Location: [facility] ORS;  Service: Ophthalmology;  Laterality: Right;  [place]:29.2 AP% 16.5 CDE 4.83 Fluid Pack Lot # [id number]   COLONOSCOPY     EYE SURGERY Bilateral    cataract extraction   HERNIA REPAIR     INGUINAL   PROSTATE BIOPSY N/Howe 04/08/2020   Procedure: PROSTATE BIOPSY U[place]ern[place] Surgeon: WoMaryan Puls  R, MD;  Location: [facility] ORS;  Service: Urology;  Laterality: N/Howe;   TOTAL KNEE ARTHROPLASTY Left 03/19/2018   Procedure: TOTAL KNEE ARTHROPLASTY;  Surgeon: Thornton Park, MD;  Location: [facility] ORS;  Service: Orthopedics;  Laterality: Left;    SOCIAL HISTORY: Social History   Socioeconomic History   Marital status: Married    Spouse name: Not on file   Number of children: Not on file   Years of education: Not on file   Highest education level: Some college, no degree  Occupational History   Not on file  Tobacco Use   Smoking status: Never   Smokeless tobacco: Never  Vaping Use   Vaping Use: Never used  Substance and Sexual Activity   Alcohol use: No   Drug use: Never   Sexual activity: Not Currently  Other Topics Concern   Not on file  Social History Narrative    Independent at baseline.  Lives at home with his wife   Social Determinants of Health   Financial Resource Strain: Low Risk  (05/12/2021)   Overall Financial Resource Strain (CARDIA)    Difficulty of Paying Living Expenses: Not hard at all  Food Insecurity: No Food Insecurity (05/12/2021)   Hunger Vital Sign    Worried About Running Out of Food in the Last Year: Never true    Ran Out of Food in the Last Year: Never true  Transportation Needs: No Transportation Needs (05/12/2021)   PRAPARE - Hydrologist (Medical): No    Lack of Transportation (Non-Medical): No  Physical Activity: Insufficiently Active (05/12/2021)   Exercise Vital Sign    Days of Exercise per Week: 7 days    Minutes of Exercise per Session: 10 min  Stress: No Stress Concern Present (05/12/2021)   [place]    Feeling of Stress : Not at all  Social Connections: [place] (05/12/2021)   Social Connection and Isolation Panel [NHANES]    Frequency of Communication with Friends and Family: More than three times Howe week    Frequency of Social Gatherings with Friends and Family: Once Howe week    Attends Religious Services: More than 4 times per year    Active Member of [employer] or Organizations: Yes    Attends Music therapist: More than 4 times per year    Marital Status: Married  Human resources officer Violence: Not At Risk (05/12/2021)   Humiliation, Afraid, Rape, and Kick questionnaire    Fear of Current or Ex-Partner: No    Emotionally Abused: No    Physically Abused: No    Sexually Abused: No    FAMILY HISTORY: Family History  Problem Relation Age of Onset   Diabetes Father    Breast cancer Sister     ALLERGIES:  has No Known Allergies.  MEDICATIONS:  Current Outpatient Medications  Medication Sig Dispense Refill   amLODipine (NORVASC) 5 MG tablet Take 5 mg by mouth daily.      aspirin EC 81 MG tablet Take 81  mg by mouth daily.     cholecalciferol (VITAMIN D) 1000 units tablet Take 1,000 Units by mouth daily.     dapagliflozin propanediol (FARXIGA) 10 MG TABS tablet Take by mouth daily.     dutasteride (AVODART) 0.5 MG capsule Take 0.5 mg by mouth daily.  3   enzalutamide (XTANDI) 40 MG capsule Take 160 mg by mouth daily.     losartan (COZAAR) 100 MG tablet  Take 100 mg by mouth daily.     lovastatin (MEVACOR) 40 MG tablet TAKE 1 TABLET BY MOUTH EVERY DAY 90 tablet 3   metoprolol tartrate (LOPRESSOR) 50 MG tablet TAKE 1 TABLET BY MOUTH EVERY 12 HOURS 180 tablet 2   Misc Natural Products (PROSTATE SUPPORT PO) Take 2 capsules by mouth daily.     NOVOLOG FLEXPEN 100 UNIT/ML FlexPen Inject 7 Units into the skin 3 (three) times daily before meals.  0   Omega-3 Fatty Acids (FISH OIL PO) Take 1 capsule by mouth daily.      Relugolix (ORGOVYX) 120 MG TABS Take 1 tablet by mouth daily.     TRESIBA FLEXTOUCH 200 UNIT/ML SOPN Inject 80 Units into the muscle daily before breakfast.   2   TRULICITY 1.5 0000000 SOPN Inject 1.5 mg into the muscle every Wednesday.   1   vitamin B-12 (CYANOCOBALAMIN) 500 MCG tablet Take 500 mcg by mouth daily.     No current facility-administered medications for this visit.     PHYSICAL EXAMINATION: ECOG PERFORMANCE STATUS: 1 - Symptomatic but completely ambulatory Vitals:   06/16/22 1015  BP: (!) 143/76  Pulse: 68  Resp: 18  Temp: 97.6 F (36.4 C)   Filed Weights   06/16/22 1015  Weight: 242 lb 11.2 oz (110.1 kg)    Physical Exam Constitutional:      General: He is not in acute distress. HENT:     Head: Normocephalic and atraumatic.  Eyes:     General: No scleral icterus. Cardiovascular:     Rate and Rhythm: Normal rate and regular rhythm.     Heart sounds: Normal heart sounds.  Pulmonary:     Effort: Pulmonary effort is normal. No respiratory distress.     Breath sounds: No wheezing.  Abdominal:     General: Bowel sounds are normal. There is no  distension.     Palpations: Abdomen is soft.  Musculoskeletal:        General: No deformity. Normal range of motion.     Cervical back: Normal range of motion and neck supple.  Skin:    General: Skin is warm and dry.     Findings: No erythema or rash.  Neurological:     Mental Status: He is alert and oriented to person, place, and time. Mental status is at baseline.     Cranial Nerves: No cranial nerve deficit.     Coordination: Coordination normal.  Psychiatric:        Mood and Affect: Mood normal.     LABORATORY DATA:  I have reviewed the data as listed    Latest Ref Rng & Units 05/26/2022   12:19 PM 08/11/2020    8:58 AM 07/28/2020    8:42 AM  CBC  WBC 4.0 - 10.5 K/uL 3.9  3.8  3.6   Hemoglobin 13.0 - 17.0 g/dL 13.6  11.6  11.9   Hematocrit 39.0 - 52.0 % 40.8  35.2  35.6   Platelets 150 - 400 K/uL 174  137  136       Latest Ref Rng & Units 05/26/2022   12:19 PM 05/13/2020    7:56 AM 03/30/2020    8:24 AM  CMP  Glucose 70 - 99 mg/dL 161   135   BUN 8 - 23 mg/dL 26   26   Creatinine 0.61 - 1.24 mg/dL 1.50  1.70  1.74   Sodium 135 - 145 mmol/L 138   141   Potassium 3.5 -  5.1 mmol/L 4.5   3.9   Chloride 98 - 111 mmol/L 102   104   CO2 22 - 32 mmol/L 27   27   Calcium 8.9 - 10.3 mg/dL 8.7   9.6   Total Protein 6.5 - 8.1 g/dL 7.5     Total Bilirubin 0.3 - 1.2 mg/dL 0.3     Alkaline Phos 38 - 126 U/L 71     AST 15 - 41 U/L 22     ALT 0 - 44 U/L 17       RADIOGRAPHIC STUDIES: I have personally reviewed the radiological images as listed and agreed with the findings in the report. NM PET (PSMA) SKULL TO MID THIGH  Result Date: 06/15/2022 CLINICAL DATA:  Prostate carcinoma with biochemical recurrence. EXAM: NUCLEAR MEDICINE PET SKULL BASE TO THIGH TECHNIQUE: 9.3 mCi F18 Piflufolastat (Pylarify) was injected intravenously. Full-ring PET imaging was performed from the skull base to thigh after the radiotracer. CT data was obtained and used for attenuation correction and anatomic  localization. COMPARISON:  CT on 05/13/2020 FINDINGS: NECK No radiotracer activity in neck lymph nodes. Incidental CT finding: None. CHEST No radiotracer accumulation within mediastinal or hilar lymph nodes. No suspicious pulmonary nodules on the CT scan. Incidental CT finding: Aortic and coronary atherosclerotic calcification incidentally noted. ABDOMEN/PELVIS Prostate: No focal activity in the prostate bed. Lymph nodes: No abnormal radiotracer accumulation within pelvic or abdominal nodes. Liver: No evidence of liver metastasis. Incidental CT finding: Stable moderately enlarged prostate gland, distended urinary bladder increased moderate bilateral hydroureteronephrosis to the level of the urinary bladder. These findings are consistent with chronic bladder outlet obstruction and vesicoureteral reflux. SKELETON Several bone metastases with intense radiotracer accumulation are seen in the left scapula, thoracic spine and several bilateral ribs. IMPRESSION: Several radiotracer avid bone metastases in the left scapula, thoracic spine, and bilateral ribs. No evidence of soft tissue metastatic disease. Stable enlarged prostate, with findings of chronic bladder outlet obstruction and vesicoureteral reflux. Aortic Atherosclerosis (ICD10-I70.0). Electronically Signed   By: Marlaine Hind M.D.   On: 06/15/2022 08:48

## 2022-06-17 ENCOUNTER — Other Ambulatory Visit: Payer: Self-pay

## 2022-06-20 ENCOUNTER — Other Ambulatory Visit: Payer: Self-pay | Admitting: Oncology

## 2022-06-20 ENCOUNTER — Inpatient Hospital Stay: Payer: Medicare HMO

## 2022-06-20 DIAGNOSIS — C61 Malignant neoplasm of prostate: Secondary | ICD-10-CM

## 2022-06-20 MED ORDER — DEXAMETHASONE 4 MG PO TABS: ORAL_TABLET | ORAL | 1 refills | Status: DC

## 2022-06-20 MED ORDER — ONDANSETRON HCL 8 MG PO TABS: 8.0000 mg | ORAL_TABLET | Freq: Three times a day (TID) | ORAL | 1 refills | Status: DC | PRN

## 2022-06-20 MED ORDER — PROCHLORPERAZINE MALEATE 10 MG PO TABS: 10.0000 mg | ORAL_TABLET | Freq: Four times a day (QID) | ORAL | 1 refills | Status: DC | PRN

## 2022-06-21 ENCOUNTER — Telehealth: Payer: Self-pay

## 2022-06-21 NOTE — Telephone Encounter (Signed)
Pt informed of appts and medications. He is agreeable to dates/ times.

## 2022-06-21 NOTE — Telephone Encounter (Signed)
Keith Howe, I will contact pt to inform him of appts once scheduled. Thanks

## 2022-06-21 NOTE — Telephone Encounter (Signed)
-----   Message from Earlie Server, MD sent at 06/20/2022 10:55 PM EST ----- Patient has agreed with proceed with chemotherapy.  I have updated his chemo IS plan, plan 2/21 lab MD Docetaxel with D3 GCSF Please advise him to pick up premed.  Dexamethasone needs to take on the day before chemo.   Thanks  zy

## 2022-06-23 ENCOUNTER — Other Ambulatory Visit: Payer: Self-pay | Admitting: *Deleted

## 2022-06-26 MED ORDER — ORGOVYX 120 MG PO TABS: 1.0000 | ORAL_TABLET | Freq: Every day | ORAL | 0 refills | Status: DC

## 2022-06-27 ENCOUNTER — Encounter: Payer: Self-pay | Admitting: Oncology

## 2022-06-27 ENCOUNTER — Inpatient Hospital Stay: Payer: Medicare HMO

## 2022-06-27 ENCOUNTER — Inpatient Hospital Stay (HOSPITAL_BASED_OUTPATIENT_CLINIC_OR_DEPARTMENT_OTHER): Payer: Medicare HMO | Admitting: Oncology

## 2022-06-27 VITALS — BP 146/68 | HR 79 | Temp 96.6°F | Resp 18 | Wt 241.8 lb

## 2022-06-27 DIAGNOSIS — C61 Malignant neoplasm of prostate: Secondary | ICD-10-CM | POA: Diagnosis not present

## 2022-06-27 DIAGNOSIS — Z5111 Encounter for antineoplastic chemotherapy: Secondary | ICD-10-CM

## 2022-06-27 DIAGNOSIS — E119 Type 2 diabetes mellitus without complications: Secondary | ICD-10-CM | POA: Diagnosis not present

## 2022-06-27 DIAGNOSIS — N1832 Chronic kidney disease, stage 3b: Secondary | ICD-10-CM | POA: Diagnosis not present

## 2022-06-27 DIAGNOSIS — Z794 Long term (current) use of insulin: Secondary | ICD-10-CM

## 2022-06-27 LAB — CBC WITH DIFFERENTIAL/PLATELET
Abs Immature Granulocytes: 0.02 10*3/uL (ref 0.00–0.07)
Basophils Absolute: 0 10*3/uL (ref 0.0–0.1)
Basophils Relative: 1 %
Eosinophils Absolute: 0.2 10*3/uL (ref 0.0–0.5)
Eosinophils Relative: 6 %
HCT: 39.5 % (ref 39.0–52.0)
Hemoglobin: 12.8 g/dL — ABNORMAL LOW (ref 13.0–17.0)
Immature Granulocytes: 1 %
Lymphocytes Relative: 11 %
Lymphs Abs: 0.5 10*3/uL — ABNORMAL LOW (ref 0.7–4.0)
MCH: 27.9 pg (ref 26.0–34.0)
MCHC: 32.4 g/dL (ref 30.0–36.0)
MCV: 86.2 fL (ref 80.0–100.0)
Monocytes Absolute: 0.5 10*3/uL (ref 0.1–1.0)
Monocytes Relative: 13 %
Neutro Abs: 2.8 10*3/uL (ref 1.7–7.7)
Neutrophils Relative %: 68 %
Platelets: 180 10*3/uL (ref 150–400)
RBC: 4.58 MIL/uL (ref 4.22–5.81)
RDW: 12.8 % (ref 11.5–15.5)
WBC: 4 10*3/uL (ref 4.0–10.5)
nRBC: 0 % (ref 0.0–0.2)

## 2022-06-27 LAB — COMPREHENSIVE METABOLIC PANEL
ALT: 12 U/L (ref 0–44)
AST: 18 U/L (ref 15–41)
Albumin: 4.1 g/dL (ref 3.5–5.0)
Alkaline Phosphatase: 56 U/L (ref 38–126)
Anion gap: 10 (ref 5–15)
BUN: 30 mg/dL — ABNORMAL HIGH (ref 8–23)
CO2: 27 mmol/L (ref 22–32)
Calcium: 9.6 mg/dL (ref 8.9–10.3)
Chloride: 104 mmol/L (ref 98–111)
Creatinine, Ser: 1.67 mg/dL — ABNORMAL HIGH (ref 0.61–1.24)
GFR, Estimated: 41 mL/min — ABNORMAL LOW (ref >60)
Glucose, Bld: 77 mg/dL (ref 70–99)
Potassium: 4 mmol/L (ref 3.5–5.1)
Sodium: 141 mmol/L (ref 135–145)
Total Bilirubin: 0.7 mg/dL (ref 0.3–1.2)
Total Protein: 7.4 g/dL (ref 6.5–8.1)

## 2022-06-27 LAB — PSA: Prostatic Specific Antigen: 32.25 ng/mL — ABNORMAL HIGH (ref 0.00–4.00)

## 2022-06-27 MED FILL — Dexamethasone Sodium Phosphate Inj 100 MG/10ML: INTRAMUSCULAR | Qty: 1 | Status: AC

## 2022-06-27 NOTE — Assessment & Plan Note (Signed)
Encourage oral hydration and avoid nephrotoxins.   

## 2022-06-27 NOTE — Assessment & Plan Note (Addendum)
Prostate cancer, Gleason score 10, s/p RT[2022]- biochemical recurrence 06/30/2021 Currently on ADT with Orgovyx 120 mg daily, Xtandi 150m daily.  PSA has increased to 9, repeat PSA further increased to 21.  PMSA showed disease progression with Several radiotracer avid bone metastases in the left scapula, thoracic spine, and bilateral ribs, no soft tissue metastatic lesion.  Recommend Docetaxel Q3weeks x 6 cycles with D3 GCSF Labs are reviewed and discussed with patient and wife.  We discussed about potential side effects and also reviewed Dexamethasone instruction and antiemetics instruction. He will proceed with first cycle of Docetaxel 78mm2 treatment tomorrow and get GSF on D3.  Recommend him to take Claritin 1030maily x 4 days starting D3 Stop Xtandi  Continue Orgovyx 120m1mily, check testosterone

## 2022-06-27 NOTE — Assessment & Plan Note (Signed)
On insulin, recommend patient to follow up with endocrinology/PCP

## 2022-06-27 NOTE — Progress Notes (Signed)
Hematology/Oncology Progress note [phone] Fax:([phone]) [phone]      CHIEF COMPLAINTS/REASON FOR VISIT:  Metastatic prostate cancer  ASSESSMENT & PLAN:   Cancer Staging  Prostate cancer [facility]) Staging form: Prostate, AJCC 8th Edition - Clinical: Stage Unknown (rcTX, cNX) - Signed by Earlie Server, MD on 07/13/2021 - Pathologic stage from 06/16/2022: Stage IVB (pNX, cM1b, Grade Group: 5) - Signed by Earlie Server, MD on 06/16/2022   Prostate cancer [facility]) Prostate cancer, Gleason score 10, s/p RT[2022]- biochemical recurrence 06/30/2021 Currently on ADT with Orgovyx 120 mg daily, Xtandi 18m daily.  PSA has increased to 9, repeat PSA further increased to 21.  PMSA showed disease progression with Several radiotracer avid bone metastases in the left scapula, thoracic spine, and bilateral ribs, no soft tissue metastatic lesion.  Recommend Docetaxel Q3weeks x 6 cycles with D3 GCSF Labs are reviewed and discussed with patient and wife.  We discussed about potential side effects and also reviewed Dexamethasone instruction and antiemetics instruction. He will proceed with treatment tomorrow and get GSF on D3.  Recommend him to take Claritin 181mdaily x 4 days starting D3    Encounter for antineoplastic chemotherapy Treatment as listed above.   Stage 3b chronic kidney disease (HCSelmaEncourage oral hydration and avoid nephrotoxins.    Type 2 diabetes mellitus without complication, with long-term current use of insulin (HCC) On insulin, recommend patient to follow up with endocrinology/PCP    Orders Placed This Encounter  Procedures   PSA    Standing Status:   Future    Standing Expiration Date:   07/20/2023   CBC with Differential    Standing Status:   Future    Standing Expiration Date:   07/20/2023   Comprehensive metabolic panel    Standing Status:   Future    Standing Expiration Date:   07/20/2023   CBC with Differential ([facility])    Standing Status:   Future     Standing Expiration Date:   06/28/2023   Comprehensive metabolic panel    Standing Status:   Future    Standing Expiration Date:   06/28/2023    Follow up  1 week for evaluation of treatment tolerability 3 weeks lab MD Docetaxel D3 GCSF  All questions were answered. The patient knows to call the clinic with any problems, questions or concerns.  ZhEarlie ServerMD, PhD [facility] Oncology 06/27/2022   HISTORY OF PRESENTING ILLNESS:   Keith MOLESKYs a  [age].o.  male presents for follow up for prostate cancer Oncology History  Prostate cancer ([facility] 02/24/2020 Tumor Marker   PSA at Dr..Wolff's office 5.7.. Marland Kitchen 03/17/2020 Imaging    MRI of the prostate showed PI-RADS category 5 lesion of the left posterolateral and posteromedial peripheral zone in the base, mid gland, and apex, with involvement of the left central zone at the base. This lesion bulges the prosthetic capsular margin adjacent to the rectum, and there is a high suspicion for extraprostatic spread and early seminal vesicle invasion and likely left neurovascular bundle involvement. PI-RADS category 2 lesion in the right peripheral zone,considered PI-RADS category 2 given the nonfocal nature.. Encapsulated nodularity in the transition zone compatible with benign prostatic hypertrophy. Prostate volume 71.50 cubic cm.    04/08/2020 Initial Diagnosis   Prostate cancer ([place] dated back to 04/08/20.  Prostate biopsy showed prostate adenocarcinoma, 12 out of 12 cores involved.  Gleason score 10 [5+5] 2 cores, Gleason 9 [5+4] 1 core,  Gleason 9 [4+5] involving 8 cores.     05/13/2020 Imaging   CT abdomen pelvis without contrast showed moderately enlarged prostate, no evidence of abdominal or pelvic metastatic disease.    05/13/2020 Imaging   bone scan whole body showed no evidence of osseous metastasis    06/04/2020 -  Radiation Therapy   patient was seen by Dr. Baruch Gouty and underwent IMRT to prostate and  pelvic nodes.  Dr. Baruch Gouty asked Dr. Yves Dill to give adjuvant deprivation therapy.    12/29/2020 Tumor Marker   PSA 0.07   06/22/2021 -  Chemotherapy   Orgovyx 137m daily Xtandi 1618mdaily   07/01/2021 Tumor Marker   PSA 4.3 Patient was referred by Dr. ChBaruch Goutyo reestablish care with oncology for evaluation.  He was previously seen by me in the hematology clinic for abnormal SPEP and lost follow-up.  Oncology was not involved previously for treatment of prostate cancer.    07/13/2021 Cancer Staging   Staging form: Prostate, AJCC 8th Edition - Clinical: Stage Unknown (rcTX, cNX) - Signed by YuEarlie ServerMD on 07/13/2021 Stage prefix: Recurrence   10/04/2021 Tumor Marker   PSA at Urologist office  <0.1   01/02/2022 Tumor Marker   PSA at urologist office 0.5   05/26/2022 Tumor Marker   PSA 9.59  Previous prostate cancer treatment was with Dr. WoYves Dill 05/26/2022  Patient presented to reestablish care as Dr. WoYves Dilletired   06/16/2022 Cancer Staging   Staging form: Prostate, AJCC 8th Edition - Pathologic stage from 06/16/2022: Stage IVB (pNX, cM1b, Grade Group: 5) - Signed by YuEarlie ServerMD on 06/16/2022 Histologic grading system: 5 grade system   06/16/2022 Tumor Marker   PSA  21.45   06/28/2022 -  Chemotherapy   Patient is on Treatment Plan : PROSTATE Docetaxel (75[address]      [place] [age].o. male who has above history reviewed by me today presents for follow up visit for prostate cancer. Patient has decided to proceed with chemotherapy and has had chemotherapy class.  Accompanied by wife today.  takes Orgovyx 120 mg daily and Xtandi 160 mg daily.     Review of Systems  Constitutional:  Negative for appetite change, chills, fatigue, fever and unexpected weight change.  HENT:   Negative for hearing loss and voice change.   Eyes:  Negative for eye problems and icterus.  Respiratory:  Negative for chest tightness, cough and shortness of breath.   Cardiovascular:   Negative for chest pain and leg swelling.  Gastrointestinal:  Negative for abdominal distention and abdominal pain.  Endocrine: Negative for hot flashes.  Genitourinary:  Negative for difficulty urinating, dysuria and frequency.   Musculoskeletal:  Negative for arthralgias.  Skin:  Negative for itching and rash.  Neurological:  Negative for light-headedness and numbness.  Hematological:  Negative for adenopathy. Does not bruise/bleed easily.  Psychiatric/Behavioral:  Negative for confusion.     MEDICAL HISTORY:  Past Medical History:  Diagnosis Date   BPH (benign prostatic hyperplasia)    Cancer (HCC)    Chronic kidney disease    STAGE 3   Diabetes mellitus without complication (HCC)    HOH (hard of hearing)    AIDS   Hypertension    Inguinal hernia    Pain    CHRONIC LBP   Prostate cancer ([place]    SURGICAL HISTORY: Past Surgical History:  Procedure Laterality Date   BACK SURGERY     Lumbar  CATARACT EXTRACTION W/PHACO Right 07/10/2017   Procedure: CATARACT EXTRACTION PHACO AND INTRAOCULAR LENS PLACEMENT (IOC);  Surgeon: Birder Robson, MD;  Location: [facility] ORS;  Service: Ophthalmology;  Laterality: Right;  [place] 00:29.2 AP% 16.5 CDE 4.83 Fluid Pack Lot # [id number]   COLONOSCOPY     EYE SURGERY Bilateral    cataract extraction   HERNIA REPAIR     INGUINAL   PROSTATE BIOPSY N/A 04/08/2020   Procedure: PROSTATE BIOPSY [name];  Surgeon: Royston Cowper, MD;  Location: [facility] ORS;  Service: Urology;  Laterality: N/A;   TOTAL KNEE ARTHROPLASTY Left 03/19/2018   Procedure: TOTAL KNEE ARTHROPLASTY;  Surgeon: Thornton Park, MD;  Location: [facility] ORS;  Service: Orthopedics;  Laterality: Left;    SOCIAL HISTORY: Social History   Socioeconomic History   Marital status: Married    Spouse name: Not on file   Number of children: Not on file   Years of education: Not on file   Highest education level: Some college, no degree  Occupational History   Not on file  Tobacco Use    Smoking status: Never   Smokeless tobacco: Never  Vaping Use   Vaping Use: Never used  Substance and Sexual Activity   Alcohol use: No   Drug use: Never   Sexual activity: Not Currently  Other Topics Concern   Not on file  Social History Narrative   Independent at baseline.  Lives at home with his wife   Social Determinants of Health   Financial Resource Strain: Low Risk  (05/12/2021)   Overall Financial Resource Strain (CARDIA)    Difficulty of Paying Living Expenses: Not hard at all  Food Insecurity: No Food Insecurity (05/12/2021)   Hunger Vital Sign    Worried About Running Out of Food in the Last Year: Never true    Ran Out of Food in the Last Year: Never true  Transportation Needs: No Transportation Needs (05/12/2021)   PRAPARE - Hydrologist (Medical): No    Lack of Transportation (Non-Medical): No  Physical Activity: Insufficiently Active (05/12/2021)   Exercise Vital Sign    Days of Exercise per Week: 7 days    Minutes of Exercise per Session: 10 min  Stress: No Stress Concern Present (05/12/2021)   [place]    Feeling of Stress : Not at all  Social Connections: [place] (05/12/2021)   Social Connection and Isolation Panel [NHANES]    Frequency of Communication with Friends and Family: More than three times a week    Frequency of Social Gatherings with Friends and Family: Once a week    Attends Religious Services: More than 4 times per year    Active Member of [employer] or Organizations: Yes    Attends Music therapist: More than 4 times per year    Marital Status: Married  Human resources officer Violence: Not At Risk (05/12/2021)   Humiliation, Afraid, Rape, and Kick questionnaire    Fear of Current or Ex-Partner: No    Emotionally Abused: No    Physically Abused: No    Sexually Abused: No    FAMILY HISTORY: Family History  Problem Relation Age of Onset    Diabetes Father    Breast cancer Sister     ALLERGIES:  has No Known Allergies.  MEDICATIONS:  Current Outpatient Medications  Medication Sig Dispense Refill   amLODipine (NORVASC) 5 MG tablet Take 5 mg by mouth daily.  aspirin EC 81 MG tablet Take 81 mg by mouth daily.     cholecalciferol (VITAMIN D) 1000 units tablet Take 1,000 Units by mouth daily.     dapagliflozin propanediol (FARXIGA) 10 MG TABS tablet Take by mouth daily.     dexamethasone (DECADRON) 4 MG tablet Take 2 tabs by mouth daily starting the day before chemo. Then take 2 tabs daily for 2 days starting day after chemo. Take with food. 36 tablet 1   dutasteride (AVODART) 0.5 MG capsule Take 0.5 mg by mouth daily.  3   enzalutamide (XTANDI) 40 MG capsule Take 160 mg by mouth daily.     losartan (COZAAR) 100 MG tablet Take 100 mg by mouth daily.     lovastatin (MEVACOR) 40 MG tablet TAKE 1 TABLET BY MOUTH EVERY DAY 90 tablet 3   metoprolol tartrate (LOPRESSOR) 50 MG tablet TAKE 1 TABLET BY MOUTH EVERY 12 HOURS 180 tablet 2   Misc Natural Products (PROSTATE SUPPORT PO) Take 2 capsules by mouth daily.     NOVOLOG FLEXPEN 100 UNIT/ML FlexPen Inject 7 Units into the skin 3 (three) times daily before meals.  0   Omega-3 Fatty Acids (FISH OIL PO) Take 1 capsule by mouth daily.      ondansetron (ZOFRAN) 8 MG tablet Take 1 tablet (8 mg total) by mouth every 8 (eight) hours as needed for nausea or vomiting. 30 tablet 1   prochlorperazine (COMPAZINE) 10 MG tablet Take 1 tablet (10 mg total) by mouth every 6 (six) hours as needed for nausea or vomiting. 30 tablet 1   Relugolix (ORGOVYX) 120 MG TABS Take 1 tablet (120 mg total) by mouth daily. 30 tablet 0   TRESIBA FLEXTOUCH 200 UNIT/ML SOPN Inject 80 Units into the muscle daily before breakfast.   2   TRULICITY 1.5 0000000 SOPN Inject 1.5 mg into the muscle every Wednesday.   1   vitamin B-12 (CYANOCOBALAMIN) 500 MCG tablet Take 500 mcg by mouth daily.     No current  facility-administered medications for this visit.     PHYSICAL EXAMINATION: ECOG PERFORMANCE STATUS: 1 - Symptomatic but completely ambulatory Vitals:   06/27/22 0924  BP: (!) 146/68  Pulse: 79  Resp: 18  Temp: (!) 96.6 F (35.9 C)  SpO2: 99%   Filed Weights   06/27/22 0924  Weight: 241 lb 12.8 oz (109.7 kg)    Physical Exam Constitutional:      General: He is not in acute distress. HENT:     Head: Normocephalic and atraumatic.  Eyes:     General: No scleral icterus. Cardiovascular:     Rate and Rhythm: Normal rate and regular rhythm.     Heart sounds: Normal heart sounds.  Pulmonary:     Effort: Pulmonary effort is normal. No respiratory distress.     Breath sounds: No wheezing.  Abdominal:     General: Bowel sounds are normal. There is no distension.     Palpations: Abdomen is soft.  Musculoskeletal:        General: No deformity. Normal range of motion.     Cervical back: Normal range of motion and neck supple.  Skin:    General: Skin is warm and dry.     Findings: No erythema or rash.  Neurological:     Mental Status: He is alert and oriented to person, place, and time. Mental status is at baseline.     Cranial Nerves: No cranial nerve deficit.     Coordination: Coordination  normal.  Psychiatric:        Mood and Affect: Mood normal.     LABORATORY DATA:  I have reviewed the data as listed    Latest Ref Rng & Units 06/27/2022    9:01 AM 05/26/2022   12:19 PM 08/11/2020    8:58 AM  CBC  WBC 4.0 - 10.5 K/uL 4.0  3.9  3.8   Hemoglobin 13.0 - 17.0 g/dL 12.8  13.6  11.6   Hematocrit 39.0 - 52.0 % 39.5  40.8  35.2   Platelets 150 - 400 K/uL 180  174  137       Latest Ref Rng & Units 06/27/2022    9:01 AM 05/26/2022   12:19 PM 05/13/2020    7:56 AM  CMP  Glucose 70 - 99 mg/dL 77  161    BUN 8 - 23 mg/dL 30  26    Creatinine 0.61 - 1.24 mg/dL 1.67  1.50  1.70   Sodium 135 - 145 mmol/L 141  138    Potassium 3.5 - 5.1 mmol/L 4.0  4.5    Chloride 98 - 111  mmol/L 104  102    CO2 22 - 32 mmol/L 27  27    Calcium 8.9 - 10.3 mg/dL 9.6  8.7    Total Protein 6.5 - 8.1 g/dL 7.4  7.5    Total Bilirubin 0.3 - 1.2 mg/dL 0.7  0.3    Alkaline Phos 38 - 126 U/L 56  71    AST 15 - 41 U/L 18  22    ALT 0 - 44 U/L 12  17      RADIOGRAPHIC STUDIES: I have personally reviewed the radiological images as listed and agreed with the findings in the report. NM PET (PSMA) SKULL TO MID THIGH  Result Date: 06/15/2022 CLINICAL DATA:  Prostate carcinoma with biochemical recurrence. EXAM: NUCLEAR MEDICINE PET SKULL BASE TO THIGH TECHNIQUE: 9.3 mCi F18 Piflufolastat (Pylarify) was injected intravenously. Full-ring PET imaging was performed from the skull base to thigh after the radiotracer. CT data was obtained and used for attenuation correction and anatomic localization. COMPARISON:  CT on 05/13/2020 FINDINGS: NECK No radiotracer activity in neck lymph nodes. Incidental CT finding: None. CHEST No radiotracer accumulation within mediastinal or hilar lymph nodes. No suspicious pulmonary nodules on the CT scan. Incidental CT finding: Aortic and coronary atherosclerotic calcification incidentally noted. ABDOMEN/PELVIS Prostate: No focal activity in the prostate bed. Lymph nodes: No abnormal radiotracer accumulation within pelvic or abdominal nodes. Liver: No evidence of liver metastasis. Incidental CT finding: Stable moderately enlarged prostate gland, distended urinary bladder increased moderate bilateral hydroureteronephrosis to the level of the urinary bladder. These findings are consistent with chronic bladder outlet obstruction and vesicoureteral reflux. SKELETON Several bone metastases with intense radiotracer accumulation are seen in the left scapula, thoracic spine and several bilateral ribs. IMPRESSION: Several radiotracer avid bone metastases in the left scapula, thoracic spine, and bilateral ribs. No evidence of soft tissue metastatic disease. Stable enlarged prostate, with  findings of chronic bladder outlet obstruction and vesicoureteral reflux. Aortic Atherosclerosis (ICD10-I70.0). Electronically Signed   By: Marlaine Hind M.D.   On: 06/15/2022 08:48

## 2022-06-27 NOTE — Assessment & Plan Note (Signed)
Treatment as listed above 

## 2022-06-28 ENCOUNTER — Encounter: Payer: Self-pay | Admitting: Oncology

## 2022-06-28 ENCOUNTER — Inpatient Hospital Stay: Payer: Medicare HMO

## 2022-06-28 VITALS — BP 155/82 | HR 68 | Temp 98.4°F | Resp 18

## 2022-06-28 DIAGNOSIS — C61 Malignant neoplasm of prostate: Secondary | ICD-10-CM

## 2022-06-28 DIAGNOSIS — Z5111 Encounter for antineoplastic chemotherapy: Secondary | ICD-10-CM | POA: Diagnosis not present

## 2022-06-28 LAB — TESTOSTERONE: Testosterone: 42 ng/dL — ABNORMAL LOW (ref 264–916)

## 2022-06-28 MED ORDER — SODIUM CHLORIDE 0.9 % IV SOLN
10.0000 mg | Freq: Once | INTRAVENOUS | Status: AC
  Administered 2022-06-28: 10 mg via INTRAVENOUS
  Filled 2022-06-28: qty 10

## 2022-06-28 MED ORDER — SODIUM CHLORIDE 0.9 % IV SOLN
70.0000 mg/m2 | Freq: Once | INTRAVENOUS | Status: AC
  Administered 2022-06-28: 160 mg via INTRAVENOUS
  Filled 2022-06-28: qty 16

## 2022-06-28 MED ORDER — SODIUM CHLORIDE 0.9 % IV SOLN
Freq: Once | INTRAVENOUS | Status: AC
  Filled 2022-06-28: qty 250

## 2022-06-28 NOTE — Patient Instructions (Addendum)
[facility]  Discharge Instructions: Thank you for choosing [facility] to provide your oncology and hematology care.  If you have a lab appointment with the [facility], please go directly to the [facility] and check in at the registration area.  Wear comfortable clothing and clothing appropriate for easy access to any Portacath or PICC line.   We strive to give you quality time with your provider. You may need to reschedule your appointment if you arrive late (15 or more minutes).  Arriving late affects you and other patients whose appointments are after yours.  Also, if you miss three or more appointments without notifying the office, you may be dismissed from the clinic at the provider's discretion.      For prescription refill requests, have your pharmacy contact our office and allow 72 hours for refills to be completed.    Today you received the following chemotherapy and/or immunotherapy agents Docetaxel    To help prevent nausea and vomiting after your treatment, we encourage you to take your nausea medication as directed.  BELOW ARE SYMPTOMS THAT SHOULD BE REPORTED IMMEDIATELY: *FEVER GREATER THAN 100.4 F (38 C) OR HIGHER *CHILLS OR SWEATING *NAUSEA AND VOMITING THAT IS NOT CONTROLLED WITH YOUR NAUSEA MEDICATION *UNUSUAL SHORTNESS OF BREATH *UNUSUAL BRUISING OR BLEEDING *URINARY PROBLEMS (pain or burning when urinating, or frequent urination) *BOWEL PROBLEMS (unusual diarrhea, constipation, pain near the anus) TENDERNESS IN MOUTH AND THROAT WITH OR WITHOUT PRESENCE OF ULCERS (sore throat, sores in mouth, or a toothache) UNUSUAL RASH, SWELLING OR PAIN  UNUSUAL VAGINAL DISCHARGE OR ITCHING   Items with * indicate a potential emergency and should be followed up as soon as possible or go to the Emergency Department if any problems should occur.  Please show the CHEMOTHERAPY ALERT CARD or IMMUNOTHERAPY ALERT CARD at check-in to  the Emergency Department and triage nurse.  Should you have questions after your visit or need to cancel or reschedule your appointment, please contact [facility]  [phone] and follow the prompts.  Office hours are 8:00 a.m. to 4:30 p.m. Monday - Friday. Please note that voicemails left after 4:00 p.m. may not be returned until the following business day.  We are closed weekends and major holidays. You have access to a nurse at all times for urgent questions. Please call the main number to the clinic [phone] and follow the prompts.  For any non-urgent questions, you may also contact your provider using MyChart. We now offer e-Visits for anyone 31 and older to request care online for non-urgent symptoms. For details visit [email or web address].   Also download the MyChart app! Go to the app store, search "MyChart", open the app, select [facility], and log in with your MyChart username and password.  Docetaxel Injection What is this medication? DOCETAXEL (doe se TAX el) treats some types of cancer. It works by slowing down the growth of cancer cells. This medicine may be used for other purposes; ask your health care provider or pharmacist if you have questions. COMMON BRAND NAME(S): Docefrez, Taxotere What should I tell my care team before I take this medication? They need to know if you have any of these conditions: Kidney disease Liver disease Low white blood cell levels Tingling of the fingers or toes or other nerve disorder An unusual or allergic reaction to docetaxel, polysorbate 80, other medications, foods, dyes, or preservatives Pregnant or trying to get pregnant Breast-feeding How should  I use this medication? This medication is injected into a vein. It is given by your care team in a hospital or clinic setting. Talk to your care team about the use of this medication in children. Special care may be needed. Overdosage: If you think you  have taken too much of this medicine contact a poison control center or emergency room at once. NOTE: This medicine is only for you. Do not share this medicine with others. What if I miss a dose? Keep appointments for follow-up doses. It is important not to miss your dose. Call your care team if you are unable to keep an appointment. What may interact with this medication? Do not take this medication with any of the following: Live virus vaccines This medication may also interact with the following: Certain antibiotics, such as clarithromycin, telithromycin Certain antivirals for HIV or hepatitis Certain medications for fungal infections, such as itraconazole, ketoconazole, voriconazole Grapefruit juice Nefazodone Supplements, such as St. John's wort This list may not describe all possible interactions. Give your health care provider a list of all the medicines, herbs, non-prescription drugs, or dietary supplements you use. Also tell them if you smoke, drink alcohol, or use illegal drugs. Some items may interact with your medicine. What should I watch for while using this medication? This medication may make you feel generally unwell. This is not uncommon as chemotherapy can affect healthy cells as well as cancer cells. Report any side effects. Continue your course of treatment even though you feel ill unless your care team tells you to stop. You may need blood work done while you are taking this medication. This medication can cause serious side effects and infusion reactions. To reduce the risk, your care team may give you other medications to take before receiving this one. Be sure to follow the directions from your care team. This medication may increase your risk of getting an infection. Call your care team for advice if you get a fever, chills, sore throat, or other symptoms of a cold or flu. Do not treat yourself. Try to avoid being around people who are sick. Avoid taking medications that  contain aspirin, acetaminophen, ibuprofen, naproxen, or ketoprofen unless instructed by your care team. These medications may hide a fever. Be careful brushing or flossing your teeth or using a toothpick because you may get an infection or bleed more easily. If you have any dental work done, tell your dentist you are receiving this medication. Some products may contain alcohol. Ask your care team if this medication contains alcohol. Be sure to tell all care teams you are taking this medicine. Certain medications, like metronidazole and disulfiram, can cause an unpleasant reaction when taken with alcohol. The reaction includes flushing, headache, nausea, vomiting, sweating, and increased thirst. The reaction can last from 30 minutes to several hours. This medication may affect your coordination, reaction time, or judgement. Do not drive or operate machinery until you know how this medication affects you. Sit up or stand slowly to reduce the risk of dizzy or fainting spells. Drinking alcohol with this medication can increase the risk of these side effects. Talk to your care team about your risk of cancer. You may be more at risk for certain types of cancer if you take this medication. Talk to your care team if you wish to become pregnant or think you might be pregnant. This medication can cause serious birth defects if taken during pregnancy or if you get pregnant within 2 months after stopping  therapy. A negative pregnancy test is required before starting this medication. A reliable form of contraception is recommended while taking this medication and for 2 months after stopping it. Talk to your care team about reliable forms of contraception. Do not breast-feed while taking this medication and for 1 week after stopping therapy. Use a condom during sex and for 4 months after stopping therapy. Tell your care team right away if you think your partner might be pregnant. This medication can cause serious birth  defects. This medication may cause infertility. Talk to your care team if you are concerned about your fertility. What side effects may I notice from receiving this medication? Side effects that you should report to your care team as soon as possible: Allergic reactions--skin rash, itching, hives, swelling of the face, lips, tongue, or throat Change in vision such as blurry vision, seeing halos around lights, vision loss Infection--fever, chills, cough, or sore throat Infusion reactions--chest pain, shortness of breath or trouble breathing, feeling faint or lightheaded Low red blood cell level--unusual weakness or fatigue, dizziness, headache, trouble breathing Pain, tingling, or numbness in the hands or feet Painful swelling, warmth, or redness of the skin, blisters or sores at the infusion site Redness, blistering, peeling, or loosening of the skin, including inside the mouth Sudden or severe stomach pain, bloody diarrhea, fever, nausea, vomiting Swelling of the ankles, hands, or feet Tumor lysis syndrome (TLS)--nausea, vomiting, diarrhea, decrease in the amount of urine, dark urine, unusual weakness or fatigue, confusion, muscle pain or cramps, fast or irregular heartbeat, joint pain Unusual bruising or bleeding Side effects that usually do not require medical attention (report to your care team if they continue or are bothersome): Change in nail shape, thickness, or color Change in taste Hair loss Increased tears This list may not describe all possible side effects. Call your doctor for medical advice about side effects. You may report side effects to FDA at 1-800-FDA-1088. Where should I keep my medication? This medication is given in a hospital or clinic. It will not be stored at home. NOTE: This sheet is a summary. It may not cover all possible information. If you have questions about this medicine, talk to your doctor, pharmacist, or health care provider.  2023 [employer]  Standard (2007-06-15 00:00:00)

## 2022-06-28 NOTE — Addendum Note (Signed)
Addended by: Earlie Server on: 06/28/2022 08:10 AM   Modules accepted: Orders

## 2022-06-29 ENCOUNTER — Telehealth: Payer: Self-pay

## 2022-06-29 NOTE — Telephone Encounter (Signed)
Telephone call to patient for follow up after receiving first infusion.   Patient states infusion went great.  States eating good and drinking plenty of fluids.   Denies any nausea or vomiting.  Encouraged patient to call for any concerns or questions. 

## 2022-06-30 ENCOUNTER — Inpatient Hospital Stay: Payer: Medicare HMO

## 2022-06-30 DIAGNOSIS — C61 Malignant neoplasm of prostate: Secondary | ICD-10-CM

## 2022-06-30 DIAGNOSIS — Z5111 Encounter for antineoplastic chemotherapy: Secondary | ICD-10-CM | POA: Diagnosis not present

## 2022-06-30 MED ORDER — PEGFILGRASTIM-JMDB 6 MG/0.6ML ~~LOC~~ SOSY
6.0000 mg | PREFILLED_SYRINGE | Freq: Once | SUBCUTANEOUS | Status: AC
  Administered 2022-06-30: 6 mg via SUBCUTANEOUS
  Filled 2022-06-30: qty 0.6

## 2022-07-05 ENCOUNTER — Inpatient Hospital Stay: Payer: Medicare HMO

## 2022-07-05 ENCOUNTER — Encounter: Payer: Self-pay | Admitting: Oncology

## 2022-07-05 ENCOUNTER — Inpatient Hospital Stay (HOSPITAL_BASED_OUTPATIENT_CLINIC_OR_DEPARTMENT_OTHER): Payer: Medicare HMO | Admitting: Oncology

## 2022-07-05 VITALS — BP 135/63 | HR 84 | Temp 97.9°F | Resp 18 | Wt 240.9 lb

## 2022-07-05 DIAGNOSIS — Z5111 Encounter for antineoplastic chemotherapy: Secondary | ICD-10-CM

## 2022-07-05 DIAGNOSIS — E119 Type 2 diabetes mellitus without complications: Secondary | ICD-10-CM | POA: Diagnosis not present

## 2022-07-05 DIAGNOSIS — N1832 Chronic kidney disease, stage 3b: Secondary | ICD-10-CM

## 2022-07-05 DIAGNOSIS — D701 Agranulocytosis secondary to cancer chemotherapy: Secondary | ICD-10-CM

## 2022-07-05 DIAGNOSIS — T451X5A Adverse effect of antineoplastic and immunosuppressive drugs, initial encounter: Secondary | ICD-10-CM | POA: Insufficient documentation

## 2022-07-05 DIAGNOSIS — C61 Malignant neoplasm of prostate: Secondary | ICD-10-CM

## 2022-07-05 DIAGNOSIS — Z794 Long term (current) use of insulin: Secondary | ICD-10-CM

## 2022-07-05 LAB — CBC WITH DIFFERENTIAL (CANCER CENTER ONLY)
Abs Immature Granulocytes: 0.07 10*3/uL (ref 0.00–0.07)
Basophils Absolute: 0.1 10*3/uL (ref 0.0–0.1)
Basophils Relative: 3 %
Eosinophils Absolute: 0.1 10*3/uL (ref 0.0–0.5)
Eosinophils Relative: 3 %
HCT: 35.3 % — ABNORMAL LOW (ref 39.0–52.0)
Hemoglobin: 11.8 g/dL — ABNORMAL LOW (ref 13.0–17.0)
Immature Granulocytes: 5 %
Lymphocytes Relative: 17 %
Lymphs Abs: 0.3 10*3/uL — ABNORMAL LOW (ref 0.7–4.0)
MCH: 28.4 pg (ref 26.0–34.0)
MCHC: 33.4 g/dL (ref 30.0–36.0)
MCV: 84.9 fL (ref 80.0–100.0)
Monocytes Absolute: 0.4 10*3/uL (ref 0.1–1.0)
Monocytes Relative: 26 %
Neutro Abs: 0.7 10*3/uL — ABNORMAL LOW (ref 1.7–7.7)
Neutrophils Relative %: 46 %
Platelet Count: 160 10*3/uL (ref 150–400)
RBC: 4.16 MIL/uL — ABNORMAL LOW (ref 4.22–5.81)
RDW: 12.2 % (ref 11.5–15.5)
Smear Review: NORMAL
WBC Count: 1.5 10*3/uL — ABNORMAL LOW (ref 4.0–10.5)
nRBC: 0 % (ref 0.0–0.2)

## 2022-07-05 LAB — COMPREHENSIVE METABOLIC PANEL
ALT: 13 U/L (ref 0–44)
AST: 17 U/L (ref 15–41)
Albumin: 3.7 g/dL (ref 3.5–5.0)
Alkaline Phosphatase: 54 U/L (ref 38–126)
Anion gap: 11 (ref 5–15)
BUN: 27 mg/dL — ABNORMAL HIGH (ref 8–23)
CO2: 25 mmol/L (ref 22–32)
Calcium: 9 mg/dL (ref 8.9–10.3)
Chloride: 102 mmol/L (ref 98–111)
Creatinine, Ser: 1.65 mg/dL — ABNORMAL HIGH (ref 0.61–1.24)
GFR, Estimated: 42 mL/min — ABNORMAL LOW (ref >60)
Glucose, Bld: 84 mg/dL (ref 70–99)
Potassium: 3.8 mmol/L (ref 3.5–5.1)
Sodium: 138 mmol/L (ref 135–145)
Total Bilirubin: 0.6 mg/dL (ref 0.3–1.2)
Total Protein: 7 g/dL (ref 6.5–8.1)

## 2022-07-05 NOTE — Assessment & Plan Note (Signed)
Treatment as listed above 

## 2022-07-05 NOTE — Progress Notes (Signed)
Hematology/Oncology Progress note [phone] Fax:([phone]) [phone]      CHIEF COMPLAINTS/REASON FOR VISIT:  Metastatic prostate cancer  ASSESSMENT & PLAN:   Cancer Staging  Prostate cancer [facility]) Staging form: Prostate, AJCC 8th Edition - Clinical: Stage Unknown (rcTX, cNX) - Signed by Earlie Server, MD on 07/13/2021 - Pathologic stage from 06/16/2022: Stage IVB (pNX, cM1b, Grade Group: 5) - Signed by Earlie Server, MD on 06/16/2022   Prostate cancer [facility]) Prostate cancer, Gleason score 10, s/p RT[2022]- biochemical recurrence 06/30/2021 Currently on ADT with Orgovyx 120 mg daily, Xtandi '160mg'$  daily.  PSA has increased to 9, repeat PSA further increased to 21.  PMSA showed disease progression with Several radiotracer avid bone metastases in the left scapula, thoracic spine, and bilateral ribs, no soft tissue metastatic lesion.  Recommend Docetaxel Q3weeks x 6 cycles with D3 GCSF Labs are reviewed and discussed with patient and wife.  S/p Docetaxel '70mg'$ /m2 with GCSF support.  Overall he tolerates well.  Continue Orgovyx '120mg'$  daily  Encounter for antineoplastic chemotherapy Treatment as listed above.   Stage 3b chronic kidney disease ([place]) Encourage oral hydration and avoid nephrotoxins.    Type 2 diabetes mellitus without complication, with long-term current use of insulin (HCC) On insulin, recommend patient to follow up with endocrinology/PCP   Chemotherapy induced neutropenia ([place]) Discussed about neutropenia precaution.  Patient has received G-CSF   Orders Placed This Encounter  Procedures   PSA    Standing Status:   Future    Standing Expiration Date:   08/10/2023   CBC with Differential    Standing Status:   Future    Standing Expiration Date:   08/10/2023   Comprehensive metabolic panel    Standing Status:   Future    Standing Expiration Date:   08/10/2023   PSA    Standing Status:   Future    Standing Expiration Date:   08/31/2023   CBC with Differential    Standing  Status:   Future    Standing Expiration Date:   08/31/2023   Comprehensive metabolic panel    Standing Status:   Future    Standing Expiration Date:   08/31/2023    Follow up  3 weeks lab MD Docetaxel D3 GCSF  All questions were answered. The patient knows to call the clinic with any problems, questions or concerns.  Earlie Server, MD, PhD [facility] Hematology Oncology 07/05/2022   HISTORY OF PRESENTING ILLNESS:   Keith Howe is a  82 y.o.  male presents for follow up for prostate cancer Oncology History  Prostate cancer ([place])  02/24/2020 Tumor Marker   PSA at Dr..Wolff's office 5.7.Marland Kitchen   03/17/2020 Imaging    MRI of the prostate showed PI-RADS category 5 lesion of the left posterolateral and posteromedial peripheral zone in the base, mid gland, and apex, with involvement of the left central zone at the base. This lesion bulges the prosthetic capsular margin adjacent to the rectum, and there is a high suspicion for extraprostatic spread and early seminal vesicle invasion and likely left neurovascular bundle involvement. PI-RADS category 2 lesion in the right peripheral zone,considered PI-RADS category 2 given the nonfocal nature.. Encapsulated nodularity in the transition zone compatible with benign prostatic hypertrophy. Prostate volume 71.50 cubic cm.    04/08/2020 Initial Diagnosis   Prostate cancer ([employer]) prostate cancer history dated back to 04/08/20.  Prostate biopsy showed prostate adenocarcinoma, 12 out of 12 cores involved.  Gleason score 10 [5+5] 2 cores, Gleason 9 [5+4] 1 core,  Gleason 9 [4+5] involving 8 cores.     05/13/2020 Imaging   CT abdomen pelvis without contrast showed moderately enlarged prostate, no evidence of abdominal or pelvic metastatic disease.    05/13/2020 Imaging   bone scan whole body showed no evidence of osseous metastasis    06/04/2020 -  Radiation Therapy   patient was seen by Dr. Baruch Gouty and underwent IMRT to prostate and pelvic nodes.  Dr. Baruch Gouty  asked Dr. Yves Dill to give adjuvant deprivation therapy.    12/29/2020 Tumor Marker   PSA 0.07   06/22/2021 -  Chemotherapy   Orgovyx '120mg'$  daily Xtandi '160mg'$  daily   07/01/2021 Tumor Marker   PSA 4.3 Patient was referred by Dr. Baruch Gouty to reestablish care with oncology for evaluation.  He was previously seen by me in the hematology clinic for abnormal SPEP and lost follow-up.  Oncology was not involved previously for treatment of prostate cancer.    07/13/2021 Cancer Staging   Staging form: Prostate, AJCC 8th Edition - Clinical: Stage Unknown (rcTX, cNX) - Signed by Earlie Server, MD on 07/13/2021 Stage prefix: Recurrence   10/04/2021 Tumor Marker   PSA at Urologist office  <0.1   01/02/2022 Tumor Marker   PSA at urologist office 0.5   05/26/2022 Tumor Marker   PSA 9.59  Previous prostate cancer treatment was with Dr. Yves Dill.  05/26/2022  Patient presented to reestablish care as Dr. Yves Dill retired   06/15/2022 Imaging   PMSA showed Several radiotracer avid bone metastases in the left scapula, thoracic spine, and bilateral ribs. No evidence of soft tissue metastatic disease. Stable enlarged prostate, with findings of chronic bladder outlet obstruction and vesicoureteral reflux. Aortic Atherosclerosis     06/16/2022 Cancer Staging   Staging form: Prostate, AJCC 8th Edition - Pathologic stage from 06/16/2022: Stage IVB (pNX, cM1b, Grade Group: 5) - Signed by Earlie Server, MD on 06/16/2022 Histologic grading system: 5 grade system   06/16/2022 Tumor Marker   PSA  21.45   06/28/2022 -  Chemotherapy   Patient is on Treatment Plan : PROSTATE Docetaxel (72)       INTERVAL HISTORY Keith Howe is a 82 y.o. male who has above history reviewed by me today presents for follow up visit for prostate cancer. Patient has decided to proceed with chemotherapy and has had chemotherapy class.  Accompanied by wife today.  takes Orgovyx 120 mg daily Of Xtandi.  Status post first cycle of docetaxel with G-CSF  support.  Overall he tolerates well, denies any neuropathy symptoms.  No nausea vomiting diarrhea.    Review of Systems  Constitutional:  Negative for appetite change, chills, fatigue, fever and unexpected weight change.  HENT:   Negative for hearing loss and voice change.   Eyes:  Negative for eye problems and icterus.  Respiratory:  Negative for chest tightness, cough and shortness of breath.   Cardiovascular:  Negative for chest pain and leg swelling.  Gastrointestinal:  Negative for abdominal distention and abdominal pain.  Endocrine: Negative for hot flashes.  Genitourinary:  Negative for difficulty urinating, dysuria and frequency.   Musculoskeletal:  Negative for arthralgias.  Skin:  Negative for itching and rash.  Neurological:  Negative for light-headedness and numbness.  Hematological:  Negative for adenopathy. Does not bruise/bleed easily.  Psychiatric/Behavioral:  Negative for confusion.     MEDICAL HISTORY:  Past Medical History:  Diagnosis Date   BPH (benign prostatic hyperplasia)    Cancer (HCC)    Chronic kidney disease  STAGE 3   Diabetes mellitus without complication (HCC)    HOH (hard of hearing)    AIDS   Hypertension    Inguinal hernia    Pain    CHRONIC LBP   Prostate cancer [facility])     SURGICAL HISTORY: Past Surgical History:  Procedure Laterality Date   BACK SURGERY     Lumbar   CATARACT EXTRACTION W/PHACO Right 07/10/2017   Procedure: CATARACT EXTRACTION PHACO AND INTRAOCULAR LENS PLACEMENT (IOC);  Surgeon: Birder Robson, MD;  Location: [facility] ORS;  Service: Ophthalmology;  Laterality: Right;  [place] 00:29.2 AP% 16.5 CDE 4.83 Fluid Pack Lot # [id number]   COLONOSCOPY     EYE SURGERY Bilateral    cataract extraction   HERNIA REPAIR     INGUINAL   PROSTATE BIOPSY N/A 04/08/2020   Procedure: PROSTATE BIOPSY [name];  Surgeon: Royston Cowper, MD;  Location: [facility] ORS;  Service: Urology;  Laterality: N/A;   TOTAL KNEE ARTHROPLASTY Left 03/19/2018    Procedure: TOTAL KNEE ARTHROPLASTY;  Surgeon: Thornton Park, MD;  Location: [facility] ORS;  Service: Orthopedics;  Laterality: Left;    SOCIAL HISTORY: Social History   Socioeconomic History   Marital status: Married    Spouse name: Not on file   Number of children: Not on file   Years of education: Not on file   Highest education level: Some college, no degree  Occupational History   Not on file  Tobacco Use   Smoking status: Never   Smokeless tobacco: Never  Vaping Use   Vaping Use: Never used  Substance and Sexual Activity   Alcohol use: No   Drug use: Never   Sexual activity: Not Currently  Other Topics Concern   Not on file  Social History Narrative   Independent at baseline.  Lives at home with his wife   Social Determinants of Health   Financial Resource Strain: Low Risk  (05/12/2021)   Overall Financial Resource Strain (CARDIA)    Difficulty of Paying Living Expenses: Not hard at all  Food Insecurity: No Food Insecurity (05/12/2021)   Hunger Vital Sign    Worried About Running Out of Food in the Last Year: Never true    Ran Out of Food in the Last Year: Never true  Transportation Needs: No Transportation Needs (05/12/2021)   PRAPARE - Hydrologist (Medical): No    Lack of Transportation (Non-Medical): No  Physical Activity: Insufficiently Active (05/12/2021)   Exercise Vital Sign    Days of Exercise per Week: 7 days    Minutes of Exercise per Session: 10 min  Stress: No Stress Concern Present (05/12/2021)   [place]    Feeling of Stress : Not at all  Social Connections: [place] (05/12/2021)   Social Connection and Isolation Panel [NHANES]    Frequency of Communication with Friends and Family: More than three times a week    Frequency of Social Gatherings with Friends and Family: Once a week    Attends Religious Services: More than 4 times per year    Active Member  of [employer] or Organizations: Yes    Attends Archivist Meetings: More than 4 times per year    Marital Status: Married  Human resources officer Violence: Not At Risk (05/12/2021)   Humiliation, Afraid, Rape, and Kick questionnaire    Fear of Current or Ex-Partner: No    Emotionally Abused: No    Physically Abused: No  Sexually Abused: No    FAMILY HISTORY: Family History  Problem Relation Age of Onset   Diabetes Father    Breast cancer Sister     ALLERGIES:  has No Known Allergies.  MEDICATIONS:  Current Outpatient Medications  Medication Sig Dispense Refill   amLODipine (NORVASC) 5 MG tablet Take 5 mg by mouth daily.      aspirin EC 81 MG tablet Take 81 mg by mouth daily.     cholecalciferol (VITAMIN D) 1000 units tablet Take 1,000 Units by mouth daily.     dapagliflozin propanediol (FARXIGA) 10 MG TABS tablet Take by mouth daily.     dexamethasone (DECADRON) 4 MG tablet Take 2 tabs by mouth daily starting the day before chemo. Then take 2 tabs daily for 2 days starting day after chemo. Take with food. 36 tablet 1   dutasteride (AVODART) 0.5 MG capsule Take 0.5 mg by mouth daily.  3   losartan (COZAAR) 100 MG tablet Take 100 mg by mouth daily.     lovastatin (MEVACOR) 40 MG tablet TAKE 1 TABLET BY MOUTH EVERY DAY 90 tablet 3   metoprolol tartrate (LOPRESSOR) 50 MG tablet TAKE 1 TABLET BY MOUTH EVERY 12 HOURS 180 tablet 2   Misc Natural Products (PROSTATE SUPPORT PO) Take 2 capsules by mouth daily.     NOVOLOG FLEXPEN 100 UNIT/ML FlexPen Inject 7 Units into the skin 3 (three) times daily before meals.  0   Omega-3 Fatty Acids (FISH OIL PO) Take 1 capsule by mouth daily.      Relugolix (ORGOVYX) 120 MG TABS Take 1 tablet (120 mg total) by mouth daily. 30 tablet 0   TRESIBA FLEXTOUCH 200 UNIT/ML SOPN Inject 80 Units into the muscle daily before breakfast.   2   TRULICITY 1.5 0000000 SOPN Inject 1.5 mg into the muscle every Wednesday.   1   vitamin B-12 (CYANOCOBALAMIN) 500 MCG  tablet Take 500 mcg by mouth daily.     enzalutamide (XTANDI) 40 MG capsule Take 160 mg by mouth daily. (Patient not taking: Reported on 07/05/2022)     ondansetron (ZOFRAN) 8 MG tablet Take 1 tablet (8 mg total) by mouth every 8 (eight) hours as needed for nausea or vomiting. (Patient not taking: Reported on 07/05/2022) 30 tablet 1   prochlorperazine (COMPAZINE) 10 MG tablet Take 1 tablet (10 mg total) by mouth every 6 (six) hours as needed for nausea or vomiting. (Patient not taking: Reported on 07/05/2022) 30 tablet 1   No current facility-administered medications for this visit.     PHYSICAL EXAMINATION: ECOG PERFORMANCE STATUS: 1 - Symptomatic but completely ambulatory Vitals:   07/05/22 1005  BP: 135/63  Pulse: 84  Resp: 18  Temp: 97.9 F (36.6 C)   Filed Weights   07/05/22 1005  Weight: 240 lb 14.4 oz (109.3 kg)    Physical Exam Constitutional:      General: He is not in acute distress. HENT:     Head: Normocephalic and atraumatic.  Eyes:     General: No scleral icterus. Cardiovascular:     Rate and Rhythm: Normal rate and regular rhythm.     Heart sounds: Normal heart sounds.  Pulmonary:     Effort: Pulmonary effort is normal. No respiratory distress.     Breath sounds: No wheezing.  Abdominal:     General: Bowel sounds are normal. There is no distension.     Palpations: Abdomen is soft.  Musculoskeletal:        General:  No deformity. Normal range of motion.     Cervical back: Normal range of motion and neck supple.  Skin:    General: Skin is warm and dry.     Findings: No erythema or rash.  Neurological:     Mental Status: He is alert and oriented to person, place, and time. Mental status is at baseline.     Cranial Nerves: No cranial nerve deficit.     Coordination: Coordination normal.  Psychiatric:        Mood and Affect: Mood normal.     LABORATORY DATA:  I have reviewed the data as listed    Latest Ref Rng & Units 07/05/2022    9:11 AM 06/27/2022     9:01 AM 05/26/2022   12:19 PM  CBC  WBC 4.0 - 10.5 K/uL 1.5  4.0  3.9   Hemoglobin 13.0 - 17.0 g/dL 11.8  12.8  13.6   Hematocrit 39.0 - 52.0 % 35.3  39.5  40.8   Platelets 150 - 400 K/uL 160  180  174       Latest Ref Rng & Units 07/05/2022    9:11 AM 06/27/2022    9:01 AM 05/26/2022   12:19 PM  CMP  Glucose 70 - 99 mg/dL 84  77  161   BUN 8 - 23 mg/dL '27  30  26   '$ Creatinine 0.61 - 1.24 mg/dL 1.65  1.67  1.50   Sodium 135 - 145 mmol/L 138  141  138   Potassium 3.5 - 5.1 mmol/L 3.8  4.0  4.5   Chloride 98 - 111 mmol/L 102  104  102   CO2 22 - 32 mmol/L '25  27  27   '$ Calcium 8.9 - 10.3 mg/dL 9.0  9.6  8.7   Total Protein 6.5 - 8.1 g/dL 7.0  7.4  7.5   Total Bilirubin 0.3 - 1.2 mg/dL 0.6  0.7  0.3   Alkaline Phos 38 - 126 U/L 54  56  71   AST 15 - 41 U/L '17  18  22   '$ ALT 0 - 44 U/L '13  12  17     '$ RADIOGRAPHIC STUDIES: I have personally reviewed the radiological images as listed and agreed with the findings in the report. NM PET (PSMA) SKULL TO MID THIGH  Result Date: 06/15/2022 CLINICAL DATA:  Prostate carcinoma with biochemical recurrence. EXAM: NUCLEAR MEDICINE PET SKULL BASE TO THIGH TECHNIQUE: 9.3 mCi F18 Piflufolastat (Pylarify) was injected intravenously. Full-ring PET imaging was performed from the skull base to thigh after the radiotracer. CT data was obtained and used for attenuation correction and anatomic localization. COMPARISON:  CT on 05/13/2020 FINDINGS: NECK No radiotracer activity in neck lymph nodes. Incidental CT finding: None. CHEST No radiotracer accumulation within mediastinal or hilar lymph nodes. No suspicious pulmonary nodules on the CT scan. Incidental CT finding: Aortic and coronary atherosclerotic calcification incidentally noted. ABDOMEN/PELVIS Prostate: No focal activity in the prostate bed. Lymph nodes: No abnormal radiotracer accumulation within pelvic or abdominal nodes. Liver: No evidence of liver metastasis. Incidental CT finding: Stable moderately  enlarged prostate gland, distended urinary bladder increased moderate bilateral hydroureteronephrosis to the level of the urinary bladder. These findings are consistent with chronic bladder outlet obstruction and vesicoureteral reflux. SKELETON Several bone metastases with intense radiotracer accumulation are seen in the left scapula, thoracic spine and several bilateral ribs. IMPRESSION: Several radiotracer avid bone metastases in the left scapula, thoracic spine, and bilateral ribs. No evidence of  soft tissue metastatic disease. Stable enlarged prostate, with findings of chronic bladder outlet obstruction and vesicoureteral reflux. Aortic Atherosclerosis (ICD10-I70.0). Electronically Signed   By: Marlaine Hind M.D.   On: 06/15/2022 08:48

## 2022-07-05 NOTE — Assessment & Plan Note (Signed)
Discussed about neutropenia precaution.  Patient has received G-CSF

## 2022-07-05 NOTE — Assessment & Plan Note (Addendum)
Prostate cancer, Gleason score 10, s/p RT[2022]- biochemical recurrence 06/30/2021 Currently on ADT with Orgovyx 120 mg daily, Xtandi '160mg'$  daily.  PSA has increased to 9, repeat PSA further increased to 21.  PMSA showed disease progression with Several radiotracer avid bone metastases in the left scapula, thoracic spine, and bilateral ribs, no soft tissue metastatic lesion.  Recommend Docetaxel Q3weeks x 6 cycles with D3 GCSF Labs are reviewed and discussed with patient and wife.  S/p Docetaxel '70mg'$ /m2 with GCSF support.  Overall he tolerates well.  Continue Orgovyx '120mg'$  daily

## 2022-07-05 NOTE — Progress Notes (Signed)
Pt here for follow up. Pt currently not taking Xtandi.

## 2022-07-05 NOTE — Assessment & Plan Note (Signed)
On insulin, recommend patient to follow up with endocrinology/PCP

## 2022-07-05 NOTE — Assessment & Plan Note (Signed)
Encourage oral hydration and avoid nephrotoxins.   

## 2022-07-06 ENCOUNTER — Other Ambulatory Visit: Payer: Self-pay

## 2022-07-13 ENCOUNTER — Inpatient Hospital Stay: Payer: Medicare HMO

## 2022-07-17 ENCOUNTER — Other Ambulatory Visit: Payer: Self-pay

## 2022-07-17 MED ORDER — ORGOVYX 120 MG PO TABS: 120.0000 mg | ORAL_TABLET | Freq: Every day | ORAL | 0 refills | Status: DC

## 2022-07-18 MED FILL — Dexamethasone Sodium Phosphate Inj 100 MG/10ML: INTRAMUSCULAR | Qty: 1 | Status: AC

## 2022-07-19 ENCOUNTER — Inpatient Hospital Stay: Payer: Medicare HMO | Attending: Oncology

## 2022-07-19 ENCOUNTER — Inpatient Hospital Stay (HOSPITAL_BASED_OUTPATIENT_CLINIC_OR_DEPARTMENT_OTHER): Payer: Medicare HMO | Admitting: Oncology

## 2022-07-19 ENCOUNTER — Encounter: Payer: Self-pay | Admitting: Oncology

## 2022-07-19 ENCOUNTER — Inpatient Hospital Stay: Payer: Medicare HMO

## 2022-07-19 VITALS — BP 120/58 | HR 83 | Temp 97.8°F | Resp 18 | Wt 237.7 lb

## 2022-07-19 DIAGNOSIS — D701 Agranulocytosis secondary to cancer chemotherapy: Secondary | ICD-10-CM | POA: Diagnosis not present

## 2022-07-19 DIAGNOSIS — T451X5A Adverse effect of antineoplastic and immunosuppressive drugs, initial encounter: Secondary | ICD-10-CM

## 2022-07-19 DIAGNOSIS — Z5111 Encounter for antineoplastic chemotherapy: Secondary | ICD-10-CM

## 2022-07-19 DIAGNOSIS — C61 Malignant neoplasm of prostate: Secondary | ICD-10-CM

## 2022-07-19 DIAGNOSIS — C7951 Secondary malignant neoplasm of bone: Secondary | ICD-10-CM | POA: Insufficient documentation

## 2022-07-19 DIAGNOSIS — Z5189 Encounter for other specified aftercare: Secondary | ICD-10-CM | POA: Insufficient documentation

## 2022-07-19 DIAGNOSIS — N1832 Chronic kidney disease, stage 3b: Secondary | ICD-10-CM

## 2022-07-19 LAB — COMPREHENSIVE METABOLIC PANEL
ALT: 18 U/L (ref 0–44)
AST: 21 U/L (ref 15–41)
Albumin: 3.8 g/dL (ref 3.5–5.0)
Alkaline Phosphatase: 86 U/L (ref 38–126)
Anion gap: 7 (ref 5–15)
BUN: 28 mg/dL — ABNORMAL HIGH (ref 8–23)
CO2: 27 mmol/L (ref 22–32)
Calcium: 8.9 mg/dL (ref 8.9–10.3)
Chloride: 104 mmol/L (ref 98–111)
Creatinine, Ser: 1.58 mg/dL — ABNORMAL HIGH (ref 0.61–1.24)
GFR, Estimated: 44 mL/min — ABNORMAL LOW (ref >60)
Glucose, Bld: 194 mg/dL — ABNORMAL HIGH (ref 70–99)
Potassium: 4.1 mmol/L (ref 3.5–5.1)
Sodium: 138 mmol/L (ref 135–145)
Total Bilirubin: 0.5 mg/dL (ref 0.3–1.2)
Total Protein: 6.6 g/dL (ref 6.5–8.1)

## 2022-07-19 LAB — CBC WITH DIFFERENTIAL/PLATELET
Abs Immature Granulocytes: 0.04 10*3/uL (ref 0.00–0.07)
Basophils Absolute: 0 10*3/uL (ref 0.0–0.1)
Basophils Relative: 1 %
Eosinophils Absolute: 0 10*3/uL (ref 0.0–0.5)
Eosinophils Relative: 0 %
HCT: 36.1 % — ABNORMAL LOW (ref 39.0–52.0)
Hemoglobin: 11.9 g/dL — ABNORMAL LOW (ref 13.0–17.0)
Immature Granulocytes: 1 %
Lymphocytes Relative: 9 %
Lymphs Abs: 0.5 10*3/uL — ABNORMAL LOW (ref 0.7–4.0)
MCH: 28.6 pg (ref 26.0–34.0)
MCHC: 33 g/dL (ref 30.0–36.0)
MCV: 86.8 fL (ref 80.0–100.0)
Monocytes Absolute: 0.7 10*3/uL (ref 0.1–1.0)
Monocytes Relative: 13 %
Neutro Abs: 4 10*3/uL (ref 1.7–7.7)
Neutrophils Relative %: 76 %
Platelets: 305 10*3/uL (ref 150–400)
RBC: 4.16 MIL/uL — ABNORMAL LOW (ref 4.22–5.81)
RDW: 13.5 % (ref 11.5–15.5)
WBC: 5.3 10*3/uL (ref 4.0–10.5)
nRBC: 0 % (ref 0.0–0.2)

## 2022-07-19 LAB — PSA: Prostatic Specific Antigen: 10.79 ng/mL — ABNORMAL HIGH (ref 0.00–4.00)

## 2022-07-19 MED ORDER — SODIUM CHLORIDE 0.9 % IV SOLN
75.0000 mg/m2 | Freq: Once | INTRAVENOUS | Status: AC
  Administered 2022-07-19: 180 mg via INTRAVENOUS
  Filled 2022-07-19: qty 18

## 2022-07-19 MED ORDER — SODIUM CHLORIDE 0.9 % IV SOLN
10.0000 mg | Freq: Once | INTRAVENOUS | Status: AC
  Administered 2022-07-19: 10 mg via INTRAVENOUS
  Filled 2022-07-19: qty 10

## 2022-07-19 MED ORDER — SODIUM CHLORIDE 0.9 % IV SOLN
Freq: Once | INTRAVENOUS | Status: AC
  Filled 2022-07-19: qty 250

## 2022-07-19 MED ORDER — ORGOVYX 120 MG PO TABS: 120.0000 mg | ORAL_TABLET | Freq: Every day | ORAL | 3 refills | Status: DC

## 2022-07-19 MED ORDER — SODIUM CHLORIDE 0.9% FLUSH
10.0000 mL | INTRAVENOUS | Status: DC | PRN
  Administered 2022-07-19: 10 mL
  Filled 2022-07-19: qty 10

## 2022-07-19 NOTE — Progress Notes (Signed)
Hematology/Oncology Progress note [phone] Fax:([phone]) [phone]      CHIEF COMPLAINTS/REASON FOR VISIT:  Metastatic prostate cancer  ASSESSMENT & PLAN:   Cancer Staging  Prostate cancer [facility]) Staging form: Prostate, AJCC 8th Edition - Clinical: Stage Unknown (rcTX, cNX) - Signed by Earlie Server, MD on 07/13/2021 - Pathologic stage from 06/16/2022: Stage IVB (pNX, cM1b, Grade Group: 5) - Signed by Earlie Server, MD on 06/16/2022   Prostate cancer [facility]) Prostate cancer, Gleason score 10, s/p RT[2022]- biochemical recurrence 06/30/2021 Currently on ADT with Orgovyx 120 mg daily, Xtandi '160mg'$  daily.  PSA has increased to 9, repeat PSA further increased to 21.  PMSA showed disease progression with Several radiotracer avid bone metastases in the left scapula, thoracic spine, and bilateral ribs, no soft tissue metastatic lesion.  Recommend Docetaxel Q3weeks x 6 cycles with D3 GCSF Labs are reviewed and discussed with patient and wife.  Proceed with  Docetaxel '75mg'$ /m2 with GCSF support.   Continue Orgovyx '120mg'$  daily  Chemotherapy induced neutropenia ([place]) Patient gets G-CSF on D3  Encounter for antineoplastic chemotherapy Treatment as listed above.   Stage 3b chronic kidney disease ([place]) Encourage oral hydration and avoid nephrotoxins.     Orders Placed This Encounter  Procedures   PSA    Standing Status:   Future    Standing Expiration Date:   09/20/2023   CBC with Differential    Standing Status:   Future    Standing Expiration Date:   09/20/2023   Comprehensive metabolic panel    Standing Status:   Future    Standing Expiration Date:   09/20/2023   PSA    Standing Status:   Future    Standing Expiration Date:   10/11/2023   CBC with Differential    Standing Status:   Future    Standing Expiration Date:   10/11/2023   Comprehensive metabolic panel    Standing Status:   Future    Standing Expiration Date:   10/11/2023    Follow up  3 weeks lab MD Docetaxel D3 GCSF  All  questions were answered. The patient knows to call the clinic with any problems, questions or concerns.  Earlie Server, MD, PhD [facility] Hematology Oncology 07/19/2022   HISTORY OF PRESENTING ILLNESS:   Keith Howe is a  82 y.o.  male presents for follow up for prostate cancer Oncology History  Prostate cancer ([place])  02/24/2020 Tumor Marker   PSA at Dr..Wolff's office 5.7.Marland Kitchen   03/17/2020 Imaging    MRI of the prostate showed PI-RADS category 5 lesion of the left posterolateral and posteromedial peripheral zone in the base, mid gland, and apex, with involvement of the left central zone at the base. This lesion bulges the prosthetic capsular margin adjacent to the rectum, and there is a high suspicion for extraprostatic spread and early seminal vesicle invasion and likely left neurovascular bundle involvement. PI-RADS category 2 lesion in the right peripheral zone,considered PI-RADS category 2 given the nonfocal nature.. Encapsulated nodularity in the transition zone compatible with benign prostatic hypertrophy. Prostate volume 71.50 cubic cm.    04/08/2020 Initial Diagnosis   Prostate cancer ([place]) prostate cancer history dated back to 04/08/20.  Prostate biopsy showed prostate adenocarcinoma, 12 out of 12 cores involved.  Gleason score 10 [5+5] 2 cores, Gleason 9 [5+4] 1 core, Gleason 9 [4+5] involving 8 cores.     05/13/2020 Imaging   CT abdomen pelvis without contrast showed moderately enlarged prostate, no evidence of abdominal or pelvic metastatic  disease.    05/13/2020 Imaging   bone scan whole body showed no evidence of osseous metastasis    06/04/2020 -  Radiation Therapy   patient was seen by Dr. Baruch Gouty and underwent IMRT to prostate and pelvic nodes.  Dr. Baruch Gouty asked Dr. Yves Dill to give adjuvant deprivation therapy.    12/29/2020 Tumor Marker   PSA 0.07   06/22/2021 -  Chemotherapy   Orgovyx '120mg'$  daily Xtandi '160mg'$  daily   07/01/2021 Tumor Marker   PSA 4.3 Patient was  referred by Dr. Baruch Gouty to reestablish care with oncology for evaluation.  He was previously seen by me in the hematology clinic for abnormal SPEP and lost follow-up.  Oncology was not involved previously for treatment of prostate cancer.    07/13/2021 Cancer Staging   Staging form: Prostate, AJCC 8th Edition - Clinical: Stage Unknown (rcTX, cNX) - Signed by Earlie Server, MD on 07/13/2021 Stage prefix: Recurrence   10/04/2021 Tumor Marker   PSA at Urologist office  <0.1   01/02/2022 Tumor Marker   PSA at urologist office 0.5   05/26/2022 Tumor Marker   PSA 9.59  Previous prostate cancer treatment was with Dr. Yves Dill.  05/26/2022  Patient presented to reestablish care as Dr. Yves Dill retired   06/15/2022 Imaging   PMSA showed Several radiotracer avid bone metastases in the left scapula, thoracic spine, and bilateral ribs. No evidence of soft tissue metastatic disease. Stable enlarged prostate, with findings of chronic bladder outlet obstruction and vesicoureteral reflux. Aortic Atherosclerosis     06/16/2022 Cancer Staging   Staging form: Prostate, AJCC 8th Edition - Pathologic stage from 06/16/2022: Stage IVB (pNX, cM1b, Grade Group: 5) - Signed by Earlie Server, MD on 06/16/2022 Histologic grading system: 5 grade system   06/16/2022 Tumor Marker   PSA  21.45   06/28/2022 -  Chemotherapy   Patient is on Treatment Plan : PROSTATE Docetaxel (67)       INTERVAL HISTORY Keith Howe is a 82 y.o. male who has above history reviewed by me today presents for follow up visit for prostate cancer. Patient has decided to proceed with chemotherapy and has had chemotherapy class.  Accompanied by wife today.  takes Orgovyx 120 mg daily He tolerates chemotherapy well, mild nausea, he takes antiemetics with good symptom control no vomiting.    Review of Systems  Constitutional:  Negative for appetite change, chills, fatigue, fever and unexpected weight change.  HENT:   Negative for hearing loss and voice  change.   Eyes:  Negative for eye problems and icterus.  Respiratory:  Negative for chest tightness, cough and shortness of breath.   Cardiovascular:  Negative for chest pain and leg swelling.  Gastrointestinal:  Negative for abdominal distention and abdominal pain.  Endocrine: Negative for hot flashes.  Genitourinary:  Negative for difficulty urinating, dysuria and frequency.   Musculoskeletal:  Negative for arthralgias.  Skin:  Negative for itching and rash.  Neurological:  Negative for light-headedness and numbness.  Hematological:  Negative for adenopathy. Does not bruise/bleed easily.  Psychiatric/Behavioral:  Negative for confusion.     MEDICAL HISTORY:  Past Medical History:  Diagnosis Date   BPH (benign prostatic hyperplasia)    Cancer (HCC)    Chronic kidney disease    STAGE 3   Diabetes mellitus without complication (HCC)    HOH (hard of hearing)    AIDS   Hypertension    Inguinal hernia    Pain    CHRONIC LBP   Prostate cancer (  [place])     SURGICAL HISTORY: Past Surgical History:  Procedure Laterality Date   BACK SURGERY     Lumbar   CATARACT EXTRACTION W/PHACO Right 07/10/2017   Procedure: CATARACT EXTRACTION PHACO AND INTRAOCULAR LENS PLACEMENT (IOC);  Surgeon: Birder Robson, MD;  Location: [facility] ORS;  Service: Ophthalmology;  Laterality: Right;  [place] 00:29.2 AP% 16.5 CDE 4.83 Fluid Pack Lot # [id number]   COLONOSCOPY     EYE SURGERY Bilateral    cataract extraction   HERNIA REPAIR     INGUINAL   PROSTATE BIOPSY N/A 04/08/2020   Procedure: PROSTATE BIOPSY [name];  Surgeon: Royston Cowper, MD;  Location: [facility] ORS;  Service: Urology;  Laterality: N/A;   TOTAL KNEE ARTHROPLASTY Left 03/19/2018   Procedure: TOTAL KNEE ARTHROPLASTY;  Surgeon: Thornton Park, MD;  Location: [facility] ORS;  Service: Orthopedics;  Laterality: Left;    SOCIAL HISTORY: Social History   Socioeconomic History   Marital status: Married    Spouse name: Not on file   Number of  children: Not on file   Years of education: Not on file   Highest education level: Some college, no degree  Occupational History   Not on file  Tobacco Use   Smoking status: Never   Smokeless tobacco: Never  Vaping Use   Vaping Use: Never used  Substance and Sexual Activity   Alcohol use: No   Drug use: Never   Sexual activity: Not Currently  Other Topics Concern   Not on file  Social History Narrative   Independent at baseline.  Lives at home with his wife   Social Determinants of Health   Financial Resource Strain: Low Risk  (05/12/2021)   Overall Financial Resource Strain (CARDIA)    Difficulty of Paying Living Expenses: Not hard at all  Food Insecurity: No Food Insecurity (05/12/2021)   Hunger Vital Sign    Worried About Running Out of Food in the Last Year: Never true    Ran Out of Food in the Last Year: Never true  Transportation Needs: No Transportation Needs (05/12/2021)   PRAPARE - Hydrologist (Medical): No    Lack of Transportation (Non-Medical): No  Physical Activity: Insufficiently Active (05/12/2021)   Exercise Vital Sign    Days of Exercise per Week: 7 days    Minutes of Exercise per Session: 10 min  Stress: No Stress Concern Present (05/12/2021)   [place]    Feeling of Stress : Not at all  Social Connections: [place] (05/12/2021)   Social Connection and Isolation Panel [NHANES]    Frequency of Communication with Friends and Family: More than three times a week    Frequency of Social Gatherings with Friends and Family: Once a week    Attends Religious Services: More than 4 times per year    Active Member of [employer] or Organizations: Yes    Attends Music therapist: More than 4 times per year    Marital Status: Married  Human resources officer Violence: Not At Risk (05/12/2021)   Humiliation, Afraid, Rape, and Kick questionnaire    Fear of Current or  Ex-Partner: No    Emotionally Abused: No    Physically Abused: No    Sexually Abused: No    FAMILY HISTORY: Family History  Problem Relation Age of Onset   Diabetes Father    Breast cancer Sister     ALLERGIES:  has No Known Allergies.  MEDICATIONS:  Current Outpatient Medications  Medication Sig Dispense Refill   amLODipine (NORVASC) 5 MG tablet Take 5 mg by mouth daily.      aspirin EC 81 MG tablet Take 81 mg by mouth daily.     cholecalciferol (VITAMIN D) 1000 units tablet Take 1,000 Units by mouth daily.     dapagliflozin propanediol (FARXIGA) 10 MG TABS tablet Take by mouth daily.     dexamethasone (DECADRON) 4 MG tablet Take 2 tabs by mouth daily starting the day before chemo. Then take 2 tabs daily for 2 days starting day after chemo. Take with food. 36 tablet 1   dutasteride (AVODART) 0.5 MG capsule Take 0.5 mg by mouth daily.  3   losartan (COZAAR) 100 MG tablet Take 100 mg by mouth daily.     lovastatin (MEVACOR) 40 MG tablet TAKE 1 TABLET BY MOUTH EVERY DAY 90 tablet 3   metoprolol tartrate (LOPRESSOR) 50 MG tablet TAKE 1 TABLET BY MOUTH EVERY 12 HOURS 180 tablet 2   Misc Natural Products (PROSTATE SUPPORT PO) Take 2 capsules by mouth daily.     NOVOLOG FLEXPEN 100 UNIT/ML FlexPen Inject 7 Units into the skin 3 (three) times daily before meals.  0   Omega-3 Fatty Acids (FISH OIL PO) Take 1 capsule by mouth daily.      ondansetron (ZOFRAN) 8 MG tablet Take 1 tablet (8 mg total) by mouth every 8 (eight) hours as needed for nausea or vomiting. 30 tablet 1   prochlorperazine (COMPAZINE) 10 MG tablet Take 1 tablet (10 mg total) by mouth every 6 (six) hours as needed for nausea or vomiting. 30 tablet 1   relugolix (ORGOVYX) 120 MG tablet Take 1 tablet (120 mg total) by mouth daily. 30 tablet 0   TRESIBA FLEXTOUCH 200 UNIT/ML SOPN Inject 80 Units into the muscle daily before breakfast.   2   vitamin B-12 (CYANOCOBALAMIN) 500 MCG tablet Take 500 mcg by mouth daily.      enzalutamide (XTANDI) 40 MG capsule Take 160 mg by mouth daily. (Patient not taking: Reported on [id number])     TRULICITY 1.5 0000000 SOPN Inject 1.5 mg into the muscle every Wednesday.  (Patient not taking: Reported on 07/19/2022)  1   No current facility-administered medications for this visit.     PHYSICAL EXAMINATION: ECOG PERFORMANCE STATUS: 1 - Symptomatic but completely ambulatory Vitals:   07/19/22 0851  BP: (!) 120/58  Pulse: 83  Resp: 18  Temp: 97.8 F (36.6 C)   Filed Weights   07/19/22 0851  Weight: 237 lb 11.2 oz (107.8 kg)    Physical Exam Constitutional:      General: He is not in acute distress. HENT:     Head: Normocephalic and atraumatic.  Eyes:     General: No scleral icterus. Cardiovascular:     Rate and Rhythm: Normal rate and regular rhythm.     Heart sounds: Normal heart sounds.  Pulmonary:     Effort: Pulmonary effort is normal. No respiratory distress.     Breath sounds: No wheezing.  Abdominal:     General: Bowel sounds are normal. There is no distension.     Palpations: Abdomen is soft.  Musculoskeletal:        General: No deformity. Normal range of motion.     Cervical back: Normal range of motion and neck supple.  Skin:    General: Skin is warm and dry.     Findings: No erythema or rash.  Neurological:  Mental Status: He is alert and oriented to person, place, and time. Mental status is at baseline.     Cranial Nerves: No cranial nerve deficit.     Coordination: Coordination normal.  Psychiatric:        Mood and Affect: Mood normal.     LABORATORY DATA:  I have reviewed the data as listed    Latest Ref Rng & Units 07/19/2022    8:28 AM 07/05/2022    9:11 AM 06/27/2022    9:01 AM  CBC  WBC 4.0 - 10.5 K/uL 5.3  1.5  4.0   Hemoglobin 13.0 - 17.0 g/dL 11.9  11.8  12.8   Hematocrit 39.0 - 52.0 % 36.1  35.3  39.5   Platelets 150 - 400 K/uL 305  160  180       Latest Ref Rng & Units 07/19/2022    8:28 AM 07/05/2022    9:11 AM  06/27/2022    9:01 AM  CMP  Glucose 70 - 99 mg/dL 194  84  77   BUN 8 - 23 mg/dL '28  27  30   '$ Creatinine 0.61 - 1.24 mg/dL 1.58  1.65  1.67   Sodium 135 - 145 mmol/L 138  138  141   Potassium 3.5 - 5.1 mmol/L 4.1  3.8  4.0   Chloride 98 - 111 mmol/L 104  102  104   CO2 22 - 32 mmol/L '27  25  27   '$ Calcium 8.9 - 10.3 mg/dL 8.9  9.0  9.6   Total Protein 6.5 - 8.1 g/dL 6.6  7.0  7.4   Total Bilirubin 0.3 - 1.2 mg/dL 0.5  0.6  0.7   Alkaline Phos 38 - 126 U/L 86  54  56   AST 15 - 41 U/L '21  17  18   '$ ALT 0 - 44 U/L '18  13  12     '$ RADIOGRAPHIC STUDIES: I have personally reviewed the radiological images as listed and agreed with the findings in the report. No results found.

## 2022-07-19 NOTE — Assessment & Plan Note (Signed)
Encourage oral hydration and avoid nephrotoxins.   

## 2022-07-19 NOTE — Assessment & Plan Note (Signed)
Patient gets G-CSF on D3

## 2022-07-19 NOTE — Assessment & Plan Note (Signed)
Treatment as listed above 

## 2022-07-19 NOTE — Patient Instructions (Signed)
[facility]  Discharge Instructions: Thank you for choosing [facility] to provide your oncology and hematology care.  If you have a lab appointment with the Cancer Center, please go directly to the Cancer Center and check in at the registration area.  Wear comfortable clothing and clothing appropriate for easy access to any Portacath or PICC line.   We strive to give you quality time with your provider. You may need to reschedule your appointment if you arrive late (15 or more minutes).  Arriving late affects you and other patients whose appointments are after yours.  Also, if you miss three or more appointments without notifying the office, you may be dismissed from the clinic at the provider's discretion.      For prescription refill requests, have your pharmacy contact our office and allow 72 hours for refills to be completed.    Today you received the following chemotherapy and/or immunotherapy agents: DOCEtaxel    To help prevent nausea and vomiting after your treatment, we encourage you to take your nausea medication as directed.  BELOW ARE SYMPTOMS THAT SHOULD BE REPORTED IMMEDIATELY: *FEVER GREATER THAN 100.4 F (38 C) OR HIGHER *CHILLS OR SWEATING *NAUSEA AND VOMITING THAT IS NOT CONTROLLED WITH YOUR NAUSEA MEDICATION *UNUSUAL SHORTNESS OF BREATH *UNUSUAL BRUISING OR BLEEDING *URINARY PROBLEMS (pain or burning when urinating, or frequent urination) *BOWEL PROBLEMS (unusual diarrhea, constipation, pain near the anus) TENDERNESS IN MOUTH AND THROAT WITH OR WITHOUT PRESENCE OF ULCERS (sore throat, sores in mouth, or a toothache) UNUSUAL RASH, SWELLING OR PAIN  UNUSUAL VAGINAL DISCHARGE OR ITCHING   Items with * indicate a potential emergency and should be followed up as soon as possible or go to the Emergency Department if any problems should occur.  Please show the CHEMOTHERAPY ALERT CARD or IMMUNOTHERAPY ALERT CARD at check-in to  the Emergency Department and triage nurse.  Should you have questions after your visit or need to cancel or reschedule your appointment, please contact [facility]  [phone] and follow the prompts.  Office hours are 8:00 a.m. to 4:30 p.m. Monday - Friday. Please note that voicemails left after 4:00 p.m. may not be returned until the following business day.  We are closed weekends and major holidays. You have access to a nurse at all times for urgent questions. Please call the main number to the clinic [phone] and follow the prompts.  For any non-urgent questions, you may also contact your provider using MyChart. We now offer e-Visits for anyone 18 and older to request care online for non-urgent symptoms. For details visit [email or web address].   Also download the MyChart app! Go to the app store, search "MyChart", open the app, select [facility], and log in with your MyChart username and password.    

## 2022-07-19 NOTE — Assessment & Plan Note (Signed)
Prostate cancer, Gleason score 10, s/p RT[2022]- biochemical recurrence 06/30/2021 Currently on ADT with Orgovyx 120 mg daily, Xtandi '160mg'$  daily.  PSA has increased to 9, repeat PSA further increased to 21.  PMSA showed disease progression with Several radiotracer avid bone metastases in the left scapula, thoracic spine, and bilateral ribs, no soft tissue metastatic lesion.  Recommend Docetaxel Q3weeks x 6 cycles with D3 GCSF Labs are reviewed and discussed with patient and wife.  Proceed with  Docetaxel '75mg'$ /m2 with GCSF support.   Continue Orgovyx '120mg'$  daily

## 2022-07-20 ENCOUNTER — Ambulatory Visit
Admission: RE | Admit: 2022-07-20 | Discharge: 2022-07-20 | Disposition: A | Discharge status: Home / Self Care | Payer: Medicare HMO | Source: Ambulatory Visit | Attending: Radiation Oncology | Admitting: Radiation Oncology

## 2022-07-20 ENCOUNTER — Encounter: Payer: Self-pay | Admitting: Radiation Oncology

## 2022-07-20 VITALS — BP 137/70 | HR 89 | Temp 96.7°F | Resp 18 | Ht 74.0 in | Wt 237.9 lb

## 2022-07-20 DIAGNOSIS — Z923 Personal history of irradiation: Secondary | ICD-10-CM | POA: Insufficient documentation

## 2022-07-20 DIAGNOSIS — C7951 Secondary malignant neoplasm of bone: Secondary | ICD-10-CM | POA: Diagnosis not present

## 2022-07-20 DIAGNOSIS — C775 Secondary and unspecified malignant neoplasm of intrapelvic lymph nodes: Secondary | ICD-10-CM | POA: Diagnosis not present

## 2022-07-20 DIAGNOSIS — C61 Malignant neoplasm of prostate: Secondary | ICD-10-CM | POA: Insufficient documentation

## 2022-07-20 NOTE — Progress Notes (Signed)
Radiation Oncology Follow up Note  Name: Keith Howe   Date:   07/20/2022 MRN:  [id number] DOB: 11-Jun-1940    This 82 y.o. male presents to the clinic today for 38-monthfollow-up status post IMRT T radiation therapy to his prostate and pelvic nodes for Gleason 10 (5+5) presenting with a PSA of 5.7.  REFERRING PROVIDER: MCletis Athens MD  HPI: Patient is an 82year old male 81 now out 22 months having completed IMRT radiation therapy to his prostate and pelvic nodes for a Gleason 10 adenocarcinoma..  He has gone on to develop progression of disease with most recent PSA up to 10.7.  He has been on oral ADT medication with Orgovyx and Xtandi and is being considered for switching to Eligard.  His PSMA PET scan shows radiotracer avid bone metastasis in the left scapula thoracic spine and ribs.  He is in no pain at this time.  He is also being considered for Taxol treatment.  He specifically denies any increased lower urinary tract symptoms diarrhea or fatigue.  COMPLICATIONS OF TREATMENT: none  FOLLOW UP COMPLIANCE: keeps appointments   PHYSICAL EXAM:  BP 137/70   Pulse 89   Temp (!) 96.7 F (35.9 C)   Resp 18   Ht '6\' 2"'$  (1.88 m)   Wt 237 lb 14.4 oz (107.9 kg)   SpO2 100%   BMI 30.54 kg/m  Well-developed well-nourished patient in NAD. HEENT reveals PERLA, EOMI, discs not visualized.  Oral cavity is clear. No oral mucosal lesions are identified. Neck is clear without evidence of cervical or supraclavicular adenopathy. Lungs are clear to A&P. Cardiac examination is essentially unremarkable with regular rate and rhythm without murmur rub or thrill. Abdomen is benign with no organomegaly or masses noted. Motor sensory and DTR levels are equal and symmetric in the upper and lower extremities. Cranial nerves II through XII are grossly intact. Proprioception is intact. No peripheral adenopathy or edema is identified. No motor or sensory levels are noted. Crude visual fields are within normal  range.  RADIOLOGY RESULTS: PSMA PET scan reviewed.  PLAN: At this time I will turn follow-up care over to medical oncology.  Should he develop pain in any any these areas of bone metastasis especially the scapula or 6 thoracic spine would offer palliative radiation therapy at that time.  Patient is to call with any concerns.  I would like to take this opportunity to thank you for allowing me to participate in the care of your patient..Noreene Filbert MD

## 2022-07-21 ENCOUNTER — Inpatient Hospital Stay: Payer: Medicare HMO

## 2022-07-21 VITALS — BP 130/66 | HR 89 | Resp 18

## 2022-07-21 DIAGNOSIS — C61 Malignant neoplasm of prostate: Secondary | ICD-10-CM

## 2022-07-21 DIAGNOSIS — Z5111 Encounter for antineoplastic chemotherapy: Secondary | ICD-10-CM | POA: Diagnosis not present

## 2022-07-21 MED ORDER — PEGFILGRASTIM-JMDB 6 MG/0.6ML ~~LOC~~ SOSY
6.0000 mg | PREFILLED_SYRINGE | Freq: Once | SUBCUTANEOUS | Status: AC
  Administered 2022-07-21: 6 mg via SUBCUTANEOUS
  Filled 2022-07-21: qty 0.6

## 2022-07-31 ENCOUNTER — Observation Stay: Payer: Medicare HMO

## 2022-07-31 ENCOUNTER — Other Ambulatory Visit: Payer: Self-pay

## 2022-07-31 ENCOUNTER — Inpatient Hospital Stay
Admission: EM | Admit: 2022-07-31 | Discharge: 2022-08-02 | DRG: 812 | Disposition: A | Discharge status: Home Health | Payer: Medicare HMO | Attending: Internal Medicine | Admitting: Internal Medicine

## 2022-07-31 ENCOUNTER — Emergency Department: Payer: Medicare HMO

## 2022-07-31 DIAGNOSIS — Z6831 Body mass index (BMI) 31.0-31.9, adult: Secondary | ICD-10-CM

## 2022-07-31 DIAGNOSIS — E669 Obesity, unspecified: Secondary | ICD-10-CM | POA: Diagnosis present

## 2022-07-31 DIAGNOSIS — Z794 Long term (current) use of insulin: Secondary | ICD-10-CM | POA: Diagnosis not present

## 2022-07-31 DIAGNOSIS — E1129 Type 2 diabetes mellitus with other diabetic kidney complication: Secondary | ICD-10-CM | POA: Diagnosis present

## 2022-07-31 DIAGNOSIS — E11649 Type 2 diabetes mellitus with hypoglycemia without coma: Secondary | ICD-10-CM | POA: Diagnosis not present

## 2022-07-31 DIAGNOSIS — N183 Chronic kidney disease, stage 3 unspecified: Secondary | ICD-10-CM | POA: Diagnosis present

## 2022-07-31 DIAGNOSIS — Z7960 Long term (current) use of unspecified immunomodulators and immunosuppressants: Secondary | ICD-10-CM

## 2022-07-31 DIAGNOSIS — Z833 Family history of diabetes mellitus: Secondary | ICD-10-CM

## 2022-07-31 DIAGNOSIS — I5A Non-ischemic myocardial injury (non-traumatic): Secondary | ICD-10-CM | POA: Diagnosis present

## 2022-07-31 DIAGNOSIS — D649 Anemia, unspecified: Secondary | ICD-10-CM | POA: Diagnosis present

## 2022-07-31 DIAGNOSIS — Z7982 Long term (current) use of aspirin: Secondary | ICD-10-CM

## 2022-07-31 DIAGNOSIS — Z1152 Encounter for screening for COVID-19: Secondary | ICD-10-CM | POA: Diagnosis not present

## 2022-07-31 DIAGNOSIS — D6481 Anemia due to antineoplastic chemotherapy: Principal | ICD-10-CM | POA: Diagnosis present

## 2022-07-31 DIAGNOSIS — T451X5A Adverse effect of antineoplastic and immunosuppressive drugs, initial encounter: Secondary | ICD-10-CM | POA: Diagnosis present

## 2022-07-31 DIAGNOSIS — R6510 Systemic inflammatory response syndrome (SIRS) of non-infectious origin without acute organ dysfunction: Secondary | ICD-10-CM | POA: Diagnosis present

## 2022-07-31 DIAGNOSIS — Z96652 Presence of left artificial knee joint: Secondary | ICD-10-CM | POA: Diagnosis present

## 2022-07-31 DIAGNOSIS — R55 Syncope and collapse: Secondary | ICD-10-CM | POA: Diagnosis not present

## 2022-07-31 DIAGNOSIS — E1165 Type 2 diabetes mellitus with hyperglycemia: Secondary | ICD-10-CM | POA: Diagnosis present

## 2022-07-31 DIAGNOSIS — R600 Localized edema: Secondary | ICD-10-CM | POA: Diagnosis present

## 2022-07-31 DIAGNOSIS — Z79890 Hormone replacement therapy (postmenopausal): Secondary | ICD-10-CM

## 2022-07-31 DIAGNOSIS — R569 Unspecified convulsions: Secondary | ICD-10-CM | POA: Diagnosis present

## 2022-07-31 DIAGNOSIS — Z79899 Other long term (current) drug therapy: Secondary | ICD-10-CM | POA: Diagnosis not present

## 2022-07-31 DIAGNOSIS — I129 Hypertensive chronic kidney disease with stage 1 through stage 4 chronic kidney disease, or unspecified chronic kidney disease: Secondary | ICD-10-CM | POA: Diagnosis present

## 2022-07-31 DIAGNOSIS — Z7952 Long term (current) use of systemic steroids: Secondary | ICD-10-CM | POA: Diagnosis not present

## 2022-07-31 DIAGNOSIS — C61 Malignant neoplasm of prostate: Secondary | ICD-10-CM | POA: Diagnosis present

## 2022-07-31 DIAGNOSIS — E1122 Type 2 diabetes mellitus with diabetic chronic kidney disease: Secondary | ICD-10-CM | POA: Diagnosis present

## 2022-07-31 DIAGNOSIS — I10 Essential (primary) hypertension: Secondary | ICD-10-CM | POA: Diagnosis present

## 2022-07-31 DIAGNOSIS — N179 Acute kidney failure, unspecified: Secondary | ICD-10-CM | POA: Diagnosis present

## 2022-07-31 DIAGNOSIS — Z803 Family history of malignant neoplasm of breast: Secondary | ICD-10-CM | POA: Diagnosis not present

## 2022-07-31 DIAGNOSIS — N1831 Chronic kidney disease, stage 3a: Secondary | ICD-10-CM | POA: Diagnosis present

## 2022-07-31 DIAGNOSIS — N1832 Chronic kidney disease, stage 3b: Secondary | ICD-10-CM | POA: Diagnosis present

## 2022-07-31 DIAGNOSIS — Z7984 Long term (current) use of oral hypoglycemic drugs: Secondary | ICD-10-CM

## 2022-07-31 LAB — GLUCOSE, CAPILLARY
Glucose-Capillary: 178 mg/dL — ABNORMAL HIGH (ref 70–99)
Glucose-Capillary: 157 mg/dL — ABNORMAL HIGH (ref 70–99)

## 2022-07-31 LAB — CBC WITH DIFFERENTIAL/PLATELET
Abs Immature Granulocytes: 2.17 10*3/uL — ABNORMAL HIGH (ref 0.00–0.07)
Basophils Absolute: 0.1 10*3/uL (ref 0.0–0.1)
Basophils Relative: 0 %
Eosinophils Absolute: 0 10*3/uL (ref 0.0–0.5)
Eosinophils Relative: 0 %
HCT: 23.1 % — ABNORMAL LOW (ref 39.0–52.0)
Hemoglobin: 7.8 g/dL — ABNORMAL LOW (ref 13.0–17.0)
Immature Granulocytes: 6 %
Lymphocytes Relative: 2 %
Lymphs Abs: 0.9 10*3/uL (ref 0.7–4.0)
MCH: 29.1 pg (ref 26.0–34.0)
MCHC: 33.8 g/dL (ref 30.0–36.0)
MCV: 86.2 fL (ref 80.0–100.0)
Monocytes Absolute: 1.8 10*3/uL — ABNORMAL HIGH (ref 0.1–1.0)
Monocytes Relative: 5 %
Neutro Abs: 31 10*3/uL — ABNORMAL HIGH (ref 1.7–7.7)
Neutrophils Relative %: 87 %
Platelets: 163 10*3/uL (ref 150–400)
RBC: 2.68 MIL/uL — ABNORMAL LOW (ref 4.22–5.81)
RDW: 14.3 % (ref 11.5–15.5)
Smear Review: NORMAL
WBC: 36 10*3/uL — ABNORMAL HIGH (ref 4.0–10.5)
nRBC: 0.6 % — ABNORMAL HIGH (ref 0.0–0.2)

## 2022-07-31 LAB — PROTIME-INR
INR: 1.2 (ref 0.8–1.2)
Prothrombin Time: 15.3 seconds — ABNORMAL HIGH (ref 11.4–15.2)

## 2022-07-31 LAB — CBC
HCT: 20.6 % — ABNORMAL LOW (ref 39.0–52.0)
HCT: 20.6 % — ABNORMAL LOW (ref 39.0–52.0)
Hemoglobin: 6.9 g/dL — ABNORMAL LOW (ref 13.0–17.0)
Hemoglobin: 6.9 g/dL — ABNORMAL LOW (ref 13.0–17.0)
MCH: 28.6 pg (ref 26.0–34.0)
MCH: 28.6 pg (ref 26.0–34.0)
MCHC: 33.5 g/dL (ref 30.0–36.0)
MCHC: 33.5 g/dL (ref 30.0–36.0)
MCV: 85.5 fL (ref 80.0–100.0)
MCV: 85.5 fL (ref 80.0–100.0)
Platelets: 144 10*3/uL — ABNORMAL LOW (ref 150–400)
Platelets: 145 10*3/uL — ABNORMAL LOW (ref 150–400)
RBC: 2.41 MIL/uL — ABNORMAL LOW (ref 4.22–5.81)
RBC: 2.41 MIL/uL — ABNORMAL LOW (ref 4.22–5.81)
RDW: 14.1 % (ref 11.5–15.5)
RDW: 14 % (ref 11.5–15.5)
WBC: 37.6 10*3/uL — ABNORMAL HIGH (ref 4.0–10.5)
WBC: 35.1 10*3/uL — ABNORMAL HIGH (ref 4.0–10.5)
nRBC: 0.6 % — ABNORMAL HIGH (ref 0.0–0.2)
nRBC: 0.7 % — ABNORMAL HIGH (ref 0.0–0.2)

## 2022-07-31 LAB — COMPREHENSIVE METABOLIC PANEL
ALT: 15 U/L (ref 0–44)
AST: 18 U/L (ref 15–41)
Albumin: 3.2 g/dL — ABNORMAL LOW (ref 3.5–5.0)
Alkaline Phosphatase: 113 U/L (ref 38–126)
Anion gap: 11 (ref 5–15)
BUN: 90 mg/dL — ABNORMAL HIGH (ref 8–23)
CO2: 22 mmol/L (ref 22–32)
Calcium: 8.6 mg/dL — ABNORMAL LOW (ref 8.9–10.3)
Chloride: 102 mmol/L (ref 98–111)
Creatinine, Ser: 2.51 mg/dL — ABNORMAL HIGH (ref 0.61–1.24)
GFR, Estimated: 25 mL/min — ABNORMAL LOW (ref >60)
Glucose, Bld: 221 mg/dL — ABNORMAL HIGH (ref 70–99)
Potassium: 4.1 mmol/L (ref 3.5–5.1)
Sodium: 135 mmol/L (ref 135–145)
Total Bilirubin: 0.5 mg/dL (ref 0.3–1.2)
Total Protein: 5.7 g/dL — ABNORMAL LOW (ref 6.5–8.1)

## 2022-07-31 LAB — IRON AND TIBC
Iron: 117 ug/dL (ref 45–182)
Saturation Ratios: 49 % — ABNORMAL HIGH (ref 17.9–39.5)
TIBC: 238 ug/dL — ABNORMAL LOW (ref 250–450)
UIBC: 121 ug/dL

## 2022-07-31 LAB — FOLATE: Folate: 13.1 ng/mL (ref >5.9)

## 2022-07-31 LAB — TROPONIN I (HIGH SENSITIVITY)
Troponin I (High Sensitivity): 61 ng/L — ABNORMAL HIGH (ref <18)
Troponin I (High Sensitivity): 67 ng/L — ABNORMAL HIGH (ref <18)
Troponin I (High Sensitivity): 84 ng/L — ABNORMAL HIGH (ref <18)
Troponin I (High Sensitivity): 80 ng/L — ABNORMAL HIGH (ref <18)
Troponin I (High Sensitivity): 82 ng/L — ABNORMAL HIGH (ref <18)

## 2022-07-31 LAB — SARS CORONAVIRUS 2 BY RT PCR: SARS Coronavirus 2 by RT PCR: NEGATIVE

## 2022-07-31 LAB — LIPID PANEL
Cholesterol: 101 mg/dL (ref 0–200)
HDL: 15 mg/dL — ABNORMAL LOW (ref >40)
LDL Cholesterol: 43 mg/dL (ref 0–99)
Total CHOL/HDL Ratio: 6.7 RATIO
Triglycerides: 217 mg/dL — ABNORMAL HIGH (ref <150)
VLDL: 43 mg/dL — ABNORMAL HIGH (ref 0–40)

## 2022-07-31 LAB — APTT: aPTT: 26 seconds (ref 24–36)

## 2022-07-31 LAB — RETICULOCYTES
Immature Retic Fract: 45.2 % — ABNORMAL HIGH (ref 2.3–15.9)
RBC.: 2.43 MIL/uL — ABNORMAL LOW (ref 4.22–5.81)
Retic Count, Absolute: 86.8 10*3/uL (ref 19.0–186.0)
Retic Ct Pct: 3.6 % — ABNORMAL HIGH (ref 0.4–3.1)

## 2022-07-31 LAB — PREPARE RBC (CROSSMATCH)

## 2022-07-31 LAB — PROCALCITONIN: Procalcitonin: 0.28 ng/mL

## 2022-07-31 LAB — BRAIN NATRIURETIC PEPTIDE: B Natriuretic Peptide: 271.1 pg/mL — ABNORMAL HIGH (ref 0.0–100.0)

## 2022-07-31 LAB — VITAMIN B12: Vitamin B-12: >7500 pg/mL — ABNORMAL HIGH (ref 180–914)

## 2022-07-31 LAB — LACTIC ACID, PLASMA: Lactic Acid, Venous: 3.5 mmol/L (ref 0.5–1.9)

## 2022-07-31 LAB — FERRITIN: Ferritin: 488 ng/mL — ABNORMAL HIGH (ref 24–336)

## 2022-07-31 MED ORDER — VANCOMYCIN HCL 500 MG/100ML IV SOLN
500.0000 mg | Freq: Once | INTRAVENOUS | Status: AC
  Administered 2022-07-31: 500 mg via INTRAVENOUS
  Filled 2022-07-31: qty 100

## 2022-07-31 MED ORDER — SODIUM CHLORIDE 0.9 % IV SOLN
2.0000 g | INTRAVENOUS | Status: DC
  Filled 2022-07-31: qty 12.5

## 2022-07-31 MED ORDER — INSULIN GLARGINE-YFGN 100 UNIT/ML ~~LOC~~ SOLN
55.0000 [IU] | Freq: Every day | SUBCUTANEOUS | Status: DC
  Filled 2022-07-31: qty 0.55

## 2022-07-31 MED ORDER — INSULIN ASPART 100 UNIT/ML IJ SOLN
0.0000 [IU] | Freq: Three times a day (TID) | INTRAMUSCULAR | Status: DC
  Administered 2022-07-31 – 2022-08-01 (×3): 2 [IU] via SUBCUTANEOUS
  Filled 2022-07-31 (×3): qty 1

## 2022-07-31 MED ORDER — OMEGA-3-ACID ETHYL ESTERS 1 G PO CAPS
1.0000 g | ORAL_CAPSULE | Freq: Every day | ORAL | Status: DC
  Administered 2022-07-31 – 2022-08-02 (×3): 1 g via ORAL
  Filled 2022-07-31 (×3): qty 1

## 2022-07-31 MED ORDER — METRONIDAZOLE 500 MG/100ML IV SOLN
500.0000 mg | Freq: Once | INTRAVENOUS | Status: AC
  Administered 2022-07-31: 500 mg via INTRAVENOUS
  Filled 2022-07-31: qty 100

## 2022-07-31 MED ORDER — LORATADINE 10 MG PO TABS: 10.0000 mg | ORAL_TABLET | Freq: Every day | ORAL | Status: DC | PRN

## 2022-07-31 MED ORDER — RELUGOLIX 120 MG PO TABS
120.0000 mg | ORAL_TABLET | Freq: Every day | ORAL | Status: DC
  Administered 2022-08-01 – 2022-08-02 (×2): 120 mg via ORAL
  Filled 2022-07-31 (×2): qty 1

## 2022-07-31 MED ORDER — HYDRALAZINE HCL 20 MG/ML IJ SOLN: 5.0000 mg | INTRAMUSCULAR | Status: DC | PRN

## 2022-07-31 MED ORDER — PANTOPRAZOLE SODIUM 40 MG IV SOLR: 40.0000 mg | Freq: Two times a day (BID) | INTRAVENOUS | Status: DC

## 2022-07-31 MED ORDER — LACTATED RINGERS IV BOLUS
1000.0000 mL | Freq: Once | INTRAVENOUS | Status: AC
  Administered 2022-07-31: 1000 mL via INTRAVENOUS

## 2022-07-31 MED ORDER — VANCOMYCIN HCL 2000 MG/400ML IV SOLN
2000.0000 mg | Freq: Once | INTRAVENOUS | Status: AC
  Administered 2022-07-31: 2000 mg via INTRAVENOUS
  Filled 2022-07-31 (×2): qty 400

## 2022-07-31 MED ORDER — CYANOCOBALAMIN 500 MCG PO TABS
500.0000 ug | ORAL_TABLET | Freq: Every day | ORAL | Status: DC
  Administered 2022-07-31 – 2022-08-02 (×3): 500 ug via ORAL
  Filled 2022-07-31 (×3): qty 1

## 2022-07-31 MED ORDER — SODIUM CHLORIDE 0.9% IV SOLUTION: Freq: Once | INTRAVENOUS | Status: AC

## 2022-07-31 MED ORDER — PRAVASTATIN SODIUM 20 MG PO TABS
40.0000 mg | ORAL_TABLET | Freq: Every day | ORAL | Status: DC
  Administered 2022-07-31 – 2022-08-01 (×2): 40 mg via ORAL
  Filled 2022-07-31 (×2): qty 2

## 2022-07-31 MED ORDER — VANCOMYCIN VARIABLE DOSE PER UNSTABLE RENAL FUNCTION (PHARMACIST DOSING)
Status: DC
  Filled 2022-07-31: qty 1

## 2022-07-31 MED ORDER — PANTOPRAZOLE SODIUM 40 MG IV SOLR
40.0000 mg | Freq: Once | INTRAVENOUS | Status: AC
  Administered 2022-07-31: 40 mg via INTRAVENOUS
  Filled 2022-07-31: qty 10

## 2022-07-31 MED ORDER — PANTOPRAZOLE SODIUM 40 MG IV SOLR: 40.0000 mg | Freq: Once | INTRAVENOUS | Status: DC

## 2022-07-31 MED ORDER — SODIUM CHLORIDE 0.9 % IV SOLN
2.0000 g | Freq: Once | INTRAVENOUS | Status: AC
  Administered 2022-07-31: 2 g via INTRAVENOUS
  Filled 2022-07-31: qty 12.5

## 2022-07-31 MED ORDER — VITAMIN D 25 MCG (1000 UNIT) PO TABS
1000.0000 [IU] | ORAL_TABLET | Freq: Every day | ORAL | Status: DC
  Administered 2022-07-31 – 2022-08-02 (×3): 1000 [IU] via ORAL
  Filled 2022-07-31 (×3): qty 1

## 2022-07-31 MED ORDER — INSULIN ASPART 100 UNIT/ML IJ SOLN: 0.0000 [IU] | Freq: Every day | INTRAMUSCULAR | Status: DC

## 2022-07-31 MED ORDER — ACETAMINOPHEN 325 MG PO TABS: 650.0000 mg | ORAL_TABLET | Freq: Four times a day (QID) | ORAL | Status: DC | PRN

## 2022-07-31 MED ORDER — VANCOMYCIN HCL IN DEXTROSE 1-5 GM/200ML-% IV SOLN
1000.0000 mg | Freq: Once | INTRAVENOUS | Status: DC
  Filled 2022-07-31: qty 200

## 2022-07-31 MED ORDER — ONDANSETRON HCL 4 MG/2ML IJ SOLN: 4.0000 mg | Freq: Three times a day (TID) | INTRAMUSCULAR | Status: DC | PRN

## 2022-07-31 NOTE — Consult Note (Signed)
PHARMACY -  BRIEF ANTIBIOTIC NOTE   Pharmacy has received consult(s) for cefepime and vancomycin dosing from an ED provider.  The patient's profile has been reviewed for ht/wt/allergies/indication/available labs.    One time order(s) placed for Cefepime 2 grams IV x 1 and vancomycin 2 grams IV x 1  Further antibiotics/pharmacy consults should be ordered by admitting physician if indicated.                       Thank you, Lorin Picket, PharmD 07/31/2022  2:23 PM

## 2022-07-31 NOTE — Consult Note (Signed)
CODE SEPSIS - PHARMACY COMMUNICATION  **Broad Spectrum Antibiotics should be administered within 1 hour of Sepsis diagnosis**  Time Code Sepsis Called/Page Received: 1412  Antibiotics Ordered: cefepime, vancomycin, and Flagyl  Time of 1st antibiotic administration: 1424  Additional action taken by pharmacy: [place] ,PharmD Clinical Pharmacist  07/31/2022  2:25 PM

## 2022-07-31 NOTE — H&P (Signed)
History and Physical    SETON ALDRIDGE [id number] DOB: 10-Mar-1941 DOA: 07/31/2022  Referring MD/NP/PA:   PCP: Earlie Server, MD   Patient coming from:  The patient is coming from home.   Chief Complaint: Near syncope  HPI: Keith Howe is a 82 y.o. male with medical history significant of prostate cancer on chemotherapy, hypertension, diabetes mellitus, hard of hearing, obesity, CKD-3B, who presents with near syncope.  Patient states that he had 2 episode of near syncope.  First episode happened Saturday when he was using bathroom.  He felt lightheaded, almost passed out, but did not have LOC.  It happened again yesterday.  Patient denies unilateral numbness or tinglings in extremities.  No facial droop or slurred speech.  No fall or injury.  Patient denies chest pain, cough, shortness breath.  No fever or chills.  Patient was found to have worsening anemia, but strongly denies dark stool or rectal bleeding.  Patient has nausea, no vomiting, diarrhea or abdominal pain.  I asked him several times if he had any dark stool or rectal bleeding, he denied.  Data reviewed independently and ED Course: pt was found to have Hgb 11.9 on 07/19/22 --> 7.8 --> 6.9.  BNP 271.  WBC 36.0, negative COVID PCR, worsening renal function with creatinine 2.51, BUN 90, GFR 25 (recent baseline creatinine 1.58 on 07/19/2022), lactic acid 3.5, troponin level 61, 67. Blood pressure soft, 94/53, heart rate 106, RR 21, oxygen saturation 100% on room air, temperature normal.chest x-ray negative.  Patient is admitted to telemetry bed as inpatient   EKG: I have personally reviewed.  Sinus rhythm, QTc 411, LAE, nonspecific T wave change  Review of Systems:   General: no fevers, chills, no body weight gain, has fatigue HEENT: no blurry vision, hearing changes or sore throat Respiratory: no dyspnea, coughing, wheezing CV: no chest pain, no palpitations GI: has nausea, no vomiting, abdominal pain, diarrhea,  constipation GU: no dysuria, burning on urination, increased urinary frequency, hematuria  Ext: has leg edema Neuro: no unilateral weakness, numbness, or tingling, no vision change or hearing loss.  Has lightheadedness, near syncope Skin: no rash, no skin tear. MSK: No muscle spasm, no deformity, no limitation of range of movement in spin Heme: No easy bruising.  Travel history: No recent long distant travel.   Allergy: No Known Allergies  Past Medical History:  Diagnosis Date   BPH (benign prostatic hyperplasia)    Cancer (HCC)    Chronic kidney disease    STAGE 3   Diabetes mellitus without complication (HCC)    HOH (hard of hearing)    AIDS   Hypertension    Inguinal hernia    Pain    CHRONIC LBP   Prostate cancer [facility])     Past Surgical History:  Procedure Laterality Date   BACK SURGERY     Lumbar   CATARACT EXTRACTION W/PHACO Right 07/10/2017   Procedure: CATARACT EXTRACTION PHACO AND INTRAOCULAR LENS PLACEMENT ([place]);  Surgeon: Birder Robson, MD;  Location: [facility] ORS;  Service: Ophthalmology;  Laterality: Right;  [place] 00:29.2 AP% 16.5 CDE 4.83 Fluid Pack Lot # [id number]   COLONOSCOPY     EYE SURGERY Bilateral    cataract extraction   HERNIA REPAIR     INGUINAL   PROSTATE BIOPSY N/A 04/08/2020   Procedure: PROSTATE BIOPSY [name];  Surgeon: Royston Cowper, MD;  Location: [facility] ORS;  Service: Urology;  Laterality: N/A;   TOTAL KNEE ARTHROPLASTY Left 03/19/2018   Procedure: TOTAL KNEE  ARTHROPLASTY;  Surgeon: Thornton Park, MD;  Location: [facility] ORS;  Service: Orthopedics;  Laterality: Left;    Social History:  reports that he has never smoked. He has never used smokeless tobacco. He reports that he does not drink alcohol and does not use drugs.  Family History:  Family History  Problem Relation Age of Onset   Diabetes Father    Breast cancer Sister      Prior to Admission medications   Medication Sig Start Date End Date Taking? Authorizing Provider   amLODipine (NORVASC) 5 MG tablet Take 5 mg by mouth daily.     [provider]  aspirin EC 81 MG tablet Take 81 mg by mouth daily.    [provider]  cholecalciferol (VITAMIN D) 1000 units tablet Take 1,000 Units by mouth daily.    [provider]  dapagliflozin propanediol (FARXIGA) 10 MG TABS tablet Take by mouth daily.    [provider]  dexamethasone (DECADRON) 4 MG tablet Take 2 tabs by mouth daily starting the day before chemo. Then take 2 tabs daily for 2 days starting day after chemo. Take with food. 06/20/22   Earlie Server, MD  dutasteride (AVODART) 0.5 MG capsule Take 0.5 mg by mouth daily. 06/21/17   [provider]  enzalutamide Gillermina Phy) 40 MG capsule Take 160 mg by mouth daily. Patient not taking: Reported on 07/05/2022    [provider]  losartan (COZAAR) 100 MG tablet Take 100 mg by mouth daily.    [provider]  lovastatin (MEVACOR) 40 MG tablet TAKE 1 TABLET BY MOUTH EVERY DAY 04/25/22   Cletis Athens, MD  metoprolol tartrate (LOPRESSOR) 50 MG tablet TAKE 1 TABLET BY MOUTH EVERY 12 HOURS 11/25/21   Cletis Athens, MD  Misc Natural Products (PROSTATE SUPPORT PO) Take 2 capsules by mouth daily.    [provider]  NOVOLOG FLEXPEN 100 UNIT/ML FlexPen Inject 7 Units into the skin 3 (three) times daily before meals. 01/03/18   [provider]  Omega-3 Fatty Acids (FISH OIL PO) Take 1 capsule by mouth daily.     [provider]  ondansetron (ZOFRAN) 8 MG tablet Take 1 tablet (8 mg total) by mouth every 8 (eight) hours as needed for nausea or vomiting. 06/20/22   Earlie Server, MD  prochlorperazine (COMPAZINE) 10 MG tablet Take 1 tablet (10 mg total) by mouth every 6 (six) hours as needed for nausea or vomiting. 06/20/22   Earlie Server, MD  relugolix (ORGOVYX) 120 MG tablet Take 1 tablet (120 mg total) by mouth daily. 07/19/22   Earlie Server, MD  TRESIBA FLEXTOUCH 200 UNIT/ML SOPN Inject 80 Units into the muscle daily  before breakfast.  06/20/17   [provider]  TRULICITY 1.5 0000000 SOPN Inject 1.5 mg into the muscle every Wednesday.  Patient not taking: Reported on 07/19/2022 04/17/17   [provider]  vitamin B-12 (CYANOCOBALAMIN) 500 MCG tablet Take 500 mcg by mouth daily.    [provider]    Physical Exam: Vitals:   07/31/22 1345 07/31/22 1400 07/31/22 1430 07/31/22 1542  BP: (!) 101/43 (!) 101/50 (!) 119/54 (!) 107/46  Pulse: 100 96 92 (!) 107  Resp: 19 17 13 18   Temp:    98.1 F (36.7 C)  TempSrc:      SpO2: 100% 100% 100% 98%  Weight:      Height:       General: Not in acute distress.  Pale looking HEENT:  Eyes: PERRL, EOMI, no scleral icterus.       ENT: No discharge from the ears and nose, no pharynx injection, no tonsillar enlargement.        Neck: No JVD, no bruit, no mass felt. Heme: No neck lymph node enlargement. Cardiac: S1/S2, RRR, No murmurs, No gallops or rubs. Respiratory: No rales, wheezing, rhonchi or rubs. GI: Soft, nondistended, nontender, no rebound pain, no organomegaly, BS present. GU: No hematuria Ext: 1+ pitting leg edema bilaterally. 1+DP/PT pulse bilaterally. Musculoskeletal: No joint deformities, No joint redness or warmth, no limitation of ROM in spin. Skin: No rashes.  Neuro: Alert, oriented X3, cranial nerves II-XII grossly intact, moves all extremities normally. Psych: Patient is not psychotic, no suicidal or hemocidal ideation.  Labs on Admission: I have personally reviewed following labs and imaging studies  CBC: Recent Labs  Lab 07/31/22 1209 07/31/22 1623  WBC 36.0* 37.6*  NEUTROABS 31.0*  --   HGB 7.8* 6.9*  HCT 23.1* 20.6*  MCV 86.2 85.5  PLT 163 [id number]*   Basic Metabolic Panel: Recent Labs  Lab 07/31/22 1209  NA 135  K 4.1  CL 102  CO2 22  GLUCOSE 221*  BUN 90*  CREATININE 2.51*  CALCIUM 8.6*   GFR: Estimated Creatinine Clearance: 29.9 mL/min (A) (by C-G formula based on SCr of 2.51 mg/dL  (H)). Liver Function Tests: Recent Labs  Lab 07/31/22 1209  AST 18  ALT 15  ALKPHOS 113  BILITOT 0.5  PROT 5.7*  ALBUMIN 3.2*   No results for input(s): "LIPASE", "AMYLASE" in the last 168 hours. No results for input(s): "AMMONIA" in the last 168 hours. Coagulation Profile: Recent Labs  Lab 07/31/22 1623  INR 1.2   Cardiac Enzymes: No results for input(s): "CKTOTAL", "CKMB", "CKMBINDEX", "TROPONINI" in the last 168 hours. BNP (last 3 results) No results for input(s): "PROBNP" in the last 8760 hours. HbA1C: No results for input(s): "HGBA1C" in the last 72 hours. CBG: No results for input(s): "GLUCAP" in the last 168 hours. Lipid Profile: Recent Labs    07/31/22 1623  CHOL 101  HDL 15*  LDLCALC 43  TRIG 217*  CHOLHDL 6.7   Thyroid Function Tests: No results for input(s): "TSH", "T4TOTAL", "FREET4", "T3FREE", "THYROIDAB" in the last 72 hours. Anemia Panel: Recent Labs    07/31/22 1623  FOLATE 13.1  FERRITIN 488*  TIBC 238*  IRON 117  RETICCTPCT 3.6*   Urine analysis:    Component Value Date/Time   COLORURINE YELLOW (A) 03/22/2018 1631   APPEARANCEUR HAZY (A) 03/22/2018 1631   LABSPEC 1.013 03/22/2018 1631   PHURINE 5.0 03/22/2018 1631   GLUCOSEU NEGATIVE 03/22/2018 1631   HGBUR MODERATE (A) 03/22/2018 1631   BILIRUBINUR NEGATIVE 03/22/2018 [place] 03/22/2018 1631   PROTEINUR 30 (A) 03/22/2018 1631   NITRITE NEGATIVE 03/22/2018 1631   LEUKOCYTESUR TRACE (A) 03/22/2018 1631   Sepsis Labs: @LABRCNTIP (procalcitonin:4,lacticidven:4) ) Recent Results (from the past 240 hour(s))  SARS Coronavirus 2 by RT PCR (hospital order, performed in [place] hospital lab) *cepheid single result test* Anterior Nasal Swab     Status: None   Collection Time: 07/31/22 12:23 PM   Specimen: Anterior Nasal Swab  Result Value Ref Range Status   SARS Coronavirus 2 by RT PCR NEGATIVE NEGATIVE Final    Comment: (NOTE) SARS-CoV-2 target nucleic acids are  NOT DETECTED.  The SARS-CoV-2 RNA is generally detectable in upper and lower respiratory specimens during the acute phase of infection. The lowest concentration  of SARS-CoV-2 viral copies this assay can detect is 250 copies / mL. A negative result does not preclude SARS-CoV-2 infection and should not be used as the sole basis for treatment or other patient management decisions.  A negative result may occur with improper specimen collection / handling, submission of specimen other than nasopharyngeal swab, presence of viral mutation(s) within the areas targeted by this assay, and inadequate number of viral copies (<250 copies / mL). A negative result must be combined with clinical observations, patient history, and epidemiological information.  Fact Sheet for Patients:   https://www.patel.info/  Fact Sheet for Healthcare Providers: https://hall.com/  This test is not yet approved or  cleared by the [place] FDA and has been authorized for detection and/or diagnosis of SARS-CoV-2 by FDA under an Emergency Use Authorization (EUA).  This EUA will remain in effect (meaning this test can be used) for the duration of the COVID-19 declaration under Section 564(b)(1) of the Act, 21 U.S.C. section 360bbb-3(b)(1), unless the authorization is terminated or revoked sooner.  Performed at [facility], [address] [place], [place] [address]      Radiological Exams on Admission: DG Chest Portable 1 View  Result Date: 07/31/2022 CLINICAL DATA:  Provided history: Sepsis. Recent syncopal episodes. History of prostate cancer. EXAM: PORTABLE CHEST 1 VIEW COMPARISON:  Head CT 06/14/2022. Prior chest radiographs 03/06/2018. FINDINGS: Heart size within normal limits. No appreciable airspace consolidation. No evidence of pleural effusion or pneumothorax. Known metastases within the thoracic spine, bilateral ribs and left scapula (demonstrated on  the prior PET CT of 06/14/2022). IMPRESSION: No evidence of an acute cardiopulmonary abnormality. Electronically Signed   By: Kellie Simmering D.O.   On: 07/31/2022 13:56      Assessment/Plan Principal Problem:   Near syncope Active Problems:   Normocytic anemia   SIRS (systemic inflammatory response syndrome) (HCC)   Essential hypertension   Acute renal failure superimposed on stage 3b chronic kidney disease (HCC)   Prostate cancer (HCC)   Type II diabetes mellitus with renal manifestations (HCC)   Myocardial injury   Bilateral leg edema   Obesity (BMI 30-39.9)   Assessment and Plan:  Near syncope: Etiology is not clear, likely multifactorial etiology.  Patient has worsening anemia, suspected GI bleeding which may have contributed partially.  Patient also has SIRS without clear source of infection identified.  Patient does not have focal deficit on physical examination, low suspicions of stroke.  -Admit to telemetry bed as inpatient -Give blood transfusion for anemia, 2 Units -IV fluid: 1 L LR in ED (since patient has elevated BNP 221, and bilateral leg edema, will not give aggressive IV fluid. -Frequent neurocheck -Orthostatic vital sign checking -Fall precaution  Normocytic anemia: Hgb 11.9 on 07/19/22 --> 7.8 --> 6.9.  Patient denies dark stool, rectal bleeding, but hemoglobin dropped significantly, highly suspect GI bleeding.  Consulted Dr. Virgina Jock of GI. - transfuse 2 units of blood now - NPO  - Start IV pantoprazole 40 mg bid - Check anemia panel - Zofran IV for nausea - Avoid NSAIDs and SQ heparin - Maintain IV access (2 large bore IVs if possible). - Monitor closely and follow q6h cbc, transfuse as necessary, if Hgb<7.0 - LaB: INR, PTT and type screen - hold ASA  SIRS (systemic inflammatory response syndrome) ([place]): pt meets criteria for SIRS with WBC 36.0, heart rate up to 106, RR 21.  Lactic acid 3.5.  Source of infection is not identified, will treat patient as SIRS.  If source of infection is identified, will change to severe sepsis.  Chest x-ray negative.  Pending urinalysis -Antibiotics: Vancomycin and cefepime (patient also received 1 dose of Flagyl in ED) -Follow-up blood culture, urine culture, urinalysis -Trend lactic acid level -Check procalcitonin level  Essential hypertension -Hold blood pressure medications due to soft blood pressure  Acute renal failure superimposed on stage 3b chronic kidney disease ([place]): May be due to volume depletion and continuation of Cozaar. -Avoid using renal toxic medications -Hold Cozaar -IV fluid as above -Follow-up renal ultrasound -Follow-up urinalysis  Prostate cancer [facility]): Metastasized prostate cancer.  Patient is on chemotherapy.  Currently on relugolix -Continue relugolix -Follow-up with Dr. Tasia Catchings and Dr. Baruch Gouty  Type II diabetes mellitus with renal manifestations [facility]): Recent A1c 5.6, well-controlled.  Patient taking NovoLog, Darel Hong, Tresiba 80 units daily -Glargine insulin 55 units daily -Sliding scale insulin  Myocardial injury: Troponin level 61 --> 67, no chest pain -Will not give aspirin due to worsening anemia -Pravastatin -Troponin level trended troponin level -Check A1c, FLP  Bilateral leg edema: Etiology is not clear.  BNP 271.  No 2D echo on record -Follow-up lower extremity venous Doppler to rule out a DVT  Obesity (BMI 30-39.9): Body weight while 9.1 kg, BMI 31.73 -Encouraged losing weight -Exercise and healthy diet.     DVT ppx: SCD  Code Status: Full code per pt  Family Communication:  Yes, patient's brother-in-law   at bed side.    Disposition Plan:  Anticipate discharge back to previous environment  Consults called: Dr. Virgina Jock of GI  Admission status and Level of care: Telemetry Medical:   as inpt      Dispo: The patient is from: Home              Anticipated d/c is to: Home              Anticipated d/c date is: 2 days              Patient currently is not  medically stable to d/c.    Severity of Illness:  The appropriate patient status for this patient is INPATIENT. Inpatient status is judged to be reasonable and necessary in order to provide the required intensity of service to ensure the patient's safety. The patient's presenting symptoms, physical exam findings, and initial radiographic and laboratory data in the context of their chronic comorbidities is felt to place them at high risk for further clinical deterioration. Furthermore, it is not anticipated that the patient will be medically stable for discharge from the hospital within 2 midnights of admission.   * I certify that at the point of admission it is my clinical judgment that the patient will require inpatient hospital care spanning beyond 2 midnights from the point of admission due to high intensity of service, high risk for further deterioration and high frequency of surveillance required.*       Date of Service 07/31/2022    Ivor Costa [facility]   If 7PM-7AM, please contact night-coverage [email or web address] 07/31/2022, 6:40 PM

## 2022-07-31 NOTE — ED Provider Notes (Signed)
[facility] Provider Note    Event Date/Time   First MD Initiated Contact with Patient 07/31/22 1206     (approximate)   History   Loss of Consciousness   HPI  Keith Howe is a 82 y.o. male with a history of prostate cancer who comes in for syncope. He is on chemotherapy, does have diabetes on insulin.  Pt jas been having recurrent episodes of syncope.  First episode happened while walking to bathroom on saturday, felt lightheaded and lowered himself to floor, did not have LOC. Wife gave insulin and he felt better.  Similar episode yesterday gets better after getting insulin. No dizziness, lightheadness, vision changes. No urinary symptoms.  Denies any chest pain, shortness of breath, abdominal pain.  Denies any known fevers.  Does not have a port.  Maybe has had some dark stool but not entirely sure.  Last chemo 15th of march    Physical Exam   Triage Vital Signs: ED Triage Vitals  Enc Vitals Group     BP --      Pulse Rate 07/31/22 1207 90     Resp 07/31/22 1207 18     Temp 07/31/22 1207 98.8 F (37.1 C)     Temp Source 07/31/22 1207 Oral     SpO2 --      Weight 07/31/22 1205 240 lb 8.4 oz (109.1 kg)     Height 07/31/22 1205 6\' 1"  (1.854 m)     Head Circumference --      Peak Flow --      Pain Score --      Pain Loc --      Pain Edu? --      Excl. in [place]? --     Most recent vital signs: Vitals:   07/31/22 1207  Pulse: 90  Resp: 18  Temp: 98.8 F (37.1 C)     General: Awake, no distress.  CV:  Good peripheral perfusion.Tachy  Resp:  Normal effort.  Abd:  No distention. Non tender on abdominal exam Other:  Edema bilateral legs    ED Results / Procedures / Treatments   Labs (all labs ordered are listed, but only abnormal results are displayed) Labs Reviewed  CBC WITH DIFFERENTIAL/PLATELET - Abnormal; Notable for the following components:      Result Value   WBC 36.0 (*)    RBC 2.68 (*)    Hemoglobin 7.8 (*)    HCT 23.1  (*)    nRBC 0.6 (*)    Neutro Abs 31.0 (*)    Monocytes Absolute 1.8 (*)    Abs Immature Granulocytes 2.17 (*)    All other components within normal limits  COMPREHENSIVE METABOLIC PANEL - Abnormal; Notable for the following components:   Glucose, Bld 221 (*)    BUN 90 (*)    Creatinine, Ser 2.51 (*)    Calcium 8.6 (*)    Total Protein 5.7 (*)    Albumin 3.2 (*)    GFR, Estimated 25 (*)    All other components within normal limits  TROPONIN I (HIGH SENSITIVITY) - Abnormal; Notable for the following components:   Troponin I (High Sensitivity) 61 (*)    All other components within normal limits  SARS CORONAVIRUS 2 BY RT PCR  CULTURE, BLOOD (ROUTINE X 2)  CULTURE, BLOOD (ROUTINE X 2)  URINALYSIS, ROUTINE W REFLEX MICROSCOPIC  LACTIC ACID, PLASMA  LACTIC ACID, PLASMA     EKG  My interpretation of EKG:  Sinus tachy rate of 104, no st elevation, TWI V5, V6, normal intervals-similar to prior in v6    RADIOLOGY I have reviewed the xray personally and interpreted no pneumonia  PROCEDURES:  Critical Care performed: Yes, see critical care procedure note(s)  .1-3 Lead EKG Interpretation  Performed by: Vanessa [place], MD Authorized by: Vanessa Crisp, MD     Interpretation: abnormal     ECG rate:  100   ECG rate assessment: tachycardic     Rhythm: sinus tachycardia     Ectopy: none     Conduction: normal   .Critical Care  Performed by: Vanessa [place], MD Authorized by: Vanessa [place], MD   Critical care provider statement:    Critical care time (minutes):  30   Critical care was necessary to treat or prevent imminent or life-threatening deterioration of the following conditions:  Sepsis   Critical care was time spent personally by me on the following activities:  Development of treatment plan with patient or surrogate, discussions with consultants, evaluation of patient's response to treatment, examination of patient, ordering and review of laboratory studies, ordering and  review of radiographic studies, ordering and performing treatments and interventions, pulse oximetry, re-evaluation of patient's condition and review of old charts    MEDICATIONS ORDERED IN ED: Medications  pantoprazole (PROTONIX) injection 40 mg (has no administration in time range)  ceFEPIme (MAXIPIME) 2 g in sodium chloride 0.9 % 100 mL IVPB (has no administration in time range)  metroNIDAZOLE (FLAGYL) IVPB 500 mg (has no administration in time range)  vancomycin (VANCOCIN) IVPB 1000 mg/200 mL premix (has no administration in time range)  lactated ringers bolus 1,000 mL (0 mLs Intravenous Stopped 07/31/22 1413)     IMPRESSION / MDM / [place] / ED COURSE  I reviewed the triage vital signs and the nursing notes.   Patient's presentation is most consistent with acute presentation with potential threat to life or bodily function.   Patient comes in with concerns for near syncopal episodes denies hitting his head abdomen soft nontender no shortness of breath oxygen level 100% doubt PE.  Labs ordered evaluate for dehydration.  Electrolyte abnormalities, AKI  CBC elevated at 36K CMP CR elevated Trop elevated demand from infection no chest pain   Patient has significantly elevated white count meet sepsis criteria I am holding off on full fluid resuscitation because he does have edema in his legs he states he is not sure if this is new or old.  After liter of fluids his blood pressures have come up.  I started broad-spectrum antibiotics given unclear source at this time.  No fevers.  He does not have a slightly low hemoglobin but that would really explain the elevated white count.  He reports maybe having some dark stools.  We discussed rectal exam but given he is got the prostate cancer and the significantly elevated white count I am not sure if a rectal exam would be the best step.  He states that he revealed to give me a bowel movement we can test it later.  We will start on PPI  just in case this is a GI bleed and get a type and screen.  He is got no history of coronary caths that he needs a transfusion unless he gets less than 7.  I will discuss the hospital team for admission   The patient is on the cardiac monitor to evaluate for evidence of arrhythmia and/or significant heart rate changes.  FINAL CLINICAL IMPRESSION(S) / ED DIAGNOSES   Final diagnoses:  Seizure ([place])  Near syncope     Rx / DC Orders   ED Discharge Orders     None        Note:  This document was prepared using Dragon voice recognition software and may include unintentional dictation errors.   Vanessa [place], MD 07/31/22 [phone]

## 2022-07-31 NOTE — Consult Note (Signed)
Pharmacy Antibiotic Note  Keith Howe is a 82 y.o. male admitted on 07/31/2022 with syncope.  Pharmacy has been consulted for cefepime and vancomycin dosing for possible sepsis without clear source.  Patient is currently undergoing treatment for prostate cancer, including chemotherapy with docetaxel and oral therapy with Orgovyx.  Patient currently has AKI, Scr 2.51 (3/13 Scr 1.58)  Cefepime 2 grams IV x 1 given 3/25 @ 1424  Plan: Give total of vancomycin 2500 mg IV x 1 for loading dose. Will dose vancomycin based on levels until renal function is stable. Start cefepime 2 grams IV every 24 hours Follow renal function and cultures for adjustments   Height: 6\' 1"  (185.4 cm) Weight: 109.1 kg (240 lb 8.4 oz) IBW/kg (Calculated) : 79.9  Temp (24hrs), Avg:98.5 F (36.9 C), Min:98.1 F (36.7 C), Max:98.8 F (37.1 C)  Recent Labs  Lab 07/31/22 1209 07/31/22 1250  WBC 36.0*  --   CREATININE 2.51*  --   LATICACIDVEN  --  3.5*    Estimated Creatinine Clearance: 29.9 mL/min (A) (by C-G formula based on SCr of 2.51 mg/dL (H)).    No Known Allergies  Antimicrobials this admission: Vancomycin 3/25 >>  cefepime 3/25 >>  Flagyl x 1 in ED on 3/25  Dose adjustments this admission: N/A  Microbiology results: 3/25 BCx: pending 3/25 UCx: ordered    Thank you for allowing pharmacy to be a part of this patient's care.  Lorin Picket, PharmD 07/31/2022 4:04 PM

## 2022-07-31 NOTE — ED Triage Notes (Signed)
Patient has had three syncopal episodes in two days; denies pain. Currently being treated for Prostate Cancer.

## 2022-08-01 DIAGNOSIS — R55 Syncope and collapse: Secondary | ICD-10-CM

## 2022-08-01 LAB — CBC
HCT: 26.1 % — ABNORMAL LOW (ref 39.0–52.0)
Hemoglobin: 9.1 g/dL — ABNORMAL LOW (ref 13.0–17.0)
MCH: 29.2 pg (ref 26.0–34.0)
MCHC: 34.9 g/dL (ref 30.0–36.0)
MCV: 83.7 fL (ref 80.0–100.0)
Platelets: 122 10*3/uL — ABNORMAL LOW (ref 150–400)
RBC: 3.12 MIL/uL — ABNORMAL LOW (ref 4.22–5.81)
RDW: 13.8 % (ref 11.5–15.5)
WBC: 27.6 10*3/uL — ABNORMAL HIGH (ref 4.0–10.5)
nRBC: 1 % — ABNORMAL HIGH (ref 0.0–0.2)

## 2022-08-01 LAB — BASIC METABOLIC PANEL
Anion gap: 7 (ref 5–15)
BUN: 75 mg/dL — ABNORMAL HIGH (ref 8–23)
CO2: 23 mmol/L (ref 22–32)
Calcium: 8.1 mg/dL — ABNORMAL LOW (ref 8.9–10.3)
Chloride: 108 mmol/L (ref 98–111)
Creatinine, Ser: 2.12 mg/dL — ABNORMAL HIGH (ref 0.61–1.24)
GFR, Estimated: 31 mL/min — ABNORMAL LOW (ref >60)
Glucose, Bld: 48 mg/dL — ABNORMAL LOW (ref 70–99)
Potassium: 3.4 mmol/L — ABNORMAL LOW (ref 3.5–5.1)
Sodium: 138 mmol/L (ref 135–145)

## 2022-08-01 LAB — GLUCOSE, CAPILLARY
Glucose-Capillary: 44 mg/dL — CL (ref 70–99)
Glucose-Capillary: 159 mg/dL — ABNORMAL HIGH (ref 70–99)
Glucose-Capillary: 158 mg/dL — ABNORMAL HIGH (ref 70–99)
Glucose-Capillary: 129 mg/dL — ABNORMAL HIGH (ref 70–99)

## 2022-08-01 LAB — HEMOGLOBIN A1C
Hgb A1c MFr Bld: 6.8 % — ABNORMAL HIGH (ref 4.8–5.6)
Mean Plasma Glucose: 148 mg/dL

## 2022-08-01 LAB — LACTIC ACID, PLASMA: Lactic Acid, Venous: 1.1 mmol/L (ref 0.5–1.9)

## 2022-08-01 MED ORDER — GLUCERNA SHAKE PO LIQD
237.0000 mL | Freq: Three times a day (TID) | ORAL | Status: DC
  Administered 2022-08-01 (×3): 237 mL via ORAL

## 2022-08-01 MED ORDER — INSULIN GLARGINE-YFGN 100 UNIT/ML ~~LOC~~ SOLN
40.0000 [IU] | Freq: Every day | SUBCUTANEOUS | Status: DC
  Administered 2022-08-01: 40 [IU] via SUBCUTANEOUS
  Filled 2022-08-01 (×2): qty 0.4

## 2022-08-01 MED ORDER — METOPROLOL TARTRATE 25 MG PO TABS: 25.0000 mg | ORAL_TABLET | Freq: Two times a day (BID) | ORAL | Status: DC

## 2022-08-01 MED ORDER — METOPROLOL TARTRATE 50 MG PO TABS
50.0000 mg | ORAL_TABLET | Freq: Two times a day (BID) | ORAL | Status: DC
  Administered 2022-08-01 – 2022-08-02 (×2): 50 mg via ORAL
  Filled 2022-08-01 (×2): qty 1

## 2022-08-01 MED ORDER — POTASSIUM CHLORIDE CRYS ER 20 MEQ PO TBCR
40.0000 meq | EXTENDED_RELEASE_TABLET | Freq: Once | ORAL | Status: AC
  Administered 2022-08-01: 40 meq via ORAL
  Filled 2022-08-01: qty 2

## 2022-08-01 MED ORDER — PANTOPRAZOLE SODIUM 40 MG PO TBEC
40.0000 mg | DELAYED_RELEASE_TABLET | Freq: Every day | ORAL | Status: DC
  Administered 2022-08-01 – 2022-08-02 (×2): 40 mg via ORAL
  Filled 2022-08-01 (×2): qty 1

## 2022-08-01 MED ORDER — METOPROLOL TARTRATE 50 MG PO TABS: 50.0000 mg | ORAL_TABLET | Freq: Two times a day (BID) | ORAL | Status: DC

## 2022-08-01 MED ORDER — ENSURE ENLIVE PO LIQD: 237.0000 mL | Freq: Two times a day (BID) | ORAL | Status: DC

## 2022-08-01 NOTE — Evaluation (Signed)
Physical Therapy Evaluation Patient Details Name: Keith Howe MRN: [id number] DOB: [birth year] Today's Date: 08/01/2022  History of Present Illness  Pt is an 82 y/o M admitted on 07/31/22 after presenting with c/o near syncope. Pt found to have worsening anemia. Pt is admitted for near syncope with etiology likely related to anemia in the setting of chemotherapy. Pt received 2 units of PRBC. PMH: prostate CA on chemotherapy, HTN, DM, HOH, obesity, CKD 3B  Clinical Impression  Pt seen for PT evaluation with pt's wife present for session. Prior to admission pt was residing in a split level home with level entry but 3 steps without rails (can hold to walls) to the kitchen, 6 steps up to bedroom with R rail, & 6 steps down to basement. Pt initially reports he's ambulatory with [facility] but later states he uses [facility]. Provided pt with SPC & pt ambulates with min assist with pt consistently reaching for object for support with other UE. Provided pt with RW & pt ambulates with CGA<>supervision with improved balance.  Educated pt & wife on recommendation to use RW at home for increased balance. Pt also noted to be in a-fib with HR 118-166 bpm during session (no c/o symptoms & HR decreased with rest) & MD & nurse notified.     Recommendations for follow up therapy are one component of a multi-disciplinary discharge planning process, led by the attending physician.  Recommendations may be updated based on patient status, additional functional criteria and insurance authorization.  Follow Up Recommendations       Assistance Recommended at Discharge Intermittent Supervision/Assistance  Patient can return home with the following  Assistance with cooking/housework;Assist for transportation;A little help with walking and/or transfers;Help with stairs or ramp for entrance    Equipment Recommendations None recommended by PT (pt reports he has a RW)  Recommendations for Other Services       Functional Status  Assessment Patient has had a recent decline in their functional status and demonstrates the ability to make significant improvements in function in a reasonable and predictable amount of time.     Precautions / Restrictions Precautions Precautions: Fall Restrictions Weight Bearing Restrictions: No      Mobility  Bed Mobility Overal bed mobility: Modified Independent             General bed mobility comments: supine<>Sit    Transfers Overall transfer level: Needs assistance Equipment used: Straight cane Transfers: Sit to/from Stand Sit to Stand: Min guard           General transfer comment: extra time & effort to power up to standing    Ambulation/Gait Ambulation/Gait assistance: Min assist Gait Distance (Feet): 35 Feet (+ 18 ft) Assistive device: Rolling walker (2 wheels), Straight cane Gait Pattern/deviations: Decreased step length - right, Decreased step length - left, Decreased stride length Gait velocity: decreased     General Gait Details: Pt ambulates 35 ft with SPC but pt reaching for wall, bed, rail for other UE support throughout gait, initially stating "I don't want to fall" but later stating "it's just a habit". Provided pt with RW & pt ambulated 2nd time with RW & CGA with improving balance.  Stairs            Wheelchair Mobility    Modified Rankin (Stroke Patients Only)       Balance Overall balance assessment: Needs assistance Sitting-balance support: Feet supported, No upper extremity supported Sitting balance-Leahy Scale: Fair     Standing balance support: During  functional activity, Single extremity supported Standing balance-Leahy Scale: Poor                               Pertinent Vitals/Pain Pain Assessment Pain Assessment: No/denies pain    Home Living Family/patient expects to be discharged to:: Private residence Living Arrangements: Spouse/significant other Available Help at Discharge: Family Type of  Home: House (split level home) Home Access: Level entry     Alternate Level Stairs-Number of Steps: 3 steps without rails up to kitchen, 6 steps to bedroom with R rail, 6 steps down to basement Home Layout: Bed/bath upstairs;Multi-level Home Equipment: [employer] - [employer] representative (2 wheels)      Prior Function Prior Level of Function : Independent/Modified Independent             Mobility Comments: Pt reports he's ambulatory with [facility], denies falls (reports the 2-3 syncopal episodes prior to admission)       Hand Dominance        Extremity/Trunk Assessment   Upper Extremity Assessment Upper Extremity Assessment: Overall WFL for tasks assessed    Lower Extremity Assessment Lower Extremity Assessment: Generalized weakness (BLE edema)       Communication   Communication: HOH (even with hearing aides)  Cognition Arousal/Alertness: Awake/alert Behavior During Therapy: WFL for tasks assessed/performed Overall Cognitive Status: Within Functional Limits for tasks assessed                                          General Comments General comments (skin integrity, edema, etc.): Pt noted to be in a-fib with max HR of 166 bpm, but does decrease to 118 with rest. Pt without c/o symptoms. MD & nurse notified.    Exercises     Assessment/Plan    PT Assessment Patient needs continued PT services  PT Problem List Decreased activity tolerance;Decreased balance;Decreased mobility;Decreased safety awareness;Decreased knowledge of use of DME;Decreased strength       PT Treatment Interventions DME instruction;Therapeutic exercise;Gait training;Balance training;Stair training;Neuromuscular re-education;Functional mobility training;Therapeutic activities;Patient/family education    PT Goals (Current goals can be found in the Care Plan section)  Acute Rehab PT Goals Patient Stated Goal: go home PT Goal Formulation: With patient Time For Goal Achievement:  08/15/22 Potential to Achieve Goals: Good    Frequency Min 2X/week     Co-evaluation               AM-PAC PT "6 Clicks" Mobility  Outcome Measure Help needed turning from your back to your side while in a flat bed without using bedrails?: None Help needed moving from lying on your back to sitting on the side of a flat bed without using bedrails?: None Help needed moving to and from a bed to a chair (including a wheelchair)?: A Little Help needed standing up from a chair using your arms (e.g., wheelchair or bedside chair)?: A Little Help needed to walk in hospital room?: A Little Help needed climbing 3-5 steps with a railing? : A Little 6 Click Score: 20    End of Session   Activity Tolerance: Treatment limited secondary to medical complications (Comment);Patient tolerated treatment well (limited 2/2 elevated HR with mobility) Patient left: in bed;with family/visitor present;with call bell/phone within reach Nurse Communication: Mobility status (HR) PT Visit Diagnosis: Unsteadiness on feet (R26.81);Muscle weakness (generalized) (M62.81)    Time:  [id number] PT Time Calculation (min) (ACUTE ONLY): 14 min   Charges:   PT Evaluation $PT Eval Moderate Complexity: [place], PT, DPT 08/01/22, 1:18 PM   Waunita Schooner 08/01/2022, 1:16 PM

## 2022-08-01 NOTE — Consult Note (Addendum)
GI Inpatient Consult Note  Reason for Consult: anemia   Attending Requesting Consult: Dr. Blaine Hamper  History of Present Illness: Keith Howe is a 82 y.o. male seen for evaluation of anemia at the request of Dr. Blaine Hamper. Patient presented to ED for two episodes of near syncope. On admission noted to have significant anemia. Labs remarkable for WBC 36, HR 106, RR 21 with elevated lactic acid 3.5. Began Vancomycin and cefepime. PMHx of prostate Ca on relugolix, CKD state 3.   Patient reports having a BM about every 2 days, he denies any issues w/ rectal bleeding. No frequent dark stools and denies any melena. He denies any abd pain. He denies any GERD sx. No dysphagia. Notes his appetite has been less since starting chemo associated w/ mild nausea after treatments but denies any vomiting.   Non-smoker. Does not drink alcohol. No nsaids. Reports a prior colonoscopy years ago not found on file. Has not had EGD in past. Denies any family hx colon polyps or colon cancer.   Past Medical History:  Past Medical History:  Diagnosis Date   BPH (benign prostatic hyperplasia)    Cancer ([place])    Chronic kidney disease    STAGE 3   Diabetes mellitus without complication ([place])    HOH (hard of hearing)    AIDS   Hypertension    Inguinal hernia    Pain    CHRONIC LBP   Prostate cancer ([place])     Problem List: Patient Active Problem List   Diagnosis Date Noted   Near syncope 07/31/2022   SIRS (systemic inflammatory response syndrome) ([place]) 07/31/2022   Obesity (BMI 30-39.9) 07/31/2022   Type II diabetes mellitus with renal manifestations ([place]) 07/31/2022   Myocardial injury 07/31/2022   Bilateral leg edema 07/31/2022   Normocytic anemia 07/31/2022   Chemotherapy induced neutropenia ([place]) 07/05/2022   Encounter for antineoplastic chemotherapy 05/26/2022   Prostate cancer ([place]) 07/13/2021   Goals of care, counseling/discussion 07/13/2021   Injury of quadriceps muscle 08/26/2020   Acute renal  failure superimposed on stage 3b chronic kidney disease ([place]) 05/27/2020   Annual physical exam 02/18/2020   Need for influenza vaccination 02/18/2020   Essential hypertension 12/15/2019   Class 1 obesity with serious comorbidity and body mass index (BMI) of 31.0 to 31.9 in adult 10/13/2019   Type 2 diabetes mellitus without complication, with long-term current use of insulin ([place]) 10/13/2019   S/P TKR (total knee replacement) using cement, left 03/19/2018    Past Surgical History: Past Surgical History:  Procedure Laterality Date   BACK SURGERY     Lumbar   CATARACT EXTRACTION W/PHACO Right 07/10/2017   Procedure: CATARACT EXTRACTION PHACO AND INTRAOCULAR LENS PLACEMENT ([place]);  Surgeon: Birder Robson, MD;  Location: [facility] ORS;  Service: Ophthalmology;  Laterality: Right;  [place] 00:29.2 AP% 16.5 CDE 4.83 Fluid Pack Lot # [id number]   COLONOSCOPY     EYE SURGERY Bilateral    cataract extraction   HERNIA REPAIR     INGUINAL   PROSTATE BIOPSY N/A 04/08/2020   Procedure: PROSTATE BIOPSY [name];  Surgeon: Royston Cowper, MD;  Location: [facility] ORS;  Service: Urology;  Laterality: N/A;   TOTAL KNEE ARTHROPLASTY Left 03/19/2018   Procedure: TOTAL KNEE ARTHROPLASTY;  Surgeon: Thornton Park, MD;  Location: [facility] ORS;  Service: Orthopedics;  Laterality: Left;    Allergies: No Known Allergies  Home Medications: Medications Prior to Admission  Medication Sig Dispense Refill Last Dose   amLODipine (NORVASC) 5 MG  tablet Take 2.5 mg by mouth daily.   07/30/2022 at am   aspirin EC 81 MG tablet Take 81 mg by mouth daily.   07/30/2022 at am   cholecalciferol (VITAMIN D) 1000 units tablet Take 1,000 Units by mouth daily.   07/30/2022   dapagliflozin propanediol (FARXIGA) 10 MG TABS tablet Take by mouth daily.   07/30/2022 at am   dexamethasone (DECADRON) 4 MG tablet Take 2 tabs by mouth daily starting the day before chemo. Then take 2 tabs daily for 2 days starting day after chemo. Take with food. 36  tablet 1 Past Week at unknown   loratadine (CLARITIN) 10 MG tablet Take 10 mg by mouth once daily before chemo and at least 2 days after injection.   Past Week at unknown   losartan (COZAAR) 100 MG tablet Take 100 mg by mouth daily.   07/30/2022 at am   lovastatin (MEVACOR) 40 MG tablet TAKE 1 TABLET BY MOUTH EVERY DAY 90 tablet 3 07/30/2022 at unknown   metoprolol tartrate (LOPRESSOR) 50 MG tablet TAKE 1 TABLET BY MOUTH EVERY 12 HOURS 180 tablet 2 07/30/2022 at pm   Misc Natural Products (PROSTATE SUPPORT PO) Take 2 capsules by mouth daily.   07/30/2022 at am   NOVOLOG FLEXPEN 100 UNIT/ML FlexPen Inject 7 Units into the skin 3 (three) times daily before meals.  0 07/31/2022 at am   Omega-3 Fatty Acids (FISH OIL PO) Take 1 capsule by mouth daily.    07/30/2022 at am   ondansetron (ZOFRAN) 8 MG tablet Take 1 tablet (8 mg total) by mouth every 8 (eight) hours as needed for nausea or vomiting. 30 tablet 1 prn at prn   prochlorperazine (COMPAZINE) 10 MG tablet Take 1 tablet (10 mg total) by mouth every 6 (six) hours as needed for nausea or vomiting. 30 tablet 1 prn at prn   relugolix (ORGOVYX) 120 MG tablet Take 1 tablet (120 mg total) by mouth daily. 30 tablet 3 07/30/2022 at am   TRESIBA FLEXTOUCH 200 UNIT/ML SOPN Inject 80 Units into the muscle daily before breakfast.   2 07/31/2022 at am   vitamin B-12 (CYANOCOBALAMIN) 500 MCG tablet Take 500 mcg by mouth daily.   07/30/2022 at am   dutasteride (AVODART) 0.5 MG capsule Take 0.5 mg by mouth daily. (Patient not taking: Reported on 07/31/2022)  3 Not Taking   enzalutamide (XTANDI) 40 MG capsule Take 160 mg by mouth daily. (Patient not taking: Reported on [id number])      TRULICITY 1.5 0000000 SOPN Inject 1.5 mg into the muscle every Wednesday.  (Patient not taking: Reported on 07/19/2022)  1    Home medication reconciliation was completed with the patient.   Scheduled Inpatient Medications:    cholecalciferol  1,000 Units Oral Daily   cyanocobalamin  500 mcg  Oral Daily   insulin aspart  0-5 Units Subcutaneous QHS   insulin aspart  0-9 Units Subcutaneous TID WC   insulin glargine-yfgn  55 Units Subcutaneous Daily   omega-3 acid ethyl esters  1 g Oral Daily   pantoprazole (PROTONIX) IV  40 mg Intravenous Q12H   pravastatin  40 mg Oral q1800   relugolix  120 mg Oral Daily   vancomycin variable dose per unstable renal function (pharmacist dosing)   Does not apply See admin instructions    Continuous Inpatient Infusions:    ceFEPime (MAXIPIME) IV      PRN Inpatient Medications:  acetaminophen, hydrALAZINE, loratadine, ondansetron (ZOFRAN) IV  Family History: family  history includes Breast cancer in his sister; Diabetes in his father.    Social History:   reports that he has never smoked. He has never used smokeless tobacco. He reports that he does not drink alcohol and does not use drugs.   Review of Systems: Constitutional: Weight is stable.  Eyes: No changes in vision. ENT: No oral lesions, sore throat.  GI: see HPI.  Heme/Lymph: No easy bruising.  CV: No chest pain.  GU: No hematuria.  Integumentary: No rashes.  Neuro: No headaches.  Psych: No depression/anxiety.  Endocrine: No heat/cold intolerance.  Allergic/Immunologic: No urticaria.  Resp: No cough, SOB.  Musculoskeletal: No joint swelling.    Physical Examination: BP (!) 119/59   Pulse 97   Temp 98.3 F (36.8 C) (Oral)   Resp 16   Ht 6\' 1"  (1.854 m)   Wt 108.5 kg   SpO2 99%   BMI 31.56 kg/m  Gen: NAD, alert and oriented x 4 HEENT: PEERLA, EOMI, Neck: supple, no JVD or thyromegaly Chest: CTA bilaterally, no wheezes, crackles, or other adventitious sounds CV: sinus tach, no m/g/c/r Abd: soft, NT, ND, +BS in all four quadrants; no HSM, guarding, ridigity, or rebound tenderness Ext: no edema, well perfused with 2+ pulses, Skin: no rash or lesions noted on exposed areas  Data: Lab Results  Component Value Date   WBC 27.6 (H) 08/01/2022   HGB 9.1 (L)  08/01/2022   HCT 26.1 (L) 08/01/2022   MCV 83.7 08/01/2022   PLT 122 (L) 08/01/2022   Recent Labs  Lab 07/31/22 1623 07/31/22 1915 08/01/22 0641  HGB 6.9* 6.9* 9.1*   Lab Results  Component Value Date   NA 138 08/01/2022   K 3.4 (L) 08/01/2022   CL 108 08/01/2022   CO2 23 08/01/2022   BUN 75 (H) 08/01/2022   CREATININE 2.12 (H) 08/01/2022   Lab Results  Component Value Date   ALT 15 07/31/2022   AST 18 07/31/2022   ALKPHOS 113 07/31/2022   BILITOT 0.5 07/31/2022   Recent Labs  Lab 07/31/22 1623  APTT 26  INR 1.2      Assessment/Plan: Mr. Gaster is a 82 y.o. male admitted for syncope found to have anemia.   Anemia - normocytic, iron normal and ferritin elevated with elevated % sat. Patient denies any obvious GI blood loss, feel that iron studies are not supportive of GI bleeding and suspect anemia is drive by chemo (Docetaxel on 3/13 & 07/21/22). Case discussed w/ oncology who agrees this is likely source of anemia. He is s/p 2 units and Hgb improved appropriately (6.9-->9.1). Hemodynamically stable. Will hold off on EGD/colonoscopy at this time - could consider if develops any signs of GI blood loss.  Prostate cancer Leukocytosis - infectious work up negative, may be chemo related, primary team following HTN CKD/AKI DM  Recommendations: Hold off on EGD/colonoscopy  Can advance diet given no planned endoscopic procedures Monitor H&H. Transfusion and resuscitation as per primary team Avoid frequent lab draws to prevent lab induced anemia Supportive care as per primary team Maintain two sites IV access Avoid nsaids Monitor for GIB.  Case was discussed with Dr. Virgina Jock Thank you for the consult. Please call with questions or concerns.  Ezzard Standing, PA-C [facility]

## 2022-08-01 NOTE — Progress Notes (Signed)
PROGRESS NOTE    Keith Howe  [id number] DOB: 09-01-1940 DOA: 07/31/2022 PCP: Earlie Server, MD    Brief Narrative:   82 y.o. male with medical history significant of prostate cancer on chemotherapy, hypertension, diabetes mellitus, hard of hearing, obesity, CKD-3B, who presents with near syncope.   Patient states that he had 2 episode of near syncope.  First episode happened Saturday when he was using bathroom.  He felt lightheaded, almost passed out, but did not have LOC.  It happened again yesterday.  Patient denies unilateral numbness or tinglings in extremities.  No facial droop or slurred speech.  No fall or injury.  Patient denies chest pain, cough, shortness breath.  No fever or chills.  Patient was found to have worsening anemia, but strongly denies dark stool or rectal bleeding.  Patient has nausea, no vomiting, diarrhea or abdominal pain.  I asked him several times if he had any dark stool or rectal bleeding, he denied.  3/26: Evaluated patient with GI consultant.  Discussed with oncology.  No evidence of acute blood loss.  Iron indices do not indicate this.  No plans for endoscopic evaluation.  Per oncology patient did receive GM-CSF and is likely driving leukocytosis.  No evidence of infection.   Assessment & Plan:   Principal Problem:   Near syncope Active Problems:   Normocytic anemia   SIRS (systemic inflammatory response syndrome) (HCC)   Essential hypertension   Acute renal failure superimposed on stage 3b chronic kidney disease (HCC)   Prostate cancer (HCC)   Type II diabetes mellitus with renal manifestations (HCC)   Myocardial injury   Bilateral leg edema   Obesity (BMI 30-39.9)  Near syncope:  Etiology likely related to anemia in the setting of chemotherapy.  Do not suspect acute bleed.  No evidence of infection.  No focal deficit on exam.  Low suspicion for CVA. Plan: Continue monitoring as inpatient.  Frequent neurochecks.  Fall precautions.  Therapy  evaluations will be requested.  Encourage p.o. fluid intake.  No IV fluids at this time.  Received 1 L fluid in ED.  Normocytic anemia Hgb 11.9 on 07/19/22 --> 7.8 --> 6.9.  Patient denies dark stool, rectal bleeding, but hemoglobin dropped significantly.  Iron indices do not suggest acute blood loss.  Case discussed with GI.  No plans for endoscopic evaluation.  Patient status post 2 units PRBC transfusion.  Hemoglobin responded appropriately. Plan: No further transfusion P.o. PPI daily Okay for diet Daily CBC Hold aspirin for today, can likely restart in 24 hours  SIRS (systemic inflammatory response syndrome) (HCC) pt meets criteria for SIRS with WBC 36.0, heart rate up to 106, RR 21.  Lactic acid 3.5.  No clear source of infection.  Chest x-ray negative, urinalysis negative.  Blood and urine cultures drawn on admission.  These are pending but negative to date.  Procalcitonin not indicative of acute infection.   Plan:  Suspect SIRS criteria driven by anemia in the setting of chemotherapy.  No indication for antibiotics at this time.  Will stop.  Monitor vitals and fever curve.  Source of infection is not    Essential hypertension BP meds held on admission.  Will slowly restart.  Start metoprolol at half home dose of 25 mg twice daily.  Monitor vital follow, restart medications as appropriate.  Acute renal failure superimposed on stage 3b chronic kidney disease ([place]) May be due to volume depletion and continuation of Cozaar..  Also secondary to chemotherapy.  Renal ultrasound negative.  Urinalysis clear. Plan: Hold IV fluids for now.  Hold Cozaar.  Recheck creatinine in a.m.  Avoid nephrotoxins.  Prostate cancer ([place])  Metastasized prostate cancer.  Patient is on chemotherapy.  Currently on relugolix Plan: Continue relugolix Oncology consulted.  Case discussed with Dr. Tasia Catchings.   Type II diabetes mellitus with renal manifestations ([place])  Recent A1c 5.6, well-controlled.  Patient taking  NovoLog, Darel Hong, Tresiba 80 units daily Hypoglycemia noted Plan: Okay for carb modified diet Start Semglee 40 units nightly Sensitive sliding scale Accu-Cheks before meals and at bedtime  Myocardial injury Troponin level 61 --> 67, no chest pain.  Suspect supply demand ischemia in the setting of anemia and active chemotherapy.  Bilateral leg edema  Etiology is not clear.  BNP 271.  No 2D echo on record Lower extremity Doppler negative for VTE   Obesity (BMI 30-39.9) BMI 31.73    DVT prophylaxis: SCD Code Status: Full Family Communication: Sister at bedside 3/26 Disposition Plan: Status is: Inpatient Remains inpatient appropriate because: Severe anemia in the setting of active chemotherapy.  Near syncope.   Level of care: Telemetry Medical  Consultants:  GI, signed off Oncology  Procedures:  None  Antimicrobials: None   Subjective: Seen and examined.  Resting comfortably bed.  No visible distress.  No complaints of pain.  Objective: Vitals:   08/01/22 0442 08/01/22 0444 08/01/22 0542 08/01/22 0856  BP: (!) 124/56 (!) 124/56 (!) 119/59 124/65  Pulse: 83 100 97 100  Resp:  18 16 20   Temp: 98.4 F (36.9 C) 98.4 F (36.9 C) 98.3 F (36.8 C) 97.6 F (36.4 C)  TempSrc: Oral Oral Oral   SpO2:  100% 99% 100%  Weight:      Height:        Intake/Output Summary (Last 24 hours) at 08/01/2022 1048 Last data filed at 08/01/2022 1000 Gross per 24 hour  Intake 2710.07 ml  Output 1400 ml  Net 1310.07 ml   Filed Weights   07/31/22 1205 07/31/22 2013  Weight: 109.1 kg 108.5 kg    Examination:  General exam: Appears calm and comfortable  Respiratory system: Clear to auscultation. Respiratory effort normal. Cardiovascular system: S1-S2, RRR, no murmurs, no pedal edema Gastrointestinal system: Soft, NT/ND, normal bowel sounds Central nervous system: Alert and oriented. No focal neurological deficits. Extremities: Symmetric 5 x 5 power. Skin: No rashes, lesions  or ulcers Psychiatry: Judgement and insight appear normal. Mood & affect appropriate.     Data Reviewed: I have personally reviewed following labs and imaging studies  CBC: Recent Labs  Lab 07/31/22 1209 07/31/22 1623 07/31/22 1915 08/01/22 0641  WBC 36.0* 37.6* 35.1* 27.6*  NEUTROABS 31.0*  --   --   --   HGB 7.8* 6.9* 6.9* 9.1*  HCT 23.1* 20.6* 20.6* 26.1*  MCV 86.2 85.5 85.5 83.7  PLT 163 144* 145* 123XX123*   Basic Metabolic Panel: Recent Labs  Lab 07/31/22 1209 08/01/22 0641  NA 135 138  K 4.1 3.4*  CL 102 108  CO2 22 23  GLUCOSE 221* 48*  BUN 90* 75*  CREATININE 2.51* 2.12*  CALCIUM 8.6* 8.1*   GFR: Estimated Creatinine Clearance: 35.3 mL/min (A) (by C-G formula based on SCr of 2.12 mg/dL (H)). Liver Function Tests: Recent Labs  Lab 07/31/22 1209  AST 18  ALT 15  ALKPHOS 113  BILITOT 0.5  PROT 5.7*  ALBUMIN 3.2*   No results for input(s): "LIPASE", "AMYLASE" in the last 168 hours. No results for input(s): "AMMONIA" in the  last 168 hours. Coagulation Profile: Recent Labs  Lab 07/31/22 1623  INR 1.2   Cardiac Enzymes: No results for input(s): "CKTOTAL", "CKMB", "CKMBINDEX", "TROPONINI" in the last 168 hours. BNP (last 3 results) No results for input(s): "PROBNP" in the last 8760 hours. HbA1C: Recent Labs    07/31/22 1623  HGBA1C 6.8*   CBG: Recent Labs  Lab 07/31/22 1902 07/31/22 2004 08/01/22 0901  GLUCAP 178* 157* 44*   Lipid Profile: Recent Labs    07/31/22 1623  CHOL 101  HDL 15*  LDLCALC 43  TRIG 217*  CHOLHDL 6.7   Thyroid Function Tests: No results for input(s): "TSH", "T4TOTAL", "FREET4", "T3FREE", "THYROIDAB" in the last 72 hours. Anemia Panel: Recent Labs    07/31/22 1623  VITAMINB12 >7,500*  FOLATE 13.1  FERRITIN 488*  TIBC 238*  IRON 117  RETICCTPCT 3.6*   Sepsis Labs: Recent Labs  Lab 07/31/22 1250 07/31/22 1422 08/01/22 0641  PROCALCITON  --  0.28  --   LATICACIDVEN 3.5*  --  1.1    Recent Results  (from the past 240 hour(s))  SARS Coronavirus 2 by RT PCR (hospital order, performed in [facility] hospital lab) *cepheid single result test* Anterior Nasal Swab     Status: None   Collection Time: 07/31/22 12:23 PM   Specimen: Anterior Nasal Swab  Result Value Ref Range Status   SARS Coronavirus 2 by RT PCR NEGATIVE NEGATIVE Final    Comment: (NOTE) SARS-CoV-2 target nucleic acids are NOT DETECTED.  The SARS-CoV-2 RNA is generally detectable in upper and lower respiratory specimens during the acute phase of infection. The lowest concentration of SARS-CoV-2 viral copies this assay can detect is 250 copies / mL. A negative result does not preclude SARS-CoV-2 infection and should not be used as the sole basis for treatment or other patient management decisions.  A negative result may occur with improper specimen collection / handling, submission of specimen other than nasopharyngeal swab, presence of viral mutation(s) within the areas targeted by this assay, and inadequate number of viral copies (<250 copies / mL). A negative result must be combined with clinical observations, patient history, and epidemiological information.  Fact Sheet for Patients:   https://www.patel.info/  Fact Sheet for Healthcare Providers: https://hall.com/  This test is not yet approved or  cleared by the [place] FDA and has been authorized for detection and/or diagnosis of SARS-CoV-2 by FDA under an Emergency Use Authorization (EUA).  This EUA will remain in effect (meaning this test can be used) for the duration of the COVID-19 declaration under Section 564(b)(1) of the Act, 21 U.S.C. section 360bbb-3(b)(1), unless the authorization is terminated or revoked sooner.  Performed at [facility], [address] [place], [place] [address]   Blood culture (routine x 2)     Status: None (Preliminary result)   Collection Time: 07/31/22 12:50 PM    Specimen: BLOOD  Result Value Ref Range Status   Specimen Description BLOOD LEFT ANTECUBITAL  Final   Special Requests   Final    BOTTLES DRAWN AEROBIC AND ANAEROBIC Blood Culture adequate volume   Culture   Final    NO GROWTH < 24 HOURS Performed at [facility], [address] [place], [place] [address]    Report Status PENDING  Incomplete  Blood culture (routine x 2)     Status: None (Preliminary result)   Collection Time: 07/31/22 12:50 PM   Specimen: BLOOD  Result Value Ref Range Status   Specimen Description BLOOD  BLOOD LEFT FOREARM  Final   Special Requests   Final    BOTTLES DRAWN AEROBIC AND ANAEROBIC Blood Culture adequate volume   Culture   Final    NO GROWTH < 24 HOURS Performed at [facility], [address] [place], [place] [address]    Report Status PENDING  Incomplete         Radiology Studies: US Venous Img Lower Bilateral (DVT)  Result Date: 07/31/2022 CLINICAL DATA:  Acute renal injury with shortness of breath, initial encounter EXAM: BILATERAL LOWER EXTREMITY VENOUS DOPPLER ULTRASOUND TECHNIQUE: Gray-scale sonography with graded compression, as well as color Doppler and duplex ultrasound were performed to evaluate the lower extremity deep venous systems from the level of the common femoral vein and including the common femoral, femoral, profunda femoral, popliteal and calf veins including the posterior tibial, peroneal and gastrocnemius veins when visible. The superficial great saphenous vein was also interrogated. Spectral Doppler was utilized to evaluate flow at rest and with distal augmentation maneuvers in the common femoral, femoral and popliteal veins. COMPARISON:  None Available. FINDINGS: RIGHT LOWER EXTREMITY Common Femoral Vein: No evidence of thrombus. Normal compressibility, respiratory phasicity and response to augmentation. Saphenofemoral Junction: No evidence of thrombus. Normal compressibility and flow on color Doppler imaging.  Profunda Femoral Vein: No evidence of thrombus. Normal compressibility and flow on color Doppler imaging. Femoral Vein: No evidence of thrombus. Normal compressibility, respiratory phasicity and response to augmentation. Popliteal Vein: No evidence of thrombus. Normal compressibility, respiratory phasicity and response to augmentation. Calf Veins: No evidence of thrombus. Normal compressibility and flow on color Doppler imaging. Superficial Great Saphenous Vein: No evidence of thrombus. Normal compressibility. Venous Reflux:  None. Other Findings:  None. LEFT LOWER EXTREMITY Common Femoral Vein: No evidence of thrombus. Normal compressibility, respiratory phasicity and response to augmentation. Saphenofemoral Junction: No evidence of thrombus. Normal compressibility and flow on color Doppler imaging. Profunda Femoral Vein: No evidence of thrombus. Normal compressibility and flow on color Doppler imaging. Femoral Vein: No evidence of thrombus. Normal compressibility, respiratory phasicity and response to augmentation. Popliteal Vein: No evidence of thrombus. Normal compressibility, respiratory phasicity and response to augmentation. Calf Veins: No evidence of thrombus. Normal compressibility and flow on color Doppler imaging. Superficial Great Saphenous Vein: No evidence of thrombus. Normal compressibility. Venous Reflux:  None. Other Findings:  None. IMPRESSION: No evidence of deep venous thrombosis in either lower extremity. Electronically Signed   By: Inez Catalina M.D.   On: 07/31/2022 19:29   US RENAL  Result Date: 07/31/2022 CLINICAL DATA:  [id number] AKI (acute kidney injury) ([place]) [id number] EXAM: RENAL / URINARY TRACT ULTRASOUND COMPLETE COMPARISON:  None Available. FINDINGS: Right Kidney: Renal measurements: 9.4 x 6.4 x 5.4 cm = volume: 169 mL. Echogenicity within normal limits. No mass. Mild to moderate hydronephrosis visualized. Left Kidney: Renal measurements: 9.4 x 6.1 x 5.5 cm = volume: 168 mL. Echogenicity  within normal limits. No mass. Mild to moderate hydronephrosis visualized. Urinary bladder: Appears normal for degree of bladder distention.  Unable to void. Other: Prostate measures 4.3 x 5.6 x 6.3 cm IMPRESSION: 1. Bilateral mild to moderate hydronephrosis. Prominent prostate gland. 2. Patient unable to void per ultrasound technician. Electronically Signed   By: Iven Finn M.D.   On: 07/31/2022 19:26   DG Chest Portable 1 View  Result Date: 07/31/2022 CLINICAL DATA:  Provided history: Sepsis. Recent syncopal episodes. History of prostate cancer. EXAM: PORTABLE CHEST 1 VIEW COMPARISON:  Head CT 06/14/2022. Prior chest radiographs 03/06/2018. FINDINGS:  Heart size within normal limits. No appreciable airspace consolidation. No evidence of pleural effusion or pneumothorax. Known metastases within the thoracic spine, bilateral ribs and left scapula (demonstrated on the prior PET CT of 06/14/2022). IMPRESSION: No evidence of an acute cardiopulmonary abnormality. Electronically Signed   By: Kellie Simmering D.O.   On: 07/31/2022 13:56        Scheduled Meds:  cholecalciferol  1,000 Units Oral Daily   cyanocobalamin  500 mcg Oral Daily   feeding supplement (GLUCERNA SHAKE)  237 mL Oral TID BM   insulin aspart  0-5 Units Subcutaneous QHS   insulin aspart  0-9 Units Subcutaneous TID WC   insulin glargine-yfgn  40 Units Subcutaneous QHS   omega-3 acid ethyl esters  1 g Oral Daily   pantoprazole  40 mg Oral Daily   pravastatin  40 mg Oral q1800   relugolix  120 mg Oral Daily   Continuous Infusions:   LOS: 1 day \  Sidney Ace, MD [facility]   If 7PM-7AM, please contact night-coverage  08/01/2022, 10:48 AM

## 2022-08-01 NOTE — Evaluation (Signed)
Occupational Therapy Evaluation Patient Details Name: Keith Howe MRN: [id number] DOB: [birth year] Today's Date: 08/01/2022   History of Present Illness Pt is an 82 y/o M admitted on 07/31/22 after presenting with c/o near syncope. Pt found to have worsening anemia. Pt is admitted for near syncope with etiology likely related to anemia in the setting of chemotherapy. Pt received 2 units of PRBC. PMH: prostate CA on chemotherapy, HTN, DM, HOH, obesity, CKD 3B   Clinical Impression   Keith Howe was seen for OT evaluation this date. Prior to hospital admission, pt was MOD I using QC. Pt lives with family in split level home. Pt presents to acute OT demonstrating impaired ADL performance and functional mobility 2/2 decreased activity tolerance and functional strength/ROM/balance deficits. Pt currently requires SUPERVISION + RW functional mobility, minor LOB reaching outside BOS self-corrects with UE support. MOD I don shoes seated EOB. Educated on ECS, no skilled acute OT needs identified. Upon hospital discharge, recommend no OT follow up.   Recommendations for follow up therapy are one component of a multi-disciplinary discharge planning process, led by the attending physician.  Recommendations may be updated based on patient status, additional functional criteria and insurance authorization.   Assistance Recommended at Discharge Set up Supervision/Assistance  Patient can return home with the following A little help with walking and/or transfers;A little help with bathing/dressing/bathroom;Help with stairs or ramp for entrance    Functional Status Assessment  Patient has had a recent decline in their functional status and demonstrates the ability to make significant improvements in function in a reasonable and predictable amount of time.  Equipment Recommendations  None recommended by OT    Recommendations for Other Services       Precautions / Restrictions Precautions Precautions:  Fall Restrictions Weight Bearing Restrictions: No      Mobility Bed Mobility Overal bed mobility: Modified Independent                  Transfers Overall transfer level: Needs assistance Equipment used: Rolling walker (2 wheels) Transfers: Sit to/from Stand Sit to Stand: Supervision                  Balance Overall balance assessment: Needs assistance Sitting-balance support: Feet supported, No upper extremity supported Sitting balance-Leahy Scale: Fair     Standing balance support: No upper extremity supported, During functional activity Standing balance-Leahy Scale: Fair                             ADL either performed or assessed with clinical judgement   ADL Overall ADL's : Needs assistance/impaired                                       General ADL Comments: SUPERVISION + RW functional mobility, minor LOB reaching outside BOS self-corrects with UE support. MOD I don shoes seated EOB      Pertinent Vitals/Pain Pain Assessment Pain Assessment: No/denies pain     Hand Dominance     Extremity/Trunk Assessment Upper Extremity Assessment Upper Extremity Assessment: Overall WFL for tasks assessed   Lower Extremity Assessment Lower Extremity Assessment: Generalized weakness       Communication Communication Communication: HOH   Cognition Arousal/Alertness: Awake/alert Behavior During Therapy: WFL for tasks assessed/performed Overall Cognitive Status: Within Functional Limits for tasks assessed  General Comments  Max HR seen 147 bpm            Home Living Family/patient expects to be discharged to:: Private residence Living Arrangements: Spouse/significant other Available Help at Discharge: Family Type of Home: House (split level home) Home Access: Level entry     Home Layout: Bed/bath upstairs;Multi-level Alternate Level Stairs-Number of Steps: 3 steps  without rails up to kitchen, 6 steps to bedroom with R rail, 6 steps down to basement             Home Equipment: [place] Walker (2 wheels)          Prior Functioning/Environment Prior Level of Function : Independent/Modified Independent             Mobility Comments: Pt reports he's ambulatory with QC, denies falls (reports the 2-3 syncopal episodes prior to admission)          OT Problem List: Decreased activity tolerance;Impaired balance (sitting and/or standing);Decreased safety awareness         OT Goals(Current goals can be found in the care plan section) Acute Rehab OT Goals Patient Stated Goal: to go home OT Goal Formulation: With patient/family Time For Goal Achievement: 08/15/22 Potential to Achieve Goals: Good   AM-PAC OT "6 Clicks" Daily Activity     Outcome Measure Help from another person eating meals?: None Help from another person taking care of personal grooming?: None Help from another person toileting, which includes using toliet, bedpan, or urinal?: A Little Help from another person bathing (including washing, rinsing, drying)?: None Help from another person to put on and taking off regular upper body clothing?: None Help from another person to put on and taking off regular lower body clothing?: A Little 6 Click Score: 22   End of Session    Activity Tolerance: Patient tolerated treatment well Patient left: in bed;with call bell/phone within reach;with family/visitor present  OT Visit Diagnosis: Unsteadiness on feet (R26.81);Other abnormalities of gait and mobility (R26.89)                Time: [id number] OT Time Calculation (min): 16 min Charges:  OT General Charges $OT Visit: 1 Visit OT Evaluation $OT Eval Low Complexity: 1 Low  Dessie Coma, M.S. OTR/L  08/01/22, 1:52 PM  ascom [phone]

## 2022-08-01 NOTE — Inpatient Diabetes Management (Signed)
Inpatient Diabetes Program Recommendations  AACE/ADA: New Consensus Statement on Inpatient Glycemic Control   Target Ranges:  Prepandial:   less than 140 mg/dL      Peak postprandial:   less than 180 mg/dL (1-2 hours)      Critically ill patients:  140 - 180 mg/dL    Latest Reference Range & Units 08/01/22 06:41  Glucose 70 - 99 mg/dL 48 (L)    Latest Reference Range & Units 07/31/22 19:02 07/31/22 20:04  Glucose-Capillary 70 - 99 mg/dL 178 (H) 157 (H)    Review of Glycemic Control  Diabetes history: DM2 Outpatient Diabetes medications: Farxiga 10 mg daily, Novolog 7 units TID with meals, Tresiba 80 units QAM, Trulicity 1.5 mg Qweek (not taking) Current orders for Inpatient glycemic control: Semglee 55 units daily, Novolog 0-9 units TID with meals, Novolog 0-5 units QHS  Inpatient Diabetes Program Recommendations:    Insulin:  Please consider decreasing Semglee to 40 units and change frequency to QHS since lab glucose low this morning.  NOTE: Lab glucose 48 mg/dl today at 6:41 am. Sent chat message to RN and NT about lab glucose and request finger stick glucose ASAP if not already done. Per home medication list, patient last took [name] 80 units on morning of 07/31/22.  Thanks, Barnie Alderman, RN, MSN, [place] [facility] Coordinator Inpatient Diabetes Program [phone] (Team Pager from 8am to [place])

## 2022-08-02 DIAGNOSIS — R55 Syncope and collapse: Secondary | ICD-10-CM | POA: Diagnosis not present

## 2022-08-02 LAB — TYPE AND SCREEN
ABO/RH(D): B POS
Antibody Screen: NEGATIVE
Unit division: 0
Unit division: 0

## 2022-08-02 LAB — URINALYSIS, ROUTINE W REFLEX MICROSCOPIC
Bilirubin Urine: NEGATIVE
Glucose, UA: 150 mg/dL — AB
Hgb urine dipstick: NEGATIVE
Ketones, ur: NEGATIVE mg/dL
Leukocytes,Ua: NEGATIVE
Nitrite: NEGATIVE
Protein, ur: NEGATIVE mg/dL
Specific Gravity, Urine: 1.011 (ref 1.005–1.030)
pH: 5 (ref 5.0–8.0)

## 2022-08-02 LAB — BPAM RBC
Blood Product Expiration Date: 202404152359
Blood Product Expiration Date: 202404152359
ISSUE DATE / TIME: 202403252344
ISSUE DATE / TIME: 202403260306
Unit Type and Rh: 7300
Unit Type and Rh: 7300

## 2022-08-02 LAB — CBC WITH DIFFERENTIAL/PLATELET
Abs Immature Granulocytes: 0.91 10*3/uL — ABNORMAL HIGH (ref 0.00–0.07)
Basophils Absolute: 0.1 10*3/uL (ref 0.0–0.1)
Basophils Relative: 0 %
Eosinophils Absolute: 0 10*3/uL (ref 0.0–0.5)
Eosinophils Relative: 0 %
HCT: 23.2 % — ABNORMAL LOW (ref 39.0–52.0)
Hemoglobin: 8 g/dL — ABNORMAL LOW (ref 13.0–17.0)
Immature Granulocytes: 5 %
Lymphocytes Relative: 3 %
Lymphs Abs: 0.5 10*3/uL — ABNORMAL LOW (ref 0.7–4.0)
MCH: 29.3 pg (ref 26.0–34.0)
MCHC: 34.5 g/dL (ref 30.0–36.0)
MCV: 85 fL (ref 80.0–100.0)
Monocytes Absolute: 1 10*3/uL (ref 0.1–1.0)
Monocytes Relative: 6 %
Neutro Abs: 14.9 10*3/uL — ABNORMAL HIGH (ref 1.7–7.7)
Neutrophils Relative %: 86 %
Platelets: 97 10*3/uL — ABNORMAL LOW (ref 150–400)
RBC: 2.73 MIL/uL — ABNORMAL LOW (ref 4.22–5.81)
RDW: 14.6 % (ref 11.5–15.5)
WBC: 17.4 10*3/uL — ABNORMAL HIGH (ref 4.0–10.5)
nRBC: 1.1 % — ABNORMAL HIGH (ref 0.0–0.2)

## 2022-08-02 LAB — GLUCOSE, CAPILLARY
Glucose-Capillary: 98 mg/dL (ref 70–99)
Glucose-Capillary: 87 mg/dL (ref 70–99)

## 2022-08-02 NOTE — TOC Transition Note (Signed)
Transition of Care [facility]) - CM/SW Discharge Note   Patient Details  Name: Keith Howe MRN: [id number] Date of Birth: 1940/08/16  Transition of Care [employer]) CM/SW Contact:  Candie Chroman, LCSW Phone Number: 08/02/2022, 11:24 AM   Clinical Narrative:   Patient has orders to discharge home today. CSW met with patient. Wife and son at bedside. CSW introduced role and explained that PT recommendations would be discussed. They are agreeable to home health. No agency preference. [facility] has accepted referral and can start services tomorrow or Friday. Wife is aware. No DME recommendations. No further concerns. CSW signing off.  Final next level of care: Home w Home Health Services Barriers to Discharge: No Barriers Identified   Patient Goals and CMS Choice CMS Medicare.gov Compare Post Acute Care list provided to:: Patient Represenative (must comment) (Wife at bedside) Choice offered to / list presented to : Spouse  Discharge Placement                  Patient to be transferred to facility by: Family Name of family member notified: Izora Gala Patient and family notified of of transfer: 08/02/22  Discharge Plan and Services Additional resources added to the After Visit Summary for                            [facility] Arranged: PT [facility] Agency: [place] Date [place]: 08/02/22   Representative spoke with at [place]: [place] Determinants of Health ([place]) Interventions SDOH Screenings   Food Insecurity: No Food Insecurity (07/31/2022)  Housing: Low Risk  (07/31/2022)  Transportation Needs: No Transportation Needs (07/31/2022)  Utilities: Not At Risk (07/31/2022)  Alcohol Screen: Low Risk  (05/12/2021)  Depression (PHQ2-9): Low Risk  (08/29/2021)  Financial Resource Strain: Low Risk  (05/12/2021)  Physical Activity: Insufficiently Active (05/12/2021)  Social Connections: Socially Integrated (05/12/2021)  Stress: No Stress Concern Present  (05/12/2021)  Tobacco Use: Low Risk  (07/31/2022)     Readmission Risk Interventions     No data to display

## 2022-08-02 NOTE — Discharge Summary (Signed)
Physician Discharge Summary  Keith Howe [id number] DOB: [birth year] DOA: 07/31/2022  PCP: Earlie Server, MD  Admit date: 07/31/2022 Discharge date: 08/02/2022  Admitted From: Home Disposition:  Home with home health  Recommendations for Outpatient Follow-up:  Follow up with PCP in 1-2 weeks Follow up with Dr. Tasia Catchings in cancer center 1 week  Home Health:Yes PT Equipment/Devices:None   Discharge Condition:Stable  CODE STATUS:FULL  Diet recommendation: Carb mod  Brief/Interim Summary: 82 y.o. male with medical history significant of prostate cancer on chemotherapy, hypertension, diabetes mellitus, hard of hearing, obesity, CKD-3B, who presents with near syncope.   Patient states that he had 2 episode of near syncope.  First episode happened Saturday when he was using bathroom.  He felt lightheaded, almost passed out, but did not have LOC.  It happened again yesterday.  Patient denies unilateral numbness or tinglings in extremities.  No facial droop or slurred speech.  No fall or injury.  Patient denies chest pain, cough, shortness breath.  No fever or chills.  Patient was found to have worsening anemia, but strongly denies dark stool or rectal bleeding.  Patient has nausea, no vomiting, diarrhea or abdominal pain.  I asked him several times if he had any dark stool or rectal bleeding, he denied.   3/26: Evaluated patient with GI consultant.  Discussed with oncology.  No evidence of acute blood loss.  Iron indices do not indicate this.  No plans for endoscopic evaluation.  Per oncology patient did receive GM-CSF and is likely driving leukocytosis.  No evidence of infection.  3/27: Patient stable.  Hemoglobin 8.0 no clear signs of blood loss.  Leukocytosis downtrending.  No plans for endoscopic evaluation.  Discussed case with oncology.  White blood cell count was as expected given recent administration of GM-CSF.  For for discharge home at this time.  Home health services physical therapy  ordered.  Patient will follow-up with primary care within 1 to 2 weeks.  Follow-up with Dr. Tasia Catchings of oncology and cancer center in 1 week.  Repeat lab work to be ordered at the cancer center.      Discharge Diagnoses:  Principal Problem:   Near syncope Active Problems:   Normocytic anemia   SIRS (systemic inflammatory response syndrome) (HCC)   Essential hypertension   Acute renal failure superimposed on stage 3b chronic kidney disease (HCC)   Prostate cancer (HCC)   Type II diabetes mellitus with renal manifestations (HCC)   Myocardial injury   Bilateral leg edema   Obesity (BMI 30-39.9)  Near syncope:  Etiology likely related to anemia in the setting of chemotherapy.  Do not suspect acute bleed.  No evidence of infection.  No focal deficit on exam.  Low suspicion for CVA. Plan: Stable at time of discharge.  Home health PT ordered.  Normocytic anemia Hgb 11.9 on 07/19/22 --> 7.8 --> 6.9.  Patient denies dark stool, rectal bleeding, but hemoglobin dropped significantly.  Iron indices do not suggest acute blood loss.  Case discussed with GI.  No plans for endoscopic evaluation.  Patient status post 2 units PRBC transfusion.  Hemoglobin responded appropriately. Plan: No indication for transfusion at this time.  Hemoglobin 8.  Stable for discharge.  Follow-up outpatient cancer center 1 week for repeat lab work   SIRS (systemic inflammatory response syndrome) ([place]) pt meets criteria for SIRS with WBC 36.0, heart rate up to 106, RR 21.  Lactic acid 3.5.  No clear source of infection.  Chest x-ray negative, urinalysis negative.  Blood and urine cultures drawn on admission.  These are pending but negative to date.  Procalcitonin not indicative of acute infection.   Plan:  Suspect SIRS criteria driven by anemia in the setting of chemotherapy.  No indication for antibiotics at this time.  Leukocytosis downtrending   Essential hypertension Can restart home regimen   Acute renal failure  superimposed on stage 3b chronic kidney disease ([place]) May be due to volume depletion and continuation of Cozaar..  Also secondary to chemotherapy.  Renal ultrasound negative.  Urinalysis clear. Plan: Can restart home   Prostate cancer [facility])  Metastasized prostate cancer.  Patient is on chemotherapy.  Currently on relugolix Plan: Continue relugolix Case discussed with Dr. Tasia Catchings   Type II diabetes mellitus with renal manifestations ([place])  Recent A1c 5.6, well-controlled.  Patient taking NovoLog, Darel Hong, Tresiba 80 units daily Hypoglycemia noted Plan: Resume home regimen    Discharge Instructions  Discharge Instructions     Diet - low sodium heart healthy   Complete by: As directed    Increase activity slowly   Complete by: As directed       Allergies as of 08/02/2022   No Known Allergies      Medication List     STOP taking these medications    dutasteride 0.5 MG capsule Commonly known as: AVODART   Trulicity 1.5 0000000 Sopn Generic drug: Dulaglutide   Xtandi 40 MG capsule Generic drug: enzalutamide       TAKE these medications    amLODipine 5 MG tablet Commonly known as: NORVASC Take 2.5 mg by mouth daily.   aspirin EC 81 MG tablet Take 81 mg by mouth daily.   cholecalciferol 1000 units tablet Commonly known as: VITAMIN D Take 1,000 Units by mouth daily.   cyanocobalamin 500 MCG tablet Commonly known as: VITAMIN B12 Take 500 mcg by mouth daily.   dexamethasone 4 MG tablet Commonly known as: DECADRON Take 2 tabs by mouth daily starting the day before chemo. Then take 2 tabs daily for 2 days starting day after chemo. Take with food.   Farxiga 10 MG Tabs tablet Generic drug: dapagliflozin propanediol Take by mouth daily.   FISH OIL PO Take 1 capsule by mouth daily.   loratadine 10 MG tablet Commonly known as: CLARITIN Take 10 mg by mouth once daily before chemo and at least 2 days after injection.   losartan 100 MG tablet Commonly known  as: COZAAR Take 100 mg by mouth daily.   lovastatin 40 MG tablet Commonly known as: MEVACOR TAKE 1 TABLET BY MOUTH EVERY DAY   metoprolol tartrate 50 MG tablet Commonly known as: LOPRESSOR TAKE 1 TABLET BY MOUTH EVERY 12 HOURS   NovoLOG FlexPen 100 UNIT/ML FlexPen Generic drug: insulin aspart Inject 7 Units into the skin 3 (three) times daily before meals.   ondansetron 8 MG tablet Commonly known as: Zofran Take 1 tablet (8 mg total) by mouth every 8 (eight) hours as needed for nausea or vomiting.   Orgovyx 120 MG tablet Generic drug: relugolix Take 1 tablet (120 mg total) by mouth daily.   prochlorperazine 10 MG tablet Commonly known as: COMPAZINE Take 1 tablet (10 mg total) by mouth every 6 (six) hours as needed for nausea or vomiting.   PROSTATE SUPPORT PO Take 2 capsules by mouth daily.   [name] FlexTouch 200 UNIT/ML FlexTouch Pen Generic drug: insulin degludec Inject 80 Units into the muscle daily before breakfast.        Follow-up Information  Cletis Athens, MD. Schedule an appointment as soon as possible for a visit in 1 week(s).   Specialties: Internal Medicine, Cardiology Contact information: [place] [address] [phone]         Earlie Server, MD. Go on 08/09/2022.   Specialty: Oncology Contact information: [place] [address] [phone]                No Known Allergies  Consultations: GI   Procedures/Studies: US Venous Img Lower Bilateral (DVT)  Result Date: 07/31/2022 CLINICAL DATA:  Acute renal injury with shortness of breath, initial encounter EXAM: BILATERAL LOWER EXTREMITY VENOUS DOPPLER ULTRASOUND TECHNIQUE: Gray-scale sonography with graded compression, as well as color Doppler and duplex ultrasound were performed to evaluate the lower extremity deep venous systems from the level of the common femoral vein and including the common femoral, femoral, profunda femoral, popliteal and calf  veins including the posterior tibial, peroneal and gastrocnemius veins when visible. The superficial great saphenous vein was also interrogated. Spectral Doppler was utilized to evaluate flow at rest and with distal augmentation maneuvers in the common femoral, femoral and popliteal veins. COMPARISON:  None Available. FINDINGS: RIGHT LOWER EXTREMITY Common Femoral Vein: No evidence of thrombus. Normal compressibility, respiratory phasicity and response to augmentation. Saphenofemoral Junction: No evidence of thrombus. Normal compressibility and flow on color Doppler imaging. Profunda Femoral Vein: No evidence of thrombus. Normal compressibility and flow on color Doppler imaging. Femoral Vein: No evidence of thrombus. Normal compressibility, respiratory phasicity and response to augmentation. Popliteal Vein: No evidence of thrombus. Normal compressibility, respiratory phasicity and response to augmentation. Calf Veins: No evidence of thrombus. Normal compressibility and flow on color Doppler imaging. Superficial Great Saphenous Vein: No evidence of thrombus. Normal compressibility. Venous Reflux:  None. Other Findings:  None. LEFT LOWER EXTREMITY Common Femoral Vein: No evidence of thrombus. Normal compressibility, respiratory phasicity and response to augmentation. Saphenofemoral Junction: No evidence of thrombus. Normal compressibility and flow on color Doppler imaging. Profunda Femoral Vein: No evidence of thrombus. Normal compressibility and flow on color Doppler imaging. Femoral Vein: No evidence of thrombus. Normal compressibility, respiratory phasicity and response to augmentation. Popliteal Vein: No evidence of thrombus. Normal compressibility, respiratory phasicity and response to augmentation. Calf Veins: No evidence of thrombus. Normal compressibility and flow on color Doppler imaging. Superficial Great Saphenous Vein: No evidence of thrombus. Normal compressibility. Venous Reflux:  None. Other Findings:   None. IMPRESSION: No evidence of deep venous thrombosis in either lower extremity. Electronically Signed   By: Inez Catalina M.D.   On: 07/31/2022 19:29   US RENAL  Result Date: 07/31/2022 CLINICAL DATA:  [id number] AKI (acute kidney injury) ([place]) [id number] EXAM: RENAL / URINARY TRACT ULTRASOUND COMPLETE COMPARISON:  None Available. FINDINGS: Right Kidney: Renal measurements: 9.4 x 6.4 x 5.4 cm = volume: 169 mL. Echogenicity within normal limits. No mass. Mild to moderate hydronephrosis visualized. Left Kidney: Renal measurements: 9.4 x 6.1 x 5.5 cm = volume: 168 mL. Echogenicity within normal limits. No mass. Mild to moderate hydronephrosis visualized. Urinary bladder: Appears normal for degree of bladder distention.  Unable to void. Other: Prostate measures 4.3 x 5.6 x 6.3 cm IMPRESSION: 1. Bilateral mild to moderate hydronephrosis. Prominent prostate gland. 2. Patient unable to void per ultrasound technician. Electronically Signed   By: Keith Finn M.D.   On: 07/31/2022 19:26   DG Chest Portable 1 View  Result Date: 07/31/2022 CLINICAL DATA:  Provided history: Sepsis. Recent syncopal episodes. History of prostate cancer.  EXAM: PORTABLE CHEST 1 VIEW COMPARISON:  Head CT 06/14/2022. Prior chest radiographs 03/06/2018. FINDINGS: Heart size within normal limits. No appreciable airspace consolidation. No evidence of pleural effusion or pneumothorax. Known metastases within the thoracic spine, bilateral ribs and left scapula (demonstrated on the prior PET CT of 06/14/2022). IMPRESSION: No evidence of an acute cardiopulmonary abnormality. Electronically Signed   By: Kellie Simmering D.O.   On: 07/31/2022 13:56      Subjective: Seen and examined on the day of discharge.  Stable no distress.  Wife at bedside.  Patient appropriate for discharge home with home health services.  Discharge Exam: Vitals:   08/02/22 0440 08/02/22 0829  BP: 133/61 (!) 141/64  Pulse: 88 93  Resp: 18 18  Temp: 98 F (36.7 C) 98 F  (36.7 C)  SpO2: 98% 100%   Vitals:   08/01/22 2005 08/02/22 0006 08/02/22 0440 08/02/22 0829  BP: 133/67 129/66 133/61 (!) 141/64  Pulse: 100 77 88 93  Resp: 17 17 18 18   Temp: (!) 97.5 F (36.4 C) 98.8 F (37.1 C) 98 F (36.7 C) 98 F (36.7 C)  TempSrc:  Oral Oral   SpO2: 100% 100% 98% 100%  Weight:      Height:        General: Pt is alert, awake, not in acute distress Cardiovascular: RRR, S1/S2 +, no rubs, no gallops Respiratory: CTA bilaterally, no wheezing, no rhonchi Abdominal: Soft, NT, ND, bowel sounds + Extremities: no edema, no cyanosis    The results of significant diagnostics from this hospitalization (including imaging, microbiology, ancillary and laboratory) are listed below for reference.     Microbiology: Recent Results (from the past 240 hour(s))  SARS Coronavirus 2 by RT PCR (hospital order, performed in [facility] hospital lab) *cepheid single result test* Anterior Nasal Swab     Status: None   Collection Time: 07/31/22 12:23 PM   Specimen: Anterior Nasal Swab  Result Value Ref Range Status   SARS Coronavirus 2 by RT PCR NEGATIVE NEGATIVE Final    Comment: (NOTE) SARS-CoV-2 target nucleic acids are NOT DETECTED.  The SARS-CoV-2 RNA is generally detectable in upper and lower respiratory specimens during the acute phase of infection. The lowest concentration of SARS-CoV-2 viral copies this assay can detect is 250 copies / mL. A negative result does not preclude SARS-CoV-2 infection and should not be used as the sole basis for treatment or other patient management decisions.  A negative result may occur with improper specimen collection / handling, submission of specimen other than nasopharyngeal swab, presence of viral mutation(s) within the areas targeted by this assay, and inadequate number of viral copies (<250 copies / mL). A negative result must be combined with clinical observations, patient history, and epidemiological information.  Fact  Sheet for Patients:   https://www.patel.info/  Fact Sheet for Healthcare Providers: https://hall.com/  This test is not yet approved or  cleared by the [place] FDA and has been authorized for detection and/or diagnosis of SARS-CoV-2 by FDA under an Emergency Use Authorization (EUA).  This EUA will remain in effect (meaning this test can be used) for the duration of the COVID-19 declaration under Section 564(b)(1) of the Act, 21 U.S.C. section 360bbb-3(b)(1), unless the authorization is terminated or revoked sooner.  Performed at [facility], [address] [place], [place] [address]   Blood culture (routine x 2)     Status: None (Preliminary result)   Collection Time: 07/31/22 12:50 PM   Specimen: BLOOD  Result Value Ref Range Status   Specimen Description BLOOD LEFT ANTECUBITAL  Final   Special Requests   Final    BOTTLES DRAWN AEROBIC AND ANAEROBIC Blood Culture adequate volume   Culture   Final    NO GROWTH 2 DAYS Performed at [facility], [address], [place], [place] [address]    Report Status PENDING  Incomplete  Blood culture (routine x 2)     Status: None (Preliminary result)   Collection Time: 07/31/22 12:50 PM   Specimen: BLOOD  Result Value Ref Range Status   Specimen Description BLOOD BLOOD LEFT FOREARM  Final   Special Requests   Final    BOTTLES DRAWN AEROBIC AND ANAEROBIC Blood Culture adequate volume   Culture   Final    NO GROWTH 2 DAYS Performed at [facility], [address] [place], [place] [address]    Report Status PENDING  Incomplete     Labs: BNP (last 3 results) Recent Labs    07/31/22 1209  BNP [id number]*   Basic Metabolic Panel: Recent Labs  Lab 07/31/22 1209 08/01/22 0641  NA 135 138  K 4.1 3.4*  CL 102 108  CO2 22 23  GLUCOSE 221* 48*  BUN 90* 75*  CREATININE 2.51* 2.12*  CALCIUM 8.6* 8.1*   Liver Function Tests: Recent Labs  Lab  07/31/22 1209  AST 18  ALT 15  ALKPHOS 113  BILITOT 0.5  PROT 5.7*  ALBUMIN 3.2*   No results for input(s): "LIPASE", "AMYLASE" in the last 168 hours. No results for input(s): "AMMONIA" in the last 168 hours. CBC: Recent Labs  Lab 07/31/22 1209 07/31/22 1623 07/31/22 1915 08/01/22 0641 08/02/22 0404  WBC 36.0* 37.6* 35.1* 27.6* 17.4*  NEUTROABS 31.0*  --   --   --  14.9*  HGB 7.8* 6.9* 6.9* 9.1* 8.0*  HCT 23.1* 20.6* 20.6* 26.1* 23.2*  MCV 86.2 85.5 85.5 83.7 85.0  PLT 163 144* 145* 122* 97*   Cardiac Enzymes: No results for input(s): "CKTOTAL", "CKMB", "CKMBINDEX", "TROPONINI" in the last 168 hours. BNP: Invalid input(s): "POCBNP" CBG: Recent Labs  Lab 08/01/22 0901 08/01/22 1257 08/01/22 1648 08/01/22 2006 08/02/22 0758  GLUCAP 44* 159* 158* 129* 98   D-Dimer No results for input(s): "DDIMER" in the last 72 hours. Hgb A1c Recent Labs    07/31/22 1623  HGBA1C 6.8*   Lipid Profile Recent Labs    07/31/22 1623  CHOL 101  HDL 15*  LDLCALC 43  TRIG 217*  CHOLHDL 6.7   Thyroid function studies No results for input(s): "TSH", "T4TOTAL", "T3FREE", "THYROIDAB" in the last 72 hours.  Invalid input(s): "FREET3" Anemia work up Recent Labs    07/31/22 1623  VITAMINB12 >7,500*  FOLATE 13.1  FERRITIN 488*  TIBC 238*  IRON 117  RETICCTPCT 3.6*   Urinalysis    Component Value Date/Time   COLORURINE YELLOW (A) 03/22/2018 1631   APPEARANCEUR HAZY (A) 03/22/2018 1631   LABSPEC 1.013 03/22/2018 1631   PHURINE 5.0 03/22/2018 1631   GLUCOSEU NEGATIVE 03/22/2018 1631   HGBUR MODERATE (A) 03/22/2018 1631   BILIRUBINUR NEGATIVE 03/22/2018 [place] 03/22/2018 1631   PROTEINUR 30 (A) 03/22/2018 1631   NITRITE NEGATIVE 03/22/2018 1631   LEUKOCYTESUR TRACE (A) 03/22/2018 1631   Sepsis Labs Recent Labs  Lab 07/31/22 1623 07/31/22 1915 08/01/22 0641 08/02/22 0404  WBC 37.6* 35.1* 27.6* 17.4*   Microbiology Recent Results (from the  past 240 hour(s))  SARS Coronavirus 2  by RT PCR (hospital order, performed in [facility] hospital lab) *cepheid single result test* Anterior Nasal Swab     Status: None   Collection Time: 07/31/22 12:23 PM   Specimen: Anterior Nasal Swab  Result Value Ref Range Status   SARS Coronavirus 2 by RT PCR NEGATIVE NEGATIVE Final    Comment: (NOTE) SARS-CoV-2 target nucleic acids are NOT DETECTED.  The SARS-CoV-2 RNA is generally detectable in upper and lower respiratory specimens during the acute phase of infection. The lowest concentration of SARS-CoV-2 viral copies this assay can detect is 250 copies / mL. A negative result does not preclude SARS-CoV-2 infection and should not be used as the sole basis for treatment or other patient management decisions.  A negative result may occur with improper specimen collection / handling, submission of specimen other than nasopharyngeal swab, presence of viral mutation(s) within the areas targeted by this assay, and inadequate number of viral copies (<250 copies / mL). A negative result must be combined with clinical observations, patient history, and epidemiological information.  Fact Sheet for Patients:   https://www.patel.info/  Fact Sheet for Healthcare Providers: https://hall.com/  This test is not yet approved or  cleared by the [place] FDA and has been authorized for detection and/or diagnosis of SARS-CoV-2 by FDA under an Emergency Use Authorization (EUA).  This EUA will remain in effect (meaning this test can be used) for the duration of the COVID-19 declaration under Section 564(b)(1) of the Act, 21 U.S.C. section 360bbb-3(b)(1), unless the authorization is terminated or revoked sooner.  Performed at [facility], [address] [place], [place] [address]   Blood culture (routine x 2)     Status: None (Preliminary result)   Collection Time: 07/31/22 12:50 PM   Specimen:  BLOOD  Result Value Ref Range Status   Specimen Description BLOOD LEFT ANTECUBITAL  Final   Special Requests   Final    BOTTLES DRAWN AEROBIC AND ANAEROBIC Blood Culture adequate volume   Culture   Final    NO GROWTH 2 DAYS Performed at [facility], [address], [place], [place] [address]    Report Status PENDING  Incomplete  Blood culture (routine x 2)     Status: None (Preliminary result)   Collection Time: 07/31/22 12:50 PM   Specimen: BLOOD  Result Value Ref Range Status   Specimen Description BLOOD BLOOD LEFT FOREARM  Final   Special Requests   Final    BOTTLES DRAWN AEROBIC AND ANAEROBIC Blood Culture adequate volume   Culture   Final    NO GROWTH 2 DAYS Performed at [facility], [address] [place], [place] [address]    Report Status PENDING  Incomplete     Time coordinating discharge: Over 30 minutes  SIGNED:   Sidney Ace, MD  [facility] 08/02/2022, 11:00 AM Pager   If 7PM-7AM, please contact night-coverage

## 2022-08-03 LAB — URINE CULTURE: Culture: NO GROWTH

## 2022-08-04 ENCOUNTER — Telehealth: Payer: Self-pay | Admitting: *Deleted

## 2022-08-04 NOTE — Telephone Encounter (Signed)
Call from [facility] with [facility] asking for order approval fro Physical Therapy 1 wk 1, 2 wk 4, 1 wk 4

## 2022-08-05 LAB — CULTURE, BLOOD (ROUTINE X 2)
Culture: NO GROWTH
Culture: NO GROWTH
Special Requests: ADEQUATE
Special Requests: ADEQUATE

## 2022-08-08 ENCOUNTER — Other Ambulatory Visit: Payer: Self-pay

## 2022-08-08 ENCOUNTER — Encounter: Payer: Self-pay | Admitting: Oncology

## 2022-08-08 MED FILL — Dexamethasone Sodium Phosphate Inj 100 MG/10ML: INTRAMUSCULAR | Qty: 1 | Status: AC

## 2022-08-08 NOTE — Telephone Encounter (Signed)
Verbal order called and left on confidential voice mail

## 2022-08-08 NOTE — Telephone Encounter (Signed)
Ok to approve per MD

## 2022-08-09 ENCOUNTER — Encounter: Payer: Self-pay | Admitting: Oncology

## 2022-08-09 ENCOUNTER — Inpatient Hospital Stay: Payer: Medicare HMO | Attending: Oncology

## 2022-08-09 ENCOUNTER — Inpatient Hospital Stay (HOSPITAL_BASED_OUTPATIENT_CLINIC_OR_DEPARTMENT_OTHER): Payer: Medicare HMO | Admitting: Oncology

## 2022-08-09 ENCOUNTER — Inpatient Hospital Stay: Payer: Medicare HMO

## 2022-08-09 VITALS — BP 141/87 | HR 83 | Temp 95.5°F | Resp 18 | Wt 229.4 lb

## 2022-08-09 DIAGNOSIS — D701 Agranulocytosis secondary to cancer chemotherapy: Secondary | ICD-10-CM | POA: Insufficient documentation

## 2022-08-09 DIAGNOSIS — D631 Anemia in chronic kidney disease: Secondary | ICD-10-CM | POA: Diagnosis not present

## 2022-08-09 DIAGNOSIS — N1832 Chronic kidney disease, stage 3b: Secondary | ICD-10-CM

## 2022-08-09 DIAGNOSIS — R634 Abnormal weight loss: Secondary | ICD-10-CM | POA: Insufficient documentation

## 2022-08-09 DIAGNOSIS — Z5111 Encounter for antineoplastic chemotherapy: Secondary | ICD-10-CM | POA: Insufficient documentation

## 2022-08-09 DIAGNOSIS — N179 Acute kidney failure, unspecified: Secondary | ICD-10-CM

## 2022-08-09 DIAGNOSIS — C61 Malignant neoplasm of prostate: Secondary | ICD-10-CM

## 2022-08-09 DIAGNOSIS — C7951 Secondary malignant neoplasm of bone: Secondary | ICD-10-CM | POA: Insufficient documentation

## 2022-08-09 DIAGNOSIS — T451X5A Adverse effect of antineoplastic and immunosuppressive drugs, initial encounter: Secondary | ICD-10-CM

## 2022-08-09 LAB — CBC WITH DIFFERENTIAL/PLATELET
Abs Immature Granulocytes: 0.04 10*3/uL (ref 0.00–0.07)
Basophils Absolute: 0 10*3/uL (ref 0.0–0.1)
Basophils Relative: 1 %
Eosinophils Absolute: 0 10*3/uL (ref 0.0–0.5)
Eosinophils Relative: 1 %
HCT: 27.8 % — ABNORMAL LOW (ref 39.0–52.0)
Hemoglobin: 9.2 g/dL — ABNORMAL LOW (ref 13.0–17.0)
Immature Granulocytes: 1 %
Lymphocytes Relative: 4 %
Lymphs Abs: 0.4 10*3/uL — ABNORMAL LOW (ref 0.7–4.0)
MCH: 29.9 pg (ref 26.0–34.0)
MCHC: 33.1 g/dL (ref 30.0–36.0)
MCV: 90.3 fL (ref 80.0–100.0)
Monocytes Absolute: 0.7 10*3/uL (ref 0.1–1.0)
Monocytes Relative: 9 %
Neutro Abs: 7.2 10*3/uL (ref 1.7–7.7)
Neutrophils Relative %: 84 %
Platelets: 331 10*3/uL (ref 150–400)
RBC: 3.08 MIL/uL — ABNORMAL LOW (ref 4.22–5.81)
RDW: 16.4 % — ABNORMAL HIGH (ref 11.5–15.5)
WBC: 8.4 10*3/uL (ref 4.0–10.5)
nRBC: 0 % (ref 0.0–0.2)

## 2022-08-09 LAB — COMPREHENSIVE METABOLIC PANEL
ALT: 17 U/L (ref 0–44)
AST: 20 U/L (ref 15–41)
Albumin: 3.5 g/dL (ref 3.5–5.0)
Alkaline Phosphatase: 68 U/L (ref 38–126)
Anion gap: 10 (ref 5–15)
BUN: 20 mg/dL (ref 8–23)
CO2: 24 mmol/L (ref 22–32)
Calcium: 8.8 mg/dL — ABNORMAL LOW (ref 8.9–10.3)
Chloride: 103 mmol/L (ref 98–111)
Creatinine, Ser: 1.31 mg/dL — ABNORMAL HIGH (ref 0.61–1.24)
GFR, Estimated: 55 mL/min — ABNORMAL LOW (ref >60)
Glucose, Bld: 131 mg/dL — ABNORMAL HIGH (ref 70–99)
Potassium: 3.4 mmol/L — ABNORMAL LOW (ref 3.5–5.1)
Sodium: 137 mmol/L (ref 135–145)
Total Bilirubin: 0.3 mg/dL (ref 0.3–1.2)
Total Protein: 6.4 g/dL — ABNORMAL LOW (ref 6.5–8.1)

## 2022-08-09 LAB — PSA: Prostatic Specific Antigen: 4.31 ng/mL — ABNORMAL HIGH (ref 0.00–4.00)

## 2022-08-09 NOTE — Assessment & Plan Note (Signed)
Patient gets G-CSF on D3 

## 2022-08-09 NOTE — Assessment & Plan Note (Signed)
Prostate cancer, Gleason score 10, s/p RT[2022]- biochemical recurrence 06/30/2021 on Docetaxel Q3weeks x 6 cycles with D3 GCSF Labs are reviewed and discussed with patient and wife.  Hold off chemotherapy to allow better recovery PSA has responded well.  Return in 2 weeks for next cycle  Docetaxel 75mg /m2 with GCSF support.   He is on  Orgovyx 120mg  daily, cost is prohibitive. Plan to switch to Eligard 45mg  Q6 months.

## 2022-08-09 NOTE — Assessment & Plan Note (Signed)
Resolved, Creatinine is close to baseline.  Encourage oral hydration and avoid nephrotoxins.

## 2022-08-09 NOTE — Progress Notes (Signed)
Hematology/Oncology Progress note [phone] [phone]      CHIEF COMPLAINTS/REASON FOR VISIT:  Metastatic prostate cancer  ASSESSMENT & PLAN:   Cancer Staging  Prostate cancer Staging form: Prostate, AJCC 8th Edition - Clinical: Stage Unknown (rcTX, cNX) - Signed by Earlie Server, MD on 07/13/2021 - Pathologic stage from 06/16/2022: Stage IVB (pNX, cM1b, Grade Group: 5) - Signed by Earlie Server, MD on 06/16/2022   Prostate cancer [facility]) Prostate cancer, Gleason score 10, s/p RT[2022]- biochemical recurrence 06/30/2021 on Docetaxel Q3weeks x 6 cycles with D3 GCSF Labs are reviewed and discussed with patient and wife.  Hold off chemotherapy to allow better recovery PSA has responded well.  Return in 2 weeks for next cycle  Docetaxel 75mg /m2 with GCSF support.   He is on  Orgovyx 120mg  daily, cost is prohibitive. Plan to switch to Eligard 45mg  Q6 months.   Chemotherapy induced neutropenia ([place]) Patient gets G-CSF on D3  Acute renal failure superimposed on stage 3b chronic kidney disease Resolved, Creatinine is close to baseline.  Encourage oral hydration and avoid nephrotoxins.    Weight loss Refer to dietitian    Orders Placed This Encounter  Procedures   PSA    Standing Status:   Future    Standing Expiration Date:   08/23/2023   CBC with Differential    Standing Status:   Future    Standing Expiration Date:   08/23/2023   Comprehensive metabolic panel    Standing Status:   Future    Standing Expiration Date:   08/23/2023   Ambulatory Referral to [facility] Nutrition    Referral Priority:   Routine    Referral Type:   Consultation    Referral Reason:   Specialty Services Required    Number of Visits Requested:   1    Follow up  2 weeks lab MD Docetaxel D3 GCSF  All questions were answered. The patient knows to call the clinic with any problems, questions or concerns.  Earlie Server, MD, PhD [facility] Hematology Oncology 08/09/2022   HISTORY OF PRESENTING  ILLNESS:   Keith Howe is a  82 y.o.  male presents for follow up for prostate cancer Oncology History  Prostate cancer  02/24/2020 Tumor Marker   PSA at Dr..Wolff's office 5.7.Marland Kitchen   03/17/2020 Imaging    MRI of the prostate showed PI-RADS category 5 lesion of the left posterolateral and posteromedial peripheral zone in the base, mid gland, and apex, with involvement of the left central zone at the base. This lesion bulges the prosthetic capsular margin adjacent to the rectum, and there is a high suspicion for extraprostatic spread and early seminal vesicle invasion and likely left neurovascular bundle involvement. PI-RADS category 2 lesion in the right peripheral zone,considered PI-RADS category 2 given the nonfocal nature.. Encapsulated nodularity in the transition zone compatible with benign prostatic hypertrophy. Prostate volume 71.50 cubic cm.    04/08/2020 Initial Diagnosis   Prostate cancer ([place]) prostate cancer history dated back to 04/08/20.  Prostate biopsy showed prostate adenocarcinoma, 12 out of 12 cores involved.  Gleason score 10 [5+5] 2 cores, Gleason 9 [5+4] 1 core, Gleason 9 [4+5] involving 8 cores.     05/13/2020 Imaging   CT abdomen pelvis without contrast showed moderately enlarged prostate, no evidence of abdominal or pelvic metastatic disease.    05/13/2020 Imaging   bone scan whole body showed no evidence of osseous metastasis    06/04/2020 -  Radiation Therapy   patient was seen by  Dr. Baruch Gouty and underwent IMRT to prostate and pelvic nodes.  Dr. Baruch Gouty asked Dr. Yves Dill to give adjuvant deprivation therapy.    12/29/2020 Tumor Marker   PSA 0.07   06/22/2021 -  Chemotherapy   Orgovyx 120mg  daily Xtandi 160mg  daily   07/01/2021 Tumor Marker   PSA 4.3 Patient was referred by Dr. Baruch Gouty to reestablish care with oncology for evaluation.  He was previously seen by me in the hematology clinic for abnormal SPEP and lost follow-up.  Oncology was not involved previously  for treatment of prostate cancer.    07/13/2021 Cancer Staging   Staging form: Prostate, AJCC 8th Edition - Clinical: Stage Unknown (rcTX, cNX) - Signed by Earlie Server, MD on 07/13/2021 Stage prefix: Recurrence   10/04/2021 Tumor Marker   PSA at Urologist office  <0.1   01/02/2022 Tumor Marker   PSA at urologist office 0.5   05/26/2022 Tumor Marker   PSA 9.59  Previous prostate cancer treatment was with Dr. Yves Dill.  05/26/2022  Patient presented to reestablish care as Dr. Yves Dill retired   06/15/2022 Imaging   PMSA showed Several radiotracer avid bone metastases in the left scapula, thoracic spine, and bilateral ribs. No evidence of soft tissue metastatic disease. Stable enlarged prostate, with findings of chronic bladder outlet obstruction and vesicoureteral reflux. Aortic Atherosclerosis     06/16/2022 Cancer Staging   Staging form: Prostate, AJCC 8th Edition - Pathologic stage from 06/16/2022: Stage IVB (pNX, cM1b, Grade Group: 5) - Signed by Earlie Server, MD on 06/16/2022 Histologic grading system: 5 grade system   06/16/2022 Tumor Marker   PSA  21.45   06/28/2022 -  Chemotherapy   Patient is on Treatment Plan : PROSTATE Docetaxel (75)     07/31/2022 - 08/02/2022 Hospital Admission   Admitted due to near syncope, Hb 6.9, s/p PRBC transfusion x 2 units.  No bleeding history. SIRS, infectious work up negative. AKI on CKD.      INTERVAL HISTORY Keith Howe is a 82 y.o. male who has above history reviewed by me today presents for follow up visit for prostate cancer. S/p 2 cycles of Docetaxel with GCSF supports.  Admitted due to cytopenia, near syncope.  He had "near syncope" and weakness, requiring assistance to prevent falls, significant decrease in hemoglobin levels necessitating blood transfusion; current hemoglobin at 9.2, not fully normalized. His insulin regimen changes to a sliding scale due to hypoglycemia episodes during hospitalization,, glucose level of "48." - lost weight, appetite  was low, improved,  now eating "really, really good."  Accompanied by wife today.  takes Orgovyx 120 mg daily- high copay now.    Review of Systems  Constitutional:  Negative for appetite change, chills, fatigue, fever and unexpected weight change.  HENT:   Negative for hearing loss and voice change.   Eyes:  Negative for eye problems and icterus.  Respiratory:  Negative for chest tightness, cough and shortness of breath.   Cardiovascular:  Negative for chest pain and leg swelling.  Gastrointestinal:  Negative for abdominal distention and abdominal pain.  Endocrine: Negative for hot flashes.  Genitourinary:  Negative for difficulty urinating, dysuria and frequency.   Musculoskeletal:  Negative for arthralgias.  Skin:  Negative for itching and rash.  Neurological:  Negative for light-headedness and numbness.  Hematological:  Negative for adenopathy. Does not bruise/bleed easily.  Psychiatric/Behavioral:  Negative for confusion.     MEDICAL HISTORY:  Past Medical History:  Diagnosis Date   BPH (benign prostatic hyperplasia)  Cancer    Chronic kidney disease    STAGE 3   Diabetes mellitus without complication    HOH (hard of hearing)    AIDS   Hypertension    Inguinal hernia    Pain    CHRONIC LBP   Prostate cancer     SURGICAL HISTORY: Past Surgical History:  Procedure Laterality Date   BACK SURGERY     Lumbar   CATARACT EXTRACTION W/PHACO Right 07/10/2017   Procedure: CATARACT EXTRACTION PHACO AND INTRAOCULAR LENS PLACEMENT (IOC);  Surgeon: Birder Robson, MD;  Location: [facility] ORS;  Service: Ophthalmology;  Laterality: Right;  [place] 00:29.2 AP% 16.5 CDE 4.83 Fluid Pack Lot # [id number]   COLONOSCOPY     EYE SURGERY Bilateral    cataract extraction   HERNIA REPAIR     INGUINAL   PROSTATE BIOPSY N/A 04/08/2020   Procedure: PROSTATE BIOPSY [name];  Surgeon: Royston Cowper, MD;  Location: [facility] ORS;  Service: Urology;  Laterality: N/A;   TOTAL KNEE ARTHROPLASTY Left  03/19/2018   Procedure: TOTAL KNEE ARTHROPLASTY;  Surgeon: Thornton Park, MD;  Location: [facility] ORS;  Service: Orthopedics;  Laterality: Left;    SOCIAL HISTORY: Social History   Socioeconomic History   Marital status: Married    Spouse name: Not on file   Number of children: Not on file   Years of education: Not on file   Highest education level: Some college, no degree  Occupational History   Not on file  Tobacco Use   Smoking status: Never   Smokeless tobacco: Never  Vaping Use   Vaping Use: Never used  Substance and Sexual Activity   Alcohol use: No   Drug use: Never   Sexual activity: Not Currently  Other Topics Concern   Not on file  Social History Narrative   Independent at baseline.  Lives at home with his wife   Social Determinants of Health   Financial Resource Strain: Low Risk  (05/12/2021)   Overall Financial Resource Strain (CARDIA)    Difficulty of Paying Living Expenses: Not hard at all  Food Insecurity: No Food Insecurity (07/31/2022)   Hunger Vital Sign    Worried About Running Out of Food in the Last Year: Never true    Ran Out of Food in the Last Year: Never true  Transportation Needs: No Transportation Needs (07/31/2022)   PRAPARE - Hydrologist (Medical): No    Lack of Transportation (Non-Medical): No  Physical Activity: Insufficiently Active (05/12/2021)   Exercise Vital Sign    Days of Exercise per Week: 7 days    Minutes of Exercise per Session: 10 min  Stress: No Stress Concern Present (05/12/2021)   [place]    Feeling of Stress : Not at all  Social Connections: [place] (05/12/2021)   Social Connection and Isolation Panel [NHANES]    Frequency of Communication with Friends and Family: More than three times a week    Frequency of Social Gatherings with Friends and Family: Once a week    Attends Religious Services: More than 4 times per year     Active Member of [employer] or Organizations: Yes    Attends Archivist Meetings: More than 4 times per year    Marital Status: Married  Human resources officer Violence: Not At Risk (07/31/2022)   Humiliation, Afraid, Rape, and Kick questionnaire    Fear of Current or Ex-Partner: No    Emotionally  Abused: No    Physically Abused: No    Sexually Abused: No    FAMILY HISTORY: Family History  Problem Relation Age of Onset   Diabetes Father    Breast cancer Sister     ALLERGIES:  has No Known Allergies.  MEDICATIONS:  Current Outpatient Medications  Medication Sig Dispense Refill   amLODipine (NORVASC) 5 MG tablet Take 2.5 mg by mouth daily.     aspirin EC 81 MG tablet Take 81 mg by mouth daily.     cholecalciferol (VITAMIN D) 1000 units tablet Take 1,000 Units by mouth daily.     dapagliflozin propanediol (FARXIGA) 10 MG TABS tablet Take by mouth daily.     dexamethasone (DECADRON) 4 MG tablet Take 2 tabs by mouth daily starting the day before chemo. Then take 2 tabs daily for 2 days starting day after chemo. Take with food. 36 tablet 1   loratadine (CLARITIN) 10 MG tablet Take 10 mg by mouth once daily before chemo and at least 2 days after injection.     losartan (COZAAR) 100 MG tablet Take 100 mg by mouth daily.     lovastatin (MEVACOR) 40 MG tablet TAKE 1 TABLET BY MOUTH EVERY DAY 90 tablet 3   metoprolol tartrate (LOPRESSOR) 50 MG tablet TAKE 1 TABLET BY MOUTH EVERY 12 HOURS 180 tablet 2   Misc Natural Products (PROSTATE SUPPORT PO) Take 2 capsules by mouth daily.     NOVOLOG FLEXPEN 100 UNIT/ML FlexPen Inject 7 Units into the skin 3 (three) times daily before meals.  0   Omega-3 Fatty Acids (FISH OIL PO) Take 1 capsule by mouth daily.      ondansetron (ZOFRAN) 8 MG tablet Take 1 tablet (8 mg total) by mouth every 8 (eight) hours as needed for nausea or vomiting. 30 tablet 1   prochlorperazine (COMPAZINE) 10 MG tablet Take 1 tablet (10 mg total) by mouth every 6 (six) hours as  needed for nausea or vomiting. 30 tablet 1   relugolix (ORGOVYX) 120 MG tablet Take 1 tablet (120 mg total) by mouth daily. 30 tablet 3   TRESIBA FLEXTOUCH 200 UNIT/ML SOPN Inject 80 Units into the muscle daily before breakfast.   2   vitamin B-12 (CYANOCOBALAMIN) 500 MCG tablet Take 500 mcg by mouth daily.     No current facility-administered medications for this visit.     PHYSICAL EXAMINATION: ECOG PERFORMANCE STATUS: 1 - Symptomatic but completely ambulatory Vitals:   08/09/22 0907  BP: (!) 141/87  Pulse: 83  Resp: 18  Temp: (!) 95.5 F (35.3 C)  SpO2: 100%   Filed Weights   08/09/22 0907  Weight: 229 lb 6.4 oz (104.1 kg)    Physical Exam Constitutional:      General: He is not in acute distress. HENT:     Head: Normocephalic and atraumatic.  Eyes:     General: No scleral icterus. Cardiovascular:     Rate and Rhythm: Normal rate and regular rhythm.     Heart sounds: Normal heart sounds.  Pulmonary:     Effort: Pulmonary effort is normal. No respiratory distress.     Breath sounds: No wheezing.  Abdominal:     General: Bowel sounds are normal. There is no distension.     Palpations: Abdomen is soft.  Musculoskeletal:        General: No deformity. Normal range of motion.     Cervical back: Normal range of motion and neck supple.  Skin:    General:  Skin is warm and dry.     Findings: No erythema or rash.  Neurological:     Mental Status: He is alert and oriented to person, place, and time. Mental status is at baseline.     Cranial Nerves: No cranial nerve deficit.     Coordination: Coordination normal.  Psychiatric:        Mood and Affect: Mood normal.     LABORATORY DATA:  I have reviewed the data as listed    Latest Ref Rng & Units 08/09/2022    8:48 AM 08/02/2022    4:04 AM 08/01/2022    6:41 AM  CBC  WBC 4.0 - 10.5 K/uL 8.4  17.4  27.6   Hemoglobin 13.0 - 17.0 g/dL 9.2  8.0  9.1   Hematocrit 39.0 - 52.0 % 27.8  23.2  26.1   Platelets 150 - 400 K/uL  331  97  122       Latest Ref Rng & Units 08/09/2022    8:48 AM 08/01/2022    6:41 AM 07/31/2022   12:09 PM  CMP  Glucose 70 - 99 mg/dL 131  48  221   BUN 8 - 23 mg/dL 20  75  90   Creatinine 0.61 - 1.24 mg/dL 1.31  2.12  2.51   Sodium 135 - 145 mmol/L 137  138  135   Potassium 3.5 - 5.1 mmol/L 3.4  3.4  4.1   Chloride 98 - 111 mmol/L 103  108  102   CO2 22 - 32 mmol/L 24  23  22    Calcium 8.9 - 10.3 mg/dL 8.8  8.1  8.6   Total Protein 6.5 - 8.1 g/dL 6.4   5.7   Total Bilirubin 0.3 - 1.2 mg/dL 0.3   0.5   Alkaline Phos 38 - 126 U/L 68   113   AST 15 - 41 U/L 20   18   ALT 0 - 44 U/L 17   15     RADIOGRAPHIC STUDIES: I have personally reviewed the radiological images as listed and agreed with the findings in the report. US Venous Img Lower Bilateral (DVT)  Result Date: 07/31/2022 CLINICAL DATA:  Acute renal injury with shortness of breath, initial encounter EXAM: BILATERAL LOWER EXTREMITY VENOUS DOPPLER ULTRASOUND TECHNIQUE: Gray-scale sonography with graded compression, as well as color Doppler and duplex ultrasound were performed to evaluate the lower extremity deep venous systems from the level of the common femoral vein and including the common femoral, femoral, profunda femoral, popliteal and calf veins including the posterior tibial, peroneal and gastrocnemius veins when visible. The superficial great saphenous vein was also interrogated. Spectral Doppler was utilized to evaluate flow at rest and with distal augmentation maneuvers in the common femoral, femoral and popliteal veins. COMPARISON:  None Available. FINDINGS: RIGHT LOWER EXTREMITY Common Femoral Vein: No evidence of thrombus. Normal compressibility, respiratory phasicity and response to augmentation. Saphenofemoral Junction: No evidence of thrombus. Normal compressibility and flow on color Doppler imaging. Profunda Femoral Vein: No evidence of thrombus. Normal compressibility and flow on color Doppler imaging. Femoral Vein: No  evidence of thrombus. Normal compressibility, respiratory phasicity and response to augmentation. Popliteal Vein: No evidence of thrombus. Normal compressibility, respiratory phasicity and response to augmentation. Calf Veins: No evidence of thrombus. Normal compressibility and flow on color Doppler imaging. Superficial Great Saphenous Vein: No evidence of thrombus. Normal compressibility. Venous Reflux:  None. Other Findings:  None. LEFT LOWER EXTREMITY Common Femoral Vein: No evidence of thrombus.  Normal compressibility, respiratory phasicity and response to augmentation. Saphenofemoral Junction: No evidence of thrombus. Normal compressibility and flow on color Doppler imaging. Profunda Femoral Vein: No evidence of thrombus. Normal compressibility and flow on color Doppler imaging. Femoral Vein: No evidence of thrombus. Normal compressibility, respiratory phasicity and response to augmentation. Popliteal Vein: No evidence of thrombus. Normal compressibility, respiratory phasicity and response to augmentation. Calf Veins: No evidence of thrombus. Normal compressibility and flow on color Doppler imaging. Superficial Great Saphenous Vein: No evidence of thrombus. Normal compressibility. Venous Reflux:  None. Other Findings:  None. IMPRESSION: No evidence of deep venous thrombosis in either lower extremity. Electronically Signed   By: Inez Catalina M.D.   On: 07/31/2022 19:29   US RENAL  Result Date: 07/31/2022 CLINICAL DATA:  [id number] AKI (acute kidney injury) ([place]) [id number] EXAM: RENAL / URINARY TRACT ULTRASOUND COMPLETE COMPARISON:  None Available. FINDINGS: Right Kidney: Renal measurements: 9.4 x 6.4 x 5.4 cm = volume: 169 mL. Echogenicity within normal limits. No mass. Mild to moderate hydronephrosis visualized. Left Kidney: Renal measurements: 9.4 x 6.1 x 5.5 cm = volume: 168 mL. Echogenicity within normal limits. No mass. Mild to moderate hydronephrosis visualized. Urinary bladder: Appears normal for degree of  bladder distention.  Unable to void. Other: Prostate measures 4.3 x 5.6 x 6.3 cm IMPRESSION: 1. Bilateral mild to moderate hydronephrosis. Prominent prostate gland. 2. Patient unable to void per ultrasound technician. Electronically Signed   By: Iven Finn M.D.   On: 07/31/2022 19:26   DG Chest Portable 1 View  Result Date: 07/31/2022 CLINICAL DATA:  Provided history: Sepsis. Recent syncopal episodes. History of prostate cancer. EXAM: PORTABLE CHEST 1 VIEW COMPARISON:  Head CT 06/14/2022. Prior chest radiographs 03/06/2018. FINDINGS: Heart size within normal limits. No appreciable airspace consolidation. No evidence of pleural effusion or pneumothorax. Known metastases within the thoracic spine, bilateral ribs and left scapula (demonstrated on the prior PET CT of 06/14/2022). IMPRESSION: No evidence of an acute cardiopulmonary abnormality. Electronically Signed   By: Kellie Simmering D.O.   On: 07/31/2022 13:56

## 2022-08-09 NOTE — Assessment & Plan Note (Signed)
Refer to dietitian

## 2022-08-10 ENCOUNTER — Ambulatory Visit: Payer: Medicare HMO | Admitting: Oncology

## 2022-08-10 ENCOUNTER — Other Ambulatory Visit: Payer: Self-pay

## 2022-08-11 ENCOUNTER — Inpatient Hospital Stay: Payer: Medicare HMO

## 2022-08-15 ENCOUNTER — Inpatient Hospital Stay: Payer: Medicare HMO

## 2022-08-15 DIAGNOSIS — Z5111 Encounter for antineoplastic chemotherapy: Secondary | ICD-10-CM | POA: Diagnosis not present

## 2022-08-15 DIAGNOSIS — C61 Malignant neoplasm of prostate: Secondary | ICD-10-CM

## 2022-08-15 MED ORDER — LEUPROLIDE ACETATE (6 MONTH) 45 MG ~~LOC~~ KIT
45.0000 mg | PACK | Freq: Once | SUBCUTANEOUS | Status: AC
  Administered 2022-08-15: 45 mg via SUBCUTANEOUS
  Filled 2022-08-15: qty 45

## 2022-08-15 NOTE — Progress Notes (Signed)
Nutrition Assessment   Reason for Assessment:   Referral for weight loss   ASSESSMENT:  82 year old male with prostate cancer.  Past medical history of CKD stage 3, DM, HTN, HOH.  Patient receiving docetaxel q 3 weeks.  Met with patient and wife in clinic.  Patient reports that appetite is good now.  Appetite has been decreased and taste of foods "off" during the first week or so after infusion.  Appetite and taste improves prior to next treatment.  Denies nausea, vomiting, constipation, diarrhea, mouth sores. Patient has been drinking glucerna shake daily.  Breakfast is usually grits, eggs, sausage, toast, juice, coffee.  Lunch is banana sandwich and fruit or meat sandwich.  Dinner is meat and couple sides.  Has been drinking body armour, propel, water.    Medications: tresiba, decadron, novolog, omega 3, zofran, vit D   Labs: K 3.4, glucose 131, creatinine 1.31, cal 8.8   Anthropometrics:   Height: 73 inches Weight: 229 lb 6.4 oz on 4/3 UBW: 240s (07/05/22) BMI: 30  4% weight loss in the last 1 1/2 months   Estimated Energy Needs  Kcals: 2600-3100 Protein: 130-155 g Fluid: 2600-3100 ml   NUTRITION DIAGNOSIS: Inadequate oral intake related to cancer and related side effects as evidenced by 4% weight loss in the last 1 1/2 months and taste change, decreased intake    INTERVENTION:  Discussed strategies for taste change.  Handout provided Encouraged protein food at every meal. Discussed examples of protein foods Continue glucerna shake. If weight continues to decrease, increase to BID. Coupons given Contact information provided   MONITORING, EVALUATION, GOAL: weight trends, intake   Next Visit: Wednesday, May 8 during infusion  Mehreen Azizi B. Freida Busman, RD, LDN Registered Dietitian [phone]

## 2022-08-15 NOTE — Patient Instructions (Signed)
Providing this information per your request  Primary Care Provider's in this area Call [phone]- [facility])  [facility] Main: [phone] [facility] on [address])  [facility] -  [phone] [facility] on [address])  [facility] ([address] in [place]) [phone]  [facility] [place]) [phone]   [facility] - [phone]  [facility] - Internal Medicine [phone]

## 2022-08-22 MED FILL — Dexamethasone Sodium Phosphate Inj 100 MG/10ML: INTRAMUSCULAR | Qty: 1 | Status: AC

## 2022-08-23 ENCOUNTER — Other Ambulatory Visit: Payer: Self-pay

## 2022-08-23 ENCOUNTER — Encounter: Payer: Self-pay | Admitting: Oncology

## 2022-08-23 ENCOUNTER — Inpatient Hospital Stay (HOSPITAL_BASED_OUTPATIENT_CLINIC_OR_DEPARTMENT_OTHER): Payer: Medicare HMO | Admitting: Oncology

## 2022-08-23 ENCOUNTER — Inpatient Hospital Stay: Payer: Medicare HMO

## 2022-08-23 ENCOUNTER — Other Ambulatory Visit: Payer: Self-pay | Admitting: Oncology

## 2022-08-23 VITALS — BP 131/72 | HR 94 | Temp 96.9°F | Resp 18 | Wt 240.3 lb

## 2022-08-23 DIAGNOSIS — D701 Agranulocytosis secondary to cancer chemotherapy: Secondary | ICD-10-CM

## 2022-08-23 DIAGNOSIS — C61 Malignant neoplasm of prostate: Secondary | ICD-10-CM

## 2022-08-23 DIAGNOSIS — D649 Anemia, unspecified: Secondary | ICD-10-CM

## 2022-08-23 DIAGNOSIS — Z5111 Encounter for antineoplastic chemotherapy: Secondary | ICD-10-CM | POA: Diagnosis not present

## 2022-08-23 DIAGNOSIS — T451X5A Adverse effect of antineoplastic and immunosuppressive drugs, initial encounter: Secondary | ICD-10-CM

## 2022-08-23 DIAGNOSIS — N1832 Chronic kidney disease, stage 3b: Secondary | ICD-10-CM | POA: Diagnosis not present

## 2022-08-23 LAB — CBC WITH DIFFERENTIAL/PLATELET
Abs Immature Granulocytes: 0.03 10*3/uL (ref 0.00–0.07)
Basophils Absolute: 0 10*3/uL (ref 0.0–0.1)
Basophils Relative: 0 %
Eosinophils Absolute: 0.1 10*3/uL (ref 0.0–0.5)
Eosinophils Relative: 1 %
HCT: 30.4 % — ABNORMAL LOW (ref 39.0–52.0)
Hemoglobin: 9.8 g/dL — ABNORMAL LOW (ref 13.0–17.0)
Immature Granulocytes: 1 %
Lymphocytes Relative: 11 %
Lymphs Abs: 0.7 10*3/uL (ref 0.7–4.0)
MCH: 28.9 pg (ref 26.0–34.0)
MCHC: 32.2 g/dL (ref 30.0–36.0)
MCV: 89.7 fL (ref 80.0–100.0)
Monocytes Absolute: 0.7 10*3/uL (ref 0.1–1.0)
Monocytes Relative: 11 %
Neutro Abs: 5 10*3/uL (ref 1.7–7.7)
Neutrophils Relative %: 76 %
Platelets: 251 10*3/uL (ref 150–400)
RBC: 3.39 MIL/uL — ABNORMAL LOW (ref 4.22–5.81)
RDW: 15.2 % (ref 11.5–15.5)
WBC: 6.6 10*3/uL (ref 4.0–10.5)
nRBC: 0 % (ref 0.0–0.2)

## 2022-08-23 LAB — COMPREHENSIVE METABOLIC PANEL
ALT: 17 U/L (ref 0–44)
AST: 22 U/L (ref 15–41)
Albumin: 3.7 g/dL (ref 3.5–5.0)
Alkaline Phosphatase: 59 U/L (ref 38–126)
Anion gap: 8 (ref 5–15)
BUN: 31 mg/dL — ABNORMAL HIGH (ref 8–23)
CO2: 25 mmol/L (ref 22–32)
Calcium: 9.2 mg/dL (ref 8.9–10.3)
Chloride: 105 mmol/L (ref 98–111)
Creatinine, Ser: 1.41 mg/dL — ABNORMAL HIGH (ref 0.61–1.24)
GFR, Estimated: 50 mL/min — ABNORMAL LOW (ref >60)
Glucose, Bld: 149 mg/dL — ABNORMAL HIGH (ref 70–99)
Potassium: 4 mmol/L (ref 3.5–5.1)
Sodium: 138 mmol/L (ref 135–145)
Total Bilirubin: 0.4 mg/dL (ref 0.3–1.2)
Total Protein: 6.8 g/dL (ref 6.5–8.1)

## 2022-08-23 LAB — IRON AND TIBC
Iron: 68 ug/dL (ref 45–182)
Saturation Ratios: 21 % (ref 17.9–39.5)
TIBC: 318 ug/dL (ref 250–450)
UIBC: 250 ug/dL

## 2022-08-23 LAB — RETIC PANEL
Immature Retic Fract: 18.8 % — ABNORMAL HIGH (ref 2.3–15.9)
RBC.: 3.42 MIL/uL — ABNORMAL LOW (ref 4.22–5.81)
Retic Count, Absolute: 92.3 10*3/uL (ref 19.0–186.0)
Retic Ct Pct: 2.7 % (ref 0.4–3.1)
Reticulocyte Hemoglobin: 28.1 pg (ref >27.9)

## 2022-08-23 LAB — FERRITIN: Ferritin: 107 ng/mL (ref 24–336)

## 2022-08-23 LAB — PSA: Prostatic Specific Antigen: 21.69 ng/mL — ABNORMAL HIGH (ref 0.00–4.00)

## 2022-08-23 MED ORDER — SODIUM CHLORIDE 0.9 % IV SOLN
10.0000 mg | Freq: Once | INTRAVENOUS | Status: AC
  Administered 2022-08-23: 10 mg via INTRAVENOUS
  Filled 2022-08-23: qty 1
  Filled 2022-08-23: qty 10

## 2022-08-23 MED ORDER — IRON-VITAMIN C 65-125 MG PO TABS: 1.0000 | ORAL_TABLET | Freq: Every day | ORAL | 3 refills | Status: DC

## 2022-08-23 MED ORDER — SODIUM CHLORIDE 0.9 % IV SOLN
Freq: Once | INTRAVENOUS | Status: AC
  Filled 2022-08-23: qty 250

## 2022-08-23 MED ORDER — SODIUM CHLORIDE 0.9 % IV SOLN
75.0000 mg/m2 | Freq: Once | INTRAVENOUS | Status: AC
  Administered 2022-08-23: 180 mg via INTRAVENOUS
  Filled 2022-08-23: qty 18

## 2022-08-23 NOTE — Patient Instructions (Signed)
[facility] [place] [place] [facility]  Discharge Instructions: Thank you for choosing [facility] to provide your oncology and hematology care.  If you have a lab appointment with the Cancer Center, please go directly to the Cancer Center and check in at the registration area.  Wear comfortable clothing and clothing appropriate for easy access to any Portacath or PICC line.   We strive to give you quality time with your provider. You may need to reschedule your appointment if you arrive late (15 or more minutes).  Arriving late affects you and other patients whose appointments are after yours.  Also, if you miss three or more appointments without notifying the office, you may be dismissed from the clinic at the provider's discretion.      For prescription refill requests, have your pharmacy contact our office and allow 72 hours for refills to be completed.    Today you received the following chemotherapy and/or immunotherapy agents- Taxotere      To help prevent nausea and vomiting after your treatment, we encourage you to take your nausea medication as directed.  BELOW ARE SYMPTOMS THAT SHOULD BE REPORTED IMMEDIATELY: *FEVER GREATER THAN 100.4 F (38 C) OR HIGHER *CHILLS OR SWEATING *NAUSEA AND VOMITING THAT IS NOT CONTROLLED WITH YOUR NAUSEA MEDICATION *UNUSUAL SHORTNESS OF BREATH *UNUSUAL BRUISING OR BLEEDING *URINARY PROBLEMS (pain or burning when urinating, or frequent urination) *BOWEL PROBLEMS (unusual diarrhea, constipation, pain near the anus) TENDERNESS IN MOUTH AND THROAT WITH OR WITHOUT PRESENCE OF ULCERS (sore throat, sores in mouth, or a toothache) UNUSUAL RASH, SWELLING OR PAIN  UNUSUAL VAGINAL DISCHARGE OR ITCHING   Items with * indicate a potential emergency and should be followed up as soon as possible or go to the Emergency Department if any problems should occur.  Please show the CHEMOTHERAPY ALERT CARD or IMMUNOTHERAPY ALERT CARD at check-in to  the Emergency Department and triage nurse.  Should you have questions after your visit or need to cancel or reschedule your appointment, please contact [facility] [place] [facility]  [phone] and follow the prompts.  Office hours are 8:00 a.m. to 4:30 p.m. Monday - Friday. Please note that voicemails left after 4:00 p.m. may not be returned until the following business day.  We are closed weekends and major holidays. You have access to a nurse at all times for urgent questions. Please call the main number to the clinic [phone] and follow the prompts.  For any non-urgent questions, you may also contact your provider using MyChart. We now offer e-Visits for anyone 18 and older to request care online for non-urgent symptoms. For details visit [email or web address].   Also download the MyChart app! Go to the app store, search "MyChart", open the app, select [facility], and log in with your MyChart username and password.    

## 2022-08-23 NOTE — Assessment & Plan Note (Addendum)
Due to chemotherapy as well anemia of CKD Check iron tibc ferritin  Weekly lab monitoring.

## 2022-08-23 NOTE — Progress Notes (Signed)
Hematology/Oncology Progress note [phone] [phone]      CHIEF COMPLAINTS/REASON FOR VISIT:  Metastatic prostate cancer  ASSESSMENT & PLAN:   Cancer Staging  Prostate cancer Staging form: Prostate, AJCC 8th Edition - Clinical: Stage Unknown (rcTX, cNX) - Signed by Rickard Patience, MD on 07/13/2021 - Pathologic stage from 06/16/2022: Stage IVB (pNX, cM1b, Grade Group: 5) - Signed by Rickard Patience, MD on 06/16/2022   Prostate cancer [facility]) Prostate cancer, Gleason score 10, s/p RT[2022]- biochemical recurrence 06/30/2021 on Docetaxel Q3weeks x 6 cycles with D3 GCSF Labs are reviewed and discussed with patient and wife.  Proceed with  Docetaxel 75mg /m2 with GCSF support.   08/15/2022 Eligard 45mg  Q6 months.- next due in Oct 2024  CKD (chronic kidney disease) stage 3, GFR 30-59 ml/min Creatinine is close to baseline.  Encourage oral hydration and avoid nephrotoxins.    Encounter for antineoplastic chemotherapy Treatment as listed above.   Chemotherapy induced neutropenia (HCC) Patient gets G-CSF on D3  Normocytic anemia Due to chemotherapy as well anemia of CKD Check iron tibc ferritin  Weekly lab monitoring.     Orders Placed This Encounter  Procedures   Iron and TIBC([facility])   Retic Panel    Standing Status:   Future    Number of Occurrences:   1    Standing Expiration Date:   08/23/2023   CBC with Differential (Cancer Center Only)    Standing Status:   Future    Standing Expiration Date:   08/23/2023   Hold Tube- Blood Bank    Standing Status:   Future    Standing Expiration Date:   08/23/2023    Follow up  3 weeks lab MD Docetaxel D3 GCSF  All questions were answered. The patient knows to call the clinic with any problems, questions or concerns.  Rickard Patience, MD, PhD [facility] Hematology Oncology 08/23/2022   HISTORY OF PRESENTING ILLNESS:   Keith Howe is a  82 y.o.  male presents for follow up for prostate cancer Oncology History   Prostate cancer  02/24/2020 Tumor Marker   PSA at Dr..Wolff's office 5.7.Marland Kitchen   03/17/2020 Imaging    MRI of the prostate showed PI-RADS category 5 lesion of the left posterolateral and posteromedial peripheral zone in the base, mid gland, and apex, with involvement of the left central zone at the base. This lesion bulges the prosthetic capsular margin adjacent to the rectum, and there is a high suspicion for extraprostatic spread and early seminal vesicle invasion and likely left neurovascular bundle involvement. PI-RADS category 2 lesion in the right peripheral zone,considered PI-RADS category 2 given the nonfocal nature.. Encapsulated nodularity in the transition zone compatible with benign prostatic hypertrophy. Prostate volume 71.50 cubic cm.    04/08/2020 Initial Diagnosis   Prostate cancer (HCC) prostate cancer history dated back to 04/08/20.  Prostate biopsy showed prostate adenocarcinoma, 12 out of 12 cores involved.  Gleason score 10 [5+5] 2 cores, Gleason 9 [5+4] 1 core, Gleason 9 [4+5] involving 8 cores.     05/13/2020 Imaging   CT abdomen pelvis without contrast showed moderately enlarged prostate, no evidence of abdominal or pelvic metastatic disease.    05/13/2020 Imaging   bone scan whole body showed no evidence of osseous metastasis    06/04/2020 -  Radiation Therapy   patient was seen by Dr. Rushie Chestnut and underwent IMRT to prostate and pelvic nodes.  Dr. Rushie Chestnut asked Dr. Evelene Croon to give adjuvant deprivation therapy.    12/29/2020 Tumor Marker  PSA 0.07   06/22/2021 -  Chemotherapy   Orgovyx  daily Xtandi  daily   07/01/2021 Tumor Marker   PSA 4.3 Patient was referred by Dr. Rushie Chestnut to reestablish care with oncology for evaluation.  He was previously seen by me in the hematology clinic for abnormal SPEP and lost follow-up.  Oncology was not involved previously for treatment of prostate cancer.    07/13/2021 Cancer Staging   Staging form: Prostate, AJCC 8th Edition -  Clinical: Stage Unknown (rcTX, cNX) - Signed by Rickard Patience, MD on 07/13/2021 Stage prefix: Recurrence   10/04/2021 Tumor Marker   PSA at Urologist office  <0.1   01/02/2022 Tumor Marker   PSA at urologist office 0.5   05/26/2022 Tumor Marker   PSA 9.59  Previous prostate cancer treatment was with Dr. Evelene Croon.  05/26/2022  Patient presented to reestablish care as Dr. Evelene Croon retired   06/15/2022 Imaging   PMSA showed Several radiotracer avid bone metastases in the left scapula, thoracic spine, and bilateral ribs. No evidence of soft tissue metastatic disease. Stable enlarged prostate, with findings of chronic bladder outlet obstruction and vesicoureteral reflux. Aortic Atherosclerosis     06/16/2022 Cancer Staging   Staging form: Prostate, AJCC 8th Edition - Pathologic stage from 06/16/2022: Stage IVB (pNX, cM1b, Grade Group: 5) - Signed by Rickard Patience, MD on 06/16/2022 Histologic grading system: 5 grade system   06/16/2022 Tumor Marker   PSA  21.45   06/28/2022 -  Chemotherapy   Patient is on Treatment Plan : PROSTATE Docetaxel (75)     07/31/2022 - 08/02/2022 Hospital Admission   Admitted due to near syncope, Hb 6.9, s/p PRBC transfusion x 2 units.  No bleeding history. SIRS, infectious work up negative. AKI on CKD.      INTERVAL HISTORY Keith Howe is a 82 y.o. male who has above history reviewed by me today presents for follow up visit for prostate cancer. S/p 2 cycles of Docetaxel with GCSF supports.  Accompanied by wife today. He has gained weight. Appetite is fair.  Off Orgovyx, s/p Eligard  x 1 on 08/15/22  No new complaints.   Review of Systems  Constitutional:  Negative for appetite change, chills, fatigue, fever and unexpected weight change.  HENT:   Negative for hearing loss and voice change.   Eyes:  Negative for eye problems and icterus.  Respiratory:  Negative for chest tightness, cough and shortness of breath.   Cardiovascular:  Negative for chest pain and leg swelling.   Gastrointestinal:  Negative for abdominal distention and abdominal pain.  Endocrine: Negative for hot flashes.  Genitourinary:  Negative for difficulty urinating, dysuria and frequency.   Musculoskeletal:  Negative for arthralgias.  Skin:  Negative for itching and rash.  Neurological:  Negative for light-headedness and numbness.  Hematological:  Negative for adenopathy. Does not bruise/bleed easily.  Psychiatric/Behavioral:  Negative for confusion.     MEDICAL HISTORY:  Past Medical History:  Diagnosis Date   BPH (benign prostatic hyperplasia)    Cancer    Chronic kidney disease    STAGE 3   Diabetes mellitus without complication    HOH (hard of hearing)    AIDS   Hypertension    Inguinal hernia    Pain    CHRONIC LBP   Prostate cancer     SURGICAL HISTORY: Past Surgical History:  Procedure Laterality Date   BACK SURGERY     Lumbar   CATARACT EXTRACTION W/PHACO Right 07/10/2017   Procedure: CATARACT  EXTRACTION PHACO AND INTRAOCULAR LENS PLACEMENT (IOC);  Surgeon: Galen Manila, MD;  Location: [facility] ORS;  Service: Ophthalmology;  Laterality: Right;  [place] 00:29.2 AP% 16.5 CDE 4.83 Fluid Pack Lot # [id number]   COLONOSCOPY     EYE SURGERY Bilateral    cataract extraction   HERNIA REPAIR     INGUINAL   PROSTATE BIOPSY N/A 04/08/2020   Procedure: PROSTATE BIOPSY Addison Bailey;  Surgeon: Orson Ape, MD;  Location: [facility] ORS;  Service: Urology;  Laterality: N/A;   TOTAL KNEE ARTHROPLASTY Left 03/19/2018   Procedure: TOTAL KNEE ARTHROPLASTY;  Surgeon: Juanell Fairly, MD;  Location: [facility] ORS;  Service: Orthopedics;  Laterality: Left;    SOCIAL HISTORY: Social History   Socioeconomic History   Marital status: Married    Spouse name: Not on file   Number of children: Not on file   Years of education: Not on file   Highest education level: Some college, no degree  Occupational History   Not on file  Tobacco Use   Smoking status: Never   Smokeless tobacco: Never  Vaping  Use   Vaping Use: Never used  Substance and Sexual Activity   Alcohol use: No   Drug use: Never   Sexual activity: Not Currently  Other Topics Concern   Not on file  Social History Narrative   Independent at baseline.  Lives at home with his wife   Social Determinants of Health   Financial Resource Strain: Low Risk  (05/12/2021)   Overall Financial Resource Strain (CARDIA)    Difficulty of Paying Living Expenses: Not hard at all  Food Insecurity: No Food Insecurity (07/31/2022)   Hunger Vital Sign    Worried About Running Out of Food in the Last Year: Never true    Ran Out of Food in the Last Year: Never true  Transportation Needs: No Transportation Needs (07/31/2022)   PRAPARE - Administrator, Civil Service (Medical): No    Lack of Transportation (Non-Medical): No  Physical Activity: Insufficiently Active (05/12/2021)   Exercise Vital Sign    Days of Exercise per Week: 7 days    Minutes of Exercise per Session: 10 min  Stress: No Stress Concern Present (05/12/2021)   [employer] of Occupational Health - Occupational Stress Questionnaire    Feeling of Stress : Not at all  Social Connections: Socially Integrated (05/12/2021)   Social Connection and Isolation Panel [NHANES]    Frequency of Communication with Friends and Family: More than three times a week    Frequency of Social Gatherings with Friends and Family: Once a week    Attends Religious Services: More than 4 times per year    Active Member of [employer] or Organizations: Yes    Attends Engineer, structural: More than 4 times per year    Marital Status: Married  Catering manager Violence: Not At Risk (07/31/2022)   Humiliation, Afraid, Rape, and Kick questionnaire    Fear of Current or Ex-Partner: No    Emotionally Abused: No    Physically Abused: No    Sexually Abused: No    FAMILY HISTORY: Family History  Problem Relation Age of Onset   Diabetes Father    Breast cancer Sister     ALLERGIES:   has No Known Allergies.  MEDICATIONS:  Current Outpatient Medications  Medication Sig Dispense Refill   amLODipine (NORVASC) 5 MG tablet Take 2.5 mg by mouth daily.     aspirin EC 81 MG tablet Take 81 mg  by mouth daily.     cholecalciferol (VITAMIN D) 1000 units tablet Take 1,000 Units by mouth daily.     dapagliflozin propanediol (FARXIGA) 10 MG TABS tablet Take by mouth daily.     dexamethasone (DECADRON) 4 MG tablet Take 2 tabs by mouth daily starting the day before chemo. Then take 2 tabs daily for 2 days starting day after chemo. Take with food. 36 tablet 1   loratadine (CLARITIN) 10 MG tablet Take 10 mg by mouth once daily before chemo and at least 2 days after injection.     losartan (COZAAR) 100 MG tablet Take 100 mg by mouth daily.     lovastatin (MEVACOR) 40 MG tablet TAKE 1 TABLET BY MOUTH EVERY DAY 90 tablet 3   metoprolol tartrate (LOPRESSOR) 50 MG tablet TAKE 1 TABLET BY MOUTH EVERY 12 HOURS 180 tablet 2   Misc Natural Products (PROSTATE SUPPORT PO) Take 2 capsules by mouth daily.     NOVOLOG FLEXPEN 100 UNIT/ML FlexPen Inject into the skin. Per sliding scale  0   Omega-3 Fatty Acids (FISH OIL PO) Take 1 capsule by mouth daily.      ondansetron (ZOFRAN) 8 MG tablet Take 1 tablet (8 mg total) by mouth every 8 (eight) hours as needed for nausea or vomiting. 30 tablet 1   prochlorperazine (COMPAZINE) 10 MG tablet Take 1 tablet (10 mg total) by mouth every 6 (six) hours as needed for nausea or vomiting. 30 tablet 1   TRESIBA FLEXTOUCH 200 UNIT/ML SOPN Inject 80 Units into the muscle daily before breakfast.   2   TRULICITY 1.5 MG/0.5ML SOPN Inject into the skin.     vitamin B-12 (CYANOCOBALAMIN) 500 MCG tablet Take 500 mcg by mouth daily.     No current facility-administered medications for this visit.     PHYSICAL EXAMINATION: ECOG PERFORMANCE STATUS: 1 - Symptomatic but completely ambulatory Vitals:   08/23/22 0925  BP: 131/72  Pulse: 94  Resp: 18  Temp: (!) 96.9 F (36.1  C)   Filed Weights   08/23/22 0925  Weight: 240 lb 4.8 oz (109 kg)    Physical Exam Constitutional:      General: He is not in acute distress. HENT:     Head: Normocephalic and atraumatic.  Eyes:     General: No scleral icterus. Cardiovascular:     Rate and Rhythm: Normal rate and regular rhythm.     Heart sounds: Normal heart sounds.  Pulmonary:     Effort: Pulmonary effort is normal. No respiratory distress.     Breath sounds: No wheezing.  Abdominal:     General: Bowel sounds are normal. There is no distension.     Palpations: Abdomen is soft.  Musculoskeletal:        General: No deformity. Normal range of motion.     Cervical back: Normal range of motion and neck supple.  Skin:    General: Skin is warm and dry.     Findings: No erythema or rash.  Neurological:     Mental Status: He is alert and oriented to person, place, and time. Mental status is at baseline.     Cranial Nerves: No cranial nerve deficit.     Coordination: Coordination normal.  Psychiatric:        Mood and Affect: Mood normal.     LABORATORY DATA:  I have reviewed the data as listed    Latest Ref Rng & Units 08/23/2022    8:51 AM 08/09/2022  8:48 AM 08/02/2022    4:04 AM  CBC  WBC 4.0 - 10.5 K/uL 6.6  8.4  17.4   Hemoglobin 13.0 - 17.0 g/dL 9.8  9.2  8.0   Hematocrit 39.0 - 52.0 % 30.4  27.8  23.2   Platelets 150 - 400 K/uL 251  331  97       Latest Ref Rng & Units 08/23/2022    8:51 AM 08/09/2022    8:48 AM 08/01/2022    6:41 AM  CMP  Glucose 70 - 99 mg/dL 161  096  48   BUN 8 - 23 mg/dL 31  20  75   Creatinine 0.61 - 1.24 mg/dL 0.45  4.09  8.11   Sodium 135 - 145 mmol/L 138  137  138   Potassium 3.5 - 5.1 mmol/L 4.0  3.4  3.4   Chloride 98 - 111 mmol/L 105  103  108   CO2 22 - 32 mmol/L Calcium 8.9 - 10.3 mg/dL 9.2  8.8  8.1   Total Protein 6.5 - 8.1 g/dL 6.8  6.4    Total Bilirubin 0.3 - 1.2 mg/dL 0.4  0.3    Alkaline Phos 38 - 126 U/L 59  68    AST 15 - 41 U/L 22  20     ALT 0 - 44 U/L 17  17      RADIOGRAPHIC STUDIES: I have personally reviewed the radiological images as listed and agreed with the findings in the report. US Venous Img Lower Bilateral (DVT)  Result Date: 07/31/2022 CLINICAL DATA:  Acute renal injury with shortness of breath, initial encounter EXAM: BILATERAL LOWER EXTREMITY VENOUS DOPPLER ULTRASOUND TECHNIQUE: Gray-scale sonography with graded compression, as well as color Doppler and duplex ultrasound were performed to evaluate the lower extremity deep venous systems from the level of the common femoral vein and including the common femoral, femoral, profunda femoral, popliteal and calf veins including the posterior tibial, peroneal and gastrocnemius veins when visible. The superficial great saphenous vein was also interrogated. Spectral Doppler was utilized to evaluate flow at rest and with distal augmentation maneuvers in the common femoral, femoral and popliteal veins. COMPARISON:  None Available. FINDINGS: RIGHT LOWER EXTREMITY Common Femoral Vein: No evidence of thrombus. Normal compressibility, respiratory phasicity and response to augmentation. Saphenofemoral Junction: No evidence of thrombus. Normal compressibility and flow on color Doppler imaging. Profunda Femoral Vein: No evidence of thrombus. Normal compressibility and flow on color Doppler imaging. Femoral Vein: No evidence of thrombus. Normal compressibility, respiratory phasicity and response to augmentation. Popliteal Vein: No evidence of thrombus. Normal compressibility, respiratory phasicity and response to augmentation. Calf Veins: No evidence of thrombus. Normal compressibility and flow on color Doppler imaging. Superficial Great Saphenous Vein: No evidence of thrombus. Normal compressibility. Venous Reflux:  None. Other Findings:  None. LEFT LOWER EXTREMITY Common Femoral Vein: No evidence of thrombus. Normal compressibility, respiratory phasicity and response to augmentation.  Saphenofemoral Junction: No evidence of thrombus. Normal compressibility and flow on color Doppler imaging. Profunda Femoral Vein: No evidence of thrombus. Normal compressibility and flow on color Doppler imaging. Femoral Vein: No evidence of thrombus. Normal compressibility, respiratory phasicity and response to augmentation. Popliteal Vein: No evidence of thrombus. Normal compressibility, respiratory phasicity and response to augmentation. Calf Veins: No evidence of thrombus. Normal compressibility and flow on color Doppler imaging. Superficial Great Saphenous Vein: No evidence of thrombus. Normal compressibility. Venous Reflux:  None. Other Findings:  None. IMPRESSION: No  evidence of deep venous thrombosis in either lower extremity. Electronically Signed   By: Alcide Clever M.D.   On: 07/31/2022 19:29   US RENAL  Result Date: 07/31/2022 CLINICAL DATA:  [id number] AKI (acute kidney injury) (HCC) [id number] EXAM: RENAL / URINARY TRACT ULTRASOUND COMPLETE COMPARISON:  None Available. FINDINGS: Right Kidney: Renal measurements: 9.4 x 6.4 x 5.4 cm = volume: 169 mL. Echogenicity within normal limits. No mass. Mild to moderate hydronephrosis visualized. Left Kidney: Renal measurements: 9.4 x 6.1 x 5.5 cm = volume: 168 mL. Echogenicity within normal limits. No mass. Mild to moderate hydronephrosis visualized. Urinary bladder: Appears normal for degree of bladder distention.  Unable to void. Other: Prostate measures 4.3 x 5.6 x 6.3 cm IMPRESSION: 1. Bilateral mild to moderate hydronephrosis. Prominent prostate gland. 2. Patient unable to void per ultrasound technician. Electronically Signed   By: Tish Frederickson M.D.   On: 07/31/2022 19:26   DG Chest Portable 1 View  Result Date: 07/31/2022 CLINICAL DATA:  Provided history: Sepsis. Recent syncopal episodes. History of prostate cancer. EXAM: PORTABLE CHEST 1 VIEW COMPARISON:  Head CT 06/14/2022. Prior chest radiographs 03/06/2018. FINDINGS: Heart size within normal limits.  No appreciable airspace consolidation. No evidence of pleural effusion or pneumothorax. Known metastases within the thoracic spine, bilateral ribs and left scapula (demonstrated on the prior PET CT of 06/14/2022). IMPRESSION: No evidence of an acute cardiopulmonary abnormality. Electronically Signed   By: Jackey Loge D.O.   On: 07/31/2022 13:56

## 2022-08-23 NOTE — Progress Notes (Signed)
Pt here for follow up. No new concerns voiced.   

## 2022-08-23 NOTE — Assessment & Plan Note (Signed)
Creatinine is close to baseline.  Encourage oral hydration and avoid nephrotoxins.

## 2022-08-23 NOTE — Assessment & Plan Note (Addendum)
Prostate cancer, Gleason score 10, s/p RT[2022]- biochemical recurrence 06/30/2021 on Docetaxel Q3weeks x 6 cycles with D3 GCSF Labs are reviewed and discussed with patient and wife.  Proceed with  Docetaxel 75mg /m2 with GCSF support.   08/15/2022 Eligard 45mg  Q6 months.- next due in Oct 2024

## 2022-08-23 NOTE — Assessment & Plan Note (Signed)
Patient gets G-CSF on D3 

## 2022-08-23 NOTE — Assessment & Plan Note (Signed)
Treatment as listed above 

## 2022-08-24 ENCOUNTER — Telehealth: Payer: Self-pay

## 2022-08-24 NOTE — Telephone Encounter (Signed)
Called and spoke to patient's wife and informed her that Dr. Cathie Hoops is recommending Keith Howe start on oral iron supplementation and Rx of Vitron C was sent to pharmacy. Wife stated that they would pick up medication and start taking.

## 2022-08-24 NOTE — Telephone Encounter (Signed)
-----   Message from Rickard Patience, MD sent at 08/23/2022  9:18 PM EDT ----- Please recommend him to start oral iron supplementation.  Vitron C 1 tab daily. Rx sent. Thanks.

## 2022-08-25 ENCOUNTER — Inpatient Hospital Stay: Payer: Medicare HMO

## 2022-08-25 DIAGNOSIS — Z5111 Encounter for antineoplastic chemotherapy: Secondary | ICD-10-CM | POA: Diagnosis not present

## 2022-08-25 DIAGNOSIS — C61 Malignant neoplasm of prostate: Secondary | ICD-10-CM

## 2022-08-25 MED ORDER — PEGFILGRASTIM-JMDB 6 MG/0.6ML ~~LOC~~ SOSY
6.0000 mg | PREFILLED_SYRINGE | Freq: Once | SUBCUTANEOUS | Status: AC
  Administered 2022-08-25: 6 mg via SUBCUTANEOUS
  Filled 2022-08-25: qty 0.6

## 2022-08-29 ENCOUNTER — Other Ambulatory Visit: Payer: Self-pay

## 2022-08-29 ENCOUNTER — Encounter: Payer: Self-pay | Admitting: Oncology

## 2022-08-30 ENCOUNTER — Ambulatory Visit: Payer: Medicare HMO | Admitting: Oncology

## 2022-08-30 ENCOUNTER — Ambulatory Visit: Payer: Medicare HMO

## 2022-08-30 ENCOUNTER — Other Ambulatory Visit: Payer: Medicare HMO

## 2022-08-31 ENCOUNTER — Inpatient Hospital Stay: Payer: Medicare HMO

## 2022-08-31 ENCOUNTER — Ambulatory Visit: Payer: Medicare HMO | Admitting: Oncology

## 2022-08-31 DIAGNOSIS — C61 Malignant neoplasm of prostate: Secondary | ICD-10-CM

## 2022-08-31 DIAGNOSIS — Z5111 Encounter for antineoplastic chemotherapy: Secondary | ICD-10-CM | POA: Diagnosis not present

## 2022-08-31 LAB — CBC WITH DIFFERENTIAL (CANCER CENTER ONLY)
Abs Immature Granulocytes: 3.84 10*3/uL — ABNORMAL HIGH (ref 0.00–0.07)
Basophils Absolute: 0 10*3/uL (ref 0.0–0.1)
Basophils Relative: 0 %
Eosinophils Absolute: 0.1 10*3/uL (ref 0.0–0.5)
Eosinophils Relative: 0 %
HCT: 30.3 % — ABNORMAL LOW (ref 39.0–52.0)
Hemoglobin: 9.9 g/dL — ABNORMAL LOW (ref 13.0–17.0)
Immature Granulocytes: 12 %
Lymphocytes Relative: 3 %
Lymphs Abs: 0.8 10*3/uL (ref 0.7–4.0)
MCH: 28.6 pg (ref 26.0–34.0)
MCHC: 32.7 g/dL (ref 30.0–36.0)
MCV: 87.6 fL (ref 80.0–100.0)
Monocytes Absolute: 2.3 10*3/uL — ABNORMAL HIGH (ref 0.1–1.0)
Monocytes Relative: 7 %
Neutro Abs: 25.9 10*3/uL — ABNORMAL HIGH (ref 1.7–7.7)
Neutrophils Relative %: 78 %
Platelet Count: 224 10*3/uL (ref 150–400)
RBC: 3.46 MIL/uL — ABNORMAL LOW (ref 4.22–5.81)
RDW: 14.7 % (ref 11.5–15.5)
Smear Review: NORMAL
WBC Count: 33 10*3/uL — ABNORMAL HIGH (ref 4.0–10.5)
nRBC: 0.1 % (ref 0.0–0.2)

## 2022-08-31 LAB — COMPREHENSIVE METABOLIC PANEL
ALT: 18 U/L (ref 0–44)
AST: 24 U/L (ref 15–41)
Albumin: 3.6 g/dL (ref 3.5–5.0)
Alkaline Phosphatase: 85 U/L (ref 38–126)
Anion gap: 8 (ref 5–15)
BUN: 23 mg/dL (ref 8–23)
CO2: 26 mmol/L (ref 22–32)
Calcium: 9 mg/dL (ref 8.9–10.3)
Chloride: 99 mmol/L (ref 98–111)
Creatinine, Ser: 1.42 mg/dL — ABNORMAL HIGH (ref 0.61–1.24)
GFR, Estimated: 50 mL/min — ABNORMAL LOW (ref >60)
Glucose, Bld: 193 mg/dL — ABNORMAL HIGH (ref 70–99)
Potassium: 4.1 mmol/L (ref 3.5–5.1)
Sodium: 133 mmol/L — ABNORMAL LOW (ref 135–145)
Total Bilirubin: 0.4 mg/dL (ref 0.3–1.2)
Total Protein: 6.8 g/dL (ref 6.5–8.1)

## 2022-08-31 LAB — SAMPLE TO BLOOD BANK

## 2022-09-01 ENCOUNTER — Ambulatory Visit: Payer: Medicare HMO

## 2022-09-01 LAB — PSA: Prostatic Specific Antigen: 4.02 ng/mL — ABNORMAL HIGH (ref 0.00–4.00)

## 2022-09-04 ENCOUNTER — Other Ambulatory Visit: Payer: Self-pay

## 2022-09-04 DIAGNOSIS — C61 Malignant neoplasm of prostate: Secondary | ICD-10-CM

## 2022-09-04 DIAGNOSIS — D649 Anemia, unspecified: Secondary | ICD-10-CM

## 2022-09-05 ENCOUNTER — Inpatient Hospital Stay: Payer: Medicare HMO

## 2022-09-05 DIAGNOSIS — D649 Anemia, unspecified: Secondary | ICD-10-CM

## 2022-09-05 DIAGNOSIS — Z5111 Encounter for antineoplastic chemotherapy: Secondary | ICD-10-CM | POA: Diagnosis not present

## 2022-09-05 DIAGNOSIS — C61 Malignant neoplasm of prostate: Secondary | ICD-10-CM

## 2022-09-05 LAB — CBC WITH DIFFERENTIAL/PLATELET
Abs Immature Granulocytes: 0.22 10*3/uL — ABNORMAL HIGH (ref 0.00–0.07)
Basophils Absolute: 0 10*3/uL (ref 0.0–0.1)
Basophils Relative: 0 %
Eosinophils Absolute: 0 10*3/uL (ref 0.0–0.5)
Eosinophils Relative: 0 %
HCT: 29.1 % — ABNORMAL LOW (ref 39.0–52.0)
Hemoglobin: 9.6 g/dL — ABNORMAL LOW (ref 13.0–17.0)
Immature Granulocytes: 2 %
Lymphocytes Relative: 3 %
Lymphs Abs: 0.5 10*3/uL — ABNORMAL LOW (ref 0.7–4.0)
MCH: 29.2 pg (ref 26.0–34.0)
MCHC: 33 g/dL (ref 30.0–36.0)
MCV: 88.4 fL (ref 80.0–100.0)
Monocytes Absolute: 0.7 10*3/uL (ref 0.1–1.0)
Monocytes Relative: 5 %
Neutro Abs: 11.8 10*3/uL — ABNORMAL HIGH (ref 1.7–7.7)
Neutrophils Relative %: 90 %
Platelets: 143 10*3/uL — ABNORMAL LOW (ref 150–400)
RBC: 3.29 MIL/uL — ABNORMAL LOW (ref 4.22–5.81)
RDW: 15.4 % (ref 11.5–15.5)
WBC: 13.2 10*3/uL — ABNORMAL HIGH (ref 4.0–10.5)
nRBC: 0.4 % — ABNORMAL HIGH (ref 0.0–0.2)

## 2022-09-05 LAB — SAMPLE TO BLOOD BANK

## 2022-09-13 ENCOUNTER — Other Ambulatory Visit: Payer: Self-pay

## 2022-09-13 ENCOUNTER — Inpatient Hospital Stay: Payer: Medicare HMO

## 2022-09-13 ENCOUNTER — Inpatient Hospital Stay (HOSPITAL_BASED_OUTPATIENT_CLINIC_OR_DEPARTMENT_OTHER): Payer: Medicare HMO | Admitting: Oncology

## 2022-09-13 ENCOUNTER — Other Ambulatory Visit: Payer: Self-pay | Admitting: Oncology

## 2022-09-13 ENCOUNTER — Inpatient Hospital Stay: Payer: Medicare HMO | Attending: Oncology

## 2022-09-13 ENCOUNTER — Encounter: Payer: Self-pay | Admitting: Oncology

## 2022-09-13 VITALS — BP 136/53 | HR 96 | Temp 96.1°F | Resp 18 | Wt 231.5 lb

## 2022-09-13 DIAGNOSIS — D649 Anemia, unspecified: Secondary | ICD-10-CM

## 2022-09-13 DIAGNOSIS — I959 Hypotension, unspecified: Secondary | ICD-10-CM | POA: Insufficient documentation

## 2022-09-13 DIAGNOSIS — D631 Anemia in chronic kidney disease: Secondary | ICD-10-CM | POA: Diagnosis not present

## 2022-09-13 DIAGNOSIS — N1330 Unspecified hydronephrosis: Secondary | ICD-10-CM | POA: Insufficient documentation

## 2022-09-13 DIAGNOSIS — C61 Malignant neoplasm of prostate: Secondary | ICD-10-CM

## 2022-09-13 DIAGNOSIS — N183 Chronic kidney disease, stage 3 unspecified: Secondary | ICD-10-CM | POA: Insufficient documentation

## 2022-09-13 DIAGNOSIS — Z5111 Encounter for antineoplastic chemotherapy: Secondary | ICD-10-CM

## 2022-09-13 DIAGNOSIS — D701 Agranulocytosis secondary to cancer chemotherapy: Secondary | ICD-10-CM | POA: Diagnosis not present

## 2022-09-13 DIAGNOSIS — G620 Drug-induced polyneuropathy: Secondary | ICD-10-CM

## 2022-09-13 DIAGNOSIS — N1832 Chronic kidney disease, stage 3b: Secondary | ICD-10-CM | POA: Diagnosis not present

## 2022-09-13 DIAGNOSIS — E1122 Type 2 diabetes mellitus with diabetic chronic kidney disease: Secondary | ICD-10-CM | POA: Diagnosis not present

## 2022-09-13 DIAGNOSIS — T451X5A Adverse effect of antineoplastic and immunosuppressive drugs, initial encounter: Secondary | ICD-10-CM

## 2022-09-13 DIAGNOSIS — C7951 Secondary malignant neoplasm of bone: Secondary | ICD-10-CM | POA: Diagnosis not present

## 2022-09-13 LAB — CBC WITH DIFFERENTIAL/PLATELET
Abs Immature Granulocytes: 0.05 10*3/uL (ref 0.00–0.07)
Basophils Absolute: 0 10*3/uL (ref 0.0–0.1)
Basophils Relative: 0 %
Eosinophils Absolute: 0 10*3/uL (ref 0.0–0.5)
Eosinophils Relative: 0 %
HCT: 23 % — ABNORMAL LOW (ref 39.0–52.0)
Hemoglobin: 7.5 g/dL — ABNORMAL LOW (ref 13.0–17.0)
Immature Granulocytes: 1 %
Lymphocytes Relative: 9 %
Lymphs Abs: 0.8 10*3/uL (ref 0.7–4.0)
MCH: 29.8 pg (ref 26.0–34.0)
MCHC: 32.6 g/dL (ref 30.0–36.0)
MCV: 91.3 fL (ref 80.0–100.0)
Monocytes Absolute: 0.8 10*3/uL (ref 0.1–1.0)
Monocytes Relative: 9 %
Neutro Abs: 7.1 10*3/uL (ref 1.7–7.7)
Neutrophils Relative %: 81 %
Platelets: 366 10*3/uL (ref 150–400)
RBC: 2.52 MIL/uL — ABNORMAL LOW (ref 4.22–5.81)
RDW: 17.2 % — ABNORMAL HIGH (ref 11.5–15.5)
WBC: 8.7 10*3/uL (ref 4.0–10.5)
nRBC: 0 % (ref 0.0–0.2)

## 2022-09-13 LAB — TYPE AND SCREEN
Antibody Screen: NEGATIVE
Unit division: 0

## 2022-09-13 LAB — PREPARE RBC (CROSSMATCH)

## 2022-09-13 LAB — COMPREHENSIVE METABOLIC PANEL
ALT: 18 U/L (ref 0–44)
AST: 22 U/L (ref 15–41)
Albumin: 3.5 g/dL (ref 3.5–5.0)
Alkaline Phosphatase: 56 U/L (ref 38–126)
Anion gap: 7 (ref 5–15)
BUN: 28 mg/dL — ABNORMAL HIGH (ref 8–23)
CO2: 27 mmol/L (ref 22–32)
Calcium: 9 mg/dL (ref 8.9–10.3)
Chloride: 104 mmol/L (ref 98–111)
Creatinine, Ser: 1.46 mg/dL — ABNORMAL HIGH (ref 0.61–1.24)
GFR, Estimated: 48 mL/min — ABNORMAL LOW (ref >60)
Glucose, Bld: 159 mg/dL — ABNORMAL HIGH (ref 70–99)
Potassium: 4.4 mmol/L (ref 3.5–5.1)
Sodium: 138 mmol/L (ref 135–145)
Total Bilirubin: 0.3 mg/dL (ref 0.3–1.2)
Total Protein: 6.5 g/dL (ref 6.5–8.1)

## 2022-09-13 LAB — PSA: Prostatic Specific Antigen: 23.56 ng/mL — ABNORMAL HIGH (ref 0.00–4.00)

## 2022-09-13 LAB — BPAM RBC

## 2022-09-13 NOTE — Assessment & Plan Note (Signed)
Grade 1 Observation.  

## 2022-09-13 NOTE — Assessment & Plan Note (Addendum)
Due to chemotherapy as well anemia of CKD Symptomatic anemia, 1 PRBC transfusion this week.  Continue Vitron C daily.  Weekly lab monitoring.

## 2022-09-13 NOTE — Assessment & Plan Note (Signed)
Patient gets G-CSF on D3 

## 2022-09-13 NOTE — Assessment & Plan Note (Signed)
Treatment as listed above 

## 2022-09-13 NOTE — Assessment & Plan Note (Signed)
Creatinine is close to baseline.  Encourage oral hydration and avoid nephrotoxins.   

## 2022-09-13 NOTE — Progress Notes (Signed)
Hematology/Oncology Progress note [phone] [phone]      CHIEF COMPLAINTS/REASON FOR VISIT:  Metastatic prostate cancer  ASSESSMENT & PLAN:   Cancer Staging  Prostate cancer [facility]) Staging form: Prostate, AJCC 8th Edition - Clinical: Stage Unknown (rcTX, cNX) - Signed by Rickard Patience, MD on 07/13/2021 - Pathologic stage from 06/16/2022: Stage IVB (pNX, cM1b, Grade Group: 5) - Signed by Rickard Patience, MD on 06/16/2022   Prostate cancer [facility]) Prostate cancer, Gleason score 10, s/p RT[2022]- biochemical recurrence 06/30/2021 on Docetaxel Q3weeks x 6 cycles with D3 GCSF ADT therapy 08/15/2022 Eligard 45mg  Q6 months.- next due in Oct 2024  Labs are reviewed and discussed with patient and wife.  Hold chemotherapy as Hb <8, he will get PRBC transfusion.  1 week, repeat labs, if Hb >8, will proceed with Docetaxel dose decrease to 70mg /m2 with GCSF support.     CKD (chronic kidney disease) stage 3, GFR 30-59 ml/min (HCC) Creatinine is close to baseline.  Encourage oral hydration and avoid nephrotoxins.    Encounter for antineoplastic chemotherapy Treatment as listed above.   Chemotherapy induced neutropenia (HCC) Patient gets G-CSF on D3  Normocytic anemia Due to chemotherapy as well anemia of CKD Symptomatic anemia, 1 PRBC transfusion this week.  Continue Vitron C daily.  Weekly lab monitoring.   Chemotherapy-induced neuropathy (HCC) Grade 1 Observation.     Orders Placed This Encounter  Procedures   PSA    Standing Status:   Future    Standing Expiration Date:   09/20/2023   CBC with Differential    Standing Status:   Future    Standing Expiration Date:   09/20/2023   Comprehensive metabolic panel    Standing Status:   Future    Standing Expiration Date:   09/20/2023   CBC with Differential (Cancer Center Only)    Standing Status:   Standing    Number of Occurrences:   4    Standing Expiration Date:   09/13/2023   Sample to Blood Bank    Standing Status:    Standing    Number of Occurrences:   4    Standing Expiration Date:   09/13/2023   Type and screen    Standing Status:   Future    Number of Occurrences:   1    Standing Expiration Date:   09/13/2023    Follow up  3 weeks lab MD Docetaxel D3 GCSF  All questions were answered. The patient knows to call the clinic with any problems, questions or concerns.  Rickard Patience, MD, PhD [facility] Hematology Oncology 09/13/2022   HISTORY OF PRESENTING ILLNESS:   Keith Howe is a  82 y.o.  male presents for follow up for prostate cancer Oncology History  Prostate cancer (HCC)  02/24/2020 Tumor Marker   PSA at Dr..Wolff's office 5.7.Marland Kitchen   03/17/2020 Imaging    MRI of the prostate showed PI-RADS category 5 lesion of the left posterolateral and posteromedial peripheral zone in the base, mid gland, and apex, with involvement of the left central zone at the base. This lesion bulges the prosthetic capsular margin adjacent to the rectum, and there is a high suspicion for extraprostatic spread and early seminal vesicle invasion and likely left neurovascular bundle involvement. PI-RADS category 2 lesion in the right peripheral zone,considered PI-RADS category 2 given the nonfocal nature.. Encapsulated nodularity in the transition zone compatible with benign prostatic hypertrophy. Prostate volume 71.50 cubic cm.    04/08/2020 Initial Diagnosis   Prostate cancer (  HCC) prostate cancer history dated back to 04/08/20.  Prostate biopsy showed prostate adenocarcinoma, 12 out of 12 cores involved.  Gleason score 10 [5+5] 2 cores, Gleason 9 [5+4] 1 core, Gleason 9 [4+5] involving 8 cores.     05/13/2020 Imaging   CT abdomen pelvis without contrast showed moderately enlarged prostate, no evidence of abdominal or pelvic metastatic disease.    05/13/2020 Imaging   bone scan whole body showed no evidence of osseous metastasis    06/04/2020 -  Radiation Therapy   patient was seen by Dr. Rushie Chestnut and underwent IMRT to  prostate and pelvic nodes.  Dr. Rushie Chestnut asked Dr. Evelene Croon to give adjuvant deprivation therapy.    12/29/2020 Tumor Marker   PSA 0.07   06/22/2021 -  Chemotherapy   Orgovyx 120mg  daily Xtandi 160mg  daily   07/01/2021 Tumor Marker   PSA 4.3 Patient was referred by Dr. Rushie Chestnut to reestablish care with oncology for evaluation.  He was previously seen by me in the hematology clinic for abnormal SPEP and lost follow-up.  Oncology was not involved previously for treatment of prostate cancer.    07/13/2021 Cancer Staging   Staging form: Prostate, AJCC 8th Edition - Clinical: Stage Unknown (rcTX, cNX) - Signed by Rickard Patience, MD on 07/13/2021 Stage prefix: Recurrence   10/04/2021 Tumor Marker   PSA at Urologist office  <0.1   01/02/2022 Tumor Marker   PSA at urologist office 0.5   05/26/2022 Tumor Marker   PSA 9.59  Previous prostate cancer treatment was with Dr. Evelene Croon.  05/26/2022  Patient presented to reestablish care as Dr. Evelene Croon retired   06/15/2022 Imaging   PMSA showed Several radiotracer avid bone metastases in the left scapula, thoracic spine, and bilateral ribs. No evidence of soft tissue metastatic disease. Stable enlarged prostate, with findings of chronic bladder outlet obstruction and vesicoureteral reflux. Aortic Atherosclerosis     06/16/2022 Cancer Staging   Staging form: Prostate, AJCC 8th Edition - Pathologic stage from 06/16/2022: Stage IVB (pNX, cM1b, Grade Group: 5) - Signed by Rickard Patience, MD on 06/16/2022 Histologic grading system: 5 grade system   06/16/2022 Tumor Marker   PSA  21.45   06/28/2022 -  Chemotherapy   Patient is on Treatment Plan : PROSTATE Docetaxel (75)     07/31/2022 - 08/02/2022 Hospital Admission   Admitted due to near syncope, Hb 6.9, s/p PRBC transfusion x 2 units.  No bleeding history. SIRS, infectious work up negative. AKI on CKD.      INTERVAL HISTORY Keith Howe is a 82 y.o. male who has above history reviewed by me today presents for follow up  visit for prostate cancer. S/p 3 cycles of Docetaxel with GCSF supports.  Accompanied by wife today. He has gained weight. Appetite is fair.  + slight increase of fatigue Intermittent fingertip numbness No new complaints.   Review of Systems  Constitutional:  Positive for fatigue. Negative for appetite change, chills, fever and unexpected weight change.  HENT:   Negative for hearing loss and voice change.   Eyes:  Negative for eye problems and icterus.  Respiratory:  Negative for chest tightness, cough and shortness of breath.   Cardiovascular:  Negative for chest pain and leg swelling.  Gastrointestinal:  Negative for abdominal distention and abdominal pain.  Endocrine: Negative for hot flashes.  Genitourinary:  Negative for difficulty urinating, dysuria and frequency.   Musculoskeletal:  Negative for arthralgias.  Skin:  Negative for itching and rash.  Neurological:  Negative for light-headedness  and numbness.  Hematological:  Negative for adenopathy. Does not bruise/bleed easily.  Psychiatric/Behavioral:  Negative for confusion.     MEDICAL HISTORY:  Past Medical History:  Diagnosis Date   BPH (benign prostatic hyperplasia)    Cancer (HCC)    Chronic kidney disease    STAGE 3   Diabetes mellitus without complication (HCC)    HOH (hard of hearing)    AIDS   Hypertension    Inguinal hernia    Pain    CHRONIC LBP   Prostate cancer [facility])     SURGICAL HISTORY: Past Surgical History:  Procedure Laterality Date   BACK SURGERY     Lumbar   CATARACT EXTRACTION W/PHACO Right 07/10/2017   Procedure: CATARACT EXTRACTION PHACO AND INTRAOCULAR LENS PLACEMENT (IOC);  Surgeon: Galen Manila, MD;  Location: [facility] ORS;  Service: Ophthalmology;  Laterality: Right;  [place] 00:29.2 AP% 16.5 CDE 4.83 Fluid Pack Lot # [id number]   COLONOSCOPY     EYE SURGERY Bilateral    cataract extraction   HERNIA REPAIR     INGUINAL   PROSTATE BIOPSY N/A 04/08/2020   Procedure: PROSTATE BIOPSY  Addison Bailey;  Surgeon: Orson Ape, MD;  Location: [facility] ORS;  Service: Urology;  Laterality: N/A;   TOTAL KNEE ARTHROPLASTY Left 03/19/2018   Procedure: TOTAL KNEE ARTHROPLASTY;  Surgeon: Juanell Fairly, MD;  Location: [facility] ORS;  Service: Orthopedics;  Laterality: Left;    SOCIAL HISTORY: Social History   Socioeconomic History   Marital status: Married    Spouse name: Not on file   Number of children: Not on file   Years of education: Not on file   Highest education level: Some college, no degree  Occupational History   Not on file  Tobacco Use   Smoking status: Never   Smokeless tobacco: Never  Vaping Use   Vaping Use: Never used  Substance and Sexual Activity   Alcohol use: No   Drug use: Never   Sexual activity: Not Currently  Other Topics Concern   Not on file  Social History Narrative   Independent at baseline.  Lives at home with his wife   Social Determinants of Health   Financial Resource Strain: Low Risk  (05/12/2021)   Overall Financial Resource Strain (CARDIA)    Difficulty of Paying Living Expenses: Not hard at all  Food Insecurity: No Food Insecurity (07/31/2022)   Hunger Vital Sign    Worried About Running Out of Food in the Last Year: Never true    Ran Out of Food in the Last Year: Never true  Transportation Needs: No Transportation Needs (07/31/2022)   PRAPARE - Administrator, Civil Service (Medical): No    Lack of Transportation (Non-Medical): No  Physical Activity: Insufficiently Active (05/12/2021)   Exercise Vital Sign    Days of Exercise per Week: 7 days    Minutes of Exercise per Session: 10 min  Stress: No Stress Concern Present (05/12/2021)   [employer] of Occupational Health - Occupational Stress Questionnaire    Feeling of Stress : Not at all  Social Connections: Socially Integrated (05/12/2021)   Social Connection and Isolation Panel [NHANES]    Frequency of Communication with Friends and Family: More than three times a week     Frequency of Social Gatherings with Friends and Family: Once a week    Attends Religious Services: More than 4 times per year    Active Member of [employer] or Organizations: Yes    Attends Club or  Organization Meetings: More than 4 times per year    Marital Status: Married  Catering manager Violence: Not At Risk (07/31/2022)   Humiliation, Afraid, Rape, and Kick questionnaire    Fear of Current or Ex-Partner: No    Emotionally Abused: No    Physically Abused: No    Sexually Abused: No    FAMILY HISTORY: Family History  Problem Relation Age of Onset   Diabetes Father    Breast cancer Sister     ALLERGIES:  has No Known Allergies.  MEDICATIONS:  Current Outpatient Medications  Medication Sig Dispense Refill   amLODipine (NORVASC) 5 MG tablet Take 2.5 mg by mouth daily.     aspirin EC 81 MG tablet Take 81 mg by mouth daily.     cholecalciferol (VITAMIN D) 1000 units tablet Take 1,000 Units by mouth daily.     dapagliflozin propanediol (FARXIGA) 10 MG TABS tablet Take by mouth daily.     dexamethasone (DECADRON) 4 MG tablet Take 2 tabs by mouth daily starting the day before chemo. Then take 2 tabs daily for 2 days starting day after chemo. Take with food. 36 tablet 1   Iron-Vitamin C 65-125 MG TABS Take 1 tablet by mouth daily. 30 tablet 3   loratadine (CLARITIN) 10 MG tablet Take 10 mg by mouth once daily before chemo and at least 2 days after injection.     losartan (COZAAR) 100 MG tablet Take 100 mg by mouth daily.     lovastatin (MEVACOR) 40 MG tablet TAKE 1 TABLET BY MOUTH EVERY DAY 90 tablet 3   metoprolol tartrate (LOPRESSOR) 50 MG tablet TAKE 1 TABLET BY MOUTH EVERY 12 HOURS 180 tablet 2   Misc Natural Products (PROSTATE SUPPORT PO) Take 2 capsules by mouth daily.     NOVOLOG FLEXPEN 100 UNIT/ML FlexPen Inject into the skin. Per sliding scale  0   Omega-3 Fatty Acids (FISH OIL PO) Take 1 capsule by mouth daily.      ondansetron (ZOFRAN) 8 MG tablet Take 1 tablet (8 mg total) by  mouth every 8 (eight) hours as needed for nausea or vomiting. 30 tablet 1   prochlorperazine (COMPAZINE) 10 MG tablet Take 1 tablet (10 mg total) by mouth every 6 (six) hours as needed for nausea or vomiting. 30 tablet 1   TRESIBA FLEXTOUCH 200 UNIT/ML SOPN Inject 80 Units into the muscle daily before breakfast.   2   TRULICITY 1.5 MG/0.5ML SOPN Inject into the skin.     vitamin B-12 (CYANOCOBALAMIN) 500 MCG tablet Take 500 mcg by mouth daily.     No current facility-administered medications for this visit.     PHYSICAL EXAMINATION: ECOG PERFORMANCE STATUS: 1 - Symptomatic but completely ambulatory Vitals:   09/13/22 0859  BP: (!) 136/53  Pulse: 96  Resp: 18  Temp: (!) 96.1 F (35.6 C)  SpO2: 100%   Filed Weights   09/13/22 0859  Weight: 231 lb 8 oz (105 kg)    Physical Exam Constitutional:      General: He is not in acute distress. HENT:     Head: Normocephalic and atraumatic.  Eyes:     General: No scleral icterus. Cardiovascular:     Rate and Rhythm: Normal rate and regular rhythm.     Heart sounds: Normal heart sounds.  Pulmonary:     Effort: Pulmonary effort is normal. No respiratory distress.     Breath sounds: No wheezing.  Abdominal:     General: Bowel sounds are normal.  There is no distension.     Palpations: Abdomen is soft.  Musculoskeletal:        General: No deformity. Normal range of motion.     Cervical back: Normal range of motion and neck supple.  Skin:    General: Skin is warm and dry.     Findings: No erythema or rash.  Neurological:     Mental Status: He is alert and oriented to person, place, and time. Mental status is at baseline.     Cranial Nerves: No cranial nerve deficit.     Coordination: Coordination normal.  Psychiatric:        Mood and Affect: Mood normal.     LABORATORY DATA:  I have reviewed the data as listed    Latest Ref Rng & Units 09/13/2022    8:49 AM 09/05/2022    9:28 AM 08/31/2022   10:53 AM  CBC  WBC 4.0 - 10.5  K/uL 8.7  13.2  33.0   Hemoglobin 13.0 - 17.0 g/dL 7.5  9.6  9.9   Hematocrit 39.0 - 52.0 % 23.0  29.1  30.3   Platelets 150 - 400 K/uL 366  143  224       Latest Ref Rng & Units 09/13/2022    8:49 AM 08/31/2022   10:53 AM 08/23/2022    8:51 AM  CMP  Glucose 70 - 99 mg/dL 621  308  657   BUN 8 - 23 mg/dL 28  23  31    Creatinine 0.61 - 1.24 mg/dL 8.46  9.62  9.52   Sodium 135 - 145 mmol/L 138  133  138   Potassium 3.5 - 5.1 mmol/L 4.4  4.1  4.0   Chloride 98 - 111 mmol/L 104  99  105   CO2 22 - 32 mmol/L 27  26  25    Calcium 8.9 - 10.3 mg/dL 9.0  9.0  9.2   Total Protein 6.5 - 8.1 g/dL 6.5  6.8  6.8   Total Bilirubin 0.3 - 1.2 mg/dL 0.3  0.4  0.4   Alkaline Phos 38 - 126 U/L 56  85  59   AST 15 - 41 U/L 22  24  22    ALT 0 - 44 U/L 18  18  17      RADIOGRAPHIC STUDIES: I have personally reviewed the radiological images as listed and agreed with the findings in the report. No results found.

## 2022-09-13 NOTE — Assessment & Plan Note (Addendum)
Prostate cancer, Gleason score 10, s/p RT[2022]- biochemical recurrence 06/30/2021 on Docetaxel Q3weeks x 6 cycles with D3 GCSF ADT therapy 08/15/2022 Eligard 45mg  Q6 months.- next due in Oct 2024  Labs are reviewed and discussed with patient and wife.  Hold chemotherapy as Hb <8, he will get PRBC transfusion.  1 week, repeat labs, if Hb >8, will proceed with Docetaxel dose decrease to 70mg /m2 with GCSF support.

## 2022-09-14 ENCOUNTER — Inpatient Hospital Stay: Payer: Medicare HMO

## 2022-09-14 ENCOUNTER — Other Ambulatory Visit: Payer: Self-pay

## 2022-09-14 DIAGNOSIS — C61 Malignant neoplasm of prostate: Secondary | ICD-10-CM

## 2022-09-14 DIAGNOSIS — D649 Anemia, unspecified: Secondary | ICD-10-CM

## 2022-09-14 LAB — TYPE AND SCREEN

## 2022-09-14 MED ORDER — DIPHENHYDRAMINE HCL 25 MG PO CAPS
25.0000 mg | ORAL_CAPSULE | Freq: Once | ORAL | Status: AC
  Administered 2022-09-14: 25 mg via ORAL
  Filled 2022-09-14: qty 1

## 2022-09-14 MED ORDER — ACETAMINOPHEN 325 MG PO TABS
650.0000 mg | ORAL_TABLET | Freq: Once | ORAL | Status: AC
  Administered 2022-09-14: 650 mg via ORAL
  Filled 2022-09-14: qty 2

## 2022-09-14 MED ORDER — SODIUM CHLORIDE 0.9% IV SOLUTION
250.0000 mL | Freq: Once | INTRAVENOUS | Status: AC
  Administered 2022-09-14: 250 mL via INTRAVENOUS
  Filled 2022-09-14: qty 250

## 2022-09-14 NOTE — Progress Notes (Signed)
Nutrition Follow-up:  Patient with prostate cancer.  Receiving docetaxel currently on hold due to low hemoglobin.  Receiving blood today.  Met with patient and wife during infusion.  Patient reports that appetite has been good.  Reports that for breakfast usually eating eggs, grits, bacon/sausage. Lunch is usually a glucerna.  Dinner is meat and couple of sides (fish, coleslaw, chicken, corn on cob, mashed potatoes).  Has been eating yogurt, popcorn, small amount of ice cream, fruit.  Sometimes has nausea.  Bowels are moving.  Tries to drink glucerna again before bed    Medications: reviewed  Labs: glucose 159, BUN 28, creatinine 1.46, Hgb 7.5  Anthropometrics:   Weight 231 lb 8 oz on 5/8 229 lb 6.4 oz on 4/3 240s lb 07/05/22   NUTRITION DIAGNOSIS: Inadequate oral intake stable    INTERVENTION:  Continue glucerna BID for added calories and protein Continue to include good sources of protein at meal times.     MONITORING, EVALUATION, GOAL: weight trends, intake   NEXT VISIT: Wednesday, June 5 during infusion  Mikalyn Hermida B. Freida Busman, RD, LDN Registered Dietitian [phone]

## 2022-09-15 ENCOUNTER — Inpatient Hospital Stay: Payer: Medicare HMO

## 2022-09-15 LAB — TYPE AND SCREEN: ABO/RH(D): B POS

## 2022-09-15 LAB — BPAM RBC
Blood Product Expiration Date: 202406012359
ISSUE DATE / TIME: 202405090847
Unit Type and Rh: 7300

## 2022-09-19 MED FILL — Dexamethasone Sodium Phosphate Inj 100 MG/10ML: INTRAMUSCULAR | Qty: 1 | Status: AC

## 2022-09-20 ENCOUNTER — Inpatient Hospital Stay: Payer: Medicare HMO

## 2022-09-20 ENCOUNTER — Other Ambulatory Visit: Payer: Self-pay | Admitting: Oncology

## 2022-09-20 ENCOUNTER — Inpatient Hospital Stay (HOSPITAL_BASED_OUTPATIENT_CLINIC_OR_DEPARTMENT_OTHER): Payer: Medicare HMO | Admitting: Oncology

## 2022-09-20 ENCOUNTER — Ambulatory Visit
Admission: RE | Admit: 2022-09-20 | Discharge: 2022-09-20 | Disposition: A | Discharge status: Home / Self Care | Payer: Medicare HMO | Source: Ambulatory Visit | Attending: Oncology | Admitting: Oncology

## 2022-09-20 ENCOUNTER — Inpatient Hospital Stay: Admit: 2022-09-20 | Payer: Self-pay

## 2022-09-20 ENCOUNTER — Other Ambulatory Visit: Payer: Self-pay

## 2022-09-20 ENCOUNTER — Telehealth: Payer: Self-pay

## 2022-09-20 ENCOUNTER — Other Ambulatory Visit: Payer: Medicare HMO

## 2022-09-20 ENCOUNTER — Ambulatory Visit: Payer: Medicare HMO

## 2022-09-20 ENCOUNTER — Encounter: Payer: Self-pay | Admitting: Oncology

## 2022-09-20 ENCOUNTER — Ambulatory Visit: Payer: Medicare HMO | Admitting: Oncology

## 2022-09-20 VITALS — BP 100/50 | HR 95 | Temp 97.7°F | Resp 18

## 2022-09-20 DIAGNOSIS — D7589 Other specified diseases of blood and blood-forming organs: Secondary | ICD-10-CM | POA: Diagnosis not present

## 2022-09-20 DIAGNOSIS — C61 Malignant neoplasm of prostate: Secondary | ICD-10-CM

## 2022-09-20 DIAGNOSIS — N1832 Chronic kidney disease, stage 3b: Secondary | ICD-10-CM

## 2022-09-20 DIAGNOSIS — I10 Essential (primary) hypertension: Secondary | ICD-10-CM

## 2022-09-20 DIAGNOSIS — T451X5A Adverse effect of antineoplastic and immunosuppressive drugs, initial encounter: Secondary | ICD-10-CM

## 2022-09-20 DIAGNOSIS — D649 Anemia, unspecified: Secondary | ICD-10-CM | POA: Insufficient documentation

## 2022-09-20 DIAGNOSIS — R531 Weakness: Secondary | ICD-10-CM | POA: Diagnosis not present

## 2022-09-20 DIAGNOSIS — G620 Drug-induced polyneuropathy: Secondary | ICD-10-CM | POA: Diagnosis not present

## 2022-09-20 DIAGNOSIS — N1330 Unspecified hydronephrosis: Secondary | ICD-10-CM | POA: Insufficient documentation

## 2022-09-20 DIAGNOSIS — N1339 Other hydronephrosis: Secondary | ICD-10-CM

## 2022-09-20 LAB — CBC WITH DIFFERENTIAL/PLATELET
Abs Immature Granulocytes: 0.1 10*3/uL — ABNORMAL HIGH (ref 0.00–0.07)
Basophils Absolute: 0 10*3/uL (ref 0.0–0.1)
Basophils Relative: 0 %
Eosinophils Absolute: 0 10*3/uL (ref 0.0–0.5)
Eosinophils Relative: 0 %
HCT: 18.5 % — ABNORMAL LOW (ref 39.0–52.0)
Hemoglobin: 5.7 g/dL — CL (ref 13.0–17.0)
Immature Granulocytes: 1 %
Lymphocytes Relative: 5 %
Lymphs Abs: 0.5 10*3/uL — ABNORMAL LOW (ref 0.7–4.0)
MCH: 28.6 pg (ref 26.0–34.0)
MCHC: 30.8 g/dL (ref 30.0–36.0)
MCV: 93 fL (ref 80.0–100.0)
Monocytes Absolute: 0.6 10*3/uL (ref 0.1–1.0)
Monocytes Relative: 6 %
Neutro Abs: 10 10*3/uL — ABNORMAL HIGH (ref 1.7–7.7)
Neutrophils Relative %: 88 %
Platelets: 311 10*3/uL (ref 150–400)
RBC: 1.99 MIL/uL — ABNORMAL LOW (ref 4.22–5.81)
RDW: 16.4 % — ABNORMAL HIGH (ref 11.5–15.5)
WBC: 11.2 10*3/uL — ABNORMAL HIGH (ref 4.0–10.5)
nRBC: 0 % (ref 0.0–0.2)

## 2022-09-20 LAB — IRON AND TIBC
Iron: 87 ug/dL (ref 45–182)
Saturation Ratios: 29 % (ref 17.9–39.5)
TIBC: 298 ug/dL (ref 250–450)
UIBC: 211 ug/dL

## 2022-09-20 LAB — COMPREHENSIVE METABOLIC PANEL
ALT: 16 U/L (ref 0–44)
AST: 34 U/L (ref 15–41)
Albumin: 3.1 g/dL — ABNORMAL LOW (ref 3.5–5.0)
Alkaline Phosphatase: 36 U/L — ABNORMAL LOW (ref 38–126)
Anion gap: 15 (ref 5–15)
BUN: 83 mg/dL — ABNORMAL HIGH (ref 8–23)
CO2: 17 mmol/L — ABNORMAL LOW (ref 22–32)
Calcium: 8.6 mg/dL — ABNORMAL LOW (ref 8.9–10.3)
Chloride: 106 mmol/L (ref 98–111)
Creatinine, Ser: 1.94 mg/dL — ABNORMAL HIGH (ref 0.61–1.24)
GFR, Estimated: 34 mL/min — ABNORMAL LOW (ref >60)
Glucose, Bld: 258 mg/dL — ABNORMAL HIGH (ref 70–99)
Potassium: 4.4 mmol/L (ref 3.5–5.1)
Sodium: 138 mmol/L (ref 135–145)
Total Bilirubin: 0.3 mg/dL (ref 0.3–1.2)
Total Protein: 5.7 g/dL — ABNORMAL LOW (ref 6.5–8.1)

## 2022-09-20 LAB — PREPARE RBC (CROSSMATCH)

## 2022-09-20 LAB — TYPE AND SCREEN
Unit division: 0
Unit division: 0

## 2022-09-20 LAB — BPAM RBC
Blood Product Expiration Date: 202406092359
Unit Type and Rh: 7300
Unit Type and Rh: 7300

## 2022-09-20 LAB — PSA: Prostatic Specific Antigen: 70.31 ng/mL — ABNORMAL HIGH (ref 0.00–4.00)

## 2022-09-20 LAB — FERRITIN: Ferritin: 67 ng/mL (ref 24–336)

## 2022-09-20 MED ORDER — SODIUM CHLORIDE 0.9 % IV SOLN
Freq: Once | INTRAVENOUS | Status: AC
  Filled 2022-09-20: qty 250

## 2022-09-20 NOTE — Assessment & Plan Note (Addendum)
Hemoglobin 5.7, I recommend patient to get 2 units of PRBC blood transfusion. Anemia is likely secondary to bone marrow suppression as well as anemia due to CKD. Iron panel showed ferritin less than 200. Recommend IV Venofer treatments. Stat CT abdomen pelvis without contrast showed no retroperitoneal bleeding.

## 2022-09-20 NOTE — Assessment & Plan Note (Signed)
Grade 1 Observation.

## 2022-09-20 NOTE — Assessment & Plan Note (Signed)
Today BP is soft 100/50. Patient will receive 500 cc of normal saline for hydration Encourage patient to measure blood pressure at home. If SBP is less than 110, hold off losartan and amlodipine. If SBP is less than 100, hold off all BP meds.

## 2022-09-20 NOTE — Assessment & Plan Note (Addendum)
Prostate cancer, Gleason score 10, s/p RT[2022]- biochemical recurrence 06/30/2021 on Docetaxel Q3weeks x 6 cycles with D3 GCSF ADT therapy 08/15/2022 Eligard 45mg  Q6 months.- next due in Oct 2024  Labs are reviewed and discussed with patient and wife.  Hold chemotherapy due to symptomatic anemia. Patient has received total 3 cycles of docetaxel.  Has developed recurrent severe anemia. I will hold off additional chemotherapy at this point. PSA also came back further elevated, possible resistance to chemotherapy.

## 2022-09-20 NOTE — Assessment & Plan Note (Addendum)
Kidney function is worse.  Likely secondary to post renal obstruction. Encourage oral hydration and avoid nephrotoxins.

## 2022-09-20 NOTE — Patient Instructions (Signed)
[place] [facility]  Discharge Instructions: Thank you for choosing [facility] to provide your oncology and hematology care.  If you have a lab appointment with the Cancer Center, please go directly to the Cancer Center and check in at the registration area.  Wear comfortable clothing and clothing appropriate for easy access to any Portacath or PICC line.   We strive to give you quality time with your provider. You may need to reschedule your appointment if you arrive late (15 or more minutes).  Arriving late affects you and other patients whose appointments are after yours.  Also, if you miss three or more appointments without notifying the office, you may be dismissed from the clinic at the provider's discretion.      For prescription refill requests, have your pharmacy contact our office and allow 72 hours for refills to be completed.    Today you received the following chemotherapy and/or immunotherapy agents: fluids      To help prevent nausea and vomiting after your treatment, we encourage you to take your nausea medication as directed.  BELOW ARE SYMPTOMS THAT SHOULD BE REPORTED IMMEDIATELY: *FEVER GREATER THAN 100.4 F (38 C) OR HIGHER *CHILLS OR SWEATING *NAUSEA AND VOMITING THAT IS NOT CONTROLLED WITH YOUR NAUSEA MEDICATION *UNUSUAL SHORTNESS OF BREATH *UNUSUAL BRUISING OR BLEEDING *URINARY PROBLEMS (pain or burning when urinating, or frequent urination) *BOWEL PROBLEMS (unusual diarrhea, constipation, pain near the anus) TENDERNESS IN MOUTH AND THROAT WITH OR WITHOUT PRESENCE OF ULCERS (sore throat, sores in mouth, or a toothache) UNUSUAL RASH, SWELLING OR PAIN  UNUSUAL VAGINAL DISCHARGE OR ITCHING   Items with * indicate a potential emergency and should be followed up as soon as possible or go to the Emergency Department if any problems should occur.  Please show the CHEMOTHERAPY ALERT CARD or IMMUNOTHERAPY ALERT CARD at check-in to  the Emergency Department and triage nurse.  Should you have questions after your visit or need to cancel or reschedule your appointment, please contact [place] [facility] AT [facility]  [phone] and follow the prompts.  Office hours are 8:00 a.m. to 4:30 p.m. Monday - Friday. Please note that voicemails left after 4:00 p.m. may not be returned until the following business day.  We are closed weekends and major holidays. You have access to a nurse at all times for urgent questions. Please call the main number to the clinic [phone] and follow the prompts.  For any non-urgent questions, you may also contact your provider using MyChart. We now offer e-Visits for anyone 66 and older to request care online for non-urgent symptoms. For details visit [email or web address].   Also download the MyChart app! Go to the app store, search "MyChart", open the app, select , and log in with your MyChart username and password.

## 2022-09-20 NOTE — Assessment & Plan Note (Signed)
CT scan showed new hydronephrosis bilaterally.  Likely due to chronic outlet obstruction. Will refer to urology urgently for evaluation.

## 2022-09-20 NOTE — Telephone Encounter (Signed)
CRITICAL VALUE STICKER  CRITICAL VALUE: hemoglobin 5.7  RECEIVER (on-site recipient of call): Merleen Nicely, RN   DATE & TIME NOTIFIED: 09/20/2022 @ 9:01a  MESSENGER (representative from lab): Gershon Mussel  MD NOTIFIED: Rickard Patience, MD  TIME OF NOTIFICATION: [id number]  @ 9:10a  RESPONSE: pt will be scheduled for 2 units of blood

## 2022-09-20 NOTE — Progress Notes (Addendum)
Hematology/Oncology Progress note [phone] [phone]      CHIEF COMPLAINTS/REASON FOR VISIT:  Metastatic prostate cancer  ASSESSMENT & PLAN:   Cancer Staging  Prostate cancer [facility]) Staging form: Prostate, AJCC 8th Edition - Clinical: Stage Unknown (rcTX, cNX) - Signed by Rickard Patience, MD on 07/13/2021 - Pathologic stage from 06/16/2022: Stage IVB (pNX, cM1b, Grade Group: 5) - Signed by Rickard Patience, MD on 06/16/2022   Prostate cancer [facility]) Prostate cancer, Gleason score 10, s/p RT[2022]- biochemical recurrence 06/30/2021 on Docetaxel Q3weeks x 6 cycles with D3 GCSF ADT therapy 08/15/2022 Eligard 45mg  Q6 months.- next due in Oct 2024  Labs are reviewed and discussed with patient and wife.  Hold chemotherapy due to symptomatic anemia. Patient has received total 3 cycles of docetaxel.  Has developed recurrent severe anemia. I will hold off additional chemotherapy at this point. PSA also came back further elevated, possible resistance to chemotherapy.     CKD (chronic kidney disease) stage 3, GFR 30-59 ml/min (HCC) Kidney function is worse.  Likely secondary to post renal obstruction. Encourage oral hydration and avoid nephrotoxins.    Normocytic anemia Hemoglobin 5.7, I recommend patient to get 2 units of PRBC blood transfusion. Anemia is likely secondary to bone marrow suppression as well as anemia due to CKD. Iron panel showed ferritin less than 200. Recommend IV Venofer treatments. Stat CT abdomen pelvis without contrast showed no retroperitoneal bleeding.  Chemotherapy-induced neuropathy (HCC) Grade 1 Observation.   Essential hypertension Today BP is soft 100/50. Patient will receive 500 cc of normal saline for hydration Encourage patient to measure blood pressure at home. If SBP is less than 110, hold off losartan and amlodipine. If SBP is less than 100, hold off all BP meds.  Hydronephrosis CT scan showed new hydronephrosis bilaterally.  Likely due to  chronic outlet obstruction. Will refer to urology urgently for evaluation.      Orders Placed This Encounter  Procedures   CBC with Differential (Cancer Center Only)    Standing Status:   Future    Standing Expiration Date:   09/20/2023   CBC with Differential (Cancer Center Only)    Standing Status:   Future    Standing Expiration Date:   09/20/2023   CMP (Cancer Center only)    Standing Status:   Future    Standing Expiration Date:   09/20/2023   PSA    Standing Status:   Future    Standing Expiration Date:   09/20/2023   Sample to Blood Bank    Standing Status:   Future    Standing Expiration Date:   09/20/2023   Sample to Blood Bank    Standing Status:   Future    Standing Expiration Date:   09/20/2023    Follow up  1 week lab NP +/- PRBC +/- Venofer. 2 weeks lab MD +/- PRBC  All questions were answered. The patient knows to call the clinic with any problems, questions or concerns.  Rickard Patience, MD, PhD [facility] Hematology Oncology 09/20/2022   HISTORY OF PRESENTING ILLNESS:   Keith Howe is a  82 y.o.  male presents for follow up for prostate cancer Oncology History  Prostate cancer (HCC)  02/24/2020 Tumor Marker   PSA at Dr..Wolff's office 5.7.Marland Kitchen   03/17/2020 Imaging    MRI of the prostate showed PI-RADS category 5 lesion of the left posterolateral and posteromedial peripheral zone in the base, mid gland, and apex, with involvement of the left central zone  at the base. This lesion bulges the prosthetic capsular margin adjacent to the rectum, and there is a high suspicion for extraprostatic spread and early seminal vesicle invasion and likely left neurovascular bundle involvement. PI-RADS category 2 lesion in the right peripheral zone,considered PI-RADS category 2 given the nonfocal nature.. Encapsulated nodularity in the transition zone compatible with benign prostatic hypertrophy. Prostate volume 71.50 cubic cm.    04/08/2020 Initial Diagnosis   Prostate cancer  (HCC) prostate cancer history dated back to 04/08/20.  Prostate biopsy showed prostate adenocarcinoma, 12 out of 12 cores involved.  Gleason score 10 [5+5] 2 cores, Gleason 9 [5+4] 1 core, Gleason 9 [4+5] involving 8 cores.     05/13/2020 Imaging   CT abdomen pelvis without contrast showed moderately enlarged prostate, no evidence of abdominal or pelvic metastatic disease.    05/13/2020 Imaging   bone scan whole body showed no evidence of osseous metastasis    06/04/2020 -  Radiation Therapy   patient was seen by Dr. Rushie Chestnut and underwent IMRT to prostate and pelvic nodes.  Dr. Rushie Chestnut asked Dr. Evelene Croon to give adjuvant deprivation therapy.    12/29/2020 Tumor Marker   PSA 0.07   06/22/2021 -  Chemotherapy   Orgovyx 120mg  daily Xtandi 160mg  daily   07/01/2021 Tumor Marker   PSA 4.3 Patient was referred by Dr. Rushie Chestnut to reestablish care with oncology for evaluation.  He was previously seen by me in the hematology clinic for abnormal SPEP and lost follow-up.  Oncology was not involved previously for treatment of prostate cancer.    07/13/2021 Cancer Staging   Staging form: Prostate, AJCC 8th Edition - Clinical: Stage Unknown (rcTX, cNX) - Signed by Rickard Patience, MD on 07/13/2021 Stage prefix: Recurrence   10/04/2021 Tumor Marker   PSA at Urologist office  <0.1   01/02/2022 Tumor Marker   PSA at urologist office 0.5   05/26/2022 Tumor Marker   PSA 9.59  Previous prostate cancer treatment was with Dr. Evelene Croon.  05/26/2022  Patient presented to reestablish care as Dr. Evelene Croon retired   06/15/2022 Imaging   PMSA showed Several radiotracer avid bone metastases in the left scapula, thoracic spine, and bilateral ribs. No evidence of soft tissue metastatic disease. Stable enlarged prostate, with findings of chronic bladder outlet obstruction and vesicoureteral reflux. Aortic Atherosclerosis     06/16/2022 Cancer Staging   Staging form: Prostate, AJCC 8th Edition - Pathologic stage from 06/16/2022: Stage  IVB (pNX, cM1b, Grade Group: 5) - Signed by Rickard Patience, MD on 06/16/2022 Histologic grading system: 5 grade system   06/16/2022 Tumor Marker   PSA  21.45   06/28/2022 -  Chemotherapy   Patient is on Treatment Plan : PROSTATE Docetaxel (75)     07/31/2022 - 08/02/2022 Hospital Admission   Admitted due to near syncope, Hb 6.9, s/p PRBC transfusion x 2 units.  No bleeding history. SIRS, infectious work up negative. AKI on CKD.      INTERVAL HISTORY Keith Howe is a 82 y.o. male who has above history reviewed by me today presents for follow up visit for prostate cancer. S/p 3 cycles of Docetaxel with GCSF supports.  Accompanied by wife today.  Patient reports feeling well.  Denies any lightheaded or severe fatigue.  Denies chest pain or shortness of breath. He was here for repeat blood work and possible chemotherapy.  The Was noted that hemoglobin dropped to 5.7.  He has received 1 unit of PRBC last week No bright red blood in the  stool, or tarry stool.  Stool is dark as he is taking oral iron supplementation. Accompanied by wife.  BP in the clinic is 100/50, heart rate 95.  Patient is on Lopressor, amlodipine, losartan.  He took all his blood pressure in the morning.  Review of Systems  Constitutional:  Positive for fatigue. Negative for appetite change, chills, fever and unexpected weight change.  HENT:   Negative for hearing loss and voice change.   Eyes:  Negative for eye problems and icterus.  Respiratory:  Negative for chest tightness, cough and shortness of breath.   Cardiovascular:  Negative for chest pain and leg swelling.  Gastrointestinal:  Negative for abdominal distention and abdominal pain.  Endocrine: Negative for hot flashes.  Genitourinary:  Negative for difficulty urinating, dysuria and frequency.   Musculoskeletal:  Negative for arthralgias.  Skin:  Negative for itching and rash.  Neurological:  Negative for light-headedness and numbness.  Hematological:  Negative for  adenopathy. Does not bruise/bleed easily.  Psychiatric/Behavioral:  Negative for confusion.     MEDICAL HISTORY:  Past Medical History:  Diagnosis Date   BPH (benign prostatic hyperplasia)    Cancer (HCC)    Chronic kidney disease    STAGE 3   Diabetes mellitus without complication (HCC)    HOH (hard of hearing)    AIDS   Hypertension    Inguinal hernia    Pain    CHRONIC LBP   Prostate cancer [facility])     SURGICAL HISTORY: Past Surgical History:  Procedure Laterality Date   BACK SURGERY     Lumbar   CATARACT EXTRACTION W/PHACO Right 07/10/2017   Procedure: CATARACT EXTRACTION PHACO AND INTRAOCULAR LENS PLACEMENT (IOC);  Surgeon: Galen Manila, MD;  Location: [facility] ORS;  Service: Ophthalmology;  Laterality: Right;  [place] 00:29.2 AP% 16.5 CDE 4.83 Fluid Pack Lot # [id number]   COLONOSCOPY     EYE SURGERY Bilateral    cataract extraction   HERNIA REPAIR     INGUINAL   PROSTATE BIOPSY N/A 04/08/2020   Procedure: PROSTATE BIOPSY Addison Bailey;  Surgeon: Orson Ape, MD;  Location: [facility] ORS;  Service: Urology;  Laterality: N/A;   TOTAL KNEE ARTHROPLASTY Left 03/19/2018   Procedure: TOTAL KNEE ARTHROPLASTY;  Surgeon: Juanell Fairly, MD;  Location: [facility] ORS;  Service: Orthopedics;  Laterality: Left;    SOCIAL HISTORY: Social History   Socioeconomic History   Marital status: Married    Spouse name: Not on file   Number of children: Not on file   Years of education: Not on file   Highest education level: Some college, no degree  Occupational History   Not on file  Tobacco Use   Smoking status: Never   Smokeless tobacco: Never  Vaping Use   Vaping Use: Never used  Substance and Sexual Activity   Alcohol use: No   Drug use: Never   Sexual activity: Not Currently  Other Topics Concern   Not on file  Social History Narrative   Independent at baseline.  Lives at home with his wife   Social Determinants of Health   Financial Resource Strain: Low Risk  (05/12/2021)    Overall Financial Resource Strain (CARDIA)    Difficulty of Paying Living Expenses: Not hard at all  Food Insecurity: No Food Insecurity (07/31/2022)   Hunger Vital Sign    Worried About Running Out of Food in the Last Year: Never true    Ran Out of Food in the Last Year: Never true  Transportation Needs:  No Transportation Needs (07/31/2022)   PRAPARE - Administrator, Civil Service (Medical): No    Lack of Transportation (Non-Medical): No  Physical Activity: Insufficiently Active (05/12/2021)   Exercise Vital Sign    Days of Exercise per Week: 7 days    Minutes of Exercise per Session: 10 min  Stress: No Stress Concern Present (05/12/2021)   [employer] of Occupational Health - Occupational Stress Questionnaire    Feeling of Stress : Not at all  Social Connections: Socially Integrated (05/12/2021)   Social Connection and Isolation Panel [NHANES]    Frequency of Communication with Friends and Family: More than three times a week    Frequency of Social Gatherings with Friends and Family: Once a week    Attends Religious Services: More than 4 times per year    Active Member of [employer] or Organizations: Yes    Attends Engineer, structural: More than 4 times per year    Marital Status: Married  Catering manager Violence: Not At Risk (07/31/2022)   Humiliation, Afraid, Rape, and Kick questionnaire    Fear of Current or Ex-Partner: No    Emotionally Abused: No    Physically Abused: No    Sexually Abused: No    FAMILY HISTORY: Family History  Problem Relation Age of Onset   Diabetes Father    Breast cancer Sister     ALLERGIES:  has No Known Allergies.  MEDICATIONS:  Current Outpatient Medications  Medication Sig Dispense Refill   amLODipine (NORVASC) 5 MG tablet Take 2.5 mg by mouth daily.     aspirin EC 81 MG tablet Take 81 mg by mouth daily.     cholecalciferol (VITAMIN D) 1000 units tablet Take 1,000 Units by mouth daily.     dapagliflozin propanediol  (FARXIGA) 10 MG TABS tablet Take by mouth daily.     dexamethasone (DECADRON) 4 MG tablet Take 2 tabs by mouth daily starting the day before chemo. Then take 2 tabs daily for 2 days starting day after chemo. Take with food. 36 tablet 1   Iron-Vitamin C 65-125 MG TABS Take 1 tablet by mouth daily. 30 tablet 3   loratadine (CLARITIN) 10 MG tablet Take 10 mg by mouth once daily before chemo and at least 2 days after injection.     losartan (COZAAR) 100 MG tablet Take 100 mg by mouth daily.     lovastatin (MEVACOR) 40 MG tablet TAKE 1 TABLET BY MOUTH EVERY DAY 90 tablet 3   metoprolol tartrate (LOPRESSOR) 50 MG tablet TAKE 1 TABLET BY MOUTH EVERY 12 HOURS 180 tablet 2   Misc Natural Products (PROSTATE SUPPORT PO) Take 2 capsules by mouth daily.     NOVOLOG FLEXPEN 100 UNIT/ML FlexPen Inject into the skin. Per sliding scale  0   Omega-3 Fatty Acids (FISH OIL PO) Take 1 capsule by mouth daily.      ondansetron (ZOFRAN) 8 MG tablet Take 1 tablet (8 mg total) by mouth every 8 (eight) hours as needed for nausea or vomiting. 30 tablet 1   prochlorperazine (COMPAZINE) 10 MG tablet Take 1 tablet (10 mg total) by mouth every 6 (six) hours as needed for nausea or vomiting. 30 tablet 1   TRESIBA FLEXTOUCH 200 UNIT/ML SOPN Inject 80 Units into the muscle daily before breakfast.   2   TRULICITY 1.5 MG/0.5ML SOPN Inject into the skin.     vitamin B-12 (CYANOCOBALAMIN) 500 MCG tablet Take 500 mcg by mouth daily.  No current facility-administered medications for this visit.     PHYSICAL EXAMINATION: ECOG PERFORMANCE STATUS: 1 - Symptomatic but completely ambulatory Vitals:   09/20/22 1015  BP: (!) 100/50  Pulse: 95  Resp: 18  Temp: 97.7 F (36.5 C)   There were no vitals filed for this visit.   Physical Exam Constitutional:      General: He is not in acute distress. HENT:     Head: Normocephalic and atraumatic.  Eyes:     General: No scleral icterus. Cardiovascular:     Rate and Rhythm:  Normal rate and regular rhythm.     Heart sounds: Normal heart sounds.  Pulmonary:     Effort: Pulmonary effort is normal. No respiratory distress.     Breath sounds: No wheezing.  Abdominal:     General: Bowel sounds are normal. There is no distension.     Palpations: Abdomen is soft.  Musculoskeletal:        General: No deformity. Normal range of motion.     Cervical back: Normal range of motion and neck supple.  Skin:    General: Skin is warm and dry.     Findings: No erythema or rash.  Neurological:     Mental Status: He is alert and oriented to person, place, and time. Mental status is at baseline.     Cranial Nerves: No cranial nerve deficit.     Coordination: Coordination normal.  Psychiatric:        Mood and Affect: Mood normal.     LABORATORY DATA:  I have reviewed the data as listed    Latest Ref Rng & Units 09/20/2022    8:49 AM 09/13/2022    8:49 AM 09/05/2022    9:28 AM  CBC  WBC 4.0 - 10.5 K/uL 11.2  8.7  13.2   Hemoglobin 13.0 - 17.0 g/dL 5.7  7.5  9.6   Hematocrit 39.0 - 52.0 % 18.5  23.0  29.1   Platelets 150 - 400 K/uL 311  366  143       Latest Ref Rng & Units 09/20/2022    8:49 AM 09/13/2022    8:49 AM 08/31/2022   10:53 AM  CMP  Glucose 70 - 99 mg/dL 098  119  147   BUN 8 - 23 mg/dL 83  28  23   Creatinine 0.61 - 1.24 mg/dL 8.29  5.62  1.30   Sodium 135 - 145 mmol/L 138  138  133   Potassium 3.5 - 5.1 mmol/L 4.4  4.4  4.1   Chloride 98 - 111 mmol/L 106  104  99   CO2 22 - 32 mmol/L 17  27  26    Calcium 8.9 - 10.3 mg/dL 8.6  9.0  9.0   Total Protein 6.5 - 8.1 g/dL 5.7  6.5  6.8   Total Bilirubin 0.3 - 1.2 mg/dL 0.3  0.3  0.4   Alkaline Phos 38 - 126 U/L 36  56  85   AST 15 - 41 U/L 34  22  24   ALT 0 - 44 U/L 16  18  18      RADIOGRAPHIC STUDIES: I have personally reviewed the radiological images as listed and agreed with the findings in the report. CT Abdomen Pelvis Wo Contrast  Result Date: 09/20/2022 CLINICAL DATA:  Symptomatic anemia.  Clinical suspicion for retroperitoneal hemorrhage. EXAM: CT ABDOMEN AND PELVIS WITHOUT CONTRAST TECHNIQUE: Multidetector CT imaging of the abdomen and pelvis was performed following the  standard protocol without IV contrast. RADIATION DOSE REDUCTION: This exam was performed according to the departmental dose-optimization program which includes automated exposure control, adjustment of the mA and/or kV according to patient size and/or use of iterative reconstruction technique. COMPARISON:  05/13/2020 FINDINGS: Lower chest: No acute findings. Hepatobiliary: No mass visualized on this unenhanced exam. Gallbladder is unremarkable. No evidence of biliary ductal dilatation. Pancreas: No mass or inflammatory process visualized on this unenhanced exam. Spleen:  Within normal limits in size. Adrenals/Urinary tract: No evidence of urolithiasis. No suspicious renal masses identified. Moderate to severe bilateral hydroureteronephrosis is seen to the level of the bladder, which is new since previous study. No ureteral calculi or other obstructing etiology visualized. Distended urinary bladder noted with mild diffuse wall thickening. Stomach/Bowel: No evidence of obstruction, inflammatory process, or abnormal fluid collections. Normal appendix visualized. Moderate colonic stool burden noted. Vascular/Lymphatic: No pathologically enlarged lymph nodes identified. No evidence of abdominal aortic aneurysm. Aortic atherosclerotic calcification incidentally noted. Reproductive: Moderately enlarged prostate gland, without significant change. Other:  None.  No evidence of retroperitoneal hemorrhage. Musculoskeletal:  No suspicious bone lesions identified. IMPRESSION: No evidence of retroperitoneal hemorrhage. New moderate to severe bilateral hydroureteronephrosis to the level of the bladder. No ureteral calculi or other obstructing etiology visualized. This is likely due to vesicoureteral reflux given distended urinary bladder. Distended  urinary bladder with mild diffuse wall thickening, presumably due to chronic bladder outlet obstruction. Moderately enlarged prostate gland, without significant change. Moderate colonic stool burden; recommend correlation for symptoms or signs of constipation. Electronically Signed   By: Danae Orleans M.D.   On: 09/20/2022 12:49

## 2022-09-21 ENCOUNTER — Inpatient Hospital Stay
Admission: EM | Admit: 2022-09-21 | Discharge: 2022-09-26 | DRG: 814 | Disposition: A | Discharge status: Home Health | Payer: Medicare HMO | Attending: Internal Medicine | Admitting: Internal Medicine

## 2022-09-21 ENCOUNTER — Inpatient Hospital Stay: Payer: Medicare HMO | Admitting: Oncology

## 2022-09-21 ENCOUNTER — Telehealth: Payer: Self-pay

## 2022-09-21 ENCOUNTER — Other Ambulatory Visit: Payer: Self-pay

## 2022-09-21 ENCOUNTER — Inpatient Hospital Stay: Payer: Medicare HMO

## 2022-09-21 ENCOUNTER — Emergency Department: Payer: Medicare HMO

## 2022-09-21 DIAGNOSIS — I959 Hypotension, unspecified: Secondary | ICD-10-CM | POA: Diagnosis present

## 2022-09-21 DIAGNOSIS — E669 Obesity, unspecified: Secondary | ICD-10-CM | POA: Diagnosis present

## 2022-09-21 DIAGNOSIS — R600 Localized edema: Secondary | ICD-10-CM | POA: Diagnosis present

## 2022-09-21 DIAGNOSIS — D72829 Elevated white blood cell count, unspecified: Secondary | ICD-10-CM | POA: Diagnosis present

## 2022-09-21 DIAGNOSIS — D649 Anemia, unspecified: Secondary | ICD-10-CM

## 2022-09-21 DIAGNOSIS — Z794 Long term (current) use of insulin: Secondary | ICD-10-CM

## 2022-09-21 DIAGNOSIS — Z7189 Other specified counseling: Secondary | ICD-10-CM | POA: Diagnosis not present

## 2022-09-21 DIAGNOSIS — I493 Ventricular premature depolarization: Secondary | ICD-10-CM | POA: Diagnosis present

## 2022-09-21 DIAGNOSIS — N179 Acute kidney failure, unspecified: Secondary | ICD-10-CM | POA: Diagnosis present

## 2022-09-21 DIAGNOSIS — D631 Anemia in chronic kidney disease: Secondary | ICD-10-CM | POA: Diagnosis present

## 2022-09-21 DIAGNOSIS — R310 Gross hematuria: Secondary | ICD-10-CM | POA: Diagnosis not present

## 2022-09-21 DIAGNOSIS — R531 Weakness: Principal | ICD-10-CM

## 2022-09-21 DIAGNOSIS — N183 Chronic kidney disease, stage 3 unspecified: Secondary | ICD-10-CM | POA: Diagnosis not present

## 2022-09-21 DIAGNOSIS — G9341 Metabolic encephalopathy: Secondary | ICD-10-CM | POA: Diagnosis present

## 2022-09-21 DIAGNOSIS — N400 Enlarged prostate without lower urinary tract symptoms: Secondary | ICD-10-CM | POA: Diagnosis present

## 2022-09-21 DIAGNOSIS — E1169 Type 2 diabetes mellitus with other specified complication: Secondary | ICD-10-CM | POA: Diagnosis not present

## 2022-09-21 DIAGNOSIS — D7589 Other specified diseases of blood and blood-forming organs: Principal | ICD-10-CM | POA: Diagnosis present

## 2022-09-21 DIAGNOSIS — N189 Chronic kidney disease, unspecified: Secondary | ICD-10-CM | POA: Diagnosis present

## 2022-09-21 DIAGNOSIS — Z833 Family history of diabetes mellitus: Secondary | ICD-10-CM

## 2022-09-21 DIAGNOSIS — C61 Malignant neoplasm of prostate: Secondary | ICD-10-CM | POA: Diagnosis present

## 2022-09-21 DIAGNOSIS — Z7982 Long term (current) use of aspirin: Secondary | ICD-10-CM | POA: Diagnosis not present

## 2022-09-21 DIAGNOSIS — Z79899 Other long term (current) drug therapy: Secondary | ICD-10-CM

## 2022-09-21 DIAGNOSIS — E1122 Type 2 diabetes mellitus with diabetic chronic kidney disease: Secondary | ICD-10-CM | POA: Diagnosis present

## 2022-09-21 DIAGNOSIS — Z96652 Presence of left artificial knee joint: Secondary | ICD-10-CM | POA: Diagnosis present

## 2022-09-21 DIAGNOSIS — E869 Volume depletion, unspecified: Secondary | ICD-10-CM | POA: Diagnosis present

## 2022-09-21 DIAGNOSIS — I4891 Unspecified atrial fibrillation: Secondary | ICD-10-CM | POA: Diagnosis not present

## 2022-09-21 DIAGNOSIS — N1832 Chronic kidney disease, stage 3b: Secondary | ICD-10-CM | POA: Diagnosis present

## 2022-09-21 DIAGNOSIS — R55 Syncope and collapse: Secondary | ICD-10-CM | POA: Diagnosis not present

## 2022-09-21 DIAGNOSIS — R339 Retention of urine, unspecified: Secondary | ICD-10-CM

## 2022-09-21 DIAGNOSIS — N320 Bladder-neck obstruction: Secondary | ICD-10-CM | POA: Diagnosis present

## 2022-09-21 DIAGNOSIS — Z9841 Cataract extraction status, right eye: Secondary | ICD-10-CM

## 2022-09-21 DIAGNOSIS — E11649 Type 2 diabetes mellitus with hypoglycemia without coma: Secondary | ICD-10-CM | POA: Diagnosis present

## 2022-09-21 DIAGNOSIS — Z6830 Body mass index (BMI) 30.0-30.9, adult: Secondary | ICD-10-CM

## 2022-09-21 DIAGNOSIS — Z515 Encounter for palliative care: Secondary | ICD-10-CM | POA: Diagnosis not present

## 2022-09-21 DIAGNOSIS — I129 Hypertensive chronic kidney disease with stage 1 through stage 4 chronic kidney disease, or unspecified chronic kidney disease: Secondary | ICD-10-CM | POA: Diagnosis present

## 2022-09-21 DIAGNOSIS — E1165 Type 2 diabetes mellitus with hyperglycemia: Secondary | ICD-10-CM | POA: Diagnosis present

## 2022-09-21 DIAGNOSIS — I10 Essential (primary) hypertension: Secondary | ICD-10-CM | POA: Diagnosis present

## 2022-09-21 DIAGNOSIS — N1330 Unspecified hydronephrosis: Secondary | ICD-10-CM | POA: Diagnosis present

## 2022-09-21 DIAGNOSIS — C7951 Secondary malignant neoplasm of bone: Secondary | ICD-10-CM | POA: Diagnosis present

## 2022-09-21 DIAGNOSIS — E1129 Type 2 diabetes mellitus with other diabetic kidney complication: Secondary | ICD-10-CM | POA: Diagnosis present

## 2022-09-21 DIAGNOSIS — Z7985 Long-term (current) use of injectable non-insulin antidiabetic drugs: Secondary | ICD-10-CM

## 2022-09-21 DIAGNOSIS — T451X5A Adverse effect of antineoplastic and immunosuppressive drugs, initial encounter: Secondary | ICD-10-CM | POA: Diagnosis present

## 2022-09-21 DIAGNOSIS — E861 Hypovolemia: Secondary | ICD-10-CM | POA: Diagnosis not present

## 2022-09-21 DIAGNOSIS — G8929 Other chronic pain: Secondary | ICD-10-CM | POA: Diagnosis present

## 2022-09-21 DIAGNOSIS — Z961 Presence of intraocular lens: Secondary | ICD-10-CM | POA: Diagnosis present

## 2022-09-21 DIAGNOSIS — N134 Hydroureter: Secondary | ICD-10-CM

## 2022-09-21 LAB — BPAM RBC: Blood Product Expiration Date: 202406092359

## 2022-09-21 LAB — CBC
HCT: 12.8 % — CL (ref 39.0–52.0)
HCT: 16 % — ABNORMAL LOW (ref 39.0–52.0)
HCT: 26.7 % — ABNORMAL LOW (ref 39.0–52.0)
Hemoglobin: 3.9 g/dL — CL (ref 13.0–17.0)
Hemoglobin: 5.2 g/dL — ABNORMAL LOW (ref 13.0–17.0)
Hemoglobin: 8.8 g/dL — ABNORMAL LOW (ref 13.0–17.0)
MCH: 29.8 pg (ref 26.0–34.0)
MCH: 28.9 pg (ref 26.0–34.0)
MCH: 29.2 pg (ref 26.0–34.0)
MCHC: 30.5 g/dL (ref 30.0–36.0)
MCHC: 32.5 g/dL (ref 30.0–36.0)
MCHC: 33 g/dL (ref 30.0–36.0)
MCV: 97.7 fL (ref 80.0–100.0)
MCV: 88.9 fL (ref 80.0–100.0)
MCV: 88.7 fL (ref 80.0–100.0)
Platelets: 227 10*3/uL (ref 150–400)
Platelets: 178 10*3/uL (ref 150–400)
Platelets: 146 10*3/uL — ABNORMAL LOW (ref 150–400)
RBC: 1.31 MIL/uL — ABNORMAL LOW (ref 4.22–5.81)
RBC: 1.8 MIL/uL — ABNORMAL LOW (ref 4.22–5.81)
RBC: 3.01 MIL/uL — ABNORMAL LOW (ref 4.22–5.81)
RDW: 17.4 % — ABNORMAL HIGH (ref 11.5–15.5)
RDW: 15.6 % — ABNORMAL HIGH (ref 11.5–15.5)
RDW: 15.1 % (ref 11.5–15.5)
WBC: 12.3 10*3/uL — ABNORMAL HIGH (ref 4.0–10.5)
WBC: 11.9 10*3/uL — ABNORMAL HIGH (ref 4.0–10.5)
WBC: 14.4 10*3/uL — ABNORMAL HIGH (ref 4.0–10.5)
nRBC: 0.5 % — ABNORMAL HIGH (ref 0.0–0.2)
nRBC: 0.8 % — ABNORMAL HIGH (ref 0.0–0.2)
nRBC: 0.8 % — ABNORMAL HIGH (ref 0.0–0.2)

## 2022-09-21 LAB — TYPE AND SCREEN
ABO/RH(D): B POS
Antibody Screen: NEGATIVE
Antibody Screen: NEGATIVE

## 2022-09-21 LAB — URINALYSIS, COMPLETE (UACMP) WITH MICROSCOPIC
RBC / HPF: >50 RBC/hpf (ref 0–5)
Specific Gravity, Urine: 1.016 (ref 1.005–1.030)

## 2022-09-21 LAB — RETIC PANEL
Immature Retic Fract: 39.2 % — ABNORMAL HIGH (ref 2.3–15.9)
RBC.: 1.81 MIL/uL — ABNORMAL LOW (ref 4.22–5.81)
Retic Count, Absolute: 124 10*3/uL (ref 19.0–186.0)
Retic Ct Pct: 6.9 % — ABNORMAL HIGH (ref 0.4–3.1)
Reticulocyte Hemoglobin: 30 pg (ref >27.9)

## 2022-09-21 LAB — COMPREHENSIVE METABOLIC PANEL
ALT: 27 U/L (ref 0–44)
AST: 37 U/L (ref 15–41)
Albumin: 2.6 g/dL — ABNORMAL LOW (ref 3.5–5.0)
Alkaline Phosphatase: 36 U/L — ABNORMAL LOW (ref 38–126)
Anion gap: 15 (ref 5–15)
BUN: 103 mg/dL — ABNORMAL HIGH (ref 8–23)
CO2: 14 mmol/L — ABNORMAL LOW (ref 22–32)
Calcium: 8.3 mg/dL — ABNORMAL LOW (ref 8.9–10.3)
Chloride: 106 mmol/L (ref 98–111)
Creatinine, Ser: 2.12 mg/dL — ABNORMAL HIGH (ref 0.61–1.24)
GFR, Estimated: 31 mL/min — ABNORMAL LOW (ref >60)
Glucose, Bld: 285 mg/dL — ABNORMAL HIGH (ref 70–99)
Potassium: 4.8 mmol/L (ref 3.5–5.1)
Sodium: 135 mmol/L (ref 135–145)
Total Bilirubin: 0.6 mg/dL (ref 0.3–1.2)
Total Protein: 4.8 g/dL — ABNORMAL LOW (ref 6.5–8.1)

## 2022-09-21 LAB — CBG MONITORING, ED: Glucose-Capillary: 253 mg/dL — ABNORMAL HIGH (ref 70–99)

## 2022-09-21 LAB — BLOOD GAS, VENOUS
Acid-base deficit: 6.6 mmol/L — ABNORMAL HIGH (ref 0.0–2.0)
Bicarbonate: 19.1 mmol/L — ABNORMAL LOW (ref 20.0–28.0)
O2 Saturation: 46.9 %
Patient temperature: 37
pCO2, Ven: 38 mmHg — ABNORMAL LOW (ref 44–60)
pH, Ven: 7.31 (ref 7.25–7.43)
pO2, Ven: 33 mmHg (ref 32–45)

## 2022-09-21 LAB — APTT: aPTT: 28 seconds (ref 24–36)

## 2022-09-21 LAB — PROTIME-INR
INR: 1.4 — ABNORMAL HIGH (ref 0.8–1.2)
Prothrombin Time: 17.8 seconds — ABNORMAL HIGH (ref 11.4–15.2)

## 2022-09-21 LAB — PREPARE RBC (CROSSMATCH)

## 2022-09-21 LAB — GLUCOSE, CAPILLARY
Glucose-Capillary: 269 mg/dL — ABNORMAL HIGH (ref 70–99)
Glucose-Capillary: 304 mg/dL — ABNORMAL HIGH (ref 70–99)
Glucose-Capillary: 312 mg/dL — ABNORMAL HIGH (ref 70–99)
Glucose-Capillary: 336 mg/dL — ABNORMAL HIGH (ref 70–99)
Glucose-Capillary: 300 mg/dL — ABNORMAL HIGH (ref 70–99)

## 2022-09-21 LAB — BRAIN NATRIURETIC PEPTIDE: B Natriuretic Peptide: 80.3 pg/mL (ref 0.0–100.0)

## 2022-09-21 LAB — MAGNESIUM: Magnesium: 2.2 mg/dL (ref 1.7–2.4)

## 2022-09-21 LAB — PHOSPHORUS: Phosphorus: 4.1 mg/dL (ref 2.5–4.6)

## 2022-09-21 LAB — MRSA NEXT GEN BY PCR, NASAL: MRSA by PCR Next Gen: NOT DETECTED

## 2022-09-21 MED ORDER — SODIUM CHLORIDE 0.9 % IV BOLUS
500.0000 mL | Freq: Once | INTRAVENOUS | Status: AC
  Administered 2022-09-21: 500 mL via INTRAVENOUS

## 2022-09-21 MED ORDER — PRAVASTATIN SODIUM 20 MG PO TABS
40.0000 mg | ORAL_TABLET | Freq: Every day | ORAL | Status: DC
  Administered 2022-09-22 – 2022-09-25 (×4): 40 mg via ORAL
  Filled 2022-09-21 (×4): qty 2

## 2022-09-21 MED ORDER — INSULIN ASPART 100 UNIT/ML IJ SOLN
0.0000 [IU] | Freq: Three times a day (TID) | INTRAMUSCULAR | Status: DC
  Administered 2022-09-21 (×2): 5 [IU] via SUBCUTANEOUS
  Filled 2022-09-21 (×2): qty 1

## 2022-09-21 MED ORDER — ACETAMINOPHEN 325 MG PO TABS: 650.0000 mg | ORAL_TABLET | Freq: Four times a day (QID) | ORAL | Status: DC | PRN

## 2022-09-21 MED ORDER — ONDANSETRON HCL 4 MG/2ML IJ SOLN
4.0000 mg | Freq: Three times a day (TID) | INTRAMUSCULAR | Status: DC | PRN
  Administered 2022-09-23: 4 mg via INTRAVENOUS
  Filled 2022-09-21: qty 2

## 2022-09-21 MED ORDER — NOREPINEPHRINE 4 MG/250ML-% IV SOLN
0.0000 ug/min | INTRAVENOUS | Status: DC
  Administered 2022-09-21: 2 ug/min via INTRAVENOUS

## 2022-09-21 MED ORDER — SODIUM CHLORIDE 0.9% IV SOLUTION: Freq: Once | INTRAVENOUS | Status: DC

## 2022-09-21 MED ORDER — METOPROLOL TARTRATE 5 MG/5ML IV SOLN
5.0000 mg | INTRAVENOUS | Status: DC | PRN
  Administered 2022-09-22: 5 mg via INTRAVENOUS
  Filled 2022-09-21: qty 5

## 2022-09-21 MED ORDER — SODIUM CHLORIDE 0.9 % IV SOLN: 10.0000 mL/h | Freq: Once | INTRAVENOUS | Status: DC

## 2022-09-21 MED ORDER — SODIUM CHLORIDE 0.9 % IV SOLN
200.0000 mg | Freq: Once | INTRAVENOUS | Status: AC
  Administered 2022-09-22: 200 mg via INTRAVENOUS
  Filled 2022-09-21: qty 200

## 2022-09-21 MED ORDER — MIDODRINE HCL 5 MG PO TABS
10.0000 mg | ORAL_TABLET | Freq: Three times a day (TID) | ORAL | Status: DC
  Administered 2022-09-22 – 2022-09-23 (×5): 10 mg via ORAL
  Filled 2022-09-21 (×5): qty 2

## 2022-09-21 MED ORDER — VITAMIN B-12 1000 MCG PO TABS
500.0000 ug | ORAL_TABLET | Freq: Every day | ORAL | Status: DC
  Administered 2022-09-21 – 2022-09-26 (×6): 500 ug via ORAL
  Filled 2022-09-21 (×6): qty 1

## 2022-09-21 MED ORDER — SODIUM CHLORIDE 0.9 % IV SOLN: INTRAVENOUS | Status: DC

## 2022-09-21 MED ORDER — DEXTROSE 50 % IV SOLN: 0.0000 mL | INTRAVENOUS | Status: DC | PRN

## 2022-09-21 MED ORDER — OMEGA-3-ACID ETHYL ESTERS 1 G PO CAPS
1.0000 g | ORAL_CAPSULE | Freq: Every day | ORAL | Status: DC
  Administered 2022-09-21 – 2022-09-26 (×6): 1 g via ORAL
  Filled 2022-09-21 (×6): qty 1

## 2022-09-21 MED ORDER — FE FUM-VIT C-VIT B12-FA 460-60-0.01-1 MG PO CAPS
1.0000 | ORAL_CAPSULE | Freq: Every day | ORAL | Status: DC
  Administered 2022-09-21: 1 via ORAL
  Filled 2022-09-21: qty 1

## 2022-09-21 MED ORDER — VITAMIN D 25 MCG (1000 UNIT) PO TABS
1000.0000 [IU] | ORAL_TABLET | Freq: Every day | ORAL | Status: DC
  Administered 2022-09-21 – 2022-09-26 (×6): 1000 [IU] via ORAL
  Filled 2022-09-21 (×6): qty 1

## 2022-09-21 MED ORDER — INSULIN ASPART 100 UNIT/ML IJ SOLN: 0.0000 [IU] | Freq: Every day | INTRAMUSCULAR | Status: DC

## 2022-09-21 MED ORDER — INSULIN ASPART 100 UNIT/ML IJ SOLN
0.0000 [IU] | INTRAMUSCULAR | Status: DC
  Administered 2022-09-21: 7 [IU] via SUBCUTANEOUS
  Administered 2022-09-21: 5 [IU] via SUBCUTANEOUS
  Filled 2022-09-21 (×2): qty 1

## 2022-09-21 MED ORDER — CHLORHEXIDINE GLUCONATE CLOTH 2 % EX PADS
6.0000 | MEDICATED_PAD | Freq: Every day | CUTANEOUS | Status: DC
  Administered 2022-09-22 – 2022-09-26 (×5): 6 via TOPICAL

## 2022-09-21 MED ORDER — INSULIN REGULAR(HUMAN) IN NACL 100-0.9 UT/100ML-% IV SOLN
INTRAVENOUS | Status: DC
  Administered 2022-09-22: 9 [IU]/h via INTRAVENOUS
  Filled 2022-09-21: qty 100

## 2022-09-21 MED ORDER — INSULIN ASPART 100 UNIT/ML IJ SOLN
8.0000 [IU] | Freq: Once | INTRAMUSCULAR | Status: AC
  Administered 2022-09-21: 8 [IU] via SUBCUTANEOUS
  Filled 2022-09-21: qty 1

## 2022-09-21 MED ORDER — PANTOPRAZOLE SODIUM 40 MG IV SOLR
40.0000 mg | Freq: Two times a day (BID) | INTRAVENOUS | Status: DC
  Administered 2022-09-21 – 2022-09-26 (×10): 40 mg via INTRAVENOUS
  Filled 2022-09-21 (×11): qty 10

## 2022-09-21 MED ORDER — NOREPINEPHRINE 4 MG/250ML-% IV SOLN
INTRAVENOUS | Status: AC
  Administered 2022-09-21: 4 mg
  Filled 2022-09-21: qty 250

## 2022-09-21 MED ORDER — INSULIN GLARGINE-YFGN 100 UNIT/ML ~~LOC~~ SOLN
60.0000 [IU] | Freq: Every day | SUBCUTANEOUS | Status: DC
  Administered 2022-09-21: 60 [IU] via SUBCUTANEOUS
  Filled 2022-09-21: qty 0.6

## 2022-09-21 NOTE — Consult Note (Signed)
GI Inpatient Consult Note  Reason for Consult: Acute on chronic anemia, symptomatic anemia, prostate cancer on chemotherapy, CKD 3B   Attending Requesting Consult: Dr. Clyde Lundborg  History of Present Illness: Keith Howe is a 82 y.o. male seen for evaluation of acute on chronic anemia  at the request of Dr. Clyde Lundborg. Patient has a history of prostate cancer on chemotherapy, hypertension, diabetes mellitus, obesity, BPH, chronic kidney disease 3B, chronic back pain, chronic anemia who was admitted from the infusion center today. Patient has received total 3 cycles of docetaxel. Has developed recurrent severe anemia and further chemotherapy on hold.   On 09/05/22 he had a hemoglobin 9.6.  On 09/13/2022, hemoglobin was 7.5 BUN 28 creatinine 1.46.  He received 1 unit PRBC. Yesterday, hemoglobin was 5.7 with hematocrit 18.5, platelet 311, BUN 83,/Cr 1.94, Ferritin 67, iron sat 29.   He was sent to the infusion center today to receive  2 PRBC. While at the infusion center, he became lethargic and largely unresponsive. His Hgb dropped to 3.9.  He was admitted through the ED  to ICU today and plans to transfuse 4 u PRBC. On arrival, he was weak, answering questions and follow basic commands. Patient denies any obvious GI blood loss.  Vital signs blood pressure 115/49, pulse 110, respirations 23, 98.0, SpO2 97%.  Glucose 253.  A CT of the abdomen pelvis obtained was negative for internal hemorrhage. Moderate colonic stool burden, new hydronephrosis.CT head without acute intracranial process.  GI is consulted for evaluation and management of possible GI blood loss anemia.   Today, history provided by his wife at bedside, the patient, and chart review.  The patient has been fairly active physically, mowing the lawn last week.  He has been walking and doing exercises provided by physical therapy.  Yesterday, he had onset of slight dizziness, weakness prior to coming to the infusion center. No CP/SOB. He has been eating  well, with normal appetite diet and weight.  He does have intermittent episodes of nausea treated with 1 antiemetic pill which resolves the nausea well for the day. No vomiting.  He does not take a PPI.  He has noted no heartburn, reflux, dysphagia. Reports today a slight upper abdominal  discomfort now. He reports bowel habits have a tendency to have mild constipation and he takes occasional Colace. Last bowel movement was yesterday formed.  He has noted no blood or melena.  He was seen by our team 08/01/2022 for evaluation of anemia. Impression then and now is that iron studies are not supportive of GI bleeding and suspect anemia is from chemotherapy from bone marrow suppression as well as anemia from chronic kidney disease.    Colonoscopy: Reports x2-3 Many years ago he reports done at [facility].  Records not found.  EGD: None   Non-smoker. Does not drink alcohol. No NSAIDs Denies any family hx colon polyps or colon cancer.  Past Medical History:  Past Medical History:  Diagnosis Date   BPH (benign prostatic hyperplasia)    Cancer (HCC)    Chronic kidney disease    STAGE 3   Diabetes mellitus without complication (HCC)    HOH (hard of hearing)    AIDS   Hypertension    Inguinal hernia    Pain    CHRONIC LBP   Prostate cancer [facility])     Problem List: Patient Active Problem List   Diagnosis Date Noted   Symptomatic anemia 09/21/2022   Chronic kidney disease, stage 3b (HCC)  09/21/2022   Leukocytosis 09/21/2022   Bilateral hydronephrosis 09/20/2022   Chemotherapy-induced neuropathy (HCC) 09/13/2022   Weight loss 08/09/2022   Syncope 07/31/2022   SIRS (systemic inflammatory response syndrome) (HCC) 07/31/2022   Obesity (BMI 30-39.9) 07/31/2022   Type II diabetes mellitus with renal manifestations (HCC) 07/31/2022   Myocardial injury 07/31/2022   Bilateral leg edema 07/31/2022   Normocytic anemia 07/31/2022   Chemotherapy induced neutropenia (HCC) 07/05/2022   Encounter for  antineoplastic chemotherapy 05/26/2022   Prostate cancer (HCC) 07/13/2021   Goals of care, counseling/discussion 07/13/2021   Injury of quadriceps muscle 08/26/2020   CKD (chronic kidney disease) stage 3, GFR 30-59 ml/min (HCC) 05/27/2020   Annual physical exam 02/18/2020   Need for influenza vaccination 02/18/2020   Essential hypertension 12/15/2019   Class 1 obesity with serious comorbidity and body mass index (BMI) of 31.0 to 31.9 in adult 10/13/2019   Type 2 diabetes mellitus without complication, with long-term current use of insulin (HCC) 10/13/2019   S/P TKR (total knee replacement) using cement, left 03/19/2018    Past Surgical History: Past Surgical History:  Procedure Laterality Date   BACK SURGERY     Lumbar   CATARACT EXTRACTION W/PHACO Right 07/10/2017   Procedure: CATARACT EXTRACTION PHACO AND INTRAOCULAR LENS PLACEMENT (IOC);  Surgeon: Galen Manila, MD;  Location: [facility] ORS;  Service: Ophthalmology;  Laterality: Right;  [place] 00:29.2 AP% 16.5 CDE 4.83 Fluid Pack Lot # [id number]   COLONOSCOPY     EYE SURGERY Bilateral    cataract extraction   HERNIA REPAIR     INGUINAL   PROSTATE BIOPSY N/A 04/08/2020   Procedure: PROSTATE BIOPSY Addison Bailey;  Surgeon: Orson Ape, MD;  Location: [facility] ORS;  Service: Urology;  Laterality: N/A;   TOTAL KNEE ARTHROPLASTY Left 03/19/2018   Procedure: TOTAL KNEE ARTHROPLASTY;  Surgeon: Juanell Fairly, MD;  Location: [facility] ORS;  Service: Orthopedics;  Laterality: Left;    Allergies: No Known Allergies  Home Medications: Medications Prior to Admission  Medication Sig Dispense Refill Last Dose   amLODipine (NORVASC) 5 MG tablet Take 5 mg by mouth daily.   Past Week   aspirin EC 81 MG tablet Take 81 mg by mouth daily.   09/21/2022   cholecalciferol (VITAMIN D) 1000 units tablet Take 1,000 Units by mouth daily.   09/21/2022   dapagliflozin propanediol (FARXIGA) 10 MG TABS tablet Take 10 mg by mouth daily.   09/21/2022   dexamethasone  (DECADRON) 4 MG tablet Take 2 tabs by mouth daily starting the day before chemo. Then take 2 tabs daily for 2 days starting day after chemo. Take with food. 36 tablet 1 09/19/2022   Iron-Vitamin C 65-125 MG TABS Take 1 tablet by mouth daily. 30 tablet 3 09/21/2022   loratadine (CLARITIN) 10 MG tablet Take 10 mg by mouth once daily before chemo and at least 2 days after injection.   Past Week   losartan (COZAAR) 100 MG tablet Take 100 mg by mouth daily.   Past Week   lovastatin (MEVACOR) 40 MG tablet TAKE 1 TABLET BY MOUTH EVERY DAY (Patient taking differently: Take 40 mg by mouth at bedtime.) 90 tablet 3 09/20/2022   metoprolol tartrate (LOPRESSOR) 50 MG tablet TAKE 1 TABLET BY MOUTH EVERY 12 HOURS (Patient taking differently: Take 50 mg by mouth 2 (two) times daily.) 180 tablet 2 Past Week   Misc Natural Products (PROSTATE SUPPORT PO) Take 2 capsules by mouth daily.   09/20/2022   NOVOLOG FLEXPEN 100 UNIT/ML FlexPen  Inject into the skin. Per sliding scale  0 09/21/2022   Omega-3 Fatty Acids (FISH OIL PO) Take 1 capsule by mouth daily.    09/20/2022   ondansetron (ZOFRAN) 8 MG tablet Take 1 tablet (8 mg total) by mouth every 8 (eight) hours as needed for nausea or vomiting. 30 tablet 1 unknown   prochlorperazine (COMPAZINE) 10 MG tablet Take 1 tablet (10 mg total) by mouth every 6 (six) hours as needed for nausea or vomiting. 30 tablet 1 unknown   TRESIBA FLEXTOUCH 200 UNIT/ML SOPN Inject 80 Units into the muscle daily before breakfast.   2 09/20/2022   TRULICITY 1.5 MG/0.5ML SOPN Inject into the skin.   09/20/2022   vitamin B-12 (CYANOCOBALAMIN) 500 MCG tablet Take 500 mcg by mouth daily.   09/20/2022   Home medication reconciliation was completed with the patient.   Scheduled Inpatient Medications:    Chlorhexidine Gluconate Cloth  6 each Topical Daily   cholecalciferol  1,000 Units Oral Daily   cyanocobalamin  500 mcg Oral Daily   Fe Fum-Vit C-Vit B12-FA  1 capsule Oral Daily   insulin aspart  0-5  Units Subcutaneous QHS   insulin aspart  0-9 Units Subcutaneous TID WC   insulin glargine-yfgn  60 Units Subcutaneous Daily   omega-3 acid ethyl esters  1 g Oral Daily   pravastatin  40 mg Oral q1800    Continuous Inpatient Infusions:    sodium chloride     sodium chloride     sodium chloride 75 mL/hr at 09/21/22 1352    PRN Inpatient Medications:  acetaminophen, ondansetron (ZOFRAN) IV  Family History: family history includes Breast cancer in his sister; Diabetes in his father.  The patient's family history is negative for inflammatory bowel disorders, GI malignancy, or solid organ transplantation.  Social History:   reports that he has never smoked. He has never used smokeless tobacco. He reports that he does not drink alcohol and does not use drugs. The patient denies ETOH, tobacco, or drug use.   Review of Systems: Constitutional: Weight is stable.  Eyes: No changes in vision. ENT: No oral lesions, sore throat.  GI: see HPI.  Heme/Lymph: No easy bruising.  CV: No chest pain.  GU: No hematuria.  Integumentary: No rashes.  Neuro: No headaches.  Psych: No depression/anxiety.  Endocrine: No heat/cold intolerance.  Allergic/Immunologic: No urticaria.  Resp: No cough, SOB.  Musculoskeletal: No joint swelling.    Physical Examination: BP (!) 99/47   Pulse (!) 126   Temp 98 F (36.7 C)   Resp 19   Ht 6\' 1"  (1.854 m)   Wt 105 kg   SpO2 97%   BMI 30.54 kg/m  Gen: NAD,  lethargic, eyes closed, answers questions with minimal responses. Speech clear, follows commands.  HEENT:  EOMI Neck: supple, no JVD or thyromegaly Chest: CTA bilaterally, no wheezes, crackles, or other adventitious sounds CV: RRR, hyperdynamic, no m/g/c/r Abd: soft, NT, ND, +BS in all four quadrants; no HSM, guarding, rigidity, or rebound tenderness Ext: positive bilat edema Skin: no rash or lesions noted Lymph: no LAD  Data: Lab Results  Component Value Date   WBC 12.3 (H) 09/21/2022   HGB  3.9 (LL) 09/21/2022   HCT 12.8 (LL) 09/21/2022   MCV 97.7 09/21/2022   PLT 227 09/21/2022   Recent Labs  Lab 09/20/22 0849 09/21/22 0931  HGB 5.7* 3.9*   Lab Results  Component Value Date   NA 135 09/21/2022   K 4.8 09/21/2022  CL 106 09/21/2022   CO2 14 (L) 09/21/2022   BUN 103 (H) 09/21/2022   CREATININE 2.12 (H) 09/21/2022   Lab Results  Component Value Date   ALT 27 09/21/2022   AST 37 09/21/2022   ALKPHOS 36 (L) 09/21/2022   BILITOT 0.6 09/21/2022   No results for input(s): "APTT", "INR", "PTT" in the last 168 hours.  Narrative & Impression  CLINICAL DATA:  Symptomatic anemia. Clinical suspicion for retroperitoneal hemorrhage.   EXAM: CT ABDOMEN AND PELVIS WITHOUT CONTRAST   TECHNIQUE: Multidetector CT imaging of the abdomen and pelvis was performed following the standard protocol without IV contrast.   RADIATION DOSE REDUCTION: This exam was performed according to the departmental dose-optimization program which includes automated exposure control, adjustment of the mA and/or kV according to patient size and/or use of iterative reconstruction technique.   COMPARISON:  05/13/2020   FINDINGS: Lower chest: No acute findings.   Hepatobiliary: No mass visualized on this unenhanced exam. Gallbladder is unremarkable. No evidence of biliary ductal dilatation.   Pancreas: No mass or inflammatory process visualized on this unenhanced exam.   Spleen:  Within normal limits in size.   Adrenals/Urinary tract: No evidence of urolithiasis. No suspicious renal masses identified. Moderate to severe bilateral hydroureteronephrosis is seen to the level of the bladder, which is new since previous study. No ureteral calculi or other obstructing etiology visualized. Distended urinary bladder noted with mild diffuse wall thickening.   Stomach/Bowel: No evidence of obstruction, inflammatory process, or abnormal fluid collections. Normal appendix visualized.  Moderate colonic stool burden noted.   Vascular/Lymphatic: No pathologically enlarged lymph nodes identified. No evidence of abdominal aortic aneurysm. Aortic atherosclerotic calcification incidentally noted.   Reproductive: Moderately enlarged prostate gland, without significant change.   Other:  None.  No evidence of retroperitoneal hemorrhage.   Musculoskeletal:  No suspicious bone lesions identified.   IMPRESSION: No evidence of retroperitoneal hemorrhage.   New moderate to severe bilateral hydroureteronephrosis to the level of the bladder. No ureteral calculi or other obstructing etiology visualized. This is likely due to vesicoureteral reflux given distended urinary bladder.   Distended urinary bladder with mild diffuse wall thickening, presumably due to chronic bladder outlet obstruction.   Moderately enlarged prostate gland, without significant change.   Moderate colonic stool burden; recommend correlation for symptoms or signs of constipation.     Electronically Signed   By: Danae Orleans M.D.   On: 09/20/2022 12:49   Assessment/Plan: Mr. Bufkin is a 81 y.o. male seen for evaluation of acute on chronic anemia  at the request of Dr. Clyde Lundborg. Patient has a history of prostate cancer on active chemotherapy, hypertension, diabetes mellitus, obesity, BPH, chronic kidney disease 3B, chronic back pain, chronic anemia who was admitted from the infusion center today.   Severe acute on chronic normocytic anemia: Symptomatic anemia - - History of normocytic anemia and was placed on oral iron therapy a few weeks ago.   - Acute drop in Hgb following chemotherapy likely  from bone marrow suppression as well as anemia from chronic kidney disease.  - He has seen no overt signs of GIB. He has noted no obvious GI blood loss. - He became lethargic and largely unresponsive. His Hgb was 3.9. BUN is climbing. - Plans to transfuse 4 u PRBC today. Additional chemotherapy on hold per Oncology  recs.  Nausea without vomiting Mild epigastric pain - He has mild, intermittent nausea- no vomiting or HB, reflux, dysphagia. Slight epigastric  discomfort today- non tender on  exam. CT non revealing. No NSAID. No PPI.  BUN is climbing and is uremic which is likely contributing to nausea.    Mild constipation CT a/p shows constipation Colace use at home sporadically- reports a good BM yesterday. Denies any lower GI complaints    Reports remote csy at [facility] - records not found.  Prostate cancer  Oncology noted he has received total 3 cycles of docetaxel. He has developed recurrent severe anemia and further chemotherapy on hold.   CKD/AKI Diabetes mellitus HTN Chronic kidney disease is worsening, uremic. Hydronephrosis, leg edema, lethargy Urology consult pending   Recommendations: Consider EGD after transfusions and stabilization.  Diet: clear liquids now Monitor H&H. Transfusion and resuscitation as per primary team Avoid frequent lab draws to prevent lab induced anemia Supportive care as per primary team Maintain two sites IV access Avoid nsaids Monitor for GIB.  Thank you for the consult. Please call with questions or concerns.Case was discussed with Dr. Timothy Lasso.  Amedeo Kinsman, ANP [facility] [phone]

## 2022-09-21 NOTE — ED Provider Notes (Signed)
[facility] Provider Note    Event Date/Time   First MD Initiated Contact with Patient 09/21/22 [phone]     (approximate)  History   Chief Complaint: Weakness and Loss of Consciousness  HPI  Keith Howe is a 82 y.o. male with a past medical history of CKD, diabetes, hypertension, presents to the emergency department urgently from the infusion center.  According to nurse report patient was being seen in the infusion center for his scheduled 2 units of blood transfusion for anemia.  However while getting ready for the appointment the patient became lethargic and largely unresponsive.  Nurse states that patient's eyes were open and he was blinking at times but would not answer questions or follow commands.  This lasted for a minute or so and then the patient became responsive once again.  They immediately wheeled the patient over to the emergency department for evaluation.  Upon arrival patient is weak appearing but he is answering questions and will follow very basic commands.  Patient's hemoglobin reviewed and his chart from yesterday is 5.7.  Fingerstick blood glucose 253.  Physical Exam   Triage Vital Signs: ED Triage Vitals  Enc Vitals Group     BP 09/21/22 0927 (!) 115/49     Pulse Rate 09/21/22 0927 (!) 110     Resp 09/21/22 0927 (!) 23     Temp 09/21/22 0927 98 F (36.7 C)     Temp src --      SpO2 09/21/22 0927 97 %     Weight --      Height --      Head Circumference --      Peak Flow --      Pain Score 09/21/22 0925 0     Pain Loc --      Pain Edu? --      Excl. in GC? --     Most recent vital signs: Vitals:   09/21/22 0927  BP: (!) 115/49  Pulse: (!) 110  Resp: (!) 23  Temp: 98 F (36.7 C)  SpO2: 97%    General: Weak appearing.  No distress.  Answering questions. CV:  Good peripheral perfusion.  Regular rate and rhythm  Resp:  Normal effort.  Equal breath sounds bilaterally.  Abd:  No distention.  Soft, nontender.  No rebound or  guarding.  ED Results / Procedures / Treatments   EKG  EKG reviewed and interpreted by myself shows sinus tachycardia 108 bpm with a narrow QRS, normal axis, normal intervals, nonspecific ST changes without ST elevation.  MEDICATIONS ORDERED IN ED: Medications - No data to display   IMPRESSION / MDM / ASSESSMENT AND PLAN / ED COURSE  I reviewed the triage vital signs and the nursing notes.  Patient's presentation is most consistent with acute presentation with potential threat to life or bodily function.  Patient presents emergency department for acute onset of weakness with near syncope.  Patient's hemoglobin yesterday noted to be 5.7.  Reassuringly normal/hyperglycemic fingerstick today.  I suspect he may be coming near syncopal from his significant anemia.  Will attempt to obtain blood products for the patient as he should have 2 units ready for transfusion.  We will check labs we will obtain a CT scan we will continue to closely monitor while awaiting results.  Differential would also include metabolic or electrolyte abnormality, CVA or ICH, seizure activity, less likely infectious etiology.  Hemoglobin has resulted at 3.9.  Very likely the cause of  the patient's syncopal episode this morning.  I called blood bank.  They state blood should be ready within 20 to 30 minutes.  Patient is awake alert mentating.  I have also ordered a 3 unit of blood for transfusion.  I reviewed the patient's recent note from Dr. Cathie Hoops yesterday, anemia thought to be due to bone marrow suppression and CKD recent CT scan of the abdomen pelvis showed no bleed or retroperitoneal hemorrhage.  Patient does not appear to have any abdominal pain today.   CRITICAL CARE Performed by: Minna Antis   Total critical care time: 30 minutes  Critical care time was exclusive of separately billable procedures and treating other patients.  Critical care was necessary to treat or prevent imminent or life-threatening  deterioration.  Critical care was time spent personally by me on the following activities: development of treatment plan with patient and/or surrogate as well as nursing, discussions with consultants, evaluation of patient's response to treatment, examination of patient, obtaining history from patient or surrogate, ordering and performing treatments and interventions, ordering and review of laboratory studies, ordering and review of radiographic studies, pulse oximetry and re-evaluation of patient's condition.   FINAL CLINICAL IMPRESSION(S) / ED DIAGNOSES   Weakness Symptomatic anemia   Note:  This document was prepared using Dragon voice recognition software and may include unintentional dictation errors.   Minna Antis, MD 09/21/22 1447

## 2022-09-21 NOTE — Telephone Encounter (Signed)
-----   Message from Rickard Patience, MD sent at 09/20/2022  5:37 PM EDT ----- His iron level can be further improved with IV Venofer. Please add Venofer to his next visit with NP, also with MD in 2 weeks too. Thanks.

## 2022-09-21 NOTE — ED Notes (Signed)
IV team at bedside 

## 2022-09-21 NOTE — Consult Note (Signed)
Urology Consult  I have been asked to see the patient by Dr. Clyde Lundborg, for evaluation and management of bilateral hydroureteronephrosis.  Chief Complaint: Loss of consciousness  History of Present Illness: Keith Howe is a 82 y.o. year old male with metastatic prostate cancer on chemo managed by Dr. Cathie Hoops, BPH, CKD 3B, DM, and anemia who [facility]/unresponsiveness.  He also came in with new findings of bilateral hydroureteronephrosis as well as a distended urinary bladder.  Admission labs notable for creatinine 1.94, with fluctuating baseline between 1.3 and 2.5; WBC count 11.2; and hemoglobin 5.7, previously 7.5 7 days prior.  He was admitted for management of symptomatic anemia.  This morning, hemoglobin is down to 3.9 and WBC count is up to 12.3.  Creatinine up, 2.12.  Plans are to transfuse 4 units PRBCs today.  Foley catheter has been placed and is draining clear, yellow urine.  He is accompanied today by his wife and niece at the bedside.  He is sleeping, but arousable.  He denies any bladder discomfort or relief of abdominal pressure with Foley placement.  Past Medical History:  Diagnosis Date   BPH (benign prostatic hyperplasia)    Cancer (HCC)    Chronic kidney disease    STAGE 3   Diabetes mellitus without complication (HCC)    HOH (hard of hearing)    AIDS   Hypertension    Inguinal hernia    Pain    CHRONIC LBP   Prostate cancer [facility])     Past Surgical History:  Procedure Laterality Date   BACK SURGERY     Lumbar   CATARACT EXTRACTION W/PHACO Right 07/10/2017   Procedure: CATARACT EXTRACTION PHACO AND INTRAOCULAR LENS PLACEMENT (IOC);  Surgeon: Galen Manila, MD;  Location: [facility] ORS;  Service: Ophthalmology;  Laterality: Right;  [place] 00:29.2 AP% 16.5 CDE 4.83 Fluid Pack Lot # [id number]   COLONOSCOPY     EYE SURGERY Bilateral    cataract extraction   HERNIA REPAIR     INGUINAL   PROSTATE BIOPSY N/A  04/08/2020   Procedure: PROSTATE BIOPSY Addison Bailey;  Surgeon: Orson Ape, MD;  Location: [facility] ORS;  Service: Urology;  Laterality: N/A;   TOTAL KNEE ARTHROPLASTY Left 03/19/2018   Procedure: TOTAL KNEE ARTHROPLASTY;  Surgeon: Juanell Fairly, MD;  Location: [facility] ORS;  Service: Orthopedics;  Laterality: Left;    Home Medications:  Current Meds  Medication Sig   amLODipine (NORVASC) 5 MG tablet Take 5 mg by mouth daily.   aspirin EC 81 MG tablet Take 81 mg by mouth daily.   cholecalciferol (VITAMIN D) 1000 units tablet Take 1,000 Units by mouth daily.   dapagliflozin propanediol (FARXIGA) 10 MG TABS tablet Take 10 mg by mouth daily.   dexamethasone (DECADRON) 4 MG tablet Take 2 tabs by mouth daily starting the day before chemo. Then take 2 tabs daily for 2 days starting day after chemo. Take with food.   Iron-Vitamin C 65-125 MG TABS Take 1 tablet by mouth daily.   loratadine (CLARITIN) 10 MG tablet Take 10 mg by mouth once daily before chemo and at least 2 days after injection.   losartan (COZAAR) 100 MG tablet Take 100 mg by mouth daily.   lovastatin (MEVACOR) 40 MG tablet TAKE 1 TABLET BY MOUTH EVERY DAY (Patient taking differently: Take 40 mg by mouth at bedtime.)   metoprolol tartrate (LOPRESSOR) 50 MG tablet TAKE 1 TABLET BY MOUTH EVERY 12  HOURS (Patient taking differently: Take 50 mg by mouth 2 (two) times daily.)   Misc Natural Products (PROSTATE SUPPORT PO) Take 2 capsules by mouth daily.   NOVOLOG FLEXPEN 100 UNIT/ML FlexPen Inject into the skin. Per sliding scale   Omega-3 Fatty Acids (FISH OIL PO) Take 1 capsule by mouth daily.    ondansetron (ZOFRAN) 8 MG tablet Take 1 tablet (8 mg total) by mouth every 8 (eight) hours as needed for nausea or vomiting.   prochlorperazine (COMPAZINE) 10 MG tablet Take 1 tablet (10 mg total) by mouth every 6 (six) hours as needed for nausea or vomiting.   TRESIBA FLEXTOUCH 200 UNIT/ML SOPN Inject 80 Units into the muscle daily before breakfast.     TRULICITY 1.5 MG/0.5ML SOPN Inject into the skin.   vitamin B-12 (CYANOCOBALAMIN) 500 MCG tablet Take 500 mcg by mouth daily.    Allergies: No Known Allergies  Family History  Problem Relation Age of Onset   Diabetes Father    Breast cancer Sister     Social History:  reports that he has never smoked. He has never used smokeless tobacco. He reports that he does not drink alcohol and does not use drugs.  ROS: A complete review of systems was performed.  All systems are negative except for pertinent findings as noted.  Physical Exam:  Vital signs in last 24 hours: Temp:  [97.5 F (36.4 C)-98 F (36.7 C)] 98 F (36.7 C) (05/16 1314) Pulse Rate:  [74-146] 146 (05/16 1400) Resp:  [15-26] 26 (05/16 1400) BP: (99-147)/(45-112) 108/67 (05/16 1400) SpO2:  [97 %-100 %] 100 % (05/16 1400) Weight:  [105 kg] 105 kg (05/16 1107) Constitutional:  Alert, no acute distress, chronically ill-appearing HEENT: [place] AT, moist mucus membranes Cardiovascular: No clubbing, cyanosis, or edema Respiratory: Normal respiratory effort Neurologic: Grossly intact, no focal deficits, moving all 4 extremities Psychiatric: Normal mood and affect  Laboratory Data:  Recent Labs    09/20/22 0849 09/21/22 0931  WBC 11.2* 12.3*  HGB 5.7* 3.9*  HCT 18.5* 12.8*   Recent Labs    09/20/22 0849 09/21/22 0931  NA 138 135  K 4.4 4.8  CL 106 106  CO2 17* 14*  GLUCOSE 258* 285*  BUN 83* 103*  CREATININE 1.94* 2.12*  CALCIUM 8.6* 8.3*   Urinalysis    Component Value Date/Time   COLORURINE STRAW (A) 08/02/2022 1109   APPEARANCEUR CLEAR (A) 08/02/2022 1109   LABSPEC 1.011 08/02/2022 1109   PHURINE 5.0 08/02/2022 1109   GLUCOSEU 150 (A) 08/02/2022 1109   HGBUR NEGATIVE 08/02/2022 1109   BILIRUBINUR NEGATIVE 08/02/2022 1109   KETONESUR NEGATIVE 08/02/2022 1109   PROTEINUR NEGATIVE 08/02/2022 1109   NITRITE NEGATIVE 08/02/2022 1109   LEUKOCYTESUR NEGATIVE 08/02/2022 1109   Results for orders placed or  performed during the hospital encounter of 09/21/22  MRSA Next Gen by PCR, Nasal     Status: None   Collection Time: 09/21/22 12:50 PM   Specimen: Nasal Mucosa; Nasal Swab  Result Value Ref Range Status   MRSA by PCR Next Gen NOT DETECTED NOT DETECTED Final    Comment: (NOTE) The GeneXpert MRSA Assay (FDA approved for NASAL specimens only), is one component of a comprehensive MRSA colonization surveillance program. It is not intended to diagnose MRSA infection nor to guide or monitor treatment for MRSA infections. Test performance is not FDA approved in patients less than 91 years old. Performed at [facility], [address] [place], [place] [address]  Radiologic Imaging: CT HEAD WO CONTRAST ( )  Result Date: 09/21/2022 CLINICAL DATA:  Altered mental status EXAM: CT HEAD WITHOUT CONTRAST TECHNIQUE: Contiguous axial images were obtained from the base of the skull through the vertex without intravenous contrast. RADIATION DOSE REDUCTION: This exam was performed according to the departmental dose-optimization program which includes automated exposure control, adjustment of the mA and/or kV according to patient size and/or use of iterative reconstruction technique. COMPARISON:  CT head 07/06/15 FINDINGS: Brain: No hemorrhage. No hydrocephalus. No extra-axial fluid collection. No CT evidence of an acute cortical infarct. No mass effect. No mass lesion. There is mild overall chronic microvascular ischemic change. Vascular: No hyperdense vessel or unexpected calcification. Skull: Normal. Negative for fracture or focal lesion. Sinuses/Orbits: No middle ear or mastoid effusion. Paranasal sinuses are clear. Orbits are unremarkable. Other: None. IMPRESSION: No acute intracranial process. No specific etiology for altered mental status identified. Electronically Signed   By: Lorenza Cambridge M.D.   On: 09/21/2022 10:12   CT Abdomen Pelvis Wo Contrast  Result Date: 09/20/2022 CLINICAL DATA:   Symptomatic anemia. Clinical suspicion for retroperitoneal hemorrhage. EXAM: CT ABDOMEN AND PELVIS WITHOUT CONTRAST TECHNIQUE: Multidetector CT imaging of the abdomen and pelvis was performed following the standard protocol without IV contrast. RADIATION DOSE REDUCTION: This exam was performed according to the departmental dose-optimization program which includes automated exposure control, adjustment of the mA and/or kV according to patient size and/or use of iterative reconstruction technique. COMPARISON:  05/13/2020 FINDINGS: Lower chest: No acute findings. Hepatobiliary: No mass visualized on this unenhanced exam. Gallbladder is unremarkable. No evidence of biliary ductal dilatation. Pancreas: No mass or inflammatory process visualized on this unenhanced exam. Spleen:  Within normal limits in size. Adrenals/Urinary tract: No evidence of urolithiasis. No suspicious renal masses identified. Moderate to severe bilateral hydroureteronephrosis is seen to the level of the bladder, which is new since previous study. No ureteral calculi or other obstructing etiology visualized. Distended urinary bladder noted with mild diffuse wall thickening. Stomach/Bowel: No evidence of obstruction, inflammatory process, or abnormal fluid collections. Normal appendix visualized. Moderate colonic stool burden noted. Vascular/Lymphatic: No pathologically enlarged lymph nodes identified. No evidence of abdominal aortic aneurysm. Aortic atherosclerotic calcification incidentally noted. Reproductive: Moderately enlarged prostate gland, without significant change. Other:  None.  No evidence of retroperitoneal hemorrhage. Musculoskeletal:  No suspicious bone lesions identified. IMPRESSION: No evidence of retroperitoneal hemorrhage. New moderate to severe bilateral hydroureteronephrosis to the level of the bladder. No ureteral calculi or other obstructing etiology visualized. This is likely due to vesicoureteral reflux given distended  urinary bladder. Distended urinary bladder with mild diffuse wall thickening, presumably due to chronic bladder outlet obstruction. Moderately enlarged prostate gland, without significant change. Moderate colonic stool burden; recommend correlation for symptoms or signs of constipation. Electronically Signed   By: Danae Orleans M.D.   On: 09/20/2022 12:49    Assessment & Plan:  82 year old male with metastatic prostate cancer and anemia admitted with symptomatic anemia and urinary retention with bilateral hydroureteronephrosis, now s/p Foley placement and with 4 units PRBC transfusion in progress.  Recommend renal ultrasound in 72 hours to reassess hydronephrosis in the setting of renal decompression.  After he receives transfusion and his BP stabilizes, may consider starting an alpha-blocker to increase his ability to void in the long-term.  Would recommend keeping Foley catheter in place for now with plans for outpatient follow-up in our clinic in 2 to 3 weeks to discuss bladder management options.  Will defer management of his prostate cancer  to oncology.  Recommendations: -Continue Foley catheter, trend BMP -Renal ultrasound in 72 hours to reassess hydroureteronephrosis -Consider starting alpha-blocker when BP stabilizes -Outpatient follow-up in 2 to 3 weeks to discuss bladder management options  Thank you for involving me in this patient's care, I will continue to follow along.  Carman Ching, PA-C 09/21/2022 3:33 PM

## 2022-09-21 NOTE — H&P (Addendum)
History and Physical    Keith Howe ZOX:[id number] DOB: 07-Nov-1940 DOA: 09/21/2022  Referring MD/NP/PA:   PCP: Rickard Patience, MD   Patient coming from:  The patient is coming from home.     Chief Complaint:  syncope  HPI: Keith Howe is a 82 y.o. male with medical history significant of prostate cancer on chemotherapy, hypertension, diabetes mellitus, obesity, BPH, CKD-3B, chronic back pain, hard of hearing, anemia, who presents with syncope.  Per his wife at bedside, pt went to cancer center for getting 2 unit of blood transfusion due to anemia. Pt had Hgb 5.7 on 09/20/22.  Per report, while being in the elevator, pt passed out, gazing at the ceiling. His caroid and radial pulse are palpable per report. Pt regains conscious during transport to ED. When I saw pt on the floor, pt is alert oriented x 3.  Patient moves all extremities.  No facial droop or slurred speech.  Patient denies dizziness, chest pain, cough, shortness breath.  No nausea, vomiting, diarrhea or abdominal pain.  Denies symptoms of UTI.  His wife states that patient intermittently notices dark stool, but pt denies this. No rectal bleeding. Of note, patient had CT scan of abdomen/pelvis yesterday which was negative for internal hemorrhage, but showed  bilateral hydroureteronephrosis to the level of the bladder, due to possible chronic bladder outlet obstruction. Pt also had anemia panel on 09/20/22 which was not consistent with iron deficiency (iron 87 and ferritin 67).  Data reviewed independently and ED Course: pt was found to have hemoglobin 3.9 (7.5 on 09/13/2022 -->  5.7 on 09/20/2022), WBC 12.3, worsening renal function, temperature normal, blood pressure 102/47, heart rate of 110 --> 140s, RR 23, oxygen saturation 97% on room air.  CT of head is negative. Pt is admitted to SDU as inpt. Dr. Cathie Hoops of oncology, Dr. Timothy Lasso of GI, and Dr. Richardo Hanks of urology are consulted.    CT-abd/pelvis on 09/20/22 No evidence of retroperitoneal  hemorrhage.   New moderate to severe bilateral hydroureteronephrosis to the level of the bladder. No ureteral calculi or other obstructing etiology visualized. This is likely due to vesicoureteral reflux given distended urinary bladder.   Distended urinary bladder with mild diffuse wall thickening, presumably due to chronic bladder outlet obstruction.   Moderately enlarged prostate gland, without significant change.   Moderate colonic stool burden; recommend correlation for symptoms or signs of constipation.   EKG: I have personally reviewed.  Sinus rhythm, QTc 433, PVC, early R wave progression, ST depression in inferior leads and precordial leads.   Review of Systems:   General: no fevers, chills, no body weight gain, fatigue HEENT: no blurry vision, hearing changes or sore throat Respiratory: no dyspnea, coughing, wheezing CV: no chest pain, no palpitations GI: no nausea, vomiting, abdominal pain, diarrhea, constipation GU: no dysuria, burning on urination, increased urinary frequency, hematuria  Ext: has leg edema Neuro: no unilateral weakness, numbness, or tingling, no vision change or hearing loss. Has syncope Skin: no rash, no skin tear. MSK: No muscle spasm, no deformity, no limitation of range of movement in spin Heme: No easy bruising.  Travel history: No recent long distant travel.   Allergy: No Known Allergies  Past Medical History:  Diagnosis Date   BPH (benign prostatic hyperplasia)    Cancer (HCC)    Chronic kidney disease    STAGE 3   Diabetes mellitus without complication (HCC)    HOH (hard of hearing)    AIDS   Hypertension  Inguinal hernia    Pain    CHRONIC LBP   Prostate cancer [facility])     Past Surgical History:  Procedure Laterality Date   BACK SURGERY     Lumbar   CATARACT EXTRACTION W/PHACO Right 07/10/2017   Procedure: CATARACT EXTRACTION PHACO AND INTRAOCULAR LENS PLACEMENT (IOC);  Surgeon: Galen Manila, MD;  Location: [facility] ORS;   Service: Ophthalmology;  Laterality: Right;  [place] 00:29.2 AP% 16.5 CDE 4.83 Fluid Pack Lot # [id number]   COLONOSCOPY     EYE SURGERY Bilateral    cataract extraction   HERNIA REPAIR     INGUINAL   PROSTATE BIOPSY N/A 04/08/2020   Procedure: PROSTATE BIOPSY Addison Bailey;  Surgeon: Orson Ape, MD;  Location: [facility] ORS;  Service: Urology;  Laterality: N/A;   TOTAL KNEE ARTHROPLASTY Left 03/19/2018   Procedure: TOTAL KNEE ARTHROPLASTY;  Surgeon: Juanell Fairly, MD;  Location: [facility] ORS;  Service: Orthopedics;  Laterality: Left;    Social History:  reports that he has never smoked. He has never used smokeless tobacco. He reports that he does not drink alcohol and does not use drugs.  Family History:  Family History  Problem Relation Age of Onset   Diabetes Father    Breast cancer Sister      Prior to Admission medications   Medication Sig Start Date End Date Taking? Authorizing Provider  amLODipine (NORVASC) 5 MG tablet Take 2.5 mg by mouth daily.    [provider]  aspirin EC 81 MG tablet Take 81 mg by mouth daily.    [provider]  cholecalciferol (VITAMIN D) 1000 units tablet Take 1,000 Units by mouth daily.    [provider]  dapagliflozin propanediol (FARXIGA) 10 MG TABS tablet Take by mouth daily.    [provider]  dexamethasone (DECADRON) 4 MG tablet Take 2 tabs by mouth daily starting the day before chemo. Then take 2 tabs daily for 2 days starting day after chemo. Take with food. 06/20/22   Rickard Patience, MD  Iron-Vitamin C 65-125 MG TABS Take 1 tablet by mouth daily. 08/23/22   Rickard Patience, MD  loratadine (CLARITIN) 10 MG tablet Take 10 mg by mouth once daily before chemo and at least 2 days after injection.    [provider]  losartan (COZAAR) 100 MG tablet Take 100 mg by mouth daily.    [provider]  lovastatin (MEVACOR) 40 MG tablet TAKE 1 TABLET BY MOUTH EVERY DAY 04/25/22   Corky Downs, MD  metoprolol tartrate  (LOPRESSOR) 50 MG tablet TAKE 1 TABLET BY MOUTH EVERY 12 HOURS 11/25/21   Corky Downs, MD  Misc Natural Products (PROSTATE SUPPORT PO) Take 2 capsules by mouth daily.    [provider]  NOVOLOG FLEXPEN 100 UNIT/ML FlexPen Inject into the skin. Per sliding scale 01/03/18   [provider]  Omega-3 Fatty Acids (FISH OIL PO) Take 1 capsule by mouth daily.     [provider]  ondansetron (ZOFRAN) 8 MG tablet Take 1 tablet (8 mg total) by mouth every 8 (eight) hours as needed for nausea or vomiting. 06/20/22   Rickard Patience, MD  prochlorperazine (COMPAZINE) 10 MG tablet Take 1 tablet (10 mg total) by mouth every 6 (six) hours as needed for nausea or vomiting. 06/20/22   Rickard Patience, MD  TRESIBA FLEXTOUCH 200 UNIT/ML SOPN Inject 80 Units into the muscle daily before breakfast.  06/20/17   [provider]  TRULICITY 1.5 MG/0.5ML SOPN Inject into the skin.  08/10/22   [provider]  vitamin B-12 (CYANOCOBALAMIN) 500 MCG tablet Take 500 mcg by mouth daily.    [provider]    Physical Exam: Vitals:   09/21/22 1300 09/21/22 1314 09/21/22 1400 09/21/22 1600  BP: (!) 99/47 (!) 99/47 108/67 (!) 134/54  Pulse: (!) 117 (!) 126 (!) 146 (!) 116  Resp: 17 19 (!) 26 19  Temp:  98 F (36.7 C)  98.8 F (37.1 C)  TempSrc:      SpO2: 98% 97% 100% 100%  Weight:      Height:       General: Not in acute distress. Pale looking HEENT:       Eyes: PERRL, EOMI, no scleral icterus.       ENT: No discharge from the ears and nose, no pharynx injection, no tonsillar enlargement.        Neck: No JVD, no bruit, no mass felt. Heme: No neck lymph node enlargement. Cardiac: S1/S2, RRR, No murmurs, No gallops or rubs. Respiratory: No rales, wheezing, rhonchi or rubs. GI: Soft, nondistended, nontender, no rebound pain, no organomegaly, BS present. GU: No hematuria Ext: has 1+ pitting leg edema bilaterally. 1+DP/PT pulse bilaterally. Musculoskeletal: No joint deformities, No  joint redness or warmth, no limitation of ROM in spin. Skin: No rashes.  Neuro: Alert, oriented X3, cranial nerves II-XII grossly intact, moves all extremities normally. Psych: Patient is not psychotic, no suicidal or hemocidal ideation.  Labs on Admission: I have personally reviewed following labs and imaging studies  CBC: Recent Labs  Lab 09/20/22 0849 09/21/22 0931  WBC 11.2* 12.3*  NEUTROABS 10.0*  --   HGB 5.7* 3.9*  HCT 18.5* 12.8*  MCV 93.0 97.7  PLT 311 227   Basic Metabolic Panel: Recent Labs  Lab 09/20/22 0849 09/21/22 0931  NA 138 135  K 4.4 4.8  CL 106 106  CO2 17* 14*  GLUCOSE 258* 285*  BUN 83* 103*  CREATININE 1.94* 2.12*  CALCIUM 8.6* 8.3*   GFR: Estimated Creatinine Clearance: 34.7 mL/min (A) (by C-G formula based on SCr of 2.12 mg/dL (H)). Liver Function Tests: Recent Labs  Lab 09/20/22 0849 09/21/22 0931  AST 34 37  ALT 16 27  ALKPHOS 36* 36*  BILITOT 0.3 0.6  PROT 5.7* 4.8*  ALBUMIN 3.1* 2.6*   No results for input(s): "LIPASE", "AMYLASE" in the last 168 hours. No results for input(s): "AMMONIA" in the last 168 hours. Coagulation Profile: No results for input(s): "INR", "PROTIME" in the last 168 hours. Cardiac Enzymes: No results for input(s): "CKTOTAL", "CKMB", "CKMBINDEX", "TROPONINI" in the last 168 hours. BNP (last 3 results) No results for input(s): "PROBNP" in the last 8760 hours. HbA1C: No results for input(s): "HGBA1C" in the last 72 hours. CBG: Recent Labs  Lab 09/21/22 0924 09/21/22 1146  GLUCAP 253* 269*   Lipid Profile: No results for input(s): "CHOL", "HDL", "LDLCALC", "TRIG", "CHOLHDL", "LDLDIRECT" in the last 72 hours. Thyroid Function Tests: No results for input(s): "TSH", "T4TOTAL", "FREET4", "T3FREE", "THYROIDAB" in the last 72 hours. Anemia Panel: Recent Labs    09/20/22 0849  FERRITIN 67  TIBC 298  IRON 87   Urine analysis:    Component Value Date/Time   COLORURINE STRAW (A) 08/02/2022 1109    APPEARANCEUR CLEAR (A) 08/02/2022 1109   LABSPEC 1.011 08/02/2022 1109   PHURINE 5.0 08/02/2022 1109   GLUCOSEU 150 (A) 08/02/2022 1109   HGBUR NEGATIVE 08/02/2022 1109   BILIRUBINUR NEGATIVE 08/02/2022 1109   KETONESUR NEGATIVE  08/02/2022 1109   PROTEINUR NEGATIVE 08/02/2022 1109   NITRITE NEGATIVE 08/02/2022 1109   LEUKOCYTESUR NEGATIVE 08/02/2022 1109   Sepsis Labs: @LABRCNTIP (procalcitonin:4,lacticidven:4) ) Recent Results (from the past 240 hour(s))  MRSA Next Gen by PCR, Nasal     Status: None   Collection Time: 09/21/22 12:50 PM   Specimen: Nasal Mucosa; Nasal Swab  Result Value Ref Range Status   MRSA by PCR Next Gen NOT DETECTED NOT DETECTED Final    Comment: (NOTE) The GeneXpert MRSA Assay (FDA approved for NASAL specimens only), is one component of a comprehensive MRSA colonization surveillance program. It is not intended to diagnose MRSA infection nor to guide or monitor treatment for MRSA infections. Test performance is not FDA approved in patients less than 32 years old. Performed at [facility], [address] [place], [place] [address]      Radiological Exams on Admission: CT HEAD WO CONTRAST ( )  Result Date: 09/21/2022 CLINICAL DATA:  Altered mental status EXAM: CT HEAD WITHOUT CONTRAST TECHNIQUE: Contiguous axial images were obtained from the base of the skull through the vertex without intravenous contrast. RADIATION DOSE REDUCTION: This exam was performed according to the departmental dose-optimization program which includes automated exposure control, adjustment of the mA and/or kV according to patient size and/or use of iterative reconstruction technique. COMPARISON:  CT head 07/06/15 FINDINGS: Brain: No hemorrhage. No hydrocephalus. No extra-axial fluid collection. No CT evidence of an acute cortical infarct. No mass effect. No mass lesion. There is mild overall chronic microvascular ischemic change. Vascular: No hyperdense vessel or unexpected  calcification. Skull: Normal. Negative for fracture or focal lesion. Sinuses/Orbits: No middle ear or mastoid effusion. Paranasal sinuses are clear. Orbits are unremarkable. Other: None. IMPRESSION: No acute intracranial process. No specific etiology for altered mental status identified. Electronically Signed   By: Lorenza Cambridge M.D.   On: 09/21/2022 10:12   CT Abdomen Pelvis Wo Contrast  Result Date: 09/20/2022 CLINICAL DATA:  Symptomatic anemia. Clinical suspicion for retroperitoneal hemorrhage. EXAM: CT ABDOMEN AND PELVIS WITHOUT CONTRAST TECHNIQUE: Multidetector CT imaging of the abdomen and pelvis was performed following the standard protocol without IV contrast. RADIATION DOSE REDUCTION: This exam was performed according to the departmental dose-optimization program which includes automated exposure control, adjustment of the mA and/or kV according to patient size and/or use of iterative reconstruction technique. COMPARISON:  05/13/2020 FINDINGS: Lower chest: No acute findings. Hepatobiliary: No mass visualized on this unenhanced exam. Gallbladder is unremarkable. No evidence of biliary ductal dilatation. Pancreas: No mass or inflammatory process visualized on this unenhanced exam. Spleen:  Within normal limits in size. Adrenals/Urinary tract: No evidence of urolithiasis. No suspicious renal masses identified. Moderate to severe bilateral hydroureteronephrosis is seen to the level of the bladder, which is new since previous study. No ureteral calculi or other obstructing etiology visualized. Distended urinary bladder noted with mild diffuse wall thickening. Stomach/Bowel: No evidence of obstruction, inflammatory process, or abnormal fluid collections. Normal appendix visualized. Moderate colonic stool burden noted. Vascular/Lymphatic: No pathologically enlarged lymph nodes identified. No evidence of abdominal aortic aneurysm. Aortic atherosclerotic calcification incidentally noted. Reproductive: Moderately  enlarged prostate gland, without significant change. Other:  None.  No evidence of retroperitoneal hemorrhage. Musculoskeletal:  No suspicious bone lesions identified. IMPRESSION: No evidence of retroperitoneal hemorrhage. New moderate to severe bilateral hydroureteronephrosis to the level of the bladder. No ureteral calculi or other obstructing etiology visualized. This is likely due to vesicoureteral reflux given distended urinary bladder. Distended urinary bladder with mild diffuse wall thickening,  presumably due to chronic bladder outlet obstruction. Moderately enlarged prostate gland, without significant change. Moderate colonic stool burden; recommend correlation for symptoms or signs of constipation. Electronically Signed   By: Danae Orleans M.D.   On: 09/20/2022 12:49      Assessment/Plan Principal Problem:   Symptomatic anemia Active Problems:   Normocytic anemia   Syncope   Essential hypertension   Prostate cancer (HCC)   Type II diabetes mellitus with renal manifestations (HCC)   Chronic kidney disease, stage 3b (HCC)   Leukocytosis   Bilateral hydronephrosis   Bilateral leg edema   Obesity (BMI 30-39.9)   Assessment and Plan:  Symptomatic anemia and hx of normocytic anemia: Hgb 7.5 --> 5.7 --> 3.9.  His wife states that patient intermittently notices dark stool, but pt denies this, not sure if pt has GI bleeding. Pt has CKD-3b and is on chemotherapy, which may have also contributed.  Consulted Dr. Cathie Hoops of oncology and Dr. Timothy Lasso of GI.  - will admitted to SDU inpatient - hold ASA - transfuse 4 units of blood now - clear diet now - IVF: 500 cc of NS bolus, then at 75 mL/hr - Start IV pantoprazole 40 mg bid - Zofran IV for nausea - Avoid NSAIDs and SQ heparin - Maintain IV access (2 large bore IVs if possible). - Monitor closely and follow q6h cbc, transfuse as necessary, if Hgb<7.0 - LaB: INR, PTT and type screen  Addendum: pt developed hypotension after given 2 units of  blood transfusion, Bp 65/33 -midodrine 10 mg tid -started Levophed gtt -consulted Dr. Karna Christmas of ICU   Syncope: CT head negative.  No focal neurodeficit on physical examination.  Possibly due to volume depletion secondary to severe anemia. -Frequent neurocheck -Follow-up caution -IV fluid as above  Essential hypertension -Hold blood pressure medications: Amlodipine, Cozaar, metoprolol due to severe anemia and high risk of developing hypotension  Prostate cancer [facility]): On chemotherapy, last dose of chemo was on 08/23/2022.  Patient is following up with Dr. Cathie Hoops of oncology -Consulted Dr. Cathie Hoops today  Type II diabetes mellitus with renal manifestations [facility]): Patient is taking NovoLog, Farxiga, Trulicity, Tresiba 80 units daily.  A1c 6.8 recently, well-controlled. -Sliding scale insulin -Glargine insulin 60 units daily  Chronic kidney disease, stage 3b [facility]): Patient has worsening renal function.  Recent baseline creatinine 1.3-1.5, his creatinine is 2.12, BUN 103, GFR 31. -IV fluid as above -Cozaar is on hold -Avoid using renal toxic medications  Leukocytosis: WBC 12.3, no fever, no source of infection identified, likely reactive.  -Follow-up CBC -f/u UA  Bilateral hydronephrosis: CT showed new moderate to severe bilateral hydroureteronephrosis to the level of the bladder, possibly due to chronic bladder outlet obstruction. -ordered Foley catheter -consulted Dr. Richardo Hanks of urology  Bilateral leg edema: Patient has bilateral symmetric 1+ leg edema, no history of CHF.  No 2D echo on record.  -Check BNP  Obesity (BMI 30-39.9): Body weight 105 kg, BMI 30.54 -Healthy diet and exercise -Encourage losing weight     DVT ppx: SCD  Code Status: Full code  Family Communication:  Yes, patient's wife and niece    at bed side.    Disposition Plan:  Anticipate discharge back to previous environment  Consults called: Dr. Cathie Hoops of oncology, Dr. Timothy Lasso of GI, and Dr. Richardo Hanks of urology are  consulted.  Admission status and Level of care: Stepdown:    as inpt       Dispo: The patient is from: Home  Anticipated d/c is to: Home              Anticipated d/c date is: 2 days              Patient currently is not medically stable to d/c.    Severity of Illness:  The appropriate patient status for this patient is INPATIENT. Inpatient status is judged to be reasonable and necessary in order to provide the required intensity of service to ensure the patient's safety. The patient's presenting symptoms, physical exam findings, and initial radiographic and laboratory data in the context of their chronic comorbidities is felt to place them at high risk for further clinical deterioration. Furthermore, it is not anticipated that the patient will be medically stable for discharge from the hospital within 2 midnights of admission.   * I certify that at the point of admission it is my clinical judgment that the patient will require inpatient hospital care spanning beyond 2 midnights from the point of admission due to high intensity of service, high risk for further deterioration and high frequency of surveillance required.*       Date of Service 09/21/2022    Lorretta Harp [facility]   If 7PM-7AM, please contact night-coverage [email or web address] 09/21/2022, 4:14 PM

## 2022-09-21 NOTE — ED Notes (Signed)
RN Herbert Seta is aware  of pt in room. RN Lillia Abed and EDT Mykia at bedside

## 2022-09-21 NOTE — Telephone Encounter (Signed)
-----   Message from Rickard Patience, MD sent at 09/20/2022  5:31 PM EDT ----- Please let patient know that there is new hydronephrosis [swelling of his kidney] due to enlarge prostate. Please refer him urgently to urology

## 2022-09-21 NOTE — Telephone Encounter (Signed)
Please add venofer to appts on 5/22 and 5/29

## 2022-09-21 NOTE — Telephone Encounter (Signed)
Patient came to clinic this morning and was taken to ER. Dr.Yu has called ER and spoke to triage nurse about pt needing foley and urology consult.

## 2022-09-21 NOTE — Progress Notes (Signed)
Nurse responds to medical emergency in the cancer center parking lot. Pt responding to touch and voice but unable to voice what he is here for today.  Multiple nurses, staff members and Dr. Orlie Dakin present.  Patient's wife reports that blood sugar was 200 this AM and 4 units of insulin was given. Juice provided to pt per MD, pt able to drink without difficultly. Pt back to baseline, able to verbalize name, location and reason for visit. Manual B/P 102/44. Pt here for 2 units of blood for a hemoglobin of 5.7. Per Dr. Orlie Dakin okay for pt to report to infusion area and proceed with blood transfusion.  While in the elevator. Pt becomes completley un-responsive and gazing at the ceiling, Caroid and radial pulse palpable. Immediate transport to ER initiated. Pt regains conscious during transport. Pt becomes un-responsive and gazing again once in the ER lobby during check in. ER staff present and pt taken back to room immediately. Pt transferred to an ER stretcher and report given to ER RN and MD.

## 2022-09-21 NOTE — Consult Note (Signed)
Hematology/Oncology Consult note [phone] [phone]      Patient Care Team: Rickard Patience, MD as PCP - General (Oncology) Carmina Miller, MD as Consulting Physician (Radiation Oncology) Rickard Patience, MD as Consulting Physician (Oncology) Mady Haagensen, MD as Consulting Physician (Nephrology) Orson Ape, MD as Consulting Physician (Urology)   Name of the patient: Keith Howe  [id number]  March 06, 1941   REASON FOR COSULTATION:  Symptomatic anemia History of presenting illness-  82 y.o. male with past medical history listed as below who was sent to emergency room for severe anemia. Yesterday hemoglobin was 5.7.  He was asymptomatic yesterday with no lightheadedness, severe fatigue shortness of breath or chest pain.  And the plan was for him to get 2 units of PRBC today.  Iron panel showed normal iron saturation of 29, ferritin level 67.  Patient came for his blood transfusion, and passed out in the cancer center.  He regained consciousness.  No history of blood in the stool.  There is intermittent dark stool, formed, no tarry stool. Patient was on chemotherapy with docetaxel, last treatment was 4 weeks ago on 08/22/2022. Denies nausea, vomiting, diarrhea or abdominal pain, dysuria.  He denies any difficulty passing urine.    A CT scan was done yesterday outpatient for workup of worsening kidney function, rule out retroperitoneal bleeding.  CT was was negative for retroperitoneal hemorrhage..  Incidental findings of bilateral moderate to severe hydronephrosis, distended urinary bladder with mild diffuse wall thickening, moderately enlarged prostate gland without significant changes.  Moderate colonic stool burden  In the emergency room, repeat CBC showed further decrease of hemoglobin.  3.9.  MCV 97.7.  Patient is getting blood transfusion, plan 4 units today. Patient was admitted to ICU  Oncology was consulted for further management.  Urology and GI have been  consulted. No Known Allergies  Patient Active Problem List   Diagnosis Date Noted   Prostate cancer (HCC) 07/13/2021    Priority: High   Bilateral hydronephrosis 09/20/2022    Priority: Medium    Chemotherapy-induced neuropathy (HCC) 09/13/2022    Priority: Medium    Normocytic anemia 07/31/2022    Priority: Medium    CKD (chronic kidney disease) stage 3, GFR 30-59 ml/min (HCC) 05/27/2020    Priority: Medium    Weight loss 08/09/2022    Priority: Low   Chemotherapy induced neutropenia (HCC) 07/05/2022    Priority: Low   Encounter for antineoplastic chemotherapy 05/26/2022    Priority: Low   Goals of care, counseling/discussion 07/13/2021    Priority: Low   Type 2 diabetes mellitus without complication, with long-term current use of insulin (HCC) 10/13/2019    Priority: Low   Symptomatic anemia 09/21/2022   Chronic kidney disease, stage 3b (HCC) 09/21/2022   Leukocytosis 09/21/2022   Syncope 07/31/2022   SIRS (systemic inflammatory response syndrome) (HCC) 07/31/2022   Obesity (BMI 30-39.9) 07/31/2022   Type II diabetes mellitus with renal manifestations (HCC) 07/31/2022   Myocardial injury 07/31/2022   Bilateral leg edema 07/31/2022   Injury of quadriceps muscle 08/26/2020   Annual physical exam 02/18/2020   Need for influenza vaccination 02/18/2020   Essential hypertension 12/15/2019   Class 1 obesity with serious comorbidity and body mass index (BMI) of 31.0 to 31.9 in adult 10/13/2019   S/P TKR (total knee replacement) using cement, left 03/19/2018     Past Medical History:  Diagnosis Date   BPH (benign prostatic hyperplasia)    Cancer (HCC)    Chronic kidney disease  STAGE 3   Diabetes mellitus without complication (HCC)    HOH (hard of hearing)    AIDS   Hypertension    Inguinal hernia    Pain    CHRONIC LBP   Prostate cancer [facility])      Past Surgical History:  Procedure Laterality Date   BACK SURGERY     Lumbar   CATARACT EXTRACTION W/PHACO Right  07/10/2017   Procedure: CATARACT EXTRACTION PHACO AND INTRAOCULAR LENS PLACEMENT (IOC);  Surgeon: Galen Manila, MD;  Location: [facility] ORS;  Service: Ophthalmology;  Laterality: Right;  [place] 00:29.2 AP% 16.5 CDE 4.83 Fluid Pack Lot # [id number]   COLONOSCOPY     EYE SURGERY Bilateral    cataract extraction   HERNIA REPAIR     INGUINAL   PROSTATE BIOPSY N/A 04/08/2020   Procedure: PROSTATE BIOPSY Addison Bailey;  Surgeon: Orson Ape, MD;  Location: [facility] ORS;  Service: Urology;  Laterality: N/A;   TOTAL KNEE ARTHROPLASTY Left 03/19/2018   Procedure: TOTAL KNEE ARTHROPLASTY;  Surgeon: Juanell Fairly, MD;  Location: [facility] ORS;  Service: Orthopedics;  Laterality: Left;    Social History   Socioeconomic History   Marital status: Married    Spouse name: Not on file   Number of children: Not on file   Years of education: Not on file   Highest education level: Some college, no degree  Occupational History   Not on file  Tobacco Use   Smoking status: Never   Smokeless tobacco: Never  Vaping Use   Vaping Use: Never used  Substance and Sexual Activity   Alcohol use: No   Drug use: Never   Sexual activity: Not Currently  Other Topics Concern   Not on file  Social History Narrative   Independent at baseline.  Lives at home with his wife   Social Determinants of Health   Financial Resource Strain: Low Risk  (05/12/2021)   Overall Financial Resource Strain (CARDIA)    Difficulty of Paying Living Expenses: Not hard at all  Food Insecurity: No Food Insecurity (07/31/2022)   Hunger Vital Sign    Worried About Running Out of Food in the Last Year: Never true    Ran Out of Food in the Last Year: Never true  Transportation Needs: No Transportation Needs (07/31/2022)   PRAPARE - Administrator, Civil Service (Medical): No    Lack of Transportation (Non-Medical): No  Physical Activity: Insufficiently Active (05/12/2021)   Exercise Vital Sign    Days of Exercise per Week: 7 days     Minutes of Exercise per Session: 10 min  Stress: No Stress Concern Present (05/12/2021)   [employer] of Occupational Health - Occupational Stress Questionnaire    Feeling of Stress : Not at all  Social Connections: Socially Integrated (05/12/2021)   Social Connection and Isolation Panel [NHANES]    Frequency of Communication with Friends and Family: More than three times a week    Frequency of Social Gatherings with Friends and Family: Once a week    Attends Religious Services: More than 4 times per year    Active Member of [employer] or Organizations: Yes    Attends Banker Meetings: More than 4 times per year    Marital Status: Married  Catering manager Violence: Not At Risk (07/31/2022)   Humiliation, Afraid, Rape, and Kick questionnaire    Fear of Current or Ex-Partner: No    Emotionally Abused: No    Physically Abused: No  Sexually Abused: No     Family History  Problem Relation Age of Onset   Diabetes Father    Breast cancer Sister      Current Facility-Administered Medications:    0.9 %  sodium chloride infusion (Manually program via Guardrails IV Fluids), , Intravenous, Once, Ezequiel Essex, NP   0.9 %  sodium chloride infusion, 10 mL/hr, Intravenous, Once, Minna Antis, MD   0.9 %  sodium chloride infusion, 10 mL/hr, Intravenous, Once, Minna Antis, MD   0.9 %  sodium chloride infusion, , Intravenous, Continuous, Lorretta Harp, MD, Last Rate: 75 mL/hr at 09/21/22 1352, New Bag at 09/21/22 1352   acetaminophen (TYLENOL) tablet 650 mg, 650 mg, Oral, Q6H PRN, Lorretta Harp, MD   Chlorhexidine Gluconate Cloth 2 % PADS 6 each, 6 each, Topical, Daily, Lorretta Harp, MD   cholecalciferol (VITAMIN D3) 25 MCG (1000 UNIT) tablet 1,000 Units, 1,000 Units, Oral, Daily, Lorretta Harp, MD, 1,000 Units at 09/21/22 1349   cyanocobalamin (VITAMIN B12) tablet 500 mcg, 500 mcg, Oral, Daily, Lorretta Harp, MD, 500 mcg at 09/21/22 1347   Fe Fum-Vit C-Vit B12-FA (TRIGELS-F FORTE)  capsule 1 capsule, 1 capsule, Oral, Daily, Lorretta Harp, MD, 1 capsule at 09/21/22 1348   insulin aspart (novoLOG) injection 0-5 Units, 0-5 Units, Subcutaneous, QHS, Lorretta Harp, MD   insulin aspart (novoLOG) injection 0-9 Units, 0-9 Units, Subcutaneous, TID WC, Lorretta Harp, MD, 5 Units at 09/21/22 1350   insulin glargine-yfgn (SEMGLEE) injection 60 Units, 60 Units, Subcutaneous, Daily, Lorretta Harp, MD, 60 Units at 09/21/22 1349   metoprolol tartrate (LOPRESSOR) injection 5 mg, 5 mg, Intravenous, Q2H PRN, Lorretta Harp, MD   midodrine (PROAMATINE) tablet 10 mg, 10 mg, Oral, TID WC, Lorretta Harp, MD   norepinephrine (LEVOPHED) 4mg  in (0.016 mg/mL) premix infusion, 0-40 mcg/min, Intravenous, Titrated, Aleskerov, Fuad, MD, Last Rate: 7.5 mL/hr at 09/21/22 1659, 2 mcg/min at 09/21/22 1659   omega-3 acid ethyl esters (LOVAZA) capsule 1 g, 1 g, Oral, Daily, Lorretta Harp, MD, 1 g at 09/21/22 1350   ondansetron (ZOFRAN) injection 4 mg, 4 mg, Intravenous, Q8H PRN, Lorretta Harp, MD   pantoprazole (PROTONIX) injection 40 mg, 40 mg, Intravenous, Q12H, Lorretta Harp, MD   pravastatin (PRAVACHOL) tablet 40 mg, 40 mg, Oral, q1800, Lorretta Harp, MD  Review of Systems  Constitutional:  Positive for fatigue. Negative for appetite change, chills and fever.  HENT:   Negative for hearing loss and voice change.   Eyes:  Negative for eye problems and icterus.  Respiratory:  Negative for chest tightness, cough and shortness of breath.   Cardiovascular:  Negative for chest pain and leg swelling.  Gastrointestinal:  Negative for abdominal distention and abdominal pain.  Endocrine: Negative for hot flashes.  Genitourinary:  Negative for difficulty urinating, dysuria and frequency.   Musculoskeletal:  Negative for arthralgias.  Skin:  Negative for itching and rash.  Neurological:  Negative for light-headedness and numbness.  Hematological:  Negative for adenopathy. Does not bruise/bleed easily.  Psychiatric/Behavioral:  Negative  for confusion.     PHYSICAL EXAM Vitals:   09/21/22 1300 09/21/22 1314 09/21/22 1400 09/21/22 1600  BP: (!) 99/47 (!) 99/47 108/67 (!) 134/54  Pulse: (!) 117 (!) 126 (!) 146 (!) 116  Resp: 17 19 (!) 26 19  Temp:  98 F (36.7 C)  98.8 F (37.1 C)  TempSrc:      SpO2: 98% 97% 100% 100%  Weight:      Height:  Physical Exam Constitutional:      General: He is not in acute distress.    Appearance: He is not diaphoretic.  HENT:     Head: Normocephalic and atraumatic.     Nose: Nose normal.     Mouth/Throat:     Pharynx: No oropharyngeal exudate.  Eyes:     General: No scleral icterus.    Pupils: Pupils are equal, round, and reactive to light.  Cardiovascular:     Rate and Rhythm: Normal rate and regular rhythm.     Heart sounds: No murmur heard. Pulmonary:     Effort: Pulmonary effort is normal. No respiratory distress.     Breath sounds: No wheezing.  Abdominal:     General: There is no distension.     Palpations: Abdomen is soft.     Tenderness: There is no abdominal tenderness.  Musculoskeletal:        General: Normal range of motion.     Cervical back: Normal range of motion and neck supple.  Skin:    General: Skin is warm and dry.     Findings: No erythema.  Neurological:     Mental Status: He is alert and oriented to person, place, and time. Mental status is at baseline.     Cranial Nerves: No cranial nerve deficit.     Motor: No abnormal muscle tone.  Psychiatric:        Mood and Affect: Mood and affect normal.       LABORATORY STUDIES    Latest Ref Rng & Units 09/21/2022    4:40 PM 09/21/2022    9:31 AM 09/20/2022    8:49 AM  CBC  WBC 4.0 - 10.5 K/uL 11.9  12.3  11.2   Hemoglobin 13.0 - 17.0 g/dL 5.2  3.9  5.7   Hematocrit 39.0 - 52.0 % 16.0  12.8  18.5   Platelets 150 - 400 K/uL 178  227  311       Latest Ref Rng & Units 09/21/2022    9:31 AM 09/20/2022    8:49 AM 09/13/2022    8:49 AM  CMP  Glucose 70 - 99 mg/dL 960  454  098   BUN 8 - 23  mg/dL 119  83  28   Creatinine 0.61 - 1.24 mg/dL 1.47  8.29  5.62   Sodium 135 - 145 mmol/L 135  138  138   Potassium 3.5 - 5.1 mmol/L 4.8  4.4  4.4   Chloride 98 - 111 mmol/L 106  106  104   CO2 22 - 32 mmol/L 14  17  27    Calcium 8.9 - 10.3 mg/dL 8.3  8.6  9.0   Total Protein 6.5 - 8.1 g/dL 4.8  5.7  6.5   Total Bilirubin 0.3 - 1.2 mg/dL 0.6  0.3  0.3   Alkaline Phos 38 - 126 U/L 36  36  56   AST 15 - 41 U/L 37  34  22   ALT 0 - 44 U/L 27  16  18       RADIOGRAPHIC STUDIES: I have personally reviewed the radiological images as listed and agreed with the findings in the report. CT HEAD WO CONTRAST ( )  Result Date: 09/21/2022 CLINICAL DATA:  Altered mental status EXAM: CT HEAD WITHOUT CONTRAST TECHNIQUE: Contiguous axial images were obtained from the base of the skull through the vertex without intravenous contrast. RADIATION DOSE REDUCTION: This exam was performed according to the departmental dose-optimization program which  includes automated exposure control, adjustment of the mA and/or kV according to patient size and/or use of iterative reconstruction technique. COMPARISON:  CT head 07/06/15 FINDINGS: Brain: No hemorrhage. No hydrocephalus. No extra-axial fluid collection. No CT evidence of an acute cortical infarct. No mass effect. No mass lesion. There is mild overall chronic microvascular ischemic change. Vascular: No hyperdense vessel or unexpected calcification. Skull: Normal. Negative for fracture or focal lesion. Sinuses/Orbits: No middle ear or mastoid effusion. Paranasal sinuses are clear. Orbits are unremarkable. Other: None. IMPRESSION: No acute intracranial process. No specific etiology for altered mental status identified. Electronically Signed   By: Lorenza Cambridge M.D.   On: 09/21/2022 10:12   CT Abdomen Pelvis Wo Contrast  Result Date: 09/20/2022 CLINICAL DATA:  Symptomatic anemia. Clinical suspicion for retroperitoneal hemorrhage. EXAM: CT ABDOMEN AND PELVIS WITHOUT  CONTRAST TECHNIQUE: Multidetector CT imaging of the abdomen and pelvis was performed following the standard protocol without IV contrast. RADIATION DOSE REDUCTION: This exam was performed according to the departmental dose-optimization program which includes automated exposure control, adjustment of the mA and/or kV according to patient size and/or use of iterative reconstruction technique. COMPARISON:  05/13/2020 FINDINGS: Lower chest: No acute findings. Hepatobiliary: No mass visualized on this unenhanced exam. Gallbladder is unremarkable. No evidence of biliary ductal dilatation. Pancreas: No mass or inflammatory process visualized on this unenhanced exam. Spleen:  Within normal limits in size. Adrenals/Urinary tract: No evidence of urolithiasis. No suspicious renal masses identified. Moderate to severe bilateral hydroureteronephrosis is seen to the level of the bladder, which is new since previous study. No ureteral calculi or other obstructing etiology visualized. Distended urinary bladder noted with mild diffuse wall thickening. Stomach/Bowel: No evidence of obstruction, inflammatory process, or abnormal fluid collections. Normal appendix visualized. Moderate colonic stool burden noted. Vascular/Lymphatic: No pathologically enlarged lymph nodes identified. No evidence of abdominal aortic aneurysm. Aortic atherosclerotic calcification incidentally noted. Reproductive: Moderately enlarged prostate gland, without significant change. Other:  None.  No evidence of retroperitoneal hemorrhage. Musculoskeletal:  No suspicious bone lesions identified. IMPRESSION: No evidence of retroperitoneal hemorrhage. New moderate to severe bilateral hydroureteronephrosis to the level of the bladder. No ureteral calculi or other obstructing etiology visualized. This is likely due to vesicoureteral reflux given distended urinary bladder. Distended urinary bladder with mild diffuse wall thickening, presumably due to chronic bladder  outlet obstruction. Moderately enlarged prostate gland, without significant change. Moderate colonic stool burden; recommend correlation for symptoms or signs of constipation. Electronically Signed   By: Danae Orleans M.D.   On: 09/20/2022 12:49   US Venous Img Lower Bilateral (DVT)  Result Date: 07/31/2022 CLINICAL DATA:  Acute renal injury with shortness of breath, initial encounter EXAM: BILATERAL LOWER EXTREMITY VENOUS DOPPLER ULTRASOUND TECHNIQUE: Gray-scale sonography with graded compression, as well as color Doppler and duplex ultrasound were performed to evaluate the lower extremity deep venous systems from the level of the common femoral vein and including the common femoral, femoral, profunda femoral, popliteal and calf veins including the posterior tibial, peroneal and gastrocnemius veins when visible. The superficial great saphenous vein was also interrogated. Spectral Doppler was utilized to evaluate flow at rest and with distal augmentation maneuvers in the common femoral, femoral and popliteal veins. COMPARISON:  None Available. FINDINGS: RIGHT LOWER EXTREMITY Common Femoral Vein: No evidence of thrombus. Normal compressibility, respiratory phasicity and response to augmentation. Saphenofemoral Junction: No evidence of thrombus. Normal compressibility and flow on color Doppler imaging. Profunda Femoral Vein: No evidence of thrombus. Normal compressibility and flow on color Doppler  imaging. Femoral Vein: No evidence of thrombus. Normal compressibility, respiratory phasicity and response to augmentation. Popliteal Vein: No evidence of thrombus. Normal compressibility, respiratory phasicity and response to augmentation. Calf Veins: No evidence of thrombus. Normal compressibility and flow on color Doppler imaging. Superficial Great Saphenous Vein: No evidence of thrombus. Normal compressibility. Venous Reflux:  None. Other Findings:  None. LEFT LOWER EXTREMITY Common Femoral Vein: No evidence of  thrombus. Normal compressibility, respiratory phasicity and response to augmentation. Saphenofemoral Junction: No evidence of thrombus. Normal compressibility and flow on color Doppler imaging. Profunda Femoral Vein: No evidence of thrombus. Normal compressibility and flow on color Doppler imaging. Femoral Vein: No evidence of thrombus. Normal compressibility, respiratory phasicity and response to augmentation. Popliteal Vein: No evidence of thrombus. Normal compressibility, respiratory phasicity and response to augmentation. Calf Veins: No evidence of thrombus. Normal compressibility and flow on color Doppler imaging. Superficial Great Saphenous Vein: No evidence of thrombus. Normal compressibility. Venous Reflux:  None. Other Findings:  None. IMPRESSION: No evidence of deep venous thrombosis in either lower extremity. Electronically Signed   By: Alcide Clever M.D.   On: 07/31/2022 19:29   US RENAL  Result Date: 07/31/2022 CLINICAL DATA:  [id number] AKI (acute kidney injury) (HCC) [id number] EXAM: RENAL / URINARY TRACT ULTRASOUND COMPLETE COMPARISON:  None Available. FINDINGS: Right Kidney: Renal measurements: 9.4 x 6.4 x 5.4 cm = volume: 169 mL. Echogenicity within normal limits. No mass. Mild to moderate hydronephrosis visualized. Left Kidney: Renal measurements: 9.4 x 6.1 x 5.5 cm = volume: 168 mL. Echogenicity within normal limits. No mass. Mild to moderate hydronephrosis visualized. Urinary bladder: Appears normal for degree of bladder distention.  Unable to void. Other: Prostate measures 4.3 x 5.6 x 6.3 cm IMPRESSION: 1. Bilateral mild to moderate hydronephrosis. Prominent prostate gland. 2. Patient unable to void per ultrasound technician. Electronically Signed   By: Tish Frederickson M.D.   On: 07/31/2022 19:26   DG Chest Portable 1 View  Result Date: 07/31/2022 CLINICAL DATA:  Provided history: Sepsis. Recent syncopal episodes. History of prostate cancer. EXAM: PORTABLE CHEST 1 VIEW COMPARISON:  Head CT  06/14/2022. Prior chest radiographs 03/06/2018. FINDINGS: Heart size within normal limits. No appreciable airspace consolidation. No evidence of pleural effusion or pneumothorax. Known metastases within the thoracic spine, bilateral ribs and left scapula (demonstrated on the prior PET CT of 06/14/2022). IMPRESSION: No evidence of an acute cardiopulmonary abnormality. Electronically Signed   By: Jackey Loge D.O.   On: 07/31/2022 13:56     Assessment and plan-   # Acute on chronic anemia His hemoglobin progressively decreases despite PRBC transfusion last week. Most likely secondary to delayed bone marrow recovery due to chemotherapy, as well as anemia due to CKD.  Iron panel is not consistent with severe iron deficiency anemia. I agree with PRBC transfusion to keep hemoglobin above 7. Recommend Venofer treatments to further increase iron level in the setting of anemia due to CKD.  Could consider erythropoietin therapy if hemoglobin is not stable despite PRBC transfusion, Venofer treatments. Also discussed with gastroenterology Dr. Timothy Lasso.  Plan EGD tomorrow.  Will hold off colonoscopy at this point due to concern of the prep process to further decrease his kidney function.   # Metastatic prostate cancer, castrate resistance, progressed while on Xtandi Status post 3 cycles of chemotherapy with docetaxel.  He did not tolerate due to recurrent severe anemia I have discussed with patient and family and will discontinue chemotherapy. PSA has increased to 7 days.  Will repeat another  PSA level after urinary retention resolves. Urology recommendation reviewed.  I agree with starting alpha-blocker after his BP is more stabilized.  Keeping Foley catheter in place and outpatient follow-up with urology.   Thank you for allowing me to participate in the care of this patient.   Rickard Patience, MD, PhD Hematology Oncology 09/21/2022

## 2022-09-21 NOTE — Consult Note (Signed)
NAME:  Keith Howe, MRN:  [id number], DOB:  [birth year], LOS: 0 ADMISSION DATE:  09/21/2022, CONSULTATION DATE: 09/21/2022 REFERRING MD: Dr. Clyde Lundborg, CHIEF COMPLAINT: Weakness and LOC   History of Present Illness:  This is an 82 yo male with a PMH of Prostate Cancer (currently receiving chemotherapy), HTN, and CKD who presented to [facility] ER on 05/16 from the infusion center after a brief period of unresponsiveness.  The pt was scheduled to received 2 units of pRBC's for an hgb of 5.7 on 05/15 likely secondary to bone marrow suppression as well as anemia due to CKD per oncology progress notes.  CT Abd Pelvis ordered by outpatient oncologist due to drop in hgb on 09/20/22 which was negative for a bleed or retroperitoneal hemorrhage, but concerning for new moderate to severe bilateral hydroureteronephrosis to the level of the bladder.  Today at the infusion center nurse reported the pt became lethargic with his eyes open and unable to answer questions or follow commands which lasted for a minute.  Pt immediately wheeled to the ER for evaluation.   ED Course  Per ER notes upon arrival to the ER pt appeared weak, but able to answer questions and follow basic commands.  Lab results were significant for: BUN 103/creatinine 2.12/albumin 2.6/wbc 12.3/hgb 3.9/hct 12.8/PT 17.8/INR 1.4/glucose 285.  Pts initial vital signs were stable and pt did not require vasopressors.  Pts family denied signs of acute GI bleed but the pt reported "dark green stools." He received 2 units of pRBC's and was subsequently admitted to the stepdown unit per hospitalist team for additional workup and treatment.  See detailed hospital course below under significant events.    CT Head: No acute intracranial process. No specific etiology for altered mental status identified.  CT Abd Pelvis: No evidence of retroperitoneal hemorrhage. New moderate to severe bilateral hydroureteronephrosis to the level of the bladder. No ureteral calculi or  other obstructing etiology visualized. This is likely due to vesicoureteral reflux given distended urinary bladder. Distended urinary bladder with mild diffuse wall thickening, presumably due to chronic bladder outlet obstruction. Moderately enlarged prostate gland, without significant change. Moderate colonic stool burden; recommend correlation for symptoms or signs of constipation.   Pertinent  Medical History  BPH Prostate Cancer  Stage III CKD Type II Diabetes Mellitus  HOH HTN  Inguinal Hernia   Significant Hospital Events: Including procedures, antibiotic start and stop dates in addition to other pertinent events   05/16: Pt admitted with new moderate to severe bilateral hydroureteronephrosis and hypotension secondary to severe acute on chronic normocytic anemia requiring blood transfusions and levophed gtt.  PCCM consulted   Interim History / Subjective:  Pt lethargic but oriented and following some commands.  Currently requiring levophed gtt at 8 mcg/min to maintain map >65.    Objective   Blood pressure (!) 134/54, pulse (!) 116, temperature 98.8 F (37.1 C), resp. rate 19, height 6\' 1"  (1.854 m), weight 105 kg, SpO2 100 %.        Intake/Output Summary (Last 24 hours) at 09/21/2022 1711 Last data filed at 09/21/2022 1700 Gross per 24 hour  Intake 420 ml  Output 1975 ml  Net -1555 ml   Filed Weights   09/21/22 1107  Weight: 105 kg    Examination: General: Acute on chronically-ill appearing male, NAD RA  HENT: supple, no JVD  Lungs: Clear throughout, even, non labored  Cardiovascular: Sinus tachycardia, s1s2, no m/r/g, 2+ radial/1+ distal pulses, 2+ bilateral lower extremity pitting edema  Abdomen: +BS x4, obese, soft, non tender, non distended  Extremities: Normal bulk and tone Neuro: Lethargic but oriented, following some commands, PERRL  GU: Indwelling foley catheter with hematuria   Resolved Hospital Problem list     Assessment & Plan:  #Hypotension  secondary to severe acute on chronic normocytic anemia due to possible bone marrow suppression due to chemotherapy  #Syncope secondary to hypotension  Hx: HTN  - Continuous telemetry monitoring  - Scheduled midodrine; prn blood products; and/or levophed gtt to maintain map >65 - Hold outpatient antihypertensives and aspirin  - Fall precautions   #Acute kidney injury superimposed on stage IIIb CKD  #Urinary retention with bilateral hydroureteronephrosis  - Trend BMP  - Replace electrolytes as indicated  - Monitor UOP - Avoid nephrotoxic medications when able  - Urology consulted appreciate input: recommending renal US in 72hrs to reassess hydronephrosis in the setting of renal decompression; once bp stabilizes consider starting an alpha-blocker to increase inability to void in the long-term; and keep indwelling foley catheter in place for now with plans for outpatient follow-up in the urology clinic in 2 to 3 weeks to discuss bladder management options   #Symptomatic severe acute on chronic normocytic anemia due to bone marrow suppression due to chemotherapy  - CBC q6hrs  - Transfuse for hgb <7 and/or active sighs of bleeding  - IV iron x1 dose per oncology recommendations  - PPI q12hrs  - Vitamin B12 and retic panel pending  - GI consulted appreciate input: EGD scheduled for 05/17 - Can consider bleeding scan if pt shows signs of active bleeding   #Metastatic prostate cancer, castrate resistance, progressed while on Xtandi  - Oncology consulted appreciate input: discontinuing chemotherapy due to recurrent severe anemia   #Type II diabetes mellitus  - SSI  - CBG's q4hrs   #Acute metabolic encephalopathy  CT Head 09/21/22: negative for acute intracranial process  - Correct metabolic derangements and if mentation does not improve following correction will need MRI Brain  - Avoid sedating medications when able   Best Practice (right click and "Reselect all SmartList Selections"  daily)   Diet/type: clear liquids DVT prophylaxis: SCD GI prophylaxis: PPI Lines: N/A Foley:  Yes, and it is still needed Code Status:  full code Last date of multidisciplinary goals of care discussion [N/A]  Labs   CBC: Recent Labs  Lab 09/20/22 0849 09/21/22 0931 09/21/22 1640  WBC 11.2* 12.3* 11.9*  NEUTROABS 10.0*  --   --   HGB 5.7* 3.9* 5.2*  HCT 18.5* 12.8* 16.0*  MCV 93.0 97.7 88.9  PLT 311 227 178    Basic Metabolic Panel: Recent Labs  Lab 09/20/22 0849 09/21/22 0931  NA 138 135  K 4.4 4.8  CL 106 106  CO2 17* 14*  GLUCOSE 258* 285*  BUN 83* 103*  CREATININE 1.94* 2.12*  CALCIUM 8.6* 8.3*   GFR: Estimated Creatinine Clearance: 34.7 mL/min (A) (by C-G formula based on SCr of 2.12 mg/dL (H)). Recent Labs  Lab 09/20/22 0849 09/21/22 0931 09/21/22 1640  WBC 11.2* 12.3* 11.9*    Liver Function Tests: Recent Labs  Lab 09/20/22 0849 09/21/22 0931  AST 34 37  ALT 16 27  ALKPHOS 36* 36*  BILITOT 0.3 0.6  PROT 5.7* 4.8*  ALBUMIN 3.1* 2.6*   No results for input(s): "LIPASE", "AMYLASE" in the last 168 hours. No results for input(s): "AMMONIA" in the last 168 hours.  ABG No results found for: "PHART", "PCO2ART", "PO2ART", "HCO3", "TCO2", "ACIDBASEDEF", "O2SAT"  Coagulation Profile: Recent Labs  Lab 09/21/22 1640  INR 1.4*    Cardiac Enzymes: No results for input(s): "CKTOTAL", "CKMB", "CKMBINDEX", "TROPONINI" in the last 168 hours.  HbA1C: HbA1c POC (<> result, manual entry)  Date/Time Value Ref Range Status  02/23/2021 09:11 AM 5.6 4.0 - 5.6 % Final   Hgb A1c MFr Bld  Date/Time Value Ref Range Status  07/31/2022 04:23 PM 6.8 (H) 4.8 - 5.6 % Final    Comment:    (NOTE)         Prediabetes: 5.7 - 6.4         Diabetes: >6.4         Glycemic control for adults with diabetes: <7.0   03/06/2018 01:47 PM 7.3 (H) 4.8 - 5.6 % Final    Comment:    (NOTE) Pre diabetes:          5.7%-6.4% Diabetes:              >6.4% Glycemic control  for   <7.0% adults with diabetes     CBG: Recent Labs  Lab 09/21/22 0924 09/21/22 1146 09/21/22 1624  GLUCAP 253* 269* 304*    Review of Systems:   Unable to assess pt encephalopathic   Past Medical History:  He,  has a past medical history of BPH (benign prostatic hyperplasia), Cancer (HCC), Chronic kidney disease, Diabetes mellitus without complication (HCC), HOH (hard of hearing), Hypertension, Inguinal hernia, Pain, and Prostate cancer (HCC).   Surgical History:   Past Surgical History:  Procedure Laterality Date   BACK SURGERY     Lumbar   CATARACT EXTRACTION W/PHACO Right 07/10/2017   Procedure: CATARACT EXTRACTION PHACO AND INTRAOCULAR LENS PLACEMENT (IOC);  Surgeon: Galen Manila, MD;  Location: [facility] ORS;  Service: Ophthalmology;  Laterality: Right;  [place] 00:29.2 AP% 16.5 CDE 4.83 Fluid Pack Lot # [id number]   COLONOSCOPY     EYE SURGERY Bilateral    cataract extraction   HERNIA REPAIR     INGUINAL   PROSTATE BIOPSY N/A 04/08/2020   Procedure: PROSTATE BIOPSY Addison Bailey;  Surgeon: Orson Ape, MD;  Location: [facility] ORS;  Service: Urology;  Laterality: N/A;   TOTAL KNEE ARTHROPLASTY Left 03/19/2018   Procedure: TOTAL KNEE ARTHROPLASTY;  Surgeon: Juanell Fairly, MD;  Location: [facility] ORS;  Service: Orthopedics;  Laterality: Left;     Social History:   reports that he has never smoked. He has never used smokeless tobacco. He reports that he does not drink alcohol and does not use drugs.   Family History:  His family history includes Breast cancer in his sister; Diabetes in his father.   Allergies No Known Allergies   Home Medications  Prior to Admission medications   Medication Sig Start Date End Date Taking? Authorizing Provider  amLODipine (NORVASC) 5 MG tablet Take 5 mg by mouth daily.   Yes [provider]  aspirin EC 81 MG tablet Take 81 mg by mouth daily.   Yes [provider]  cholecalciferol (VITAMIN D) 1000 units tablet Take 1,000  Units by mouth daily.   Yes [provider]  dapagliflozin propanediol (FARXIGA) 10 MG TABS tablet Take 10 mg by mouth daily.   Yes [provider]  dexamethasone (DECADRON) 4 MG tablet Take 2 tabs by mouth daily starting the day before chemo. Then take 2 tabs daily for 2 days starting day after chemo. Take with food. 06/20/22  Yes Rickard Patience, MD  Iron-Vitamin C 65-125 MG TABS Take 1 tablet by mouth  daily. 08/23/22  Yes Rickard Patience, MD  loratadine (CLARITIN) 10 MG tablet Take 10 mg by mouth once daily before chemo and at least 2 days after injection.   Yes [provider]  losartan (COZAAR) 100 MG tablet Take 100 mg by mouth daily.   Yes [provider]  lovastatin (MEVACOR) 40 MG tablet TAKE 1 TABLET BY MOUTH EVERY DAY Patient taking differently: Take 40 mg by mouth at bedtime. 04/25/22  Yes Masoud, Renda Rolls, MD  metoprolol tartrate (LOPRESSOR) 50 MG tablet TAKE 1 TABLET BY MOUTH EVERY 12 HOURS Patient taking differently: Take 50 mg by mouth 2 (two) times daily. 11/25/21  Yes Masoud, Renda Rolls, MD  Misc Natural Products (PROSTATE SUPPORT PO) Take 2 capsules by mouth daily.   Yes [provider]  NOVOLOG FLEXPEN 100 UNIT/ML FlexPen Inject into the skin. Per sliding scale 01/03/18  Yes [provider]  Omega-3 Fatty Acids (FISH OIL PO) Take 1 capsule by mouth daily.    Yes [provider]  ondansetron (ZOFRAN) 8 MG tablet Take 1 tablet (8 mg total) by mouth every 8 (eight) hours as needed for nausea or vomiting. 06/20/22  Yes Rickard Patience, MD  prochlorperazine (COMPAZINE) 10 MG tablet Take 1 tablet (10 mg total) by mouth every 6 (six) hours as needed for nausea or vomiting. 06/20/22  Yes Rickard Patience, MD  TRESIBA FLEXTOUCH 200 UNIT/ML SOPN Inject 80 Units into the muscle daily before breakfast.  06/20/17  Yes [provider]  TRULICITY 1.5 MG/0.5ML SOPN Inject into the skin. 08/10/22  Yes [provider]  vitamin B-12 (CYANOCOBALAMIN) 500 MCG tablet  Take 500 mcg by mouth daily.   Yes [provider]     Critical care time: 65 minutes     Zada Girt, AGNP  Pulmonary/Critical Care Pager [phone] (please enter 7 digits) PCCM Consult Pager [phone] (please enter 7 digits)

## 2022-09-21 NOTE — ED Triage Notes (Addendum)
Pt comes from Cancer center with c/o unresponsive and AMS. Pt was here today for two units of blood due to hgb of 5.6 and apparently in elevator had a unresponsive episode. Pt was then brought over to ED.  Pt staring off in space. Pt somewhat alert. MD at bedside  CBG checked at bedside. Pt does deny any pain.

## 2022-09-22 ENCOUNTER — Inpatient Hospital Stay: Payer: Medicare HMO

## 2022-09-22 ENCOUNTER — Ambulatory Visit: Payer: Medicare HMO

## 2022-09-22 DIAGNOSIS — C61 Malignant neoplasm of prostate: Secondary | ICD-10-CM | POA: Diagnosis not present

## 2022-09-22 DIAGNOSIS — D649 Anemia, unspecified: Secondary | ICD-10-CM | POA: Diagnosis not present

## 2022-09-22 DIAGNOSIS — D631 Anemia in chronic kidney disease: Secondary | ICD-10-CM

## 2022-09-22 DIAGNOSIS — E861 Hypovolemia: Secondary | ICD-10-CM | POA: Diagnosis not present

## 2022-09-22 DIAGNOSIS — Z7189 Other specified counseling: Secondary | ICD-10-CM

## 2022-09-22 DIAGNOSIS — R310 Gross hematuria: Secondary | ICD-10-CM

## 2022-09-22 DIAGNOSIS — N183 Chronic kidney disease, stage 3 unspecified: Secondary | ICD-10-CM

## 2022-09-22 DIAGNOSIS — N1330 Unspecified hydronephrosis: Secondary | ICD-10-CM | POA: Diagnosis not present

## 2022-09-22 LAB — BASIC METABOLIC PANEL
Anion gap: 7 (ref 5–15)
BUN: 111 mg/dL — ABNORMAL HIGH (ref 8–23)
CO2: 20 mmol/L — ABNORMAL LOW (ref 22–32)
Calcium: 8.4 mg/dL — ABNORMAL LOW (ref 8.9–10.3)
Chloride: 117 mmol/L — ABNORMAL HIGH (ref 98–111)
Creatinine, Ser: 1.76 mg/dL — ABNORMAL HIGH (ref 0.61–1.24)
GFR, Estimated: 38 mL/min — ABNORMAL LOW (ref >60)
Glucose, Bld: 134 mg/dL — ABNORMAL HIGH (ref 70–99)
Potassium: 4 mmol/L (ref 3.5–5.1)
Sodium: 144 mmol/L (ref 135–145)

## 2022-09-22 LAB — CBC
HCT: 26.9 % — ABNORMAL LOW (ref 39.0–52.0)
HCT: 23.4 % — ABNORMAL LOW (ref 39.0–52.0)
HCT: 19.3 % — ABNORMAL LOW (ref 39.0–52.0)
Hemoglobin: 9.1 g/dL — ABNORMAL LOW (ref 13.0–17.0)
Hemoglobin: 8 g/dL — ABNORMAL LOW (ref 13.0–17.0)
Hemoglobin: 6.6 g/dL — ABNORMAL LOW (ref 13.0–17.0)
MCH: 29.4 pg (ref 26.0–34.0)
MCH: 29.5 pg (ref 26.0–34.0)
MCH: 30 pg (ref 26.0–34.0)
MCHC: 33.8 g/dL (ref 30.0–36.0)
MCHC: 34.2 g/dL (ref 30.0–36.0)
MCHC: 34.2 g/dL (ref 30.0–36.0)
MCV: 86.8 fL (ref 80.0–100.0)
MCV: 86.3 fL (ref 80.0–100.0)
MCV: 87.7 fL (ref 80.0–100.0)
Platelets: 162 10*3/uL (ref 150–400)
Platelets: 155 10*3/uL (ref 150–400)
Platelets: 143 10*3/uL — ABNORMAL LOW (ref 150–400)
RBC: 3.1 MIL/uL — ABNORMAL LOW (ref 4.22–5.81)
RBC: 2.71 MIL/uL — ABNORMAL LOW (ref 4.22–5.81)
RBC: 2.2 MIL/uL — ABNORMAL LOW (ref 4.22–5.81)
RDW: 15.1 % (ref 11.5–15.5)
RDW: 15.8 % — ABNORMAL HIGH (ref 11.5–15.5)
RDW: 16 % — ABNORMAL HIGH (ref 11.5–15.5)
WBC: 16.8 10*3/uL — ABNORMAL HIGH (ref 4.0–10.5)
WBC: 20.4 10*3/uL — ABNORMAL HIGH (ref 4.0–10.5)
WBC: 19.2 10*3/uL — ABNORMAL HIGH (ref 4.0–10.5)
nRBC: 1.6 % — ABNORMAL HIGH (ref 0.0–0.2)
nRBC: 1.1 % — ABNORMAL HIGH (ref 0.0–0.2)
nRBC: 1.3 % — ABNORMAL HIGH (ref 0.0–0.2)

## 2022-09-22 LAB — BPAM RBC
ISSUE DATE / TIME: 202405161308
Unit Type and Rh: 1700

## 2022-09-22 LAB — GLUCOSE, CAPILLARY
Glucose-Capillary: 102 mg/dL — ABNORMAL HIGH (ref 70–99)
Glucose-Capillary: 115 mg/dL — ABNORMAL HIGH (ref 70–99)
Glucose-Capillary: 122 mg/dL — ABNORMAL HIGH (ref 70–99)
Glucose-Capillary: 135 mg/dL — ABNORMAL HIGH (ref 70–99)
Glucose-Capillary: 139 mg/dL — ABNORMAL HIGH (ref 70–99)
Glucose-Capillary: 171 mg/dL — ABNORMAL HIGH (ref 70–99)
Glucose-Capillary: 236 mg/dL — ABNORMAL HIGH (ref 70–99)
Glucose-Capillary: 268 mg/dL — ABNORMAL HIGH (ref 70–99)
Glucose-Capillary: 75 mg/dL (ref 70–99)
Glucose-Capillary: 90 mg/dL (ref 70–99)
Glucose-Capillary: 96 mg/dL (ref 70–99)

## 2022-09-22 LAB — PHOSPHORUS: Phosphorus: 3.3 mg/dL (ref 2.5–4.6)

## 2022-09-22 LAB — HEPATIC FUNCTION PANEL
ALT: 23 U/L (ref 0–44)
AST: 20 U/L (ref 15–41)
Albumin: 2.9 g/dL — ABNORMAL LOW (ref 3.5–5.0)
Alkaline Phosphatase: 33 U/L — ABNORMAL LOW (ref 38–126)
Bilirubin, Direct: <0.1 mg/dL (ref 0.0–0.2)
Total Bilirubin: 0.6 mg/dL (ref 0.3–1.2)
Total Protein: 5 g/dL — ABNORMAL LOW (ref 6.5–8.1)

## 2022-09-22 LAB — TYPE AND SCREEN: Unit division: 0

## 2022-09-22 LAB — HEMOGLOBIN AND HEMATOCRIT, BLOOD
HCT: 19 % — ABNORMAL LOW (ref 39.0–52.0)
Hemoglobin: 6.4 g/dL — ABNORMAL LOW (ref 13.0–17.0)

## 2022-09-22 LAB — PREPARE RBC (CROSSMATCH)

## 2022-09-22 LAB — MAGNESIUM: Magnesium: 2.4 mg/dL (ref 1.7–2.4)

## 2022-09-22 LAB — POTASSIUM: Potassium: 4.5 mmol/L (ref 3.5–5.1)

## 2022-09-22 LAB — VITAMIN B12: Vitamin B-12: 4945 pg/mL — ABNORMAL HIGH (ref 180–914)

## 2022-09-22 MED ORDER — INSULIN GLARGINE-YFGN 100 UNIT/ML ~~LOC~~ SOLN
30.0000 [IU] | Freq: Two times a day (BID) | SUBCUTANEOUS | Status: DC
  Administered 2022-09-22 – 2022-09-23 (×3): 30 [IU] via SUBCUTANEOUS
  Filled 2022-09-22 (×4): qty 0.3

## 2022-09-22 MED ORDER — SODIUM CHLORIDE 0.9 % IV SOLN
200.0000 mg | Freq: Once | INTRAVENOUS | Status: AC
  Administered 2022-09-23: 200 mg via INTRAVENOUS
  Filled 2022-09-22: qty 200

## 2022-09-22 MED ORDER — INSULIN ASPART 100 UNIT/ML IJ SOLN
0.0000 [IU] | INTRAMUSCULAR | Status: DC
  Administered 2022-09-22 – 2022-09-23 (×2): 2 [IU] via SUBCUTANEOUS
  Administered 2022-09-23: 3 [IU] via SUBCUTANEOUS
  Administered 2022-09-24 – 2022-09-25 (×2): 2 [IU] via SUBCUTANEOUS
  Filled 2022-09-22 (×4): qty 1

## 2022-09-22 MED ORDER — SODIUM CHLORIDE 0.9% IV SOLUTION: Freq: Once | INTRAVENOUS | Status: AC

## 2022-09-22 NOTE — Progress Notes (Addendum)
[facility] Gastroenterology Inpatient Progress Note  Subjective: Patient seen for follow up of symptomatic anemia. He reports a good BM yesterday.  He has had no overt GI blood loss over night. The nursing staff noted intermittent  Afib with tachy rhythm. He denies CP or SOB. He has rising leukocytosis to 16.8, Hgb improved 9.1 after transfusions. He denies cough, abdominal pain or pain anywhere. Nursing noticed hematuria after foley catheter placed.   Objective: Vital signs in last 24 hours: Temp:  [97.5 F (36.4 C)-99.2 F (37.3 C)] 98.5 F (36.9 C) (05/17 0400) Pulse Rate:  [40-146] 40 (05/17 0700) Resp:  [14-26] 18 (05/17 0700) BP: (54-154)/(30-112) 100/65 (05/17 0700) SpO2:  [97 %-100 %] 100 % (05/17 0700) Weight:  [105 kg] 105 kg (05/16 1107) Blood pressure 100/65, pulse (!) 40, temperature 98.5 F (36.9 C), temperature source Oral, resp. rate 18, height 6\' 1"  (1.854 m), weight 105 kg, SpO2 100 %.    Intake/Output from previous day: 05/16 0701 - 05/17 0700 In: 2866.1 [I.V.:1604.1; Blood:1262] Out: 5275 [Urine:5275]  Intake/Output this shift: No intake/output data recorded.   Gen: NAD. Appears comfortable. Chest: CTA, no wheezes. CV: Irregular rate and tachy S1, S2.   Abd: soft, nt, nd. BS+ Ext: pos edema.  Neuro: Sleeping - arouses easily, follows commands, answers questions, remains lethargic  Lab Results: Results for orders placed or performed during the hospital encounter of 09/21/22 (from the past 24 hour(s))  CBG monitoring, ED     Status: Abnormal   Collection Time: 09/21/22  9:24 AM  Result Value Ref Range   Glucose-Capillary 253 (H) 70 - 99 mg/dL  CBC     Status: Abnormal   Collection Time: 09/21/22  9:31 AM  Result Value Ref Range   WBC 12.3 (H) 4.0 - 10.5 K/uL   RBC 1.31 (L) 4.22 - 5.81 MIL/uL   Hemoglobin 3.9 (LL) 13.0 - 17.0 g/dL   HCT 16.1 (LL) 09.6 - 52.0 %   MCV 97.7 80.0 - 100.0 fL   MCH 29.8 26.0 - 34.0 pg   MCHC 30.5 30.0 - 36.0 g/dL    RDW 04.5 (H) 40.9 - 15.5 %   Platelets 227 150 - 400 K/uL   nRBC 0.5 (H) 0.0 - 0.2 %  Comprehensive metabolic panel     Status: Abnormal   Collection Time: 09/21/22  9:31 AM  Result Value Ref Range   Sodium 135 135 - 145 mmol/L   Potassium 4.8 3.5 - 5.1 mmol/L   Chloride 106 98 - 111 mmol/L   CO2 14 (L) 22 - 32 mmol/L   Glucose, Bld 285 (H) 70 - 99 mg/dL   BUN 811 (H) 8 - 23 mg/dL   Creatinine, Ser 9.14 (H) 0.61 - 1.24 mg/dL   Calcium 8.3 (L) 8.9 - 10.3 mg/dL   Total Protein 4.8 (L) 6.5 - 8.1 g/dL   Albumin 2.6 (L) 3.5 - 5.0 g/dL   AST 37 15 - 41 U/L   ALT 27 0 - 44 U/L   Alkaline Phosphatase 36 (L) 38 - 126 U/L   Total Bilirubin 0.6 0.3 - 1.2 mg/dL   GFR, Estimated 31 (L) >60 mL/min   Anion gap 15 5 - 15  Type and screen [facility]     Status: None (Preliminary result)   Collection Time: 09/21/22  9:31 AM  Result Value Ref Range   ABO/RH(D) B POS    Antibody Screen NEG    Sample Expiration 09/24/2022,2359  Unit Number [id number]    Blood Component Type RED CELLS,LR    Unit division 00    Status of Unit ISSUED    Transfusion Status OK TO TRANSFUSE    Crossmatch Result Compatible    Unit Number [id number]    Blood Component Type RED CELLS,LR    Unit division 00    Status of Unit ISSUED    Transfusion Status OK TO TRANSFUSE    Crossmatch Result Compatible    Unit Number [id number]    Blood Component Type RED CELLS,LR    Unit division 00    Status of Unit ISSUED    Transfusion Status OK TO TRANSFUSE    Crossmatch Result Compatible    Unit Number [id number]    Blood Component Type RED CELLS,LR    Unit division 00    Status of Unit ISSUED    Transfusion Status OK TO TRANSFUSE    Crossmatch Result      Compatible Performed at [facility], [address] [place], [place] [address]   Urinalysis, Complete w Microscopic -Urine, Clean Catch     Status: Abnormal   Collection Time: 09/21/22  9:31 AM  Result Value Ref Range    Color, Urine RED (A) YELLOW   APPearance HAZY (A) CLEAR   Specific Gravity, Urine 1.016 1.005 - 1.030   pH  5.0 - 8.0    TEST NOT REPORTED DUE TO COLOR INTERFERENCE OF URINE PIGMENT   Glucose, UA (A) NEGATIVE mg/dL    TEST NOT REPORTED DUE TO COLOR INTERFERENCE OF URINE PIGMENT   Hgb urine dipstick (A) NEGATIVE    TEST NOT REPORTED DUE TO COLOR INTERFERENCE OF URINE PIGMENT   Bilirubin Urine (A) NEGATIVE    TEST NOT REPORTED DUE TO COLOR INTERFERENCE OF URINE PIGMENT   Ketones, ur (A) NEGATIVE mg/dL    TEST NOT REPORTED DUE TO COLOR INTERFERENCE OF URINE PIGMENT   Protein, ur (A) NEGATIVE mg/dL    TEST NOT REPORTED DUE TO COLOR INTERFERENCE OF URINE PIGMENT   Nitrite (A) NEGATIVE    TEST NOT REPORTED DUE TO COLOR INTERFERENCE OF URINE PIGMENT   Leukocytes,Ua (A) NEGATIVE    TEST NOT REPORTED DUE TO COLOR INTERFERENCE OF URINE PIGMENT   RBC / HPF >50 0 - 5 RBC/hpf   WBC, UA 21-50 0 - 5 WBC/hpf   Bacteria, UA RARE (A) NONE SEEN   Squamous Epithelial / HPF 6-10 0 - 5 /HPF   Mucus PRESENT   Prepare RBC (crossmatch)     Status: None   Collection Time: 09/21/22  9:43 AM  Result Value Ref Range   Order Confirmation      ORDER PROCESSED BY BLOOD BANK Performed at [facility], [address] [place], [place] [address]   Prepare RBC (crossmatch)     Status: None   Collection Time: 09/21/22 10:12 AM  Result Value Ref Range   Order Confirmation      DUPLICATE REQUEST Performed at [facility], [address] [place], [place] [address]   Glucose, capillary     Status: Abnormal   Collection Time: 09/21/22 11:46 AM  Result Value Ref Range   Glucose-Capillary 269 (H) 70 - 99 mg/dL  MRSA Next Gen by PCR, Nasal     Status: None   Collection Time: 09/21/22 12:50 PM   Specimen: Nasal Mucosa; Nasal Swab  Result Value Ref Range   MRSA by PCR Next Gen NOT DETECTED NOT DETECTED  Glucose, capillary  Status: Abnormal   Collection Time: 09/21/22  4:24 PM  Result Value  Ref Range   Glucose-Capillary 304 (H) 70 - 99 mg/dL  Protime-INR     Status: Abnormal   Collection Time: 09/21/22  4:40 PM  Result Value Ref Range   Prothrombin Time 17.8 (H) 11.4 - 15.2 seconds   INR 1.4 (H) 0.8 - 1.2  APTT     Status: None   Collection Time: 09/21/22  4:40 PM  Result Value Ref Range   aPTT 28 24 - 36 seconds  CBC     Status: Abnormal   Collection Time: 09/21/22  4:40 PM  Result Value Ref Range   WBC 11.9 (H) 4.0 - 10.5 K/uL   RBC 1.80 (L) 4.22 - 5.81 MIL/uL   Hemoglobin 5.2 (L) 13.0 - 17.0 g/dL   HCT 16.1 (L) 09.6 - 04.5 %   MCV 88.9 80.0 - 100.0 fL   MCH 28.9 26.0 - 34.0 pg   MCHC 32.5 30.0 - 36.0 g/dL   RDW 40.9 (H) 81.1 - 91.4 %   Platelets 178 150 - 400 K/uL   nRBC 0.8 (H) 0.0 - 0.2 %  Brain natriuretic peptide     Status: None   Collection Time: 09/21/22  4:40 PM  Result Value Ref Range   B Natriuretic Peptide 80.3 0.0 - 100.0 pg/mL  Retic Panel     Status: Abnormal   Collection Time: 09/21/22  4:40 PM  Result Value Ref Range   Retic Ct Pct 6.9 (H) 0.4 - 3.1 %   RBC. 1.81 (L) 4.22 - 5.81 MIL/uL   Retic Count, Absolute 124.0 19.0 - 186.0 K/uL   Immature Retic Fract 39.2 (H) 2.3 - 15.9 %   Reticulocyte Hemoglobin 30.0 >27.9 pg  Prepare RBC (crossmatch)     Status: None   Collection Time: 09/21/22  5:14 PM  Result Value Ref Range   Order Confirmation      ORDER PROCESSED BY BLOOD BANK Performed at [facility], [address] [place], [place] [address]   Glucose, capillary     Status: Abnormal   Collection Time: 09/21/22  7:44 PM  Result Value Ref Range   Glucose-Capillary 312 (H) 70 - 99 mg/dL  Glucose, capillary     Status: Abnormal   Collection Time: 09/21/22  9:10 PM  Result Value Ref Range   Glucose-Capillary 336 (H) 70 - 99 mg/dL  CBC     Status: Abnormal   Collection Time: 09/21/22 11:00 PM  Result Value Ref Range   WBC 14.4 (H) 4.0 - 10.5 K/uL   RBC 3.01 (L) 4.22 - 5.81 MIL/uL   Hemoglobin 8.8 (L) 13.0 - 17.0 g/dL   HCT  62.1 (L) 30.8 - 52.0 %   MCV 88.7 80.0 - 100.0 fL   MCH 29.2 26.0 - 34.0 pg   MCHC 33.0 30.0 - 36.0 g/dL   RDW 65.7 84.6 - 96.2 %   Platelets 146 (L) 150 - 400 K/uL   nRBC 0.8 (H) 0.0 - 0.2 %  Vitamin B12     Status: Abnormal   Collection Time: 09/21/22 11:00 PM  Result Value Ref Range   Vitamin B-12 4,945 (H) 180 - 914 pg/mL  Magnesium     Status: None   Collection Time: 09/21/22 11:00 PM  Result Value Ref Range   Magnesium 2.2 1.7 - 2.4 mg/dL  Phosphorus     Status: None   Collection Time: 09/21/22 11:00 PM  Result Value Ref  Range   Phosphorus 4.1 2.5 - 4.6 mg/dL  Potassium     Status: None   Collection Time: 09/21/22 11:00 PM  Result Value Ref Range   Potassium 4.5 3.5 - 5.1 mmol/L  Blood gas, venous     Status: Abnormal   Collection Time: 09/21/22 11:08 PM  Result Value Ref Range   pH, Ven 7.31 7.25 - 7.43   pCO2, Ven 38 (L) 44 - 60 mmHg   pO2, Ven 33 32 - 45 mmHg   Bicarbonate 19.1 (L) 20.0 - 28.0 mmol/L   Acid-base deficit 6.6 (H) 0.0 - 2.0 mmol/L   O2 Saturation 46.9 %   Patient temperature 37.0    Collection site VEIN   Glucose, capillary     Status: Abnormal   Collection Time: 09/21/22 11:29 PM  Result Value Ref Range   Glucose-Capillary 300 (H) 70 - 99 mg/dL  Glucose, capillary     Status: Abnormal   Collection Time: 09/22/22 12:19 AM  Result Value Ref Range   Glucose-Capillary 268 (H) 70 - 99 mg/dL  Glucose, capillary     Status: Abnormal   Collection Time: 09/22/22  1:22 AM  Result Value Ref Range   Glucose-Capillary 236 (H) 70 - 99 mg/dL  Glucose, capillary     Status: Abnormal   Collection Time: 09/22/22  2:29 AM  Result Value Ref Range   Glucose-Capillary 171 (H) 70 - 99 mg/dL  Glucose, capillary     Status: Abnormal   Collection Time: 09/22/22  3:30 AM  Result Value Ref Range   Glucose-Capillary 115 (H) 70 - 99 mg/dL  Basic metabolic panel     Status: Abnormal   Collection Time: 09/22/22  4:09 AM  Result Value Ref Range   Sodium 144 135 - 145  mmol/L   Potassium 4.0 3.5 - 5.1 mmol/L   Chloride 117 (H) 98 - 111 mmol/L   CO2 20 (L) 22 - 32 mmol/L   Glucose, Bld 134 (H) 70 - 99 mg/dL   BUN 433 (H) 8 - 23 mg/dL   Creatinine, Ser 2.95 (H) 0.61 - 1.24 mg/dL   Calcium 8.4 (L) 8.9 - 10.3 mg/dL   GFR, Estimated 38 (L) >60 mL/min   Anion gap 7 5 - 15  Magnesium     Status: None   Collection Time: 09/22/22  4:09 AM  Result Value Ref Range   Magnesium 2.4 1.7 - 2.4 mg/dL  Phosphorus     Status: None   Collection Time: 09/22/22  4:09 AM  Result Value Ref Range   Phosphorus 3.3 2.5 - 4.6 mg/dL  Hepatic function panel     Status: Abnormal   Collection Time: 09/22/22  4:09 AM  Result Value Ref Range   Total Protein 5.0 (L) 6.5 - 8.1 g/dL   Albumin 2.9 (L) 3.5 - 5.0 g/dL   AST 20 15 - 41 U/L   ALT 23 0 - 44 U/L   Alkaline Phosphatase 33 (L) 38 - 126 U/L   Total Bilirubin 0.6 0.3 - 1.2 mg/dL   Bilirubin, Direct <1.8 0.0 - 0.2 mg/dL   Indirect Bilirubin NOT CALCULATED 0.3 - 0.9 mg/dL  CBC     Status: Abnormal   Collection Time: 09/22/22  4:10 AM  Result Value Ref Range   WBC 16.8 (H) 4.0 - 10.5 K/uL   RBC 3.10 (L) 4.22 - 5.81 MIL/uL   Hemoglobin 9.1 (L) 13.0 - 17.0 g/dL   HCT 84.1 (L) 66.0 - 63.0 %  MCV 86.8 80.0 - 100.0 fL   MCH 29.4 26.0 - 34.0 pg   MCHC 33.8 30.0 - 36.0 g/dL   RDW 82.9 56.2 - 13.0 %   Platelets 162 150 - 400 K/uL   nRBC 1.6 (H) 0.0 - 0.2 %  Glucose, capillary     Status: None   Collection Time: 09/22/22  4:38 AM  Result Value Ref Range   Glucose-Capillary 90 70 - 99 mg/dL  Glucose, capillary     Status: None   Collection Time: 09/22/22  5:52 AM  Result Value Ref Range   Glucose-Capillary 96 70 - 99 mg/dL  Glucose, capillary     Status: None   Collection Time: 09/22/22  7:28 AM  Result Value Ref Range   Glucose-Capillary 75 70 - 99 mg/dL     Recent Labs    86/57/84 1640 09/21/22 2300 09/22/22 0410  WBC 11.9* 14.4* 16.8*  HGB 5.2* 8.8* 9.1*  HCT 16.0* 26.7* 26.9*  PLT 178 146* 162    BMET Recent Labs    09/20/22 0849 09/21/22 0931 09/21/22 2300 09/22/22 0409  NA 138 135  --  144  K 4.4 4.8 4.5 4.0  CL 106 106  --  117*  CO2 17* 14*  --  20*  GLUCOSE 258* 285*  --  134*  BUN 83* 103*  --  111*  CREATININE 1.94* 2.12*  --  1.76*  CALCIUM 8.6* 8.3*  --  8.4*   LFT Recent Labs    09/22/22 0409  PROT 5.0*  ALBUMIN 2.9*  AST 20  ALT 23  ALKPHOS 33*  BILITOT 0.6  BILIDIR <0.1  IBILI NOT CALCULATED   PT/INR Recent Labs    09/21/22 1640  LABPROT 17.8*  INR 1.4*   Hepatitis Panel No results for input(s): "HEPBSAG", "HCVAB", "HEPAIGM", "HEPBIGM" in the last 72 hours. C-Diff No results for input(s): "CDIFFTOX" in the last 72 hours. No results for input(s): "CDIFFPCR" in the last 72 hours.   Studies/Results: CT HEAD WO CONTRAST ( )  Result Date: 09/21/2022 CLINICAL DATA:  Altered mental status EXAM: CT HEAD WITHOUT CONTRAST TECHNIQUE: Contiguous axial images were obtained from the base of the skull through the vertex without intravenous contrast. RADIATION DOSE REDUCTION: This exam was performed according to the departmental dose-optimization program which includes automated exposure control, adjustment of the mA and/or kV according to patient size and/or use of iterative reconstruction technique. COMPARISON:  CT head 07/06/15 FINDINGS: Brain: No hemorrhage. No hydrocephalus. No extra-axial fluid collection. No CT evidence of an acute cortical infarct. No mass effect. No mass lesion. There is mild overall chronic microvascular ischemic change. Vascular: No hyperdense vessel or unexpected calcification. Skull: Normal. Negative for fracture or focal lesion. Sinuses/Orbits: No middle ear or mastoid effusion. Paranasal sinuses are clear. Orbits are unremarkable. Other: None. IMPRESSION: No acute intracranial process. No specific etiology for altered mental status identified. Electronically Signed   By: Lorenza Cambridge M.D.   On: 09/21/2022 10:12   CT Abdomen  Pelvis Wo Contrast  Result Date: 09/20/2022 CLINICAL DATA:  Symptomatic anemia. Clinical suspicion for retroperitoneal hemorrhage. EXAM: CT ABDOMEN AND PELVIS WITHOUT CONTRAST TECHNIQUE: Multidetector CT imaging of the abdomen and pelvis was performed following the standard protocol without IV contrast. RADIATION DOSE REDUCTION: This exam was performed according to the departmental dose-optimization program which includes automated exposure control, adjustment of the mA and/or kV according to patient size and/or use of iterative reconstruction technique. COMPARISON:  05/13/2020 FINDINGS: Lower chest: No acute findings.  Hepatobiliary: No mass visualized on this unenhanced exam. Gallbladder is unremarkable. No evidence of biliary ductal dilatation. Pancreas: No mass or inflammatory process visualized on this unenhanced exam. Spleen:  Within normal limits in size. Adrenals/Urinary tract: No evidence of urolithiasis. No suspicious renal masses identified. Moderate to severe bilateral hydroureteronephrosis is seen to the level of the bladder, which is new since previous study. No ureteral calculi or other obstructing etiology visualized. Distended urinary bladder noted with mild diffuse wall thickening. Stomach/Bowel: No evidence of obstruction, inflammatory process, or abnormal fluid collections. Normal appendix visualized. Moderate colonic stool burden noted. Vascular/Lymphatic: No pathologically enlarged lymph nodes identified. No evidence of abdominal aortic aneurysm. Aortic atherosclerotic calcification incidentally noted. Reproductive: Moderately enlarged prostate gland, without significant change. Other:  None.  No evidence of retroperitoneal hemorrhage. Musculoskeletal:  No suspicious bone lesions identified. IMPRESSION: No evidence of retroperitoneal hemorrhage. New moderate to severe bilateral hydroureteronephrosis to the level of the bladder. No ureteral calculi or other obstructing etiology visualized. This  is likely due to vesicoureteral reflux given distended urinary bladder. Distended urinary bladder with mild diffuse wall thickening, presumably due to chronic bladder outlet obstruction. Moderately enlarged prostate gland, without significant change. Moderate colonic stool burden; recommend correlation for symptoms or signs of constipation. Electronically Signed   By: Danae Orleans M.D.   On: 09/20/2022 12:49    Scheduled Inpatient Medications:    sodium chloride   Intravenous Once   Chlorhexidine Gluconate Cloth  6 each Topical Daily   cholecalciferol  1,000 Units Oral Daily   cyanocobalamin  500 mcg Oral Daily   insulin aspart  0-15 Units Subcutaneous Q4H   insulin glargine-yfgn  30 Units Subcutaneous BID   midodrine  10 mg Oral TID WC   omega-3 acid ethyl esters  1 g Oral Daily   pantoprazole (PROTONIX) IV  40 mg Intravenous Q12H   pravastatin  40 mg Oral q1800    Continuous Inpatient Infusions:    sodium chloride     sodium chloride     sodium chloride 75 mL/hr at 09/22/22 0602   norepinephrine (LEVOPHED) Adult infusion Stopped (09/21/22 1706)    PRN Inpatient Medications:  acetaminophen, metoprolol tartrate, ondansetron (ZOFRAN) IV   Assessment:  Severe acute on chronic normocytic anemia: Symptomatic anemia Leukocytosis Prostate cancer CKD/AKI   Plan:  - Hold on the EGD as patient is demonstrating no active GI bleeding signs. He has had no N/V or any abdominal pain. Reports a good BM yesterday. His Hgb has improved. He has tachyarrhythmia and is still encephalopathic secondary to renal disease. - Diet may be advanced per primary team. Monitor H&H. Transfusion and resuscitation as per primary team Avoid frequent lab draws to prevent lab induced anemia Supportive care as per primary team Maintain two sites IV access Avoid nsaids Monitor for GIB. Discussed with Dr. Timothy Lasso in collaboration of care.  Rowan Blase, ANP @[facility] @, 8:26 AM

## 2022-09-22 NOTE — Progress Notes (Signed)
NAME:  NALIN MUNDINE, MRN:  [id number], DOB:  1941/01/18, LOS: 1 ADMISSION DATE:  09/21/2022, CONSULTATION DATE: 09/21/2022 REFERRING MD: Dr. Clyde Lundborg, CHIEF COMPLAINT: Weakness and LOC   History of Present Illness:  This is an 82 yo male with a PMH of Prostate Cancer (currently receiving chemotherapy), HTN, and CKD who presented to [facility] ER on 05/16 from the infusion center after a brief period of unresponsiveness.  The pt was scheduled to received 2 units of pRBC's for an hgb of 5.7 on 05/15 likely secondary to bone marrow suppression as well as anemia due to CKD per oncology progress notes.  CT Abd Pelvis ordered by outpatient oncologist due to drop in hgb on 09/20/22 which was negative for a bleed or retroperitoneal hemorrhage, but concerning for new moderate to severe bilateral hydroureteronephrosis to the level of the bladder.  Today at the infusion center nurse reported the pt became lethargic with his eyes open and unable to answer questions or follow commands which lasted for a minute.  Pt immediately wheeled to the ER for evaluation.   ED Course  Per ER notes upon arrival to the ER pt appeared weak, but able to answer questions and follow basic commands.  Lab results were significant for: BUN 103/creatinine 2.12/albumin 2.6/wbc 12.3/hgb 3.9/hct 12.8/PT 17.8/INR 1.4/glucose 285.  Pts initial vital signs were stable and pt did not require vasopressors.  Pts family denied signs of acute GI bleed but the pt reported "dark green stools." He received 2 units of pRBC's and was subsequently admitted to the stepdown unit per hospitalist team for additional workup and treatment.  See detailed hospital course below under significant events.    CT Head: No acute intracranial process. No specific etiology for altered mental status identified.  CT Abd Pelvis: No evidence of retroperitoneal hemorrhage. New moderate to severe bilateral hydroureteronephrosis to the level of the bladder. No ureteral calculi or  other obstructing etiology visualized. This is likely due to vesicoureteral reflux given distended urinary bladder. Distended urinary bladder with mild diffuse wall thickening, presumably due to chronic bladder outlet obstruction. Moderately enlarged prostate gland, without significant change. Moderate colonic stool burden; recommend correlation for symptoms or signs of constipation.   Pertinent  Medical History  BPH Prostate Cancer  Stage III CKD Type II Diabetes Mellitus  HOH HTN  Inguinal Hernia   Significant Hospital Events: Including procedures, antibiotic start and stop dates in addition to other pertinent events   05/16: Pt admitted with new moderate to severe bilateral hydroureteronephrosis and hypotension secondary to severe acute on chronic normocytic anemia requiring blood transfusions and levophed gtt.  PCCM consulted  5/17 - patient improved with no Critical care needs identified this am. PCCM will sign off.  Vitals are markedly improved off infusions.     Objective   Blood pressure 100/65, pulse (!) 40, temperature 98.5 F (36.9 C), temperature source Oral, resp. rate 18, height 6\' 1"  (1.854 m), weight 105 kg, SpO2 100 %.        Intake/Output Summary (Last 24 hours) at 09/22/2022 0834 Last data filed at 09/22/2022 0602 Gross per 24 hour  Intake 2866.06 ml  Output 5275 ml  Net -2408.94 ml    Filed Weights   09/21/22 1107  Weight: 105 kg    Examination: General: Acute on chronically-ill appearing male, NAD RA  HENT: supple, no JVD  Lungs: Clear throughout, even, non labored  Cardiovascular: Sinus tachycardia, s1s2, no m/r/g, 2+ radial/1+ distal pulses, 2+ bilateral lower extremity pitting edema  Abdomen: +BS x4, obese, soft, non tender, non distended  Extremities: Normal bulk and tone Neuro: Lethargic but oriented, following some commands, PERRL  GU: Indwelling foley catheter with hematuria   Resolved Hospital Problem list     Assessment & Plan:    #Hypotension secondary to severe acute on chronic normocytic anemia due to possible bone marrow suppression due to chemotherapy  #Syncope secondary to hypotension  Hx: HTN  - Continuous telemetry monitoring  - Scheduled midodrine; prn blood products; and/or levophed gtt to maintain map >65 - Hold outpatient antihypertensives and aspirin  - Fall precautions   #Acute kidney injury superimposed on stage IIIb CKD  #Urinary retention with bilateral hydroureteronephrosis  - Trend BMP  - Replace electrolytes as indicated  - Monitor UOP - Avoid nephrotoxic medications when able  - Urology consulted appreciate input: recommending renal US in 72hrs to reassess hydronephrosis in the setting of renal decompression; once bp stabilizes consider starting an alpha-blocker to increase inability to void in the long-term; and keep indwelling foley catheter in place for now with plans for outpatient follow-up in the urology clinic in 2 to 3 weeks to discuss bladder management options   #Symptomatic severe acute on chronic normocytic anemia due to bone marrow suppression due to chemotherapy  - CBC q6hrs  - Transfuse for hgb <7 and/or active sighs of bleeding  - IV iron x1 dose per oncology recommendations  - PPI q12hrs  - Vitamin B12 and retic panel pending  - GI consulted appreciate input: EGD scheduled for 05/17 - Can consider bleeding scan if pt shows signs of active bleeding   #Metastatic prostate cancer, castrate resistance, progressed while on Xtandi  - Oncology consulted appreciate input: discontinuing chemotherapy due to recurrent severe anemia   #Type II diabetes mellitus  - SSI  - CBG's q4hrs   #Acute metabolic encephalopathy  CT Head 09/21/22: negative for acute intracranial process  - Correct metabolic derangements and if mentation does not improve following correction will need MRI Brain  - Avoid sedating medications when able   Best Practice (right click and "Reselect all SmartList  Selections" daily)   Diet/type: clear liquids DVT prophylaxis: SCD GI prophylaxis: PPI Lines: N/A Foley:  Yes, and it is still needed Code Status:  full code Last date of multidisciplinary goals of care discussion [N/A]  Labs   CBC: Recent Labs  Lab 09/20/22 0849 09/21/22 0931 09/21/22 1640 09/21/22 2300 09/22/22 0410  WBC 11.2* 12.3* 11.9* 14.4* 16.8*  NEUTROABS 10.0*  --   --   --   --   HGB 5.7* 3.9* 5.2* 8.8* 9.1*  HCT 18.5* 12.8* 16.0* 26.7* 26.9*  MCV 93.0 97.7 88.9 88.7 86.8  PLT 311 227 178 146* 162     Basic Metabolic Panel: Recent Labs  Lab 09/20/22 0849 09/21/22 0931 09/21/22 2300 09/22/22 0409  NA 138 135  --  144  K 4.4 4.8 4.5 4.0  CL 106 106  --  117*  CO2 17* 14*  --  20*  GLUCOSE 258* 285*  --  134*  BUN 83* 103*  --  111*  CREATININE 1.94* 2.12*  --  1.76*  CALCIUM 8.6* 8.3*  --  8.4*  MG  --   --  2.2 2.4  PHOS  --   --  4.1 3.3    GFR: Estimated Creatinine Clearance: 41.9 mL/min (A) (by C-G formula based on SCr of 1.76 mg/dL (H)). Recent Labs  Lab 09/21/22 0931 09/21/22 1640 09/21/22 2300 09/22/22 0410  WBC 12.3* 11.9* 14.4* 16.8*     Liver Function Tests: Recent Labs  Lab 09/20/22 0849 09/21/22 0931 09/22/22 0409  AST 34 37 20  ALT 16 27 23   ALKPHOS 36* 36* 33*  BILITOT 0.3 0.6 0.6  PROT 5.7* 4.8* 5.0*  ALBUMIN 3.1* 2.6* 2.9*    No results for input(s): "LIPASE", "AMYLASE" in the last 168 hours. No results for input(s): "AMMONIA" in the last 168 hours.  ABG    Component Value Date/Time   HCO3 19.1 (L) 09/21/2022 2308   ACIDBASEDEF 6.6 (H) 09/21/2022 2308   O2SAT 46.9 09/21/2022 2308     Coagulation Profile: Recent Labs  Lab 09/21/22 1640  INR 1.4*     Cardiac Enzymes: No results for input(s): "CKTOTAL", "CKMB", "CKMBINDEX", "TROPONINI" in the last 168 hours.  HbA1C: HbA1c POC (<> result, manual entry)  Date/Time Value Ref Range Status  02/23/2021 09:11 AM 5.6 4.0 - 5.6 % Final   Hgb A1c MFr Bld   Date/Time Value Ref Range Status  07/31/2022 04:23 PM 6.8 (H) 4.8 - 5.6 % Final    Comment:    (NOTE)         Prediabetes: 5.7 - 6.4         Diabetes: >6.4         Glycemic control for adults with diabetes: <7.0   03/06/2018 01:47 PM 7.3 (H) 4.8 - 5.6 % Final    Comment:    (NOTE) Pre diabetes:          5.7%-6.4% Diabetes:              >6.4% Glycemic control for   <7.0% adults with diabetes     CBG: Recent Labs  Lab 09/22/22 0229 09/22/22 0330 09/22/22 0438 09/22/22 0552 09/22/22 0728  GLUCAP 171* 115* 90 96 75     Review of Systems:   Unable to assess pt encephalopathic   Past Medical History:  He,  has a past medical history of BPH (benign prostatic hyperplasia), Cancer (HCC), Chronic kidney disease, Diabetes mellitus without complication (HCC), HOH (hard of hearing), Hypertension, Inguinal hernia, Pain, and Prostate cancer (HCC).   Surgical History:   Past Surgical History:  Procedure Laterality Date   BACK SURGERY     Lumbar   CATARACT EXTRACTION W/PHACO Right 07/10/2017   Procedure: CATARACT EXTRACTION PHACO AND INTRAOCULAR LENS PLACEMENT (IOC);  Surgeon: Galen Manila, MD;  Location: [facility] ORS;  Service: Ophthalmology;  Laterality: Right;  [place] 00:29.2 AP% 16.5 CDE 4.83 Fluid Pack Lot # [id number]   COLONOSCOPY     EYE SURGERY Bilateral    cataract extraction   HERNIA REPAIR     INGUINAL   PROSTATE BIOPSY N/A 04/08/2020   Procedure: PROSTATE BIOPSY Addison Bailey;  Surgeon: Orson Ape, MD;  Location: [facility] ORS;  Service: Urology;  Laterality: N/A;   TOTAL KNEE ARTHROPLASTY Left 03/19/2018   Procedure: TOTAL KNEE ARTHROPLASTY;  Surgeon: Juanell Fairly, MD;  Location: [facility] ORS;  Service: Orthopedics;  Laterality: Left;     Social History:   reports that he has never smoked. He has never used smokeless tobacco. He reports that he does not drink alcohol and does not use drugs.   Family History:  His family history includes Breast cancer in his sister;  Diabetes in his father.   Allergies No Known Allergies   Home Medications  Prior to Admission medications   Medication Sig Start Date End Date Taking? Authorizing Provider  amLODipine (NORVASC) 5 MG tablet Take 5  mg by mouth daily.   Yes [provider]  aspirin EC 81 MG tablet Take 81 mg by mouth daily.   Yes [provider]  cholecalciferol (VITAMIN D) 1000 units tablet Take 1,000 Units by mouth daily.   Yes [provider]  dapagliflozin propanediol (FARXIGA) 10 MG TABS tablet Take 10 mg by mouth daily.   Yes [provider]  dexamethasone (DECADRON) 4 MG tablet Take 2 tabs by mouth daily starting the day before chemo. Then take 2 tabs daily for 2 days starting day after chemo. Take with food. 06/20/22  Yes Rickard Patience, MD  Iron-Vitamin C 65-125 MG TABS Take 1 tablet by mouth daily. 08/23/22  Yes Rickard Patience, MD  loratadine (CLARITIN) 10 MG tablet Take 10 mg by mouth once daily before chemo and at least 2 days after injection.   Yes [provider]  losartan (COZAAR) 100 MG tablet Take 100 mg by mouth daily.   Yes [provider]  lovastatin (MEVACOR) 40 MG tablet TAKE 1 TABLET BY MOUTH EVERY DAY Patient taking differently: Take 40 mg by mouth at bedtime. 04/25/22  Yes Masoud, Renda Rolls, MD  metoprolol tartrate (LOPRESSOR) 50 MG tablet TAKE 1 TABLET BY MOUTH EVERY 12 HOURS Patient taking differently: Take 50 mg by mouth 2 (two) times daily. 11/25/21  Yes Masoud, Renda Rolls, MD  Misc Natural Products (PROSTATE SUPPORT PO) Take 2 capsules by mouth daily.   Yes [provider]  NOVOLOG FLEXPEN 100 UNIT/ML FlexPen Inject into the skin. Per sliding scale 01/03/18  Yes [provider]  Omega-3 Fatty Acids (FISH OIL PO) Take 1 capsule by mouth daily.    Yes [provider]  ondansetron (ZOFRAN) 8 MG tablet Take 1 tablet (8 mg total) by mouth every 8 (eight) hours as needed for nausea or vomiting. 06/20/22  Yes Rickard Patience, MD   prochlorperazine (COMPAZINE) 10 MG tablet Take 1 tablet (10 mg total) by mouth every 6 (six) hours as needed for nausea or vomiting. 06/20/22  Yes Rickard Patience, MD  TRESIBA FLEXTOUCH 200 UNIT/ML SOPN Inject 80 Units into the muscle daily before breakfast.  06/20/17  Yes [provider]  TRULICITY 1.5 MG/0.5ML SOPN Inject into the skin. 08/10/22  Yes [provider]  vitamin B-12 (CYANOCOBALAMIN) 500 MCG tablet Take 500 mcg by mouth daily.   Yes [provider]       Vida Rigger, M.D.  Pulmonary & Critical Care Medicine

## 2022-09-22 NOTE — Progress Notes (Signed)
   09/22/22 1100  Spiritual Encounters  Type of Visit Initial  Care provided to: Pt and family  Referral source Chaplain assessment  Reason for visit Routine spiritual support  OnCall Visit Yes  Spiritual Framework  Community/Connection Family  Patient Stress Factors Not reviewed  Family Stress Factors Not reviewed  Intervention Outcomes  Outcomes Awareness of support  Spiritual Care Plan  Spiritual Care Issues Still Outstanding Chaplain will continue to follow   Spoke with patient and family during routine rounding. Patient and family is one I have spoke with before in the cancer center. Advise them that we are here 24/7.

## 2022-09-22 NOTE — Consult Note (Signed)
Consultation Note Date: 09/22/2022   Patient Name: Keith Howe  DOB: February 17, 1941  MRN: [id number]  Age / Sex: 82 y.o., male  PCP: Rickard Patience, MD Referring Physician: Charise Killian, MD  Reason for Consultation: Establishing goals of care  HPI/Patient Profile: This is an 82 yo male with a PMH of Prostate Cancer (currently receiving chemotherapy), HTN, and CKD who presented to [facility] ER on 05/16 from the infusion center after a brief period of unresponsiveness. The pt was scheduled to received 2 units of pRBC's for an hgb of 5.7 on 05/15 likely secondary to bone marrow suppression as well as anemia due to CKD per oncology progress notes. CT Abd Pelvis ordered by outpatient oncologist due to drop in hgb on 09/20/22 which was negative for a bleed or retroperitoneal hemorrhage, but concerning for new moderate to severe bilateral hydroureteronephrosis to the level of the bladder. Today at the infusion center nurse reported the pt became lethargic with his eyes open and unable to answer questions or follow commands which lasted for a minute. Pt immediately wheeled to the ER for evaluation.    Clinical Assessment and Goals of Care: Notes and labs reviewed.  Spoke with oncology about current care and plans moving forward from an oncologic standpoint, including considering alternative to current chemo which she is unable to tolerate.   In to see patient.  Patient's wife and sister were at bedside. Wife states that up until a few days ago he was fully independent and was mowing the yard.  They have been well updated on his current status.  They discuss plans for follow-up with oncology outpatient to look for alternatives to his current chemo regimen.    At this point patient's lunch came, and he was eager to partake.    SUMMARY OF RECOMMENDATIONS   PMT will follow  Prognosis:  Unable to determine         Primary Diagnoses: Present on Admission:  Symptomatic anemia  Essential hypertension  Chronic kidney disease, stage 3b (HCC)  Bilateral hydronephrosis  Type II diabetes mellitus with renal manifestations (HCC)  Obesity (BMI 30-39.9)  Prostate cancer (HCC)  Normocytic anemia  Syncope  Leukocytosis  Bilateral leg edema   I have reviewed the medical record, interviewed the patient and family, and examined the patient. The following aspects are pertinent.  Past Medical History:  Diagnosis Date   BPH (benign prostatic hyperplasia)    Cancer (HCC)    Chronic kidney disease    STAGE 3   Diabetes mellitus without complication (HCC)    HOH (hard of hearing)    AIDS   Hypertension    Inguinal hernia    Pain    CHRONIC LBP   Prostate cancer (HCC)    Social History   Socioeconomic History   Marital status: Married    Spouse name: Not on file   Number of children: Not on file   Years of education: Not on file   Highest education level: Some college, no degree  Occupational History  Not on file  Tobacco Use   Smoking status: Never   Smokeless tobacco: Never  Vaping Use   Vaping Use: Never used  Substance and Sexual Activity   Alcohol use: No   Drug use: Never   Sexual activity: Not Currently  Other Topics Concern   Not on file  Social History Narrative   Independent at baseline.  Lives at home with his wife   Social Determinants of Health   Financial Resource Strain: Low Risk  (05/12/2021)   Overall Financial Resource Strain (CARDIA)    Difficulty of Paying Living Expenses: Not hard at all  Food Insecurity: No Food Insecurity (07/31/2022)   Hunger Vital Sign    Worried About Running Out of Food in the Last Year: Never true    Ran Out of Food in the Last Year: Never true  Transportation Needs: No Transportation Needs (07/31/2022)   PRAPARE - Administrator, Civil Service (Medical): No    Lack of Transportation (Non-Medical): No  Physical Activity:  Insufficiently Active (05/12/2021)   Exercise Vital Sign    Days of Exercise per Week: 7 days    Minutes of Exercise per Session: 10 min  Stress: No Stress Concern Present (05/12/2021)   [employer] of Occupational Health - Occupational Stress Questionnaire    Feeling of Stress : Not at all  Social Connections: Socially Integrated (05/12/2021)   Social Connection and Isolation Panel [NHANES]    Frequency of Communication with Friends and Family: More than three times a week    Frequency of Social Gatherings with Friends and Family: Once a week    Attends Religious Services: More than 4 times per year    Active Member of [employer] or Organizations: Yes    Attends Engineer, structural: More than 4 times per year    Marital Status: Married   Family History  Problem Relation Age of Onset   Diabetes Father    Breast cancer Sister    Scheduled Meds:  sodium chloride   Intravenous Once   Chlorhexidine Gluconate Cloth  6 each Topical Daily   cholecalciferol  1,000 Units Oral Daily   cyanocobalamin  500 mcg Oral Daily   insulin aspart  0-15 Units Subcutaneous Q4H   insulin glargine-yfgn  30 Units Subcutaneous BID   midodrine  10 mg Oral TID WC   omega-3 acid ethyl esters  1 g Oral Daily   pantoprazole (PROTONIX) IV  40 mg Intravenous Q12H   pravastatin  40 mg Oral q1800   Continuous Infusions:  sodium chloride     sodium chloride     sodium chloride 75 mL/hr at 09/22/22 1200   PRN Meds:.acetaminophen, metoprolol tartrate, ondansetron (ZOFRAN) IV Medications Prior to Admission:  Prior to Admission medications   Medication Sig Start Date End Date Taking? Authorizing Provider  amLODipine (NORVASC) 5 MG tablet Take 5 mg by mouth daily.   Yes [provider]  aspirin EC 81 MG tablet Take 81 mg by mouth daily.   Yes [provider]  cholecalciferol (VITAMIN D) 1000 units tablet Take 1,000 Units by mouth daily.   Yes [provider]  dapagliflozin  propanediol (FARXIGA) 10 MG TABS tablet Take 10 mg by mouth daily.   Yes [provider]  dexamethasone (DECADRON) 4 MG tablet Take 2 tabs by mouth daily starting the day before chemo. Then take 2 tabs daily for 2 days starting day after chemo. Take with food. 06/20/22  Yes Rickard Patience, MD  Iron-Vitamin C 65-125 MG TABS Take 1 tablet by mouth daily. 08/23/22  Yes Rickard Patience, MD  loratadine (CLARITIN) 10 MG tablet Take 10 mg by mouth once daily before chemo and at least 2 days after injection.   Yes [provider]  losartan (COZAAR) 100 MG tablet Take 100 mg by mouth daily.   Yes [provider]  lovastatin (MEVACOR) 40 MG tablet TAKE 1 TABLET BY MOUTH EVERY DAY Patient taking differently: Take 40 mg by mouth at bedtime. 04/25/22  Yes Masoud, Renda Rolls, MD  metoprolol tartrate (LOPRESSOR) 50 MG tablet TAKE 1 TABLET BY MOUTH EVERY 12 HOURS Patient taking differently: Take 50 mg by mouth 2 (two) times daily. 11/25/21  Yes Masoud, Renda Rolls, MD  Misc Natural Products (PROSTATE SUPPORT PO) Take 2 capsules by mouth daily.   Yes [provider]  NOVOLOG FLEXPEN 100 UNIT/ML FlexPen Inject into the skin. Per sliding scale 01/03/18  Yes [provider]  Omega-3 Fatty Acids (FISH OIL PO) Take 1 capsule by mouth daily.    Yes [provider]  ondansetron (ZOFRAN) 8 MG tablet Take 1 tablet (8 mg total) by mouth every 8 (eight) hours as needed for nausea or vomiting. 06/20/22  Yes Rickard Patience, MD  prochlorperazine (COMPAZINE) 10 MG tablet Take 1 tablet (10 mg total) by mouth every 6 (six) hours as needed for nausea or vomiting. 06/20/22  Yes Rickard Patience, MD  TRESIBA FLEXTOUCH 200 UNIT/ML SOPN Inject 80 Units into the muscle daily before breakfast.  06/20/17  Yes [provider]  TRULICITY 1.5 MG/0.5ML SOPN Inject into the skin. 08/10/22  Yes [provider]  vitamin B-12 (CYANOCOBALAMIN) 500 MCG tablet Take 500 mcg by mouth daily.   Yes [provider]   No  Known Allergies Review of Systems  All other systems reviewed and are negative.   Physical Exam Pulmonary:     Effort: Pulmonary effort is normal.  Neurological:     Mental Status: He is alert.     Vital Signs: BP 131/72   Pulse 100   Temp 98.3 F (36.8 C) (Oral)   Resp 17   Ht 6\' 1"  (1.854 m)   Wt 105 kg   SpO2 100%   BMI 30.54 kg/m  Pain Scale: 0-10   Pain Score: Asleep   SpO2: SpO2: 100 % O2 Device:SpO2: 100 % O2 Flow Rate: .   IO: Intake/output summary:  Intake/Output Summary (Last 24 hours) at 09/22/2022 1308 Last data filed at 09/22/2022 1200 Gross per 24 hour  Intake 3120.7 ml  Output 5725 ml  Net -2604.3 ml    LBM: Last BM Date : 09/22/22 Baseline Weight: Weight: 105 kg Most recent weight: Weight: 105 kg        Signed by: Morton Stall, NP   Please contact Palliative Medicine Team phone at [phone] for questions and concerns.  For individual provider: See Loretha Stapler

## 2022-09-22 NOTE — Progress Notes (Signed)
PROGRESS NOTE    EVART KI  ZOX:[id number] DOB: [birth year] DOA: 09/21/2022 PCP: Rickard Patience, MD  Assessment & Plan:   Principal Problem:   Symptomatic anemia Active Problems:   Normocytic anemia   Syncope   Essential hypertension   Prostate cancer (HCC)   Type II diabetes mellitus with renal manifestations (HCC)   Chronic kidney disease, stage 3b (HCC)   Leukocytosis   Bilateral hydronephrosis   Bilateral leg edema   Obesity (BMI 30-39.9)  Assessment and Plan: Symptomatic anemia: w/ hx of normocytic anemia. Likely secondary to delayed bone marrow recovery due to chemo as per onco. S/p 2 units of pRBCs given. Continue on IVFs, midodrine  Hypotension: initially required pressors but have since been weaned off. Continue on IVFs & midodrine. Likely secondary severe anemia, Hb 5.2    Syncope: CT head negative. Possibly due to volume depletion secondary to severe anemia.  HTN: holding home dose of amlodipine, losartan, metoprolol in setting of severe anemia & risk of developing hypotension    Prostate cancer: on chemo, last dose of chemo was on 08/23/2022. Onco following and recs apprec   DM2: well controlled, HbA1c 6.8. Continue on glargine, SSI w/ accuchecks    CKDIIIb: Cr is trending down from day prior. Continue on IVFs    Leukocytosis: likely reactive    Bilateral hydronephrosis: CT showed new moderate to severe bilateral hydroureteronephrosis to the level of the bladder, possibly due to chronic bladder outlet obstruction. Continue w/ foley as per urology. Will need outpatient f/u w/ uro in 2-3 weeks   Bilateral leg edema: no hx of CHF. BNP is not elevated. Possibly third spacing    Obesity: BMI 30.5. Would benefit from weight loss      DVT prophylaxis: SCDs  Code Status: full  Family Communication: discussed pt's care w/ pt's wife, [name], and answered her questions  Disposition Plan: depends on PT/OT recs   Level of care: Stepdown  Status is:  Inpatient Remains inpatient appropriate because: severity of illness     Consultants:  ICU  Urology  Onco   Procedures:   Antimicrobials:   Subjective: Pt c/o fatigue   Objective: Vitals:   09/22/22 0430 09/22/22 0500 09/22/22 0600 09/22/22 0700  BP:  126/71 108/84 100/65  Pulse: (!) 40 (!) 123 (!) 124 (!) 40  Resp: 19 17 18 18   Temp:      TempSrc:      SpO2: 100% 100% 100% 100%  Weight:      Height:        Intake/Output Summary (Last 24 hours) at 09/22/2022 0831 Last data filed at 09/22/2022 0602 Gross per 24 hour  Intake 2866.06 ml  Output 5275 ml  Net -2408.94 ml   Filed Weights   09/21/22 1107  Weight: 105 kg    Examination:  General exam: Appears calm and comfortable  Respiratory system: Clear to auscultation. Respiratory effort normal. Cardiovascular system: S1 & S2+. No rubs, gallops or clicks.  Gastrointestinal system: Abdomen is nondistended, soft and nontender. Normal bowel sounds heard. Central nervous system: Alert and oriented. Moves all extremities  Psychiatry: Judgement and insight appear normal. Flat mood and affect     Data Reviewed: I have personally reviewed following labs and imaging studies  CBC: Recent Labs  Lab 09/20/22 0849 09/21/22 0931 09/21/22 1640 09/21/22 2300 09/22/22 0410  WBC 11.2* 12.3* 11.9* 14.4* 16.8*  NEUTROABS 10.0*  --   --   --   --   HGB 5.7* 3.9* 5.2*  8.8* 9.1*  HCT 18.5* 12.8* 16.0* 26.7* 26.9*  MCV 93.0 97.7 88.9 88.7 86.8  PLT 311 227 178 146* 162   Basic Metabolic Panel: Recent Labs  Lab 09/20/22 0849 09/21/22 0931 09/21/22 2300 09/22/22 0409  NA 138 135  --  144  K 4.4 4.8 4.5 4.0  CL 106 106  --  117*  CO2 17* 14*  --  20*  GLUCOSE 258* 285*  --  134*  BUN 83* 103*  --  111*  CREATININE 1.94* 2.12*  --  1.76*  CALCIUM 8.6* 8.3*  --  8.4*  MG  --   --  2.2 2.4  PHOS  --   --  4.1 3.3   GFR: Estimated Creatinine Clearance: 41.9 mL/min (A) (by C-G formula based on SCr of 1.76 mg/dL  (H)). Liver Function Tests: Recent Labs  Lab 09/20/22 0849 09/21/22 0931 09/22/22 0409  AST 34 37 20  ALT 16 27 23   ALKPHOS 36* 36* 33*  BILITOT 0.3 0.6 0.6  PROT 5.7* 4.8* 5.0*  ALBUMIN 3.1* 2.6* 2.9*   No results for input(s): "LIPASE", "AMYLASE" in the last 168 hours. No results for input(s): "AMMONIA" in the last 168 hours. Coagulation Profile: Recent Labs  Lab 09/21/22 1640  INR 1.4*   Cardiac Enzymes: No results for input(s): "CKTOTAL", "CKMB", "CKMBINDEX", "TROPONINI" in the last 168 hours. BNP (last 3 results) No results for input(s): "PROBNP" in the last 8760 hours. HbA1C: No results for input(s): "HGBA1C" in the last 72 hours. CBG: Recent Labs  Lab 09/22/22 0229 09/22/22 0330 09/22/22 0438 09/22/22 0552 09/22/22 0728  GLUCAP 171* 115* 90 96 75   Lipid Profile: No results for input(s): "CHOL", "HDL", "LDLCALC", "TRIG", "CHOLHDL", "LDLDIRECT" in the last 72 hours. Thyroid Function Tests: No results for input(s): "TSH", "T4TOTAL", "FREET4", "T3FREE", "THYROIDAB" in the last 72 hours. Anemia Panel: Recent Labs    09/20/22 0849 09/21/22 1640 09/21/22 2300  VITAMINB12  --   --  4,945*  FERRITIN 67  --   --   TIBC 298  --   --   IRON 87  --   --   RETICCTPCT  --  6.9*  --    Sepsis Labs: No results for input(s): "PROCALCITON", "LATICACIDVEN" in the last 168 hours.  Recent Results (from the past 240 hour(s))  MRSA Next Gen by PCR, Nasal     Status: None   Collection Time: 09/21/22 12:50 PM   Specimen: Nasal Mucosa; Nasal Swab  Result Value Ref Range Status   MRSA by PCR Next Gen NOT DETECTED NOT DETECTED Final    Comment: (NOTE) The GeneXpert MRSA Assay (FDA approved for NASAL specimens only), is one component of a comprehensive MRSA colonization surveillance program. It is not intended to diagnose MRSA infection nor to guide or monitor treatment for MRSA infections. Test performance is not FDA approved in patients less than 24  years old. Performed at [facility], [address] [place], [place] [address]          Radiology Studies: CT HEAD WO CONTRAST ( )  Result Date: 09/21/2022 CLINICAL DATA:  Altered mental status EXAM: CT HEAD WITHOUT CONTRAST TECHNIQUE: Contiguous axial images were obtained from the base of the skull through the vertex without intravenous contrast. RADIATION DOSE REDUCTION: This exam was performed according to the departmental dose-optimization program which includes automated exposure control, adjustment of the mA and/or kV according to patient size and/or use of iterative reconstruction technique. COMPARISON:  CT head  07/06/15 FINDINGS: Brain: No hemorrhage. No hydrocephalus. No extra-axial fluid collection. No CT evidence of an acute cortical infarct. No mass effect. No mass lesion. There is mild overall chronic microvascular ischemic change. Vascular: No hyperdense vessel or unexpected calcification. Skull: Normal. Negative for fracture or focal lesion. Sinuses/Orbits: No middle ear or mastoid effusion. Paranasal sinuses are clear. Orbits are unremarkable. Other: None. IMPRESSION: No acute intracranial process. No specific etiology for altered mental status identified. Electronically Signed   By: Lorenza Cambridge M.D.   On: 09/21/2022 10:12   CT Abdomen Pelvis Wo Contrast  Result Date: 09/20/2022 CLINICAL DATA:  Symptomatic anemia. Clinical suspicion for retroperitoneal hemorrhage. EXAM: CT ABDOMEN AND PELVIS WITHOUT CONTRAST TECHNIQUE: Multidetector CT imaging of the abdomen and pelvis was performed following the standard protocol without IV contrast. RADIATION DOSE REDUCTION: This exam was performed according to the departmental dose-optimization program which includes automated exposure control, adjustment of the mA and/or kV according to patient size and/or use of iterative reconstruction technique. COMPARISON:  05/13/2020 FINDINGS: Lower chest: No acute findings. Hepatobiliary: No  mass visualized on this unenhanced exam. Gallbladder is unremarkable. No evidence of biliary ductal dilatation. Pancreas: No mass or inflammatory process visualized on this unenhanced exam. Spleen:  Within normal limits in size. Adrenals/Urinary tract: No evidence of urolithiasis. No suspicious renal masses identified. Moderate to severe bilateral hydroureteronephrosis is seen to the level of the bladder, which is new since previous study. No ureteral calculi or other obstructing etiology visualized. Distended urinary bladder noted with mild diffuse wall thickening. Stomach/Bowel: No evidence of obstruction, inflammatory process, or abnormal fluid collections. Normal appendix visualized. Moderate colonic stool burden noted. Vascular/Lymphatic: No pathologically enlarged lymph nodes identified. No evidence of abdominal aortic aneurysm. Aortic atherosclerotic calcification incidentally noted. Reproductive: Moderately enlarged prostate gland, without significant change. Other:  None.  No evidence of retroperitoneal hemorrhage. Musculoskeletal:  No suspicious bone lesions identified. IMPRESSION: No evidence of retroperitoneal hemorrhage. New moderate to severe bilateral hydroureteronephrosis to the level of the bladder. No ureteral calculi or other obstructing etiology visualized. This is likely due to vesicoureteral reflux given distended urinary bladder. Distended urinary bladder with mild diffuse wall thickening, presumably due to chronic bladder outlet obstruction. Moderately enlarged prostate gland, without significant change. Moderate colonic stool burden; recommend correlation for symptoms or signs of constipation. Electronically Signed   By: Danae Orleans M.D.   On: 09/20/2022 12:49        Scheduled Meds:  sodium chloride   Intravenous Once   Chlorhexidine Gluconate Cloth  6 each Topical Daily   cholecalciferol  1,000 Units Oral Daily   cyanocobalamin  500 mcg Oral Daily   insulin aspart  0-15 Units  Subcutaneous Q4H   insulin glargine-yfgn  30 Units Subcutaneous BID   midodrine  10 mg Oral TID WC   omega-3 acid ethyl esters  1 g Oral Daily   pantoprazole (PROTONIX) IV  40 mg Intravenous Q12H   pravastatin  40 mg Oral q1800   Continuous Infusions:  sodium chloride     sodium chloride     sodium chloride 75 mL/hr at 09/22/22 0602   norepinephrine (LEVOPHED) Adult infusion Stopped (09/21/22 1706)     LOS: 1 day    Time spent: 35 mins     Charise Killian, MD [facility] Pager [phone]  If 7PM-7AM, please contact night-coverage [email or web address] 09/22/2022, 8:31 AM

## 2022-09-22 NOTE — Progress Notes (Signed)
Urology Inpatient Progress Note  Subjective: He developed some gross hematuria overnight with drainage of frankly bloody urine and clots per nursing. Hemoglobin up this morning, 9.1 s/p 4 units PRBCs yesterday.  Creatinine down, 1.76. Foley catheter in place draining clear, pink-tinged urine.  He has no acute concerns this morning.  He is accompanied at the bedside by his wife and another visitor.  Anti-infectives: Anti-infectives (From admission, onward)    None       Current Facility-Administered Medications  Medication Dose Route Frequency Provider Last Rate Last Admin   0.9 %  sodium chloride infusion (Manually program via Guardrails IV Fluids)   Intravenous Once Ezequiel Essex, NP       0.9 %  sodium chloride infusion  10 mL/hr Intravenous Once Minna Antis, MD       0.9 %  sodium chloride infusion  10 mL/hr Intravenous Once Minna Antis, MD       0.9 %  sodium chloride infusion   Intravenous Continuous Lorretta Harp, MD 75 mL/hr at 09/22/22 0602 Infusion Verify at 09/22/22 0602   acetaminophen (TYLENOL) tablet 650 mg  650 mg Oral Q6H PRN Lorretta Harp, MD       Chlorhexidine Gluconate Cloth 2 % PADS 6 each  6 each Topical Daily Lorretta Harp, MD       cholecalciferol (VITAMIN D3) 25 MCG (1000 UNIT) tablet 1,000 Units  1,000 Units Oral Daily Lorretta Harp, MD   1,000 Units at 09/21/22 1349   cyanocobalamin (VITAMIN B12) tablet 500 mcg  500 mcg Oral Daily Lorretta Harp, MD   500 mcg at 09/21/22 1347   insulin aspart (novoLOG) injection 0-15 Units  0-15 Units Subcutaneous Q4H Rust-Chester, Cecelia Byars, NP       insulin glargine-yfgn (SEMGLEE) injection 30 Units  30 Units Subcutaneous BID Rust-Chester, Cecelia Byars, NP       metoprolol tartrate (LOPRESSOR) injection 5 mg  5 mg Intravenous Q2H PRN Lorretta Harp, MD   5 mg at 09/22/22 0836   midodrine (PROAMATINE) tablet 10 mg  10 mg Oral TID WC Lorretta Harp, MD   10 mg at 09/22/22 0835   norepinephrine (LEVOPHED) 4mg  in (0.016 mg/mL) premix  infusion  0-40 mcg/min Intravenous Titrated Vida Rigger, MD   Stopped at 09/21/22 1706   omega-3 acid ethyl esters (LOVAZA) capsule 1 g  1 g Oral Daily Lorretta Harp, MD   1 g at 09/21/22 1350   ondansetron (ZOFRAN) injection 4 mg  4 mg Intravenous Q8H PRN Lorretta Harp, MD       pantoprazole (PROTONIX) injection 40 mg  40 mg Intravenous Q12H Lorretta Harp, MD   40 mg at 09/21/22 2147   pravastatin (PRAVACHOL) tablet 40 mg  40 mg Oral q1800 Lorretta Harp, MD       Objective: Vital signs in last 24 hours: Temp:  [97.5 F (36.4 C)-99.2 F (37.3 C)] 98.3 F (36.8 C) (05/17 0800) Pulse Rate:  [40-146] 123 (05/17 0800) Resp:  [14-26] 20 (05/17 0800) BP: (54-154)/(30-112) 113/60 (05/17 0800) SpO2:  [97 %-100 %] 100 % (05/17 0700) Weight:  [105 kg] 105 kg (05/16 1107)  Intake/Output from previous day: 05/16 0701 - 05/17 0700 In: 2866.1 [I.V.:1604.1; Blood:1262] Out: 5275 [Urine:5275] Intake/Output this shift: No intake/output data recorded.  Physical Exam Vitals and nursing note reviewed.  Constitutional:      General: He is not in acute distress.    Appearance: He is not toxic-appearing or diaphoretic.  HENT:     Head:  Normocephalic and atraumatic.  Pulmonary:     Effort: Pulmonary effort is normal. No respiratory distress.  Skin:    General: Skin is warm and dry.  Neurological:     Mental Status: He is alert and oriented to person, place, and time.  Psychiatric:        Mood and Affect: Mood normal.        Behavior: Behavior normal.    Lab Results:  Recent Labs    09/21/22 2300 09/22/22 0410  WBC 14.4* 16.8*  HGB 8.8* 9.1*  HCT 26.7* 26.9*  PLT 146* 162   BMET Recent Labs    09/21/22 0931 09/21/22 2300 09/22/22 0409  NA 135  --  144  K 4.8 4.5 4.0  CL 106  --  117*  CO2 14*  --  20*  GLUCOSE 285*  --  134*  BUN 103*  --  111*  CREATININE 2.12*  --  1.76*  CALCIUM 8.3*  --  8.4*   PT/INR Recent Labs    09/21/22 1640  LABPROT 17.8*  INR 1.4*   ABG Recent  Labs    09/21/22 2308  HCO3 19.1*    Studies/Results: CT HEAD WO CONTRAST ( )  Result Date: 09/21/2022 CLINICAL DATA:  Altered mental status EXAM: CT HEAD WITHOUT CONTRAST TECHNIQUE: Contiguous axial images were obtained from the base of the skull through the vertex without intravenous contrast. RADIATION DOSE REDUCTION: This exam was performed according to the departmental dose-optimization program which includes automated exposure control, adjustment of the mA and/or kV according to patient size and/or use of iterative reconstruction technique. COMPARISON:  CT head 07/06/15 FINDINGS: Brain: No hemorrhage. No hydrocephalus. No extra-axial fluid collection. No CT evidence of an acute cortical infarct. No mass effect. No mass lesion. There is mild overall chronic microvascular ischemic change. Vascular: No hyperdense vessel or unexpected calcification. Skull: Normal. Negative for fracture or focal lesion. Sinuses/Orbits: No middle ear or mastoid effusion. Paranasal sinuses are clear. Orbits are unremarkable. Other: None. IMPRESSION: No acute intracranial process. No specific etiology for altered mental status identified. Electronically Signed   By: Lorenza Cambridge M.D.   On: 09/21/2022 10:12   CT Abdomen Pelvis Wo Contrast  Result Date: 09/20/2022 CLINICAL DATA:  Symptomatic anemia. Clinical suspicion for retroperitoneal hemorrhage. EXAM: CT ABDOMEN AND PELVIS WITHOUT CONTRAST TECHNIQUE: Multidetector CT imaging of the abdomen and pelvis was performed following the standard protocol without IV contrast. RADIATION DOSE REDUCTION: This exam was performed according to the departmental dose-optimization program which includes automated exposure control, adjustment of the mA and/or kV according to patient size and/or use of iterative reconstruction technique. COMPARISON:  05/13/2020 FINDINGS: Lower chest: No acute findings. Hepatobiliary: No mass visualized on this unenhanced exam. Gallbladder is unremarkable.  No evidence of biliary ductal dilatation. Pancreas: No mass or inflammatory process visualized on this unenhanced exam. Spleen:  Within normal limits in size. Adrenals/Urinary tract: No evidence of urolithiasis. No suspicious renal masses identified. Moderate to severe bilateral hydroureteronephrosis is seen to the level of the bladder, which is new since previous study. No ureteral calculi or other obstructing etiology visualized. Distended urinary bladder noted with mild diffuse wall thickening. Stomach/Bowel: No evidence of obstruction, inflammatory process, or abnormal fluid collections. Normal appendix visualized. Moderate colonic stool burden noted. Vascular/Lymphatic: No pathologically enlarged lymph nodes identified. No evidence of abdominal aortic aneurysm. Aortic atherosclerotic calcification incidentally noted. Reproductive: Moderately enlarged prostate gland, without significant change. Other:  None.  No evidence of retroperitoneal hemorrhage. Musculoskeletal:  No suspicious  bone lesions identified. IMPRESSION: No evidence of retroperitoneal hemorrhage. New moderate to severe bilateral hydroureteronephrosis to the level of the bladder. No ureteral calculi or other obstructing etiology visualized. This is likely due to vesicoureteral reflux given distended urinary bladder. Distended urinary bladder with mild diffuse wall thickening, presumably due to chronic bladder outlet obstruction. Moderately enlarged prostate gland, without significant change. Moderate colonic stool burden; recommend correlation for symptoms or signs of constipation. Electronically Signed   By: Danae Orleans M.D.   On: 09/20/2022 12:49    Assessment & Plan: 82 year old male with metastatic prostate cancer and anemia admitted with symptomatic anemia and urinary retention with bilateral hydroureteronephrosis, now s/p Foley placement and 4 units PRBC transfusion.  He developed some gross hematuria overnight that is now resolving.   Foley catheter is draining well, no indication for irrigation at this time.  Suspect hematuria was due to rapid bladder decompression following Foley placement.  Please continue with prior recommendations.  Carman Ching, PA-C 09/22/2022

## 2022-09-22 NOTE — Progress Notes (Addendum)
Hematology/Oncology Progress note [phone] [phone]     Patient Care Team: Rickard Patience, MD as PCP - General (Oncology) Carmina Miller, MD as Consulting Physician (Radiation Oncology) Rickard Patience, MD as Consulting Physician (Oncology) Mady Haagensen, MD as Consulting Physician (Nephrology) Orson Ape, MD as Consulting Physician (Urology)   Name of the patient: Keith Howe  [id number]  10-10-1940  Date of visit: 09/22/22   INTERVAL HISTORY-   No acute overnight events. Sister and wife are at the bedside.   No Known Allergies  Patient Active Problem List   Diagnosis Date Noted   Prostate cancer (HCC) 07/13/2021    Priority: High   Bilateral hydronephrosis 09/20/2022    Priority: Medium    Chemotherapy-induced neuropathy (HCC) 09/13/2022    Priority: Medium    Normocytic anemia 07/31/2022    Priority: Medium    CKD (chronic kidney disease) stage 3, GFR 30-59 ml/min (HCC) 05/27/2020    Priority: Medium    Weight loss 08/09/2022    Priority: Low   Chemotherapy induced neutropenia (HCC) 07/05/2022    Priority: Low   Encounter for antineoplastic chemotherapy 05/26/2022    Priority: Low   Goals of care, counseling/discussion 07/13/2021    Priority: Low   Type 2 diabetes mellitus without complication, with long-term current use of insulin (HCC) 10/13/2019    Priority: Low   Symptomatic anemia 09/21/2022   Chronic kidney disease, stage 3b (HCC) 09/21/2022   Leukocytosis 09/21/2022   Syncope 07/31/2022   SIRS (systemic inflammatory response syndrome) (HCC) 07/31/2022   Obesity (BMI 30-39.9) 07/31/2022   Type II diabetes mellitus with renal manifestations (HCC) 07/31/2022   Myocardial injury 07/31/2022   Bilateral leg edema 07/31/2022   Injury of quadriceps muscle 08/26/2020   Annual physical exam 02/18/2020   Need for influenza vaccination 02/18/2020   Essential hypertension 12/15/2019   Class 1 obesity with serious comorbidity and  body mass index (BMI) of 31.0 to 31.9 in adult 10/13/2019   S/P TKR (total knee replacement) using cement, left 03/19/2018     Past Medical History:  Diagnosis Date   BPH (benign prostatic hyperplasia)    Cancer (HCC)    Chronic kidney disease    STAGE 3   Diabetes mellitus without complication (HCC)    HOH (hard of hearing)    AIDS   Hypertension    Inguinal hernia    Pain    CHRONIC LBP   Prostate cancer [facility])      Past Surgical History:  Procedure Laterality Date   BACK SURGERY     Lumbar   CATARACT EXTRACTION W/PHACO Right 07/10/2017   Procedure: CATARACT EXTRACTION PHACO AND INTRAOCULAR LENS PLACEMENT (IOC);  Surgeon: Galen Manila, MD;  Location: [facility] ORS;  Service: Ophthalmology;  Laterality: Right;  [place] 00:29.2 AP% 16.5 CDE 4.83 Fluid Pack Lot # [id number]   COLONOSCOPY     EYE SURGERY Bilateral    cataract extraction   HERNIA REPAIR     INGUINAL   PROSTATE BIOPSY N/A 04/08/2020   Procedure: PROSTATE BIOPSY Addison Bailey;  Surgeon: Orson Ape, MD;  Location: [facility] ORS;  Service: Urology;  Laterality: N/A;   TOTAL KNEE ARTHROPLASTY Left 03/19/2018   Procedure: TOTAL KNEE ARTHROPLASTY;  Surgeon: Juanell Fairly, MD;  Location: [facility] ORS;  Service: Orthopedics;  Laterality: Left;    Social History   Socioeconomic History   Marital status: Married    Spouse name: Not on file   Number of children: Not on file   Years  of education: Not on file   Highest education level: Some college, no degree  Occupational History   Not on file  Tobacco Use   Smoking status: Never   Smokeless tobacco: Never  Vaping Use   Vaping Use: Never used  Substance and Sexual Activity   Alcohol use: No   Drug use: Never   Sexual activity: Not Currently  Other Topics Concern   Not on file  Social History Narrative   Independent at baseline.  Lives at home with his wife   Social Determinants of Health   Financial Resource Strain: Low Risk  (05/12/2021)   Overall Financial Resource  Strain (CARDIA)    Difficulty of Paying Living Expenses: Not hard at all  Food Insecurity: No Food Insecurity (07/31/2022)   Hunger Vital Sign    Worried About Running Out of Food in the Last Year: Never true    Ran Out of Food in the Last Year: Never true  Transportation Needs: No Transportation Needs (07/31/2022)   PRAPARE - Administrator, Civil Service (Medical): No    Lack of Transportation (Non-Medical): No  Physical Activity: Insufficiently Active (05/12/2021)   Exercise Vital Sign    Days of Exercise per Week: 7 days    Minutes of Exercise per Session: 10 min  Stress: No Stress Concern Present (05/12/2021)   [employer] of Occupational Health - Occupational Stress Questionnaire    Feeling of Stress : Not at all  Social Connections: Socially Integrated (05/12/2021)   Social Connection and Isolation Panel [NHANES]    Frequency of Communication with Friends and Family: More than three times a week    Frequency of Social Gatherings with Friends and Family: Once a week    Attends Religious Services: More than 4 times per year    Active Member of [employer] or Organizations: Yes    Attends Engineer, structural: More than 4 times per year    Marital Status: Married  Catering manager Violence: Not At Risk (07/31/2022)   Humiliation, Afraid, Rape, and Kick questionnaire    Fear of Current or Ex-Partner: No    Emotionally Abused: No    Physically Abused: No    Sexually Abused: No     Family History  Problem Relation Age of Onset   Diabetes Father    Breast cancer Sister      Current Facility-Administered Medications:    0.9 %  sodium chloride infusion (Manually program via Guardrails IV Fluids), , Intravenous, Once, Ezequiel Essex, NP   0.9 %  sodium chloride infusion, 10 mL/hr, Intravenous, Once, Minna Antis, MD   0.9 %  sodium chloride infusion, 10 mL/hr, Intravenous, Once, Minna Antis, MD   0.9 %  sodium chloride infusion, , Intravenous,  Continuous, Lorretta Harp, MD, Last Rate: 75 mL/hr at 09/22/22 1900, Infusion Verify at 09/22/22 1900   acetaminophen (TYLENOL) tablet 650 mg, 650 mg, Oral, Q6H PRN, Lorretta Harp, MD   Chlorhexidine Gluconate Cloth 2 % PADS 6 each, 6 each, Topical, Daily, Lorretta Harp, MD, 6 each at 09/22/22 1052   cholecalciferol (VITAMIN D3) 25 MCG (1000 UNIT) tablet 1,000 Units, 1,000 Units, Oral, Daily, Lorretta Harp, MD, 1,000 Units at 09/22/22 1051   cyanocobalamin (VITAMIN B12) tablet 500 mcg, 500 mcg, Oral, Daily, Lorretta Harp, MD, 500 mcg at 09/22/22 1051   insulin aspart (novoLOG) injection 0-15 Units, 0-15 Units, Subcutaneous, Q4H, Rust-Chester, Britton L, NP, 2 Units at 09/22/22 1300   insulin glargine-yfgn (SEMGLEE) injection 30 Units,  30 Units, Subcutaneous, BID, Rust-Chester, Britton L, NP, 30 Units at 09/22/22 1052   metoprolol tartrate (LOPRESSOR) injection 5 mg, 5 mg, Intravenous, Q2H PRN, Lorretta Harp, MD, 5 mg at 09/22/22 0836   midodrine (PROAMATINE) tablet 10 mg, 10 mg, Oral, TID WC, Lorretta Harp, MD, 10 mg at 09/22/22 1704   omega-3 acid ethyl esters (LOVAZA) capsule 1 g, 1 g, Oral, Daily, Lorretta Harp, MD, 1 g at 09/22/22 1051   ondansetron (ZOFRAN) injection 4 mg, 4 mg, Intravenous, Q8H PRN, Lorretta Harp, MD   pantoprazole (PROTONIX) injection 40 mg, 40 mg, Intravenous, Q12H, Lorretta Harp, MD, 40 mg at 09/22/22 1052   pravastatin (PRAVACHOL) tablet 40 mg, 40 mg, Oral, q1800, Lorretta Harp, MD, 40 mg at 09/22/22 1704   Physical exam:  Vitals:   09/22/22 1600 09/22/22 1700 09/22/22 1800 09/22/22 1900  BP:  115/62 (!) 135/59 (!) 122/55  Pulse:  (!) 107    Resp: 16 14 12 18   Temp: 99.1 F (37.3 C)     TempSrc: Oral     SpO2:  100% 100% 100%  Weight:      Height:       Physical Exam Constitutional:      General: He is not in acute distress.    Appearance: He is not diaphoretic.  HENT:     Head: Normocephalic and atraumatic.     Mouth/Throat:     Pharynx: Oropharyngeal exudate present.  Eyes:      General: No scleral icterus.    Pupils: Pupils are equal, round, and reactive to light.  Cardiovascular:     Rate and Rhythm: Normal rate and regular rhythm.     Heart sounds: No murmur heard. Pulmonary:     Effort: Pulmonary effort is normal. No respiratory distress.  Abdominal:     General: Bowel sounds are normal. There is no distension.     Palpations: Abdomen is soft.  Musculoskeletal:        General: Normal range of motion.     Cervical back: Normal range of motion and neck supple.  Skin:    General: Skin is warm and dry.     Findings: No erythema.  Neurological:     Mental Status: He is alert and oriented to person, place, and time.     Cranial Nerves: No cranial nerve deficit.     Motor: No abnormal muscle tone.     Coordination: Coordination normal.  Psychiatric:        Mood and Affect: Affect normal.       Labs    Latest Ref Rng & Units 09/22/2022    3:51 PM 09/22/2022    9:52 AM 09/22/2022    4:10 AM  CBC  WBC 4.0 - 10.5 K/uL 19.2  20.4  16.8   Hemoglobin 13.0 - 17.0 g/dL 6.6  8.0  9.1   Hematocrit 39.0 - 52.0 % 19.3  23.4  26.9   Platelets 150 - 400 K/uL 143  155  162       Latest Ref Rng & Units 09/22/2022    4:09 AM 09/21/2022   11:00 PM 09/21/2022    9:31 AM  CMP  Glucose 70 - 99 mg/dL 161   096   BUN 8 - 23 mg/dL 045   409   Creatinine 0.61 - 1.24 mg/dL 8.11   9.14   Sodium 782 - 145 mmol/L 144   135   Potassium 3.5 - 5.1 mmol/L 4.0  4.5  4.8  Chloride 98 - 111 mmol/L 117   106   CO2 22 - 32 mmol/L 20   14   Calcium 8.9 - 10.3 mg/dL 8.4   8.3   Total Protein 6.5 - 8.1 g/dL 5.0   4.8   Total Bilirubin 0.3 - 1.2 mg/dL 0.6   0.6   Alkaline Phos 38 - 126 U/L 33   36   AST 15 - 41 U/L 20   37   ALT 0 - 44 U/L 23   27      RADIOGRAPHIC STUDIES: I have personally reviewed the radiological images as listed and agreed with the findings in the report. CT HEAD WO CONTRAST ( )  Result Date: 09/21/2022 CLINICAL DATA:  Altered mental status EXAM: CT  HEAD WITHOUT CONTRAST TECHNIQUE: Contiguous axial images were obtained from the base of the skull through the vertex without intravenous contrast. RADIATION DOSE REDUCTION: This exam was performed according to the departmental dose-optimization program which includes automated exposure control, adjustment of the mA and/or kV according to patient size and/or use of iterative reconstruction technique. COMPARISON:  CT head 07/06/15 FINDINGS: Brain: No hemorrhage. No hydrocephalus. No extra-axial fluid collection. No CT evidence of an acute cortical infarct. No mass effect. No mass lesion. There is mild overall chronic microvascular ischemic change. Vascular: No hyperdense vessel or unexpected calcification. Skull: Normal. Negative for fracture or focal lesion. Sinuses/Orbits: No middle ear or mastoid effusion. Paranasal sinuses are clear. Orbits are unremarkable. Other: None. IMPRESSION: No acute intracranial process. No specific etiology for altered mental status identified. Electronically Signed   By: Lorenza Cambridge M.D.   On: 09/21/2022 10:12   CT Abdomen Pelvis Wo Contrast  Result Date: 09/20/2022 CLINICAL DATA:  Symptomatic anemia. Clinical suspicion for retroperitoneal hemorrhage. EXAM: CT ABDOMEN AND PELVIS WITHOUT CONTRAST TECHNIQUE: Multidetector CT imaging of the abdomen and pelvis was performed following the standard protocol without IV contrast. RADIATION DOSE REDUCTION: This exam was performed according to the departmental dose-optimization program which includes automated exposure control, adjustment of the mA and/or kV according to patient size and/or use of iterative reconstruction technique. COMPARISON:  05/13/2020 FINDINGS: Lower chest: No acute findings. Hepatobiliary: No mass visualized on this unenhanced exam. Gallbladder is unremarkable. No evidence of biliary ductal dilatation. Pancreas: No mass or inflammatory process visualized on this unenhanced exam. Spleen:  Within normal limits in size.  Adrenals/Urinary tract: No evidence of urolithiasis. No suspicious renal masses identified. Moderate to severe bilateral hydroureteronephrosis is seen to the level of the bladder, which is new since previous study. No ureteral calculi or other obstructing etiology visualized. Distended urinary bladder noted with mild diffuse wall thickening. Stomach/Bowel: No evidence of obstruction, inflammatory process, or abnormal fluid collections. Normal appendix visualized. Moderate colonic stool burden noted. Vascular/Lymphatic: No pathologically enlarged lymph nodes identified. No evidence of abdominal aortic aneurysm. Aortic atherosclerotic calcification incidentally noted. Reproductive: Moderately enlarged prostate gland, without significant change. Other:  None.  No evidence of retroperitoneal hemorrhage. Musculoskeletal:  No suspicious bone lesions identified. IMPRESSION: No evidence of retroperitoneal hemorrhage. New moderate to severe bilateral hydroureteronephrosis to the level of the bladder. No ureteral calculi or other obstructing etiology visualized. This is likely due to vesicoureteral reflux given distended urinary bladder. Distended urinary bladder with mild diffuse wall thickening, presumably due to chronic bladder outlet obstruction. Moderately enlarged prostate gland, without significant change. Moderate colonic stool burden; recommend correlation for symptoms or signs of constipation. Electronically Signed   By: Danae Orleans M.D.   On: 09/20/2022 12:49  Assessment and plan-   # Acute on chronic anemia, likely secondary to marrow suppression from chemotherapy as well as anemia due to CKD. There was plan for EGD which was canceled due to leukocytosis.  He has no signs of acute bleeding during current admission. hemoglobin has improved appropriately after 4 units of PRBC transfusion. Status post IV Venofer treatment, he tolerated well. Hold oral iron supplementation, to avoid changing of stool  color. Follow-up outpatient for additional Venofer treatments. Monitor CBC daily.  Transfuse PRBC to keep hemoglobin above 7.  # Leukocytosis, afebrile.  No clear infectious etiology.  Possibly reactive.  Monitor.  # Metastatic prostate cancer, castrate resistance, progressed while on Xtandi S/p 3 cycles of chemotherapy with docetaxel.  He did not tolerate due to recurrent severe anemia.  PSA has increased to 70s, I will repeat PSA outpatient once acute issue resolves.  # Hydronephrosis secondary to obstruction, Hematuria is likely due to trauma from Foley.  Resolving.   Urology recommendation reviewed.  I agree with starting alpha-blocker after his BP is more stabilized.  Keeping Foley catheter in place and outpatient follow-up with urology.  Thank you for allowing me to participate in the care of this patient.   Rickard Patience, MD, PhD Hematology Oncology 09/22/2022

## 2022-09-22 NOTE — Progress Notes (Signed)
       CROSS COVER NOTE  NAME: Keith Howe MRN: [id number] DOB : 11-01-1940    HPI/Events of Note   Report:nurse reports hgb on am labs 6.6 and note to transfuse if below 7  On review of chart: Patient  prostate cancer on chemo in addition to hypertension anemia  and CKD came fro infusion center secondary to unresponsiveness. Found to have bilateral hydroureteronephrosis from outlet obstruction. Foley was place and had significant hematuria.    Assessment and  Interventions   Assessment: H and h now 6.4 Plan: 1 unit PRBC transfusion ordered       Donnie Mesa NP [facility] Cross Cover 7pm-7am - check amion for availability Pager [phone]

## 2022-09-23 DIAGNOSIS — N1832 Chronic kidney disease, stage 3b: Secondary | ICD-10-CM | POA: Diagnosis not present

## 2022-09-23 DIAGNOSIS — D649 Anemia, unspecified: Secondary | ICD-10-CM | POA: Diagnosis not present

## 2022-09-23 DIAGNOSIS — C61 Malignant neoplasm of prostate: Secondary | ICD-10-CM | POA: Diagnosis not present

## 2022-09-23 LAB — HEMOGLOBIN AND HEMATOCRIT, BLOOD
HCT: 21.6 % — ABNORMAL LOW (ref 39.0–52.0)
Hemoglobin: 7.1 g/dL — ABNORMAL LOW (ref 13.0–17.0)

## 2022-09-23 LAB — CBC
HCT: 21.4 % — ABNORMAL LOW (ref 39.0–52.0)
Hemoglobin: 7.1 g/dL — ABNORMAL LOW (ref 13.0–17.0)
MCH: 30.2 pg (ref 26.0–34.0)
MCHC: 33.2 g/dL (ref 30.0–36.0)
MCV: 91.1 fL (ref 80.0–100.0)
Platelets: 118 10*3/uL — ABNORMAL LOW (ref 150–400)
RBC: 2.35 MIL/uL — ABNORMAL LOW (ref 4.22–5.81)
RDW: 16.8 % — ABNORMAL HIGH (ref 11.5–15.5)
WBC: 14.3 10*3/uL — ABNORMAL HIGH (ref 4.0–10.5)
nRBC: 2.2 % — ABNORMAL HIGH (ref 0.0–0.2)

## 2022-09-23 LAB — CBC WITH DIFFERENTIAL/PLATELET
Abs Immature Granulocytes: 0.15 10*3/uL — ABNORMAL HIGH (ref 0.00–0.07)
Basophils Absolute: 0 10*3/uL (ref 0.0–0.1)
Basophils Relative: 0 %
Eosinophils Absolute: 0 10*3/uL (ref 0.0–0.5)
Eosinophils Relative: 0 %
HCT: 23.6 % — ABNORMAL LOW (ref 39.0–52.0)
Hemoglobin: 8.1 g/dL — ABNORMAL LOW (ref 13.0–17.0)
Immature Granulocytes: 1 %
Lymphocytes Relative: 5 %
Lymphs Abs: 0.7 10*3/uL (ref 0.7–4.0)
MCH: 30.6 pg (ref 26.0–34.0)
MCHC: 34.3 g/dL (ref 30.0–36.0)
MCV: 89.1 fL (ref 80.0–100.0)
Monocytes Absolute: 1.2 10*3/uL — ABNORMAL HIGH (ref 0.1–1.0)
Monocytes Relative: 8 %
Neutro Abs: 13 10*3/uL — ABNORMAL HIGH (ref 1.7–7.7)
Neutrophils Relative %: 86 %
Platelets: 127 10*3/uL — ABNORMAL LOW (ref 150–400)
RBC: 2.65 MIL/uL — ABNORMAL LOW (ref 4.22–5.81)
RDW: 15.9 % — ABNORMAL HIGH (ref 11.5–15.5)
WBC: 15 10*3/uL — ABNORMAL HIGH (ref 4.0–10.5)
nRBC: 1.9 % — ABNORMAL HIGH (ref 0.0–0.2)

## 2022-09-23 LAB — BPAM RBC
Blood Product Expiration Date: 202406092359
Unit Type and Rh: 7300

## 2022-09-23 LAB — TYPE AND SCREEN
ABO/RH(D): B POS
Unit division: 0

## 2022-09-23 LAB — BASIC METABOLIC PANEL
Anion gap: 5 (ref 5–15)
BUN: 69 mg/dL — ABNORMAL HIGH (ref 8–23)
CO2: 19 mmol/L — ABNORMAL LOW (ref 22–32)
Calcium: 7.7 mg/dL — ABNORMAL LOW (ref 8.9–10.3)
Chloride: 121 mmol/L — ABNORMAL HIGH (ref 98–111)
Creatinine, Ser: 1.64 mg/dL — ABNORMAL HIGH (ref 0.61–1.24)
GFR, Estimated: 42 mL/min — ABNORMAL LOW (ref >60)
Glucose, Bld: 143 mg/dL — ABNORMAL HIGH (ref 70–99)
Potassium: 3.6 mmol/L (ref 3.5–5.1)
Sodium: 145 mmol/L (ref 135–145)

## 2022-09-23 LAB — GLUCOSE, CAPILLARY
Glucose-Capillary: 146 mg/dL — ABNORMAL HIGH (ref 70–99)
Glucose-Capillary: 176 mg/dL — ABNORMAL HIGH (ref 70–99)
Glucose-Capillary: 47 mg/dL — ABNORMAL LOW (ref 70–99)
Glucose-Capillary: 83 mg/dL (ref 70–99)
Glucose-Capillary: 87 mg/dL (ref 70–99)
Glucose-Capillary: 91 mg/dL (ref 70–99)
Glucose-Capillary: 91 mg/dL (ref 70–99)

## 2022-09-23 MED ORDER — MIDODRINE HCL 5 MG PO TABS
5.0000 mg | ORAL_TABLET | Freq: Two times a day (BID) | ORAL | Status: DC
  Administered 2022-09-24: 5 mg via ORAL
  Filled 2022-09-23: qty 1

## 2022-09-23 NOTE — Progress Notes (Signed)
Daily Progress Note   Patient Name: Keith Howe       Date: 09/23/2022 DOB: Jul 11, 1940  Age: 82 y.o. MRN#: [id number] Attending Physician: Charise Killian, MD Primary Care Physician: Rickard Patience, MD Admit Date: 09/21/2022  Reason for Consultation/Follow-up: Establishing goals of care  Subjective: Labs and notes reviewed.  Spoke with attending MD.  Current plans in place to continue full code/full scope treatment at this time.  Plan for patient to follow-up outpatient with oncology for further treatment.  Patient is currently up and working with PT/OT.  Length of Stay: 2  Current Medications: Scheduled Meds:   sodium chloride   Intravenous Once   Chlorhexidine Gluconate Cloth  6 each Topical Daily   cholecalciferol  1,000 Units Oral Daily   cyanocobalamin  500 mcg Oral Daily   insulin aspart  0-15 Units Subcutaneous Q4H   insulin glargine-yfgn  30 Units Subcutaneous BID   midodrine  10 mg Oral TID WC   omega-3 acid ethyl esters  1 g Oral Daily   pantoprazole (PROTONIX) IV  40 mg Intravenous Q12H   pravastatin  40 mg Oral q1800    Continuous Infusions:  sodium chloride     sodium chloride     sodium chloride 75 mL/hr at 09/23/22 0700    PRN Meds: acetaminophen, metoprolol tartrate, ondansetron (ZOFRAN) IV  Physical Exam Pulmonary:     Effort: Pulmonary effort is normal.  Neurological:     Mental Status: He is alert.             Vital Signs: BP (!) 151/68 (BP Location: Right Arm)   Pulse (!) 110   Temp 98.8 F (37.1 C) (Oral)   Resp 14   Ht 6\' 1"  (1.854 m)   Wt 105 kg   SpO2 100%   BMI 30.54 kg/m  SpO2: SpO2: 100 % O2 Device: O2 Device: Room Air O2 Flow Rate:    Intake/output summary:  Intake/Output Summary (Last 24 hours) at 09/23/2022 1232 Last data  filed at 09/23/2022 1026 Gross per 24 hour  Intake 2542.09 ml  Output 2375 ml  Net 167.09 ml   LBM: Last BM Date : 09/22/22 Baseline Weight: Weight: 105 kg Most recent weight: Weight: 105 kg   Patient Active Problem List   Diagnosis Date Noted   Anemia in  stage 3 chronic kidney disease (HCC) 09/21/2022   Chronic kidney disease, stage 3b (HCC) 09/21/2022   Leukocytosis 09/21/2022   Bilateral hydronephrosis 09/20/2022   Chemotherapy-induced neuropathy (HCC) 09/13/2022   Weight loss 08/09/2022   Syncope 07/31/2022   SIRS (systemic inflammatory response syndrome) (HCC) 07/31/2022   Obesity (BMI 30-39.9) 07/31/2022   Type II diabetes mellitus with renal manifestations (HCC) 07/31/2022   Myocardial injury 07/31/2022   Bilateral leg edema 07/31/2022   Normocytic anemia 07/31/2022   Chemotherapy induced neutropenia (HCC) 07/05/2022   Encounter for antineoplastic chemotherapy 05/26/2022   Prostate cancer (HCC) 07/13/2021   Goals of care, counseling/discussion 07/13/2021   Injury of quadriceps muscle 08/26/2020   CKD (chronic kidney disease) stage 3, GFR 30-59 ml/min (HCC) 05/27/2020   Annual physical exam 02/18/2020   Need for influenza vaccination 02/18/2020   Essential hypertension 12/15/2019   Class 1 obesity with serious comorbidity and body mass index (BMI) of 31.0 to 31.9 in adult 10/13/2019   Type 2 diabetes mellitus without complication, with long-term current use of insulin (HCC) 10/13/2019   S/P TKR (total knee replacement) using cement, left 03/19/2018    Palliative Care Assessment & Plan    Recommendations/Plan: Continue current care.  Follow-up with oncology outpatient.    Code Status:    Code Status Orders  (From admission, onward)           Start     Ordered   09/21/22 1113  Full code  Continuous       Question:  By:  Answer:  Consent: discussion documented in EHR   09/21/22 1113           Code Status History     Date Active Date Inactive  Code Status Order ID Comments User Context   07/31/2022 1524 08/02/2022 1832 Full Code [id number]  Lorretta Harp, MD ED   03/19/2018 1246 03/24/2018 1814 Full Code [id number]  Juanell Fairly, MD Inpatient       Prognosis:  Unable to determine    Care plan was discussed with primary team  Thank you for allowing the Palliative Medicine Team to assist in the care of this patient.    Morton Stall, NP  Please contact Palliative Medicine Team phone at [phone] for questions and concerns.

## 2022-09-23 NOTE — Plan of Care (Signed)
Continuing with plan of care. 

## 2022-09-23 NOTE — Progress Notes (Signed)
PROGRESS NOTE    Keith Howe  ZOX:[id number] DOB: 10/01/1940 DOA: 09/21/2022 PCP: Rickard Patience, MD  Assessment & Plan:   Principal Problem:   Anemia in stage 3 chronic kidney disease (HCC) Active Problems:   Normocytic anemia   Syncope   Essential hypertension   Prostate cancer (HCC)   Type II diabetes mellitus with renal manifestations (HCC)   Chronic kidney disease, stage 3b (HCC)   Leukocytosis   Bilateral hydronephrosis   Bilateral leg edema   Obesity (BMI 30-39.9)  Assessment and Plan: Symptomatic anemia: w/ hx of normocytic anemia. Likely secondary to delayed bone marrow recovery due to chemo as per onco. S/p 3 units of pRBCs transfused. D/c IVFs. Repeat H&H ordered. Will transfuse if Hb < 7.0   Hypotension: initially required pressors but have since been weaned off. Decrease midodrine dose & hopefully wean off. Resolved.   Syncope: CT head negative. Possibly due to volume depletion secondary to severe anemia.  HTN: holding home dose of amlodipine, losartan, metoprolol in setting of severe anemia    Prostate cancer: on chemo, last dose of chemo was on 08/23/2022. Onco recs apprec   DM2: HbA1c 6.8, well controlled. Continue on glargine, SSI w/ accuchecks   CKDIIIb: Cr is trending down from day prior. D/c IVFs    Leukocytosis: likely reactive, trending down    Bilateral hydronephrosis: CT showed new moderate to severe bilateral hydroureteronephrosis to the level of the bladder, possibly due to chronic bladder outlet obstruction. Continue w/ foley as per uro. Will need outpatient f/u w/ uro in 2-3 weeks   Bilateral leg edema: no hx of CHF. BNP is not elevated. Possibly third spacing    Obesity: BMI 30.5. Would benefit from weight loss      DVT prophylaxis: SCDs  Code Status: full  Family Communication: discussed pt's care w/ pt's wife, [name], and answered her questions  Disposition Plan: PT/OT recs home health   Level of care: Telemetry Medical  Status is:  Inpatient Remains inpatient appropriate because: severity of illness, H&H are trending down    Consultants:  ICU  Urology  Onco  Palliative care   Procedures:   Antimicrobials:   Subjective: Pt c/o malaise   Objective: Vitals:   09/23/22 0300 09/23/22 0400 09/23/22 0500 09/23/22 0600  BP: (!) 142/58 (!) 144/64 136/74 (!) 151/68  Pulse: (!) 111 (!) 114 (!) 111 (!) 110  Resp: 16 16 (!) 26 14  Temp:      TempSrc:      SpO2: 100% 100% 100% 100%  Weight:      Height:        Intake/Output Summary (Last 24 hours) at 09/23/2022 0855 Last data filed at 09/23/2022 0700 Gross per 24 hour  Intake 2976.73 ml  Output 3025 ml  Net -48.27 ml   Filed Weights   09/21/22 1107  Weight: 105 kg    Examination:  General exam: Appears comfortable  Respiratory system: clear breath sounds b/l  Cardiovascular system: S1/S2+. No rubs or clicks  Gastrointestinal system: Abd is soft, NT, obese & normal bowel sounds  Central nervous system: Alert and awake. Moves all extremities  Psychiatry: Judgement and insight appears at baseline. Flat mood and affect     Data Reviewed: I have personally reviewed following labs and imaging studies  CBC: Recent Labs  Lab 09/20/22 0849 09/21/22 0931 09/21/22 2300 09/22/22 0410 09/22/22 0952 09/22/22 1551 09/22/22 2053 09/23/22 0237  WBC 11.2*   < > 14.4* 16.8* 20.4* 19.2*  --  15.0*  NEUTROABS 10.0*  --   --   --   --   --   --  13.0*  HGB 5.7*   < > 8.8* 9.1* 8.0* 6.6* 6.4* 8.1*  HCT 18.5*   < > 26.7* 26.9* 23.4* 19.3* 19.0* 23.6*  MCV 93.0   < > 88.7 86.8 86.3 87.7  --  89.1  PLT 311   < > 146* 162 155 143*  --  127*   < > = values in this interval not displayed.   Basic Metabolic Panel: Recent Labs  Lab 09/20/22 0849 09/21/22 0931 09/21/22 2300 09/22/22 0409  NA 138 135  --  144  K 4.4 4.8 4.5 4.0  CL 106 106  --  117*  CO2 17* 14*  --  20*  GLUCOSE 258* 285*  --  134*  BUN 83* 103*  --  111*  CREATININE 1.94* 2.12*  --   1.76*  CALCIUM 8.6* 8.3*  --  8.4*  MG  --   --  2.2 2.4  PHOS  --   --  4.1 3.3   GFR: Estimated Creatinine Clearance: 41.9 mL/min (A) (by C-G formula based on SCr of 1.76 mg/dL (H)). Liver Function Tests: Recent Labs  Lab 09/20/22 0849 09/21/22 0931 09/22/22 0409  AST 34 37 20  ALT 16 27 23   ALKPHOS 36* 36* 33*  BILITOT 0.3 0.6 0.6  PROT 5.7* 4.8* 5.0*  ALBUMIN 3.1* 2.6* 2.9*   No results for input(s): "LIPASE", "AMYLASE" in the last 168 hours. No results for input(s): "AMMONIA" in the last 168 hours. Coagulation Profile: Recent Labs  Lab 09/21/22 1640  INR 1.4*   Cardiac Enzymes: No results for input(s): "CKTOTAL", "CKMB", "CKMBINDEX", "TROPONINI" in the last 168 hours. BNP (last 3 results) No results for input(s): "PROBNP" in the last 8760 hours. HbA1C: No results for input(s): "HGBA1C" in the last 72 hours. CBG: Recent Labs  Lab 09/22/22 1612 09/22/22 1915 09/22/22 2322 09/23/22 0354 09/23/22 0734  GLUCAP 135* 139* 102* 91 91   Lipid Profile: No results for input(s): "CHOL", "HDL", "LDLCALC", "TRIG", "CHOLHDL", "LDLDIRECT" in the last 72 hours. Thyroid Function Tests: No results for input(s): "TSH", "T4TOTAL", "FREET4", "T3FREE", "THYROIDAB" in the last 72 hours. Anemia Panel: Recent Labs    09/21/22 1640 09/21/22 2300  VITAMINB12  --  4,945*  RETICCTPCT 6.9*  --    Sepsis Labs: No results for input(s): "PROCALCITON", "LATICACIDVEN" in the last 168 hours.  Recent Results (from the past 240 hour(s))  MRSA Next Gen by PCR, Nasal     Status: None   Collection Time: 09/21/22 12:50 PM   Specimen: Nasal Mucosa; Nasal Swab  Result Value Ref Range Status   MRSA by PCR Next Gen NOT DETECTED NOT DETECTED Final    Comment: (NOTE) The GeneXpert MRSA Assay (FDA approved for NASAL specimens only), is one component of a comprehensive MRSA colonization surveillance program. It is not intended to diagnose MRSA infection nor to guide or monitor treatment for  MRSA infections. Test performance is not FDA approved in patients less than 41 years old. Performed at [facility], [address] [place], [place] [address]          Radiology Studies: CT HEAD WO CONTRAST ( )  Result Date: 09/21/2022 CLINICAL DATA:  Altered mental status EXAM: CT HEAD WITHOUT CONTRAST TECHNIQUE: Contiguous axial images were obtained from the base of the skull through the vertex without intravenous contrast. RADIATION DOSE REDUCTION: This exam was  performed according to the departmental dose-optimization program which includes automated exposure control, adjustment of the mA and/or kV according to patient size and/or use of iterative reconstruction technique. COMPARISON:  CT head 07/06/15 FINDINGS: Brain: No hemorrhage. No hydrocephalus. No extra-axial fluid collection. No CT evidence of an acute cortical infarct. No mass effect. No mass lesion. There is mild overall chronic microvascular ischemic change. Vascular: No hyperdense vessel or unexpected calcification. Skull: Normal. Negative for fracture or focal lesion. Sinuses/Orbits: No middle ear or mastoid effusion. Paranasal sinuses are clear. Orbits are unremarkable. Other: None. IMPRESSION: No acute intracranial process. No specific etiology for altered mental status identified. Electronically Signed   By: Lorenza Cambridge M.D.   On: 09/21/2022 10:12        Scheduled Meds:  sodium chloride   Intravenous Once   Chlorhexidine Gluconate Cloth  6 each Topical Daily   cholecalciferol  1,000 Units Oral Daily   cyanocobalamin  500 mcg Oral Daily   insulin aspart  0-15 Units Subcutaneous Q4H   insulin glargine-yfgn  30 Units Subcutaneous BID   midodrine  10 mg Oral TID WC   omega-3 acid ethyl esters  1 g Oral Daily   pantoprazole (PROTONIX) IV  40 mg Intravenous Q12H   pravastatin  40 mg Oral q1800   Continuous Infusions:  sodium chloride     sodium chloride     sodium chloride 75 mL/hr at 09/23/22 0700    iron sucrose       LOS: 2 days    Time spent: 35 mins     Charise Killian, MD [facility] Pager [phone]  If 7PM-7AM, please contact night-coverage [email or web address] 09/23/2022, 8:55 AM

## 2022-09-23 NOTE — Evaluation (Signed)
Physical Therapy Evaluation Patient Details Name: BRANDY MILUM MRN: [id number] DOB: 1941/03/04 Today's Date: 09/23/2022  History of Present Illness  LATAVIUS MCGILVERY is a 82 y.o. male with medical history significant of prostate cancer on chemotherapy, hypertension, diabetes mellitus, obesity, BPH, CKD-3B, chronic back pain, hard of hearing, anemia, who presents with syncope.  Clinical Impression  Patient received in bed sleeping. He wakes to voice/touch. Spouse at bedside. Patient is agreeable to PT/OT assessment. He is mod I for bed mobility. Transfers with supervision. Patient is able to ambulate 100 feet with RW and min guard. HR elevated to 150s with this. Patient denies dizziness. He will continue to benefit from skilled PT to improve functional independence and safety with mobility.         Recommendations for follow up therapy are one component of a multi-disciplinary discharge planning process, led by the attending physician.  Recommendations may be updated based on patient status, additional functional criteria and insurance authorization.  Follow Up Recommendations       Assistance Recommended at Discharge Frequent or constant Supervision/Assistance  Patient can return home with the following  A little help with walking and/or transfers;A little help with bathing/dressing/bathroom;Assist for transportation;Assistance with cooking/housework;Help with stairs or ramp for entrance    Equipment Recommendations None recommended by PT  Recommendations for Other Services       Functional Status Assessment Patient has had a recent decline in their functional status and demonstrates the ability to make significant improvements in function in a reasonable and predictable amount of time.     Precautions / Restrictions Precautions Precautions: Fall Restrictions Weight Bearing Restrictions: No      Mobility  Bed Mobility Overal bed mobility: Modified Independent                   Transfers Overall transfer level: Needs assistance Equipment used: Rolling walker (2 wheels) Transfers: Sit to/from Stand Sit to Stand: Supervision                Ambulation/Gait Ambulation/Gait assistance: Supervision, Min guard Gait Distance (Feet): 100 Feet Assistive device: Rolling walker (2 wheels) Gait Pattern/deviations: Step-through pattern, Decreased step length - right, Decreased step length - left, Decreased stride length Gait velocity: decr     General Gait Details: patient reports no dizziness with ambulation, however HR up to 150s with this.  Stairs            Wheelchair Mobility    Modified Rankin (Stroke Patients Only)       Balance Overall balance assessment: Needs assistance Sitting-balance support: Feet supported Sitting balance-Leahy Scale: Good     Standing balance support: Bilateral upper extremity supported, During functional activity, Reliant on assistive device for balance Standing balance-Leahy Scale: Fair Standing balance comment: min guard for safety                             Pertinent Vitals/Pain Pain Assessment Pain Assessment: No/denies pain    Home Living Family/patient expects to be discharged to:: Private residence Living Arrangements: Spouse/significant other Available Help at Discharge: Family;Available 24 hours/day Type of Home: House Home Access: Level entry     Alternate Level Stairs-Number of Steps: 3 steps without rails up to kitchen, 6 steps to bedroom with R rail, 6 steps down to basement Home Layout: Bed/bath upstairs;Multi-level;Able to live on main level with bedroom/bathroom Home Equipment: Gilmer Mor - Programmer, applications (2 wheels) Additional Comments: Can stay on lower  level -as he has been    Prior Function Prior Level of Function : Independent/Modified Independent             Mobility Comments: Pt reports he's ambulatory with QC or walker, denies falls (reports the 2-3  syncopal episodes prior to admission)       Hand Dominance        Extremity/Trunk Assessment   Upper Extremity Assessment Upper Extremity Assessment: Generalized weakness    Lower Extremity Assessment Lower Extremity Assessment: Generalized weakness    Cervical / Trunk Assessment Cervical / Trunk Assessment: Normal  Communication   Communication: HOH  Cognition Arousal/Alertness: Awake/alert Behavior During Therapy: Flat affect Overall Cognitive Status: Within Functional Limits for tasks assessed                                          General Comments      Exercises     Assessment/Plan    PT Assessment Patient needs continued PT services  PT Problem List Decreased strength;Decreased activity tolerance;Decreased mobility       PT Treatment Interventions DME instruction;Therapeutic exercise;Gait training;Stair training;Neuromuscular re-education;Functional mobility training;Therapeutic activities;Patient/family education    PT Goals (Current goals can be found in the Care Plan section)  Acute Rehab PT Goals Patient Stated Goal: to return home PT Goal Formulation: With patient Time For Goal Achievement: 10/06/22 Potential to Achieve Goals: Good    Frequency Min 3X/week     Co-evaluation PT/OT/SLP Co-Evaluation/Treatment: Yes Reason for Co-Treatment: To address functional/ADL transfers;For patient/therapist safety PT goals addressed during session: Mobility/safety with mobility;Balance;Proper use of DME         AM-PAC PT "6 Clicks" Mobility  Outcome Measure Help needed turning from your back to your side while in a flat bed without using bedrails?: None Help needed moving from lying on your back to sitting on the side of a flat bed without using bedrails?: None Help needed moving to and from a bed to a chair (including a wheelchair)?: A Little Help needed standing up from a chair using your arms (e.g., wheelchair or bedside chair)?: A  Little Help needed to walk in hospital room?: A Little Help needed climbing 3-5 steps with a railing? : A Lot 6 Click Score: 19    End of Session Equipment Utilized During Treatment: Gait belt Activity Tolerance: Patient tolerated treatment well Patient left: in chair;with call bell/phone within reach;with chair alarm set;with family/visitor present Nurse Communication: Mobility status PT Visit Diagnosis: Muscle weakness (generalized) (M62.81);Difficulty in walking, not elsewhere classified (R26.2);History of falling (Z91.81)    Time: 1610-9604 PT Time Calculation (min) (ACUTE ONLY): 14 min   Charges:   PT Evaluation $PT Eval Moderate Complexity: 1 Mod          Beckett Maden, PT, GCS 09/23/22,1:10 PM

## 2022-09-23 NOTE — Progress Notes (Signed)
Report given to receiving nurse for room 228, patient transferred in hospital bed with cardiac monitoring and in stable condition.

## 2022-09-23 NOTE — Evaluation (Signed)
Occupational Therapy Evaluation Patient Details Name: Keith Howe MRN: [id number] DOB: 1940-12-27 Today's Date: 09/23/2022   History of Present Illness Keith Howe is a 82 y.o. male with medical history significant of prostate cancer on chemotherapy, hypertension, diabetes mellitus, obesity, BPH, CKD-3B, chronic back pain, hard of hearing, anemia, who presents with syncope.   Clinical Impression   Keith Howe was seen for OT/PT co-evaluation this date. Prior to hospital admission, pt was MOD I using RW as needed. Pt lives with spouse in split level home. Pt currently requires MOD A don B socks seated EOB. SBA + RW ADL t/f ~50 ft, +2 for lines mgmt. Max HR 155 bpm during mobility, resolved in sitting. Pt would benefit from skilled OT to address noted impairments and functional limitations (see below for any additional details). Upon hospital discharge, recommend follow up therapy.   Recommendations for follow up therapy are one component of a multi-disciplinary discharge planning process, led by the attending physician.  Recommendations may be updated based on patient status, additional functional criteria and insurance authorization.   Assistance Recommended at Discharge Intermittent Supervision/Assistance  Patient can return home with the following A little help with walking and/or transfers;Help with stairs or ramp for entrance;A little help with bathing/dressing/bathroom    Functional Status Assessment  Patient has had a recent decline in their functional status and demonstrates the ability to make significant improvements in function in a reasonable and predictable amount of time.  Equipment Recommendations  BSC/3in1    Recommendations for Other Services       Precautions / Restrictions Precautions Precautions: Fall Restrictions Weight Bearing Restrictions: No      Mobility Bed Mobility Overal bed mobility: Modified Independent                   Transfers Overall transfer level: Needs assistance Equipment used: Rolling walker (2 wheels) Transfers: Sit to/from Stand Sit to Stand: Supervision                  Balance Overall balance assessment: Needs assistance Sitting-balance support: Feet supported Sitting balance-Leahy Scale: Good     Standing balance support: Bilateral upper extremity supported, During functional activity, Reliant on assistive device for balance Standing balance-Leahy Scale: Fair Standing balance comment: min guard for safety                           ADL either performed or assessed with clinical judgement   ADL Overall ADL's : Needs assistance/impaired                                       General ADL Comments: MOD A don B socks seated EOB. SBA + RW simulated toilet t/f, +2 for lines mgmt.      Pertinent Vitals/Pain Pain Assessment Pain Assessment: No/denies pain     Hand Dominance     Extremity/Trunk Assessment Upper Extremity Assessment Upper Extremity Assessment: Generalized weakness   Lower Extremity Assessment Lower Extremity Assessment: Generalized weakness   Cervical / Trunk Assessment Cervical / Trunk Assessment: Normal   Communication Communication Communication: HOH   Cognition Arousal/Alertness: Awake/alert Behavior During Therapy: Flat affect Overall Cognitive Status: Within Functional Limits for tasks assessed  Home Living Family/patient expects to be discharged to:: Private residence Living Arrangements: Spouse/significant other Available Help at Discharge: Family;Available 24 hours/day Type of Home: House Home Access: Level entry     Home Layout: Bed/bath upstairs;Multi-level;Able to live on main level with bedroom/bathroom;Laundry or work area in Artist of Steps: 2 steps without rails up to kitchen, 6 steps to bedroom with R  rail, 6 steps down to basement       Bathroom Toilet: Handicapped height     Home Equipment: Cane - Programmer, applications (2 wheels)   Additional Comments: has been staying in the basement bedroom      Prior Functioning/Environment Prior Level of Function : Independent/Modified Independent             Mobility Comments: Pt reports he's ambulatory with QC or walker, denies falls (reports the 2-3 syncopal episodes prior to admission)          OT Problem List: Decreased strength;Decreased range of motion;Decreased activity tolerance;Impaired balance (sitting and/or standing);Decreased safety awareness      OT Treatment/Interventions: Self-care/ADL training;Therapeutic exercise;Energy conservation;DME and/or AE instruction;Therapeutic activities;Patient/family education;Balance training    OT Goals(Current goals can be found in the care plan section) Acute Rehab OT Goals Patient Stated Goal: to go home OT Goal Formulation: With patient/family Time For Goal Achievement: 10/07/22 Potential to Achieve Goals: Good ADL Goals Pt Will Perform Grooming: with modified independence;standing Pt Will Perform Lower Body Dressing: with modified independence;sit to/from stand Pt Will Transfer to Toilet: with modified independence;ambulating;regular height toilet  OT Frequency: Min 2X/week    Co-evaluation PT/OT/SLP Co-Evaluation/Treatment: Yes Reason for Co-Treatment: To address functional/ADL transfers PT goals addressed during session: Mobility/safety with mobility;Balance;Proper use of DME OT goals addressed during session: ADL's and self-care      AM-PAC OT "6 Clicks" Daily Activity     Outcome Measure Help from another person eating meals?: None Help from another person taking care of personal grooming?: A Little Help from another person toileting, which includes using toliet, bedpan, or urinal?: A Little Help from another person bathing (including washing, rinsing, drying)?: A  Lot Help from another person to put on and taking off regular upper body clothing?: A Little Help from another person to put on and taking off regular lower body clothing?: A Lot 6 Click Score: 17   End of Session    Activity Tolerance: Patient tolerated treatment well Patient left: in chair;with call bell/phone within reach;with chair alarm set  OT Visit Diagnosis: Other abnormalities of gait and mobility (R26.89);Muscle weakness (generalized) (M62.81)                Time: 4098-1191 OT Time Calculation (min): 14 min Charges:  OT General Charges $OT Visit: 1 Visit OT Evaluation $OT Eval Low Complexity: 1 Low  Kathie Dike, M.S. OTR/L  09/23/22, 1:40 PM  ascom [phone]

## 2022-09-24 DIAGNOSIS — D649 Anemia, unspecified: Secondary | ICD-10-CM | POA: Diagnosis not present

## 2022-09-24 DIAGNOSIS — N1832 Chronic kidney disease, stage 3b: Secondary | ICD-10-CM | POA: Diagnosis not present

## 2022-09-24 DIAGNOSIS — C61 Malignant neoplasm of prostate: Secondary | ICD-10-CM | POA: Diagnosis not present

## 2022-09-24 LAB — TYPE AND SCREEN
ABO/RH(D): B POS
Antibody Screen: NEGATIVE
Unit division: 0
Unit division: 0

## 2022-09-24 LAB — HEMOGLOBIN AND HEMATOCRIT, BLOOD
HCT: 24 % — ABNORMAL LOW (ref 39.0–52.0)
Hemoglobin: 7.9 g/dL — ABNORMAL LOW (ref 13.0–17.0)

## 2022-09-24 LAB — BASIC METABOLIC PANEL
Anion gap: 3 — ABNORMAL LOW (ref 5–15)
BUN: 48 mg/dL — ABNORMAL HIGH (ref 8–23)
CO2: 23 mmol/L (ref 22–32)
Calcium: 7.7 mg/dL — ABNORMAL LOW (ref 8.9–10.3)
Chloride: 116 mmol/L — ABNORMAL HIGH (ref 98–111)
Creatinine, Ser: 1.49 mg/dL — ABNORMAL HIGH (ref 0.61–1.24)
GFR, Estimated: 47 mL/min — ABNORMAL LOW (ref >60)
Glucose, Bld: 170 mg/dL — ABNORMAL HIGH (ref 70–99)
Potassium: 3.6 mmol/L (ref 3.5–5.1)
Sodium: 142 mmol/L (ref 135–145)

## 2022-09-24 LAB — GLUCOSE, CAPILLARY
Glucose-Capillary: 112 mg/dL — ABNORMAL HIGH (ref 70–99)
Glucose-Capillary: 123 mg/dL — ABNORMAL HIGH (ref 70–99)
Glucose-Capillary: 131 mg/dL — ABNORMAL HIGH (ref 70–99)
Glucose-Capillary: 149 mg/dL — ABNORMAL HIGH (ref 70–99)
Glucose-Capillary: 179 mg/dL — ABNORMAL HIGH (ref 70–99)
Glucose-Capillary: 226 mg/dL — ABNORMAL HIGH (ref 70–99)
Glucose-Capillary: 45 mg/dL — ABNORMAL LOW (ref 70–99)
Glucose-Capillary: 70 mg/dL (ref 70–99)
Glucose-Capillary: 84 mg/dL (ref 70–99)

## 2022-09-24 LAB — BPAM RBC
Blood Product Expiration Date: 202406092359
Blood Product Expiration Date: 202406102359
Unit Type and Rh: 7300
Unit Type and Rh: 7300
Unit Type and Rh: 7300

## 2022-09-24 LAB — CBC
HCT: 20.2 % — ABNORMAL LOW (ref 39.0–52.0)
Hemoglobin: 6.5 g/dL — ABNORMAL LOW (ref 13.0–17.0)
MCH: 30.2 pg (ref 26.0–34.0)
MCHC: 32.2 g/dL (ref 30.0–36.0)
MCV: 94 fL (ref 80.0–100.0)
Platelets: 109 10*3/uL — ABNORMAL LOW (ref 150–400)
RBC: 2.15 MIL/uL — ABNORMAL LOW (ref 4.22–5.81)
RDW: 18.2 % — ABNORMAL HIGH (ref 11.5–15.5)
WBC: 8.8 10*3/uL (ref 4.0–10.5)
nRBC: 3.9 % — ABNORMAL HIGH (ref 0.0–0.2)

## 2022-09-24 LAB — MAGNESIUM: Magnesium: 2.1 mg/dL (ref 1.7–2.4)

## 2022-09-24 LAB — PREPARE RBC (CROSSMATCH)

## 2022-09-24 MED ORDER — MIDODRINE HCL 5 MG PO TABS
2.5000 mg | ORAL_TABLET | Freq: Two times a day (BID) | ORAL | Status: DC
  Administered 2022-09-24 – 2022-09-25 (×2): 2.5 mg via ORAL
  Filled 2022-09-24 (×2): qty 1

## 2022-09-24 MED ORDER — DEXTROSE 10 % IV SOLN: INTRAVENOUS | Status: DC

## 2022-09-24 MED ORDER — SODIUM CHLORIDE 0.9% IV SOLUTION: Freq: Once | INTRAVENOUS | Status: DC

## 2022-09-24 MED ORDER — DEXTROSE 50 % IV SOLN: 1.0000 | INTRAVENOUS | Status: AC

## 2022-09-24 NOTE — Progress Notes (Signed)
PROGRESS NOTE    Keith Howe  QVZ:[id number] DOB: 05-Nov-1940 DOA: 09/21/2022 PCP: Rickard Patience, MD  Assessment & Plan:   Principal Problem:   Anemia in stage 3 chronic kidney disease (HCC) Active Problems:   Normocytic anemia   Syncope   Essential hypertension   Prostate cancer (HCC)   Type II diabetes mellitus with renal manifestations (HCC)   Chronic kidney disease, stage 3b (HCC)   Leukocytosis   Bilateral hydronephrosis   Bilateral leg edema   Obesity (BMI 30-39.9)  Assessment and Plan: Symptomatic anemia: w/ hx of normocytic anemia. Likely secondary to delayed bone marrow recovery due to chemo as per onco. Hb 6.5 this morning so will give 1 unit more of pRBCs. Repeat H&H 4 hours post transfusion. S/p 3 units of pRBCs transfused.   Hypotension: initially required pressors but have since been weaned off. Decreasing midodrine dose again today & will continue to wean off. Resolved   Syncope: CT head negative. Possibly due to volume depletion secondary to severe anemia.  HTN: holding home dose of losartan, amlodipine, metoprolol in setting of severe anemia    Prostate cancer: on chemo, last dose of chemo was on 08/23/2022. Onco recs apprec   DM2: well controlled, HbA1c 6.8. Continue on glargine, SSI w/ accuchecks   CKDIIIb: Cr is trending down again today     Leukocytosis: resolved    Bilateral hydronephrosis: CT showed new moderate to severe bilateral hydroureteronephrosis to the level of the bladder, possibly due to chronic bladder outlet obstruction. Continue w/ foley as per uro. Will need outpatient f/u w/ uro in 2-3 weeks   Bilateral leg edema: no hx of CHF. BNP is not elevated. Possibly third spacing    Obesity: BMI 30.5. Would benefit from weight loss      DVT prophylaxis: SCDs  Code Status: full  Family Communication: discussed pt's care w/ pt's wife, [name], and answered her questions  Disposition Plan: PT/OT recs home health   Level of care: Telemetry  Medical  Status is: Inpatient Remains inpatient appropriate because: severity of illness, still requiring blood transfusions     Consultants:  ICU  Urology  Onco  Palliative care   Procedures:   Antimicrobials:   Subjective: Pt c/o fatigue   Objective: Vitals:   09/23/22 2022 09/24/22 0404 09/24/22 0639 09/24/22 0656  BP: 130/68 139/64 117/60 125/66  Pulse: (!) 105 (!) 109 (!) 105 (!) 106  Resp: 20 20 20 19   Temp: 98.2 F (36.8 C) 99 F (37.2 C) 98.6 F (37 C) 98.6 F (37 C)  TempSrc: Oral Oral Oral Oral  SpO2: 100% 100% 100% 100%  Weight:      Height:        Intake/Output Summary (Last 24 hours) at 09/24/2022 0752 Last data filed at 09/24/2022 0500 Gross per 24 hour  Intake 1166.18 ml  Output 1950 ml  Net -783.82 ml   Filed Weights   09/21/22 1107  Weight: 105 kg    Examination:  General exam: Appears calm & comfortable  Respiratory system: clear breath sounds b/l  Cardiovascular system: S1 & S2+. No rubs or clicks  Gastrointestinal system: Abd is soft, NT, obese & normal bowel sounds   Central nervous system: alert and awake. Moves all extremities  Psychiatry: Judgement and insight appears at baseline. Flat mood and affect    Data Reviewed: I have personally reviewed following labs and imaging studies  CBC: Recent Labs  Lab 09/20/22 0849 09/21/22 0931 09/22/22 0952 09/22/22 1551 09/22/22  2053 09/23/22 0237 09/23/22 1001 09/23/22 1508 09/24/22 0508  WBC 11.2*   < > 20.4* 19.2*  --  15.0* 14.3*  --  8.8  NEUTROABS 10.0*  --   --   --   --  13.0*  --   --   --   HGB 5.7*   < > 8.0* 6.6* 6.4* 8.1* 7.1* 7.1* 6.5*  HCT 18.5*   < > 23.4* 19.3* 19.0* 23.6* 21.4* 21.6* 20.2*  MCV 93.0   < > 86.3 87.7  --  89.1 91.1  --  94.0  PLT 311   < > 155 143*  --  127* 118*  --  109*   < > = values in this interval not displayed.   Basic Metabolic Panel: Recent Labs  Lab 09/20/22 0849 09/21/22 0931 09/21/22 2300 09/22/22 0409 09/23/22 0912  09/24/22 0508  NA 138 135  --  144 145 142  K 4.4 4.8 4.5 4.0 3.6 3.6  CL 106 106  --  117* 121* 116*  CO2 17* 14*  --  20* 19* 23  GLUCOSE 258* 285*  --  134* 143* 170*  BUN 83* 103*  --  111* 69* 48*  CREATININE 1.94* 2.12*  --  1.76* 1.64* 1.49*  CALCIUM 8.6* 8.3*  --  8.4* 7.7* 7.7*  MG  --   --  2.2 2.4  --  2.1  PHOS  --   --  4.1 3.3  --   --    GFR: Estimated Creatinine Clearance: 49.4 mL/min (A) (by C-G formula based on SCr of 1.49 mg/dL (H)). Liver Function Tests: Recent Labs  Lab 09/20/22 0849 09/21/22 0931 09/22/22 0409  AST 34 37 20  ALT 16 27 23   ALKPHOS 36* 36* 33*  BILITOT 0.3 0.6 0.6  PROT 5.7* 4.8* 5.0*  ALBUMIN 3.1* 2.6* 2.9*   No results for input(s): "LIPASE", "AMYLASE" in the last 168 hours. No results for input(s): "AMMONIA" in the last 168 hours. Coagulation Profile: Recent Labs  Lab 09/21/22 1640  INR 1.4*   Cardiac Enzymes: No results for input(s): "CKTOTAL", "CKMB", "CKMBINDEX", "TROPONINI" in the last 168 hours. BNP (last 3 results) No results for input(s): "PROBNP" in the last 8760 hours. HbA1C: No results for input(s): "HGBA1C" in the last 72 hours. CBG: Recent Labs  Lab 09/23/22 2358 09/24/22 0017 09/24/22 0040 09/24/22 0153 09/24/22 0405  GLUCAP 47* 45* 84 179* 226*   Lipid Profile: No results for input(s): "CHOL", "HDL", "LDLCALC", "TRIG", "CHOLHDL", "LDLDIRECT" in the last 72 hours. Thyroid Function Tests: No results for input(s): "TSH", "T4TOTAL", "FREET4", "T3FREE", "THYROIDAB" in the last 72 hours. Anemia Panel: Recent Labs    09/21/22 1640 09/21/22 2300  VITAMINB12  --  4,945*  RETICCTPCT 6.9*  --    Sepsis Labs: No results for input(s): "PROCALCITON", "LATICACIDVEN" in the last 168 hours.  Recent Results (from the past 240 hour(s))  MRSA Next Gen by PCR, Nasal     Status: None   Collection Time: 09/21/22 12:50 PM   Specimen: Nasal Mucosa; Nasal Swab  Result Value Ref Range Status   MRSA by PCR Next Gen NOT  DETECTED NOT DETECTED Final    Comment: (NOTE) The GeneXpert MRSA Assay (FDA approved for NASAL specimens only), is one component of a comprehensive MRSA colonization surveillance program. It is not intended to diagnose MRSA infection nor to guide or monitor treatment for MRSA infections. Test performance is not FDA approved in patients less than 35 years old.  Performed at [facility], [address], [place], [place] [address]          Radiology Studies: No results found.      Scheduled Meds:  sodium chloride   Intravenous Once   sodium chloride   Intravenous Once   sodium chloride   Intravenous Once   Chlorhexidine Gluconate Cloth  6 each Topical Daily   cholecalciferol  1,000 Units Oral Daily   cyanocobalamin  500 mcg Oral Daily   dextrose  1 ampule Intravenous STAT   insulin aspart  0-15 Units Subcutaneous Q4H   midodrine  5 mg Oral BID WC   omega-3 acid ethyl esters  1 g Oral Daily   pantoprazole (PROTONIX) IV  40 mg Intravenous Q12H   pravastatin  40 mg Oral q1800   Continuous Infusions:  sodium chloride     sodium chloride       LOS: 3 days    Time spent: 30 mins     Charise Killian, MD [facility] Pager [phone]  If 7PM-7AM, please contact night-coverage [email or web address] 09/24/2022, 7:52 AM

## 2022-09-24 NOTE — TOC Progression Note (Addendum)
Transition of Care [facility]) - Progression Note    Patient Details  Name: Keith Howe MRN: [id number] Date of Birth: January 04, 1941  [facility]) CM/SW Contact  Liliana Cline, LCSW Phone Number: 09/24/2022, 11:15 AM  Clinical Narrative:    Elaina Hoops with [facility] to follow up if they are active with patient. Per Reita Cliche, they are active for HHPT. Asked if OT can be added as well, Reita Cliche confirmed this can be added.        Expected Discharge Plan and Services                                               Social Determinants of Health (SDOH) Interventions SDOH Screenings   Food Insecurity: No Food Insecurity (07/31/2022)  Housing: Low Risk  (07/31/2022)  Transportation Needs: No Transportation Needs (07/31/2022)  Utilities: Not At Risk (07/31/2022)  Alcohol Screen: Low Risk  (05/12/2021)  Depression (PHQ2-9): Low Risk  (08/29/2021)  Financial Resource Strain: Low Risk  (05/12/2021)  Physical Activity: Insufficiently Active (05/12/2021)  Social Connections: Socially Integrated (05/12/2021)  Stress: No Stress Concern Present (05/12/2021)  Tobacco Use: Low Risk  (09/20/2022)    Readmission Risk Interventions     No data to display

## 2022-09-24 NOTE — Progress Notes (Addendum)
       CROSS COVER NOTE  NAME: Keith Howe MRN: [id number] DOB : 1940/06/21    HPI/Events of Note   Report:hypoglycemia 47 at 2358 - dropped to 45 after orange juice  On review of chart:patient received first dose of semglee 39 units sq at 1311    Assessment and  Interventions   Assessment:  Plan: D10 at 30 ml/h continuous until blood glucose recovers Hourly cbg until blood glucose recovers       Donnie Mesa NP [facility] Cross Cover 7pm-7am - check amion for availability Pager [phone]         CROSS COVER NOTE  NAME: Keith Howe MRN: [id number] DOB : [birth year]    HPI/Events of Note   Report:hgb 6.5  On review of chart:reviewed    Assessment and  Interventions   Assessment:  Plan: 1 unit PRBC ordered       Donnie Mesa NP [facility] Cross Cover 7pm-7am - check amion for availability Pager [phone]

## 2022-09-24 NOTE — Plan of Care (Signed)
  Problem: Coping: Goal: Ability to adjust to condition or change in health will improve Outcome: Progressing   Problem: Metabolic: Goal: Ability to maintain appropriate glucose levels will improve Outcome: Progressing   Problem: Skin Integrity: Goal: Risk for impaired skin integrity will decrease Outcome: Progressing   Problem: Tissue Perfusion: Goal: Adequacy of tissue perfusion will improve Outcome: Progressing   Problem: Education: Goal: Knowledge of General Education information will improve Description: Including pain rating scale, medication(s)/side effects and non-pharmacologic comfort measures Outcome: Progressing   Problem: Health Behavior/Discharge Planning: Goal: Ability to manage health-related needs will improve Outcome: Progressing   Problem: Nutrition: Goal: Adequate nutrition will be maintained Outcome: Progressing   Problem: Pain Managment: Goal: General experience of comfort will improve Outcome: Progressing   Problem: Safety: Goal: Ability to remain free from injury will improve Outcome: Progressing

## 2022-09-24 NOTE — Progress Notes (Signed)
Hypoglycemic event. The 8pm glucose check was 83, novolog was withheld per sliding scale and two apple juice containers were given as a precaution. Paged on call NP, requested to hold scheduled glargine 30 units for this evening. NP requested to not administer glargine at this time and reevaluate glargine necessity at midnight. At midnight, glucose was 47. NP Manuela Schwartz was paged, NP agreed to hold glargine for tonight and novolog was not given at this time. Patient was given two orange juices and a glucerna shake (glucerna shake per NP request). Recheck at 0017, BG was 45. Repaged NP, NP ordered D10 infusion @30mL /h and an amp of D50. Rechecked glucose at 0040, now at 84. D50 was not given due to normal glucose level, kept the infusion running. Did hour rechecks until glucose was normal. Recheck at 0153, was 179. At 0405, glucose level was 226, paged NP. NP discontinued D10 infusion and said to hold the recommended sliding scale dose for 4am due to recent hypoglycemic event.

## 2022-09-25 ENCOUNTER — Encounter: Payer: Self-pay | Admitting: Internal Medicine

## 2022-09-25 DIAGNOSIS — E1169 Type 2 diabetes mellitus with other specified complication: Secondary | ICD-10-CM

## 2022-09-25 DIAGNOSIS — C61 Malignant neoplasm of prostate: Secondary | ICD-10-CM | POA: Diagnosis not present

## 2022-09-25 DIAGNOSIS — D649 Anemia, unspecified: Secondary | ICD-10-CM | POA: Diagnosis not present

## 2022-09-25 LAB — BPAM RBC
Blood Product Expiration Date: 202406092359
Blood Product Expiration Date: 202406092359
Blood Product Expiration Date: 202406132359
Blood Product Expiration Date: 202406132359
ISSUE DATE / TIME: 202405161047
ISSUE DATE / TIME: 202405161722
ISSUE DATE / TIME: 202405162017
ISSUE DATE / TIME: 202405172226
ISSUE DATE / TIME: 202405190636
Unit Type and Rh: 7300
Unit Type and Rh: 7300

## 2022-09-25 LAB — CBC
HCT: 25 % — ABNORMAL LOW (ref 39.0–52.0)
Hemoglobin: 8.2 g/dL — ABNORMAL LOW (ref 13.0–17.0)
MCH: 31.1 pg (ref 26.0–34.0)
MCHC: 32.8 g/dL (ref 30.0–36.0)
MCV: 94.7 fL (ref 80.0–100.0)
Platelets: 102 10*3/uL — ABNORMAL LOW (ref 150–400)
RBC: 2.64 MIL/uL — ABNORMAL LOW (ref 4.22–5.81)
RDW: 18.2 % — ABNORMAL HIGH (ref 11.5–15.5)
WBC: 8.7 10*3/uL (ref 4.0–10.5)
nRBC: 1.3 % — ABNORMAL HIGH (ref 0.0–0.2)

## 2022-09-25 LAB — GLUCOSE, CAPILLARY
Glucose-Capillary: 128 mg/dL — ABNORMAL HIGH (ref 70–99)
Glucose-Capillary: 140 mg/dL — ABNORMAL HIGH (ref 70–99)
Glucose-Capillary: 159 mg/dL — ABNORMAL HIGH (ref 70–99)
Glucose-Capillary: 180 mg/dL — ABNORMAL HIGH (ref 70–99)
Glucose-Capillary: 227 mg/dL — ABNORMAL HIGH (ref 70–99)
Glucose-Capillary: 269 mg/dL — ABNORMAL HIGH (ref 70–99)
Glucose-Capillary: 47 mg/dL — ABNORMAL LOW (ref 70–99)
Glucose-Capillary: 75 mg/dL (ref 70–99)

## 2022-09-25 LAB — BASIC METABOLIC PANEL
Anion gap: 6 (ref 5–15)
BUN: 28 mg/dL — ABNORMAL HIGH (ref 8–23)
CO2: 22 mmol/L (ref 22–32)
Calcium: 7.7 mg/dL — ABNORMAL LOW (ref 8.9–10.3)
Chloride: 111 mmol/L (ref 98–111)
Creatinine, Ser: 1.41 mg/dL — ABNORMAL HIGH (ref 0.61–1.24)
GFR, Estimated: 50 mL/min — ABNORMAL LOW (ref >60)
Glucose, Bld: 117 mg/dL — ABNORMAL HIGH (ref 70–99)
Potassium: 3.3 mmol/L — ABNORMAL LOW (ref 3.5–5.1)
Sodium: 139 mmol/L (ref 135–145)

## 2022-09-25 LAB — TYPE AND SCREEN
Unit division: 0
Unit division: 0
Unit division: 0

## 2022-09-25 LAB — PREPARE RBC (CROSSMATCH)

## 2022-09-25 MED ORDER — INSULIN ASPART 100 UNIT/ML IJ SOLN
0.0000 [IU] | Freq: Three times a day (TID) | INTRAMUSCULAR | Status: DC
  Administered 2022-09-25: 1 [IU] via SUBCUTANEOUS
  Filled 2022-09-25: qty 1

## 2022-09-25 MED ORDER — POTASSIUM CHLORIDE CRYS ER 20 MEQ PO TBCR
40.0000 meq | EXTENDED_RELEASE_TABLET | Freq: Once | ORAL | Status: AC
  Administered 2022-09-25: 40 meq via ORAL
  Filled 2022-09-25: qty 2

## 2022-09-25 NOTE — Progress Notes (Signed)
Daily Progress Note   Patient Name: Keith Howe       Date: 09/25/2022 DOB: 05/10/1940  Age: 82 y.o. MRN#: [id number] Attending Physician: Charise Killian, MD Primary Care Physician: Rickard Patience, MD Admit Date: 09/21/2022  Reason for Consultation/Follow-up: Establishing goals of care  Subjective: Notes and labs reviewed.  In to see patient.  Patient has just finished working with therapy and is sitting in bed with wife at bedside.  He discusses being able to get up and walk some with therapy.  Patient confirms desire to follow-up with outpatient oncology.  He confirms wishes for full code/full scope treatment.    PMT will shadow for decline.  Length of Stay: 4  Current Medications: Scheduled Meds:   sodium chloride   Intravenous Once   sodium chloride   Intravenous Once   sodium chloride   Intravenous Once   Chlorhexidine Gluconate Cloth  6 each Topical Daily   cholecalciferol  1,000 Units Oral Daily   cyanocobalamin  500 mcg Oral Daily   insulin aspart  0-6 Units Subcutaneous TID WC   midodrine  2.5 mg Oral BID WC   omega-3 acid ethyl esters  1 g Oral Daily   pantoprazole (PROTONIX) IV  40 mg Intravenous Q12H   potassium chloride  40 mEq Oral Once   pravastatin  40 mg Oral q1800    Continuous Infusions:  sodium chloride     sodium chloride      PRN Meds: acetaminophen, metoprolol tartrate, ondansetron (ZOFRAN) IV  Physical Exam Pulmonary:     Effort: Pulmonary effort is normal.  Neurological:     Mental Status: He is alert.             Vital Signs: BP 139/71 (BP Location: Left Arm)   Pulse (!) 105   Temp 98.1 F (36.7 C)   Resp 16   Ht 6\' 1"  (1.854 m)   Wt 105 kg   SpO2 100%   BMI 30.54 kg/m  SpO2: SpO2: 100 % O2 Device: O2 Device: Room Air O2 Flow  Rate:    Intake/output summary:  Intake/Output Summary (Last 24 hours) at 09/25/2022 0956 Last data filed at 09/25/2022 0500 Gross per 24 hour  Intake 1422 ml  Output 3400 ml  Net -1978 ml   LBM: Last BM Date :  09/23/22 Baseline Weight: Weight: 105 kg Most recent weight: Weight: 105 kg        Patient Active Problem List   Diagnosis Date Noted   Anemia in stage 3 chronic kidney disease (HCC) 09/21/2022   Chronic kidney disease, stage 3b (HCC) 09/21/2022   Leukocytosis 09/21/2022   Bilateral hydronephrosis 09/20/2022   Chemotherapy-induced neuropathy (HCC) 09/13/2022   Weight loss 08/09/2022   Syncope 07/31/2022   SIRS (systemic inflammatory response syndrome) (HCC) 07/31/2022   Obesity (BMI 30-39.9) 07/31/2022   Type II diabetes mellitus with renal manifestations (HCC) 07/31/2022   Myocardial injury 07/31/2022   Bilateral leg edema 07/31/2022   Normocytic anemia 07/31/2022   Chemotherapy induced neutropenia (HCC) 07/05/2022   Encounter for antineoplastic chemotherapy 05/26/2022   Prostate cancer (HCC) 07/13/2021   Goals of care, counseling/discussion 07/13/2021   Injury of quadriceps muscle 08/26/2020   CKD (chronic kidney disease) stage 3, GFR 30-59 ml/min (HCC) 05/27/2020   Annual physical exam 02/18/2020   Need for influenza vaccination 02/18/2020   Essential hypertension 12/15/2019   Class 1 obesity with serious comorbidity and body mass index (BMI) of 31.0 to 31.9 in adult 10/13/2019   Type 2 diabetes mellitus without complication, with long-term current use of insulin (HCC) 10/13/2019   S/P TKR (total knee replacement) using cement, left 03/19/2018    Palliative Care Assessment & Plan    Recommendations/Plan: Full code/full scope. PMT will shadow decline   Code Status:    Code Status Orders  (From admission, onward)           Start     Ordered   09/21/22 1113  Full code  Continuous       Question:  By:  Answer:  Consent: discussion documented in  EHR   09/21/22 1113           Code Status History     Date Active Date Inactive Code Status Order ID Comments User Context   07/31/2022 1524 08/02/2022 1832 Full Code [id number]  Lorretta Harp, MD ED   03/19/2018 1246 03/24/2018 1814 Full Code [id number]  Juanell Fairly, MD Inpatient       Thank you for allowing the Palliative Medicine Team to assist in the care of this patient.    Morton Stall, NP  Please contact Palliative Medicine Team phone at [phone] for questions and concerns.

## 2022-09-25 NOTE — TOC Initial Note (Signed)
Transition of Care [facility]) - Initial/Assessment Note    Patient Details  Name: Keith Howe MRN: [id number] Date of Birth: October 27, 1940  Transition [facility]) CM/SW Contact:    Margarito Liner, LCSW Phone Number: 09/25/2022, 12:20 PM  Clinical Narrative:  Readmission prevention screen complete. CSW met with patient. Wife at bedside. CSW introduced role and explained that discharge planning would be discussed. PCP is Corky Downs, MD. Wife drives him to appointments. [facility] [place] No issues obtaining medications. Patient lives home with his wife. He is active with [facility] for PT. TOC coworker asked liaison to add OT yesterday. Patient has a RW, wheelchair, and 3-4 prong cane at home. DME recommendation for a 3-in-1 but they would like to hold off on this for now. They will ask RN to notify CSW if they decide they want one. No further concerns. CSW encouraged patient and his wife to contact CSW as needed. CSW will continue to follow patient for support and facilitate return home once stable. Wife will transport him home at discharge.               Expected Discharge Plan: Home w Home Health Services Barriers to Discharge: Continued Medical Work up   Patient Goals and CMS Choice     Choice offered to / list presented to : Patient, Spouse      Expected Discharge Plan and Services     Post Acute Care Choice: Resumption of Svcs/PTA Provider Living arrangements for the past 2 months: Single Family Home                           HH Arranged: PT, OT HH Agency: [facility] Date [facility] Agency Contacted: 09/24/22   Representative spoke with at [facility] Agency: Orvan July  Prior Living Arrangements/Services Living arrangements for the past 2 months: Single Family Home Lives with:: Spouse Patient language and need for interpreter reviewed:: Yes Do you feel safe going back to the place where you live?: Yes      Need for Family Participation in Patient Care:  Yes (Comment) Care giver support system in place?: Yes (comment) Current home services: DME, Home PT Criminal Activity/Legal Involvement Pertinent to Current Situation/Hospitalization: No - Comment as needed  Activities of Daily Living Home Assistive Devices/Equipment: Dan Humphreys (specify type) ADL Screening (condition at time of admission) Patient's cognitive ability adequate to safely complete daily activities?: Yes Is the patient deaf or have difficulty hearing?: No Does the patient have difficulty seeing, even when wearing glasses/contacts?: No Does the patient have difficulty concentrating, remembering, or making decisions?: No Patient able to express need for assistance with ADLs?: Yes Does the patient have difficulty dressing or bathing?: No Independently performs ADLs?: Yes (appropriate for developmental age) Does the patient have difficulty walking or climbing stairs?: Yes Weakness of Legs: None Weakness of Arms/Hands: None  Permission Sought/Granted Permission sought to share information with : Facility Medical sales representative, Family Supports Permission granted to share information with : Yes, Verbal Permission Granted  Share Information with NAME: Thawng Marchant  Permission granted to share info w AGENCY: [facility]  Permission granted to share info w Relationship: Wife  Permission granted to share info w Contact Information: [phone]  Emotional Assessment Appearance:: Appears stated age Attitude/Demeanor/Rapport: Engaged, Gracious Affect (typically observed): Accepting, Appropriate, Calm, Pleasant Orientation: : Oriented to Self, Oriented to Place, Oriented to  Time, Oriented to Situation Alcohol / Substance Use:  Not Applicable Psych Involvement: No (comment)  Admission diagnosis:  Generalized weakness [R53.1] Symptomatic anemia [D64.9] Anemia, unspecified type [D64.9] Patient Active Problem List   Diagnosis Date Noted   Anemia in stage 3 chronic kidney disease  (HCC) 09/21/2022   Chronic kidney disease, stage 3b (HCC) 09/21/2022   Leukocytosis 09/21/2022   Bilateral hydronephrosis 09/20/2022   Chemotherapy-induced neuropathy (HCC) 09/13/2022   Weight loss 08/09/2022   Syncope 07/31/2022   SIRS (systemic inflammatory response syndrome) (HCC) 07/31/2022   Obesity (BMI 30-39.9) 07/31/2022   Type II diabetes mellitus with renal manifestations (HCC) 07/31/2022   Myocardial injury 07/31/2022   Bilateral leg edema 07/31/2022   Normocytic anemia 07/31/2022   Chemotherapy induced neutropenia (HCC) 07/05/2022   Encounter for antineoplastic chemotherapy 05/26/2022   Prostate cancer (HCC) 07/13/2021   Goals of care, counseling/discussion 07/13/2021   Injury of quadriceps muscle 08/26/2020   CKD (chronic kidney disease) stage 3, GFR 30-59 ml/min (HCC) 05/27/2020   Annual physical exam 02/18/2020   Need for influenza vaccination 02/18/2020   Essential hypertension 12/15/2019   Class 1 obesity with serious comorbidity and body mass index (BMI) of 31.0 to 31.9 in adult 10/13/2019   Type 2 diabetes mellitus without complication, with long-term current use of insulin (HCC) 10/13/2019   S/P TKR (total knee replacement) using cement, left 03/19/2018   PCP:  Rickard Patience, MD Pharmacy:   [facility] [address] [place] - [address] [place] [address] Phone: [phone] Fax: [phone]  [facility] - [place], [place] - [address] [place] [address] Phone: [phone] Fax: [phone]  [facility], [place] - [address] [address] [place] [address] Phone: [phone] Fax: [phone]     Social Determinants of Health (SDOH) Social History: SDOH Screenings   Food Insecurity: No Food Insecurity (09/25/2022)  Housing: Low Risk  (09/25/2022)  Transportation Needs: No Transportation Needs (09/25/2022)  Utilities: Not At Risk  (09/25/2022)  Alcohol Screen: Low Risk  (05/12/2021)  Depression (PHQ2-9): Low Risk  (08/29/2021)  Financial Resource Strain: Low Risk  (05/12/2021)  Physical Activity: Insufficiently Active (05/12/2021)  Social Connections: Socially Integrated (05/12/2021)  Stress: No Stress Concern Present (05/12/2021)  Tobacco Use: Low Risk  (09/25/2022)   SDOH Interventions:     Readmission Risk Interventions    09/25/2022   12:18 PM  Readmission Risk Prevention Plan  Transportation Screening Complete  PCP or Specialist Appt within 3-5 Days Complete  HRI or Home Care Consult Complete  Social Work Consult for Recovery Care Planning/Counseling Complete  Palliative Care Screening Complete  Medication Review Oceanographer) Complete

## 2022-09-25 NOTE — Care Management Important Message (Signed)
Important Message  Patient Details  Name: Keith Howe MRN: [id number] Date of Birth: Jun 14, 1940   Medicare Important Message Given:  Yes     Johnell Comings 09/25/2022, 11:32 AM

## 2022-09-25 NOTE — Progress Notes (Signed)
Physical Therapy Treatment Patient Details Name: Keith Howe MRN: [id number] DOB: 1940-07-12 Today's Date: 09/25/2022   History of Present Illness Keith Howe is a 82 y.o. male with medical history significant of prostate cancer on chemotherapy, hypertension, diabetes mellitus, obesity, BPH, CKD-3B, chronic back pain, hard of hearing, anemia, who presents with syncope.    PT Comments    Patient alert, agreeable to PT, denied pain. Supine <> sit modI. Sit <> stand twice, progressed to supervision with RW. He ambulated ~117ft with RW and CGA initially but progressed to supervision as well. No LOB noted. Returned to room with all needs in reach. The patient would benefit from further skilled PT intervention to continue to progress towards goals.    Recommendations for follow up therapy are one component of a multi-disciplinary discharge planning process, led by the attending physician.  Recommendations may be updated based on patient status, additional functional criteria and insurance authorization.  Follow Up Recommendations       Assistance Recommended at Discharge Frequent or constant Supervision/Assistance  Patient can return home with the following A little help with walking and/or transfers;A little help with bathing/dressing/bathroom;Assist for transportation;Assistance with cooking/housework;Help with stairs or ramp for entrance   Equipment Recommendations  None recommended by PT    Recommendations for Other Services       Precautions / Restrictions Precautions Precautions: Fall Restrictions Weight Bearing Restrictions: No     Mobility  Bed Mobility Overal bed mobility: Modified Independent                  Transfers Overall transfer level: Needs assistance Equipment used: Rolling walker (2 wheels) Transfers: Sit to/from Stand Sit to Stand: Supervision                Ambulation/Gait Ambulation/Gait assistance: Supervision, Min guard Gait  Distance (Feet):  (137ft, then 150ft) Assistive device: Rolling walker (2 wheels)         General Gait Details: began with CGA, progressed to CGA, HR 110s   Stairs             Wheelchair Mobility    Modified Rankin (Stroke Patients Only)       Balance Overall balance assessment: Needs assistance Sitting-balance support: Feet supported Sitting balance-Leahy Scale: Good     Standing balance support: Bilateral upper extremity supported, During functional activity, Reliant on assistive device for balance Standing balance-Leahy Scale: Fair                              Cognition Arousal/Alertness: Awake/alert Behavior During Therapy: Flat affect Overall Cognitive Status: Within Functional Limits for tasks assessed                                          Exercises      General Comments        Pertinent Vitals/Pain Pain Assessment Pain Assessment: No/denies pain    Home Living                          Prior Function            PT Goals (current goals can now be found in the care plan section) Progress towards PT goals: Progressing toward goals    Frequency    Min 3X/week  PT Plan Current plan remains appropriate    Co-evaluation              AM-PAC PT "6 Clicks" Mobility   Outcome Measure  Help needed turning from your back to your side while in a flat bed without using bedrails?: None Help needed moving from lying on your back to sitting on the side of a flat bed without using bedrails?: None Help needed moving to and from a bed to a chair (including a wheelchair)?: None Help needed standing up from a chair using your arms (e.g., wheelchair or bedside chair)?: A Little Help needed to walk in hospital room?: A Little Help needed climbing 3-5 steps with a railing? : A Little 6 Click Score: 21    End of Session Equipment Utilized During Treatment: Gait belt   Patient left: with call  bell/phone within reach;with family/visitor present;in bed;with bed alarm set Nurse Communication: Mobility status PT Visit Diagnosis: Muscle weakness (generalized) (M62.81);Difficulty in walking, not elsewhere classified (R26.2);History of falling (Z91.81)     Time: 1610-9604 PT Time Calculation (min) (ACUTE ONLY): 17 min  Charges:  $Therapeutic Activity: 8-22 mins                     Olga Coaster PT, DPT 12:45 PM,09/25/22

## 2022-09-25 NOTE — Progress Notes (Signed)
PROGRESS NOTE    GUADALUPE NUSE  WJX:[id number] DOB: 1940/12/18 DOA: 09/21/2022 PCP: Rickard Patience, MD  Assessment & Plan:   Principal Problem:   Anemia in stage 3 chronic kidney disease (HCC) Active Problems:   Normocytic anemia   Syncope   Essential hypertension   Prostate cancer (HCC)   Type II diabetes mellitus with renal manifestations (HCC)   Chronic kidney disease, stage 3b (HCC)   Leukocytosis   Bilateral hydronephrosis   Bilateral leg edema   Obesity (BMI 30-39.9)  Assessment and Plan: Symptomatic anemia: w/ hx of normocytic anemia. Likely secondary to delayed bone marrow recovery due to chemo as per onco. S/p 4 units of pRBCs transfused. H&H are trending up today. Will continue to monitor   Hypotension: initially required pressors but have since been weaned off. D/c midodrine. Resolved   Syncope: CT head negative. Possibly due to volume depletion secondary to severe anemia.  HTN: continue to hold losartan, amlodipine, metoprolol    Prostate cancer: on chemo, last dose of chemo was on 08/23/2022. Onco recs apprec   DM2: HbA1c 6.8, well controlled. Intermittently hypoglycemic. D/c insulin and monitor blood sugar levels    CKDIIIb: Cr is w/in baseline level     Leukocytosis: resolved    Bilateral hydronephrosis: CT showed new moderate to severe bilateral hydroureteronephrosis to the level of the bladder, possibly due to chronic bladder outlet obstruction. Continue w/ foley as per uro. Will need outpatient f/u w/ uro in 2-3 weeks   Bilateral leg edema: no hx of CHF. BNP is not elevated. Possibly third spacing    Obesity: BMI 30.5. Would benefit from weight loss      DVT prophylaxis: SCDs  Code Status: full  Family Communication: discussed pt's care w/ pt's wife, [name], and answered her questions  Disposition Plan: PT/OT recs home health   Level of care: Telemetry Medical  Status is: Inpatient Remains inpatient appropriate because: severity of illness, can  likely tomorrow if H&H is trending up/stable     Consultants:  ICU  Urology  Onco  Palliative care   Procedures:   Antimicrobials:   Subjective: Pt c/o malaise   Objective: Vitals:   09/24/22 1059 09/24/22 1541 09/24/22 2033 09/25/22 0419  BP: 128/66 112/64 130/63 (!) 141/69  Pulse: 99 91 (!) 101 97  Resp: 19 18 20  (!) 22  Temp: 98.8 F (37.1 C) 98.5 F (36.9 C) 98 F (36.7 C) 99.4 F (37.4 C)  TempSrc: Oral Oral Oral Oral  SpO2: 100% 100% 100% 100%  Weight:      Height:        Intake/Output Summary (Last 24 hours) at 09/25/2022 0803 Last data filed at 09/25/2022 0500 Gross per 24 hour  Intake 1422 ml  Output 3400 ml  Net -1978 ml   Filed Weights   09/21/22 1107  Weight: 105 kg    Examination:  General exam: Appears comfortable  Respiratory system: clear breath sounds b/l Cardiovascular system: S1&S2+. No rubs or gallops  Gastrointestinal system: abd is soft, NT, obese & normal bowel sounds  Central nervous system: alert and awake. Moves all extremities  Psychiatry: judgement and insight appears at baseline. Flat mood and affect    Data Reviewed: I have personally reviewed following labs and imaging studies  CBC: Recent Labs  Lab 09/20/22 0849 09/21/22 0931 09/22/22 1551 09/22/22 2053 09/23/22 0237 09/23/22 1001 09/23/22 1508 09/24/22 0508 09/24/22 1458 09/25/22 0508  WBC 11.2*   < > 19.2*  --  15.0* 14.3*  --  8.8  --  8.7  NEUTROABS 10.0*  --   --   --  13.0*  --   --   --   --   --   HGB 5.7*   < > 6.6*   < > 8.1* 7.1* 7.1* 6.5* 7.9* 8.2*  HCT 18.5*   < > 19.3*   < > 23.6* 21.4* 21.6* 20.2* 24.0* 25.0*  MCV 93.0   < > 87.7  --  89.1 91.1  --  94.0  --  94.7  PLT 311   < > 143*  --  127* 118*  --  109*  --  102*   < > = values in this interval not displayed.   Basic Metabolic Panel: Recent Labs  Lab 09/21/22 0931 09/21/22 2300 09/22/22 0409 09/23/22 0912 09/24/22 0508 09/25/22 0508  NA 135  --  144 145 142 139  K 4.8 4.5 4.0  3.6 3.6 3.3*  CL 106  --  117* 121* 116* 111  CO2 14*  --  20* 19* 23 22  GLUCOSE 285*  --  134* 143* 170* 117*  BUN 103*  --  111* 69* 48* 28*  CREATININE 2.12*  --  1.76* 1.64* 1.49* 1.41*  CALCIUM 8.3*  --  8.4* 7.7* 7.7* 7.7*  MG  --  2.2 2.4  --  2.1  --   PHOS  --  4.1 3.3  --   --   --    GFR: Estimated Creatinine Clearance: 52.2 mL/min (A) (by C-G formula based on SCr of 1.41 mg/dL (H)). Liver Function Tests: Recent Labs  Lab 09/20/22 0849 09/21/22 0931 09/22/22 0409  AST 34 37 20  ALT 16 27 23   ALKPHOS 36* 36* 33*  BILITOT 0.3 0.6 0.6  PROT 5.7* 4.8* 5.0*  ALBUMIN 3.1* 2.6* 2.9*   No results for input(s): "LIPASE", "AMYLASE" in the last 168 hours. No results for input(s): "AMMONIA" in the last 168 hours. Coagulation Profile: Recent Labs  Lab 09/21/22 1640  INR 1.4*   Cardiac Enzymes: No results for input(s): "CKTOTAL", "CKMB", "CKMBINDEX", "TROPONINI" in the last 168 hours. BNP (last 3 results) No results for input(s): "PROBNP" in the last 8760 hours. HbA1C: No results for input(s): "HGBA1C" in the last 72 hours. CBG: Recent Labs  Lab 09/24/22 2014 09/25/22 0014 09/25/22 0405 09/25/22 0433 09/25/22 0544  GLUCAP 149* 140* 47* 75 180*   Lipid Profile: No results for input(s): "CHOL", "HDL", "LDLCALC", "TRIG", "CHOLHDL", "LDLDIRECT" in the last 72 hours. Thyroid Function Tests: No results for input(s): "TSH", "T4TOTAL", "FREET4", "T3FREE", "THYROIDAB" in the last 72 hours. Anemia Panel: No results for input(s): "VITAMINB12", "FOLATE", "FERRITIN", "TIBC", "IRON", "RETICCTPCT" in the last 72 hours.  Sepsis Labs: No results for input(s): "PROCALCITON", "LATICACIDVEN" in the last 168 hours.  Recent Results (from the past 240 hour(s))  MRSA Next Gen by PCR, Nasal     Status: None   Collection Time: 09/21/22 12:50 PM   Specimen: Nasal Mucosa; Nasal Swab  Result Value Ref Range Status   MRSA by PCR Next Gen NOT DETECTED NOT DETECTED Final    Comment:  (NOTE) The GeneXpert MRSA Assay (FDA approved for NASAL specimens only), is one component of a comprehensive MRSA colonization surveillance program. It is not intended to diagnose MRSA infection nor to guide or monitor treatment for MRSA infections. Test performance is not FDA approved in patients less than 54 years old. Performed at [facility], [address] [place], [place] [address]  Radiology Studies: No results found.      Scheduled Meds:  sodium chloride   Intravenous Once   sodium chloride   Intravenous Once   sodium chloride   Intravenous Once   Chlorhexidine Gluconate Cloth  6 each Topical Daily   cholecalciferol  1,000 Units Oral Daily   cyanocobalamin  500 mcg Oral Daily   insulin aspart  0-6 Units Subcutaneous TID WC   midodrine  2.5 mg Oral BID WC   omega-3 acid ethyl esters  1 g Oral Daily   pantoprazole (PROTONIX) IV  40 mg Intravenous Q12H   pravastatin  40 mg Oral q1800   Continuous Infusions:  sodium chloride     sodium chloride       LOS: 4 days    Time spent: 25 mins     Charise Killian, MD [facility] Pager [phone]  If 7PM-7AM, please contact night-coverage [email or web address] 09/25/2022, 8:03 AM

## 2022-09-25 NOTE — Progress Notes (Signed)
0400 CBG check. Pt sleeping, awoke when called, AO x4. Blood sugar 47 @ 0405. Pt drank 2 OJ and ate 1 vanilla ice cream, then 1 regular cola. Blood sugar was then 75 @ 0433. Pt requested orange sherbet. CBG taken 1 hr later @ 0544, blood sugar then 180. No insulin given. Next CBG due @ 0800. Bishop Limbo, NP notified. No new orders at this time.

## 2022-09-25 NOTE — Progress Notes (Signed)
Mobility Specialist - Progress Note   09/25/22 1600  Mobility  Activity Ambulated with assistance in hallway  Level of Assistance Standby assist, set-up cues, supervision of patient - no hands on  Assistive Device Front wheel walker  Distance Ambulated (ft) 280 ft  Activity Response Tolerated well  Mobility Referral Yes  $Mobility charge 1 Mobility     Pt lying in bed upon arrival, utilizing RA. Pt agreeable to activity. Completed bed mobility modI. STS and ambulation with supervision. No LOB. No complaints. Pt returned EOB + VC for slow, controlled descent to bed with bed at standard height. Pt returned supine with alarm set, needs in reach. Wife at bedside.    Filiberto Pinks Mobility Specialist 09/25/22, 4:32 PM

## 2022-09-26 ENCOUNTER — Other Ambulatory Visit: Payer: Self-pay | Admitting: Oncology

## 2022-09-26 ENCOUNTER — Other Ambulatory Visit: Payer: Self-pay

## 2022-09-26 DIAGNOSIS — D649 Anemia, unspecified: Secondary | ICD-10-CM | POA: Diagnosis not present

## 2022-09-26 DIAGNOSIS — C61 Malignant neoplasm of prostate: Secondary | ICD-10-CM

## 2022-09-26 DIAGNOSIS — N1330 Unspecified hydronephrosis: Secondary | ICD-10-CM | POA: Diagnosis not present

## 2022-09-26 LAB — CBC
HCT: 24.4 % — ABNORMAL LOW (ref 39.0–52.0)
Hemoglobin: 7.9 g/dL — ABNORMAL LOW (ref 13.0–17.0)
MCH: 31 pg (ref 26.0–34.0)
MCHC: 32.4 g/dL (ref 30.0–36.0)
MCV: 95.7 fL (ref 80.0–100.0)
Platelets: 111 10*3/uL — ABNORMAL LOW (ref 150–400)
RBC: 2.55 MIL/uL — ABNORMAL LOW (ref 4.22–5.81)
RDW: 17.9 % — ABNORMAL HIGH (ref 11.5–15.5)
WBC: 7.7 10*3/uL (ref 4.0–10.5)
nRBC: 0.4 % — ABNORMAL HIGH (ref 0.0–0.2)

## 2022-09-26 LAB — BASIC METABOLIC PANEL
Anion gap: 4 — ABNORMAL LOW (ref 5–15)
BUN: 24 mg/dL — ABNORMAL HIGH (ref 8–23)
CO2: 24 mmol/L (ref 22–32)
Calcium: 7.7 mg/dL — ABNORMAL LOW (ref 8.9–10.3)
Chloride: 108 mmol/L (ref 98–111)
Creatinine, Ser: 1.31 mg/dL — ABNORMAL HIGH (ref 0.61–1.24)
GFR, Estimated: 55 mL/min — ABNORMAL LOW (ref >60)
Glucose, Bld: 106 mg/dL — ABNORMAL HIGH (ref 70–99)
Potassium: 3.6 mmol/L (ref 3.5–5.1)
Sodium: 136 mmol/L (ref 135–145)

## 2022-09-26 LAB — GLUCOSE, CAPILLARY
Glucose-Capillary: 143 mg/dL — ABNORMAL HIGH (ref 70–99)
Glucose-Capillary: 178 mg/dL — ABNORMAL HIGH (ref 70–99)
Glucose-Capillary: 215 mg/dL — ABNORMAL HIGH (ref 70–99)
Glucose-Capillary: 86 mg/dL (ref 70–99)

## 2022-09-26 LAB — RETIC PANEL
Immature Retic Fract: 34.1 % — ABNORMAL HIGH (ref 2.3–15.9)
RBC.: 2.53 MIL/uL — ABNORMAL LOW (ref 4.22–5.81)
Retic Count, Absolute: 264.9 10*3/uL — ABNORMAL HIGH (ref 19.0–186.0)
Retic Ct Pct: 10.5 % — ABNORMAL HIGH (ref 0.4–3.1)
Reticulocyte Hemoglobin: 29.7 pg (ref >27.9)

## 2022-09-26 MED FILL — Iron Sucrose Inj 20 MG/ML (Fe Equiv): INTRAVENOUS | Qty: 10 | Status: AC

## 2022-09-26 NOTE — Progress Notes (Signed)
Mobility Specialist - Progress Note   09/26/22 1200  Mobility  Activity Transferred from bed to chair;Ambulated independently in hallway  Level of Assistance Modified independent, requires aide device or extra time  Assistive Device Front wheel walker  Distance Ambulated (ft) 140 ft  Activity Response Tolerated well  Mobility Referral Yes  $Mobility charge 1 Mobility     Pt lying in bed upon arrival, utilizing RA. Pt agreeable to activity. Completed bed mobility, STS, and ambulation modI. No complaints. No dizziness. Pt returned to recliner with needs in reach, spouse at bedside.    Filiberto Pinks Mobility Specialist 09/26/22, 12:12 PM

## 2022-09-26 NOTE — TOC Transition Note (Addendum)
Transition of Care [facility]) - CM/SW Discharge Note   Patient Details  Name: VARICK ZOTO MRN: [id number] Date of Birth: 04/16/1941  [facility] of [facility] CM/SW Contact:  Margarito Liner, LCSW Phone Number: 09/26/2022, 12:04 PM   Clinical Narrative:  Patient has orders to discharge home today. [facility] liaison is aware. No further concerns. CSW signing off.   1:08 pm: [facility] is unable to resume services until 5/29. Wife is aware and agreeable.  Final next level of care: Home w Home Health Services Barriers to Discharge: Barriers Resolved   Patient Goals and CMS Choice   Choice offered to / list presented to : Patient, Spouse  Discharge Placement                  Patient to be transferred to facility by: Wife   Patient and family notified of of transfer: 09/26/22  Discharge Plan and Services Additional resources added to the After Visit Summary for       Post Acute Care Choice: Resumption of Svcs/PTA Provider                    HH Arranged: PT, OT HH Agency: [facility] Date [facility] Agency Contacted: 09/26/22   Representative spoke with at [facility] Agency: Jerilynn Som  Social Determinants of Health (SDOH) Interventions SDOH Screenings   Food Insecurity: No Food Insecurity (09/25/2022)  Housing: Low Risk  (09/25/2022)  Transportation Needs: No Transportation Needs (09/25/2022)  Utilities: Not At Risk (09/25/2022)  Alcohol Screen: Low Risk  (05/12/2021)  Depression (PHQ2-9): Low Risk  (08/29/2021)  Financial Resource Strain: Low Risk  (05/12/2021)  Physical Activity: Insufficiently Active (05/12/2021)  Social Connections: Socially Integrated (05/12/2021)  Stress: No Stress Concern Present (05/12/2021)  Tobacco Use: Low Risk  (09/25/2022)     Readmission Risk Interventions    09/25/2022   12:18 PM  Readmission Risk Prevention Plan  Transportation Screening Complete  PCP or Specialist Appt within 3-5 Days Complete  HRI or Home Care Consult Complete  Social Work  Consult for Recovery Care Planning/Counseling Complete  Palliative Care Screening Complete  Medication Review Oceanographer) Complete

## 2022-09-26 NOTE — Progress Notes (Signed)
Discharge instructions reviewed with patient including followup visits and home medications.  Extensive time spent with foley education including teachback.  Understanding was verbalized and all questions were answered.  IV removed without complication; patient tolerated well.  Patient discharged home via wheelchair in stable condition escorted by volunteer staff.

## 2022-09-26 NOTE — Discharge Summary (Signed)
Physician Discharge Summary  Keith Howe YNW:[id number] DOB: December 20, 1940 DOA: 09/21/2022  PCP: Rickard Patience, MD  Admit date: 09/21/2022 Discharge date: 09/26/2022  Admitted From: home  Disposition:  home w/ home health   Recommendations for Outpatient Follow-up:  Follow up with PCP in 1-2 weeks Get CBC w/in 1 week to check H&H F/u w/ urology, Dr. Richardo Hanks, 6/19 at 11:30  Home Health: yes Equipment/Devices: foley & will f/u outpatient w/ urology for a voiding trial   Discharge Condition: stable  CODE STATUS: full  Diet recommendation: Heart Healthy / Carb Modified  Brief/Interim Summary: HPI was taken from Dr. Clyde Lundborg: Keith Howe is a 82 y.o. male with medical history significant of prostate cancer on chemotherapy, hypertension, diabetes mellitus, obesity, BPH, CKD-3B, chronic back pain, hard of hearing, anemia, who presents with syncope.   Per his wife at bedside, pt went to cancer center for getting 2 unit of blood transfusion due to anemia. Pt had Hgb 5.7 on 09/20/22.  Per report, while being in the elevator, pt passed out, gazing at the ceiling. His caroid and radial pulse are palpable per report. Pt regains conscious during transport to ED. When I saw pt on the floor, pt is alert oriented x 3.  Patient moves all extremities.  No facial droop or slurred speech.  Patient denies dizziness, chest pain, cough, shortness breath.  No nausea, vomiting, diarrhea or abdominal pain.  Denies symptoms of UTI.  His wife states that patient intermittently notices dark stool, but pt denies this. No rectal bleeding. Of note, patient had CT scan of abdomen/pelvis yesterday which was negative for internal hemorrhage, but showed  bilateral hydroureteronephrosis to the level of the bladder, due to possible chronic bladder outlet obstruction. Pt also had anemia panel on 09/20/22 which was not consistent with iron deficiency (iron 87 and ferritin 67).   Data reviewed independently and ED Course: pt was found to  have hemoglobin 3.9 (7.5 on 09/13/2022 -->  5.7 on 09/20/2022), WBC 12.3, worsening renal function, temperature normal, blood pressure 102/47, heart rate of 110 --> 140s, RR 23, oxygen saturation 97% on room air.  CT of head is negative. Pt is admitted to SDU as inpt. Dr. Cathie Hoops of oncology, Dr. Timothy Lasso of GI, and Dr. Richardo Hanks of urology are consulted.      CT-abd/pelvis on 09/20/22 No evidence of retroperitoneal hemorrhage.   New moderate to severe bilateral hydroureteronephrosis to the level of the bladder. No ureteral calculi or other obstructing etiology visualized. This is likely due to vesicoureteral reflux given distended urinary bladder.   Distended urinary bladder with mild diffuse wall thickening, presumably due to chronic bladder outlet obstruction.   Moderately enlarged prostate gland, without significant change.   Moderate colonic stool burden; recommend correlation for symptoms or signs of constipation.  As per Dr. Karna Christmas: 05/16: Pt admitted with new moderate to severe bilateral hydroureteronephrosis and hypotension secondary to severe acute on chronic normocytic anemia requiring blood transfusions and levophed gtt.  PCCM consulted  5/17 - patient improved with no Critical care needs identified this am. PCCM will sign off.  Vitals are markedly improved off infusions.    Discharge Diagnoses:  Principal Problem:   Anemia in stage 3 chronic kidney disease (HCC) Active Problems:   Normocytic anemia   Syncope   Essential hypertension   Prostate cancer (HCC)   Type II diabetes mellitus with renal manifestations (HCC)   Chronic kidney disease, stage 3b (HCC)   Leukocytosis   Bilateral hydronephrosis   Bilateral  leg edema   Obesity (BMI 30-39.9)  Symptomatic anemia: w/ hx of normocytic anemia. Likely secondary to delayed bone marrow recovery due to chemo as per onco. S/p 4 units of pRBCs transfused. H&H are trending up today. Will continue to monitor    Hypotension: initially  required pressors but have since been weaned off. D/c midodrine. Resolved    Syncope: CT head negative. Possibly due to volume depletion secondary to severe anemia.   HTN: restart losartan, amlodipine, metoprolol    Prostate cancer: on chemo, last dose of chemo was on 08/23/2022. Onco recs apprec    DM2: HbA1c 6.8, well controlled. Intermittently hypoglycemic. D/c insulin and monitor blood sugar levels     CKDIIIb: Cr is w/in baseline level     Leukocytosis: resolved    Bilateral hydronephrosis: CT showed new moderate to severe bilateral hydroureteronephrosis to the level of the bladder, possibly due to chronic bladder outlet obstruction. Continue w/ foley as per uro. Will need outpatient f/u w/ uro in 2-3 weeks    Bilateral leg edema: no hx of CHF. BNP is not elevated. Possibly third spacing    Obesity: BMI 30.5. Would benefit from weight loss   Discharge Instructions  Discharge Instructions     Diet - low sodium heart healthy   Complete by: As directed    Diet Carb Modified   Complete by: As directed    Discharge instructions   Complete by: As directed    F/u w/ PCP in 1-2 weeks. Get CBC w/in 1 week to check H&H levels. F/u w/ urology, Dr. Richardo Hanks on 6/19 at 11:30 here in [place]. F/u w/ onco w/ previously scheduled appointment.   Increase activity slowly   Complete by: As directed       Allergies as of 09/26/2022   No Known Allergies      Medication List     TAKE these medications    amLODipine 5 MG tablet Commonly known as: NORVASC Take 5 mg by mouth daily.   aspirin EC 81 MG tablet Take 81 mg by mouth daily.   cholecalciferol 1000 units tablet Commonly known as: VITAMIN D Take 1,000 Units by mouth daily.   cyanocobalamin 500 MCG tablet Commonly known as: VITAMIN B12 Take 500 mcg by mouth daily.   dexamethasone 4 MG tablet Commonly known as: DECADRON Take 2 tabs by mouth daily starting the day before chemo. Then take 2 tabs daily for 2 days starting  day after chemo. Take with food.   Farxiga 10 MG Tabs tablet Generic drug: dapagliflozin propanediol Take 10 mg by mouth daily.   FISH OIL PO Take 1 capsule by mouth daily.   Iron-Vitamin C 65-125 MG Tabs Take 1 tablet by mouth daily.   loratadine 10 MG tablet Commonly known as: CLARITIN Take 10 mg by mouth once daily before chemo and at least 2 days after injection.   losartan 100 MG tablet Commonly known as: COZAAR Take 100 mg by mouth daily.   lovastatin 40 MG tablet Commonly known as: MEVACOR TAKE 1 TABLET BY MOUTH EVERY DAY What changed: when to take this   metoprolol tartrate 50 MG tablet Commonly known as: LOPRESSOR TAKE 1 TABLET BY MOUTH EVERY 12 HOURS What changed: when to take this   NovoLOG FlexPen 100 UNIT/ML FlexPen Generic drug: insulin aspart Inject into the skin. Per sliding scale   ondansetron 8 MG tablet Commonly known as: Zofran Take 1 tablet (8 mg total) by mouth every 8 (eight) hours as needed for  nausea or vomiting.   prochlorperazine 10 MG tablet Commonly known as: COMPAZINE Take 1 tablet (10 mg total) by mouth every 6 (six) hours as needed for nausea or vomiting.   PROSTATE SUPPORT PO Take 2 capsules by mouth daily.   Evaristo Bury FlexTouch 200 UNIT/ML FlexTouch Pen Generic drug: insulin degludec Inject 80 Units into the muscle daily before breakfast.   Trulicity 1.5 MG/0.5ML Sopn Generic drug: Dulaglutide Inject into the skin.        Follow-up Information     Sondra Come, MD Follow up.   Specialty: Urology Why: On 6/19 at 11:30am Contact information: [address] [place] [address] [phone]                No Known Allergies  Consultations: ICU Onco Urology  Palliative care   Procedures/Studies: CT HEAD WO CONTRAST ( )  Result Date: 09/21/2022 CLINICAL DATA:  Altered mental status EXAM: CT HEAD WITHOUT CONTRAST TECHNIQUE: Contiguous axial images were obtained from the base of the skull  through the vertex without intravenous contrast. RADIATION DOSE REDUCTION: This exam was performed according to the departmental dose-optimization program which includes automated exposure control, adjustment of the mA and/or kV according to patient size and/or use of iterative reconstruction technique. COMPARISON:  CT head 07/06/15 FINDINGS: Brain: No hemorrhage. No hydrocephalus. No extra-axial fluid collection. No CT evidence of an acute cortical infarct. No mass effect. No mass lesion. There is mild overall chronic microvascular ischemic change. Vascular: No hyperdense vessel or unexpected calcification. Skull: Normal. Negative for fracture or focal lesion. Sinuses/Orbits: No middle ear or mastoid effusion. Paranasal sinuses are clear. Orbits are unremarkable. Other: None. IMPRESSION: No acute intracranial process. No specific etiology for altered mental status identified. Electronically Signed   By: Lorenza Cambridge M.D.   On: 09/21/2022 10:12   CT Abdomen Pelvis Wo Contrast  Result Date: 09/20/2022 CLINICAL DATA:  Symptomatic anemia. Clinical suspicion for retroperitoneal hemorrhage. EXAM: CT ABDOMEN AND PELVIS WITHOUT CONTRAST TECHNIQUE: Multidetector CT imaging of the abdomen and pelvis was performed following the standard protocol without IV contrast. RADIATION DOSE REDUCTION: This exam was performed according to the departmental dose-optimization program which includes automated exposure control, adjustment of the mA and/or kV according to patient size and/or use of iterative reconstruction technique. COMPARISON:  05/13/2020 FINDINGS: Lower chest: No acute findings. Hepatobiliary: No mass visualized on this unenhanced exam. Gallbladder is unremarkable. No evidence of biliary ductal dilatation. Pancreas: No mass or inflammatory process visualized on this unenhanced exam. Spleen:  Within normal limits in size. Adrenals/Urinary tract: No evidence of urolithiasis. No suspicious renal masses identified.  Moderate to severe bilateral hydroureteronephrosis is seen to the level of the bladder, which is new since previous study. No ureteral calculi or other obstructing etiology visualized. Distended urinary bladder noted with mild diffuse wall thickening. Stomach/Bowel: No evidence of obstruction, inflammatory process, or abnormal fluid collections. Normal appendix visualized. Moderate colonic stool burden noted. Vascular/Lymphatic: No pathologically enlarged lymph nodes identified. No evidence of abdominal aortic aneurysm. Aortic atherosclerotic calcification incidentally noted. Reproductive: Moderately enlarged prostate gland, without significant change. Other:  None.  No evidence of retroperitoneal hemorrhage. Musculoskeletal:  No suspicious bone lesions identified. IMPRESSION: No evidence of retroperitoneal hemorrhage. New moderate to severe bilateral hydroureteronephrosis to the level of the bladder. No ureteral calculi or other obstructing etiology visualized. This is likely due to vesicoureteral reflux given distended urinary bladder. Distended urinary bladder with mild diffuse wall thickening, presumably due to chronic bladder outlet obstruction. Moderately enlarged prostate gland,  without significant change. Moderate colonic stool burden; recommend correlation for symptoms or signs of constipation. Electronically Signed   By: Danae Orleans M.D.   On: 09/20/2022 12:49   (Echo, Carotid, EGD, Colonoscopy, ERCP)    Subjective: Pt c/o fatigue    Discharge Exam: Vitals:   09/26/22 0045 09/26/22 0645  BP: 124/73 (!) 140/80  Pulse: 91 84  Resp: 18 18  Temp: 99.1 F (37.3 C) 98.8 F (37.1 C)  SpO2: 100% 100%   Vitals:   09/25/22 0839 09/25/22 1820 09/26/22 0045 09/26/22 0645  BP: 139/71 132/66 124/73 (!) 140/80  Pulse: (!) 105 96 91 84  Resp: 16 18 18 18   Temp: 98.1 F (36.7 C) 98.4 F (36.9 C) 99.1 F (37.3 C) 98.8 F (37.1 C)  TempSrc:   Oral Oral  SpO2: 100% 100% 100% 100%  Weight:       Height:        General: Pt is alert, awake, not in acute distress Cardiovascular:  S1/S2 +, no rubs, no gallops Respiratory: CTA bilaterally, no wheezing, no rhonchi Abdominal: Soft, NT, obese, bowel sounds + Extremities: no cyanosis    The results of significant diagnostics from this hospitalization (including imaging, microbiology, ancillary and laboratory) are listed below for reference.     Microbiology: Recent Results (from the past 240 hour(s))  MRSA Next Gen by PCR, Nasal     Status: None   Collection Time: 09/21/22 12:50 PM   Specimen: Nasal Mucosa; Nasal Swab  Result Value Ref Range Status   MRSA by PCR Next Gen NOT DETECTED NOT DETECTED Final    Comment: (NOTE) The GeneXpert MRSA Assay (FDA approved for NASAL specimens only), is one component of a comprehensive MRSA colonization surveillance program. It is not intended to diagnose MRSA infection nor to guide or monitor treatment for MRSA infections. Test performance is not FDA approved in patients less than 2 years old. Performed at [facility], [address] [place], [place] [address]      Labs: BNP (last 3 results) Recent Labs    07/31/22 1209 09/21/22 1640  BNP 271.1* 80.3   Basic Metabolic Panel: Recent Labs  Lab 09/21/22 2300 09/22/22 0409 09/23/22 0912 09/24/22 0508 09/25/22 0508 09/26/22 0320  NA  --  144 145 142 139 136  K 4.5 4.0 3.6 3.6 3.3* 3.6  CL  --  117* 121* 116* 111 108  CO2  --  20* 19* 23 22 24   GLUCOSE  --  134* 143* 170* 117* 106*  BUN  --  111* 69* 48* 28* 24*  CREATININE  --  1.76* 1.64* 1.49* 1.41* 1.31*  CALCIUM  --  8.4* 7.7* 7.7* 7.7* 7.7*  MG 2.2 2.4  --  2.1  --   --   PHOS 4.1 3.3  --   --   --   --    Liver Function Tests: Recent Labs  Lab 09/20/22 0849 09/21/22 0931 09/22/22 0409  AST 34 37 20  ALT 16 27 23   ALKPHOS 36* 36* 33*  BILITOT 0.3 0.6 0.6  PROT 5.7* 4.8* 5.0*  ALBUMIN 3.1* 2.6* 2.9*   No results for input(s): "LIPASE",  "AMYLASE" in the last 168 hours. No results for input(s): "AMMONIA" in the last 168 hours. CBC: Recent Labs  Lab 09/20/22 0849 09/21/22 0931 09/23/22 0237 09/23/22 1001 09/23/22 1508 09/24/22 0508 09/24/22 1458 09/25/22 0508 09/26/22 0320  WBC 11.2*   < > 15.0* 14.3*  --  8.8  --  8.7 7.7  NEUTROABS 10.0*  --  13.0*  --   --   --   --   --   --   HGB 5.7*   < > 8.1* 7.1* 7.1* 6.5* 7.9* 8.2* 7.9*  HCT 18.5*   < > 23.6* 21.4* 21.6* 20.2* 24.0* 25.0* 24.4*  MCV 93.0   < > 89.1 91.1  --  94.0  --  94.7 95.7  PLT 311   < > 127* 118*  --  109*  --  102* 111*   < > = values in this interval not displayed.   Cardiac Enzymes: No results for input(s): "CKTOTAL", "CKMB", "CKMBINDEX", "TROPONINI" in the last 168 hours. BNP: Invalid input(s): "POCBNP" CBG: Recent Labs  Lab 09/25/22 1627 09/25/22 2045 09/26/22 0039 09/26/22 0450 09/26/22 0943  GLUCAP 269* 227* 143* 86 178*   D-Dimer No results for input(s): "DDIMER" in the last 72 hours. Hgb A1c No results for input(s): "HGBA1C" in the last 72 hours. Lipid Profile No results for input(s): "CHOL", "HDL", "LDLCALC", "TRIG", "CHOLHDL", "LDLDIRECT" in the last 72 hours. Thyroid function studies No results for input(s): "TSH", "T4TOTAL", "T3FREE", "THYROIDAB" in the last 72 hours.  Invalid input(s): "FREET3" Anemia work up No results for input(s): "VITAMINB12", "FOLATE", "FERRITIN", "TIBC", "IRON", "RETICCTPCT" in the last 72 hours. Urinalysis    Component Value Date/Time   COLORURINE RED (A) 09/21/2022 0931   APPEARANCEUR HAZY (A) 09/21/2022 0931   LABSPEC 1.016 09/21/2022 0931   PHURINE  09/21/2022 0931    TEST NOT REPORTED DUE TO COLOR INTERFERENCE OF URINE PIGMENT   GLUCOSEU (A) 09/21/2022 0931    TEST NOT REPORTED DUE TO COLOR INTERFERENCE OF URINE PIGMENT   HGBUR (A) 09/21/2022 0931    TEST NOT REPORTED DUE TO COLOR INTERFERENCE OF URINE PIGMENT   BILIRUBINUR (A) 09/21/2022 0931    TEST NOT REPORTED DUE TO COLOR  INTERFERENCE OF URINE PIGMENT   KETONESUR (A) 09/21/2022 0931    TEST NOT REPORTED DUE TO COLOR INTERFERENCE OF URINE PIGMENT   PROTEINUR (A) 09/21/2022 0931    TEST NOT REPORTED DUE TO COLOR INTERFERENCE OF URINE PIGMENT   NITRITE (A) 09/21/2022 0931    TEST NOT REPORTED DUE TO COLOR INTERFERENCE OF URINE PIGMENT   LEUKOCYTESUR (A) 09/21/2022 0931    TEST NOT REPORTED DUE TO COLOR INTERFERENCE OF URINE PIGMENT   Sepsis Labs Recent Labs  Lab 09/23/22 1001 09/24/22 0508 09/25/22 0508 09/26/22 0320  WBC 14.3* 8.8 8.7 7.7   Microbiology Recent Results (from the past 240 hour(s))  MRSA Next Gen by PCR, Nasal     Status: None   Collection Time: 09/21/22 12:50 PM   Specimen: Nasal Mucosa; Nasal Swab  Result Value Ref Range Status   MRSA by PCR Next Gen NOT DETECTED NOT DETECTED Final    Comment: (NOTE) The GeneXpert MRSA Assay (FDA approved for NASAL specimens only), is one component of a comprehensive MRSA colonization surveillance program. It is not intended to diagnose MRSA infection nor to guide or monitor treatment for MRSA infections. Test performance is not FDA approved in patients less than 32 years old. Performed at [facility], [address], [place], [place] [address]      Time coordinating discharge: Over 30 minutes  SIGNED:   Charise Killian, MD  [facility] 09/26/2022, 11:47 AM Pager   If 7PM-7AM, please contact night-coverage [email or web address]

## 2022-09-26 NOTE — Progress Notes (Signed)
Pt  Aox4 throughout the night. CBG @ 2045: 227.  No SS insulin ordered/ administered.                                                   CBG @ 0039: 143.  No SS insulin ordered/ administered.                                                   CBG @ 0450: 86.    No SS insulin ordered.   Pt drank 1 diet cola @ 0045. No other snacks or drinks other than water during the night.

## 2022-09-27 ENCOUNTER — Other Ambulatory Visit: Payer: Medicare HMO

## 2022-09-27 ENCOUNTER — Ambulatory Visit: Payer: Medicare HMO | Admitting: Medical Oncology

## 2022-09-27 ENCOUNTER — Inpatient Hospital Stay: Payer: Medicare HMO

## 2022-09-27 ENCOUNTER — Inpatient Hospital Stay (HOSPITAL_BASED_OUTPATIENT_CLINIC_OR_DEPARTMENT_OTHER): Payer: Medicare HMO | Admitting: Oncology

## 2022-09-27 ENCOUNTER — Ambulatory Visit: Payer: Medicare HMO

## 2022-09-27 ENCOUNTER — Inpatient Hospital Stay: Payer: Medicare HMO | Admitting: Medical Oncology

## 2022-09-27 ENCOUNTER — Encounter: Payer: Self-pay | Admitting: Oncology

## 2022-09-27 VITALS — BP 111/49 | HR 88

## 2022-09-27 VITALS — BP 108/52 | HR 84 | Temp 98.0°F | Resp 18 | Wt 221.9 lb

## 2022-09-27 DIAGNOSIS — C61 Malignant neoplasm of prostate: Secondary | ICD-10-CM

## 2022-09-27 DIAGNOSIS — T451X5A Adverse effect of antineoplastic and immunosuppressive drugs, initial encounter: Secondary | ICD-10-CM

## 2022-09-27 DIAGNOSIS — N1832 Chronic kidney disease, stage 3b: Secondary | ICD-10-CM | POA: Diagnosis not present

## 2022-09-27 DIAGNOSIS — G620 Drug-induced polyneuropathy: Secondary | ICD-10-CM

## 2022-09-27 DIAGNOSIS — N1330 Unspecified hydronephrosis: Secondary | ICD-10-CM | POA: Diagnosis not present

## 2022-09-27 DIAGNOSIS — E861 Hypovolemia: Secondary | ICD-10-CM

## 2022-09-27 DIAGNOSIS — Z794 Long term (current) use of insulin: Secondary | ICD-10-CM

## 2022-09-27 DIAGNOSIS — D649 Anemia, unspecified: Secondary | ICD-10-CM

## 2022-09-27 DIAGNOSIS — I959 Hypotension, unspecified: Secondary | ICD-10-CM | POA: Insufficient documentation

## 2022-09-27 DIAGNOSIS — E119 Type 2 diabetes mellitus without complications: Secondary | ICD-10-CM

## 2022-09-27 LAB — CBC WITH DIFFERENTIAL (CANCER CENTER ONLY)
Abs Immature Granulocytes: 0.06 10*3/uL (ref 0.00–0.07)
Basophils Absolute: 0 10*3/uL (ref 0.0–0.1)
Basophils Relative: 0 %
Eosinophils Absolute: 0.3 10*3/uL (ref 0.0–0.5)
Eosinophils Relative: 3 %
HCT: 28.9 % — ABNORMAL LOW (ref 39.0–52.0)
Hemoglobin: 9.3 g/dL — ABNORMAL LOW (ref 13.0–17.0)
Immature Granulocytes: 1 %
Lymphocytes Relative: 4 %
Lymphs Abs: 0.4 10*3/uL — ABNORMAL LOW (ref 0.7–4.0)
MCH: 31.1 pg (ref 26.0–34.0)
MCHC: 32.2 g/dL (ref 30.0–36.0)
MCV: 96.7 fL (ref 80.0–100.0)
Monocytes Absolute: 0.5 10*3/uL (ref 0.1–1.0)
Monocytes Relative: 5 %
Neutro Abs: 8.4 10*3/uL — ABNORMAL HIGH (ref 1.7–7.7)
Neutrophils Relative %: 87 %
Platelet Count: 148 10*3/uL — ABNORMAL LOW (ref 150–400)
RBC: 2.99 MIL/uL — ABNORMAL LOW (ref 4.22–5.81)
RDW: 17.6 % — ABNORMAL HIGH (ref 11.5–15.5)
WBC Count: 9.6 10*3/uL (ref 4.0–10.5)
nRBC: 0 % (ref 0.0–0.2)

## 2022-09-27 LAB — RETIC PANEL
Immature Retic Fract: 13.6 % (ref 2.3–15.9)
RBC.: 2.85 MIL/uL — ABNORMAL LOW (ref 4.22–5.81)
Retic Count, Absolute: 274.5 10*3/uL — ABNORMAL HIGH (ref 19.0–186.0)
Retic Ct Pct: 9.6 % — ABNORMAL HIGH (ref 0.4–3.1)
Reticulocyte Hemoglobin: 25.5 pg — ABNORMAL LOW (ref >27.9)

## 2022-09-27 LAB — SAMPLE TO BLOOD BANK

## 2022-09-27 LAB — PSA: Prostatic Specific Antigen: 119.02 ng/mL — ABNORMAL HIGH (ref 0.00–4.00)

## 2022-09-27 MED ORDER — SODIUM CHLORIDE 0.9 % IV SOLN
200.0000 mg | Freq: Once | INTRAVENOUS | Status: AC
  Administered 2022-09-27: 200 mg via INTRAVENOUS
  Filled 2022-09-27: qty 200

## 2022-09-27 MED ORDER — SODIUM CHLORIDE 0.9 % IV SOLN
Freq: Once | INTRAVENOUS | Status: AC
  Filled 2022-09-27: qty 250

## 2022-09-27 NOTE — Assessment & Plan Note (Signed)
Anemia is likely secondary to bone marrow suppression as well as anemia due to CKD. Iron panel showed ferritin less than 200. Recommend IV Venofer today. Hemoglobin has improved to 9.  Repeat CBC in 1 week.

## 2022-09-27 NOTE — Assessment & Plan Note (Signed)
Encourage oral hydration and avoid nephrotoxins.   

## 2022-09-27 NOTE — Progress Notes (Signed)
Foley cathether was drained while pt was in clinic today. Amber colored urine, no foul odor.   Output : 250 cc

## 2022-09-27 NOTE — Assessment & Plan Note (Signed)
BP was low in the clinic SBP 70s.  Likely due to hypovolemia due to decreased oral intake- did not have lunch.  IVF 1L NS x 1 today.  Recommend patient to discontinue amlodipine.  Measure blood pressure at home If SBP <110, hold off Losartan.  If SBP <100, hold off Losartan and metoprolol

## 2022-09-27 NOTE — Assessment & Plan Note (Signed)
Grade 1 Observation.  

## 2022-09-27 NOTE — Assessment & Plan Note (Addendum)
Prostate cancer, Gleason score 10, s/p RT[2022]-  castration resistance  biochemical recurrence 06/30/2021 06/15/2022-bone metastasis  ADT therapy 08/15/2022 Eligard 45mg  Q6 months.- next due in Oct 2024  Labs are reviewed and discussed with patient and wife.  Patient has received total 3 cycles of docetaxel.  He did not tolerate due to recurrent severe anemia. Recommend to discontinue docetaxel. PSA also came back further elevated, possible resistance to chemotherapy.  Repeat PSA today is pending.  Repeat PMSA Other options include [id number], radium 223,

## 2022-09-27 NOTE — Assessment & Plan Note (Signed)
He has Foley catheter.  Follow up with Urology

## 2022-09-27 NOTE — Patient Instructions (Signed)

## 2022-09-27 NOTE — Progress Notes (Signed)
Hematology/Oncology Progress note [phone] Fax:([phone]) [phone]      CHIEF COMPLAINTS/REASON FOR VISIT:  Metastatic prostate cancer  ASSESSMENT & PLAN:   Cancer Staging  Prostate cancer [facility]) Staging form: Prostate, AJCC 8th Edition - Clinical: Stage Unknown (rcTX, cNX) - Signed by Rickard Patience, MD on 07/13/2021 - Pathologic stage from 06/16/2022: Stage IVB (pNX, cM1b, Grade Group: 5) - Signed by Rickard Patience, MD on 06/16/2022   Prostate cancer [facility]) Prostate cancer, Gleason score 10, s/p RT[2022]-  castration resistance  biochemical recurrence 06/30/2021 06/15/2022-bone metastasis  ADT therapy 08/15/2022 Eligard 45mg  Q6 months.- next due in Oct 2024  Labs are reviewed and discussed with patient and wife.  Patient has received total 3 cycles of docetaxel.  He did not tolerate due to recurrent severe anemia. Recommend to discontinue docetaxel. PSA also came back further elevated, possible resistance to chemotherapy.  Repeat PSA today is pending.  Repeat PMSA Other options include 177Lu-PSMA-617, radium 223,    CKD (chronic kidney disease) stage 3, GFR 30-59 ml/min (HCC) Encourage oral hydration and avoid nephrotoxins.    Bilateral hydronephrosis He has Foley catheter.  Follow up with Urology     Chemotherapy-induced neuropathy (HCC) Grade 1 Observation.   Type 2 diabetes mellitus without complication, with long-term current use of insulin (HCC) On insulin, recommend patient to follow up with endocrinology/PCP   Hypotension BP was low in the clinic SBP 70s.  Likely due to hypovolemia due to decreased oral intake- did not have lunch.  IVF 1L NS x 1 today.  Recommend patient to discontinue amlodipine.  Measure blood pressure at home If SBP <110, hold off Losartan.  If SBP <100, hold off Losartan and metoprolol    Normocytic anemia Anemia is likely secondary to bone marrow suppression as well as anemia due to CKD. Iron panel showed ferritin less than  200. Recommend IV Venofer today. Hemoglobin has improved to 9.  Repeat CBC in 1 week.    Orders Placed This Encounter  Procedures   NM PET (PSMA) SKULL TO MID THIGH    Standing Status:   Future    Standing Expiration Date:   09/27/2023    Order Specific Question:   If indicated for the ordered procedure, I authorize the administration of a radiopharmaceutical per Radiology protocol    Answer:   Yes    Order Specific Question:   Preferred imaging location?    Answer:   [facility]    Follow up  PMSA,  1 week repeat blood work TBD  All questions were answered. The patient knows to call the clinic with any problems, questions or concerns.  Rickard Patience, MD, PhD [facility] Hematology Oncology 09/27/2022   HISTORY OF PRESENTING ILLNESS:   Keith Howe is a  82 y.o.  male presents for follow up for prostate cancer Oncology History  Prostate cancer (HCC)  02/24/2020 Tumor Marker   PSA at Dr..Wolff's office 5.7.Marland Kitchen   03/17/2020 Imaging    MRI of the prostate showed PI-RADS category 5 lesion of the left posterolateral and posteromedial peripheral zone in the base, mid gland, and apex, with involvement of the left central zone at the base. This lesion bulges the prosthetic capsular margin adjacent to the rectum, and there is a high suspicion for extraprostatic spread and early seminal vesicle invasion and likely left neurovascular bundle involvement. PI-RADS category 2 lesion in the right peripheral zone,considered PI-RADS category 2 given the nonfocal nature.. Encapsulated nodularity in the transition zone compatible with benign prostatic  hypertrophy. Prostate volume 71.50 cubic cm.    04/08/2020 Initial Diagnosis   Prostate cancer (HCC) prostate cancer history dated back to 04/08/20.  Prostate biopsy showed prostate adenocarcinoma, 12 out of 12 cores involved.  Gleason score 10 [5+5] 2 cores, Gleason 9 [5+4] 1 core, Gleason 9 [4+5] involving 8 cores.     05/13/2020 Imaging   CT  abdomen pelvis without contrast showed moderately enlarged prostate, no evidence of abdominal or pelvic metastatic disease.    05/13/2020 Imaging   bone scan whole body showed no evidence of osseous metastasis    06/04/2020 -  Radiation Therapy   patient was seen by Dr. Rushie Chestnut and underwent IMRT to prostate and pelvic nodes.  Dr. Rushie Chestnut asked Dr. Evelene Croon to give adjuvant deprivation therapy.    12/29/2020 Tumor Marker   PSA 0.07   06/22/2021 -  Chemotherapy   Orgovyx 120mg  daily Xtandi 160mg  daily   07/01/2021 Tumor Marker   PSA 4.3 Patient was referred by Dr. Rushie Chestnut to reestablish care with oncology for evaluation.  He was previously seen by me in the hematology clinic for abnormal SPEP and lost follow-up.  Oncology was not involved previously for treatment of prostate cancer.    07/13/2021 Cancer Staging   Staging form: Prostate, AJCC 8th Edition - Clinical: Stage Unknown (rcTX, cNX) - Signed by Rickard Patience, MD on 07/13/2021 Stage prefix: Recurrence   10/04/2021 Tumor Marker   PSA at Urologist office  <0.1   01/02/2022 Tumor Marker   PSA at urologist office 0.5   05/26/2022 Tumor Marker   PSA 9.59  Previous prostate cancer treatment was with Dr. Evelene Croon.  05/26/2022  Patient presented to reestablish care as Dr. Evelene Croon retired   06/15/2022 Imaging   PMSA showed Several radiotracer avid bone metastases in the left scapula, thoracic spine, and bilateral ribs. No evidence of soft tissue metastatic disease. Stable enlarged prostate, with findings of chronic bladder outlet obstruction and vesicoureteral reflux. Aortic Atherosclerosis     06/16/2022 Cancer Staging   Staging form: Prostate, AJCC 8th Edition - Pathologic stage from 06/16/2022: Stage IVB (pNX, cM1b, Grade Group: 5) - Signed by Rickard Patience, MD on 06/16/2022 Histologic grading system: 5 grade system   06/16/2022 Tumor Marker   PSA  21.45   06/28/2022 -  Chemotherapy   Patient is on Treatment Plan : PROSTATE Docetaxel (75)      07/31/2022 - 08/02/2022 Hospital Admission   Admitted due to near syncope, Hb 6.9, s/p PRBC transfusion x 2 units.  No bleeding history. SIRS, infectious work up negative. AKI on CKD.      INTERVAL HISTORY TEMARION HANLIN is a 82 y.o. male who has above history reviewed by me today presents for follow up visit for prostate cancer. S/p 3 cycles of Docetaxel with GCSF supports.  Accompanied by wife today.  Recent hospitalization due to chemotherapy-induced anemia status post multiple units of PRBC transfusion and Venofer treatments. Since discharge, patient reports feeling well.  Except that today in the clinic, patient had an episode of diaphoresis.  He denies shortness of breath, chest pain, lightheadedness.  His home flash glucometer monitor showed glucose level 187. Blood pressure was initially 108/52, repeat blood pressure showed SBP of 70s.  Patient took all his BP medication at home including Norvasc, metoprolol, losartan. His appointment was in the afternoon at 2:15.  Wife reports that they have not get a chance to have lunch yet. He felt better after drinking some orange juice.    Review of  Systems  Constitutional:  Positive for fatigue. Negative for appetite change, chills, fever and unexpected weight change.  HENT:   Negative for hearing loss and voice change.   Eyes:  Negative for eye problems and icterus.  Respiratory:  Negative for chest tightness, cough and shortness of breath.   Cardiovascular:  Negative for chest pain and leg swelling.  Gastrointestinal:  Negative for abdominal distention and abdominal pain.  Endocrine: Negative for hot flashes.  Genitourinary:  Negative for difficulty urinating, dysuria and frequency.   Musculoskeletal:  Negative for arthralgias.  Skin:  Negative for itching and rash.  Neurological:  Negative for light-headedness and numbness.  Hematological:  Negative for adenopathy. Does not bruise/bleed easily.  Psychiatric/Behavioral:  Negative for  confusion.     MEDICAL HISTORY:  Past Medical History:  Diagnosis Date   BPH (benign prostatic hyperplasia)    Cancer (HCC)    Chronic kidney disease    STAGE 3   Diabetes mellitus without complication (HCC)    HOH (hard of hearing)    AIDS   Hypertension    Inguinal hernia    Pain    CHRONIC LBP   Prostate cancer [facility])     SURGICAL HISTORY: Past Surgical History:  Procedure Laterality Date   BACK SURGERY     Lumbar   CATARACT EXTRACTION W/PHACO Right 07/10/2017   Procedure: CATARACT EXTRACTION PHACO AND INTRAOCULAR LENS PLACEMENT (IOC);  Surgeon: Galen Manila, MD;  Location: [facility] ORS;  Service: Ophthalmology;  Laterality: Right;  [place] 00:29.2 AP% 16.5 CDE 4.83 Fluid Pack Lot # [id number]   COLONOSCOPY     EYE SURGERY Bilateral    cataract extraction   HERNIA REPAIR     INGUINAL   PROSTATE BIOPSY N/A 04/08/2020   Procedure: PROSTATE BIOPSY Addison Bailey;  Surgeon: Orson Ape, MD;  Location: [facility] ORS;  Service: Urology;  Laterality: N/A;   TOTAL KNEE ARTHROPLASTY Left 03/19/2018   Procedure: TOTAL KNEE ARTHROPLASTY;  Surgeon: Juanell Fairly, MD;  Location: [facility] ORS;  Service: Orthopedics;  Laterality: Left;    SOCIAL HISTORY: Social History   Socioeconomic History   Marital status: Married    Spouse name: Not on file   Number of children: Not on file   Years of education: Not on file   Highest education level: Some college, no degree  Occupational History   Not on file  Tobacco Use   Smoking status: Never   Smokeless tobacco: Never  Vaping Use   Vaping Use: Never used  Substance and Sexual Activity   Alcohol use: No   Drug use: Never   Sexual activity: Not Currently  Other Topics Concern   Not on file  Social History Narrative   Independent at baseline.  Lives at home with his wife   Social Determinants of Health   Financial Resource Strain: Low Risk  (05/12/2021)   Overall Financial Resource Strain (CARDIA)    Difficulty of Paying Living Expenses:  Not hard at all  Food Insecurity: No Food Insecurity (09/25/2022)   Hunger Vital Sign    Worried About Running Out of Food in the Last Year: Never true    Ran Out of Food in the Last Year: Never true  Transportation Needs: No Transportation Needs (09/25/2022)   PRAPARE - Administrator, Civil Service (Medical): No    Lack of Transportation (Non-Medical): No  Physical Activity: Insufficiently Active (05/12/2021)   Exercise Vital Sign    Days of Exercise per Week: 7 days  Minutes of Exercise per Session: 10 min  Stress: No Stress Concern Present (05/12/2021)   [employer] of Occupational Health - Occupational Stress Questionnaire    Feeling of Stress : Not at all  Social Connections: Socially Integrated (05/12/2021)   Social Connection and Isolation Panel [NHANES]    Frequency of Communication with Friends and Family: More than three times a week    Frequency of Social Gatherings with Friends and Family: Once a week    Attends Religious Services: More than 4 times per year    Active Member of [employer] or Organizations: Yes    Attends Engineer, structural: More than 4 times per year    Marital Status: Married  Catering manager Violence: Not At Risk (09/25/2022)   Humiliation, Afraid, Rape, and Kick questionnaire    Fear of Current or Ex-Partner: No    Emotionally Abused: No    Physically Abused: No    Sexually Abused: No    FAMILY HISTORY: Family History  Problem Relation Age of Onset   Diabetes Father    Breast cancer Sister     ALLERGIES:  has No Known Allergies.  MEDICATIONS:  Current Outpatient Medications  Medication Sig Dispense Refill   amLODipine (NORVASC) 5 MG tablet Take 5 mg by mouth daily.     aspirin EC 81 MG tablet Take 81 mg by mouth daily.     cholecalciferol (VITAMIN D) 1000 units tablet Take 1,000 Units by mouth daily.     dapagliflozin propanediol (FARXIGA) 10 MG TABS tablet Take 10 mg by mouth daily.     dexamethasone (DECADRON) 4 MG  tablet Take 2 tabs by mouth daily starting the day before chemo. Then take 2 tabs daily for 2 days starting day after chemo. Take with food. 36 tablet 1   Iron-Vitamin C 65-125 MG TABS Take 1 tablet by mouth daily. 30 tablet 3   loratadine (CLARITIN) 10 MG tablet Take 10 mg by mouth once daily before chemo and at least 2 days after injection.     losartan (COZAAR) 100 MG tablet Take 100 mg by mouth daily.     lovastatin (MEVACOR) 40 MG tablet TAKE 1 TABLET BY MOUTH EVERY DAY (Patient taking differently: Take 40 mg by mouth at bedtime.) 90 tablet 3   metoprolol tartrate (LOPRESSOR) 50 MG tablet TAKE 1 TABLET BY MOUTH EVERY 12 HOURS (Patient taking differently: Take 50 mg by mouth 2 (two) times daily.) 180 tablet 2   Misc Natural Products (PROSTATE SUPPORT PO) Take 2 capsules by mouth daily.     NOVOLOG FLEXPEN 100 UNIT/ML FlexPen Inject into the skin. Per sliding scale  0   Omega-3 Fatty Acids (FISH OIL PO) Take 1 capsule by mouth daily.      ondansetron (ZOFRAN) 8 MG tablet Take 1 tablet (8 mg total) by mouth every 8 (eight) hours as needed for nausea or vomiting. 30 tablet 1   prochlorperazine (COMPAZINE) 10 MG tablet Take 1 tablet (10 mg total) by mouth every 6 (six) hours as needed for nausea or vomiting. 30 tablet 1   TRESIBA FLEXTOUCH 200 UNIT/ML SOPN Inject 80 Units into the muscle daily before breakfast.   2   TRULICITY 1.5 MG/0.5ML SOPN Inject into the skin.     vitamin B-12 (CYANOCOBALAMIN) 500 MCG tablet Take 500 mcg by mouth daily.     No current facility-administered medications for this visit.     PHYSICAL EXAMINATION: ECOG PERFORMANCE STATUS: 1 - Symptomatic but completely ambulatory Vitals:  09/27/22 1342  BP: (!) 108/52  Pulse: 84  Resp: 18  Temp: 98 F (36.7 C)  SpO2: 100%   Filed Weights   09/27/22 1342  Weight: 221 lb 14.4 oz (100.7 kg)     Physical Exam Constitutional:      General: He is not in acute distress. HENT:     Head: Normocephalic and atraumatic.   Eyes:     General: No scleral icterus. Cardiovascular:     Rate and Rhythm: Normal rate and regular rhythm.  Pulmonary:     Effort: Pulmonary effort is normal. No respiratory distress.     Breath sounds: No wheezing.  Abdominal:     General: Bowel sounds are normal. There is no distension.     Palpations: Abdomen is soft.  Musculoskeletal:        General: Normal range of motion.     Cervical back: Normal range of motion and neck supple.  Skin:    General: Skin is warm.     Findings: No erythema or rash.  Neurological:     Mental Status: He is alert and oriented to person, place, and time. Mental status is at baseline.     Cranial Nerves: No cranial nerve deficit.     Coordination: Coordination normal.  Psychiatric:        Mood and Affect: Mood normal.     LABORATORY DATA:  I have reviewed the data as listed    Latest Ref Rng & Units 09/27/2022    1:26 PM 09/26/2022    3:20 AM 09/25/2022    5:08 AM  CBC  WBC 4.0 - 10.5 K/uL 9.6  7.7  8.7   Hemoglobin 13.0 - 17.0 g/dL 9.3  7.9  8.2   Hematocrit 39.0 - 52.0 % 28.9  24.4  25.0   Platelets 150 - 400 K/uL 148  111  102       Latest Ref Rng & Units 09/26/2022    3:20 AM 09/25/2022    5:08 AM 09/24/2022    5:08 AM  CMP  Glucose 70 - 99 mg/dL 161  096  045   BUN 8 - 23 mg/dL 24  28  48   Creatinine 0.61 - 1.24 mg/dL 4.09  8.11  9.14   Sodium 135 - 145 mmol/L 136  139  142   Potassium 3.5 - 5.1 mmol/L 3.6  3.3  3.6   Chloride 98 - 111 mmol/L 108  111  116   CO2 22 - 32 mmol/L 24  22  23    Calcium 8.9 - 10.3 mg/dL 7.7  7.7  7.7     RADIOGRAPHIC STUDIES: I have personally reviewed the radiological images as listed and agreed with the findings in the report. CT HEAD WO CONTRAST ( )  Result Date: 09/21/2022 CLINICAL DATA:  Altered mental status EXAM: CT HEAD WITHOUT CONTRAST TECHNIQUE: Contiguous axial images were obtained from the base of the skull through the vertex without intravenous contrast. RADIATION DOSE REDUCTION:  This exam was performed according to the departmental dose-optimization program which includes automated exposure control, adjustment of the mA and/or kV according to patient size and/or use of iterative reconstruction technique. COMPARISON:  CT head 07/06/15 FINDINGS: Brain: No hemorrhage. No hydrocephalus. No extra-axial fluid collection. No CT evidence of an acute cortical infarct. No mass effect. No mass lesion. There is mild overall chronic microvascular ischemic change. Vascular: No hyperdense vessel or unexpected calcification. Skull: Normal. Negative for fracture or focal lesion. Sinuses/Orbits: No middle ear  or mastoid effusion. Paranasal sinuses are clear. Orbits are unremarkable. Other: None. IMPRESSION: No acute intracranial process. No specific etiology for altered mental status identified. Electronically Signed   By: Lorenza Cambridge M.D.   On: 09/21/2022 10:12   CT Abdomen Pelvis Wo Contrast  Result Date: 09/20/2022 CLINICAL DATA:  Symptomatic anemia. Clinical suspicion for retroperitoneal hemorrhage. EXAM: CT ABDOMEN AND PELVIS WITHOUT CONTRAST TECHNIQUE: Multidetector CT imaging of the abdomen and pelvis was performed following the standard protocol without IV contrast. RADIATION DOSE REDUCTION: This exam was performed according to the departmental dose-optimization program which includes automated exposure control, adjustment of the mA and/or kV according to patient size and/or use of iterative reconstruction technique. COMPARISON:  05/13/2020 FINDINGS: Lower chest: No acute findings. Hepatobiliary: No mass visualized on this unenhanced exam. Gallbladder is unremarkable. No evidence of biliary ductal dilatation. Pancreas: No mass or inflammatory process visualized on this unenhanced exam. Spleen:  Within normal limits in size. Adrenals/Urinary tract: No evidence of urolithiasis. No suspicious renal masses identified. Moderate to severe bilateral hydroureteronephrosis is seen to the level of the  bladder, which is new since previous study. No ureteral calculi or other obstructing etiology visualized. Distended urinary bladder noted with mild diffuse wall thickening. Stomach/Bowel: No evidence of obstruction, inflammatory process, or abnormal fluid collections. Normal appendix visualized. Moderate colonic stool burden noted. Vascular/Lymphatic: No pathologically enlarged lymph nodes identified. No evidence of abdominal aortic aneurysm. Aortic atherosclerotic calcification incidentally noted. Reproductive: Moderately enlarged prostate gland, without significant change. Other:  None.  No evidence of retroperitoneal hemorrhage. Musculoskeletal:  No suspicious bone lesions identified. IMPRESSION: No evidence of retroperitoneal hemorrhage. New moderate to severe bilateral hydroureteronephrosis to the level of the bladder. No ureteral calculi or other obstructing etiology visualized. This is likely due to vesicoureteral reflux given distended urinary bladder. Distended urinary bladder with mild diffuse wall thickening, presumably due to chronic bladder outlet obstruction. Moderately enlarged prostate gland, without significant change. Moderate colonic stool burden; recommend correlation for symptoms or signs of constipation. Electronically Signed   By: Danae Orleans M.D.   On: 09/20/2022 12:49

## 2022-09-27 NOTE — Assessment & Plan Note (Signed)
On insulin, recommend patient to follow up with endocrinology/PCP  

## 2022-09-28 ENCOUNTER — Ambulatory Visit: Payer: Medicare HMO | Admitting: Nurse Practitioner

## 2022-09-28 ENCOUNTER — Inpatient Hospital Stay: Payer: Medicare HMO

## 2022-10-03 ENCOUNTER — Ambulatory Visit: Payer: Medicare HMO

## 2022-10-04 ENCOUNTER — Telehealth: Payer: Self-pay | Admitting: *Deleted

## 2022-10-04 ENCOUNTER — Ambulatory Visit: Payer: Medicare HMO

## 2022-10-04 ENCOUNTER — Ambulatory Visit: Payer: Medicare HMO | Admitting: Oncology

## 2022-10-04 ENCOUNTER — Encounter: Payer: Self-pay | Admitting: Oncology

## 2022-10-04 ENCOUNTER — Inpatient Hospital Stay: Payer: Medicare HMO

## 2022-10-04 ENCOUNTER — Other Ambulatory Visit: Payer: Medicare HMO

## 2022-10-04 ENCOUNTER — Inpatient Hospital Stay (HOSPITAL_BASED_OUTPATIENT_CLINIC_OR_DEPARTMENT_OTHER): Payer: Medicare HMO | Admitting: Oncology

## 2022-10-04 VITALS — BP 154/56 | HR 106 | Temp 98.8°F | Wt 218.5 lb

## 2022-10-04 VITALS — BP 125/59 | HR 92

## 2022-10-04 DIAGNOSIS — N1330 Unspecified hydronephrosis: Secondary | ICD-10-CM

## 2022-10-04 DIAGNOSIS — D649 Anemia, unspecified: Secondary | ICD-10-CM

## 2022-10-04 DIAGNOSIS — G620 Drug-induced polyneuropathy: Secondary | ICD-10-CM

## 2022-10-04 DIAGNOSIS — N1832 Chronic kidney disease, stage 3b: Secondary | ICD-10-CM | POA: Diagnosis not present

## 2022-10-04 DIAGNOSIS — C61 Malignant neoplasm of prostate: Secondary | ICD-10-CM | POA: Diagnosis not present

## 2022-10-04 DIAGNOSIS — T451X5A Adverse effect of antineoplastic and immunosuppressive drugs, initial encounter: Secondary | ICD-10-CM

## 2022-10-04 LAB — CBC WITH DIFFERENTIAL (CANCER CENTER ONLY)
Abs Immature Granulocytes: 0.06 10*3/uL (ref 0.00–0.07)
Basophils Absolute: 0 10*3/uL (ref 0.0–0.1)
Basophils Relative: 0 %
Eosinophils Absolute: 0.2 10*3/uL (ref 0.0–0.5)
Eosinophils Relative: 3 %
HCT: 31.8 % — ABNORMAL LOW (ref 39.0–52.0)
Hemoglobin: 10.3 g/dL — ABNORMAL LOW (ref 13.0–17.0)
Immature Granulocytes: 1 %
Lymphocytes Relative: 6 %
Lymphs Abs: 0.4 10*3/uL — ABNORMAL LOW (ref 0.7–4.0)
MCH: 29.9 pg (ref 26.0–34.0)
MCHC: 32.4 g/dL (ref 30.0–36.0)
MCV: 92.4 fL (ref 80.0–100.0)
Monocytes Absolute: 0.5 10*3/uL (ref 0.1–1.0)
Monocytes Relative: 7 %
Neutro Abs: 5.9 10*3/uL (ref 1.7–7.7)
Neutrophils Relative %: 83 %
Platelet Count: 252 10*3/uL (ref 150–400)
RBC: 3.44 MIL/uL — ABNORMAL LOW (ref 4.22–5.81)
RDW: 15.8 % — ABNORMAL HIGH (ref 11.5–15.5)
WBC Count: 7.1 10*3/uL (ref 4.0–10.5)
nRBC: 0 % (ref 0.0–0.2)

## 2022-10-04 LAB — CMP (CANCER CENTER ONLY)
ALT: 20 U/L (ref 0–44)
AST: 21 U/L (ref 15–41)
Albumin: 3.1 g/dL — ABNORMAL LOW (ref 3.5–5.0)
Alkaline Phosphatase: 64 U/L (ref 38–126)
Anion gap: 10 (ref 5–15)
BUN: 23 mg/dL (ref 8–23)
CO2: 26 mmol/L (ref 22–32)
Calcium: 8.9 mg/dL (ref 8.9–10.3)
Chloride: 98 mmol/L (ref 98–111)
Creatinine: 1.49 mg/dL — ABNORMAL HIGH (ref 0.61–1.24)
GFR, Estimated: 47 mL/min — ABNORMAL LOW (ref >60)
Glucose, Bld: 204 mg/dL — ABNORMAL HIGH (ref 70–99)
Potassium: 4.1 mmol/L (ref 3.5–5.1)
Sodium: 134 mmol/L — ABNORMAL LOW (ref 135–145)
Total Bilirubin: 0.2 mg/dL — ABNORMAL LOW (ref 0.3–1.2)
Total Protein: 6.7 g/dL (ref 6.5–8.1)

## 2022-10-04 LAB — PSA: Prostatic Specific Antigen: 248.44 ng/mL — ABNORMAL HIGH (ref 0.00–4.00)

## 2022-10-04 LAB — SAMPLE TO BLOOD BANK

## 2022-10-04 MED ORDER — SODIUM CHLORIDE 0.9 % IV SOLN
Freq: Once | INTRAVENOUS | Status: AC
  Filled 2022-10-04: qty 250

## 2022-10-04 MED ORDER — SODIUM CHLORIDE 0.9 % IV SOLN
200.0000 mg | Freq: Once | INTRAVENOUS | Status: AC
  Administered 2022-10-04: 200 mg via INTRAVENOUS
  Filled 2022-10-04: qty 200

## 2022-10-04 NOTE — Assessment & Plan Note (Addendum)
Prostate cancer, Gleason score 10, s/p RT[2022]-  castration resistance  biochemical recurrence 06/30/2021 06/15/2022-bone metastasis  ADT therapy 08/15/2022 Eligard 45mg  Q6 months.- next due in Oct 2024  Labs are reviewed and discussed with patient and wife.  Patient has received total 3 cycles of docetaxel.  He did not tolerate due to recurrent severe anemia. In addition, PSA has not responded to treatment.  Progressively increased, now in 200s Recommend to discontinue docetaxel. Repeat PMSA-pending insurance approval. Other options include 177Lu-PSMA-617, radium 223,

## 2022-10-04 NOTE — Patient Instructions (Signed)

## 2022-10-04 NOTE — Assessment & Plan Note (Signed)
Encourage oral hydration and avoid nephrotoxins.   

## 2022-10-04 NOTE — Telephone Encounter (Signed)
Attempted to cal Keith Howe back and got message that unable to complete call and to try back later

## 2022-10-04 NOTE — Assessment & Plan Note (Signed)
He has Foley catheter.  Follow up with Urology    

## 2022-10-04 NOTE — Telephone Encounter (Signed)
Ok with PT order.   Amlodipine and Dex have been stopped  Per office visit note:  Recommend patient to discontinue amlodipine.  Measure blood pressure at home If SBP <110, hold off Losartan.  If SBP <100, hold off Losartan and metoprolol

## 2022-10-04 NOTE — Progress Notes (Signed)
Hematology/Oncology Progress note [phone] [phone]      CHIEF COMPLAINTS/REASON FOR VISIT:  Metastatic prostate cancer  ASSESSMENT & PLAN:   Cancer Staging  Prostate cancer [facility]) Staging form: Prostate, AJCC 8th Edition - Clinical: Stage Unknown (rcTX, cNX) - Signed by Rickard Patience, MD on 07/13/2021 - Pathologic stage from 06/16/2022: Stage IVB (pNX, cM1b, Grade Group: 5) - Signed by Rickard Patience, MD on 06/16/2022   Prostate cancer [facility]) Prostate cancer, Gleason score 10, s/p RT[2022]-  castration resistance  biochemical recurrence 06/30/2021 06/15/2022-bone metastasis  ADT therapy 08/15/2022 Eligard 45mg  Q6 months.- next due in Oct 2024  Labs are reviewed and discussed with patient and wife.  Patient has received total 3 cycles of docetaxel.  He did not tolerate due to recurrent severe anemia. In addition, PSA has not responded to treatment.  Progressively increased, now in 200s Recommend to discontinue docetaxel. Repeat PMSA-pending insurance approval. Other options include [id number], radium 223,    CKD (chronic kidney disease) stage 3, GFR 30-59 ml/min (HCC) Encourage oral hydration and avoid nephrotoxins.    Normocytic anemia Anemia is likely secondary to bone marrow suppression as well as anemia due to CKD. Iron panel showed ferritin less than 200. Hemoglobin has improved above 10, hold off erythropoietin replacement therapy. Recommend IV Venofer today.   Chemotherapy-induced neuropathy (HCC) Grade 1 Observation.  Bilateral hydronephrosis He has Foley catheter.  Follow up with Urology       Orders Placed This Encounter  Procedures   CBC with Differential (Cancer Center Only)    Standing Status:   Future    Standing Expiration Date:   10/04/2023   CMP (Cancer Center only)    Standing Status:   Future    Standing Expiration Date:   10/04/2023   Iron and TIBC    Standing Status:   Future    Standing Expiration Date:   10/04/2023    Ferritin    Standing Status:   Future    Standing Expiration Date:   10/04/2023   PSA    Standing Status:   Future    Standing Expiration Date:   10/04/2023    Follow up  1 month  All questions were answered. The patient knows to call the clinic with any problems, questions or concerns.  Rickard Patience, MD, PhD [facility] Hematology Oncology 10/04/2022   HISTORY OF PRESENTING ILLNESS:   DEMTRIUS Howe is a  82 y.o.  male presents for follow up for prostate cancer Oncology History  Prostate cancer (HCC)  02/24/2020 Tumor Marker   PSA at Dr..Wolff's office 5.7.Marland Kitchen   03/17/2020 Imaging    MRI of the prostate showed PI-RADS category 5 lesion of the left posterolateral and posteromedial peripheral zone in the base, mid gland, and apex, with involvement of the left central zone at the base. This lesion bulges the prosthetic capsular margin adjacent to the rectum, and there is a high suspicion for extraprostatic spread and early seminal vesicle invasion and likely left neurovascular bundle involvement. PI-RADS category 2 lesion in the right peripheral zone,considered PI-RADS category 2 given the nonfocal nature.. Encapsulated nodularity in the transition zone compatible with benign prostatic hypertrophy. Prostate volume 71.50 cubic cm.    04/08/2020 Initial Diagnosis   Prostate cancer (HCC) prostate cancer history dated back to 04/08/20.  Prostate biopsy showed prostate adenocarcinoma, 12 out of 12 cores involved.  Gleason score 10 [5+5] 2 cores, Gleason 9 [5+4] 1 core, Gleason 9 [4+5] involving 8 cores.  05/13/2020 Imaging   CT abdomen pelvis without contrast showed moderately enlarged prostate, no evidence of abdominal or pelvic metastatic disease.    05/13/2020 Imaging   bone scan whole body showed no evidence of osseous metastasis    06/04/2020 -  Radiation Therapy   patient was seen by Dr. Rushie Chestnut and underwent IMRT to prostate and pelvic nodes.  Dr. Rushie Chestnut asked Dr. Evelene Croon to give  adjuvant deprivation therapy.    12/29/2020 Tumor Marker   PSA 0.07   06/22/2021 -  Chemotherapy   Orgovyx 120mg  daily Xtandi 160mg  daily   07/01/2021 Tumor Marker   PSA 4.3 Patient was referred by Dr. Rushie Chestnut to reestablish care with oncology for evaluation.  He was previously seen by me in the hematology clinic for abnormal SPEP and lost follow-up.  Oncology was not involved previously for treatment of prostate cancer.    07/13/2021 Cancer Staging   Staging form: Prostate, AJCC 8th Edition - Clinical: Stage Unknown (rcTX, cNX) - Signed by Rickard Patience, MD on 07/13/2021 Stage prefix: Recurrence   10/04/2021 Tumor Marker   PSA at Urologist office  <0.1   01/02/2022 Tumor Marker   PSA at urologist office 0.5   05/26/2022 Tumor Marker   PSA 9.59  Previous prostate cancer treatment was with Dr. Evelene Croon.  05/26/2022  Patient presented to reestablish care as Dr. Evelene Croon retired   06/15/2022 Imaging   PMSA showed Several radiotracer avid bone metastases in the left scapula, thoracic spine, and bilateral ribs. No evidence of soft tissue metastatic disease. Stable enlarged prostate, with findings of chronic bladder outlet obstruction and vesicoureteral reflux. Aortic Atherosclerosis     06/16/2022 Cancer Staging   Staging form: Prostate, AJCC 8th Edition - Pathologic stage from 06/16/2022: Stage IVB (pNX, cM1b, Grade Group: 5) - Signed by Rickard Patience, MD on 06/16/2022 Histologic grading system: 5 grade system   06/16/2022 Tumor Marker   PSA  21.45   06/28/2022 - 08/25/2022 Chemotherapy   Patient is on Treatment Plan : PROSTATE Docetaxel (75)     07/31/2022 - 08/02/2022 Hospital Admission   Admitted due to near syncope, Hb 6.9, s/p PRBC transfusion x 2 units.  No bleeding history. SIRS, infectious work up negative. AKI on CKD.      INTERVAL HISTORY Keith Howe is a 82 y.o. male who has above history reviewed by me today presents for follow up visit for prostate cancer. Today patient reports feeling  well.  Blood pressure has improved.  Fatigue level has improved.  Appetite is fair.  Weight is stable.    Review of Systems  Constitutional:  Positive for fatigue. Negative for appetite change, chills, fever and unexpected weight change.  HENT:   Negative for hearing loss and voice change.   Eyes:  Negative for eye problems and icterus.  Respiratory:  Negative for chest tightness, cough and shortness of breath.   Cardiovascular:  Negative for chest pain and leg swelling.  Gastrointestinal:  Negative for abdominal distention and abdominal pain.  Endocrine: Negative for hot flashes.  Genitourinary:  Negative for difficulty urinating, dysuria and frequency.   Musculoskeletal:  Negative for arthralgias.  Skin:  Negative for itching and rash.  Neurological:  Negative for light-headedness and numbness.  Hematological:  Negative for adenopathy. Does not bruise/bleed easily.  Psychiatric/Behavioral:  Negative for confusion.     MEDICAL HISTORY:  Past Medical History:  Diagnosis Date   BPH (benign prostatic hyperplasia)    Cancer (HCC)    Chronic kidney disease  STAGE 3   Diabetes mellitus without complication (HCC)    HOH (hard of hearing)    AIDS   Hypertension    Inguinal hernia    Pain    CHRONIC LBP   Prostate cancer [facility])     SURGICAL HISTORY: Past Surgical History:  Procedure Laterality Date   BACK SURGERY     Lumbar   CATARACT EXTRACTION W/PHACO Right 07/10/2017   Procedure: CATARACT EXTRACTION PHACO AND INTRAOCULAR LENS PLACEMENT (IOC);  Surgeon: Galen Manila, MD;  Location: [facility] ORS;  Service: Ophthalmology;  Laterality: Right;  [place] 00:29.2 AP% 16.5 CDE 4.83 Fluid Pack Lot # [id number]   COLONOSCOPY     EYE SURGERY Bilateral    cataract extraction   HERNIA REPAIR     INGUINAL   PROSTATE BIOPSY N/A 04/08/2020   Procedure: PROSTATE BIOPSY Addison Bailey;  Surgeon: Orson Ape, MD;  Location: [facility] ORS;  Service: Urology;  Laterality: N/A;   TOTAL KNEE ARTHROPLASTY  Left 03/19/2018   Procedure: TOTAL KNEE ARTHROPLASTY;  Surgeon: Juanell Fairly, MD;  Location: [facility] ORS;  Service: Orthopedics;  Laterality: Left;    SOCIAL HISTORY: Social History   Socioeconomic History   Marital status: Married    Spouse name: Not on file   Number of children: Not on file   Years of education: Not on file   Highest education level: Some college, no degree  Occupational History   Not on file  Tobacco Use   Smoking status: Never   Smokeless tobacco: Never  Vaping Use   Vaping Use: Never used  Substance and Sexual Activity   Alcohol use: No   Drug use: Never   Sexual activity: Not Currently  Other Topics Concern   Not on file  Social History Narrative   Independent at baseline.  Lives at home with his wife   Social Determinants of Health   Financial Resource Strain: Low Risk  (05/12/2021)   Overall Financial Resource Strain (CARDIA)    Difficulty of Paying Living Expenses: Not hard at all  Food Insecurity: No Food Insecurity (09/25/2022)   Hunger Vital Sign    Worried About Running Out of Food in the Last Year: Never true    Ran Out of Food in the Last Year: Never true  Transportation Needs: No Transportation Needs (09/25/2022)   PRAPARE - Administrator, Civil Service (Medical): No    Lack of Transportation (Non-Medical): No  Physical Activity: Insufficiently Active (05/12/2021)   Exercise Vital Sign    Days of Exercise per Week: 7 days    Minutes of Exercise per Session: 10 min  Stress: No Stress Concern Present (05/12/2021)   [employer] of Occupational Health - Occupational Stress Questionnaire    Feeling of Stress : Not at all  Social Connections: Socially Integrated (05/12/2021)   Social Connection and Isolation Panel [NHANES]    Frequency of Communication with Friends and Family: More than three times a week    Frequency of Social Gatherings with Friends and Family: Once a week    Attends Religious Services: More than 4 times per  year    Active Member of [employer] or Organizations: Yes    Attends Engineer, structural: More than 4 times per year    Marital Status: Married  Catering manager Violence: Not At Risk (09/25/2022)   Humiliation, Afraid, Rape, and Kick questionnaire    Fear of Current or Ex-Partner: No    Emotionally Abused: No    Physically Abused: No  Sexually Abused: No    FAMILY HISTORY: Family History  Problem Relation Age of Onset   Diabetes Father    Breast cancer Sister     ALLERGIES:  has No Known Allergies.  MEDICATIONS:  Current Outpatient Medications  Medication Sig Dispense Refill   amLODipine (NORVASC) 5 MG tablet Take 5 mg by mouth daily.     aspirin EC 81 MG tablet Take 81 mg by mouth daily.     cholecalciferol (VITAMIN D) 1000 units tablet Take 1,000 Units by mouth daily.     dapagliflozin propanediol (FARXIGA) 10 MG TABS tablet Take 10 mg by mouth daily.     Iron-Vitamin C 65-125 MG TABS Take 1 tablet by mouth daily. 30 tablet 3   loratadine (CLARITIN) 10 MG tablet Take 10 mg by mouth once daily before chemo and at least 2 days after injection.     losartan (COZAAR) 100 MG tablet Take 100 mg by mouth daily.     lovastatin (MEVACOR) 40 MG tablet TAKE 1 TABLET BY MOUTH EVERY DAY 90 tablet 3   metoprolol tartrate (LOPRESSOR) 50 MG tablet TAKE 1 TABLET BY MOUTH EVERY 12 HOURS (Patient taking differently: Take 50 mg by mouth 2 (two) times daily.) 180 tablet 2   Misc Natural Products (PROSTATE SUPPORT PO) Take 2 capsules by mouth daily.     NOVOLOG FLEXPEN 100 UNIT/ML FlexPen Inject into the skin. Per sliding scale  0   Omega-3 Fatty Acids (FISH OIL PO) Take 1 capsule by mouth daily.      TRESIBA FLEXTOUCH 200 UNIT/ML SOPN Inject 80 Units into the muscle daily before breakfast.   2   TRULICITY 1.5 MG/0.5ML SOPN Inject into the skin.     vitamin B-12 (CYANOCOBALAMIN) 500 MCG tablet Take 500 mcg by mouth daily.     No current facility-administered medications for this visit.      PHYSICAL EXAMINATION: ECOG PERFORMANCE STATUS: 1 - Symptomatic but completely ambulatory Vitals:   10/04/22 1424  BP: (!) 154/56  Pulse: (!) 106  Temp: 98.8 F (37.1 C)  SpO2: 100%   Filed Weights   10/04/22 1424  Weight: 218 lb 8 oz (99.1 kg)     Physical Exam Constitutional:      General: He is not in acute distress. HENT:     Head: Normocephalic and atraumatic.  Eyes:     General: No scleral icterus. Cardiovascular:     Rate and Rhythm: Normal rate and regular rhythm.  Pulmonary:     Effort: Pulmonary effort is normal. No respiratory distress.     Breath sounds: No wheezing.  Abdominal:     General: Bowel sounds are normal. There is no distension.     Palpations: Abdomen is soft.  Musculoskeletal:        General: Normal range of motion.     Cervical back: Normal range of motion and neck supple.  Skin:    General: Skin is warm.     Findings: No erythema or rash.  Neurological:     Mental Status: He is alert and oriented to person, place, and time. Mental status is at baseline.     Cranial Nerves: No cranial nerve deficit.     Coordination: Coordination normal.  Psychiatric:        Mood and Affect: Mood normal.     LABORATORY DATA:  I have reviewed the data as listed    Latest Ref Rng & Units 10/04/2022    1:31 PM 09/27/2022  1:26 PM 09/26/2022    3:20 AM  CBC  WBC 4.0 - 10.5 K/uL 7.1  9.6  7.7   Hemoglobin 13.0 - 17.0 g/dL 69.6  9.3  7.9   Hematocrit 39.0 - 52.0 % 31.8  28.9  24.4   Platelets 150 - 400 K/uL 252  148  111       Latest Ref Rng & Units 10/04/2022    1:31 PM 09/26/2022    3:20 AM 09/25/2022    5:08 AM  CMP  Glucose 70 - 99 mg/dL 295  284  132   BUN 8 - 23 mg/dL 23  24  28    Creatinine 0.61 - 1.24 mg/dL 4.40  1.02  7.25   Sodium 135 - 145 mmol/L 134  136  139   Potassium 3.5 - 5.1 mmol/L 4.1  3.6  3.3   Chloride 98 - 111 mmol/L 98  108  111   CO2 22 - 32 mmol/L 26  24  22    Calcium 8.9 - 10.3 mg/dL 8.9  7.7  7.7   Total  Protein 6.5 - 8.1 g/dL 6.7     Total Bilirubin 0.3 - 1.2 mg/dL 0.2     Alkaline Phos 38 - 126 U/L 64     AST 15 - 41 U/L 21     ALT 0 - 44 U/L 20       RADIOGRAPHIC STUDIES: I have personally reviewed the radiological images as listed and agreed with the findings in the report. CT HEAD WO CONTRAST ( )  Result Date: 09/21/2022 CLINICAL DATA:  Altered mental status EXAM: CT HEAD WITHOUT CONTRAST TECHNIQUE: Contiguous axial images were obtained from the base of the skull through the vertex without intravenous contrast. RADIATION DOSE REDUCTION: This exam was performed according to the departmental dose-optimization program which includes automated exposure control, adjustment of the mA and/or kV according to patient size and/or use of iterative reconstruction technique. COMPARISON:  CT head 07/06/15 FINDINGS: Brain: No hemorrhage. No hydrocephalus. No extra-axial fluid collection. No CT evidence of an acute cortical infarct. No mass effect. No mass lesion. There is mild overall chronic microvascular ischemic change. Vascular: No hyperdense vessel or unexpected calcification. Skull: Normal. Negative for fracture or focal lesion. Sinuses/Orbits: No middle ear or mastoid effusion. Paranasal sinuses are clear. Orbits are unremarkable. Other: None. IMPRESSION: No acute intracranial process. No specific etiology for altered mental status identified. Electronically Signed   By: Lorenza Cambridge M.D.   On: 09/21/2022 10:12   CT Abdomen Pelvis Wo Contrast  Result Date: 09/20/2022 CLINICAL DATA:  Symptomatic anemia. Clinical suspicion for retroperitoneal hemorrhage. EXAM: CT ABDOMEN AND PELVIS WITHOUT CONTRAST TECHNIQUE: Multidetector CT imaging of the abdomen and pelvis was performed following the standard protocol without IV contrast. RADIATION DOSE REDUCTION: This exam was performed according to the departmental dose-optimization program which includes automated exposure control, adjustment of the mA and/or kV  according to patient size and/or use of iterative reconstruction technique. COMPARISON:  05/13/2020 FINDINGS: Lower chest: No acute findings. Hepatobiliary: No mass visualized on this unenhanced exam. Gallbladder is unremarkable. No evidence of biliary ductal dilatation. Pancreas: No mass or inflammatory process visualized on this unenhanced exam. Spleen:  Within normal limits in size. Adrenals/Urinary tract: No evidence of urolithiasis. No suspicious renal masses identified. Moderate to severe bilateral hydroureteronephrosis is seen to the level of the bladder, which is new since previous study. No ureteral calculi or other obstructing etiology visualized. Distended urinary bladder noted with mild diffuse wall thickening. Stomach/Bowel:  No evidence of obstruction, inflammatory process, or abnormal fluid collections. Normal appendix visualized. Moderate colonic stool burden noted. Vascular/Lymphatic: No pathologically enlarged lymph nodes identified. No evidence of abdominal aortic aneurysm. Aortic atherosclerotic calcification incidentally noted. Reproductive: Moderately enlarged prostate gland, without significant change. Other:  None.  No evidence of retroperitoneal hemorrhage. Musculoskeletal:  No suspicious bone lesions identified. IMPRESSION: No evidence of retroperitoneal hemorrhage. New moderate to severe bilateral hydroureteronephrosis to the level of the bladder. No ureteral calculi or other obstructing etiology visualized. This is likely due to vesicoureteral reflux given distended urinary bladder. Distended urinary bladder with mild diffuse wall thickening, presumably due to chronic bladder outlet obstruction. Moderately enlarged prostate gland, without significant change. Moderate colonic stool burden; recommend correlation for symptoms or signs of constipation. Electronically Signed   By: Danae Orleans M.D.   On: 09/20/2022 12:49

## 2022-10-04 NOTE — Assessment & Plan Note (Signed)
Grade 1 Observation.  

## 2022-10-04 NOTE — Assessment & Plan Note (Signed)
Anemia is likely secondary to bone marrow suppression as well as anemia due to CKD. Iron panel showed ferritin less than 200. Hemoglobin has improved above 10, hold off erythropoietin replacement therapy. Recommend IV Venofer today.

## 2022-10-04 NOTE — Telephone Encounter (Signed)
Physical Therapist called asking for order approval and clarification of medicine orders. She is asking for PT 2 wk 4, 1 wk 5. Also asking if he is to hold his metoprolol and Lasartan for SBP <110 and if his Amlodipine and Dexamethasone has been stopped. Please advise

## 2022-10-05 ENCOUNTER — Inpatient Hospital Stay: Payer: Medicare HMO

## 2022-10-05 ENCOUNTER — Encounter: Payer: Self-pay | Admitting: Oncology

## 2022-10-05 ENCOUNTER — Ambulatory Visit: Payer: Medicare HMO

## 2022-10-05 NOTE — Telephone Encounter (Signed)
Call returned to Keith Howe and read response to her from physician team

## 2022-10-06 ENCOUNTER — Encounter: Payer: Self-pay | Admitting: Oncology

## 2022-10-06 ENCOUNTER — Ambulatory Visit: Payer: Medicare HMO

## 2022-10-09 ENCOUNTER — Encounter
Admission: RE | Admit: 2022-10-09 | Discharge: 2022-10-09 | Disposition: A | Discharge status: Home / Self Care | Payer: Medicare HMO | Source: Ambulatory Visit | Attending: Oncology | Admitting: Oncology

## 2022-10-09 ENCOUNTER — Ambulatory Visit: Payer: Medicare HMO

## 2022-10-09 DIAGNOSIS — C61 Malignant neoplasm of prostate: Secondary | ICD-10-CM | POA: Diagnosis present

## 2022-10-09 MED ORDER — PIFLIFOLASTAT F 18 (PYLARIFY) INJECTION
9.0000 | Freq: Once | INTRAVENOUS | Status: AC
  Administered 2022-10-09: 9.71 via INTRAVENOUS

## 2022-10-11 ENCOUNTER — Ambulatory Visit: Payer: Medicare HMO | Admitting: Oncology

## 2022-10-11 ENCOUNTER — Ambulatory Visit: Payer: Medicare HMO

## 2022-10-11 ENCOUNTER — Other Ambulatory Visit: Payer: Medicare HMO

## 2022-10-13 ENCOUNTER — Other Ambulatory Visit: Payer: Self-pay

## 2022-10-13 ENCOUNTER — Ambulatory Visit: Payer: Medicare HMO

## 2022-10-13 ENCOUNTER — Emergency Department
Admission: EM | Admit: 2022-10-13 | Discharge: 2022-10-13 | Disposition: A | Discharge status: Home / Self Care | Payer: Medicare HMO | Attending: Emergency Medicine | Admitting: Emergency Medicine

## 2022-10-13 ENCOUNTER — Emergency Department: Payer: Medicare HMO

## 2022-10-13 DIAGNOSIS — N390 Urinary tract infection, site not specified: Secondary | ICD-10-CM | POA: Insufficient documentation

## 2022-10-13 DIAGNOSIS — R319 Hematuria, unspecified: Secondary | ICD-10-CM | POA: Diagnosis present

## 2022-10-13 LAB — URINALYSIS, COMPLETE (UACMP) WITH MICROSCOPIC
Bacteria, UA: NONE SEEN
Bilirubin Urine: NEGATIVE
Glucose, UA: >=500 mg/dL — AB
Ketones, ur: NEGATIVE mg/dL
Nitrite: NEGATIVE
Protein, ur: 100 mg/dL — AB
RBC / HPF: >50 RBC/hpf (ref 0–5)
Specific Gravity, Urine: 1.014 (ref 1.005–1.030)
WBC, UA: >50 WBC/hpf (ref 0–5)
pH: 7 (ref 5.0–8.0)

## 2022-10-13 LAB — CBC
HCT: 30.2 % — ABNORMAL LOW (ref 39.0–52.0)
Hemoglobin: 9.4 g/dL — ABNORMAL LOW (ref 13.0–17.0)
MCH: 28.1 pg (ref 26.0–34.0)
MCHC: 31.1 g/dL (ref 30.0–36.0)
MCV: 90.4 fL (ref 80.0–100.0)
Platelets: 214 10*3/uL (ref 150–400)
RBC: 3.34 MIL/uL — ABNORMAL LOW (ref 4.22–5.81)
RDW: 15.7 % — ABNORMAL HIGH (ref 11.5–15.5)
WBC: 6.8 10*3/uL (ref 4.0–10.5)
nRBC: 0 % (ref 0.0–0.2)

## 2022-10-13 LAB — COMPREHENSIVE METABOLIC PANEL
ALT: 17 U/L (ref 0–44)
AST: 27 U/L (ref 15–41)
Albumin: 2.7 g/dL — ABNORMAL LOW (ref 3.5–5.0)
Alkaline Phosphatase: 69 U/L (ref 38–126)
Anion gap: 11 (ref 5–15)
BUN: 24 mg/dL — ABNORMAL HIGH (ref 8–23)
CO2: 24 mmol/L (ref 22–32)
Calcium: 8.9 mg/dL (ref 8.9–10.3)
Chloride: 102 mmol/L (ref 98–111)
Creatinine, Ser: 1.37 mg/dL — ABNORMAL HIGH (ref 0.61–1.24)
GFR, Estimated: 52 mL/min — ABNORMAL LOW (ref >60)
Glucose, Bld: 274 mg/dL — ABNORMAL HIGH (ref 70–99)
Potassium: 4.2 mmol/L (ref 3.5–5.1)
Sodium: 137 mmol/L (ref 135–145)
Total Bilirubin: 0.3 mg/dL (ref 0.3–1.2)
Total Protein: 6.3 g/dL — ABNORMAL LOW (ref 6.5–8.1)

## 2022-10-13 MED ORDER — CEPHALEXIN 500 MG PO CAPS: 500.0000 mg | ORAL_CAPSULE | Freq: Four times a day (QID) | ORAL | 0 refills | Status: AC

## 2022-10-13 MED ORDER — CEPHALEXIN 500 MG PO CAPS
500.0000 mg | ORAL_CAPSULE | Freq: Once | ORAL | Status: AC
  Administered 2022-10-13: 500 mg via ORAL
  Filled 2022-10-13: qty 1

## 2022-10-13 NOTE — ED Triage Notes (Signed)
Pt to ed from home for blood in his foley bag. Pt states it has been like this since he was admitted. Pt is caox4, in no acute distress. Pt has no complaints at this time.

## 2022-10-13 NOTE — ED Provider Triage Note (Signed)
Emergency Medicine Provider Triage Evaluation Note  Keith Howe , a 82 y.o. male  was evaluated in triage.  Pt complains of hematuria in foley bag x 2 days. Patient was hospitalized for hypotension, dehydration, anemia following chemotherapy discontinuation per patient provider wanted to keep foley in place for 2-3weeks.  Appt on 19th to have catheter removed. Progressive bleeding over week. Denies pain, fever, flank pain, chest pain, and SOB.    Physical Exam  BP (!) 153/73   Pulse 100   Temp 98.1 F (36.7 C) (Oral)   Resp 18   Ht 6\' 1"  (1.854 m)   Wt 99 kg   SpO2 98%   BMI 28.80 kg/m  Gen:   Awake, no distress   Resp:  Normal effort  MSK:   Moves extremities without difficulty  Other:  Foley bag has 300 mL of dark red blood.   Medical Decision Making  Medically screening exam initiated at 6:17 PM.  Appropriate orders placed.  [name] was informed that the remainder of the evaluation will be completed by another provider, this initial triage assessment does not replace that evaluation, and the importance of remaining in the ED until their evaluation is complete.     Romeo Apple, Vonette Grosso A, PA-C 10/13/22 1829

## 2022-10-13 NOTE — ED Notes (Signed)
Gave warm blankets and boxed lunch per provider order.

## 2022-10-13 NOTE — Discharge Instructions (Signed)
As we discussed please return if the output stops, or if you have any shortness of breath, weakness or any other new or concerning symptoms.

## 2022-10-13 NOTE — ED Provider Notes (Signed)
[facility] Provider Note    Event Date/Time   First MD Initiated Contact with Patient 10/13/22 1953     (approximate)   History   Indwelling Catheter Complications   HPI  ROSHON Howe is a 82 y.o. male  who presents to the emergency department today because of concern for bleeding in his urinary catheter. Catheter was placed during recent hospitalization, however patient is not sure why. Two days ago the patient started noticing blood. Has not had any abdominal discomfort. Denies any decrease in drainage.        Physical Exam   Triage Vital Signs: ED Triage Vitals  Enc Vitals Group     BP 10/13/22 1628 (!) 153/73     Pulse Rate 10/13/22 1628 100     Resp 10/13/22 1628 18     Temp 10/13/22 1628 98.1 F (36.7 C)     Temp Source 10/13/22 1628 Oral     SpO2 10/13/22 1628 98 %     Weight 10/13/22 1629 218 lb 4.1 oz (99 kg)     Height 10/13/22 1629 6\' 1"  (1.854 m)     Head Circumference --      Peak Flow --      Pain Score 10/13/22 1628 0     Pain Loc --      Pain Edu? --      Excl. in GC? --     Most recent vital signs: Vitals:   10/13/22 1628  BP: (!) 153/73  Pulse: 100  Resp: 18  Temp: 98.1 F (36.7 C)  SpO2: 98%   General: Awake, alert, oriented. CV:  Good peripheral perfusion.  Resp:  Normal effort.  Abd:  No distention. Tender to palpation in the lower abdomen.   ED Results / Procedures / Treatments   Labs (all labs ordered are listed, but only abnormal results are displayed) Labs Reviewed  CBC - Abnormal; Notable for the following components:      Result Value   RBC 3.34 (*)    Hemoglobin 9.4 (*)    HCT 30.2 (*)    RDW 15.7 (*)    All other components within normal limits  COMPREHENSIVE METABOLIC PANEL - Abnormal; Notable for the following components:   Glucose, Bld 274 (*)    BUN 24 (*)    Creatinine, Ser 1.37 (*)    Total Protein 6.3 (*)    Albumin 2.7 (*)    GFR, Estimated 52 (*)    All other components  within normal limits     EKG  None   RADIOLOGY I independently interpreted and visualized the CT renal stone. My interpretation: No kidney stone Radiology interpretation:  IMPRESSION:  1. Resolution of bilateral hydronephrosis after decompression of the  bladder. The bladder contains a Foley catheter and demonstrates  diffuse circumferential wall thickening as well as some internal  higher density material consistent with clot.  2. Stable prostate enlargement.  3. Stable right inguinal hernia containing fat.  4. Bone lesions seen by PET scan are difficult to see by CT. There  is suggestion of multiple very subtle lytic lesions in several  lumbar vertebral bodies, the most conspicuous in L4. No pathologic  fracture identified.     PROCEDURES:  Critical Care performed: No  MEDICATIONS ORDERED IN ED: Medications - No data to display   IMPRESSION / MDM / ASSESSMENT AND PLAN / ED COURSE  I reviewed the triage vital signs and the nursing notes.  Differential diagnosis includes, but is not limited to, infection, neoplasm, trauma  Patient's presentation is most consistent with acute presentation with potential threat to life or bodily function.  Patient presented to the emergency department today with concerns for hematuria.  Patient has Foley catheter in place.  Blood work shows anemia however this is not significantly different from recent blood test.  Urine does show some signs concerning for infection.  Will start on antibiotics.  Did discuss return precautions for occlusion, blood loss.      FINAL CLINICAL IMPRESSION(S) / ED DIAGNOSES   Final diagnoses:  Hematuria, unspecified type  Lower urinary tract infection     Note:  This document was prepared using Dragon voice recognition software and may include unintentional dictation errors.    Keith Semen, MD 10/13/22 [phone]

## 2022-10-14 LAB — URINE CULTURE

## 2022-10-15 LAB — URINE CULTURE

## 2022-10-16 ENCOUNTER — Encounter: Payer: Self-pay | Admitting: Oncology

## 2022-10-17 LAB — URINE CULTURE

## 2022-10-18 LAB — URINE CULTURE: Culture: 40000 — AB

## 2022-10-19 NOTE — Progress Notes (Signed)
ED Antimicrobial Stewardship Positive Culture Follow Up   Keith Howe is an 82 y.o. male who presented to [facility] on 10/13/2022 with a chief complaint of  Chief Complaint  Patient presents with   Indwelling Catheter Complications    Recent Results (from the past 720 hour(s))  MRSA Next Gen by PCR, Nasal     Status: None   Collection Time: 09/21/22 12:50 PM   Specimen: Nasal Mucosa; Nasal Swab  Result Value Ref Range Status   MRSA by PCR Next Gen NOT DETECTED NOT DETECTED Final    Comment: (NOTE) The GeneXpert MRSA Assay (FDA approved for NASAL specimens only), is one component of a comprehensive MRSA colonization surveillance program. It is not intended to diagnose MRSA infection nor to guide or monitor treatment for MRSA infections. Test performance is not FDA approved in patients less than 57 years old. Performed at [facility], [address] [place], [place] [address]   Urine Culture     Status: Abnormal   Collection Time: 10/13/22  9:38 PM   Specimen: Urine, Catheterized  Result Value Ref Range Status   Specimen Description   Final    URINE, CATHETERIZED Performed at [facility], [address] [place], [place] [address]    Special Requests   Final    NONE Performed at [facility], [address] [place], [place] [address]    Culture (A)  Final    40,000 COLONIES/mL ENTEROBACTER CLOACAE 10,000 COLONIES/mL SERRATIA MARCESCENS    Report Status 10/18/2022 FINAL  Final   Organism ID, Bacteria ENTEROBACTER CLOACAE (A)  Final   Organism ID, Bacteria SERRATIA MARCESCENS (A)  Final      Susceptibility   Enterobacter cloacae - MIC*    CEFEPIME <=0.12 SENSITIVE Sensitive     CIPROFLOXACIN <=0.25 SENSITIVE Sensitive     GENTAMICIN <=1 SENSITIVE Sensitive     IMIPENEM 1 SENSITIVE Sensitive     NITROFURANTOIN 64 INTERMEDIATE Intermediate     TRIMETH/SULFA <=20 SENSITIVE Sensitive     PIP/TAZO <=4 SENSITIVE Sensitive     * 40,000  COLONIES/mL ENTEROBACTER CLOACAE   Serratia marcescens - MIC*    CEFEPIME <=0.12 SENSITIVE Sensitive     CEFTRIAXONE <=0.25 SENSITIVE Sensitive     CIPROFLOXACIN <=0.25 SENSITIVE Sensitive     GENTAMICIN <=1 SENSITIVE Sensitive     NITROFURANTOIN 128 RESISTANT Resistant     TRIMETH/SULFA <=20 SENSITIVE Sensitive     * 10,000 COLONIES/mL SERRATIA MARCESCENS    [x]  Treated with keflex, organism resistant to prescribed antimicrobial []  Patient discharged originally without antimicrobial agent and treatment is now indicated  New antibiotic prescription: none, After discussion with ED provider, patient asymptomatic. Will recommend stopping treating or if symptoms will call in Cipro 500 mg BID x 5 days  ED Provider: Dr. Marquis Lunch, PharmD, BCPS Clinical Pharmacist   10/19/2022, 3:10 PM Clinical Pharmacist Monday - Friday phone -  [phone] Saturday - Sunday phone - [phone]

## 2022-10-25 ENCOUNTER — Ambulatory Visit: Payer: Medicare HMO | Admitting: Urology

## 2022-10-25 ENCOUNTER — Ambulatory Visit: Payer: Medicare HMO

## 2022-10-25 ENCOUNTER — Encounter: Payer: Self-pay | Admitting: Urology

## 2022-10-25 ENCOUNTER — Other Ambulatory Visit: Payer: Self-pay | Admitting: Urology

## 2022-10-25 ENCOUNTER — Other Ambulatory Visit: Payer: Medicare HMO

## 2022-10-25 ENCOUNTER — Ambulatory Visit: Payer: Medicare HMO | Admitting: Oncology

## 2022-10-25 VITALS — BP 150/75 | HR 116 | Ht 73.0 in | Wt 227.0 lb

## 2022-10-25 DIAGNOSIS — C61 Malignant neoplasm of prostate: Secondary | ICD-10-CM | POA: Diagnosis not present

## 2022-10-25 DIAGNOSIS — R339 Retention of urine, unspecified: Secondary | ICD-10-CM | POA: Diagnosis not present

## 2022-10-25 NOTE — Progress Notes (Unsigned)
Surgical Physician Order Form [facility] [place]  * Scheduling expectation :  4 to 8 weeks  (needs cystoscopy in clinic prior)  *Length of Case: 2 hours  *Clearance needed: no  *Anticoagulation Instructions: Hold all anticoagulants  *Aspirin Instructions: Hold Aspirin  *Post-op visit Date/Instructions:  1-3 day cath removal  *Diagnosis: Prostate Cancer, urinary retention  *Procedure:  HOLEP ([address])   Additional orders: N/A  -Admit type: OUTpatient  -Anesthesia: General  -VTE Prophylaxis Standing Order SCD's       Other:   -Standing Lab Orders Per Anesthesia    Lab other: UA&Urine Culture  -Standing Test orders EKG/Chest x-ray per Anesthesia       Test other:   - Medications:  Cipro 400mg  IV  -Other orders:  N/A

## 2022-10-25 NOTE — Patient Instructions (Addendum)
Holmium Laser Enucleation of the Prostate (HoLEP) ° °HoLEP is a treatment for men with benign prostatic hyperplasia (BPH). The laser surgery removed blockages of urine flow, and is done without any incisions on the body. ° ° °  °What is HoLEP?  °HoLEP is a type of laser surgery used to treat obstruction (blockage) of urine flow as a result of benign prostatic hyperplasia (BPH). In men with BPH, the prostate gland is not cancerous, but has become enlarged. An enlarged prostate can result in a number of urinary tract symptoms such as weak urinary stream, difficulty in starting urination, inability to urinate, frequent urination, or getting up at night to urinate. ° °HoLEP was developed in the 1990's as a more effective and less expensive surgical option for BPH, compared to other surgical options such as laser vaporization(PVP/greenlight laser), transurethral resection of the prostate(TURP), and open simple prostatectomy.  ° °What happens during a HoLEP? °· HoLEP requires general anesthesia (“asleep” throughout the procedure).  °· An antibiotic is given to reduce the risk of infection °· A surgical instrument called a resectoscope is inserted through the urethra (the tube that carries urine from the bladder). The resectoscope has a camera that allows the surgeon to view the internal structure of the prostate gland, and to see where the incisions are being made during surgery. °· The laser is inserted into the resectoscope and is used to enucleate (free up) the enlarged prostate tissue from the capsule (outer shell) and then to seal up any blood vessels. The tissue that has been removed is pushed back into the bladder. °· A morcellator is placed through the resectoscope, and is used to suction out the prostate tissue that has been pushed into the bladder. °· When the prostate tissue has been removed, the resectoscope is removed, and a foley catheter is placed to allow healing and drain the urine from the  bladder. °  ° ° °What happens after a HoLEP? °· More than 90% of patients go home the same day a few hours after surgery. Less than 10% will be admitted to the hospital overnight for observation to monitor the urine, or if they have other medical problems. °· Fluid is flushed through the catheter for about 1 hour after surgery to clear any blood from the urine. It is normal to have some blood in the urine after surgery. The need for blood transfusion is extremely rare. °· Eating and drinking are permitted after the procedure once the patient has fully awakened from anesthesia. °· The catheter is usually removed 2-3 days after surgery- the patient will come to clinic to have the catheter removed and make sure they can urinate on their own. °· It is very important to drink lots of fluids after surgery for one week to keep the bladder flushed. °· At first, there may be some burning with urination, but this typically improved within a few hours to days. Most patients do not have a significant amount of pain, and narcotic pain medications are rarely needed. °· Symptoms of urinary frequency, urgency, and even leakage are NORMAL for the first few weeks after surgery as the bladder adjusts after having to work hard against blockage from the prostate for many years. This will improve, but can sometimes take several months. °· The use of pelvic floor exercises (Kegel exercises) can help improve problems with urinary incontinence.  °· After catheter removal, patients will be seen at 6 weeks and 6 months for symptom check °· No heavy lifting for   at least 2-3 weeks after surgery, however patients can walk and do light activities the first day after surgery. Return to work time depends on occupation. ° ° ° °What are the advantages of HoLEP? °· HoLEP has been studied in many different parts of the world and has been shown to be a safe and effective procedure. Although there are many types of BPH surgeries available, HoLEP offers a  unique advantage in being able to remove a large amount of tissue without any incisions on the body, even in very large prostates, while decreasing the risk of bleeding and providing tissue for pathology (to look for cancer). This decreases the need for blood transfusions during surgery, minimizes hospital stay, and reduces the risk of needing repeat treatment. ° °What are the side effects of HoLEP? °· Temporary burning and bleeding during urination. Some blood may be seen in the urine for weeks after surgery and is part of the healing process. °· Urinary incontinence (inability to control urine flow) is expected in all patients immediately after surgery and they should wear pads for the first few days/weeks. This typically improves over the course of several weeks. Performing Kegel exercises can help decrease leakage from stress maneuvers such as coughing, sneezing, or lifting. The rate of long term leakage is very low. Patients may also have leakage with urgency and this may be treated with medication. The risk of urge incontinence can be dependent on several factors including age, prostate size, symptoms, and other medical problems. °· Retrograde ejaculation or “backwards ejaculation.” In 75% of cases, the patient will not see any fluid during ejaculation after surgery. °· Erectile function is generally not significantly affected.  ° °What are the risks of HoLEP? °· Injury to the urethra or development of scar tissue at a later date °· Injury to the capsule of the prostate (typically treated with longer catheterization). °· Injury to the bladder or ureteral orifices (where the urine from the kidney drains out) °· Infection of the bladder, testes, or kidneys °· Return of urinary obstruction at a later date requiring another operation (<2%) °· Need for blood transfusion or re-operation due to bleeding °· Failure to relieve all symptoms and/or need for prolonged catheterization after surgery °· 5-15% of patients are  found to have previously undiagnosed prostate cancer in their specimen. Prostate cancer can be treated after HoLEP. °· Standard risks of anesthesia including blood clots, heart attacks, etc ° °When should I call my doctor? °· Fever over 101.3 degrees °· Inability to urinate, or large blood clots in the urine  ° °Cystoscopy °Cystoscopy is a procedure that is used to help diagnose and sometimes treat conditions that affect the lower urinary tract. The lower urinary tract includes the bladder and the urethra. The urethra is the tube that drains urine from the bladder. Cystoscopy is done using a thin, tube-shaped instrument with a light and camera at the end (cystoscope). The cystoscope may be hard or flexible, depending on the goal of the procedure. The cystoscope is inserted through the urethra, into the bladder. °Cystoscopy may be recommended if you have: °Urinary tract infections that keep coming back. °Blood in the urine (hematuria). °An inability to control when you urinate (urinary incontinence) or an overactive bladder. °Unusual cells found in a urine sample. °A blockage in the urethra, such as a urinary stone. °Painful urination. °An abnormality in the bladder found during an intravenous pyelogram (IVP) or CT scan. °What are the risks? °Generally, this is a safe procedure. However, problems   may occur, including: °Infection. °Bleeding. ° °What happens during the procedure? ° °You will be given one or more of the following: °A medicine to numb the area (local anesthetic). °The area around the opening of your urethra will be cleaned. °The cystoscope will be passed through your urethra into your bladder. °Germ-free (sterile) fluid will flow through the cystoscope to fill your bladder. The fluid will stretch your bladder so that your health care provider can clearly examine your bladder walls. °Your doctor will look at the urethra and bladder. °The cystoscope will be removed °The procedure may vary among health care  providers  °What can I expect after the procedure? °After the procedure, it is common to have: °Some soreness or pain in your urethra. °Urinary symptoms. These include: °Mild pain or burning when you urinate. Pain should stop within a few minutes after you urinate. This may last for up to a few days after the procedure. °A small amount of blood in your urine for several days. °Feeling like you need to urinate but producing only a small amount of urine. °Follow these instructions at home: °General instructions °Return to your normal activities as told by your health care provider.  °Drink plenty of fluids after the procedure. °Keep all follow-up visits as told by your health care provider. This is important. °Contact a health care provider if you: °Have pain that gets worse or does not get better with medicine, especially pain when you urinate lasting longer than 72 hours after the procedure. °Have trouble urinating. °Get help right away if you: °Have blood clots in your urine. °Have a fever or chills. °Are unable to urinate. °Summary °Cystoscopy is a procedure that is used to help diagnose and sometimes treat conditions that affect the lower urinary tract. °Cystoscopy is done using a thin, tube-shaped instrument with a light and camera at the end. °After the procedure, it is common to have some soreness or pain in your urethra. °It is normal to have blood in your urine after the procedure.  °If you were prescribed an antibiotic medicine, take it as told by your health care provider.  °This information is not intended to replace advice given to you by your health care provider. Make sure you discuss any questions you have with your health care provider. °Document Revised: 04/16/2018 Document Reviewed: 04/16/2018 °Elsevier Patient Education © 2020 [employer] ° ° °

## 2022-10-25 NOTE — Progress Notes (Signed)
   10/25/2022 1:19 PM   Keith Howe 1941/02/20 [id number]  Reason for visit: Urinary retention, metastatic prostate cancer  HPI: 82 year old man I am meeting for the first time today.  He was previously followed by Dr. Sheppard Penton.  He was seen by our PA Carman Ching in consult for gross hematuria after Foley catheter was placed in May 2024 for distended bladder with bilateral hydroureteronephrosis.  He has widely metastatic prostate cancer to bone that is managed by oncology, and is castrate resistant with significantly rising PSA despite ADT, recent PSA 10/04/2018 248 which had increased significantly over the last 6 weeks.  I reviewed the oncology notes, as well as the imaging including the recent PSMA PET scan on 10/09/2022 showing extensive skeletal metastasis with no evidence of visceral or nodal metastasis.  Bladder decompressed with Foley, no hydronephrosis.  He was initially treated with external beam radiation and ADT with Dr. Aggie Cosier and Dr. Sheppard Penton.  Urine in the Foley is draining dark pink, CT stone protocol from 10/13/2022 with no significant bladder clot, no hydronephrosis, decompressed bladder.  He is very bothered by the Foley catheter and is interested in any options to resume spontaneous voiding.  I think we need to have realistic expectations with his frailty and widely metastatic prostate cancer.  Unfortunately he would also be at very high risk for incontinence with any surgical intervention with his history of radiation.  We discussed options including a chronic Foley with monthly changes, suprapubic tube, or even consideration of an outlet procedure with HOLEP to try to resume spontaneous voiding.  He is very motivated to get rid of the catheter. We discussed the risks and benefits of HoLEP at length.  The procedure requires general anesthesia and takes 1 to 2 hours, and a holmium laser is used to enucleate the prostate and push this tissue into the bladder.  A morcellator is  then used to remove this tissue, which is sent for pathology.  The vast majority(>95%) of patients are able to discharge the same day with a catheter in place for 2 to 3 days, and will follow-up in clinic for a voiding trial.  We specifically discussed the risks of bleeding, infection, retrograde ejaculation, temporary urgency and urge incontinence, very low risk of long-term incontinence, urethral stricture/bladder neck contracture, pathologic evaluation of prostate tissue and possible detection of prostate cancer or other malignancy, and possible need for additional procedures.  With his history of radiation as well as gross hematuria, I would recommend a cystoscopy prior to definitively considering HOLEP.  His PSA has rapidly increased over the last 6 weeks and PSMA scan shows widely metastatic bony disease.  Defer further treatments and ongoing ADT to oncology.  He also would likely benefit from palliative care involvement regarding his chronic back pain from metastatic prostate cancer.  RTC 1 to 2 weeks for cystoscopy, Foley replacement afterwards Tentatively consider HOLEP in 4 to 5 weeks if clinically stable and able to tolerate surgery I messaged his oncologist Dr. Cathie Hoops with my recommendations regarding his pain medications and a palliative care evaluation  I spent 50 total minutes on the day of the encounter including pre-visit review of the medical record, face-to-face time with the patient, and post visit ordering of labs/imaging/tests.   Sondra Come, MD  [facility] [address] [place], [place] [address] [phone]

## 2022-10-27 ENCOUNTER — Ambulatory Visit: Payer: Medicare HMO

## 2022-10-31 ENCOUNTER — Encounter: Payer: Self-pay | Admitting: Emergency Medicine

## 2022-10-31 ENCOUNTER — Other Ambulatory Visit: Payer: Self-pay

## 2022-10-31 ENCOUNTER — Emergency Department
Admission: EM | Admit: 2022-10-31 | Discharge: 2022-10-31 | Discharge status: Against Medical Advice | Payer: Medicare HMO | Attending: Emergency Medicine | Admitting: Emergency Medicine

## 2022-10-31 ENCOUNTER — Telehealth: Payer: Self-pay | Admitting: Urology

## 2022-10-31 DIAGNOSIS — Z5321 Procedure and treatment not carried out due to patient leaving prior to being seen by health care provider: Secondary | ICD-10-CM | POA: Diagnosis not present

## 2022-10-31 DIAGNOSIS — Y828 Other medical devices associated with adverse incidents: Secondary | ICD-10-CM | POA: Insufficient documentation

## 2022-10-31 DIAGNOSIS — T83031A Leakage of indwelling urethral catheter, initial encounter: Secondary | ICD-10-CM | POA: Insufficient documentation

## 2022-10-31 NOTE — Telephone Encounter (Signed)
Pt's wife called to let you know he was in the ER last night and they put in a new catheter.

## 2022-10-31 NOTE — ED Triage Notes (Signed)
Pt presents to triage via [facility] for leaking around his foley catheter. Pt is followed with urology for catheter placement. Denies pain or discomfort.  A&Ox4 at this time. Denies CP or SOB.   Pt with visible discomfort when foley is touched. Not actively draining at this time.

## 2022-10-31 NOTE — ED Triage Notes (Signed)
EMS brings pt in from home to check foley insertion; st urinating around catheter

## 2022-11-01 ENCOUNTER — Ambulatory Visit: Payer: Medicare HMO

## 2022-11-01 ENCOUNTER — Ambulatory Visit: Payer: Medicare HMO | Admitting: Internal Medicine

## 2022-11-01 ENCOUNTER — Inpatient Hospital Stay: Payer: Medicare HMO | Attending: Oncology

## 2022-11-01 ENCOUNTER — Ambulatory Visit: Payer: Medicare HMO | Admitting: Oncology

## 2022-11-01 DIAGNOSIS — D631 Anemia in chronic kidney disease: Secondary | ICD-10-CM | POA: Diagnosis not present

## 2022-11-01 DIAGNOSIS — C7951 Secondary malignant neoplasm of bone: Secondary | ICD-10-CM | POA: Diagnosis not present

## 2022-11-01 DIAGNOSIS — N183 Chronic kidney disease, stage 3 unspecified: Secondary | ICD-10-CM | POA: Diagnosis not present

## 2022-11-01 DIAGNOSIS — C61 Malignant neoplasm of prostate: Secondary | ICD-10-CM | POA: Diagnosis not present

## 2022-11-01 DIAGNOSIS — G620 Drug-induced polyneuropathy: Secondary | ICD-10-CM | POA: Diagnosis not present

## 2022-11-01 DIAGNOSIS — D649 Anemia, unspecified: Secondary | ICD-10-CM

## 2022-11-01 LAB — CBC WITH DIFFERENTIAL (CANCER CENTER ONLY)
Abs Immature Granulocytes: 0.22 10*3/uL — ABNORMAL HIGH (ref 0.00–0.07)
Basophils Absolute: 0 10*3/uL (ref 0.0–0.1)
Basophils Relative: 0 %
Eosinophils Absolute: 0.1 10*3/uL (ref 0.0–0.5)
Eosinophils Relative: 1 %
HCT: 22.6 % — ABNORMAL LOW (ref 39.0–52.0)
Hemoglobin: 7.2 g/dL — ABNORMAL LOW (ref 13.0–17.0)
Immature Granulocytes: 2 %
Lymphocytes Relative: 3 %
Lymphs Abs: 0.3 10*3/uL — ABNORMAL LOW (ref 0.7–4.0)
MCH: 26.9 pg (ref 26.0–34.0)
MCHC: 31.9 g/dL (ref 30.0–36.0)
MCV: 84.3 fL (ref 80.0–100.0)
Monocytes Absolute: 0.6 10*3/uL (ref 0.1–1.0)
Monocytes Relative: 6 %
Neutro Abs: 7.9 10*3/uL — ABNORMAL HIGH (ref 1.7–7.7)
Neutrophils Relative %: 88 %
Platelet Count: 202 10*3/uL (ref 150–400)
RBC: 2.68 MIL/uL — ABNORMAL LOW (ref 4.22–5.81)
RDW: 17.7 % — ABNORMAL HIGH (ref 11.5–15.5)
WBC Count: 9 10*3/uL (ref 4.0–10.5)
nRBC: 0.3 % — ABNORMAL HIGH (ref 0.0–0.2)

## 2022-11-01 LAB — PSA: Prostatic Specific Antigen: 1215.52 ng/mL — ABNORMAL HIGH (ref 0.00–4.00)

## 2022-11-01 LAB — FERRITIN: Ferritin: 2399 ng/mL — ABNORMAL HIGH (ref 24–336)

## 2022-11-01 LAB — CMP (CANCER CENTER ONLY)
ALT: 23 U/L (ref 0–44)
AST: 60 U/L — ABNORMAL HIGH (ref 15–41)
Albumin: 2.9 g/dL — ABNORMAL LOW (ref 3.5–5.0)
Alkaline Phosphatase: 67 U/L (ref 38–126)
Anion gap: 13 (ref 5–15)
BUN: 35 mg/dL — ABNORMAL HIGH (ref 8–23)
CO2: 23 mmol/L (ref 22–32)
Calcium: 9.4 mg/dL (ref 8.9–10.3)
Chloride: 93 mmol/L — ABNORMAL LOW (ref 98–111)
Creatinine: 1.76 mg/dL — ABNORMAL HIGH (ref 0.61–1.24)
GFR, Estimated: 38 mL/min — ABNORMAL LOW (ref >60)
Glucose, Bld: 192 mg/dL — ABNORMAL HIGH (ref 70–99)
Potassium: 4.8 mmol/L (ref 3.5–5.1)
Sodium: 129 mmol/L — ABNORMAL LOW (ref 135–145)
Total Bilirubin: 0.4 mg/dL (ref 0.3–1.2)
Total Protein: 6.6 g/dL (ref 6.5–8.1)

## 2022-11-01 LAB — SAMPLE TO BLOOD BANK

## 2022-11-01 LAB — IRON AND TIBC
Iron: 42 ug/dL — ABNORMAL LOW (ref 45–182)
Saturation Ratios: 18 % (ref 17.9–39.5)
TIBC: 237 ug/dL — ABNORMAL LOW (ref 250–450)
UIBC: 195 ug/dL

## 2022-11-02 ENCOUNTER — Encounter (HOSPITAL_COMMUNITY): Payer: Self-pay | Admitting: Diagnostic Radiology

## 2022-11-02 ENCOUNTER — Inpatient Hospital Stay (HOSPITAL_BASED_OUTPATIENT_CLINIC_OR_DEPARTMENT_OTHER): Payer: Medicare HMO | Admitting: Oncology

## 2022-11-02 ENCOUNTER — Encounter: Payer: Self-pay | Admitting: Oncology

## 2022-11-02 VITALS — BP 109/51 | HR 62 | Temp 97.7°F | Resp 18 | Wt 224.4 lb

## 2022-11-02 DIAGNOSIS — N1832 Chronic kidney disease, stage 3b: Secondary | ICD-10-CM | POA: Diagnosis not present

## 2022-11-02 DIAGNOSIS — D649 Anemia, unspecified: Secondary | ICD-10-CM | POA: Diagnosis not present

## 2022-11-02 DIAGNOSIS — C61 Malignant neoplasm of prostate: Secondary | ICD-10-CM

## 2022-11-02 DIAGNOSIS — G620 Drug-induced polyneuropathy: Secondary | ICD-10-CM

## 2022-11-02 DIAGNOSIS — T451X5A Adverse effect of antineoplastic and immunosuppressive drugs, initial encounter: Secondary | ICD-10-CM

## 2022-11-02 NOTE — Assessment & Plan Note (Signed)
Encourage oral hydration and avoid nephrotoxins.   

## 2022-11-02 NOTE — Assessment & Plan Note (Signed)
Grade 1 Observation.  

## 2022-11-02 NOTE — Assessment & Plan Note (Addendum)
Anemia is likely secondary to bone marrow suppression as well as anemia due to CKD. Iron panel has improved, ferritin is elevated, could be falsely increased due to recent urinary retention, HOLEP procedure. ,  He has received IV Venofer x 4 in May.  I recommend to start retacrit 30,000 units.  Rationale and side effects were reviewed with patient.   Recheck H&H in 2 weeks.  Follow up in 4 weeks.

## 2022-11-02 NOTE — Assessment & Plan Note (Addendum)
Prostate cancer, Gleason score 10, s/p RT[2022]-  castration resistance  biochemical recurrence 06/30/2021 06/15/2022-bone metastasis  ADT therapy 08/15/2022 Eligard 45mg  Q6 months.- next due in Oct 2024  Labs are reviewed and discussed with patient and wife.  Patient has received total 3 cycles of docetaxel.  He did not tolerate due to recurrent severe anemia. PSA has not responded to treatment.  Progressively increased, now in 1000s PMSA-showed multiple osseous disease.  Other options include [id number], refer to Dr. Amil Amen, discussed case with him.

## 2022-11-02 NOTE — Progress Notes (Signed)
Hematology/Oncology Progress note [phone] [phone]      CHIEF COMPLAINTS/REASON FOR VISIT:  Metastatic prostate cancer  ASSESSMENT & PLAN:   Cancer Staging  Prostate cancer [facility]) Staging form: Prostate, AJCC 8th Edition - Clinical: Stage Unknown (rcTX, cNX) - Signed by Rickard Patience, MD on 07/13/2021 - Pathologic stage from 06/16/2022: Stage IVB (pNX, cM1b, Grade Group: 5) - Signed by Rickard Patience, MD on 06/16/2022   Prostate cancer [facility]) Prostate cancer, Gleason score 10, s/p RT[2022]-  castration resistance  biochemical recurrence 06/30/2021 06/15/2022-bone metastasis  ADT therapy 08/15/2022 Eligard 45mg  Q6 months.- next due in Oct 2024  Labs are reviewed and discussed with patient and wife.  Patient has received total 3 cycles of docetaxel.  He did not tolerate due to recurrent severe anemia. PSA has not responded to treatment.  Progressively increased, now in 1000s PMSA-showed multiple osseous disease.  Other options include [id number], refer to Dr. Amil Amen, discussed case with him.   CKD (chronic kidney disease) stage 3, GFR 30-59 ml/min (HCC) Encourage oral hydration and avoid nephrotoxins.    Normocytic anemia Anemia is likely secondary to bone marrow suppression as well as anemia due to CKD. Iron panel has improved, ferritin is elevated, could be falsely increased due to recent urinary retention, HOLEP procedure. ,  He has received IV Venofer x 4 in May.  I recommend to start retacrit 30,000 units.  Rationale and side effects were reviewed with patient.   Recheck H&H in 2 weeks.  Follow up in 4 weeks.    Chemotherapy-induced neuropathy (HCC) Grade 1 Observation.   Orders Placed This Encounter  Procedures   Hemoglobin and Hematocrit, Blood    Standing Status:   Future    Standing Expiration Date:   11/02/2023   CBC with Differential (Cancer Center Only)    Standing Status:   Future    Standing Expiration Date:   11/02/2023   CMP (Cancer Center  only)    Standing Status:   Future    Standing Expiration Date:   11/02/2023   PSA    Standing Status:   Future    Standing Expiration Date:   11/02/2023   Iron and TIBC    Standing Status:   Future    Standing Expiration Date:   11/02/2023   Ferritin    Standing Status:   Future    Standing Expiration Date:   11/02/2023    Follow up 1 month  All questions were answered. The patient knows to call the clinic with any problems, questions or concerns.  Rickard Patience, MD, PhD [facility] Hematology Oncology 11/02/2022   HISTORY OF PRESENTING ILLNESS:   Keith Howe is a  82 y.o.  male presents for follow up for prostate cancer Oncology History  Prostate cancer (HCC)  02/24/2020 Tumor Marker   PSA at Dr..Wolff's office 5.7.Marland Kitchen   03/17/2020 Imaging    MRI of the prostate showed PI-RADS category 5 lesion of the left posterolateral and posteromedial peripheral zone in the base, mid gland, and apex, with involvement of the left central zone at the base. This lesion bulges the prosthetic capsular margin adjacent to the rectum, and there is a high suspicion for extraprostatic spread and early seminal vesicle invasion and likely left neurovascular bundle involvement. PI-RADS category 2 lesion in the right peripheral zone,considered PI-RADS category 2 given the nonfocal nature.. Encapsulated nodularity in the transition zone compatible with benign prostatic hypertrophy. Prostate volume 71.50 cubic cm.    04/08/2020 Initial Diagnosis  Prostate cancer [facility]) prostate cancer history dated back to 04/08/20.  Prostate biopsy showed prostate adenocarcinoma, 12 out of 12 cores involved.  Gleason score 10 [5+5] 2 cores, Gleason 9 [5+4] 1 core, Gleason 9 [4+5] involving 8 cores.     05/13/2020 Imaging   CT abdomen pelvis without contrast showed moderately enlarged prostate, no evidence of abdominal or pelvic metastatic disease.    05/13/2020 Imaging   bone scan whole body showed no evidence of osseous  metastasis    06/04/2020 -  Radiation Therapy   patient was seen by Dr. Rushie Chestnut and underwent IMRT to prostate and pelvic nodes.  Dr. Rushie Chestnut asked Dr. Evelene Croon to give adjuvant deprivation therapy.    12/29/2020 Tumor Marker   PSA 0.07   06/22/2021 -  Chemotherapy   Orgovyx 120mg  daily Xtandi 160mg  daily   07/01/2021 Tumor Marker   PSA 4.3 Patient was referred by Dr. Rushie Chestnut to reestablish care with oncology for evaluation.  He was previously seen by me in the hematology clinic for abnormal SPEP and lost follow-up.  Oncology was not involved previously for treatment of prostate cancer.    07/13/2021 Cancer Staging   Staging form: Prostate, AJCC 8th Edition - Clinical: Stage Unknown (rcTX, cNX) - Signed by Rickard Patience, MD on 07/13/2021 Stage prefix: Recurrence   10/04/2021 Tumor Marker   PSA at Urologist office  <0.1   01/02/2022 Tumor Marker   PSA at urologist office 0.5   05/26/2022 Tumor Marker   PSA 9.59  Previous prostate cancer treatment was with Dr. Evelene Croon.  05/26/2022  Patient presented to reestablish care as Dr. Evelene Croon retired   06/15/2022 Imaging   PMSA showed Several radiotracer avid bone metastases in the left scapula, thoracic spine, and bilateral ribs. No evidence of soft tissue metastatic disease. Stable enlarged prostate, with findings of chronic bladder outlet obstruction and vesicoureteral reflux. Aortic Atherosclerosis     06/16/2022 Cancer Staging   Staging form: Prostate, AJCC 8th Edition - Pathologic stage from 06/16/2022: Stage IVB (pNX, cM1b, Grade Group: 5) - Signed by Rickard Patience, MD on 06/16/2022 Histologic grading system: 5 grade system   06/16/2022 Tumor Marker   PSA  21.45   06/28/2022 - 08/25/2022 Chemotherapy   Patient is on Treatment Plan : PROSTATE Docetaxel (75)     07/31/2022 - 08/02/2022 Hospital Admission   Admitted due to near syncope, Hb 6.9, s/p PRBC transfusion x 2 units.  No bleeding history. SIRS, infectious work up negative. AKI on CKD.       INTERVAL HISTORY Keith Howe is a 82 y.o. male who has above history reviewed by me today presents for follow up visit for prostate cancer. Today patient reports feeling ok. + fatigue He has established care with Dr. Richardo Hanks, s/p HOLEP procedure.   Review of Systems  Constitutional:  Positive for fatigue. Negative for appetite change, chills, fever and unexpected weight change.  HENT:   Negative for hearing loss and voice change.   Eyes:  Negative for eye problems and icterus.  Respiratory:  Negative for chest tightness, cough and shortness of breath.   Cardiovascular:  Negative for chest pain and leg swelling.  Gastrointestinal:  Negative for abdominal distention and abdominal pain.       Urinary retention.   Endocrine: Negative for hot flashes.  Genitourinary:  Negative for difficulty urinating, dysuria and frequency.   Musculoskeletal:  Negative for arthralgias.  Skin:  Negative for itching and rash.  Neurological:  Negative for light-headedness and numbness.  Hematological:  Negative for adenopathy. Does not bruise/bleed easily.  Psychiatric/Behavioral:  Negative for confusion.     MEDICAL HISTORY:  Past Medical History:  Diagnosis Date   BPH (benign prostatic hyperplasia)    Cancer (HCC)    Chronic kidney disease    STAGE 3   Diabetes mellitus without complication (HCC)    HOH (hard of hearing)    AIDS   Hypertension    Inguinal hernia    Pain    CHRONIC LBP   Prostate cancer [facility])     SURGICAL HISTORY: Past Surgical History:  Procedure Laterality Date   BACK SURGERY     Lumbar   CATARACT EXTRACTION W/PHACO Right 07/10/2017   Procedure: CATARACT EXTRACTION PHACO AND INTRAOCULAR LENS PLACEMENT (IOC);  Surgeon: Galen Manila, MD;  Location: [facility] ORS;  Service: Ophthalmology;  Laterality: Right;  [place] 00:29.2 AP% 16.5 CDE 4.83 Fluid Pack Lot # [id number]   COLONOSCOPY     EYE SURGERY Bilateral    cataract extraction   HERNIA REPAIR     INGUINAL    PROSTATE BIOPSY N/A 04/08/2020   Procedure: PROSTATE BIOPSY Addison Bailey;  Surgeon: Orson Ape, MD;  Location: [facility] ORS;  Service: Urology;  Laterality: N/A;   TOTAL KNEE ARTHROPLASTY Left 03/19/2018   Procedure: TOTAL KNEE ARTHROPLASTY;  Surgeon: Juanell Fairly, MD;  Location: [facility] ORS;  Service: Orthopedics;  Laterality: Left;    SOCIAL HISTORY: Social History   Socioeconomic History   Marital status: Married    Spouse name: Not on file   Number of children: Not on file   Years of education: Not on file   Highest education level: Some college, no degree  Occupational History   Not on file  Tobacco Use   Smoking status: Never    Passive exposure: Never   Smokeless tobacco: Never  Vaping Use   Vaping Use: Never used  Substance and Sexual Activity   Alcohol use: No   Drug use: Never   Sexual activity: Not Currently  Other Topics Concern   Not on file  Social History Narrative   Independent at baseline.  Lives at home with his wife   Social Determinants of Health   Financial Resource Strain: Low Risk  (05/12/2021)   Overall Financial Resource Strain (CARDIA)    Difficulty of Paying Living Expenses: Not hard at all  Food Insecurity: No Food Insecurity (09/25/2022)   Hunger Vital Sign    Worried About Running Out of Food in the Last Year: Never true    Ran Out of Food in the Last Year: Never true  Transportation Needs: No Transportation Needs (09/25/2022)   PRAPARE - Administrator, Civil Service (Medical): No    Lack of Transportation (Non-Medical): No  Physical Activity: Insufficiently Active (05/12/2021)   Exercise Vital Sign    Days of Exercise per Week: 7 days    Minutes of Exercise per Session: 10 min  Stress: No Stress Concern Present (05/12/2021)   [employer] of Occupational Health - Occupational Stress Questionnaire    Feeling of Stress : Not at all  Social Connections: Socially Integrated (05/12/2021)   Social Connection and Isolation Panel [NHANES]     Frequency of Communication with Friends and Family: More than three times a week    Frequency of Social Gatherings with Friends and Family: Once a week    Attends Religious Services: More than 4 times per year    Active Member of [employer] or Organizations: Yes    Attends [employer]  or Organization Meetings: More than 4 times per year    Marital Status: Married  Catering manager Violence: Not At Risk (09/25/2022)   Humiliation, Afraid, Rape, and Kick questionnaire    Fear of Current or Ex-Partner: No    Emotionally Abused: No    Physically Abused: No    Sexually Abused: No    FAMILY HISTORY: Family History  Problem Relation Age of Onset   Diabetes Father    Breast cancer Sister     ALLERGIES:  has No Known Allergies.  MEDICATIONS:  Current Outpatient Medications  Medication Sig Dispense Refill   aspirin EC 81 MG tablet Take 81 mg by mouth daily.     cholecalciferol (VITAMIN D) 1000 units tablet Take 1,000 Units by mouth daily.     dapagliflozin propanediol (FARXIGA) 10 MG TABS tablet Take 10 mg by mouth daily.     Iron-Vitamin C 65-125 MG TABS Take 1 tablet by mouth daily. 30 tablet 3   loratadine (CLARITIN) 10 MG tablet Take 10 mg by mouth once daily before chemo and at least 2 days after injection.     losartan (COZAAR) 100 MG tablet Take 100 mg by mouth daily.     lovastatin (MEVACOR) 40 MG tablet TAKE 1 TABLET BY MOUTH EVERY DAY 90 tablet 3   metoprolol tartrate (LOPRESSOR) 50 MG tablet TAKE 1 TABLET BY MOUTH EVERY 12 HOURS 180 tablet 2   Misc Natural Products (PROSTATE SUPPORT PO) Take 2 capsules by mouth daily.     NOVOLOG FLEXPEN 100 UNIT/ML FlexPen Inject into the skin. Per sliding scale  0   Omega-3 Fatty Acids (FISH OIL PO) Take 1 capsule by mouth daily.      TRESIBA FLEXTOUCH 200 UNIT/ML SOPN Inject 80 Units into the muscle daily before breakfast.   2   TRULICITY 1.5 MG/0.5ML SOPN Inject into the skin.     vitamin B-12 (CYANOCOBALAMIN) 500 MCG tablet Take 500 mcg by mouth  daily.     No current facility-administered medications for this visit.     PHYSICAL EXAMINATION: ECOG PERFORMANCE STATUS: 1 - Symptomatic but completely ambulatory Vitals:   11/02/22 1355  BP: (!) 109/51  Pulse: 62  Resp: 18  Temp: 97.7 F (36.5 C)  SpO2: 100%   Filed Weights   11/02/22 1355  Weight: 224 lb 6.4 oz (101.8 kg)     Physical Exam Constitutional:      General: He is not in acute distress. HENT:     Head: Normocephalic and atraumatic.  Eyes:     General: No scleral icterus. Cardiovascular:     Rate and Rhythm: Normal rate and regular rhythm.  Pulmonary:     Effort: Pulmonary effort is normal. No respiratory distress.     Breath sounds: No wheezing.  Abdominal:     General: Bowel sounds are normal. There is no distension.     Palpations: Abdomen is soft.  Genitourinary:    Comments: + foley catheter.  Musculoskeletal:        General: Normal range of motion.     Cervical back: Normal range of motion and neck supple.  Skin:    General: Skin is warm.     Findings: No erythema or rash.  Neurological:     Mental Status: He is alert and oriented to person, place, and time. Mental status is at baseline.     Cranial Nerves: No cranial nerve deficit.     Coordination: Coordination normal.  Psychiatric:  Mood and Affect: Mood normal.     LABORATORY DATA:  I have reviewed the data as listed    Latest Ref Rng & Units 11/01/2022    8:46 AM 10/13/2022    4:31 PM 10/04/2022    1:31 PM  CBC  WBC 4.0 - 10.5 K/uL 9.0  6.8  7.1   Hemoglobin 13.0 - 17.0 g/dL 7.2  9.4  16.0   Hematocrit 39.0 - 52.0 % 22.6  30.2  31.8   Platelets 150 - 400 K/uL 202  214  252       Latest Ref Rng & Units 11/01/2022    8:46 AM 10/13/2022    4:31 PM 10/04/2022    1:31 PM  CMP  Glucose 70 - 99 mg/dL 737  106  269   BUN 8 - 23 mg/dL 35  24  23   Creatinine 0.61 - 1.24 mg/dL 4.85  4.62  7.03   Sodium 135 - 145 mmol/L 129  137  134   Potassium 3.5 - 5.1 mmol/L 4.8  4.2  4.1    Chloride 98 - 111 mmol/L 93  102  98   CO2 22 - 32 mmol/L 23  24  26    Calcium 8.9 - 10.3 mg/dL 9.4  8.9  8.9   Total Protein 6.5 - 8.1 g/dL 6.6  6.3  6.7   Total Bilirubin 0.3 - 1.2 mg/dL 0.4  0.3  0.2   Alkaline Phos 38 - 126 U/L 67  69  64   AST 15 - 41 U/L 60  27  21   ALT 0 - 44 U/L 23  17  20      RADIOGRAPHIC STUDIES: I have personally reviewed the radiological images as listed and agreed with the findings in the report. CT Renal Stone Study  Result Date: 10/13/2022 CLINICAL DATA:  Hematuria.  History of prostate carcinoma. EXAM: CT ABDOMEN AND PELVIS WITHOUT CONTRAST TECHNIQUE: Multidetector CT imaging of the abdomen and pelvis was performed following the standard protocol without IV contrast. RADIATION DOSE REDUCTION: This exam was performed according to the departmental dose-optimization program which includes automated exposure control, adjustment of the mA and/or kV according to patient size and/or use of iterative reconstruction technique. COMPARISON:  09/20/2022.  PET scan on 10/09/2022. FINDINGS: Lower chest: No acute abnormality. Hepatobiliary: No focal liver abnormality is seen. Status post cholecystectomy. No biliary dilatation. Pancreas: Unremarkable. No pancreatic ductal dilatation or surrounding inflammatory changes. Spleen: Normal in size without focal abnormality. Adrenals/Urinary Tract: No adrenal masses. Bilateral hydronephrosis has resolved since the prior CT after decompression of the bladder. The bladder contains a Foley catheter. The bladder demonstrates diffuse circumferential wall thickening as well as some internal higher density material consistent with clot. Stomach/Bowel: Bowel shows no evidence of obstruction, ileus, inflammation or lesion. The appendix is normal. No free intraperitoneal air. Vascular/Lymphatic: No significant vascular findings are present. No enlarged abdominal or pelvic lymph nodes. Reproductive: Stable prostate enlargement. Other: Stable right  inguinal hernia containing fat. Musculoskeletal: Bone lesions seen by PET scan are difficult to see by CT. There is suggestion of multiple very subtle lytic lesions in several lumbar vertebral bodies, the most conspicuous in L4. No pathologic fracture identified. IMPRESSION: 1. Resolution of bilateral hydronephrosis after decompression of the bladder. The bladder contains a Foley catheter and demonstrates diffuse circumferential wall thickening as well as some internal higher density material consistent with clot. 2. Stable prostate enlargement. 3. Stable right inguinal hernia containing fat. 4. Bone lesions seen by  PET scan are difficult to see by CT. There is suggestion of multiple very subtle lytic lesions in several lumbar vertebral bodies, the most conspicuous in L4. No pathologic fracture identified. Electronically Signed   By: Irish Lack M.D.   On: 10/13/2022 17:31   NM PET (PSMA) SKULL TO MID THIGH  Result Date: 10/11/2022 CLINICAL DATA:  Follow-up prostate carcinoma. Assess response to treatment. EXAM: NUCLEAR MEDICINE PET SKULL BASE TO THIGH TECHNIQUE: 9.7 mCi F18 Piflufolastat (Pylarify) was injected intravenously. Full-ring PET imaging was performed from the skull base to thigh after the radiotracer. CT data was obtained and used for attenuation correction and anatomic localization. COMPARISON:  None Available. FINDINGS: NECK No radiotracer avid lymph nodes in the neck. Incidental CT finding: None. CHEST No radiotracer accumulation within mediastinal or hilar lymph nodes. No suspicious pulmonary nodules on the CT scan. Incidental CT finding: None. ABDOMEN/PELVIS Prostate: No focal activity in prostate gland. Foley catheter extends through the urethra. Lymph nodes: No abnormal radiotracer accumulation within pelvic or abdominal nodes. Liver: No evidence of liver metastasis. Incidental CT finding: Thickened urinary bladder wall with Foley catheter in place. SKELETON Multiple radiotracer avid  lesions within the axillary appendicular skeleton. These lesions are intense and numerous and essentially occult by CT imaging. Lesions involve the pelvis, sacrum, every vertebral body level, ribs, shoulder girdles, and sternum. Lesions of also involve the proximal femurs and humeri. Example lesion within the sternum with SUV max equal 33.1 on image 68. Example lesion in the posterior RIGHT iliac bone with SUV max equal 23.7. Multiple lesions in the proximal LEFT and RIGHT femur. Example cluster of lesions in the proximal RIGHT femur with SUV max equal 30 on image 7. There approximately 100 lesions. The number of lesions is increased dramatically from comparison PSMA scan IMPRESSION: 1. Innumerable radiotracer avid skeletal metastasis throughout the axial and appendicular skeleton. The lesions are essentially occult by CT imaging. The number of lesions is increased dramatically from comparison PSMA scan. 2. No evidence of visceral metastasis or nodal metastasis. Electronically Signed   By: Genevive Bi M.D.   On: 10/11/2022 14:50

## 2022-11-03 ENCOUNTER — Telehealth: Payer: Self-pay

## 2022-11-03 ENCOUNTER — Inpatient Hospital Stay: Payer: Medicare HMO

## 2022-11-03 ENCOUNTER — Other Ambulatory Visit: Payer: Self-pay

## 2022-11-03 ENCOUNTER — Ambulatory Visit: Payer: Medicare HMO

## 2022-11-03 VITALS — BP 119/50

## 2022-11-03 DIAGNOSIS — C61 Malignant neoplasm of prostate: Secondary | ICD-10-CM | POA: Diagnosis not present

## 2022-11-03 MED ORDER — EPOETIN ALFA 10000 UNIT/ML IJ SOLN
30000.0000 [IU] | Freq: Once | INTRAMUSCULAR | Status: AC
  Administered 2022-11-03: 30000 [IU] via SUBCUTANEOUS
  Filled 2022-11-03: qty 3

## 2022-11-03 NOTE — Telephone Encounter (Signed)
VM left on Susan's VM stating that Dr. Cathie Hoops would like to keep PT as is. Pt is most likely weak due to low hemoglobin and she does not recommend increased activity with low hemoglobin, at the moment.

## 2022-11-03 NOTE — Telephone Encounter (Signed)
Pervis Hocking from [facility] called requesting that this patients PT orders be increased in frequency due to the patient being weak. She states that right now he is to be seen once a week but she is requesting that he be seen twice a week for 4 weeks and then once a week for 4 weeks. She states that you can call her back and leave her message stating if this is okay or not, her number is [phone].

## 2022-11-03 NOTE — Telephone Encounter (Signed)
Requested orders have been placed in EPIC.

## 2022-11-03 NOTE — Telephone Encounter (Signed)
-----   Message from Sherlean Foot sent at 11/03/2022  9:04 AM EDT ----- Regarding: RE: Pluvicto Candidate Yes, the order can be entered in EPIC, but you can also fax. Which ever is easiest for you. If you create in EPIC please be sure Dr. Cathie Hoops signs. The 2 orders we'll need for now are:  NM radiologist eval and mgmt    &   NM pluvicto administration.  Also, will you be the contact nurse I can reach out to if I have any questions? I'll need to send some paperwork with information I need about the patient's condition, CPT codes and NPI info, etc......   NM Fax: [phone] ----- Message ----- From: Coralee Rud, RN Sent: 11/03/2022   8:56 AM EDT To: Sherlean Foot Subject: RE: Pluvicto Candidate                         Good morning Luna Kitchens, is this a referral that can be entered in EPIC or do I have to fax the information. If so, will your provide fax number please.  ----- Message ----- From: Rickard Patience, MD Sent: 11/02/2022  10:07 PM EDT To: Coralee Rud, RN; Paulita Fujita, [place] Subject: Annell Greening: Pluvicto Candidate                         Please refer patient urgently to Radiology Dr. Corliss Marcus. - prostate cancer ----- Message ----- From: Lorna Few, MD Sent: 11/02/2022   3:32 PM EDT To: Rickard Patience, MD; Sherlean Foot Subject: RE: Pluvicto Candidate                         We can see Mr Laderrick.  I am worried about his renal function and anemia.  Typically my cut off for Cr is < 1.8.  He is approaching that (1.7).   Also severely anemic Hgb 7.... can you transfuse and encourage hydration.  Hopefully we can improve labs and treat.  Thanks Genevive Bi ----- Message ----- From: Sherlean Foot Sent: 10/31/2022   9:52 AM EDT To: Lorna Few, MD; Rickard Patience, MD Subject: Pluvicto Candidate                             Hi Dr. Amil Amen,   We received a request to administer Pluvicto therapy for the patient. Please evaluate to see if he's a good candidate before setting up  the consult.   Thank You!

## 2022-11-10 ENCOUNTER — Other Ambulatory Visit: Payer: Self-pay

## 2022-11-10 ENCOUNTER — Other Ambulatory Visit: Payer: Self-pay | Admitting: Oncology

## 2022-11-10 ENCOUNTER — Inpatient Hospital Stay
Admission: EM | Admit: 2022-11-10 | Discharge: 2022-11-16 | DRG: 812 | Disposition: A | Discharge status: Home Health | Payer: Medicare HMO | Attending: Internal Medicine | Admitting: Internal Medicine

## 2022-11-10 ENCOUNTER — Emergency Department: Payer: Medicare HMO

## 2022-11-10 DIAGNOSIS — B20 Human immunodeficiency virus [HIV] disease: Secondary | ICD-10-CM | POA: Diagnosis present

## 2022-11-10 DIAGNOSIS — I959 Hypotension, unspecified: Secondary | ICD-10-CM | POA: Diagnosis present

## 2022-11-10 DIAGNOSIS — C7951 Secondary malignant neoplasm of bone: Secondary | ICD-10-CM | POA: Diagnosis present

## 2022-11-10 DIAGNOSIS — G620 Drug-induced polyneuropathy: Secondary | ICD-10-CM | POA: Diagnosis present

## 2022-11-10 DIAGNOSIS — T83511A Infection and inflammatory reaction due to indwelling urethral catheter, initial encounter: Secondary | ICD-10-CM | POA: Diagnosis present

## 2022-11-10 DIAGNOSIS — E875 Hyperkalemia: Secondary | ICD-10-CM | POA: Diagnosis present

## 2022-11-10 DIAGNOSIS — S32020A Wedge compression fracture of second lumbar vertebra, initial encounter for closed fracture: Secondary | ICD-10-CM | POA: Insufficient documentation

## 2022-11-10 DIAGNOSIS — Z96652 Presence of left artificial knee joint: Secondary | ICD-10-CM | POA: Diagnosis present

## 2022-11-10 DIAGNOSIS — N1831 Chronic kidney disease, stage 3a: Secondary | ICD-10-CM | POA: Diagnosis present

## 2022-11-10 DIAGNOSIS — M8458XA Pathological fracture in neoplastic disease, other specified site, initial encounter for fracture: Secondary | ICD-10-CM | POA: Diagnosis present

## 2022-11-10 DIAGNOSIS — C61 Malignant neoplasm of prostate: Secondary | ICD-10-CM | POA: Diagnosis present

## 2022-11-10 DIAGNOSIS — Z961 Presence of intraocular lens: Secondary | ICD-10-CM | POA: Diagnosis present

## 2022-11-10 DIAGNOSIS — Y846 Urinary catheterization as the cause of abnormal reaction of the patient, or of later complication, without mention of misadventure at the time of the procedure: Secondary | ICD-10-CM | POA: Diagnosis present

## 2022-11-10 DIAGNOSIS — Z79899 Other long term (current) drug therapy: Secondary | ICD-10-CM

## 2022-11-10 DIAGNOSIS — E1165 Type 2 diabetes mellitus with hyperglycemia: Secondary | ICD-10-CM | POA: Diagnosis present

## 2022-11-10 DIAGNOSIS — N183 Chronic kidney disease, stage 3 unspecified: Secondary | ICD-10-CM | POA: Diagnosis present

## 2022-11-10 DIAGNOSIS — Z833 Family history of diabetes mellitus: Secondary | ICD-10-CM

## 2022-11-10 DIAGNOSIS — Z7982 Long term (current) use of aspirin: Secondary | ICD-10-CM

## 2022-11-10 DIAGNOSIS — D7589 Other specified diseases of blood and blood-forming organs: Secondary | ICD-10-CM | POA: Diagnosis present

## 2022-11-10 DIAGNOSIS — N400 Enlarged prostate without lower urinary tract symptoms: Secondary | ICD-10-CM | POA: Diagnosis present

## 2022-11-10 DIAGNOSIS — D649 Anemia, unspecified: Secondary | ICD-10-CM | POA: Diagnosis present

## 2022-11-10 DIAGNOSIS — Z794 Long term (current) use of insulin: Secondary | ICD-10-CM

## 2022-11-10 DIAGNOSIS — G8929 Other chronic pain: Secondary | ICD-10-CM | POA: Diagnosis present

## 2022-11-10 DIAGNOSIS — I129 Hypertensive chronic kidney disease with stage 1 through stage 4 chronic kidney disease, or unspecified chronic kidney disease: Secondary | ICD-10-CM | POA: Diagnosis present

## 2022-11-10 DIAGNOSIS — E11649 Type 2 diabetes mellitus with hypoglycemia without coma: Secondary | ICD-10-CM | POA: Diagnosis not present

## 2022-11-10 DIAGNOSIS — K5900 Constipation, unspecified: Secondary | ICD-10-CM | POA: Diagnosis present

## 2022-11-10 DIAGNOSIS — Z7984 Long term (current) use of oral hypoglycemic drugs: Secondary | ICD-10-CM

## 2022-11-10 DIAGNOSIS — Z1152 Encounter for screening for COVID-19: Secondary | ICD-10-CM

## 2022-11-10 DIAGNOSIS — D631 Anemia in chronic kidney disease: Secondary | ICD-10-CM | POA: Diagnosis present

## 2022-11-10 DIAGNOSIS — Z9842 Cataract extraction status, left eye: Secondary | ICD-10-CM

## 2022-11-10 DIAGNOSIS — D6481 Anemia due to antineoplastic chemotherapy: Secondary | ICD-10-CM | POA: Diagnosis not present

## 2022-11-10 DIAGNOSIS — I10 Essential (primary) hypertension: Secondary | ICD-10-CM | POA: Diagnosis present

## 2022-11-10 DIAGNOSIS — R6510 Systemic inflammatory response syndrome (SIRS) of non-infectious origin without acute organ dysfunction: Secondary | ICD-10-CM | POA: Diagnosis present

## 2022-11-10 DIAGNOSIS — Z923 Personal history of irradiation: Secondary | ICD-10-CM

## 2022-11-10 DIAGNOSIS — Z8546 Personal history of malignant neoplasm of prostate: Secondary | ICD-10-CM

## 2022-11-10 DIAGNOSIS — E1122 Type 2 diabetes mellitus with diabetic chronic kidney disease: Secondary | ICD-10-CM | POA: Diagnosis present

## 2022-11-10 DIAGNOSIS — Z7985 Long-term (current) use of injectable non-insulin antidiabetic drugs: Secondary | ICD-10-CM

## 2022-11-10 DIAGNOSIS — Z9841 Cataract extraction status, right eye: Secondary | ICD-10-CM

## 2022-11-10 DIAGNOSIS — I700 Atherosclerosis of aorta: Secondary | ICD-10-CM | POA: Diagnosis present

## 2022-11-10 DIAGNOSIS — M545 Low back pain, unspecified: Secondary | ICD-10-CM | POA: Diagnosis present

## 2022-11-10 DIAGNOSIS — Z978 Presence of other specified devices: Secondary | ICD-10-CM | POA: Insufficient documentation

## 2022-11-10 DIAGNOSIS — D619 Aplastic anemia, unspecified: Secondary | ICD-10-CM | POA: Diagnosis present

## 2022-11-10 DIAGNOSIS — N189 Chronic kidney disease, unspecified: Secondary | ICD-10-CM | POA: Diagnosis present

## 2022-11-10 DIAGNOSIS — T451X5A Adverse effect of antineoplastic and immunosuppressive drugs, initial encounter: Secondary | ICD-10-CM | POA: Diagnosis present

## 2022-11-10 DIAGNOSIS — R000 Tachycardia, unspecified: Secondary | ICD-10-CM | POA: Diagnosis present

## 2022-11-10 DIAGNOSIS — N12 Tubulo-interstitial nephritis, not specified as acute or chronic: Secondary | ICD-10-CM | POA: Diagnosis present

## 2022-11-10 LAB — BASIC METABOLIC PANEL
Anion gap: 11 (ref 5–15)
BUN: 40 mg/dL — ABNORMAL HIGH (ref 8–23)
CO2: 22 mmol/L (ref 22–32)
Calcium: 8.9 mg/dL (ref 8.9–10.3)
Chloride: 101 mmol/L (ref 98–111)
Creatinine, Ser: 1.27 mg/dL — ABNORMAL HIGH (ref 0.61–1.24)
GFR, Estimated: 57 mL/min — ABNORMAL LOW (ref >60)
Glucose, Bld: 206 mg/dL — ABNORMAL HIGH (ref 70–99)
Potassium: 4.9 mmol/L (ref 3.5–5.1)
Sodium: 134 mmol/L — ABNORMAL LOW (ref 135–145)

## 2022-11-10 LAB — CBG MONITORING, ED: Glucose-Capillary: 174 mg/dL — ABNORMAL HIGH (ref 70–99)

## 2022-11-10 LAB — CBC
HCT: 18.3 % — ABNORMAL LOW (ref 39.0–52.0)
Hemoglobin: 5.6 g/dL — ABNORMAL LOW (ref 13.0–17.0)
MCH: 26.8 pg (ref 26.0–34.0)
MCHC: 30.6 g/dL (ref 30.0–36.0)
MCV: 87.6 fL (ref 80.0–100.0)
Platelets: 169 10*3/uL (ref 150–400)
RBC: 2.09 MIL/uL — ABNORMAL LOW (ref 4.22–5.81)
RDW: 19.5 % — ABNORMAL HIGH (ref 11.5–15.5)
WBC: 10.2 10*3/uL (ref 4.0–10.5)
nRBC: 3 % — ABNORMAL HIGH (ref 0.0–0.2)

## 2022-11-10 LAB — TYPE AND SCREEN

## 2022-11-10 MED ORDER — OXYCODONE HCL 5 MG PO TABS: 5.0000 mg | ORAL_TABLET | Freq: Three times a day (TID) | ORAL | 0 refills | Status: DC | PRN

## 2022-11-10 MED ORDER — SODIUM CHLORIDE 0.9 % IV BOLUS
500.0000 mL | Freq: Once | INTRAVENOUS | Status: AC
  Administered 2022-11-10: 500 mL via INTRAVENOUS

## 2022-11-10 MED ORDER — ONDANSETRON HCL 4 MG/2ML IJ SOLN
4.0000 mg | Freq: Once | INTRAMUSCULAR | Status: AC
  Administered 2022-11-10: 4 mg via INTRAVENOUS
  Filled 2022-11-10: qty 2

## 2022-11-10 MED ORDER — MORPHINE SULFATE (PF) 4 MG/ML IV SOLN
4.0000 mg | INTRAVENOUS | Status: DC | PRN
  Administered 2022-11-10 – 2022-11-11 (×2): 4 mg via INTRAVENOUS
  Filled 2022-11-10 (×2): qty 1

## 2022-11-10 MED ORDER — SODIUM CHLORIDE 0.9 % IV SOLN
10.0000 mL/h | Freq: Once | INTRAVENOUS | Status: AC
  Administered 2022-11-11: 10 mL/h via INTRAVENOUS

## 2022-11-10 MED ORDER — IOHEXOL 300 MG/ML  SOLN
100.0000 mL | Freq: Once | INTRAMUSCULAR | Status: AC | PRN
  Administered 2022-11-10: 100 mL via INTRAVENOUS

## 2022-11-10 NOTE — ED Triage Notes (Signed)
FIRST NURSE NOTE:  Pt arrived via [facility] from home, c/o enlarged prostate, pt has foley with blood tinged urine, CBG 386, c/o back pain that is chronic in nature, pt hypotensive with medic on scence  500CC LR given 145/82 recent BP Afib 100-115  Temp 97.3

## 2022-11-10 NOTE — Progress Notes (Signed)
Received a call from home health team about back pain not being controlled with aleve. He has CKD. I have asked him to stop aleve and use prn tylenol. Oxycodone 5 mg Q8 prn 30 tab also sent to pharmacy. Primary team needs to f/u  Dr. Owens Shark, MD, MPH [facility] [phone] 11/10/2022 6:15 PM

## 2022-11-10 NOTE — ED Triage Notes (Signed)
Pt to ed from home via [facility] for hyperglycemia and hypotension. EMS was called. EMS treated and transported. Pt has foley in place.  Pt is caox4 and in some mild discomfort in triage,

## 2022-11-10 NOTE — ED Provider Notes (Signed)
[facility] Provider Note    Event Date/Time   First MD Initiated Contact with Patient 11/10/22 2149     (approximate)   History   Hyperglycemia   HPI  Keith Howe is a 82 y.o. male extensive past medical history including type 2 diabetes, CKD, prostate cancer presents to the ER for evaluation of worsening back pain as well as low blood pressures.  Has indwelling Foley.  Reportedly had physical therapy evaluated patient today and was found to be hypotensive.  Complaining of worsening pain.  They tried to manage with additional prescription for oxycodone at home without improvement.  Patient having significant discomfort with any ambulation or movement.  The symptoms have been ongoing for several weeks now but progressively worsening.  He denies any hematochezia or melena.  No nosebleeds.  Denies any hematuria.  No fevers.     Physical Exam   Triage Vital Signs: ED Triage Vitals  Enc Vitals Group     BP 11/10/22 2150 (!) 109/52     Pulse Rate 11/10/22 2150 (!) 110     Resp 11/10/22 2150 20     Temp 11/10/22 2150 98.9 F (37.2 C)     Temp Source 11/10/22 2150 Oral     SpO2 11/10/22 2150 100 %     Weight 11/10/22 2148 222 lb 10.6 oz (101 kg)     Height 11/10/22 2148 6\' 1"  (1.854 m)     Head Circumference --      Peak Flow --      Pain Score 11/10/22 2148 10     Pain Loc --      Pain Edu? --      Excl. in GC? --     Most recent vital signs: Vitals:   11/10/22 2150 11/10/22 2230  BP: (!) 109/52 (!) 103/53  Pulse: (!) 110 100  Resp: 20 16  Temp: 98.9 F (37.2 C)   SpO2: 100% 100%     Constitutional: Alert  Eyes: Conjunctivae are normal.  Head: Atraumatic. Nose: No congestion/rhinnorhea. Mouth/Throat: Mucous membranes are moist.   Neck: Painless ROM.  Cardiovascular:   Good peripheral circulation. Respiratory: Normal respiratory effort.  No retractions.  Gastrointestinal: Soft and nontender.  Musculoskeletal:  no  deformity Neurologic:  MAE spontaneously. No gross focal neurologic deficits are appreciated.  Skin:  Skin is warm, dry and intact. No rash noted. Psychiatric: Mood and affect are normal. Speech and behavior are normal.    ED Results / Procedures / Treatments   Labs (all labs ordered are listed, but only abnormal results are displayed) Labs Reviewed  BASIC METABOLIC PANEL - Abnormal; Notable for the following components:      Result Value   Sodium 134 (*)    Glucose, Bld 206 (*)    BUN 40 (*)    Creatinine, Ser 1.27 (*)    GFR, Estimated 57 (*)    All other components within normal limits  CBC - Abnormal; Notable for the following components:   RBC 2.09 (*)    Hemoglobin 5.6 (*)    HCT 18.3 (*)    RDW 19.5 (*)    nRBC 3.0 (*)    All other components within normal limits  CBG MONITORING, ED - Abnormal; Notable for the following components:   Glucose-Capillary 174 (*)    All other components within normal limits  URINALYSIS, ROUTINE W REFLEX MICROSCOPIC  CBG MONITORING, ED  TYPE AND SCREEN  PREPARE RBC (CROSSMATCH)  TYPE AND  SCREEN     EKG  ED ECG REPORT I, Willy Eddy, the attending physician, personally viewed and interpreted this ECG.   Date: 11/10/2022  EKG Time: 21:56  Rate: 100  Rhythm: sinus  Axis: normal  Intervals:normal  ST&T Change: no stemi, no depressions    RADIOLOGY Please see ED Course for my review and interpretation.  I personally reviewed all radiographic images ordered to evaluate for the above acute complaints and reviewed radiology reports and findings.  These findings were personally discussed with the patient.  Please see medical record for radiology report.    PROCEDURES:  Critical Care performed: Yes, see critical care procedure note(s)  .Critical Care  Performed by: Willy Eddy, MD Authorized by: Willy Eddy, MD   Critical care provider statement:    Critical care time (minutes):  35   Critical care was  necessary to treat or prevent imminent or life-threatening deterioration of the following conditions:  Circulatory failure   Critical care was time spent personally by me on the following activities:  Ordering and performing treatments and interventions, ordering and review of laboratory studies, ordering and review of radiographic studies, pulse oximetry, re-evaluation of patient's condition, review of old charts, obtaining history from patient or surrogate, examination of patient, evaluation of patient's response to treatment, discussions with primary provider, discussions with consultants and development of treatment plan with patient or surrogate    MEDICATIONS ORDERED IN ED: Medications  morphine (PF) 4 MG/ML injection 4 mg (4 mg Intravenous Given 11/10/22 2258)  0.9 %  sodium chloride infusion (has no administration in time range)  ondansetron (ZOFRAN) injection 4 mg (4 mg Intravenous Given 11/10/22 2258)  sodium chloride 0.9 % bolus 500 mL (500 mLs Intravenous New Bag/Given 11/10/22 2259)  iohexol (OMNIPAQUE) 300 MG/ML solution 100 mL (100 mLs Intravenous Contrast Given 11/10/22 2319)     IMPRESSION / MDM / ASSESSMENT AND PLAN / ED COURSE  I reviewed the triage vital signs and the nursing notes.                              Differential diagnosis includes, but is not limited to, metastatic prostate cancer, compression fracture, AAA, acute blood loss anemia, electrolyte abnormality, sepsis, hydronephrosis, retention, colitis  Patient presenting to the ER for evaluation of symptoms as described above.  Based on symptoms, risk factors and considered above differential, this presenting complaint could reflect a potentially life-threatening illness therefore the patient will be placed on continuous pulse oximetry and telemetry for monitoring.  Laboratory evaluation will be sent to evaluate for the above complaints.  Will order CT imaging will order IV pain medication.  Does have noted new anemia  suspected secondary to chronic disease will order blood transfusion.    Clinical Course as of 11/10/22 2340  Fri Nov 10, 2022  2337 CT abdomen pelvis on my review and interpretation without evidence of obstruction.  Given his acute anemia will consult hospitalist for admission.  Concerned that his back pain is secondary to lytic lesions will await formal radiology report. [PR]    Clinical Course User Index [PR] Willy Eddy, MD     FINAL CLINICAL IMPRESSION(S) / ED DIAGNOSES   Final diagnoses:  Symptomatic anemia  Acute midline low back pain without sciatica     Rx / DC Orders   ED Discharge Orders     None        Note:  This document was prepared using  Dragon Chemical engineer and may include unintentional dictation errors.    Willy Eddy, MD 11/10/22 [phone]

## 2022-11-11 DIAGNOSIS — N400 Enlarged prostate without lower urinary tract symptoms: Secondary | ICD-10-CM | POA: Diagnosis present

## 2022-11-11 DIAGNOSIS — G8929 Other chronic pain: Secondary | ICD-10-CM | POA: Diagnosis present

## 2022-11-11 DIAGNOSIS — N1831 Chronic kidney disease, stage 3a: Secondary | ICD-10-CM | POA: Diagnosis present

## 2022-11-11 DIAGNOSIS — N183 Chronic kidney disease, stage 3 unspecified: Secondary | ICD-10-CM | POA: Diagnosis not present

## 2022-11-11 DIAGNOSIS — I959 Hypotension, unspecified: Secondary | ICD-10-CM | POA: Diagnosis present

## 2022-11-11 DIAGNOSIS — M545 Low back pain, unspecified: Secondary | ICD-10-CM

## 2022-11-11 DIAGNOSIS — Z794 Long term (current) use of insulin: Secondary | ICD-10-CM | POA: Diagnosis not present

## 2022-11-11 DIAGNOSIS — D649 Anemia, unspecified: Secondary | ICD-10-CM | POA: Diagnosis not present

## 2022-11-11 DIAGNOSIS — C7951 Secondary malignant neoplasm of bone: Secondary | ICD-10-CM | POA: Diagnosis present

## 2022-11-11 DIAGNOSIS — E11649 Type 2 diabetes mellitus with hypoglycemia without coma: Secondary | ICD-10-CM | POA: Diagnosis not present

## 2022-11-11 DIAGNOSIS — T451X5A Adverse effect of antineoplastic and immunosuppressive drugs, initial encounter: Secondary | ICD-10-CM | POA: Diagnosis present

## 2022-11-11 DIAGNOSIS — D619 Aplastic anemia, unspecified: Secondary | ICD-10-CM | POA: Diagnosis present

## 2022-11-11 DIAGNOSIS — M8458XA Pathological fracture in neoplastic disease, other specified site, initial encounter for fracture: Secondary | ICD-10-CM | POA: Diagnosis present

## 2022-11-11 DIAGNOSIS — Z978 Presence of other specified devices: Secondary | ICD-10-CM | POA: Insufficient documentation

## 2022-11-11 DIAGNOSIS — T83511A Infection and inflammatory reaction due to indwelling urethral catheter, initial encounter: Secondary | ICD-10-CM | POA: Diagnosis present

## 2022-11-11 DIAGNOSIS — Z1152 Encounter for screening for COVID-19: Secondary | ICD-10-CM | POA: Diagnosis not present

## 2022-11-11 DIAGNOSIS — S32020A Wedge compression fracture of second lumbar vertebra, initial encounter for closed fracture: Secondary | ICD-10-CM | POA: Insufficient documentation

## 2022-11-11 DIAGNOSIS — D631 Anemia in chronic kidney disease: Secondary | ICD-10-CM | POA: Diagnosis present

## 2022-11-11 DIAGNOSIS — C61 Malignant neoplasm of prostate: Secondary | ICD-10-CM | POA: Diagnosis not present

## 2022-11-11 DIAGNOSIS — R000 Tachycardia, unspecified: Secondary | ICD-10-CM | POA: Diagnosis present

## 2022-11-11 DIAGNOSIS — Y846 Urinary catheterization as the cause of abnormal reaction of the patient, or of later complication, without mention of misadventure at the time of the procedure: Secondary | ICD-10-CM | POA: Diagnosis present

## 2022-11-11 DIAGNOSIS — K5900 Constipation, unspecified: Secondary | ICD-10-CM | POA: Diagnosis present

## 2022-11-11 DIAGNOSIS — D6481 Anemia due to antineoplastic chemotherapy: Secondary | ICD-10-CM | POA: Diagnosis present

## 2022-11-11 DIAGNOSIS — I700 Atherosclerosis of aorta: Secondary | ICD-10-CM | POA: Diagnosis present

## 2022-11-11 DIAGNOSIS — Z96652 Presence of left artificial knee joint: Secondary | ICD-10-CM | POA: Diagnosis present

## 2022-11-11 DIAGNOSIS — N390 Urinary tract infection, site not specified: Secondary | ICD-10-CM | POA: Diagnosis not present

## 2022-11-11 DIAGNOSIS — G620 Drug-induced polyneuropathy: Secondary | ICD-10-CM | POA: Diagnosis present

## 2022-11-11 DIAGNOSIS — I129 Hypertensive chronic kidney disease with stage 1 through stage 4 chronic kidney disease, or unspecified chronic kidney disease: Secondary | ICD-10-CM | POA: Diagnosis present

## 2022-11-11 DIAGNOSIS — E1165 Type 2 diabetes mellitus with hyperglycemia: Secondary | ICD-10-CM | POA: Diagnosis present

## 2022-11-11 DIAGNOSIS — N12 Tubulo-interstitial nephritis, not specified as acute or chronic: Secondary | ICD-10-CM | POA: Diagnosis present

## 2022-11-11 DIAGNOSIS — E875 Hyperkalemia: Secondary | ICD-10-CM | POA: Diagnosis present

## 2022-11-11 DIAGNOSIS — E1122 Type 2 diabetes mellitus with diabetic chronic kidney disease: Secondary | ICD-10-CM | POA: Diagnosis present

## 2022-11-11 LAB — GLUCOSE, CAPILLARY
Glucose-Capillary: 131 mg/dL — ABNORMAL HIGH (ref 70–99)
Glucose-Capillary: 148 mg/dL — ABNORMAL HIGH (ref 70–99)
Glucose-Capillary: 163 mg/dL — ABNORMAL HIGH (ref 70–99)
Glucose-Capillary: 165 mg/dL — ABNORMAL HIGH (ref 70–99)
Glucose-Capillary: 173 mg/dL — ABNORMAL HIGH (ref 70–99)
Glucose-Capillary: 198 mg/dL — ABNORMAL HIGH (ref 70–99)
Glucose-Capillary: 224 mg/dL — ABNORMAL HIGH (ref 70–99)
Glucose-Capillary: 86 mg/dL (ref 70–99)

## 2022-11-11 LAB — HEMOGLOBIN AND HEMATOCRIT, BLOOD
HCT: 21.5 % — ABNORMAL LOW (ref 39.0–52.0)
HCT: 20.6 % — ABNORMAL LOW (ref 39.0–52.0)
Hemoglobin: 7 g/dL — ABNORMAL LOW (ref 13.0–17.0)
Hemoglobin: 6.5 g/dL — ABNORMAL LOW (ref 13.0–17.0)

## 2022-11-11 LAB — URINALYSIS, W/ REFLEX TO CULTURE (INFECTION SUSPECTED)
Bilirubin Urine: NEGATIVE
Glucose, UA: >=500 mg/dL — AB
Hgb urine dipstick: NEGATIVE
Ketones, ur: NEGATIVE mg/dL
Nitrite: NEGATIVE
Protein, ur: NEGATIVE mg/dL
Specific Gravity, Urine: 1.025 (ref 1.005–1.030)
Squamous Epithelial / HPF: NONE SEEN /HPF (ref 0–5)
WBC, UA: >50 WBC/hpf (ref 0–5)
pH: 5 (ref 5.0–8.0)

## 2022-11-11 LAB — TYPE AND SCREEN
Antibody Screen: NEGATIVE
Unit division: 0

## 2022-11-11 LAB — HEMOGLOBIN: Hemoglobin: 5.5 g/dL — ABNORMAL LOW (ref 13.0–17.0)

## 2022-11-11 LAB — PREPARE RBC (CROSSMATCH)

## 2022-11-11 MED ORDER — OXYCODONE HCL 5 MG PO TABS
5.0000 mg | ORAL_TABLET | Freq: Three times a day (TID) | ORAL | Status: DC | PRN
  Administered 2022-11-11 – 2022-11-16 (×3): 5 mg via ORAL
  Filled 2022-11-11 (×3): qty 1

## 2022-11-11 MED ORDER — ONDANSETRON HCL 4 MG/2ML IJ SOLN: 4.0000 mg | Freq: Four times a day (QID) | INTRAMUSCULAR | Status: DC | PRN

## 2022-11-11 MED ORDER — LIDOCAINE 5 % EX PTCH
1.0000 | MEDICATED_PATCH | CUTANEOUS | Status: DC
  Administered 2022-11-11 – 2022-11-16 (×6): 1 via TRANSDERMAL
  Filled 2022-11-11 (×6): qty 1

## 2022-11-11 MED ORDER — INSULIN ASPART 100 UNIT/ML IJ SOLN
0.0000 [IU] | INTRAMUSCULAR | Status: DC
  Administered 2022-11-11: 2 [IU] via SUBCUTANEOUS
  Administered 2022-11-11: 5 [IU] via SUBCUTANEOUS
  Administered 2022-11-11: 3 [IU] via SUBCUTANEOUS
  Administered 2022-11-11: 2 [IU] via SUBCUTANEOUS
  Administered 2022-11-12 (×2): 3 [IU] via SUBCUTANEOUS
  Administered 2022-11-12 (×2): 2 [IU] via SUBCUTANEOUS
  Filled 2022-11-11 (×8): qty 1

## 2022-11-11 MED ORDER — ACETAMINOPHEN 325 MG PO TABS
650.0000 mg | ORAL_TABLET | Freq: Four times a day (QID) | ORAL | Status: DC | PRN
  Administered 2022-11-12 – 2022-11-13 (×4): 650 mg via ORAL
  Filled 2022-11-11 (×4): qty 2

## 2022-11-11 MED ORDER — ONDANSETRON HCL 4 MG PO TABS: 4.0000 mg | ORAL_TABLET | Freq: Four times a day (QID) | ORAL | Status: DC | PRN

## 2022-11-11 MED ORDER — CHLORHEXIDINE GLUCONATE CLOTH 2 % EX PADS
6.0000 | MEDICATED_PAD | Freq: Every day | CUTANEOUS | Status: DC
  Administered 2022-11-12 – 2022-11-16 (×4): 6 via TOPICAL

## 2022-11-11 MED ORDER — MORPHINE SULFATE (PF) 2 MG/ML IV SOLN
2.0000 mg | INTRAVENOUS | Status: DC | PRN
  Administered 2022-11-11: 2 mg via INTRAVENOUS
  Filled 2022-11-11: qty 1

## 2022-11-11 MED ORDER — ACETAMINOPHEN 650 MG RE SUPP: 650.0000 mg | Freq: Four times a day (QID) | RECTAL | Status: DC | PRN

## 2022-11-11 MED ORDER — GABAPENTIN 100 MG PO CAPS
200.0000 mg | ORAL_CAPSULE | Freq: Two times a day (BID) | ORAL | Status: DC
  Administered 2022-11-11 – 2022-11-16 (×12): 200 mg via ORAL
  Filled 2022-11-11 (×13): qty 2

## 2022-11-11 MED ORDER — INSULIN GLARGINE-YFGN 100 UNIT/ML ~~LOC~~ SOLN
30.0000 [IU] | Freq: Every day | SUBCUTANEOUS | Status: DC
  Administered 2022-11-11: 30 [IU] via SUBCUTANEOUS
  Filled 2022-11-11 (×3): qty 0.3

## 2022-11-11 NOTE — Assessment & Plan Note (Addendum)
Hypotension History of chemotherapy-induced normocytic anemia Patient with hemoglobin 5.6, down from 7.5 a couple weeks prior and mildly hypotensive History of transfusion of 4 units PRBCs for chemotherapy-induced anemia during hospitalization in May and currently receives Retacrit with oncology No reports of black or bloody stool, hematuria.  No history of GI endoluminal procedure on record review Transfuse 2 units PRBCs with serial H&H thereafter Close hemodynamic monitoring in PCU Consider oncology consult in the a.m.  Can consider GI consult

## 2022-11-11 NOTE — Assessment & Plan Note (Signed)
Possible SIRS Essential hypertension Patient hypotensive and tachycardic but no other sepsis criteria Received IV bolus with EMS and now receiving blood Continue to monitor Hold antihypertensives

## 2022-11-11 NOTE — Progress Notes (Addendum)
[facility] Palliative Referral Note  Asked by Kemper Durie, RN to speak with patient and his wife about palliative services.  Met at bedside with Mr. & Mrs. Brownridge to discuss palliative services offered by [employer].   Mr. Laffin slept through the consult.  Mrs. Palin was appreciative of the visit and will likely want to proceed with palliative care at discharge.  She wanted to speak with her son and other family members first.   I notified her I would go ahead and send the referral in and when someone calls, if she didn't want to proceed with admission to program, she could decline.  She was in agreement with this process.  Thank you for allowing participation in this patient's care.  Norris Cross, RN Clinical Nurse Liaison [phone]

## 2022-11-11 NOTE — Progress Notes (Addendum)
PROGRESS NOTE    Keith Howe  WUJ:[id number] DOB: 10-23-1940 DOA: 11/10/2022 PCP: Corky Downs, MD   Assessment & Plan:   Principal Problem:   Symptomatic anemia Active Problems:   Normocytic anemia   Antineoplastic chemotherapy induced anemia   SIRS (systemic inflammatory response syndrome) (HCC)   Hypotension   Prostate cancer metastatic to bone (HCC)   Acute exacerbation of chronic low back pain   Uncontrolled type 2 diabetes mellitus with hyperglycemia, with long-term current use of insulin (HCC)   Chronic kidney disease, stage 3a (HCC)   Chemotherapy-induced neuropathy (HCC)   Compression fracture of L2 lumbar vertebra, closed, initial encounter (HCC)   Indwelling Foley catheter present  Assessment and Plan: Symptomatic anemia: likely secondary to chemo. S/p 1 unit of pRBCs transfused. Hb 7.0, will likely need another unit of pRBCs. Hx of transfusion of 4 units PRBCs for chemotherapy-induced anemia during hospitalization in May and currently receives Retacrit with oncology.     Hypotension: resolved  HTN: holding home dose of metoprolol   Acute L2 mild compression fracture: likely pathological secondary to metastatic prostate cancer. Oxycodone, morphine prn for pain  Possible UTI: likely secondary to chronic foley. Urine cx is pending. Last seen by urology on 6/7 with the following plan "Tentatively consider HOLEP in 4 to 5 weeks if clinically stable and able to tolerate surgery... pain medications and a palliative care evaluation". Has another outpatient appt w/ urology on 11/16/22 as per pt's wife    DM2: well controlled, HbA1c 6.8 in 07/2022. Continue on glargine, SSI w/ accuchecks    CKDIIIa: Cr  is labile. Avoid nephrotoxic meds    Chemotherapy-induced neuropathy: continue on home dose of gabapentin       DVT prophylaxis: SCDs Code Status: full  Family Communication: discussed pt's care w/ pt's wife at bedside and answered her questions  Disposition Plan:  likely d/c back home  Level of care: Progressive Status is: Inpatient Remains inpatient appropriate because: severity of illness     Consultants:  Palliative care   Procedures:   Antimicrobials:    Subjective: Pt c/o fatigue   Objective: Vitals:   11/11/22 0241 11/11/22 0401 11/11/22 0520 11/11/22 0852  BP: 131/68 138/71 139/78 135/81  Pulse: (!) 104 (!) 103 83 (!) 101  Resp: 20 20 20 20   Temp: (!) 97.5 F (36.4 C) 97.7 F (36.5 C) 97.8 F (36.6 C) 97.6 F (36.4 C)  TempSrc: Oral Oral Oral   SpO2: 98% 100% 99% 97%  Weight:      Height:        Intake/Output Summary (Last 24 hours) at 11/11/2022 0922 Last data filed at 11/11/2022 0521 Gross per 24 hour  Intake 358 ml  Output 400 ml  Net -42 ml   Filed Weights   11/10/22 2148 11/11/22 0155  Weight: 101 kg 101.7 kg    Examination:  General exam: Appears calm and comfortable  Respiratory system: Clear to auscultation. Respiratory effort normal. Cardiovascular system: S1 & S2+. No  rubs, gallops or clicks.  Gastrointestinal system: Abdomen is nondistended, soft and nontender. Normal bowel sounds heard. Central nervous system: Alert and awake. Moves all extremities  Psychiatry: Judgement and insight appears at baseline. Flat mood and affect   Data Reviewed: I have personally reviewed following labs and imaging studies  CBC: Recent Labs  Lab 11/10/22 2208 11/11/22 0214 11/11/22 0734  WBC 10.2  --   --   HGB 5.6* 5.5* 7.0*  HCT 18.3*  --  21.5*  MCV 87.6  --   --   PLT 169  --   --    Basic Metabolic Panel: Recent Labs  Lab 11/10/22 2208  NA 134*  K 4.9  CL 101  CO2 22  GLUCOSE 206*  BUN 40*  CREATININE 1.27*  CALCIUM 8.9   GFR: Estimated Creatinine Clearance: 58.1 mL/min (A) (by C-G formula based on SCr of 1.27 mg/dL (H)). Liver Function Tests: No results for input(s): "AST", "ALT", "ALKPHOS", "BILITOT", "PROT", "ALBUMIN" in the last 168 hours. No results for input(s): "LIPASE", "AMYLASE"  in the last 168 hours. No results for input(s): "AMMONIA" in the last 168 hours. Coagulation Profile: No results for input(s): "INR", "PROTIME" in the last 168 hours. Cardiac Enzymes: No results for input(s): "CKTOTAL", "CKMB", "CKMBINDEX", "TROPONINI" in the last 168 hours. BNP (last 3 results) No results for input(s): "PROBNP" in the last 8760 hours. HbA1C: No results for input(s): "HGBA1C" in the last 72 hours. CBG: Recent Labs  Lab 11/10/22 2205 11/11/22 0201 11/11/22 0358 11/11/22 0851  GLUCAP 174* 165* 148* 131*   Lipid Profile: No results for input(s): "CHOL", "HDL", "LDLCALC", "TRIG", "CHOLHDL", "LDLDIRECT" in the last 72 hours. Thyroid Function Tests: No results for input(s): "TSH", "T4TOTAL", "FREET4", "T3FREE", "THYROIDAB" in the last 72 hours. Anemia Panel: No results for input(s): "VITAMINB12", "FOLATE", "FERRITIN", "TIBC", "IRON", "RETICCTPCT" in the last 72 hours. Sepsis Labs: No results for input(s): "PROCALCITON", "LATICACIDVEN" in the last 168 hours.  No results found for this or any previous visit (from the past 240 hour(s)).       Radiology Studies: CT ABDOMEN PELVIS W CONTRAST  Result Date: 11/11/2022 CLINICAL DATA:  Acute abdominal pain.  Back pain.  Prostate cancer. EXAM: CT ABDOMEN AND PELVIS WITH CONTRAST TECHNIQUE: Multidetector CT imaging of the abdomen and pelvis was performed using the standard protocol following bolus administration of intravenous contrast. RADIATION DOSE REDUCTION: This exam was performed according to the departmental dose-optimization program which includes automated exposure control, adjustment of the mA and/or kV according to patient size and/or use of iterative reconstruction technique. CONTRAST:  OMNIPAQUE IOHEXOL 300 MG/ML  SOLN COMPARISON:  CT renal stone 10/13/2022.  PET-CT 10/09/2022. FINDINGS: Lower chest: There is a trace right pleural effusion. Hepatobiliary: No focal liver abnormality is seen. No gallstones,  gallbladder wall thickening, or biliary dilatation. Pancreas: Unremarkable. No pancreatic ductal dilatation or surrounding inflammatory changes. Spleen: Normal in size without focal abnormality. Adrenals/Urinary Tract: Foley catheter is seen within bladder. There is marked diffuse bladder wall thickening with mild surrounding inflammation. Small amount of air in the bladder is likely related to Foley catheter placement. There is no hydronephrosis or perinephric fluid. There is some ill-defined patchy areas of hypoattenuation in the superior pole the right kidney which may represent pyelonephritis. There is no urinary tract calculus. The adrenal glands are within normal limits. Stomach/Bowel: Stomach is within normal limits. Appendix appears normal. No evidence of bowel wall thickening, distention, or inflammatory changes. Vascular/Lymphatic: Aortic atherosclerosis. No enlarged abdominal or pelvic lymph nodes. Reproductive: Prostate gland is enlarged. Prostate radiotherapy seeds are present. Other: There is a small fat containing right inguinal hernia. There is a small umbilical hernia containing nondilated bowel. There is no ascites. Small subcutaneous nodules in the anterior right abdominal wall are unchanged. Musculoskeletal: There is mild new compression deformity of the superior endplate of L2. Mild compression deformity of the superior endplate of L1 appears stable. Degenerative changes affect the spine and hips. IMPRESSION: 1. Marked diffuse bladder wall thickening with  surrounding inflammation worrisome for cystitis. 2. Patchy areas of hypoattenuation in the superior pole the right kidney may represent pyelonephritis. 3. New acute mild compression deformity of the superior endplate of L2. 4. Trace right pleural effusion. Aortic Atherosclerosis (ICD10-I70.0). Electronically Signed   By: Darliss Cheney M.D.   On: 11/11/2022 00:02        Scheduled Meds:  gabapentin  200 mg Oral BID   insulin aspart  0-15  Units Subcutaneous Q4H   insulin glargine-yfgn  30 Units Subcutaneous Q2200   lidocaine  1 patch Transdermal Q24H   Continuous Infusions:   LOS: 0 days    Time spent: 35 mins     Charise Killian, MD [facility] Pager [phone]  If 7PM-7AM, please contact night-coverage [email or web address] 11/11/2022, 9:22 AM

## 2022-11-11 NOTE — Assessment & Plan Note (Signed)
Renal function at baseline 

## 2022-11-11 NOTE — TOC Initial Note (Signed)
Transition of Care [facility]) - Initial/Assessment Note    Patient Details  Name: Keith Howe MRN: [id number] Date of Birth: Aug 12, 1940  [facility] CM/SW Contact:    Kemper Durie, RN Phone Number: 11/11/2022, 1:08 PM  Clinical Narrative:                  Patient admitted from home with wife, [name].  Per wife, patient was active with [facility] prior to admission, would like to stay with them at discharge.  Contacted [facility] to confirm patient would like to remain active.  Dr. Juel Burrow is PCP, [facility] [address] [facility] [facility].  Has walker and wheelchair at home.  Wife state palliative care was discussed, she is open to conversation, does not have preference of agency.  Diannia Ruder with [facility] notified, will see patient and wife at bedside.  Will continue to follow up for TOC needs.   Expected Discharge Plan: Home w Home Health Services Barriers to Discharge: Continued Medical Work up   Patient Goals and CMS Choice            Expected Discharge Plan and Services In-house Referral: Hospice / Palliative Care   Post Acute Care Choice: Home Health Living arrangements for the past 2 months: Single Family Home                                      Prior Living Arrangements/Services Living arrangements for the past 2 months: Single Family Home Lives with:: Spouse              Current home services: DME, Home PT    Activities of Daily Living Home Assistive Devices/Equipment: Cane (specify quad or straight), Wheelchair, Hearing aid, Environmental consultant (specify type) ADL Screening (condition at time of admission) Patient's cognitive ability adequate to safely complete daily activities?: Yes Is the patient deaf or have difficulty hearing?: No Does the patient have difficulty seeing, even when wearing glasses/contacts?: No Does the patient have difficulty concentrating, remembering, or making decisions?: No Patient able to express need for assistance  with ADLs?: Yes Does the patient have difficulty dressing or bathing?: No Independently performs ADLs?: Yes (appropriate for developmental age) Does the patient have difficulty walking or climbing stairs?: No Weakness of Legs: None Weakness of Arms/Hands: None  Permission Sought/Granted                  Emotional Assessment              Admission diagnosis:  Symptomatic anemia [D64.9] Acute midline low back pain without sciatica [M54.50] Patient Active Problem List   Diagnosis Date Noted   Symptomatic anemia 11/11/2022   Acute exacerbation of chronic low back pain 11/11/2022   Antineoplastic chemotherapy induced anemia 11/11/2022   Compression fracture of L2 lumbar vertebra, closed, initial encounter (HCC) 11/11/2022   Indwelling Foley catheter present 11/11/2022   Hypotension 09/27/2022   Anemia in stage 3 chronic kidney disease (HCC) 09/21/2022   Chronic kidney disease, stage 3b (HCC) 09/21/2022   Leukocytosis 09/21/2022   Bilateral hydronephrosis 09/20/2022   Chemotherapy-induced neuropathy (HCC) 09/13/2022   Weight loss 08/09/2022   Syncope 07/31/2022   SIRS (systemic inflammatory response syndrome) (HCC) 07/31/2022   Obesity (BMI 30-39.9) 07/31/2022   Uncontrolled type 2 diabetes mellitus with hyperglycemia, with long-term current use of insulin (HCC) 07/31/2022   Myocardial injury 07/31/2022   Bilateral  leg edema 07/31/2022   Normocytic anemia 07/31/2022   Chemotherapy induced neutropenia (HCC) 07/05/2022   Encounter for antineoplastic chemotherapy 05/26/2022   Prostate cancer metastatic to bone (HCC) 07/13/2021   Goals of care, counseling/discussion 07/13/2021   Injury of quadriceps muscle 08/26/2020   Chronic kidney disease, stage 3a (HCC) 05/27/2020   Annual physical exam 02/18/2020   Need for influenza vaccination 02/18/2020   Essential hypertension 12/15/2019   Class 1 obesity with serious comorbidity and body mass index (BMI) of 31.0 to 31.9 in adult  10/13/2019   Type 2 diabetes mellitus without complication, with long-term current use of insulin (HCC) 10/13/2019   S/P TKR (total knee replacement) using cement, left 03/19/2018   PCP:  Corky Downs, MD Pharmacy:   [facility] [address], [place] - [address] [place] [address] Phone: [phone] Fax: [phone]  [facility] - [place], [place] - [address] [place] [address] Phone: [phone] Fax: [phone]  [facility], [place] - [address] [address] [place] [address] Phone: [phone] Fax: [phone]  [id number] [place], [place] - [address] [address] Phone: [phone] Fax: [phone]     Social Determinants of Health (SDOH) Social History: SDOH Screenings   Food Insecurity: No Food Insecurity (11/11/2022)  Housing: Low Risk  (11/11/2022)  Transportation Needs: No Transportation Needs (11/11/2022)  Utilities: Not At Risk (11/11/2022)  Alcohol Screen: Low Risk  (05/12/2021)  Depression (PHQ2-9): Low Risk  (08/29/2021)  Financial Resource Strain: Low Risk  (05/12/2021)  Physical Activity: Insufficiently Active (05/12/2021)  Social Connections: Socially Integrated (05/12/2021)  Stress: No Stress Concern Present (05/12/2021)  Tobacco Use: Low Risk  (11/10/2022)   SDOH Interventions:     Readmission Risk Interventions    09/25/2022   12:18 PM  Readmission Risk Prevention Plan  Transportation Screening Complete  PCP or Specialist Appt within 3-5 Days Complete  HRI or Home Care Consult Complete  Social Work Consult for Recovery Care Planning/Counseling Complete  Palliative Care Screening Complete  Medication Review Oceanographer) Complete

## 2022-11-11 NOTE — H&P (Signed)
History and Physical    Patient: Keith Howe DOB: 07/29/1940 DOA: 11/10/2022 DOS: the patient was seen and examined on 11/11/2022 PCP: Corky Downs, MD  Patient coming from: Home  Chief Complaint:  Chief Complaint  Patient presents with   Hyperglycemia    HPI: Keith Howe is a 82 y.o. male with medical history significant for DM, HTN, prostate cancer with extensive skeletal metastases s/p RT and on Eligard, currently with indwelling Foley, CKD 3A,  chemotherapy-induced neuropathy, and chronic back pain, with history of hospitalization from 5/16 to 09/26/2022 for symptomatic anemia attributed to chemotherapy, receiving 4 units PRBCs, and who continues on outpatient Retacrit, last seen by his oncologist, Dr. Cathie Hoops on 6/27, who presents to the ED with persistent back pain not responding to outpatient Aleve and Tylenol.  He denies new bowel or bladder dysfunction and denies numbness in the groin and inner thigh area.  Denies weakness in the legs..  He spoke to the the oncology office earlier in the day and they called in a prescription for oxycodone however EMS was called due to unrelieved pain.  Patient was hypotensive with EMS and with a blood sugar of 386.  He received a 500 mL LR bolus and route. ED course and data review: Mildly tachycardic to 110 with soft BP of 103/53 and otherwise normal vitals. Labs most notable for hemoglobin of 5, down from 7.2 about 10 days prior.  Creatinine at baseline at 1.27, glucose elevated at 206.  Urinalysis showed large leukocyte esterase and rare bacteria. EKG, personally viewed and interpreted showing sinus tachycardia with no acute ST-T wave changes. CT abdomen and pelvis with contrast pending Patient given an NS bolus, morphine and Zofran. Hospitalist consulted for admission.    CT abdomen and pelvis resulted after admission showed the following IMPRESSION: 1. Marked diffuse bladder wall thickening with surrounding inflammation  worrisome for cystitis. 2. Patchy areas of hypoattenuation in the superior pole the right kidney may represent pyelonephritis. 3. New acute mild compression deformity of the superior endplate of L2. 4. Trace right pleural effusion.  Review of Systems  Gastrointestinal:  Negative for abdominal pain, blood in stool, diarrhea, melena, nausea and vomiting.  Genitourinary:  Negative for hematuria.     Past Medical History:  Diagnosis Date   BPH (benign prostatic hyperplasia)    Cancer (HCC)    Chronic kidney disease    STAGE 3   Diabetes mellitus without complication (HCC)    HOH (hard of hearing)    AIDS   Hypertension    Inguinal hernia    Pain    CHRONIC LBP   Prostate cancer [facility])    Past Surgical History:  Procedure Laterality Date   BACK SURGERY     Lumbar   CATARACT EXTRACTION W/PHACO Right 07/10/2017   Procedure: CATARACT EXTRACTION PHACO AND INTRAOCULAR LENS PLACEMENT (IOC);  Surgeon: Galen Manila, MD;  Location: [facility] ORS;  Service: Ophthalmology;  Laterality: Right;  [place] 00:29.2 AP% 16.5 CDE 4.83 Fluid Pack Lot # [id number]   COLONOSCOPY     EYE SURGERY Bilateral    cataract extraction   HERNIA REPAIR     INGUINAL   PROSTATE BIOPSY N/A 04/08/2020   Procedure: PROSTATE BIOPSY Addison Bailey;  Surgeon: Orson Ape, MD;  Location: [facility] ORS;  Service: Urology;  Laterality: N/A;   TOTAL KNEE ARTHROPLASTY Left 03/19/2018   Procedure: TOTAL KNEE ARTHROPLASTY;  Surgeon: Juanell Fairly, MD;  Location: [facility] ORS;  Service: Orthopedics;  Laterality: Left;   Social  History:  reports that he has never smoked. He has never been exposed to tobacco smoke. He has never used smokeless tobacco. He reports that he does not drink alcohol and does not use drugs.  No Known Allergies  Family History  Problem Relation Age of Onset   Diabetes Father    Breast cancer Sister     Prior to Admission medications   Medication Sig Start Date End Date Taking? Authorizing Provider  aspirin  EC 81 MG tablet Take 81 mg by mouth daily.    [provider]  cholecalciferol (VITAMIN D) 1000 units tablet Take 1,000 Units by mouth daily.    [provider]  dapagliflozin propanediol (FARXIGA) 10 MG TABS tablet Take 10 mg by mouth daily.    [provider]  Iron-Vitamin C 65-125 MG TABS Take 1 tablet by mouth daily. 08/23/22   Rickard Patience, MD  loratadine (CLARITIN) 10 MG tablet Take 10 mg by mouth once daily before chemo and at least 2 days after injection.    [provider]  losartan (COZAAR) 100 MG tablet Take 100 mg by mouth daily.    [provider]  lovastatin (MEVACOR) 40 MG tablet TAKE 1 TABLET BY MOUTH EVERY DAY 04/25/22   Corky Downs, MD  metoprolol tartrate (LOPRESSOR) 50 MG tablet TAKE 1 TABLET BY MOUTH EVERY 12 HOURS 11/25/21   Corky Downs, MD  Misc Natural Products (PROSTATE SUPPORT PO) Take 2 capsules by mouth daily.    [provider]  NOVOLOG FLEXPEN 100 UNIT/ML FlexPen Inject into the skin. Per sliding scale 01/03/18   [provider]  Omega-3 Fatty Acids (FISH OIL PO) Take 1 capsule by mouth daily.     [provider]  oxyCODONE (OXY IR/ROXICODONE) 5 MG immediate release tablet Take 1 tablet (5 mg total) by mouth every 8 (eight) hours as needed for up to 10 days for severe pain. 11/10/22 11/20/22  Creig Hines, MD  TRESIBA FLEXTOUCH 200 UNIT/ML SOPN Inject 80 Units into the muscle daily before breakfast.  06/20/17   [provider]  TRULICITY 1.5 MG/0.5ML SOPN Inject into the skin. 08/10/22   [provider]  vitamin B-12 (CYANOCOBALAMIN) 500 MCG tablet Take 500 mcg by mouth daily.    [provider]    Physical Exam: Vitals:   11/10/22 2148 11/10/22 2150 11/10/22 2230  BP:  (!) 109/52 (!) 103/53  Pulse:  (!) 110 100  Resp:  20 16  Temp:  98.9 F (37.2 C)   TempSrc:  Oral   SpO2:  100% 100%  Weight: 101 kg    Height: 6\' 1"  (1.854 m)     Physical Exam Vitals and nursing  note reviewed.  Constitutional:      General: He is not in acute distress. HENT:     Head: Normocephalic and atraumatic.  Cardiovascular:     Rate and Rhythm: Regular rhythm. Tachycardia present.     Heart sounds: Normal heart sounds.  Pulmonary:     Effort: Pulmonary effort is normal.     Breath sounds: Normal breath sounds.  Abdominal:     Palpations: Abdomen is soft.     Tenderness: There is no abdominal tenderness.  Musculoskeletal:     Right lower leg: Edema present.     Left lower leg: Edema present.  Neurological:     Mental Status: Mental status is at baseline.     Labs on Admission: I have personally reviewed following labs and imaging studies  CBC: Recent Labs  Lab 11/10/22 2208  WBC 10.2  HGB 5.6*  HCT 18.3*  MCV 87.6  PLT 169   Basic Metabolic Panel: Recent Labs  Lab 11/10/22 2208  NA 134*  K 4.9  CL 101  CO2 22  GLUCOSE 206*  BUN 40*  CREATININE 1.27*  CALCIUM 8.9   GFR: Estimated Creatinine Clearance: 57 mL/min (A) (by C-G formula based on SCr of 1.27 mg/dL (H)). Liver Function Tests: No results for input(s): "AST", "ALT", "ALKPHOS", "BILITOT", "PROT", "ALBUMIN" in the last 168 hours. No results for input(s): "LIPASE", "AMYLASE" in the last 168 hours. No results for input(s): "AMMONIA" in the last 168 hours. Coagulation Profile: No results for input(s): "INR", "PROTIME" in the last 168 hours. Cardiac Enzymes: No results for input(s): "CKTOTAL", "CKMB", "CKMBINDEX", "TROPONINI" in the last 168 hours. BNP (last 3 results) No results for input(s): "PROBNP" in the last 8760 hours. HbA1C: No results for input(s): "HGBA1C" in the last 72 hours. CBG: Recent Labs  Lab 11/10/22 2205  GLUCAP 174*   Lipid Profile: No results for input(s): "CHOL", "HDL", "LDLCALC", "TRIG", "CHOLHDL", "LDLDIRECT" in the last 72 hours. Thyroid Function Tests: No results for input(s): "TSH", "T4TOTAL", "FREET4", "T3FREE", "THYROIDAB" in the last 72 hours. Anemia  Panel: No results for input(s): "VITAMINB12", "FOLATE", "FERRITIN", "TIBC", "IRON", "RETICCTPCT" in the last 72 hours. Urine analysis:    Component Value Date/Time   COLORURINE AMBER (A) 10/13/2022 2138   APPEARANCEUR CLOUDY (A) 10/13/2022 2138   LABSPEC 1.014 10/13/2022 2138   PHURINE 7.0 10/13/2022 2138   GLUCOSEU >=500 (A) 10/13/2022 2138   HGBUR LARGE (A) 10/13/2022 2138   BILIRUBINUR NEGATIVE 10/13/2022 2138   KETONESUR NEGATIVE 10/13/2022 2138   PROTEINUR 100 (A) 10/13/2022 2138   NITRITE NEGATIVE 10/13/2022 2138   LEUKOCYTESUR TRACE (A) 10/13/2022 2138    Radiological Exams on Admission: CT ABDOMEN PELVIS W CONTRAST  Result Date: 11/11/2022 CLINICAL DATA:  Acute abdominal pain.  Back pain.  Prostate cancer. EXAM: CT ABDOMEN AND PELVIS WITH CONTRAST TECHNIQUE: Multidetector CT imaging of the abdomen and pelvis was performed using the standard protocol following bolus administration of intravenous contrast. RADIATION DOSE REDUCTION: This exam was performed according to the departmental dose-optimization program which includes automated exposure control, adjustment of the mA and/or kV according to patient size and/or use of iterative reconstruction technique. CONTRAST:  OMNIPAQUE IOHEXOL 300 MG/ML  SOLN COMPARISON:  CT renal stone 10/13/2022.  PET-CT 10/09/2022. FINDINGS: Lower chest: There is a trace right pleural effusion. Hepatobiliary: No focal liver abnormality is seen. No gallstones, gallbladder wall thickening, or biliary dilatation. Pancreas: Unremarkable. No pancreatic ductal dilatation or surrounding inflammatory changes. Spleen: Normal in size without focal abnormality. Adrenals/Urinary Tract: Foley catheter is seen within bladder. There is marked diffuse bladder wall thickening with mild surrounding inflammation. Small amount of air in the bladder is likely related to Foley catheter placement. There is no hydronephrosis or perinephric fluid. There is some ill-defined patchy  areas of hypoattenuation in the superior pole the right kidney which may represent pyelonephritis. There is no urinary tract calculus. The adrenal glands are within normal limits. Stomach/Bowel: Stomach is within normal limits. Appendix appears normal. No evidence of bowel wall thickening, distention, or inflammatory changes. Vascular/Lymphatic: Aortic atherosclerosis. No enlarged abdominal or pelvic lymph nodes. Reproductive: Prostate gland is enlarged. Prostate radiotherapy seeds are present. Other: There is a small fat containing right inguinal hernia. There is a small umbilical hernia containing nondilated bowel. There is no ascites. Small subcutaneous nodules  in the anterior right abdominal wall are unchanged. Musculoskeletal: There is mild new compression deformity of the superior endplate of L2. Mild compression deformity of the superior endplate of L1 appears stable. Degenerative changes affect the spine and hips. IMPRESSION: 1. Marked diffuse bladder wall thickening with surrounding inflammation worrisome for cystitis. 2. Patchy areas of hypoattenuation in the superior pole the right kidney may represent pyelonephritis. 3. New acute mild compression deformity of the superior endplate of L2. 4. Trace right pleural effusion. Aortic Atherosclerosis (ICD10-I70.0). Electronically Signed   By: Darliss Cheney M.D.   On: 11/11/2022 00:02     Data Reviewed: Relevant notes from primary care and specialist visits, past discharge summaries as available in EHR, including Care Everywhere. Prior diagnostic testing as pertinent to current admission diagnoses Updated medications and problem lists for reconciliation ED course, including vitals, labs, imaging, treatment and response to treatment Triage notes, nursing and pharmacy notes and ED provider's notes Notable results as noted in HPI   Assessment and Plan: * Symptomatic anemia Hypotension History of chemotherapy-induced normocytic anemia Patient with  hemoglobin 5.6, down from 7.5 a couple weeks prior and mildly hypotensive History of transfusion of 4 units PRBCs for chemotherapy-induced anemia during hospitalization in May and currently receives Retacrit with oncology No reports of black or bloody stool, hematuria.  No history of GI endoluminal procedure on record review Transfuse 2 units PRBCs with serial H&H thereafter Close hemodynamic monitoring in PCU Consider oncology consult in the a.m.  Can consider GI consult   Hypotension Possible SIRS Essential hypertension Patient hypotensive and tachycardic but no other sepsis criteria Received IV bolus with EMS and now receiving blood Continue to monitor Hold antihypertensives  Acute exacerbation of chronic low back pain Acute L2 mild compression fracture Prostate cancer metastatic to bone Chronic Foley -Back pain likely multifactorial, related to acute L2 compression fracture, bony metastases (seen on prior PET scan 6/3 )and possible UTI - CT abdomen and pelvis showing new acute compression deformity superior endplate of L2 as well as inflammation worrisome for cystitis -Multimodal pain management -Patient was last seen by urology on 6/7 with the following plan "Tentatively consider HOLEP in 4 to 5 weeks if clinically stable and able to tolerate surgery... pain medications and a palliative care evaluation" -Follow-up urine culture to evaluate for possibility of UTI.  Will hold off on antibiotics  Uncontrolled type 2 diabetes mellitus with hyperglycemia, with long-term current use of insulin (HCC) Blood sugar was 386 with EMS improving to 202 by arrival following fluid bolus Continue basal insulin Sliding scale coverage  Chronic kidney disease, stage 3a (HCC) Renal function at baseline  Chemotherapy-induced neuropathy (HCC) Not currently on specific meds        DVT prophylaxis: SCD   Consults: none  Advance Care Planning:   Code Status: Prior   Family Communication:  Sister and wife at bedside  Disposition Plan: Back to previous home environment  Severity of Illness: The appropriate patient status for this patient is INPATIENT. Inpatient status is judged to be reasonable and necessary in order to provide the required intensity of service to ensure the patient's safety. The patient's presenting symptoms, physical exam findings, and initial radiographic and laboratory data in the context of their chronic comorbidities is felt to place them at high risk for further clinical deterioration. Furthermore, it is not anticipated that the patient will be medically stable for discharge from the hospital within 2 midnights of admission.   * I certify that at the point of  admission it is my clinical judgment that the patient will require inpatient hospital care spanning beyond 2 midnights from the point of admission due to high intensity of service, high risk for further deterioration and high frequency of surveillance required.*  Author: Andris Baumann, MD 11/11/2022 12:27 AM  For on call review [email or web address].

## 2022-11-11 NOTE — Assessment & Plan Note (Signed)
Blood sugar was 386 with EMS improving to 202 by arrival following fluid bolus Continue basal insulin Sliding scale coverage

## 2022-11-11 NOTE — Assessment & Plan Note (Signed)
Not currently on specific meds 

## 2022-11-11 NOTE — Assessment & Plan Note (Addendum)
Acute L2 mild compression fracture Prostate cancer metastatic to bone Chronic Foley -Back pain likely multifactorial, related to acute L2 compression fracture, bony metastases (seen on prior PET scan 6/3 )and possible UTI - CT abdomen and pelvis showing new acute compression deformity superior endplate of L2 as well as inflammation worrisome for cystitis -Multimodal pain management -Patient was last seen by urology on 6/7 with the following plan "Tentatively consider HOLEP in 4 to 5 weeks if clinically stable and able to tolerate surgery... pain medications and a palliative care evaluation" -Follow-up urine culture to evaluate for possibility of UTI.  Will hold off on antibiotics

## 2022-11-12 DIAGNOSIS — E875 Hyperkalemia: Secondary | ICD-10-CM

## 2022-11-12 DIAGNOSIS — T83511A Infection and inflammatory reaction due to indwelling urethral catheter, initial encounter: Secondary | ICD-10-CM

## 2022-11-12 DIAGNOSIS — N390 Urinary tract infection, site not specified: Secondary | ICD-10-CM | POA: Diagnosis not present

## 2022-11-12 DIAGNOSIS — D649 Anemia, unspecified: Secondary | ICD-10-CM | POA: Diagnosis not present

## 2022-11-12 LAB — GLUCOSE, CAPILLARY
Glucose-Capillary: 108 mg/dL — ABNORMAL HIGH (ref 70–99)
Glucose-Capillary: 135 mg/dL — ABNORMAL HIGH (ref 70–99)
Glucose-Capillary: 157 mg/dL — ABNORMAL HIGH (ref 70–99)
Glucose-Capillary: 164 mg/dL — ABNORMAL HIGH (ref 70–99)
Glucose-Capillary: 69 mg/dL — ABNORMAL LOW (ref 70–99)
Glucose-Capillary: 74 mg/dL (ref 70–99)
Glucose-Capillary: 79 mg/dL (ref 70–99)

## 2022-11-12 LAB — CBC
HCT: 21.2 % — ABNORMAL LOW (ref 39.0–52.0)
Hemoglobin: 6.8 g/dL — ABNORMAL LOW (ref 13.0–17.0)
MCH: 27.8 pg (ref 26.0–34.0)
MCHC: 32.1 g/dL (ref 30.0–36.0)
MCV: 86.5 fL (ref 80.0–100.0)
Platelets: 112 10*3/uL — ABNORMAL LOW (ref 150–400)
RBC: 2.45 MIL/uL — ABNORMAL LOW (ref 4.22–5.81)
RDW: 18.4 % — ABNORMAL HIGH (ref 11.5–15.5)
WBC: 6.5 10*3/uL (ref 4.0–10.5)
nRBC: 4.6 % — ABNORMAL HIGH (ref 0.0–0.2)

## 2022-11-12 LAB — BASIC METABOLIC PANEL
Anion gap: 9 (ref 5–15)
BUN: 37 mg/dL — ABNORMAL HIGH (ref 8–23)
CO2: 26 mmol/L (ref 22–32)
Calcium: 9.3 mg/dL (ref 8.9–10.3)
Chloride: 101 mmol/L (ref 98–111)
Creatinine, Ser: 1.3 mg/dL — ABNORMAL HIGH (ref 0.61–1.24)
GFR, Estimated: 55 mL/min — ABNORMAL LOW (ref >60)
Glucose, Bld: 110 mg/dL — ABNORMAL HIGH (ref 70–99)
Potassium: 5.2 mmol/L — ABNORMAL HIGH (ref 3.5–5.1)
Sodium: 136 mmol/L (ref 135–145)

## 2022-11-12 LAB — TYPE AND SCREEN: Unit division: 0

## 2022-11-12 LAB — HEMOGLOBIN AND HEMATOCRIT, BLOOD
HCT: 20.7 % — ABNORMAL LOW (ref 39.0–52.0)
Hemoglobin: 6.9 g/dL — ABNORMAL LOW (ref 13.0–17.0)

## 2022-11-12 LAB — PREPARE RBC (CROSSMATCH)

## 2022-11-12 LAB — URINE CULTURE: Culture: >=100000 — AB

## 2022-11-12 LAB — BPAM RBC: ISSUE DATE / TIME: 202407060214

## 2022-11-12 LAB — POTASSIUM: Potassium: 4.2 mmol/L (ref 3.5–5.1)

## 2022-11-12 MED ORDER — INSULIN GLARGINE-YFGN 100 UNIT/ML ~~LOC~~ SOLN
15.0000 [IU] | Freq: Every day | SUBCUTANEOUS | Status: DC
  Administered 2022-11-13 – 2022-11-15 (×3): 15 [IU] via SUBCUTANEOUS
  Filled 2022-11-12 (×6): qty 0.15

## 2022-11-12 MED ORDER — LORATADINE 10 MG PO TABS
10.0000 mg | ORAL_TABLET | Freq: Every day | ORAL | Status: DC
  Administered 2022-11-12 – 2022-11-16 (×5): 10 mg via ORAL
  Filled 2022-11-12 (×5): qty 1

## 2022-11-12 MED ORDER — SODIUM CHLORIDE 0.9% IV SOLUTION: Freq: Once | INTRAVENOUS | Status: AC

## 2022-11-12 MED ORDER — SODIUM ZIRCONIUM CYCLOSILICATE 10 G PO PACK
10.0000 g | PACK | Freq: Once | ORAL | Status: AC
  Administered 2022-11-12: 10 g via ORAL
  Filled 2022-11-12: qty 1

## 2022-11-12 MED ORDER — SODIUM CHLORIDE 0.9 % IV SOLN
1.0000 g | INTRAVENOUS | Status: DC
  Administered 2022-11-12 – 2022-11-13 (×2): 1 g via INTRAVENOUS
  Filled 2022-11-12 (×2): qty 10

## 2022-11-12 MED ORDER — GUAIFENESIN ER 600 MG PO TB12
600.0000 mg | ORAL_TABLET | Freq: Two times a day (BID) | ORAL | Status: DC | PRN
  Administered 2022-11-12 – 2022-11-13 (×2): 600 mg via ORAL
  Filled 2022-11-12 (×2): qty 1

## 2022-11-12 NOTE — Progress Notes (Signed)
There was consult for 2nd PIV access. Patient is getting IV antibiotic once a day and prn pain medications. One PIV access is fine at this time. Secure chat with patient's RN regarding this matter. RN Penni Bombard understood it and stay with one PIV access for now. HS [employer]

## 2022-11-12 NOTE — Progress Notes (Signed)
Hypoglycemic Event  CBG: 69  Treatment: 8 oz juice/soda  Symptoms: None  Follow-up CBG: Time:0918  CBG Result:74   Possible Reasons for Event: Unknown  Comments/MD notified:Dr. Lahoma Rocker

## 2022-11-12 NOTE — Progress Notes (Signed)
PROGRESS NOTE   HPI was taken from Dr. Para March: Keith Howe is a 82 y.o. male with medical history significant for DM, HTN, prostate cancer with extensive skeletal metastases s/p RT and on Eligard, currently with indwelling Foley, CKD 3A,  chemotherapy-induced neuropathy, and chronic back pain, with history of hospitalization from 5/16 to 09/26/2022 for symptomatic anemia attributed to chemotherapy, receiving 4 units PRBCs, and who continues on outpatient Retacrit, last seen by his oncologist, Dr. Cathie Hoops on 6/27, who presents to the ED with persistent back pain not responding to outpatient Aleve and Tylenol.  He denies new bowel or bladder dysfunction and denies numbness in the groin and inner thigh area.  Denies weakness in the legs..  He spoke to the the oncology office earlier in the day and they called in a prescription for oxycodone however EMS was called due to unrelieved pain.  Patient was hypotensive with EMS and with a blood sugar of 386.  He received a 500 mL LR bolus and route. ED course and data review: Mildly tachycardic to 110 with soft BP of 103/53 and otherwise normal vitals. Labs most notable for hemoglobin of 5, down from 7.2 about 10 days prior.  Creatinine at baseline at 1.27, glucose elevated at 206.  Urinalysis showed large leukocyte esterase and rare bacteria. EKG, personally viewed and interpreted showing sinus tachycardia with no acute ST-T wave changes. CT abdomen and pelvis with contrast pending Patient given an NS bolus, morphine and Zofran. Hospitalist consulted for admission.      CT abdomen and pelvis resulted after admission showed the following IMPRESSION: 1. Marked diffuse bladder wall thickening with surrounding inflammation worrisome for cystitis. 2. Patchy areas of hypoattenuation in the superior pole the right kidney may represent pyelonephritis. 3. New acute mild compression deformity of the superior endplate of L2. 4. Trace right pleural  effusion.    EGON DIVELBISS  WUJ:[id number] DOB: August 27, 1940 DOA: 11/10/2022 PCP: Corky Downs, MD   Assessment & Plan:   Principal Problem:   Symptomatic anemia Active Problems:   Normocytic anemia   Antineoplastic chemotherapy induced anemia   SIRS (systemic inflammatory response syndrome) (HCC)   Hypotension   Prostate cancer metastatic to bone (HCC)   Acute exacerbation of chronic low back pain   Uncontrolled type 2 diabetes mellitus with hyperglycemia, with long-term current use of insulin (HCC)   Chronic kidney disease, stage 3a (HCC)   Chemotherapy-induced neuropathy (HCC)   Compression fracture of L2 lumbar vertebra, closed, initial encounter (HCC)   Indwelling Foley catheter present  Assessment and Plan: Symptomatic anemia: likely secondary to chemo. S/p 1 unit of pRBCs transfused. H&H are trending down, will give 1 unit more of pRBCs today. Repeat H&H 4 hours post transfusion. Hx of transfusion of 4 units PRBCs for chemotherapy-induced anemia during hospitalization in May and currently receives Retacrit with oncology.     Hypotension: resolved. Keep MAP >65   HTN: holding home dose of metoprolol    Acute L2 mild compression fracture: likely pathological secondary to metastatic prostate cancer. Oxycodone, morphine prn for pain   UTI: likely secondary to chronic foley. Urine cx growing gram neg rods, sens pending. Started on IV rocephin as pt spiked a fever today. Blood cxs ordered. Last seen by urology on 6/7 with the following plan "Tentatively consider HOLEP in 4 to 5 weeks if clinically stable and able to tolerate surgery... pain medications and a palliative care evaluation". Has another outpatient appt w/ urology on 11/16/22 as per pt's wife  DM2: HbA1c 6.8 in 07/2022, well controlled. Continue on glargine, SSI w/ accuchecks. 1 episode of BS in 60s, decreased glargine dose  .  CKDIIIa: Cr is trending up slightly today. Avoid nephrotoxic meds   Hyperkalemia: lokelma x  1. Repeat potassium ordered   Chemotherapy-induced neuropathy: continue on home dose of gabapentin       DVT prophylaxis: SCDs Code Status: full  Family Communication: discussed pt's care w/ pt's wife at bedside and answered her questions  Disposition Plan: likely d/c back home  Level of care: Progressive Status is: Inpatient Remains inpatient appropriate because: severity of illness, requiring another pRBCs transfusion      Consultants:  Palliative care   Procedures:   Antimicrobials:    Subjective: Pt c/o malaise    Objective: Vitals:   11/11/22 2334 11/12/22 0340 11/12/22 0500 11/12/22 0759  BP: 131/75 133/65  121/63  Pulse: 73 (!) 114 98 (!) 119  Resp: 19 17  20   Temp: 98.8 F (37.1 C) 98.8 F (37.1 C)  (!) 101.5 F (38.6 C)  TempSrc:      SpO2: 95% 95%  94%  Weight:      Height:        Intake/Output Summary (Last 24 hours) at 11/12/2022 0853 Last data filed at 11/12/2022 0700 Gross per 24 hour  Intake 480 ml  Output 3825 ml  Net -3345 ml   Filed Weights   11/10/22 2148 11/11/22 0155  Weight: 101 kg 101.7 kg    Examination:  General exam: appears comfortable  Respiratory system: clear breath sounds b/l  Cardiovascular system: S1/S2+. No clicks or rubs   Gastrointestinal system: abd is soft, NT, ND & normal bowel sounds  Central nervous system: alert & awake. Moves all extremities  Psychiatry: judgement and insight appears at baseline. Flat mood and affect   Data Reviewed: I have personally reviewed following labs and imaging studies  CBC: Recent Labs  Lab 11/10/22 2208 11/11/22 0214 11/11/22 0734 11/11/22 1532 11/12/22 0516  WBC 10.2  --   --   --  6.5  HGB 5.6* 5.5* 7.0* 6.5* 6.8*  HCT 18.3*  --  21.5* 20.6* 21.2*  MCV 87.6  --   --   --  86.5  PLT 169  --   --   --  112*   Basic Metabolic Panel: Recent Labs  Lab 11/10/22 2208 11/12/22 0516  NA 134* 136  K 4.9 5.2*  CL 101 101  CO2 22 26  GLUCOSE 206* 110*  BUN 40* 37*   CREATININE 1.27* 1.30*  CALCIUM 8.9 9.3   GFR: Estimated Creatinine Clearance: 56.7 mL/min (A) (by C-G formula based on SCr of 1.3 mg/dL (H)). Liver Function Tests: No results for input(s): "AST", "ALT", "ALKPHOS", "BILITOT", "PROT", "ALBUMIN" in the last 168 hours. No results for input(s): "LIPASE", "AMYLASE" in the last 168 hours. No results for input(s): "AMMONIA" in the last 168 hours. Coagulation Profile: No results for input(s): "INR", "PROTIME" in the last 168 hours. Cardiac Enzymes: No results for input(s): "CKTOTAL", "CKMB", "CKMBINDEX", "TROPONINI" in the last 168 hours. BNP (last 3 results) No results for input(s): "PROBNP" in the last 8760 hours. HbA1C: No results for input(s): "HGBA1C" in the last 72 hours. CBG: Recent Labs  Lab 11/11/22 1630 11/11/22 2002 11/11/22 2336 11/12/22 0337 11/12/22 0801  GLUCAP 198* 224* 173* 108* 69*   Lipid Profile: No results for input(s): "CHOL", "HDL", "LDLCALC", "TRIG", "CHOLHDL", "LDLDIRECT" in the last 72 hours. Thyroid Function Tests: No results  for input(s): "TSH", "T4TOTAL", "FREET4", "T3FREE", "THYROIDAB" in the last 72 hours. Anemia Panel: No results for input(s): "VITAMINB12", "FOLATE", "FERRITIN", "TIBC", "IRON", "RETICCTPCT" in the last 72 hours. Sepsis Labs: No results for input(s): "PROCALCITON", "LATICACIDVEN" in the last 168 hours.  No results found for this or any previous visit (from the past 240 hour(s)).       Radiology Studies: CT ABDOMEN PELVIS W CONTRAST  Result Date: 11/11/2022 CLINICAL DATA:  Acute abdominal pain.  Back pain.  Prostate cancer. EXAM: CT ABDOMEN AND PELVIS WITH CONTRAST TECHNIQUE: Multidetector CT imaging of the abdomen and pelvis was performed using the standard protocol following bolus administration of intravenous contrast. RADIATION DOSE REDUCTION: This exam was performed according to the departmental dose-optimization program which includes automated exposure control, adjustment of  the mA and/or kV according to patient size and/or use of iterative reconstruction technique. CONTRAST:  OMNIPAQUE IOHEXOL 300 MG/ML  SOLN COMPARISON:  CT renal stone 10/13/2022.  PET-CT 10/09/2022. FINDINGS: Lower chest: There is a trace right pleural effusion. Hepatobiliary: No focal liver abnormality is seen. No gallstones, gallbladder wall thickening, or biliary dilatation. Pancreas: Unremarkable. No pancreatic ductal dilatation or surrounding inflammatory changes. Spleen: Normal in size without focal abnormality. Adrenals/Urinary Tract: Foley catheter is seen within bladder. There is marked diffuse bladder wall thickening with mild surrounding inflammation. Small amount of air in the bladder is likely related to Foley catheter placement. There is no hydronephrosis or perinephric fluid. There is some ill-defined patchy areas of hypoattenuation in the superior pole the right kidney which may represent pyelonephritis. There is no urinary tract calculus. The adrenal glands are within normal limits. Stomach/Bowel: Stomach is within normal limits. Appendix appears normal. No evidence of bowel wall thickening, distention, or inflammatory changes. Vascular/Lymphatic: Aortic atherosclerosis. No enlarged abdominal or pelvic lymph nodes. Reproductive: Prostate gland is enlarged. Prostate radiotherapy seeds are present. Other: There is a small fat containing right inguinal hernia. There is a small umbilical hernia containing nondilated bowel. There is no ascites. Small subcutaneous nodules in the anterior right abdominal wall are unchanged. Musculoskeletal: There is mild new compression deformity of the superior endplate of L2. Mild compression deformity of the superior endplate of L1 appears stable. Degenerative changes affect the spine and hips. IMPRESSION: 1. Marked diffuse bladder wall thickening with surrounding inflammation worrisome for cystitis. 2. Patchy areas of hypoattenuation in the superior pole the right  kidney may represent pyelonephritis. 3. New acute mild compression deformity of the superior endplate of L2. 4. Trace right pleural effusion. Aortic Atherosclerosis (ICD10-I70.0). Electronically Signed   By: Darliss Cheney M.D.   On: 11/11/2022 00:02        Scheduled Meds:  sodium chloride   Intravenous Once   Chlorhexidine Gluconate Cloth  6 each Topical Daily   gabapentin  200 mg Oral BID   insulin aspart  0-15 Units Subcutaneous Q4H   insulin glargine-yfgn  30 Units Subcutaneous Q2200   lidocaine  1 patch Transdermal Q24H   Continuous Infusions:   LOS: 1 day    Time spent: 37 mins     Charise Killian, MD [facility] Pager [phone]  If 7PM-7AM, please contact night-coverage [email or web address] 11/12/2022, 8:53 AM

## 2022-11-13 ENCOUNTER — Other Ambulatory Visit: Payer: Medicare HMO

## 2022-11-13 ENCOUNTER — Inpatient Hospital Stay: Payer: Medicare HMO

## 2022-11-13 DIAGNOSIS — M4800 Spinal stenosis, site unspecified: Secondary | ICD-10-CM | POA: Insufficient documentation

## 2022-11-13 DIAGNOSIS — N400 Enlarged prostate without lower urinary tract symptoms: Secondary | ICD-10-CM | POA: Insufficient documentation

## 2022-11-13 LAB — TYPE AND SCREEN: ABO/RH(D): B POS

## 2022-11-13 LAB — CBC
HCT: 22 % — ABNORMAL LOW (ref 39.0–52.0)
Hemoglobin: 7.3 g/dL — ABNORMAL LOW (ref 13.0–17.0)
MCH: 27.5 pg (ref 26.0–34.0)
MCHC: 33.2 g/dL (ref 30.0–36.0)
MCV: 83 fL (ref 80.0–100.0)
Platelets: 114 10*3/uL — ABNORMAL LOW (ref 150–400)
RBC: 2.65 MIL/uL — ABNORMAL LOW (ref 4.22–5.81)
RDW: 17.7 % — ABNORMAL HIGH (ref 11.5–15.5)
WBC: 3.7 10*3/uL — ABNORMAL LOW (ref 4.0–10.5)
nRBC: 4.1 % — ABNORMAL HIGH (ref 0.0–0.2)

## 2022-11-13 LAB — RESPIRATORY PANEL BY PCR

## 2022-11-13 LAB — GLUCOSE, CAPILLARY
Glucose-Capillary: 104 mg/dL — ABNORMAL HIGH (ref 70–99)
Glucose-Capillary: 61 mg/dL — ABNORMAL LOW (ref 70–99)
Glucose-Capillary: 209 mg/dL — ABNORMAL HIGH (ref 70–99)
Glucose-Capillary: 220 mg/dL — ABNORMAL HIGH (ref 70–99)
Glucose-Capillary: 208 mg/dL — ABNORMAL HIGH (ref 70–99)
Glucose-Capillary: 193 mg/dL — ABNORMAL HIGH (ref 70–99)

## 2022-11-13 LAB — BPAM RBC
Blood Product Expiration Date: 202407152359
Blood Product Expiration Date: 202407302359
ISSUE DATE / TIME: 202407071113
Unit Type and Rh: 1700
Unit Type and Rh: 7300

## 2022-11-13 LAB — BASIC METABOLIC PANEL
Anion gap: 9 (ref 5–15)
BUN: 32 mg/dL — ABNORMAL HIGH (ref 8–23)
CO2: 25 mmol/L (ref 22–32)
Calcium: 9 mg/dL (ref 8.9–10.3)
Chloride: 101 mmol/L (ref 98–111)
Creatinine, Ser: 1.23 mg/dL (ref 0.61–1.24)
GFR, Estimated: 59 mL/min — ABNORMAL LOW (ref >60)
Glucose, Bld: 100 mg/dL — ABNORMAL HIGH (ref 70–99)
Potassium: 4.1 mmol/L (ref 3.5–5.1)
Sodium: 135 mmol/L (ref 135–145)

## 2022-11-13 LAB — SARS CORONAVIRUS 2 BY RT PCR: SARS Coronavirus 2 by RT PCR: NEGATIVE

## 2022-11-13 MED ORDER — INSULIN ASPART 100 UNIT/ML IJ SOLN
0.0000 [IU] | Freq: Every day | INTRAMUSCULAR | Status: DC
  Administered 2022-11-14 – 2022-11-15 (×2): 2 [IU] via SUBCUTANEOUS
  Filled 2022-11-13 (×2): qty 1

## 2022-11-13 MED ORDER — METOPROLOL TARTRATE 25 MG PO TABS
25.0000 mg | ORAL_TABLET | Freq: Two times a day (BID) | ORAL | Status: DC
  Administered 2022-11-13 – 2022-11-15 (×6): 25 mg via ORAL
  Filled 2022-11-13 (×6): qty 1

## 2022-11-13 MED ORDER — IOHEXOL 300 MG/ML  SOLN
100.0000 mL | Freq: Once | INTRAMUSCULAR | Status: AC | PRN
  Administered 2022-11-13: 100 mL via INTRAVENOUS

## 2022-11-13 MED ORDER — SODIUM CHLORIDE 0.9 % IV SOLN
2.0000 g | Freq: Two times a day (BID) | INTRAVENOUS | Status: DC
  Administered 2022-11-13 – 2022-11-14 (×4): 2 g via INTRAVENOUS
  Filled 2022-11-13 (×5): qty 12.5

## 2022-11-13 MED ORDER — PHENOL 1.4 % MT LIQD
1.0000 | OROMUCOSAL | Status: DC | PRN
  Filled 2022-11-13: qty 177

## 2022-11-13 MED ORDER — INSULIN ASPART 100 UNIT/ML IJ SOLN
0.0000 [IU] | Freq: Three times a day (TID) | INTRAMUSCULAR | Status: DC
  Administered 2022-11-13 (×2): 3 [IU] via SUBCUTANEOUS
  Administered 2022-11-14: 2 [IU] via SUBCUTANEOUS
  Administered 2022-11-14: 3 [IU] via SUBCUTANEOUS
  Administered 2022-11-15 – 2022-11-16 (×3): 2 [IU] via SUBCUTANEOUS
  Filled 2022-11-13 (×8): qty 1

## 2022-11-13 MED ORDER — INSULIN ASPART 100 UNIT/ML IJ SOLN
0.0000 [IU] | Freq: Three times a day (TID) | INTRAMUSCULAR | Status: DC
Start: 2022-11-13 — End: 2022-11-13

## 2022-11-13 MED ORDER — SENNA 8.6 MG PO TABS
1.0000 | ORAL_TABLET | Freq: Every day | ORAL | Status: DC
  Administered 2022-11-13 – 2022-11-16 (×4): 8.6 mg via ORAL
  Filled 2022-11-13 (×4): qty 1

## 2022-11-13 MED ORDER — POLYETHYLENE GLYCOL 3350 17 G PO PACK
17.0000 g | PACK | Freq: Every day | ORAL | Status: DC
  Administered 2022-11-13 – 2022-11-16 (×4): 17 g via ORAL
  Filled 2022-11-13 (×4): qty 1

## 2022-11-13 MED ORDER — IOHEXOL 9 MG/ML PO SOLN
500.0000 mL | ORAL | Status: AC
  Administered 2022-11-13 (×2): 500 mL via ORAL

## 2022-11-13 MED ORDER — METOPROLOL TARTRATE 50 MG PO TABS: 50.0000 mg | ORAL_TABLET | Freq: Two times a day (BID) | ORAL | Status: DC

## 2022-11-13 MED ORDER — GUAIFENESIN-DM 100-10 MG/5ML PO SYRP: 5.0000 mL | ORAL_SOLUTION | ORAL | Status: DC | PRN

## 2022-11-13 NOTE — Inpatient Diabetes Management (Addendum)
Inpatient Diabetes Program Recommendations  AACE/ADA: New Consensus Statement on Inpatient Glycemic Control (2015)  Target Ranges:  Prepandial:   less than 140 mg/dL      Peak postprandial:   less than 180 mg/dL (1-2 hours)      Critically ill patients:  140 - 180 mg/dL    Latest Reference Range & Units 07/31/22 16:23  Hemoglobin A1C 4.8 - 5.6 % 6.8 (H)  (H): Data is abnormally high  Latest Reference Range & Units 11/11/22 23:36 11/12/22 03:37 11/12/22 08:01 11/12/22 09:17 11/12/22 12:05 11/12/22 16:29 11/12/22 19:50  Glucose-Capillary 70 - 99 mg/dL 960 (H)  2 units Novolog  108 (H) 69 (L) 74 164 (H)  3 units Novolog  157 (H)  3 units Novolog  135 (H)  (H): Data is abnormally high (L): Data is abnormally low  Latest Reference Range & Units 11/12/22 23:47 11/13/22 04:36 11/13/22 09:04  Glucose-Capillary 70 - 99 mg/dL 79 454 (H) 61 (L)  (H): Data is abnormally high (L): Data is abnormally low   Admit with: Symptomatic anemia: likely secondary to chemo   History: DM, Prostate cancer with extensive skeletal metastases, CKD   Home DM Meds: Farxiga 10 mg daily       Novolog per SSI       Tresiba 80 units QAM       Trulicity 1.5 mg Qweek  Current Orders: Semglee 15 units QPM     Novolog Moderate Correction Scale/ SSI (0-15 units) Q4 hours    MD- Note Semglee dose was reduced to 15 units QPM last night, however, Semglee was HELD last PM per [facility] due to pt refusing dose  Hypoglycemia again this AM (CBG 61 at 9am)  May consider stopping Semglee for now    --Will follow patient during hospitalization--  Ambrose Finland RN, MSN, CDCES Diabetes Coordinator Inpatient Glycemic Control Team Team Pager: [phone] (8a-5p)

## 2022-11-13 NOTE — Progress Notes (Signed)
PROGRESS NOTE   HPI was taken from Dr. Para March: Keith Howe is a 82 y.o. male with medical history significant for DM, HTN, prostate cancer with extensive skeletal metastases s/p RT and on Eligard, currently with indwelling Foley, CKD 3A,  chemotherapy-induced neuropathy, and chronic back pain, with history of hospitalization from 5/16 to 09/26/2022 for symptomatic anemia attributed to chemotherapy, receiving 4 units PRBCs, and who continues on outpatient Retacrit, last seen by his oncologist, Dr. Cathie Hoops on 6/27, who presents to the ED with persistent back pain not responding to outpatient Aleve and Tylenol.  He denies new bowel or bladder dysfunction and denies numbness in the groin and inner thigh area.  Denies weakness in the legs..  He spoke to the the oncology office earlier in the day and they called in a prescription for oxycodone however EMS was called due to unrelieved pain.  Patient was hypotensive with EMS and with a blood sugar of 386.  He received a 500 mL LR bolus and route. ED course and data review: Mildly tachycardic to 110 with soft BP of 103/53 and otherwise normal vitals. Labs most notable for hemoglobin of 5, down from 7.2 about 10 days prior.  Creatinine at baseline at 1.27, glucose elevated at 206.  Urinalysis showed large leukocyte esterase and rare bacteria. EKG, personally viewed and interpreted showing sinus tachycardia with no acute ST-T wave changes. CT abdomen and pelvis with contrast pending Patient given an NS bolus, morphine and Zofran. Hospitalist consulted for admission.      CT abdomen and pelvis resulted after admission showed the following IMPRESSION: 1. Marked diffuse bladder wall thickening with surrounding inflammation worrisome for cystitis. 2. Patchy areas of hypoattenuation in the superior pole the right kidney may represent pyelonephritis. 3. New acute mild compression deformity of the superior endplate of L2. 4. Trace right pleural  effusion.    DUVID FAVRET  ZOX:[id number] DOB: February 06, 1941 DOA: 11/10/2022 PCP: Corky Downs, MD   Assessment & Plan:   Principal Problem:   Symptomatic anemia Active Problems:   Anemia in stage 3 chronic kidney disease (HCC)   Normocytic anemia   Antineoplastic chemotherapy induced anemia   SIRS (systemic inflammatory response syndrome) (HCC)   Hypotension   Essential hypertension   Prostate cancer metastatic to bone (HCC)   Acute exacerbation of chronic low back pain   Uncontrolled type 2 diabetes mellitus with hyperglycemia, with long-term current use of insulin (HCC)   Chronic kidney disease, stage 3a (HCC)   Chemotherapy-induced neuropathy (HCC)   Compression fracture of L2 lumbar vertebra, closed, initial encounter (HCC)   Indwelling Foley catheter present  Assessment and Plan: Symptomatic anemia: likely secondary to chemo. S/p 2 unit sof pRBCs transfused. Hgb this morning appropriate. Hx of transfusion of 4 units PRBCs for chemotherapy-induced anemia during hospitalization in May and currently receives Retacrit with oncology. No signs bleeding    Hypotension: resolved. Keep MAP >65   HTN: will resume home metop but at lower dose (25 bid instead of 50)   Acute L2 mild compression fracture: likely pathological secondary to metastatic prostate cancer. Oxycodone, morphine prn for pain. Have ordered PT consult for today  Complicated UTI: likely secondary to chronic foley. Ct with possible pyelo. Serratia and enterobacter on culture from June. Serratia growing this urine culture. Continues to spike fevers. Blood cultures ngtd.  Last seen by urology on 6/7 with the following plan "Tentatively consider HOLEP in 4 to 5 weeks if clinically stable and able to tolerate surgery... pain medications  and a palliative care evaluation". Has another outpatient appt w/ urology on 11/16/22 as per pt's wife - f/u cultures - repeat CT given persistent fevers - will broaden to cefepime pending  culture results - exchange foley today - will also check covid and rvp and cxr given wife report of some cough though lungs clear on exam   DM2: HbA1c 6.8 in 07/2022, well controlled. Hypoglycemic this morning, will stop semglee and reduce  .  CKDIIIa: stable at baseline  Chemotherapy-induced neuropathy: continue on home dose of gabapentin   Constipation: reports no bm since admission. Will start miralax/senna      DVT prophylaxis: SCDs Code Status: full  Family Communication: discussed pt's care w/ pt's wife at bedside and answered her questions  Disposition Plan: tbd pending pt consult  Level of care: Progressive Status is: Inpatient Remains inpatient appropriate because: severity of illness       Consultants:  Palliative care   Procedures:   Antimicrobials: ceftriaxone>cefepime   Subjective: No complaints  Objective: Vitals:   11/13/22 0000 11/13/22 0500 11/13/22 0545 11/13/22 0914  BP: 122/67 105/62  (!) 117/52  Pulse: (!) 110 (!) 110  (!) 111  Resp: 18 16  18   Temp: 100 F (37.8 C) (!) 101.1 F (38.4 C) 100.1 F (37.8 C) 98.3 F (36.8 C)  TempSrc:  Oral Oral   SpO2:    97%  Weight:      Height:        Intake/Output Summary (Last 24 hours) at 11/13/2022 1023 Last data filed at 11/13/2022 0900 Gross per 24 hour  Intake 480 ml  Output 2300 ml  Net -1820 ml   Filed Weights   11/10/22 2148 11/11/22 0155  Weight: 101 kg 101.7 kg    Examination:  General exam: appears comfortable  Respiratory system: clear breath sounds b/l  Cardiovascular system: S1/S2+. No clicks or rubs   Gastrointestinal system: abd is soft, NT, ND & normal bowel sounds  Central nervous system: alert & awake. Moves all extremities  Psychiatry: judgement and insight appears at baseline. Flat mood and affect   Data Reviewed: I have personally reviewed following labs and imaging studies  CBC: Recent Labs  Lab 11/10/22 2208 11/11/22 0214 11/11/22 0734 11/11/22 1532  11/12/22 0516 11/12/22 1906 11/13/22 0424  WBC 10.2  --   --   --  6.5  --  3.7*  HGB 5.6*   < > 7.0* 6.5* 6.8* 6.9* 7.3*  HCT 18.3*  --  21.5* 20.6* 21.2* 20.7* 22.0*  MCV 87.6  --   --   --  86.5  --  83.0  PLT 169  --   --   --  112*  --  114*   < > = values in this interval not displayed.   Basic Metabolic Panel: Recent Labs  Lab 11/10/22 2208 11/12/22 0516 11/12/22 1548 11/13/22 0424  NA 134* 136  --  135  K 4.9 5.2* 4.2 4.1  CL 101 101  --  101  CO2 22 26  --  25  GLUCOSE 206* 110*  --  100*  BUN 40* 37*  --  32*  CREATININE 1.27* 1.30*  --  1.23  CALCIUM 8.9 9.3  --  9.0   GFR: Estimated Creatinine Clearance: 60 mL/min (by C-G formula based on SCr of 1.23 mg/dL). Liver Function Tests: No results for input(s): "AST", "ALT", "ALKPHOS", "BILITOT", "PROT", "ALBUMIN" in the last 168 hours. No results for input(s): "LIPASE", "AMYLASE" in the  last 168 hours. No results for input(s): "AMMONIA" in the last 168 hours. Coagulation Profile: No results for input(s): "INR", "PROTIME" in the last 168 hours. Cardiac Enzymes: No results for input(s): "CKTOTAL", "CKMB", "CKMBINDEX", "TROPONINI" in the last 168 hours. BNP (last 3 results) No results for input(s): "PROBNP" in the last 8760 hours. HbA1C: No results for input(s): "HGBA1C" in the last 72 hours. CBG: Recent Labs  Lab 11/12/22 1629 11/12/22 1950 11/12/22 2347 11/13/22 0436 11/13/22 0904  GLUCAP 157* 135* 79 104* 61*   Lipid Profile: No results for input(s): "CHOL", "HDL", "LDLCALC", "TRIG", "CHOLHDL", "LDLDIRECT" in the last 72 hours. Thyroid Function Tests: No results for input(s): "TSH", "T4TOTAL", "FREET4", "T3FREE", "THYROIDAB" in the last 72 hours. Anemia Panel: No results for input(s): "VITAMINB12", "FOLATE", "FERRITIN", "TIBC", "IRON", "RETICCTPCT" in the last 72 hours. Sepsis Labs: No results for input(s): "PROCALCITON", "LATICACIDVEN" in the last 168 hours.  Recent Results (from the past 240  hour(s))  Urine Culture     Status: Abnormal (Preliminary result)   Collection Time: 11/10/22 10:00 PM   Specimen: Urine, Random  Result Value Ref Range Status   Specimen Description   Final    URINE, RANDOM Performed at [facility], [address] [place], [place] [address]    Special Requests   Final    NONE Reflexed from [phone] Performed at [facility] [address] [place], [place] [address]    Culture (A)  Final    >=100,000 COLONIES/mL SERRATIA MARCESCENS CONFIRMATION OF SUSCEPTIBILITIES IN PROGRESS Performed at [facility], [address] [place], [place] [address]    Report Status PENDING  Incomplete  Culture, blood (Routine X 2) w Reflex to ID Panel     Status: None (Preliminary result)   Collection Time: 11/12/22 10:56 AM   Specimen: BLOOD  Result Value Ref Range Status   Specimen Description BLOOD BLOOD LEFT HAND  Final   Special Requests   Final    BOTTLES DRAWN AEROBIC AND ANAEROBIC Blood Culture adequate volume   Culture   Final    NO GROWTH < 24 HOURS Performed at [facility], [address], [place], [place] [address]    Report Status PENDING  Incomplete  Culture, blood (Routine X 2) w Reflex to ID Panel     Status: None (Preliminary result)   Collection Time: 11/12/22 10:56 AM   Specimen: BLOOD  Result Value Ref Range Status   Specimen Description BLOOD BLOOD RIGHT HAND  Final   Special Requests   Final    BOTTLES DRAWN AEROBIC AND ANAEROBIC Blood Culture adequate volume   Culture   Final    NO GROWTH < 24 HOURS Performed at [facility], [address], [place], [place] [address]    Report Status PENDING  Incomplete         Radiology Studies: No results found.      Scheduled Meds:  Chlorhexidine Gluconate Cloth  6 each Topical Daily   gabapentin  200 mg Oral BID   insulin aspart  0-15 Units Subcutaneous Q4H   insulin glargine-yfgn  15 Units Subcutaneous Q2200   lidocaine  1 patch  Transdermal Q24H   loratadine  10 mg Oral Daily   Continuous Infusions:  cefTRIAXone (ROCEPHIN)  IV 1 g (11/13/22 0909)     LOS: 2 days    Time spent: 40 mins     Silvano Bilis, MD [facility]  If 7PM-7AM, please contact night-coverage [email or web address] 11/13/2022, 10:23 AM

## 2022-11-13 NOTE — Consult Note (Signed)
Pharmacy Antibiotic Note  Keith Howe is a 82 y.o. male admitted on 11/10/2022 with UTI.  Pharmacy has been consulted to dose Cefepime dosing.  Plan: Abx day #2 Cefepime 2gm IV q 12hrs for CrCl 60 ml/min  Height: 6\' 2"  (188 cm) Weight: 101.7 kg (224 lb 3.2 oz) IBW/kg (Calculated) : 82.2  Temp (24hrs), Avg:99.7 F (37.6 C), Min:98.3 F (36.8 C), Max:101.1 F (38.4 C)  Recent Labs  Lab 11/10/22 2208 11/12/22 0516 11/13/22 0424  WBC 10.2 6.5 3.7*  CREATININE 1.27* 1.30* 1.23    Estimated Creatinine Clearance: 60 mL/min (by C-G formula based on SCr of 1.23 mg/dL).    No Known Allergies  Antimicrobials this admission: 7/7 Ceftriaxone >> 7/8 7/8 cefepime >>   Dose adjustments this admission: none  Microbiology results: 7/7 BCx: pending 7/5 UCx: Serratia marcensces - sus in process   Thank you for allowing pharmacy to be a part of this patient's care.  Nathanel Tallman Rodriguez-Guzman PharmD, BCPS 11/13/2022 11:02 AM

## 2022-11-13 NOTE — Evaluation (Addendum)
Physical Therapy Evaluation Patient Details Name: Keith Howe MRN: [id number] DOB: 06-18-1940 Today's Date: 11/13/2022  History of Present Illness  Keith Howe is a 82 y.o. male who presents with low back pain, hypotension and hyperglycemia, imaging reveals acute L2 endplate compression fracture, low Hgb, administered blood transfusion. PMH significant of prostate cancer on chemotherapy, hypertension, diabetes mellitus, obesity, BPH, CKD-3B, chronic back pain, hard of hearing, anemia.  Clinical Impression  Pt is in bed, pts wife and doctor present. Pt reports mild back pain at rest and throughout session. PTA pt was ambulatory using an QPC/RW at baseline and was able to complete ADL's, lives with wife at home. Pt performed bed mobility minA for scooting EOB, sit<>stand elevated EOB to RW c/ minA, sit<>stand chair c/ maxA+2 progressing to minA c/ cueing for foot placement and sequencing. Pt ambulated ~145ft c/ RW CGA and cueing for upright posture, deferred further ambulation d/t pts HR getting up to high 150's throughout gait trail, MD aware and instructed PT to monitor symptoms. Pt denied any dizziness c/ ambulation. Pt would benefit from acute therapy services to improve mobility, functional activity tolerance, and independence.      Assistance Recommended at Discharge Intermittent Supervision/Assistance  If plan is discharge home, recommend the following:  Can travel by private vehicle  A little help with walking and/or transfers;A little help with bathing/dressing/bathroom;Assist for transportation;Assistance with cooking/housework;Help with stairs or ramp for entrance;Direct supervision/assist for medications management        Equipment Recommendations None recommended by PT  Recommendations for Other Services       Functional Status Assessment Patient has had a recent decline in their functional status and demonstrates the ability to make significant improvements in function  in a reasonable and predictable amount of time.     Precautions / Restrictions Precautions Precautions: Fall Restrictions Weight Bearing Restrictions: No      Mobility  Bed Mobility Overal bed mobility: Needs Assistance Bed Mobility: Supine to Sit     Supine to sit: Min assist     General bed mobility comments: minA for scooting to EOB    Transfers Overall transfer level: Needs assistance Equipment used: Rolling walker (2 wheels) Transfers: Sit to/from Stand Sit to Stand: Min assist, Max assist, +2 physical assistance, From elevated surface           General transfer comment: minA for sit<>stand elevated EOB to RW, maxA+2 for sit<>stand chair>RW fluctuating to minA c/ cueing for foot placement and sequencing    Ambulation/Gait Ambulation/Gait assistance: Min guard Gait Distance (Feet): 100 Feet Assistive device: Rolling walker (2 wheels) Gait Pattern/deviations: Step-through pattern, Decreased stride length, Decreased step length - left, Decreased step length - right Gait velocity: decreased     General Gait Details: slow moving but does not report fatigue, deferred further ambulation d/t HR in 150's  Stairs            Wheelchair Mobility     Tilt Bed    Modified Rankin (Stroke Patients Only)       Balance Overall balance assessment: Needs assistance Sitting-balance support: Feet supported, Bilateral upper extremity supported Sitting balance-Leahy Scale: Good     Standing balance support: Bilateral upper extremity supported, Single extremity supported, During functional activity Standing balance-Leahy Scale: Fair Standing balance comment: able to temporarily remove UE to doff gown in standing at [employer]  Pertinent Vitals/Pain Pain Assessment Pain Assessment: Faces Faces Pain Scale: Hurts a little bit Pain Descriptors / Indicators: Discomfort Pain Intervention(s): Limited activity within patient's  tolerance, Monitored during session, Repositioned    Home Living Family/patient expects to be discharged to:: Private residence Living Arrangements: Spouse/significant other Available Help at Discharge: Family;Available 24 hours/day Type of Home: House Home Access: Level entry     Alternate Level Stairs-Number of Steps: 2 steps without rails up to kitchen, 6 steps to bedroom with R rail, 6 steps down to basement Home Layout: Bed/bath upstairs;Multi-level;Able to live on main level with bedroom/bathroom;Laundry or work area in basement Home Equipment: Gil[employer] Program[employer], applications (2 wheels);BSC/3in1;Shower seat Additional Comments: has been staying in the basement bedroom    Prior Function Prior Level of Function : Independent/Modified Independent             Mobility Comments: Pt reports he's ambulatory with QC or walker, denies falls       Hand Dominance        Extremity/Trunk Assessment   Upper Extremity Assessment Upper Extremity Assessment: Generalized weakness    Lower Extremity Assessment Lower Extremity Assessment: Generalized weakness       Communication   Communication: HOH  Cognition Arousal/Alertness: Awake/alert Behavior During Therapy: WFL for tasks assessed/performed Overall Cognitive Status: Within Functional Limits for tasks assessed                                 General Comments: oriented and pleasant        General Comments      Exercises     Assessment/Plan    PT Assessment Patient needs continued PT services  PT Problem List Decreased strength;Decreased range of motion;Decreased knowledge of use of DME;Decreased activity tolerance;Decreased safety awareness;Decreased balance;Decreased knowledge of precautions;Decreased mobility;Decreased coordination       PT Treatment Interventions DME instruction;Balance training;Gait training;Neuromuscular re-education;Stair training;Functional mobility training;Patient/family  education;Therapeutic activities;Therapeutic exercise    PT Goals (Current goals can be found in the Care Plan section)  Acute Rehab PT Goals Patient Stated Goal: to return to home PT Goal Formulation: With patient Time For Goal Achievement: 11/27/22 Potential to Achieve Goals: Good    Frequency Min 1X/week     Co-evaluation               AM-PAC PT "6 Clicks" Mobility  Outcome Measure Help needed turning from your back to your side while in a flat bed without using bedrails?: A Little Help needed moving from lying on your back to sitting on the side of a flat bed without using bedrails?: A Little Help needed moving to and from a bed to a chair (including a wheelchair)?: A Little Help needed standing up from a chair using your arms (e.g., wheelchair or bedside chair)?: A Little Help needed to walk in hospital room?: A Little Help needed climbing 3-5 steps with a railing? : A Lot 6 Click Score: 17    End of Session Equipment Utilized During Treatment: Gait belt Activity Tolerance: Patient tolerated treatment well Patient left: in chair;with call bell/phone within reach;with chair alarm set;with family/visitor present Nurse Communication: Mobility status PT Visit Diagnosis: Unsteadiness on feet (R26.81);Other abnormalities of gait and mobility (R26.89);Muscle weakness (generalized) (M62.81)    Time: 3086-5784 PT Time Calculation (min) (ACUTE ONLY): 35 min   Charges:   PT Evaluation $PT Eval Low Complexity: 1 Low PT Treatments $Therapeutic Exercise: 8-22 mins $Therapeutic Activity:  8-22 mins PT General Charges $$ ACUTE PT VISIT: 1 Visit        Lala Lund, PT, SPT  11:25 AM,11/13/22

## 2022-11-13 NOTE — Progress Notes (Signed)
Called pt to follow up. No answer. Detailed VM left asking pt to give a call back if he was still experiencing pain or pain worsened.

## 2022-11-14 DIAGNOSIS — C7951 Secondary malignant neoplasm of bone: Secondary | ICD-10-CM

## 2022-11-14 DIAGNOSIS — N1831 Chronic kidney disease, stage 3a: Secondary | ICD-10-CM

## 2022-11-14 DIAGNOSIS — D649 Anemia, unspecified: Secondary | ICD-10-CM | POA: Diagnosis not present

## 2022-11-14 DIAGNOSIS — Z978 Presence of other specified devices: Secondary | ICD-10-CM

## 2022-11-14 DIAGNOSIS — N183 Chronic kidney disease, stage 3 unspecified: Secondary | ICD-10-CM | POA: Diagnosis not present

## 2022-11-14 DIAGNOSIS — C61 Malignant neoplasm of prostate: Secondary | ICD-10-CM

## 2022-11-14 DIAGNOSIS — D631 Anemia in chronic kidney disease: Secondary | ICD-10-CM

## 2022-11-14 LAB — CBC
HCT: 21.3 % — ABNORMAL LOW (ref 39.0–52.0)
Hemoglobin: 6.8 g/dL — ABNORMAL LOW (ref 13.0–17.0)
MCH: 27.4 pg (ref 26.0–34.0)
MCHC: 31.9 g/dL (ref 30.0–36.0)
MCV: 85.9 fL (ref 80.0–100.0)
Platelets: 103 10*3/uL — ABNORMAL LOW (ref 150–400)
RBC: 2.48 MIL/uL — ABNORMAL LOW (ref 4.22–5.81)
RDW: 18 % — ABNORMAL HIGH (ref 11.5–15.5)
WBC: 3.5 10*3/uL — ABNORMAL LOW (ref 4.0–10.5)
nRBC: 4.3 % — ABNORMAL HIGH (ref 0.0–0.2)

## 2022-11-14 LAB — BASIC METABOLIC PANEL
Anion gap: 8 (ref 5–15)
BUN: 28 mg/dL — ABNORMAL HIGH (ref 8–23)
CO2: 27 mmol/L (ref 22–32)
Calcium: 9.1 mg/dL (ref 8.9–10.3)
Chloride: 100 mmol/L (ref 98–111)
Creatinine, Ser: 1.22 mg/dL (ref 0.61–1.24)
GFR, Estimated: 60 mL/min — ABNORMAL LOW (ref >60)
Glucose, Bld: 98 mg/dL (ref 70–99)
Potassium: 4.1 mmol/L (ref 3.5–5.1)
Sodium: 135 mmol/L (ref 135–145)

## 2022-11-14 LAB — RETIC PANEL
Immature Retic Fract: 23.7 % — ABNORMAL HIGH (ref 2.3–15.9)
RBC.: 2.77 MIL/uL — ABNORMAL LOW (ref 4.22–5.81)
Retic Count, Absolute: 82.5 10*3/uL (ref 19.0–186.0)
Retic Ct Pct: 3 % (ref 0.4–3.1)
Reticulocyte Hemoglobin: 21.1 pg — ABNORMAL LOW (ref >27.9)

## 2022-11-14 LAB — HEMOGLOBIN AND HEMATOCRIT, BLOOD
HCT: 23.8 % — ABNORMAL LOW (ref 39.0–52.0)
Hemoglobin: 7.9 g/dL — ABNORMAL LOW (ref 13.0–17.0)

## 2022-11-14 LAB — BPAM RBC
Blood Product Expiration Date: 202408062359
Unit Type and Rh: 7300

## 2022-11-14 LAB — PREPARE RBC (CROSSMATCH)

## 2022-11-14 LAB — GLUCOSE, CAPILLARY
Glucose-Capillary: 144 mg/dL — ABNORMAL HIGH (ref 70–99)
Glucose-Capillary: 170 mg/dL — ABNORMAL HIGH (ref 70–99)
Glucose-Capillary: 195 mg/dL — ABNORMAL HIGH (ref 70–99)
Glucose-Capillary: 245 mg/dL — ABNORMAL HIGH (ref 70–99)
Glucose-Capillary: 247 mg/dL — ABNORMAL HIGH (ref 70–99)
Glucose-Capillary: 70 mg/dL (ref 70–99)

## 2022-11-14 LAB — TYPE AND SCREEN

## 2022-11-14 LAB — URINE CULTURE

## 2022-11-14 MED ORDER — SODIUM CHLORIDE 0.9 % IV SOLN: INTRAVENOUS | Status: DC | PRN

## 2022-11-14 MED ORDER — ACETAMINOPHEN 325 MG PO TABS
650.0000 mg | ORAL_TABLET | Freq: Four times a day (QID) | ORAL | Status: DC | PRN
  Administered 2022-11-16 (×2): 650 mg via ORAL
  Filled 2022-11-14 (×2): qty 2

## 2022-11-14 MED ORDER — ACETAMINOPHEN 650 MG RE SUPP: 650.0000 mg | Freq: Four times a day (QID) | RECTAL | Status: DC | PRN

## 2022-11-14 MED ORDER — SODIUM CHLORIDE 0.9% IV SOLUTION: Freq: Once | INTRAVENOUS | Status: AC

## 2022-11-14 MED ORDER — LACTULOSE 10 GM/15ML PO SOLN
20.0000 g | Freq: Every day | ORAL | Status: DC
  Administered 2022-11-14 – 2022-11-16 (×3): 20 g via ORAL
  Filled 2022-11-14 (×3): qty 30

## 2022-11-14 NOTE — Plan of Care (Signed)
  Problem: Pain Managment: Goal: General experience of comfort will improve Outcome: Progressing   Problem: Safety: Goal: Ability to remain free from injury will improve Outcome: Progressing   Problem: Skin Integrity: Goal: Risk for impaired skin integrity will decrease Outcome: Progressing   

## 2022-11-14 NOTE — Consult Note (Signed)
Hematology/Oncology Consult note [phone] [phone]      Patient Care Team: Corky Downs, MD as PCP - General (Internal Medicine) Carmina Miller, MD as Consulting Physician (Radiation Oncology) Rickard Patience, MD as Consulting Physician (Oncology) Mady Haagensen, MD as Consulting Physician (Nephrology) Orson Ape, MD as Consulting Physician (Urology)   Name of the patient: Keith Howe  [id number]  12/08/1940   REASON FOR COSULTATION:   History of presenting illness-  82 y.o. male with PMH listed at below who presents to ER for evaluation of persistent back pain not responding to over-the-counter Tylenol and relief.  EMS was called and the patient was found to be hypotensive with blood sugar of 386.  Patient received 500 cc of fluid bolus en route. Emergency room, patient was found to be tachycardic, blood pressure 103/53, hemoglobin 5, down from 7.210 days prior.  Urinalysis showed large leukocyte esterase and rare bacteria.  11/11/2022 CT abdomen pelvis showed  1. Marked diffuse bladder wall thickening with surrounding inflammation worrisome for cystitis. 2. Patchy areas of hypoattenuation in the superior pole the right kidney may represent pyelonephritis. 3. New acute mild compression deformity of the superior endplate of L2. 4. Trace right pleural effusion. Aortic Atherosclerosis  Patient was admitted for symptomatic anemia, and UTI/pyelonephritis.  He had a repeat CT scan on 11/13/2022, which showed persistent heterogeneous enhancement of upper pole of right kidney, suspicious for focal pyelonephritis.  Mild enhancement of the right ureter and left renal collecting system, concerning for ascending urinary tract infection.  Circumferential bladder wall thickening with Foley catheter in place.  Enlarged prostate, trace bilateral pleural effusion and bibasilar atelectasis.  Patient received 2 units of PRBC transfusion.  Hemoglobin improved and then  dropped again today to 6.8.  Hematology oncology was consulted.  Patient is known to oncology service for stage IV prostate cancer.  He is continuing evaluation therapy with Eligard 45 mg every 6 months.  His disease progressed on Xtandi, he received 3 cycles of docetaxel did not tolerate due to recurrent severe anemia.  Decision was made not to proceed with any additional chemotherapy.  Last chemotherapy was in April 2024. His hemoglobin improved to 10 after he receives IV Venofer After his urology HOLEP procedure, hemoglobin dropped to 7.2 on 11/01/2022 Patient received 1 unit of PRBC outpatient. He is pending evaluation by interventional radiology for possible Pluvicto therapy.     No Known Allergies  Patient Active Problem List   Diagnosis Date Noted   Prostate cancer metastatic to bone (HCC) 07/13/2021    Priority: High   Hypotension 09/27/2022    Priority: Medium    Chemotherapy-induced neuropathy (HCC) 09/13/2022    Priority: Medium    Normocytic anemia 07/31/2022    Priority: Medium    Chronic kidney disease, stage 3a (HCC) 05/27/2020    Priority: Medium    Bilateral hydronephrosis 09/20/2022    Priority: Low   Weight loss 08/09/2022    Priority: Low   Chemotherapy induced neutropenia (HCC) 07/05/2022    Priority: Low   Encounter for antineoplastic chemotherapy 05/26/2022    Priority: Low   Goals of care, counseling/discussion 07/13/2021    Priority: Low   Type 2 diabetes mellitus without complication, with long-term current use of insulin (HCC) 10/13/2019    Priority: Low   Benign prostatic hyperplasia 11/13/2022   Spinal stenosis 11/13/2022   Symptomatic anemia 11/11/2022   Acute exacerbation of chronic low back pain 11/11/2022   Antineoplastic chemotherapy induced anemia 11/11/2022   Compression  fracture of L2 lumbar vertebra, closed, initial encounter (HCC) 11/11/2022   Indwelling Foley catheter present 11/11/2022   Anemia in stage 3 chronic kidney disease (HCC)  09/21/2022   Chronic kidney disease, stage 3b (HCC) 09/21/2022   Leukocytosis 09/21/2022   Syncope 07/31/2022   SIRS (systemic inflammatory response syndrome) (HCC) 07/31/2022   Obesity (BMI 30-39.9) 07/31/2022   Uncontrolled type 2 diabetes mellitus with hyperglycemia, with long-term current use of insulin (HCC) 07/31/2022   Myocardial injury 07/31/2022   Bilateral leg edema 07/31/2022   Injury of quadriceps muscle 08/26/2020   Annual physical exam 02/18/2020   Need for influenza vaccination 02/18/2020   Essential hypertension 12/15/2019   Class 1 obesity with serious comorbidity and body mass index (BMI) of 31.0 to 31.9 in adult 10/13/2019   S/P TKR (total knee replacement) using cement, left 03/19/2018     Past Medical History:  Diagnosis Date   BPH (benign prostatic hyperplasia)    Cancer (HCC)    Chronic kidney disease    STAGE 3   Diabetes mellitus without complication (HCC)    HOH (hard of hearing)    AIDS   Hypertension    Inguinal hernia    Pain    CHRONIC LBP   Prostate cancer [facility])      Past Surgical History:  Procedure Laterality Date   BACK SURGERY     Lumbar   CATARACT EXTRACTION W/PHACO Right 07/10/2017   Procedure: CATARACT EXTRACTION PHACO AND INTRAOCULAR LENS PLACEMENT (IOC);  Surgeon: Galen Manila, MD;  Location: [facility] ORS;  Service: Ophthalmology;  Laterality: Right;  [place] 00:29.2 AP% 16.5 CDE 4.83 Fluid Pack Lot # [id number]   COLONOSCOPY     EYE SURGERY Bilateral    cataract extraction   HERNIA REPAIR     INGUINAL   PROSTATE BIOPSY N/A 04/08/2020   Procedure: PROSTATE BIOPSY Addison Bailey;  Surgeon: Orson Ape, MD;  Location: [facility] ORS;  Service: Urology;  Laterality: N/A;   TOTAL KNEE ARTHROPLASTY Left 03/19/2018   Procedure: TOTAL KNEE ARTHROPLASTY;  Surgeon: Juanell Fairly, MD;  Location: [facility] ORS;  Service: Orthopedics;  Laterality: Left;    Social History   Socioeconomic History   Marital status: Married    Spouse name: Not on file    Number of children: Not on file   Years of education: Not on file   Highest education level: Some college, no degree  Occupational History   Not on file  Tobacco Use   Smoking status: Never    Passive exposure: Never   Smokeless tobacco: Never  Vaping Use   Vaping Use: Never used  Substance and Sexual Activity   Alcohol use: No   Drug use: Never   Sexual activity: Not Currently  Other Topics Concern   Not on file  Social History Narrative   Independent at baseline.  Lives at home with his wife   Social Determinants of Health   Financial Resource Strain: Low Risk  (05/12/2021)   Overall Financial Resource Strain (CARDIA)    Difficulty of Paying Living Expenses: Not hard at all  Food Insecurity: No Food Insecurity (11/11/2022)   Hunger Vital Sign    Worried About Running Out of Food in the Last Year: Never true    Ran Out of Food in the Last Year: Never true  Transportation Needs: No Transportation Needs (11/11/2022)   PRAPARE - Administrator, Civil Service (Medical): No    Lack of Transportation (Non-Medical): No  Physical Activity: Insufficiently Active (  05/12/2021)   Exercise Vital Sign    Days of Exercise per Week: 7 days    Minutes of Exercise per Session: 10 min  Stress: No Stress Concern Present (05/12/2021)   [employer] of Occupational Health - Occupational Stress Questionnaire    Feeling of Stress : Not at all  Social Connections: Socially Integrated (05/12/2021)   Social Connection and Isolation Panel [NHANES]    Frequency of Communication with Friends and Family: More than three times a week    Frequency of Social Gatherings with Friends and Family: Once a week    Attends Religious Services: More than 4 times per year    Active Member of [employer] or Organizations: Yes    Attends Engineer, structural: More than 4 times per year    Marital Status: Married  Catering manager Violence: Not At Risk (11/11/2022)   Humiliation, Afraid, Rape, and Kick  questionnaire    Fear of Current or Ex-Partner: No    Emotionally Abused: No    Physically Abused: No    Sexually Abused: No     Family History  Problem Relation Age of Onset   Diabetes Father    Breast cancer Sister      Current Facility-Administered Medications:    [START ON 11/15/2022] 0.9 %  sodium chloride infusion, , Intravenous, PRN, Anders Grant C, NP   acetaminophen (TYLENOL) tablet 650 mg, 650 mg, Oral, Q6H PRN **OR** acetaminophen (TYLENOL) suppository 650 mg, 650 mg, Rectal, Q6H PRN, Allena Katz, Kishan S, RPH   ceFEPIme (MAXIPIME) 2 g in sodium chloride 0.9 % 100 mL IVPB, 2 g, Intravenous, Q12H, Wouk, Wilfred Curtis, MD, Last Rate: 200 mL/hr at 11/14/22 1235, 2 g at 11/14/22 1235   Chlorhexidine Gluconate Cloth 2 % PADS 6 each, 6 each, Topical, Daily, Charise Killian, MD, 6 each at 11/14/22 0859   gabapentin (NEURONTIN) capsule 200 mg, 200 mg, Oral, BID, Lindajo Royal V, MD, 200 mg at 11/14/22 0858   guaiFENesin (MUCINEX) 12 hr tablet 600 mg, 600 mg, Oral, BID PRN, Charise Killian, MD, 600 mg at 11/13/22 0502   guaiFENesin-dextromethorphan (ROBITUSSIN DM) 100-10 MG/5ML syrup 5 mL, 5 mL, Oral, Q4H PRN, Andris Baumann, MD   insulin aspart (novoLOG) injection 0-5 Units, 0-5 Units, Subcutaneous, QHS, Wouk, Wilfred Curtis, MD   insulin aspart (novoLOG) injection 0-9 Units, 0-9 Units, Subcutaneous, TID WC, Wouk, Wilfred Curtis, MD, 3 Units at 11/14/22 1619   insulin glargine-yfgn (SEMGLEE) injection 15 Units, 15 Units, Subcutaneous, Q2200, Charise Killian, MD, 15 Units at 11/13/22 2245   lactulose (CHRONULAC) 10 GM/15ML solution 20 g, 20 g, Oral, Daily, Wouk, Wilfred Curtis, MD, 20 g at 11/14/22 1455   lidocaine (LIDODERM) 5 % 1 patch, 1 patch, Transdermal, Q24H, Andris Baumann, MD, 1 patch at 11/13/22 2300   loratadine (CLARITIN) tablet 10 mg, 10 mg, Oral, Daily, Mayford Knife, Jamiese M, MD, 10 mg at 11/14/22 0858   metoprolol tartrate (LOPRESSOR) tablet 25 mg, 25 mg, Oral, BID,  Wouk, Wilfred Curtis, MD, 25 mg at 11/14/22 0858   ondansetron (ZOFRAN) tablet 4 mg, 4 mg, Oral, Q6H PRN **OR** ondansetron (ZOFRAN) injection 4 mg, 4 mg, Intravenous, Q6H PRN, Andris Baumann, MD   oxyCODONE (Oxy IR/ROXICODONE) immediate release tablet 5 mg, 5 mg, Oral, Q8H PRN, Lindajo Royal V, MD, 5 mg at 11/11/22 0611   phenol (CHLORASEPTIC) mouth spray 1 spray, 1 spray, Mouth/Throat, PRN, Wouk, Wilfred Curtis, MD   polyethylene glycol (MIRALAX / GLYCOLAX) packet 17 g,  17 g, Oral, Daily, Wouk, Wilfred Curtis, MD, 17 g at 11/14/22 1610   senna (SENOKOT) tablet 8.6 mg, 1 tablet, Oral, Daily, Wouk, Wilfred Curtis, MD, 8.6 mg at 11/14/22 9604  Review of Systems  Constitutional:  Positive for fatigue. Negative for appetite change, chills, fever and unexpected weight change.  HENT:   Negative for hearing loss and voice change.   Eyes:  Negative for eye problems and icterus.  Respiratory:  Negative for chest tightness, cough and shortness of breath.   Cardiovascular:  Negative for chest pain and leg swelling.  Gastrointestinal:  Negative for abdominal distention and abdominal pain.  Endocrine: Negative for hot flashes.  Genitourinary:  Negative for difficulty urinating, dysuria and frequency.   Musculoskeletal:  Positive for back pain. Negative for arthralgias.  Skin:  Negative for itching and rash.  Neurological:  Negative for light-headedness and numbness.  Hematological:  Negative for adenopathy. Does not bruise/bleed easily.  Psychiatric/Behavioral:  Negative for confusion.     PHYSICAL EXAM Vitals:   11/14/22 0854 11/14/22 1301 11/14/22 1542 11/14/22 1609  BP: (!) 116/53 (!) 109/54 (!) 120/53 129/65  Pulse: (!) 105 (!) 107 (!) 111 (!) 110  Resp: 20 19 (!) 25 (!) 22  Temp: 99.1 F (37.3 C) 98.4 F (36.9 C) 98.4 F (36.9 C) 99.7 F (37.6 C)  TempSrc:  Oral  Oral  SpO2: 99% 97% 99% 98%  Weight:      Height:       Physical Exam Constitutional:      General: He is not in acute  distress.    Appearance: He is not diaphoretic.  HENT:     Head: Normocephalic.  Eyes:     General: No scleral icterus. Cardiovascular:     Rate and Rhythm: Normal rate.     Heart sounds: No murmur heard. Pulmonary:     Effort: Pulmonary effort is normal. No respiratory distress.     Breath sounds: No rales.  Abdominal:     General: There is no distension.     Palpations: Abdomen is soft.     Tenderness: There is no abdominal tenderness.  Musculoskeletal:        General: Normal range of motion.     Cervical back: Normal range of motion and neck supple.  Skin:    General: Skin is warm and dry.     Findings: No erythema.  Neurological:     Mental Status: He is alert and oriented to person, place, and time.     Cranial Nerves: No cranial nerve deficit.     Motor: No abnormal muscle tone.     Coordination: Coordination normal.  Psychiatric:        Mood and Affect: Mood and affect normal.       LABORATORY STUDIES    Latest Ref Rng & Units 11/14/2022    5:36 AM 11/13/2022    4:24 AM 11/12/2022    7:06 PM  CBC  WBC 4.0 - 10.5 K/uL 3.5  3.7    Hemoglobin 13.0 - 17.0 g/dL 6.8  7.3  6.9   Hematocrit 39.0 - 52.0 % 21.3  22.0  20.7   Platelets 150 - 400 K/uL 103  114        Latest Ref Rng & Units 11/14/2022    5:36 AM 11/13/2022    4:24 AM 11/12/2022    3:48 PM  CMP  Glucose 70 - 99 mg/dL 98  540    BUN 8 - 23 mg/dL 28  32    Creatinine 0.61 - 1.24 mg/dL 1.61  0.96    Sodium 045 - 145 mmol/L 135  135    Potassium 3.5 - 5.1 mmol/L 4.1  4.1  4.2   Chloride 98 - 111 mmol/L 100  101    CO2 22 - 32 mmol/L 27  25    Calcium 8.9 - 10.3 mg/dL 9.1  9.0       RADIOGRAPHIC STUDIES: I have personally reviewed the radiological images as listed and agreed with the findings in the report. CT ABDOMEN PELVIS W CONTRAST  Result Date: 11/13/2022 CLINICAL DATA:  uti, possible pyelo, continues to spike fevers EXAM: CT ABDOMEN AND PELVIS WITH CONTRAST TECHNIQUE: Multidetector CT imaging of the  abdomen and pelvis was performed using the standard protocol following bolus administration of intravenous contrast. RADIATION DOSE REDUCTION: This exam was performed according to the departmental dose-optimization program which includes automated exposure control, adjustment of the mA and/or kV according to patient size and/or use of iterative reconstruction technique. CONTRAST:  OMNIPAQUE IOHEXOL 300 MG/ML  SOLN COMPARISON:  CT 3 days ago 11/10/2022 FINDINGS: Lower chest: Trace bilateral pleural effusions and basilar atelectasis, right greater than left. Coronary artery calcifications/stents. Hepatobiliary: No suspicious hepatic lesion. Punctate granuloma in the subcapsular right lobe of the liver. Gallbladder is only minimally distended. No calcified gallstone or biliary dilatation. Pancreas: Parenchymal atrophy. No ductal dilatation or inflammation. Spleen: Normal in size without focal abnormality. Adrenals/Urinary Tract: No adrenal nodule. Heterogeneous enhancement of the upper pole of the right kidney persists, suspicious for focal pyelonephritis. There is no perirenal or intrarenal fluid collection. Mild enhancement of the right ureter and left renal collecting system. No renal calculi. Foley catheter decompresses the urinary bladder. There is circumferential bladder wall thickening. Mild residual perinephric fat stranding. Stomach/Bowel: No bowel obstruction or inflammation. Normal appendix. Moderate colonic stool burden. Vascular/Lymphatic: Aortic atherosclerosis. No aneurysm. No bulky abdominopelvic adenopathy. Reproductive: Enlarged prostate gland causing mass effect on the bladder base. Again seen clips in the prostate. Other: Small right greater than left fat containing inguinal hernias. Small fat containing umbilical hernia. No ascites or abdominopelvic collection. There is mild subcutaneous edema. Musculoskeletal: Stable osseous structures. The bones appear under mineralized. Transitional  lumbosacral anatomy with 4 lumbar type vertebra. Stable T7 and upper lumbar minimal compression deformities. No intramuscular collection. IMPRESSION: 1. Heterogeneous enhancement of the upper pole of the right kidney persists, suspicious for focal pyelonephritis. There is no perirenal or intrarenal fluid collection. 2. Mild enhancement of the right ureter and left renal collecting system, suspicious for ascending urinary tract infection. 3. Circumferential bladder wall thickening with Foley catheter in place. 4. Enlarged prostate gland causing mass effect on the bladder base. 5. Trace bilateral pleural effusions and basilar atelectasis, right greater than left. 6. Additional stable chronic findings as described. Aortic Atherosclerosis (ICD10-I70.0). Electronically Signed   By: Narda Rutherford M.D.   On: 11/13/2022 15:42   DG Chest Port 1 View  Result Date: 11/13/2022 CLINICAL DATA:  Cough EXAM: PORTABLE CHEST 1 VIEW COMPARISON:  07/31/2022 FINDINGS: The heart size and mediastinal contours are within normal limits. Low lung volumes with elevation of the right hemidiaphragm. No focal airspace consolidation, pleural effusion, or pneumothorax. Known bony metastatic lesions, better seen on previous PET-CT. IMPRESSION: No active disease. Electronically Signed   By: Duanne Guess D.O.   On: 11/13/2022 13:42   CT ABDOMEN PELVIS W CONTRAST  Result Date: 11/11/2022 CLINICAL DATA:  Acute abdominal pain.  Back pain.  Prostate  cancer. EXAM: CT ABDOMEN AND PELVIS WITH CONTRAST TECHNIQUE: Multidetector CT imaging of the abdomen and pelvis was performed using the standard protocol following bolus administration of intravenous contrast. RADIATION DOSE REDUCTION: This exam was performed according to the departmental dose-optimization program which includes automated exposure control, adjustment of the mA and/or kV according to patient size and/or use of iterative reconstruction technique. CONTRAST:  OMNIPAQUE IOHEXOL  300 MG/ML  SOLN COMPARISON:  CT renal stone 10/13/2022.  PET-CT 10/09/2022. FINDINGS: Lower chest: There is a trace right pleural effusion. Hepatobiliary: No focal liver abnormality is seen. No gallstones, gallbladder wall thickening, or biliary dilatation. Pancreas: Unremarkable. No pancreatic ductal dilatation or surrounding inflammatory changes. Spleen: Normal in size without focal abnormality. Adrenals/Urinary Tract: Foley catheter is seen within bladder. There is marked diffuse bladder wall thickening with mild surrounding inflammation. Small amount of air in the bladder is likely related to Foley catheter placement. There is no hydronephrosis or perinephric fluid. There is some ill-defined patchy areas of hypoattenuation in the superior pole the right kidney which may represent pyelonephritis. There is no urinary tract calculus. The adrenal glands are within normal limits. Stomach/Bowel: Stomach is within normal limits. Appendix appears normal. No evidence of bowel wall thickening, distention, or inflammatory changes. Vascular/Lymphatic: Aortic atherosclerosis. No enlarged abdominal or pelvic lymph nodes. Reproductive: Prostate gland is enlarged. Prostate radiotherapy seeds are present. Other: There is a small fat containing right inguinal hernia. There is a small umbilical hernia containing nondilated bowel. There is no ascites. Small subcutaneous nodules in the anterior right abdominal wall are unchanged. Musculoskeletal: There is mild new compression deformity of the superior endplate of L2. Mild compression deformity of the superior endplate of L1 appears stable. Degenerative changes affect the spine and hips. IMPRESSION: 1. Marked diffuse bladder wall thickening with surrounding inflammation worrisome for cystitis. 2. Patchy areas of hypoattenuation in the superior pole the right kidney may represent pyelonephritis. 3. New acute mild compression deformity of the superior endplate of L2. 4. Trace right  pleural effusion. Aortic Atherosclerosis (ICD10-I70.0). Electronically Signed   By: Darliss Cheney M.D.   On: 11/11/2022 00:02   CT Renal Stone Study  Result Date: 10/13/2022 CLINICAL DATA:  Hematuria.  History of prostate carcinoma. EXAM: CT ABDOMEN AND PELVIS WITHOUT CONTRAST TECHNIQUE: Multidetector CT imaging of the abdomen and pelvis was performed following the standard protocol without IV contrast. RADIATION DOSE REDUCTION: This exam was performed according to the departmental dose-optimization program which includes automated exposure control, adjustment of the mA and/or kV according to patient size and/or use of iterative reconstruction technique. COMPARISON:  09/20/2022.  PET scan on 10/09/2022. FINDINGS: Lower chest: No acute abnormality. Hepatobiliary: No focal liver abnormality is seen. Status post cholecystectomy. No biliary dilatation. Pancreas: Unremarkable. No pancreatic ductal dilatation or surrounding inflammatory changes. Spleen: Normal in size without focal abnormality. Adrenals/Urinary Tract: No adrenal masses. Bilateral hydronephrosis has resolved since the prior CT after decompression of the bladder. The bladder contains a Foley catheter. The bladder demonstrates diffuse circumferential wall thickening as well as some internal higher density material consistent with clot. Stomach/Bowel: Bowel shows no evidence of obstruction, ileus, inflammation or lesion. The appendix is normal. No free intraperitoneal air. Vascular/Lymphatic: No significant vascular findings are present. No enlarged abdominal or pelvic lymph nodes. Reproductive: Stable prostate enlargement. Other: Stable right inguinal hernia containing fat. Musculoskeletal: Bone lesions seen by PET scan are difficult to see by CT. There is suggestion of multiple very subtle lytic lesions in several lumbar vertebral bodies, the most  conspicuous in L4. No pathologic fracture identified. IMPRESSION: 1. Resolution of bilateral hydronephrosis  after decompression of the bladder. The bladder contains a Foley catheter and demonstrates diffuse circumferential wall thickening as well as some internal higher density material consistent with clot. 2. Stable prostate enlargement. 3. Stable right inguinal hernia containing fat. 4. Bone lesions seen by PET scan are difficult to see by CT. There is suggestion of multiple very subtle lytic lesions in several lumbar vertebral bodies, the most conspicuous in L4. No pathologic fracture identified. Electronically Signed   By: Irish Lack M.D.   On: 10/13/2022 17:31   NM PET (PSMA) SKULL TO MID THIGH  Result Date: 10/11/2022 CLINICAL DATA:  Follow-up prostate carcinoma. Assess response to treatment. EXAM: NUCLEAR MEDICINE PET SKULL BASE TO THIGH TECHNIQUE: 9.7 mCi F18 Piflufolastat (Pylarify) was injected intravenously. Full-ring PET imaging was performed from the skull base to thigh after the radiotracer. CT data was obtained and used for attenuation correction and anatomic localization. COMPARISON:  None Available. FINDINGS: NECK No radiotracer avid lymph nodes in the neck. Incidental CT finding: None. CHEST No radiotracer accumulation within mediastinal or hilar lymph nodes. No suspicious pulmonary nodules on the CT scan. Incidental CT finding: None. ABDOMEN/PELVIS Prostate: No focal activity in prostate gland. Foley catheter extends through the urethra. Lymph nodes: No abnormal radiotracer accumulation within pelvic or abdominal nodes. Liver: No evidence of liver metastasis. Incidental CT finding: Thickened urinary bladder wall with Foley catheter in place. SKELETON Multiple radiotracer avid lesions within the axillary appendicular skeleton. These lesions are intense and numerous and essentially occult by CT imaging. Lesions involve the pelvis, sacrum, every vertebral body level, ribs, shoulder girdles, and sternum. Lesions of also involve the proximal femurs and humeri. Example lesion within the sternum with  SUV max equal 33.1 on image 68. Example lesion in the posterior RIGHT iliac bone with SUV max equal 23.7. Multiple lesions in the proximal LEFT and RIGHT femur. Example cluster of lesions in the proximal RIGHT femur with SUV max equal 30 on image 7. There approximately 100 lesions. The number of lesions is increased dramatically from comparison PSMA scan IMPRESSION: 1. Innumerable radiotracer avid skeletal metastasis throughout the axial and appendicular skeleton. The lesions are essentially occult by CT imaging. The number of lesions is increased dramatically from comparison PSMA scan. 2. No evidence of visceral metastasis or nodal metastasis. Electronically Signed   By: Genevive Bi M.D.   On: 10/11/2022 14:50   CT HEAD WO CONTRAST ( )  Result Date: 09/21/2022 CLINICAL DATA:  Altered mental status EXAM: CT HEAD WITHOUT CONTRAST TECHNIQUE: Contiguous axial images were obtained from the base of the skull through the vertex without intravenous contrast. RADIATION DOSE REDUCTION: This exam was performed according to the departmental dose-optimization program which includes automated exposure control, adjustment of the mA and/or kV according to patient size and/or use of iterative reconstruction technique. COMPARISON:  CT head 07/06/15 FINDINGS: Brain: No hemorrhage. No hydrocephalus. No extra-axial fluid collection. No CT evidence of an acute cortical infarct. No mass effect. No mass lesion. There is mild overall chronic microvascular ischemic change. Vascular: No hyperdense vessel or unexpected calcification. Skull: Normal. Negative for fracture or focal lesion. Sinuses/Orbits: No middle ear or mastoid effusion. Paranasal sinuses are clear. Orbits are unremarkable. Other: None. IMPRESSION: No acute intracranial process. No specific etiology for altered mental status identified. Electronically Signed   By: Lorenza Cambridge M.D.   On: 09/21/2022 10:12   CT Abdomen Pelvis Wo Contrast  Result Date:  09/20/2022 CLINICAL DATA:  Symptomatic anemia. Clinical suspicion for retroperitoneal hemorrhage. EXAM: CT ABDOMEN AND PELVIS WITHOUT CONTRAST TECHNIQUE: Multidetector CT imaging of the abdomen and pelvis was performed following the standard protocol without IV contrast. RADIATION DOSE REDUCTION: This exam was performed according to the departmental dose-optimization program which includes automated exposure control, adjustment of the mA and/or kV according to patient size and/or use of iterative reconstruction technique. COMPARISON:  05/13/2020 FINDINGS: Lower chest: No acute findings. Hepatobiliary: No mass visualized on this unenhanced exam. Gallbladder is unremarkable. No evidence of biliary ductal dilatation. Pancreas: No mass or inflammatory process visualized on this unenhanced exam. Spleen:  Within normal limits in size. Adrenals/Urinary tract: No evidence of urolithiasis. No suspicious renal masses identified. Moderate to severe bilateral hydroureteronephrosis is seen to the level of the bladder, which is new since previous study. No ureteral calculi or other obstructing etiology visualized. Distended urinary bladder noted with mild diffuse wall thickening. Stomach/Bowel: No evidence of obstruction, inflammatory process, or abnormal fluid collections. Normal appendix visualized. Moderate colonic stool burden noted. Vascular/Lymphatic: No pathologically enlarged lymph nodes identified. No evidence of abdominal aortic aneurysm. Aortic atherosclerotic calcification incidentally noted. Reproductive: Moderately enlarged prostate gland, without significant change. Other:  None.  No evidence of retroperitoneal hemorrhage. Musculoskeletal:  No suspicious bone lesions identified. IMPRESSION: No evidence of retroperitoneal hemorrhage. New moderate to severe bilateral hydroureteronephrosis to the level of the bladder. No ureteral calculi or other obstructing etiology visualized. This is likely due to vesicoureteral  reflux given distended urinary bladder. Distended urinary bladder with mild diffuse wall thickening, presumably due to chronic bladder outlet obstruction. Moderately enlarged prostate gland, without significant change. Moderate colonic stool burden; recommend correlation for symptoms or signs of constipation. Electronically Signed   By: Danae Orleans M.D.   On: 09/20/2022 12:49     Assessment and plan-   # Recurrent severe anemia requiring multiple units of PRBC transfusion. He has been off chemotherapy for about 2 months, less likely that anemia is due to chemotherapy. Differential diagnosis includes anemia secondary to chronic kidney disease, anemia due to marrow suppression secondary to infection, anemia secondary to underlying intrinsic bone marrow disorders or prostate cancer infiltration of bone marrow. I agree with PRBC transfusion to keep hemoglobin above 7. Recommend bone marrow biopsy.  Rationale and potential side effects were reviewed and discussed with patient and wife.  They agree with the plan.   # Metastatic prostate cancer, progressed on Xtandi, refractory to docetaxel. Pending evaluation for pluvicto therapy.  However his anemia may become a major barrier for him to receive the therapy.  Hopefully bone marrow biopsy will help to narrow down etiology of anemia.  # Urinary retention, status post HOLEP procedure.  His kidney function has normalized. # Acute pyelonephritis, secondary to indwelling Foley catheter.  Continue IV antibiotics # New compression fracture.   pain control. Thank you for allowing me to participate in the care of this patient.   Rickard Patience, MD, PhD Hematology Oncology 11/14/2022

## 2022-11-14 NOTE — TOC Progression Note (Signed)
Transition of Care [facility]) - Progression Note    Patient Details  Name: Keith Howe MRN: [id number] Date of Birth: 09/30/1940  [facility] CM/SW Contact  Truddie Hidden, RN Phone Number: 11/14/2022, 3:59 PM  Clinical Narrative:    TOC assessing for ongoing needs and discharge planning.   Expected Discharge Plan: Home w Home Health Services Barriers to Discharge: Continued Medical Work up  Expected Discharge Plan and Services In-house Referral: Hospice / Palliative Care   Post Acute Care Choice: Home Health Living arrangements for the past 2 months: Single Family Home                                       Social Determinants of Health (SDOH) Interventions SDOH Screenings   Food Insecurity: No Food Insecurity (11/11/2022)  Housing: Low Risk  (11/11/2022)  Transportation Needs: No Transportation Needs (11/11/2022)  Utilities: Not At Risk (11/11/2022)  Alcohol Screen: Low Risk  (05/12/2021)  Depression (PHQ2-9): Low Risk  (08/29/2021)  Financial Resource Strain: Low Risk  (05/12/2021)  Physical Activity: Insufficiently Active (05/12/2021)  Social Connections: Socially Integrated (05/12/2021)  Stress: No Stress Concern Present (05/12/2021)  Tobacco Use: Low Risk  (11/10/2022)    Readmission Risk Interventions    09/25/2022   12:18 PM  Readmission Risk Prevention Plan  Transportation Screening Complete  PCP or Specialist Appt within 3-5 Days Complete  HRI or Home Care Consult Complete  Social Work Consult for Recovery Care Planning/Counseling Complete  Palliative Care Screening Complete  Medication Review Oceanographer) Complete

## 2022-11-14 NOTE — Consult Note (Signed)
Chief Complaint: Patient was seen in consultation today for  Chief Complaint  Patient presents with   Hyperglycemia   Referring Physician(s): Dr. Ashok Pall  Supervising Physician: Pernell Dupre  Patient Status: [facility] - In-pt  History of Present Illness: Keith Howe is a 82 y.o. male with a medical history significant for DM, HTN, metastatic prostate cancer currently with indwelling foley catheter and chronic kidney disease. He was recently hospitalized in May for symptomatic anemia attributed to chemotherapy.   He presented to the ED 11/10/22 with complaints of persistent back pain unrelieved by home treatments. Upon arrival he was hypotensive with hyperglycemia, hypotension and tachycardia. His hemoglobin level was 5 and he has received several units of PRBCs.   Interventional Radiology has been asked to evaluate this patient for an image-guided bone marrow biopsy with aspiration for further work up.   Past Medical History:  Diagnosis Date   BPH (benign prostatic hyperplasia)    Cancer (HCC)    Chronic kidney disease    STAGE 3   Diabetes mellitus without complication (HCC)    HOH (hard of hearing)    AIDS   Hypertension    Inguinal hernia    Pain    CHRONIC LBP   Prostate cancer [facility])     Past Surgical History:  Procedure Laterality Date   BACK SURGERY     Lumbar   CATARACT EXTRACTION W/PHACO Right 07/10/2017   Procedure: CATARACT EXTRACTION PHACO AND INTRAOCULAR LENS PLACEMENT (IOC);  Surgeon: Galen Manila, MD;  Location: [facility] ORS;  Service: Ophthalmology;  Laterality: Right;  [place] 00:29.2 AP% 16.5 CDE 4.83 Fluid Pack Lot # [id number]   COLONOSCOPY     EYE SURGERY Bilateral    cataract extraction   HERNIA REPAIR     INGUINAL   PROSTATE BIOPSY N/A 04/08/2020   Procedure: PROSTATE BIOPSY Addison Bailey;  Surgeon: Orson Ape, MD;  Location: [facility] ORS;  Service: Urology;  Laterality: N/A;   TOTAL KNEE ARTHROPLASTY Left 03/19/2018   Procedure: TOTAL KNEE  ARTHROPLASTY;  Surgeon: Juanell Fairly, MD;  Location: [facility] ORS;  Service: Orthopedics;  Laterality: Left;    Allergies: Patient has no known allergies.  Medications: Prior to Admission medications   Medication Sig Start Date End Date Taking? Authorizing Provider  aspirin EC 81 MG tablet Take 81 mg by mouth daily.   Yes [provider]  cholecalciferol (VITAMIN D) 1000 units tablet Take 1,000 Units by mouth daily.   Yes [provider]  dapagliflozin propanediol (FARXIGA) 10 MG TABS tablet Take 10 mg by mouth daily.   Yes [provider]  Iron-Vitamin C 65-125 MG TABS Take 1 tablet by mouth daily. 08/23/22  Yes Rickard Patience, MD  loratadine (CLARITIN) 10 MG tablet Take 10 mg by mouth daily as needed.   Yes [provider]  losartan (COZAAR) 100 MG tablet Take 100 mg by mouth daily. Hold for BP below 111.   Yes [provider]  lovastatin (MEVACOR) 40 MG tablet TAKE 1 TABLET BY MOUTH EVERY DAY 04/25/22  Yes Masoud, Renda Rolls, MD  metoprolol tartrate (LOPRESSOR) 50 MG tablet TAKE 1 TABLET BY MOUTH EVERY 12 HOURS Patient taking differently: Take 50 mg by mouth 2 (two) times daily. Hold for BP below 111. 11/25/21  Yes Masoud, Javed, MD  NOVOLOG FLEXPEN 100 UNIT/ML FlexPen Inject into the skin. Per sliding scale 01/03/18  Yes [provider]  Omega-3 Fatty Acids (FISH OIL PO) Take 1 capsule by mouth daily.    Yes  [provider]  TRESIBA FLEXTOUCH 200 UNIT/ML SOPN Inject 80 Units into the muscle daily before breakfast.  06/20/17  Yes [provider]  TRULICITY 1.5 MG/0.5ML SOPN Inject 1.5 mg into the skin once a week. 08/10/22  Yes [provider]  vitamin B-12 (CYANOCOBALAMIN) 500 MCG tablet Take 500 mcg by mouth daily.   Yes [provider]  Misc Natural Products (PROSTATE SUPPORT PO) Take 2 capsules by mouth daily.    [provider]  oxyCODONE (OXY IR/ROXICODONE) 5 MG immediate release tablet Take 1 tablet (5  mg total) by mouth every 8 (eight) hours as needed for up to 10 days for severe pain. Patient not taking: Reported on 11/11/2022 11/10/22 11/20/22  Creig Hines, MD     Family History  Problem Relation Age of Onset   Diabetes Father    Breast cancer Sister     Social History   Socioeconomic History   Marital status: Married    Spouse name: Not on file   Number of children: Not on file   Years of education: Not on file   Highest education level: Some college, no degree  Occupational History   Not on file  Tobacco Use   Smoking status: Never    Passive exposure: Never   Smokeless tobacco: Never  Vaping Use   Vaping Use: Never used  Substance and Sexual Activity   Alcohol use: No   Drug use: Never   Sexual activity: Not Currently  Other Topics Concern   Not on file  Social History Narrative   Independent at baseline.  Lives at home with his wife   Social Determinants of Health   Financial Resource Strain: Low Risk  (05/12/2021)   Overall Financial Resource Strain (CARDIA)    Difficulty of Paying Living Expenses: Not hard at all  Food Insecurity: No Food Insecurity (11/11/2022)   Hunger Vital Sign    Worried About Running Out of Food in the Last Year: Never true    Ran Out of Food in the Last Year: Never true  Transportation Needs: No Transportation Needs (11/11/2022)   PRAPARE - Administrator, Civil Service (Medical): No    Lack of Transportation (Non-Medical): No  Physical Activity: Insufficiently Active (05/12/2021)   Exercise Vital Sign    Days of Exercise per Week: 7 days    Minutes of Exercise per Session: 10 min  Stress: No Stress Concern Present (05/12/2021)   [employer] of Occupational Health - Occupational Stress Questionnaire    Feeling of Stress : Not at all  Social Connections: Socially Integrated (05/12/2021)   Social Connection and Isolation Panel [NHANES]    Frequency of Communication with Friends and Family: More than three times a week     Frequency of Social Gatherings with Friends and Family: Once a week    Attends Religious Services: More than 4 times per year    Active Member of [employer] or Organizations: Yes    Attends Engineer, structural: More than 4 times per year    Marital Status: Married    Review of Systems: A 12 point ROS discussed and pertinent positives are indicated in the HPI above.  All other systems are negative.  Review of Systems  Constitutional:  Positive for fatigue.  Respiratory:  Negative for cough and shortness of breath.   Cardiovascular:  Negative for chest pain and leg swelling.  Gastrointestinal:  Negative for abdominal pain, diarrhea, nausea and vomiting.  Musculoskeletal:  Positive  for arthralgias, back pain and myalgias.  Neurological:  Negative for dizziness and headaches.    Vital Signs: BP 139/73 (BP Location: Left Arm)   Pulse (!) 105   Temp 98.3 F (36.8 C) (Oral)   Resp 18   Ht 6\' 2"  (1.88 m)   Wt 224 lb 3.2 oz (101.7 kg)   SpO2 100%   BMI 28.79 kg/m   Physical Exam Constitutional:      General: He is not in acute distress.    Appearance: He is not ill-appearing.  HENT:     Ears:     Comments: Hard of hearing    Mouth/Throat:     Mouth: Mucous membranes are moist.     Pharynx: Oropharynx is clear.  Cardiovascular:     Rate and Rhythm: Regular rhythm. Tachycardia present.  Pulmonary:     Effort: Pulmonary effort is normal.     Breath sounds: Normal breath sounds.  Abdominal:     General: Bowel sounds are normal.     Palpations: Abdomen is soft.     Tenderness: There is no abdominal tenderness.  Musculoskeletal:     Right lower leg: No edema.     Left lower leg: No edema.  Skin:    General: Skin is warm and dry.  Neurological:     Mental Status: He is alert and oriented to person, place, and time.     Imaging: CT ABDOMEN PELVIS W CONTRAST  Result Date: 11/13/2022 CLINICAL DATA:  uti, possible pyelo, continues to spike fevers EXAM: CT ABDOMEN AND  PELVIS WITH CONTRAST TECHNIQUE: Multidetector CT imaging of the abdomen and pelvis was performed using the standard protocol following bolus administration of intravenous contrast. RADIATION DOSE REDUCTION: This exam was performed according to the departmental dose-optimization program which includes automated exposure control, adjustment of the mA and/or kV according to patient size and/or use of iterative reconstruction technique. CONTRAST:  OMNIPAQUE IOHEXOL 300 MG/ML  SOLN COMPARISON:  CT 3 days ago 11/10/2022 FINDINGS: Lower chest: Trace bilateral pleural effusions and basilar atelectasis, right greater than left. Coronary artery calcifications/stents. Hepatobiliary: No suspicious hepatic lesion. Punctate granuloma in the subcapsular right lobe of the liver. Gallbladder is only minimally distended. No calcified gallstone or biliary dilatation. Pancreas: Parenchymal atrophy. No ductal dilatation or inflammation. Spleen: Normal in size without focal abnormality. Adrenals/Urinary Tract: No adrenal nodule. Heterogeneous enhancement of the upper pole of the right kidney persists, suspicious for focal pyelonephritis. There is no perirenal or intrarenal fluid collection. Mild enhancement of the right ureter and left renal collecting system. No renal calculi. Foley catheter decompresses the urinary bladder. There is circumferential bladder wall thickening. Mild residual perinephric fat stranding. Stomach/Bowel: No bowel obstruction or inflammation. Normal appendix. Moderate colonic stool burden. Vascular/Lymphatic: Aortic atherosclerosis. No aneurysm. No bulky abdominopelvic adenopathy. Reproductive: Enlarged prostate gland causing mass effect on the bladder base. Again seen clips in the prostate. Other: Small right greater than left fat containing inguinal hernias. Small fat containing umbilical hernia. No ascites or abdominopelvic collection. There is mild subcutaneous edema. Musculoskeletal: Stable osseous  structures. The bones appear under mineralized. Transitional lumbosacral anatomy with 4 lumbar type vertebra. Stable T7 and upper lumbar minimal compression deformities. No intramuscular collection. IMPRESSION: 1. Heterogeneous enhancement of the upper pole of the right kidney persists, suspicious for focal pyelonephritis. There is no perirenal or intrarenal fluid collection. 2. Mild enhancement of the right ureter and left renal collecting system, suspicious for ascending urinary tract infection. 3. Circumferential bladder wall thickening with  Foley catheter in place. 4. Enlarged prostate gland causing mass effect on the bladder base. 5. Trace bilateral pleural effusions and basilar atelectasis, right greater than left. 6. Additional stable chronic findings as described. Aortic Atherosclerosis (ICD10-I70.0). Electronically Signed   By: Narda Rutherford M.D.   On: 11/13/2022 15:42   DG Chest Port 1 View  Result Date: 11/13/2022 CLINICAL DATA:  Cough EXAM: PORTABLE CHEST 1 VIEW COMPARISON:  07/31/2022 FINDINGS: The heart size and mediastinal contours are within normal limits. Low lung volumes with elevation of the right hemidiaphragm. No focal airspace consolidation, pleural effusion, or pneumothorax. Known bony metastatic lesions, better seen on previous PET-CT. IMPRESSION: No active disease. Electronically Signed   By: Duanne Guess D.O.   On: 11/13/2022 13:42   CT ABDOMEN PELVIS W CONTRAST  Result Date: 11/11/2022 CLINICAL DATA:  Acute abdominal pain.  Back pain.  Prostate cancer. EXAM: CT ABDOMEN AND PELVIS WITH CONTRAST TECHNIQUE: Multidetector CT imaging of the abdomen and pelvis was performed using the standard protocol following bolus administration of intravenous contrast. RADIATION DOSE REDUCTION: This exam was performed according to the departmental dose-optimization program which includes automated exposure control, adjustment of the mA and/or kV according to patient size and/or use of iterative  reconstruction technique. CONTRAST:  OMNIPAQUE IOHEXOL 300 MG/ML  SOLN COMPARISON:  CT renal stone 10/13/2022.  PET-CT 10/09/2022. FINDINGS: Lower chest: There is a trace right pleural effusion. Hepatobiliary: No focal liver abnormality is seen. No gallstones, gallbladder wall thickening, or biliary dilatation. Pancreas: Unremarkable. No pancreatic ductal dilatation or surrounding inflammatory changes. Spleen: Normal in size without focal abnormality. Adrenals/Urinary Tract: Foley catheter is seen within bladder. There is marked diffuse bladder wall thickening with mild surrounding inflammation. Small amount of air in the bladder is likely related to Foley catheter placement. There is no hydronephrosis or perinephric fluid. There is some ill-defined patchy areas of hypoattenuation in the superior pole the right kidney which may represent pyelonephritis. There is no urinary tract calculus. The adrenal glands are within normal limits. Stomach/Bowel: Stomach is within normal limits. Appendix appears normal. No evidence of bowel wall thickening, distention, or inflammatory changes. Vascular/Lymphatic: Aortic atherosclerosis. No enlarged abdominal or pelvic lymph nodes. Reproductive: Prostate gland is enlarged. Prostate radiotherapy seeds are present. Other: There is a small fat containing right inguinal hernia. There is a small umbilical hernia containing nondilated bowel. There is no ascites. Small subcutaneous nodules in the anterior right abdominal wall are unchanged. Musculoskeletal: There is mild new compression deformity of the superior endplate of L2. Mild compression deformity of the superior endplate of L1 appears stable. Degenerative changes affect the spine and hips. IMPRESSION: 1. Marked diffuse bladder wall thickening with surrounding inflammation worrisome for cystitis. 2. Patchy areas of hypoattenuation in the superior pole the right kidney may represent pyelonephritis. 3. New acute mild compression  deformity of the superior endplate of L2. 4. Trace right pleural effusion. Aortic Atherosclerosis (ICD10-I70.0). Electronically Signed   By: Darliss Cheney M.D.   On: 11/11/2022 00:02    Labs:  CBC: Recent Labs    11/12/22 0516 11/12/22 1906 11/13/22 0424 11/14/22 0536 11/14/22 2100 11/15/22 0621  WBC 6.5  --  3.7* 3.5*  --  3.4*  HGB 6.8*   < > 7.3* 6.8* 7.9* 8.3*  HCT 21.2*   < > 22.0* 21.3* 23.8* 24.8*  PLT 112*  --  114* 103*  --  71*   < > = values in this interval not displayed.    COAGS: Recent Labs  07/31/22 1623 09/21/22 1640  INR 1.2 1.4*  APTT 26 28    BMP: Recent Labs    11/10/22 2208 11/12/22 0516 11/12/22 1548 11/13/22 0424 11/14/22 0536  NA 134* 136  --  135 135  K 4.9 5.2* 4.2 4.1 4.1  CL 101 101  --  101 100  CO2 22 26  --  25 27  GLUCOSE 206* 110*  --  100* 98  BUN 40* 37*  --  32* 28*  CALCIUM 8.9 9.3  --  9.0 9.1  CREATININE 1.27* 1.30*  --  1.23 1.22  GFRNONAA 57* 55*  --  59* 60*    LIVER FUNCTION TESTS: Recent Labs    09/22/22 0409 10/04/22 1331 10/13/22 1631 11/01/22 0846  BILITOT 0.6 0.2* 0.3 0.4  AST 20 21 27  60*  ALT 23 20 17 23   ALKPHOS 33* 64 69 67  PROT 5.0* 6.7 6.3* 6.6  ALBUMIN 2.9* 3.1* 2.7* 2.9*    TUMOR MARKERS: No results for input(s): "AFPTM", "CEA", "CA199", "CHROMGRNA" in the last 8760 hours.  Assessment and Plan:  Symptomatic anemia; history of prostate cancer with concern for bone marrow involvement: Keith Howe, 82 year old male, is scheduled today for an image-guided bone marrow biopsy with aspiration. The procedure was discussed at the bedside with the patient and his sister.   Risks and benefits of this procedure were discussed with the patient and/or patient's family including, but not limited to bleeding, infection, damage to adjacent structures or low yield requiring additional tests.  All of the questions were answered and there is agreement to proceed. He has been NPO.   Consent signed  and in chart.  Thank you for this interesting consult.  I greatly enjoyed meeting Keith Howe and look forward to participating in their care.  A copy of this report was sent to the requesting provider on this date.  Electronically Signed: Alwyn Ren, AGACNP-BC [phone] 11/15/2022, 8:10 AM   I spent a total of 20 Minutes    in face to face in clinical consultation, greater than 50% of which was counseling/coordinating care for bone marrow biopsy with aspiration.

## 2022-11-14 NOTE — Care Management Important Message (Signed)
Important Message  Patient Details  Name: Keith Howe MRN: [id number] Date of Birth: 1941/02/09   Medicare Important Message Given:  Yes     Johnell Comings 11/14/2022, 11:11 AM

## 2022-11-14 NOTE — Progress Notes (Addendum)
PROGRESS NOTE   HPI was taken from Dr. Para March: Keith Howe is a 82 y.o. male with medical history significant for DM, HTN, prostate cancer with extensive skeletal metastases s/p RT and on Eligard, currently with indwelling Foley, CKD 3A,  chemotherapy-induced neuropathy, and chronic back pain, with history of hospitalization from 5/16 to 09/26/2022 for symptomatic anemia attributed to chemotherapy, receiving 4 units PRBCs, and who continues on outpatient Retacrit, last seen by his oncologist, Dr. Cathie Hoops on 6/27, who presents to the ED with persistent back pain not responding to outpatient Aleve and Tylenol.  He denies new bowel or bladder dysfunction and denies numbness in the groin and inner thigh area.  Denies weakness in the legs..  He spoke to the the oncology office earlier in the day and they called in a prescription for oxycodone however EMS was called due to unrelieved pain.  Patient was hypotensive with EMS and with a blood sugar of 386.  He received a 500 mL LR bolus and route. ED course and data review: Mildly tachycardic to 110 with soft BP of 103/53 and otherwise normal vitals. Labs most notable for hemoglobin of 5, down from 7.2 about 10 days prior.  Creatinine at baseline at 1.27, glucose elevated at 206.  Urinalysis showed large leukocyte esterase and rare bacteria. EKG, personally viewed and interpreted showing sinus tachycardia with no acute ST-T wave changes. CT abdomen and pelvis with contrast pending Patient given an NS bolus, morphine and Zofran. Hospitalist consulted for admission.      CT abdomen and pelvis resulted after admission showed the following IMPRESSION: 1. Marked diffuse bladder wall thickening with surrounding inflammation worrisome for cystitis. 2. Patchy areas of hypoattenuation in the superior pole the right kidney may represent pyelonephritis. 3. New acute mild compression deformity of the superior endplate of L2. 4. Trace right pleural  effusion.    Keith Howe  ZOX:[id number] DOB: 1940/07/05 DOA: 11/10/2022 PCP: Corky Downs, MD   Assessment & Plan:   Principal Problem:   Symptomatic anemia Active Problems:   Anemia in stage 3 chronic kidney disease (HCC)   Normocytic anemia   Antineoplastic chemotherapy induced anemia   SIRS (systemic inflammatory response syndrome) (HCC)   Hypotension   Essential hypertension   Prostate cancer metastatic to bone (HCC)   Acute exacerbation of chronic low back pain   Uncontrolled type 2 diabetes mellitus with hyperglycemia, with long-term current use of insulin (HCC)   Chronic kidney disease, stage 3a (HCC)   Chemotherapy-induced neuropathy (HCC)   Compression fracture of L2 lumbar vertebra, closed, initial encounter (HCC)   Indwelling Foley catheter present  Assessment and Plan: Symptomatic anemia: likely secondary to bone marrow suppression from chemo. S/p 2 unit sof pRBCs transfused. Hgb this morning down to 6.8, no bleeding. Hx of transfusion of 4 units PRBCs for chemotherapy-induced anemia during hospitalization in May and currently receives Retacrit with oncology. Will transfuse 1 unit and discuss w/ hematology. Hematology is concerned there could be bone marrow involvement from patient's prostate cancer, advises bone marrow biopsy. Will contact IR.     Hypotension: resolved. Keep MAP >65   HTN: have resumed home metop but at lower dose (25 bid instead of 50)   Acute L2 mild compression fracture: likely pathological secondary to metastatic prostate cancer. Oxycodone, morphine prn for pain. Have ordered PT consult who advises home health.  PYelonephritis: likely secondary to chronic foley. Ct on admission with possible pyelo. Serratia and enterobacter on culture from June. Serratia growing this urine  culture. Continued to spike fevers on 7/8. Blood cultures ngtd.  Last seen by urology on 6/7 with the following plan "Tentatively consider HOLEP in 4 to 5 weeks if clinically  stable and able to tolerate surgery... pain medications and a palliative care evaluation". Repeat ct scan on 7/8 given persistent fevers shows ehnancement upper pole of right kidney and around right ureter and left renal collecting system, all suspicious for pyelo. No abscess or signs obstructing stone. Foley exchanged 7/8 - continue cefepime for now (culture sensitive to ceftriaxone but continued to spike fevers on that). Last fever was AM on 7/8   DM2: HbA1c 6.8 in 07/2022, well controlled. Hypoglycemic this morning, will stop semglee and reduce  .  CKDIIIa: stable at baseline  Chemotherapy-induced neuropathy: continue on home dose of gabapentin   Constipation: reports no bm since admission. Continue miralax/senna, will add lactulose      DVT prophylaxis: SCDs Code Status: full  Family Communication: discussed pt's care w/ pt's wife at bedside and answered her questions  Disposition Plan: home w/ home health  Level of care: Progressive Status is: Inpatient Remains inpatient appropriate because: severity of illness       Consultants:  Palliative care   Procedures:   Antimicrobials: ceftriaxone>cefepime   Subjective: No complaints  Objective: Vitals:   11/14/22 0019 11/14/22 0323 11/14/22 0854 11/14/22 1301  BP: (!) 102/55 (!) 101/48 (!) 116/53 (!) 109/54  Pulse: 90 95 (!) 105 (!) 107  Resp: 18 18 20 19   Temp: 98.9 F (37.2 C) 98.3 F (36.8 C) 99.1 F (37.3 C) 98.4 F (36.9 C)  TempSrc: Oral Oral  Oral  SpO2: 100% 99% 99% 97%  Weight:      Height:        Intake/Output Summary (Last 24 hours) at 11/14/2022 1338 Last data filed at 11/14/2022 1300 Gross per 24 hour  Intake 920 ml  Output 3200 ml  Net -2280 ml   Filed Weights   11/10/22 2148 11/11/22 0155  Weight: 101 kg 101.7 kg    Examination:  General exam: appears comfortable  Respiratory system: clear breath sounds b/l  Cardiovascular system: S1/S2+. No clicks or rubs   Gastrointestinal system: abd  is soft, NT, ND & normal bowel sounds  Central nervous system: alert & awake. Moves all extremities  Psychiatry: judgement and insight appears at baseline. Flat mood and affect   Data Reviewed: I have personally reviewed following labs and imaging studies  CBC: Recent Labs  Lab 11/10/22 2208 11/11/22 0214 11/11/22 1532 11/12/22 0516 11/12/22 1906 11/13/22 0424 11/14/22 0536  WBC 10.2  --   --  6.5  --  3.7* 3.5*  HGB 5.6*   < > 6.5* 6.8* 6.9* 7.3* 6.8*  HCT 18.3*   < > 20.6* 21.2* 20.7* 22.0* 21.3*  MCV 87.6  --   --  86.5  --  83.0 85.9  PLT 169  --   --  112*  --  114* 103*   < > = values in this interval not displayed.   Basic Metabolic Panel: Recent Labs  Lab 11/10/22 2208 11/12/22 0516 11/12/22 1548 11/13/22 0424 11/14/22 0536  NA 134* 136  --  135 135  K 4.9 5.2* 4.2 4.1 4.1  CL 101 101  --  101 100  CO2 22 26  --  25 27  GLUCOSE 206* 110*  --  100* 98  BUN 40* 37*  --  32* 28*  CREATININE 1.27* 1.30*  --  1.23  1.22  CALCIUM 8.9 9.3  --  9.0 9.1   GFR: Estimated Creatinine Clearance: 60.5 mL/min (by C-G formula based on SCr of 1.22 mg/dL). Liver Function Tests: No results for input(s): "AST", "ALT", "ALKPHOS", "BILITOT", "PROT", "ALBUMIN" in the last 168 hours. No results for input(s): "LIPASE", "AMYLASE" in the last 168 hours. No results for input(s): "AMMONIA" in the last 168 hours. Coagulation Profile: No results for input(s): "INR", "PROTIME" in the last 168 hours. Cardiac Enzymes: No results for input(s): "CKTOTAL", "CKMB", "CKMBINDEX", "TROPONINI" in the last 168 hours. BNP (last 3 results) No results for input(s): "PROBNP" in the last 8760 hours. HbA1C: No results for input(s): "HGBA1C" in the last 72 hours. CBG: Recent Labs  Lab 11/13/22 2117 11/14/22 0020 11/14/22 0321 11/14/22 0857 11/14/22 1251  GLUCAP 193* 170* 144* 70 195*   Lipid Profile: No results for input(s): "CHOL", "HDL", "LDLCALC", "TRIG", "CHOLHDL", "LDLDIRECT" in the last  72 hours. Thyroid Function Tests: No results for input(s): "TSH", "T4TOTAL", "FREET4", "T3FREE", "THYROIDAB" in the last 72 hours. Anemia Panel: No results for input(s): "VITAMINB12", "FOLATE", "FERRITIN", "TIBC", "IRON", "RETICCTPCT" in the last 72 hours. Sepsis Labs: No results for input(s): "PROCALCITON", "LATICACIDVEN" in the last 168 hours.  Recent Results (from the past 240 hour(s))  Urine Culture     Status: Abnormal   Collection Time: 11/10/22 10:00 PM   Specimen: Urine, Random  Result Value Ref Range Status   Specimen Description   Final    URINE, RANDOM Performed at [facility], [address] [place], [place] [address]    Special Requests   Final    NONE Reflexed from [phone] Performed at [facility], [address] [place], [place] [address]    Culture >=100,000 COLONIES/mL SERRATIA MARCESCENS (A)  Final   Report Status 11/14/2022 FINAL  Final   Organism ID, Bacteria SERRATIA MARCESCENS (A)  Final      Susceptibility   Serratia marcescens - MIC*    CEFEPIME 0.25 SENSITIVE Sensitive     CEFTRIAXONE 1 SENSITIVE Sensitive     CIPROFLOXACIN <=0.25 SENSITIVE Sensitive     GENTAMICIN <=1 SENSITIVE Sensitive     NITROFURANTOIN 256 RESISTANT Resistant     TRIMETH/SULFA <=20 SENSITIVE Sensitive     * >=100,000 COLONIES/mL SERRATIA MARCESCENS  Culture, blood (Routine X 2) w Reflex to ID Panel     Status: None (Preliminary result)   Collection Time: 11/12/22 10:56 AM   Specimen: BLOOD  Result Value Ref Range Status   Specimen Description BLOOD BLOOD LEFT HAND  Final   Special Requests   Final    BOTTLES DRAWN AEROBIC AND ANAEROBIC Blood Culture adequate volume   Culture   Final    NO GROWTH 2 DAYS Performed at [facility], [address], [place], [place] [address]    Report Status PENDING  Incomplete  Culture, blood (Routine X 2) w Reflex to ID Panel     Status: None (Preliminary result)   Collection Time: 11/12/22 10:56 AM    Specimen: BLOOD  Result Value Ref Range Status   Specimen Description BLOOD BLOOD RIGHT HAND  Final   Special Requests   Final    BOTTLES DRAWN AEROBIC AND ANAEROBIC Blood Culture adequate volume   Culture   Final    NO GROWTH 2 DAYS Performed at [facility], [address] [place], [place] [address]    Report Status PENDING  Incomplete  SARS Coronavirus 2 by RT PCR (hospital order, performed in [facility] hospital lab) *cepheid single result test* Anterior Nasal Swab     Status: None   Collection Time: 11/13/22  4:00 PM   Specimen: Anterior Nasal Swab  Result Value Ref Range Status   SARS Coronavirus 2 by RT PCR NEGATIVE NEGATIVE Final    Comment: (NOTE) SARS-CoV-2 target nucleic acids are NOT DETECTED.  The SARS-CoV-2 RNA is generally detectable in upper and lower respiratory specimens during the acute phase of infection. The lowest concentration of SARS-CoV-2 viral copies this assay can detect is 250 copies / mL. A negative result does not preclude SARS-CoV-2 infection and should not be used as the sole basis for treatment or other patient management decisions.  A negative result may occur with improper specimen collection / handling, submission of specimen other than nasopharyngeal swab, presence of viral mutation(s) within the areas targeted by this assay, and inadequate number of viral copies (<250 copies / mL). A negative result must be combined with clinical observations, patient history, and epidemiological information.  Fact Sheet for Patients:   RoadLapTop.co.za  Fact Sheet for Healthcare Providers: http://kim-miller.com/  This test is not yet approved or  cleared by the [place] FDA and has been authorized for detection and/or diagnosis of SARS-CoV-2 by FDA under an Emergency Use Authorization (EUA).  This EUA will remain in effect (meaning this test can be used) for the duration of the COVID-19  declaration under Section 564(b)(1) of the Act, 21 U.S.C. section 360bbb-3(b)(1), unless the authorization is terminated or revoked sooner.  Performed at [facility], [address] [place], [place] [address]   Respiratory (~20 pathogens) panel by PCR     Status: None   Collection Time: 11/13/22  4:00 PM   Specimen: Nasopharyngeal Swab; Respiratory  Result Value Ref Range Status   Adenovirus NOT DETECTED NOT DETECTED Final   Coronavirus 229E NOT DETECTED NOT DETECTED Final    Comment: (NOTE) The Coronavirus on the Respiratory Panel, DOES NOT test for the novel  Coronavirus (2019 nCoV)    Coronavirus HKU1 NOT DETECTED NOT DETECTED Final   Coronavirus NL63 NOT DETECTED NOT DETECTED Final   Coronavirus OC43 NOT DETECTED NOT DETECTED Final   Metapneumovirus NOT DETECTED NOT DETECTED Final   Rhinovirus / Enterovirus NOT DETECTED NOT DETECTED Final   Influenza A NOT DETECTED NOT DETECTED Final   Influenza B NOT DETECTED NOT DETECTED Final   Parainfluenza Virus 1 NOT DETECTED NOT DETECTED Final   Parainfluenza Virus 2 NOT DETECTED NOT DETECTED Final   Parainfluenza Virus 3 NOT DETECTED NOT DETECTED Final   Parainfluenza Virus 4 NOT DETECTED NOT DETECTED Final   Respiratory Syncytial Virus NOT DETECTED NOT DETECTED Final   Bordetella pertussis NOT DETECTED NOT DETECTED Final   Bordetella Parapertussis NOT DETECTED NOT DETECTED Final   Chlamydophila pneumoniae NOT DETECTED NOT DETECTED Final   Mycoplasma pneumoniae NOT DETECTED NOT DETECTED Final    Comment: Performed at [facility], [address] [place], [place] [address]         Radiology Studies: CT ABDOMEN PELVIS W CONTRAST  Result Date: 11/13/2022 CLINICAL DATA:  uti, possible pyelo, continues to spike fevers EXAM: CT ABDOMEN AND PELVIS WITH CONTRAST TECHNIQUE: Multidetector CT imaging of the abdomen and pelvis was performed using the standard protocol following bolus administration of intravenous contrast.  RADIATION DOSE REDUCTION: This exam was performed according to the departmental dose-optimization program which includes automated exposure control, adjustment of the mA and/or kV according to patient size and/or use of  iterative reconstruction technique. CONTRAST:  OMNIPAQUE IOHEXOL 300 MG/ML  SOLN COMPARISON:  CT 3 days ago 11/10/2022 FINDINGS: Lower chest: Trace bilateral pleural effusions and basilar atelectasis, right greater than left. Coronary artery calcifications/stents. Hepatobiliary: No suspicious hepatic lesion. Punctate granuloma in the subcapsular right lobe of the liver. Gallbladder is only minimally distended. No calcified gallstone or biliary dilatation. Pancreas: Parenchymal atrophy. No ductal dilatation or inflammation. Spleen: Normal in size without focal abnormality. Adrenals/Urinary Tract: No adrenal nodule. Heterogeneous enhancement of the upper pole of the right kidney persists, suspicious for focal pyelonephritis. There is no perirenal or intrarenal fluid collection. Mild enhancement of the right ureter and left renal collecting system. No renal calculi. Foley catheter decompresses the urinary bladder. There is circumferential bladder wall thickening. Mild residual perinephric fat stranding. Stomach/Bowel: No bowel obstruction or inflammation. Normal appendix. Moderate colonic stool burden. Vascular/Lymphatic: Aortic atherosclerosis. No aneurysm. No bulky abdominopelvic adenopathy. Reproductive: Enlarged prostate gland causing mass effect on the bladder base. Again seen clips in the prostate. Other: Small right greater than left fat containing inguinal hernias. Small fat containing umbilical hernia. No ascites or abdominopelvic collection. There is mild subcutaneous edema. Musculoskeletal: Stable osseous structures. The bones appear under mineralized. Transitional lumbosacral anatomy with 4 lumbar type vertebra. Stable T7 and upper lumbar minimal compression deformities. No  intramuscular collection. IMPRESSION: 1. Heterogeneous enhancement of the upper pole of the right kidney persists, suspicious for focal pyelonephritis. There is no perirenal or intrarenal fluid collection. 2. Mild enhancement of the right ureter and left renal collecting system, suspicious for ascending urinary tract infection. 3. Circumferential bladder wall thickening with Foley catheter in place. 4. Enlarged prostate gland causing mass effect on the bladder base. 5. Trace bilateral pleural effusions and basilar atelectasis, right greater than left. 6. Additional stable chronic findings as described. Aortic Atherosclerosis (ICD10-I70.0). Electronically Signed   By: Narda Rutherford M.D.   On: 11/13/2022 15:42   DG Chest Port 1 View  Result Date: 11/13/2022 CLINICAL DATA:  Cough EXAM: PORTABLE CHEST 1 VIEW COMPARISON:  07/31/2022 FINDINGS: The heart size and mediastinal contours are within normal limits. Low lung volumes with elevation of the right hemidiaphragm. No focal airspace consolidation, pleural effusion, or pneumothorax. Known bony metastatic lesions, better seen on previous PET-CT. IMPRESSION: No active disease. Electronically Signed   By: Duanne Guess D.O.   On: 11/13/2022 13:42        Scheduled Meds:  Chlorhexidine Gluconate Cloth  6 each Topical Daily   gabapentin  200 mg Oral BID   insulin aspart  0-5 Units Subcutaneous QHS   insulin aspart  0-9 Units Subcutaneous TID WC   insulin glargine-yfgn  15 Units Subcutaneous Q2200   lidocaine  1 patch Transdermal Q24H   loratadine  10 mg Oral Daily   metoprolol tartrate  25 mg Oral BID   polyethylene glycol  17 g Oral Daily   senna  1 tablet Oral Daily   Continuous Infusions:  ceFEPime (MAXIPIME) IV 2 g (11/14/22 1235)     LOS: 3 days    Time spent: 30 mins     Silvano Bilis, MD [facility]  If 7PM-7AM, please contact night-coverage [email or web address] 11/14/2022, 1:38 PM

## 2022-11-15 ENCOUNTER — Inpatient Hospital Stay: Payer: Medicare HMO | Admitting: Radiology

## 2022-11-15 DIAGNOSIS — D649 Anemia, unspecified: Secondary | ICD-10-CM | POA: Diagnosis not present

## 2022-11-15 HISTORY — PX: IR BONE MARROW BIOPSY & ASPIRATION: IMG5727

## 2022-11-15 LAB — GLUCOSE, CAPILLARY
Glucose-Capillary: 104 mg/dL — ABNORMAL HIGH (ref 70–99)
Glucose-Capillary: 84 mg/dL (ref 70–99)
Glucose-Capillary: 198 mg/dL — ABNORMAL HIGH (ref 70–99)
Glucose-Capillary: 211 mg/dL — ABNORMAL HIGH (ref 70–99)

## 2022-11-15 LAB — TYPE AND SCREEN
ABO/RH(D): B POS
Antibody Screen: NEGATIVE
Unit division: 0

## 2022-11-15 LAB — CBC WITH DIFFERENTIAL/PLATELET
Abs Immature Granulocytes: 0.09 10*3/uL — ABNORMAL HIGH (ref 0.00–0.07)
Abs Immature Granulocytes: 0.16 10*3/uL — ABNORMAL HIGH (ref 0.00–0.07)
Basophils Absolute: 0 10*3/uL (ref 0.0–0.1)
Basophils Absolute: 0 10*3/uL (ref 0.0–0.1)
Basophils Relative: 1 %
Basophils Relative: 0 %
Eosinophils Absolute: 0 10*3/uL (ref 0.0–0.5)
Eosinophils Absolute: 0 10*3/uL (ref 0.0–0.5)
Eosinophils Relative: 1 %
Eosinophils Relative: 1 %
HCT: 24.8 % — ABNORMAL LOW (ref 39.0–52.0)
HCT: 23.7 % — ABNORMAL LOW (ref 39.0–52.0)
Hemoglobin: 8.3 g/dL — ABNORMAL LOW (ref 13.0–17.0)
Hemoglobin: 7.8 g/dL — ABNORMAL LOW (ref 13.0–17.0)
Immature Granulocytes: 3 %
Immature Granulocytes: 4 %
Lymphocytes Relative: 7 %
Lymphocytes Relative: 6 %
Lymphs Abs: 0.3 10*3/uL — ABNORMAL LOW (ref 0.7–4.0)
Lymphs Abs: 0.3 10*3/uL — ABNORMAL LOW (ref 0.7–4.0)
MCH: 28.1 pg (ref 26.0–34.0)
MCH: 27.4 pg (ref 26.0–34.0)
MCHC: 33.5 g/dL (ref 30.0–36.0)
MCHC: 32.9 g/dL (ref 30.0–36.0)
MCV: 84.1 fL (ref 80.0–100.0)
MCV: 83.2 fL (ref 80.0–100.0)
Monocytes Absolute: 0.4 10*3/uL (ref 0.1–1.0)
Monocytes Absolute: 0.4 10*3/uL (ref 0.1–1.0)
Monocytes Relative: 10 %
Monocytes Relative: 11 %
Neutro Abs: 2.6 10*3/uL (ref 1.7–7.7)
Neutro Abs: 3.1 10*3/uL (ref 1.7–7.7)
Neutrophils Relative %: 78 %
Neutrophils Relative %: 78 %
Platelets: 71 10*3/uL — ABNORMAL LOW (ref 150–400)
Platelets: 101 10*3/uL — ABNORMAL LOW (ref 150–400)
RBC: 2.95 MIL/uL — ABNORMAL LOW (ref 4.22–5.81)
RBC: 2.85 MIL/uL — ABNORMAL LOW (ref 4.22–5.81)
RDW: 17.4 % — ABNORMAL HIGH (ref 11.5–15.5)
RDW: 17.4 % — ABNORMAL HIGH (ref 11.5–15.5)
Smear Review: NORMAL
Smear Review: NORMAL
WBC: 3.4 10*3/uL — ABNORMAL LOW (ref 4.0–10.5)
WBC: 3.9 10*3/uL — ABNORMAL LOW (ref 4.0–10.5)
nRBC: 2.9 % — ABNORMAL HIGH (ref 0.0–0.2)
nRBC: 3.3 % — ABNORMAL HIGH (ref 0.0–0.2)

## 2022-11-15 LAB — BPAM RBC: ISSUE DATE / TIME: 202407091542

## 2022-11-15 LAB — TSH: TSH: 3.067 u[IU]/mL (ref 0.350–4.500)

## 2022-11-15 MED ORDER — FENTANYL CITRATE (PF) 100 MCG/2ML IJ SOLN
INTRAMUSCULAR | Status: AC
  Filled 2022-11-15: qty 2

## 2022-11-15 MED ORDER — HEPARIN SOD (PORK) LOCK FLUSH 100 UNIT/ML IV SOLN
INTRAVENOUS | Status: AC
  Filled 2022-11-15: qty 5

## 2022-11-15 MED ORDER — MIDAZOLAM HCL 2 MG/2ML IJ SOLN
INTRAMUSCULAR | Status: AC | PRN
  Administered 2022-11-15: 1 mg via INTRAVENOUS

## 2022-11-15 MED ORDER — LIDOCAINE 1 % OPTIME INJ - NO CHARGE
10.0000 mL | Freq: Once | INTRAMUSCULAR | Status: AC
  Administered 2022-11-15: 10 mL via INTRADERMAL
  Filled 2022-11-15: qty 10

## 2022-11-15 MED ORDER — SODIUM CHLORIDE 0.9 % IV SOLN
1.0000 g | INTRAVENOUS | Status: DC
  Filled 2022-11-15: qty 10

## 2022-11-15 MED ORDER — SODIUM CHLORIDE 0.9 % IV SOLN
1.0000 g | INTRAVENOUS | Status: DC
  Administered 2022-11-15: 1 g via INTRAVENOUS
  Filled 2022-11-15 (×2): qty 10

## 2022-11-15 MED ORDER — MIDAZOLAM HCL 2 MG/2ML IJ SOLN
INTRAMUSCULAR | Status: AC
  Filled 2022-11-15: qty 4

## 2022-11-15 MED ORDER — FENTANYL CITRATE (PF) 100 MCG/2ML IJ SOLN
INTRAMUSCULAR | Status: AC | PRN
  Administered 2022-11-15: 25 ug via INTRAVENOUS

## 2022-11-15 MED ORDER — SODIUM CHLORIDE 0.9 % IV SOLN
2.0000 g | Freq: Two times a day (BID) | INTRAVENOUS | Status: DC
  Filled 2022-11-15: qty 12.5

## 2022-11-15 NOTE — Progress Notes (Signed)
Physical Therapy Treatment Patient Details Name: Keith Howe MRN: [id number] DOB: 1941-04-02 Today's Date: 11/15/2022   History of Present Illness Keith Howe is a 82 y.o. male who presents with low back pain, hypotension and hyperglycemia, imaging reveals acute L2 endplate compression fracture, low Hgb, administered blood transfusion. PMH significant of prostate cancer on chemotherapy, hypertension, diabetes mellitus, obesity, BPH, CKD-3B, chronic back pain, hard of hearing, anemia. As of 7/10, s/p bone marrow biopsy of R hip.    PT Comments  Pt presents laying in bed with family in the room, s/p bone marrow biopsy of the R hip performed this morning. He's alert and currently experiencing some soreness in the R hip and discomfort in the low back. Pt required modA for supine>sit to assist with guiding LE, initial trunk stability, and scooting EOB. Overall sitting balance varied from poor to fair, initially requiring MinA to correct forward flexed posture but was able to self correct it after a minute or so with cueing. Pt was able to sit>stand and transfer from bed to recliner with ModAx2 due to weakness and forward flexed posturing. Pt would benefit from continued skilled PT due to current mobility limitations and to maximize functional abilities.     Assistance Recommended at Discharge Intermittent Supervision/Assistance  If plan is discharge home, recommend the following:  Can travel by private vehicle    A lot of help with bathing/dressing/bathroom;A lot of help with walking and/or transfers;Two people to help with bathing/dressing/bathroom;Direct supervision/assist for medications management;Assist for transportation;Assistance with cooking/housework;Help with stairs or ramp for entrance      Equipment Recommendations  None recommended by PT    Recommendations for Other Services       Precautions / Restrictions Precautions Precautions: Fall Precaution Comments: s/p bone  marrow biopsy, R hip was off loaded with pillow upon entering room (7/10) Restrictions Weight Bearing Restrictions: No     Mobility  Bed Mobility Overal bed mobility: Needs Assistance Bed Mobility: Supine to Sit     Supine to sit: Mod assist     General bed mobility comments: ModA to scoot EOB, guide LE, and initial trunk stability.    Transfers Overall transfer level: Needs assistance Equipment used: Rolling walker (2 wheels) Transfers: Sit to/from Stand, Bed to chair/wheelchair/BSC Sit to Stand: +2 physical assistance, Mod assist           General transfer comment: ModA x2 for transfer from bed>recliner to maintain upright posture with repeated cueing for foot/RW placement    Ambulation/Gait                   Stairs             Wheelchair Mobility     Tilt Bed    Modified Rankin (Stroke Patients Only)       Balance Overall balance assessment: Needs assistance Sitting-balance support: Feet supported, Bilateral upper extremity supported Sitting balance-Leahy Scale: Poor Sitting balance - Comments: Fluctuated from poor to fair sitting balance, after a few minutes was able to sit without assistance and B/L UE support Postural control:  (forward flexed posture) Standing balance support: Bilateral upper extremity supported, Reliant on assistive device for balance Standing balance-Leahy Scale: Poor Standing balance comment: Unable to achieve upright posture without repeated verbal and tactile cueing. Able to achieve but occasionally returned to forward flexed posture  Cognition Arousal/Alertness: Awake/alert Behavior During Therapy: WFL for tasks assessed/performed Overall Cognitive Status: Within Functional Limits for tasks assessed                                          Exercises      General Comments        Pertinent Vitals/Pain Pain Assessment Faces Pain Scale: Hurts a little  bit Pain Location: Hip/ Low back Pain Descriptors / Indicators: Discomfort, Sore Pain Intervention(s): Monitored during session    Home Living                          Prior Function            PT Goals (current goals can now be found in the care plan section) Acute Rehab PT Goals Patient Stated Goal: to return to home PT Goal Formulation: With patient Time For Goal Achievement: 11/27/22 Progress towards PT goals: Progressing toward goals    Frequency    Min 1X/week      PT Plan Current plan remains appropriate    Co-evaluation              AM-PAC PT "6 Clicks" Mobility   Outcome Measure  Help needed turning from your back to your side while in a flat bed without using bedrails?: A Lot Help needed moving from lying on your back to sitting on the side of a flat bed without using bedrails?: A Lot Help needed moving to and from a bed to a chair (including a wheelchair)?: A Lot Help needed standing up from a chair using your arms (e.g., wheelchair or bedside chair)?: A Lot Help needed to walk in hospital room?: A Lot Help needed climbing 3-5 steps with a railing? : A Lot 6 Click Score: 12    End of Session Equipment Utilized During Treatment: Gait belt Activity Tolerance: Patient tolerated treatment well Patient left: in chair;with call bell/phone within reach;with chair alarm set;with family/visitor present   PT Visit Diagnosis: Unsteadiness on feet (R26.81);Other abnormalities of gait and mobility (R26.89);Muscle weakness (generalized) (M62.81)     Time: 1610-9604 PT Time Calculation (min) (ACUTE ONLY): 18 min  Charges:                           Blenda Nicely, PT, SPT 3:45 PM,11/15/22

## 2022-11-15 NOTE — Progress Notes (Signed)
PT Cancellation Note  Patient Details Name: Keith Howe MRN: [id number] DOB: 1941-02-20   Cancelled Treatment:    Reason Eval/Treat Not Completed: Other (comment) Pt out of room for procedure, PT to reattempt as able   Blenda Nicely, PT, SPT 8:53 AM,11/15/22

## 2022-11-15 NOTE — Progress Notes (Signed)
[facility]  - [place] at [facility]   PATIENT NAME: Keith Howe    MR#:  [id number]  DATE OF BIRTH:  06-18-1940  SUBJECTIVE:      VITALS:  Blood pressure 121/66, pulse (!) 102, temperature 98.8 F (37.1 C), resp. rate 20, height 6\' 2"  (1.88 m), weight 101.7 kg, SpO2 99 %.  PHYSICAL EXAMINATION:   GENERAL:  82 y.o.-year-old patient with no acute distress.  LUNGS: Normal breath sounds bilaterally, no wheezing CARDIOVASCULAR: S1, S2 normal. No murmur   ABDOMEN: Soft, nontender, nondistended. Bowel sounds present.  EXTREMITIES: No  edema b/l.    NEUROLOGIC: nonfocal  patient is alert and awake SKIN: No obvious rash, lesion, or ulcer.   LABORATORY PANEL:  CBC Recent Labs  Lab 11/15/22 1528  WBC 3.9*  HGB 7.8*  HCT 23.7*  PLT 101*    Chemistries  Recent Labs  Lab 11/14/22 0536  NA 135  K 4.1  CL 100  CO2 27  GLUCOSE 98  BUN 28*  CREATININE 1.22  CALCIUM 9.1   Cardiac Enzymes No results for input(s): "TROPONINI" in the last 168 hours. RADIOLOGY:  IR BONE MARROW BIOPSY & ASPIRATION  Result Date: 11/15/2022 INDICATION: Anemia EXAM: Bone marrow aspiration and core biopsy using fluoroscopic guidance MEDICATIONS: None. ANESTHESIA/SEDATION: Moderate (conscious) sedation was employed during this procedure. A total of Versed 1 mg and Fentanyl 25 mcg was administered intravenously. Moderate Sedation Time: 10 minutes. The patient's level of consciousness and vital signs were monitored continuously by radiology nursing throughout the procedure under my direct supervision. FLUOROSCOPY TIME:  Fluoroscopy Time: 0.5 minutes (8 mGy) COMPLICATIONS: None immediate. PROCEDURE: Informed written consent was obtained from the patient after a thorough discussion of the procedural risks, benefits and alternatives. All questions were addressed. Maximal Sterile Barrier Technique was utilized including caps, mask, sterile gowns, sterile gloves, sterile drape, hand hygiene  and skin antiseptic. A timeout was performed prior to the initiation of the procedure. The patient was placed prone on the exam table. Limited fluoroscopy of the pelvis was performed for planning purposes. Skin entry site was marked, and the overlying skin was prepped and draped in the standard sterile fashion. Local analgesia was obtained with 1% lidocaine. Using fluoroscopic guidance, an 11 gauge needle was advanced just deep to the cortex of the right posterior ilium. Subsequently, bone marrow aspiration and core biopsy were performed. Due to relatively dry tap, a second core biopsy was obtained. Specimens were submitted to lab/pathology for handling. Hemostasis was achieved with manual pressure, and a clean dressing was placed. The patient tolerated the procedure well without immediate complication. IMPRESSION: Successful bone marrow aspiration and core biopsy of the right posterior ilium. Note is made of a relatively dry tap, and an additional core specimen was obtained. Electronically Signed   By: Olive Bass M.D.   On: 11/15/2022 10:30    Assessment and Plan Keith Howe is a 82 y.o. male with medical history significant for DM, HTN, prostate cancer with extensive skeletal metastases s/p RT and on Eligard, currently with indwelling Foley, CKD 3A,  chemotherapy-induced neuropathy, and chronic back pain, with history of hospitalization from 5/16 to 09/26/2022 for symptomatic anemia attributed to chemotherapy, receiving 4 units PRBCs, and who continues on outpatient Retacrit, last seen by his oncologist, Dr. Cathie Hoops on 6/27, who presents to the ED with persistent back pain not responding to outpatient Aleve and Tylenol.    CT abdomen and pelvis resulted after admission showed the following IMPRESSION: 1. Marked  diffuse bladder wall thickening with surrounding inflammation worrisome for cystitis. 2. Patchy areas of hypoattenuation in the superior pole the right kidney may represent pyelonephritis. 3.  New acute mild compression deformity of the superior endplate of L2. 4. Trace right pleural effusion.  Symptomatic anemia: likely secondary to bone marrow suppression from chemo/infection  --last chemo in April -- S/p 2 unit sof pRBCs transfused. Hgb now 8.3 -- Hx of transfusion of 4 units PRBCs for chemotherapy-induced anemia during hospitalization in May and currently receives Retacrit with oncology.  --s/p BM biopsy per IR    Hypotension: resolved. Keep MAP >65    HTN: have resumed home metoprolol but at lower dose (25 bid instead of 50)   Acute L2 mild compression fracture: likely pathological secondary to metastatic prostate cancer. -- Oxycodone, morphine prn for pain.  --Have ordered PT consult who advises home health.   PYelonephritis: likely secondary to chronic foley.  --Ct on admission with possible pyelo. Serratia and enterobacter on culture from June. Serratia growing this urine culture.  --Continued to spike fevers on 7/8. Blood cultures ngtd.   --Last seen by urology on 6/7 with the following plan "Tentatively consider HOLEP in 4 to 5 weeks if clinically stable and able to tolerate surgery... pain medications and a palliative care evaluation".  --Repeat ct scan on 7/8 given persistent fevers shows ehnancement upper pole of right kidney and around right ureter and left renal collecting system, all suspicious for pyelo. No abscess or signs obstructing stone.  --Foley exchanged 7/8 -remains afebrile. Will give IV Rocephin  DM2: HbA1c 6.8 in 07/2022, well controlled.  --cont prest insulin regimen   CKDIIIa: stable at baseline   Chemotherapy-induced neuropathy: continue on home dose of gabapentin    Constipation: reports no bm since admission. Continue miralax/senna, will add lactulose    Procedures: Family communication :wife at bedside Consults : oncology CODE STATUS: full DVT Prophylaxis :SCD (anemia) Level of care: Progressive Status is: Inpatient Remains  inpatient appropriate because: anemia w/u    TOTAL TIME TAKING CARE OF THIS PATIENT: 35 minutes.  >50% time spent on counselling and coordination of care  Note: This dictation was prepared with Dragon dictation along with smaller phrase technology. Any transcriptional errors that result from this process are unintentional.  Enedina Finner M.D    [facility]   CC: Primary care physician; Corky Downs, MD

## 2022-11-16 ENCOUNTER — Other Ambulatory Visit: Payer: Medicare HMO | Admitting: Urology

## 2022-11-16 ENCOUNTER — Telehealth: Payer: Self-pay

## 2022-11-16 ENCOUNTER — Other Ambulatory Visit (HOSPITAL_COMMUNITY): Payer: Medicare HMO

## 2022-11-16 DIAGNOSIS — C61 Malignant neoplasm of prostate: Secondary | ICD-10-CM

## 2022-11-16 DIAGNOSIS — D649 Anemia, unspecified: Secondary | ICD-10-CM | POA: Diagnosis not present

## 2022-11-16 LAB — CBC
HCT: 26.1 % — ABNORMAL LOW (ref 39.0–52.0)
Hemoglobin: 8.5 g/dL — ABNORMAL LOW (ref 13.0–17.0)
MCH: 27.6 pg (ref 26.0–34.0)
MCHC: 32.6 g/dL (ref 30.0–36.0)
MCV: 84.7 fL (ref 80.0–100.0)
Platelets: 113 10*3/uL — ABNORMAL LOW (ref 150–400)
RBC: 3.08 MIL/uL — ABNORMAL LOW (ref 4.22–5.81)
RDW: 17.5 % — ABNORMAL HIGH (ref 11.5–15.5)
WBC: 3.4 10*3/uL — ABNORMAL LOW (ref 4.0–10.5)
nRBC: 3.2 % — ABNORMAL HIGH (ref 0.0–0.2)

## 2022-11-16 LAB — GLUCOSE, CAPILLARY
Glucose-Capillary: 188 mg/dL — ABNORMAL HIGH (ref 70–99)
Glucose-Capillary: 197 mg/dL — ABNORMAL HIGH (ref 70–99)

## 2022-11-16 MED ORDER — VITAMIN D 25 MCG (1000 UNIT) PO TABS
1000.0000 [IU] | ORAL_TABLET | Freq: Every day | ORAL | Status: DC
  Administered 2022-11-16: 1000 [IU] via ORAL
  Filled 2022-11-16: qty 1

## 2022-11-16 MED ORDER — VITAMIN B-12 1000 MCG PO TABS
500.0000 ug | ORAL_TABLET | Freq: Every day | ORAL | Status: DC
  Administered 2022-11-16: 500 ug via ORAL
  Filled 2022-11-16: qty 1

## 2022-11-16 MED ORDER — OMEGA-3-ACID ETHYL ESTERS 1 G PO CAPS
1.0000 g | ORAL_CAPSULE | Freq: Every day | ORAL | Status: DC
  Administered 2022-11-16: 1 g via ORAL
  Filled 2022-11-16: qty 1

## 2022-11-16 MED ORDER — BISACODYL 10 MG RE SUPP
10.0000 mg | Freq: Once | RECTAL | Status: AC
  Administered 2022-11-16: 10 mg via RECTAL
  Filled 2022-11-16: qty 1

## 2022-11-16 MED ORDER — FERROUS SULFATE 325 (65 FE) MG PO TABS
325.0000 mg | ORAL_TABLET | Freq: Every day | ORAL | Status: DC
  Administered 2022-11-16: 325 mg via ORAL
  Filled 2022-11-16: qty 1

## 2022-11-16 MED ORDER — SENNA 8.6 MG PO TABS: 1.0000 | ORAL_TABLET | Freq: Every day | ORAL | 0 refills | Status: DC

## 2022-11-16 MED ORDER — CIPROFLOXACIN HCL 500 MG PO TABS: 500.0000 mg | ORAL_TABLET | Freq: Two times a day (BID) | ORAL | 0 refills | Status: DC

## 2022-11-16 MED ORDER — DAPAGLIFLOZIN PROPANEDIOL 10 MG PO TABS
10.0000 mg | ORAL_TABLET | Freq: Every day | ORAL | Status: DC
  Administered 2022-11-16: 10 mg via ORAL
  Filled 2022-11-16: qty 1

## 2022-11-16 MED ORDER — ASPIRIN 81 MG PO TBEC
81.0000 mg | DELAYED_RELEASE_TABLET | Freq: Every day | ORAL | Status: DC
  Administered 2022-11-16: 81 mg via ORAL
  Filled 2022-11-16: qty 1

## 2022-11-16 MED ORDER — METOPROLOL TARTRATE 50 MG PO TABS
50.0000 mg | ORAL_TABLET | Freq: Two times a day (BID) | ORAL | Status: DC
  Administered 2022-11-16: 50 mg via ORAL
  Filled 2022-11-16: qty 1

## 2022-11-16 MED ORDER — LOSARTAN POTASSIUM 50 MG PO TABS
50.0000 mg | ORAL_TABLET | Freq: Every day | ORAL | Status: DC
  Administered 2022-11-16: 50 mg via ORAL
  Filled 2022-11-16: qty 1

## 2022-11-16 MED ORDER — CIPROFLOXACIN HCL 500 MG PO TABS
500.0000 mg | ORAL_TABLET | Freq: Two times a day (BID) | ORAL | Status: DC
  Administered 2022-11-16: 500 mg via ORAL
  Filled 2022-11-16 (×2): qty 1

## 2022-11-16 MED ORDER — TRESIBA FLEXTOUCH 200 UNIT/ML ~~LOC~~ SOPN: 25.0000 [IU] | PEN_INJECTOR | Freq: Every day | SUBCUTANEOUS | 2 refills | Status: DC

## 2022-11-16 MED ORDER — OXYCODONE HCL 5 MG PO TABS: 5.0000 mg | ORAL_TABLET | Freq: Three times a day (TID) | ORAL | 0 refills | Status: DC | PRN

## 2022-11-16 NOTE — Telephone Encounter (Signed)
Thanks. Please add poss retacrit inj to 7/15

## 2022-11-16 NOTE — Plan of Care (Signed)
  Problem: Education: Goal: Knowledge of General Education information will improve Description: Including pain rating scale, medication(s)/side effects and non-pharmacologic comfort measures Outcome: Progressing   Problem: Health Behavior/Discharge Planning: Goal: Ability to manage health-related needs will improve Outcome: Progressing   Problem: Clinical Measurements: Goal: Will remain free from infection Outcome: Progressing Goal: Diagnostic test results will improve Outcome: Progressing Goal: Cardiovascular complication will be avoided Outcome: Progressing   

## 2022-11-16 NOTE — Telephone Encounter (Signed)
Per [facility] message from Dr. Cathie Hoops:   Pt will be dicharged today. please arrange him for the following appts :  lab (cbc, hold tube)/ poss retacrit on 7/15 PRBC on 7/16  lab/MD around 7/18 (cbc, hold tube) PRBC on 7/19

## 2022-11-16 NOTE — Discharge Summary (Signed)
Physician Discharge Summary   Patient: Keith Howe MRN: [id number] DOB: 24-Mar-1941  Admit date:     11/10/2022  Discharge date: 11/16/22  Discharge Physician: Enedina Finner   PCP: Corky Downs, MD   Recommendations at discharge:   follow-up Dr. Cathie Hoops at the cancer center on your upcoming appointment and of July and discussed bone marrow biopsy result follow-up PCP in 1 to 2 weeks  Discharge Diagnoses: Principal Problem:   Symptomatic anemia Active Problems:   Anemia in stage 3 chronic kidney disease (HCC)   Normocytic anemia   Antineoplastic chemotherapy induced anemia   SIRS (systemic inflammatory response syndrome) (HCC)   Hypotension   Essential hypertension   Prostate cancer metastatic to bone (HCC)   Acute exacerbation of chronic low back pain   Uncontrolled type 2 diabetes mellitus with hyperglycemia, with long-term current use of insulin (HCC)   Chronic kidney disease, stage 3a (HCC)   Chemotherapy-induced neuropathy (HCC)   Compression fracture of L2 lumbar vertebra, closed, initial encounter (HCC)   Indwelling Foley catheter present  Keith Howe is a 82 y.o. male with medical history significant for DM, HTN, prostate cancer with extensive skeletal metastases s/p RT and on Eligard, currently with indwelling Foley, CKD 3A,  chemotherapy-induced neuropathy, and chronic back pain, with history of hospitalization from 5/16 to 09/26/2022 for symptomatic anemia attributed to chemotherapy, receiving 4 units PRBCs, and who continues on outpatient Retacrit, last seen by his oncologist, Dr. Cathie Hoops on 6/27, who presents to the ED with persistent back pain not responding to outpatient Aleve and Tylenol.     CT abdomen and pelvis resulted after admission showed the following IMPRESSION: 1. Marked diffuse bladder wall thickening with surrounding inflammation worrisome for cystitis. 2. Patchy areas of hypoattenuation in the superior pole the right kidney may represent  pyelonephritis. 3. New acute mild compression deformity of the superior endplate of L2. 4. Trace right pleural effusion.   Symptomatic anemia: likely secondary to bone marrow suppression from chemo/infection  --last chemo in April -- S/p 2 unit sof pRBCs transfused. Hgb now 8.3 -- Hx of transfusion of 4 units PRBCs for chemotherapy-induced anemia during hospitalization in May and currently receives Retacrit with oncology.  --s/p BM biopsy per IR --7/11--hgb 8.5-- with Dr. Cathie Hoops agreeable for discharge and follow-up [place] biopsy results as outpatient and end of July    Hypotension: resolved. Keep MAP >65    HTN: have resumed home metoprolol but at lower dose (25 bid instead of 50)   Acute L2 mild compression fracture: likely pathological secondary to metastatic prostate cancer. -- Oxycodone, morphine prn for pain.  --Have ordered PT consult who advises home health.   PYelonephritis: likely secondary to chronic foley.  --Ct on admission with possible pyelo. Serratia and enterobacter on culture from June. Serratia growing this urine culture.  --Continued to spike fevers on 7/8. Blood cultures ngtd.   --Last seen by urology on 6/7 with the following plan "Tentatively consider HOLEP in 4 to 5 weeks if clinically stable and able to tolerate surgery... pain medications and a palliative care evaluation".  --Repeat ct scan on 7/8 given persistent fevers shows ehnancement upper pole of right kidney and around right ureter and left renal collecting system, all suspicious for pyelo. No abscess or signs obstructing stone.  --Foley exchanged 7/8 -remains afebrile. Will give IV Rocephin-- change to PO Cipro for sensitivity results will treat for two weeks total -- patient will follow-up with urology Dr.Sninsky shown for further workup regarding Foley catheter  DM2: HbA1c 6.8 in 07/2022, well controlled.  --decreased dose fo Tresiba to 25 uits every day and continued rest of home meds. PCP to adjust  dosing as out pt   CKDIIIa: stable at baseline   Chemotherapy-induced neuropathy: continue on home dose of gabapentin    Constipation: reports no bm since admission. Continue miralax/senna, will add lactulose --7/11--pt had BM today  Patient overall slowly improving. Remains mildly tachycardic although did not have any chest pain or palpitation. Discussed discharge plan with patient's wife and patient's sister at bedside. There are in agreement and okay with patient going home with home health and follow-up with PCP , cancer center and urology as outpatient       Procedures: Family communication :wife at bedside Consults : oncology CODE STATUS: full DVT Prophylaxis :SCD (anemia)     Pain control - [place] Controlled Substance Reporting System database was reviewed. and patient was instructed, not to drive, operate heavy machinery, perform activities at heights, swimming or participation in water activities or provide baby-sitting services while on Pain, Sleep and Anxiety Medications; until their outpatient Physician has advised to do so again. Also recommended to not to take more than prescribed Pain, Sleep and Anxiety Medications.   Disposition: Home health Diet recommendation:  Discharge Diet Orders (From admission, onward)     Start     Ordered   11/16/22 0000  Diet - low sodium heart healthy        11/16/22 1419           Cardiac and Carb modified diet DISCHARGE MEDICATION: Allergies as of 11/16/2022   No Known Allergies      Medication List     TAKE these medications    aspirin EC 81 MG tablet Take 81 mg by mouth daily.   cholecalciferol 1000 units tablet Commonly known as: VITAMIN D Take 1,000 Units by mouth daily.   ciprofloxacin 500 MG tablet Commonly known as: CIPRO Take 1 tablet (500 mg total) by mouth 2 (two) times daily for 20 doses.   cyanocobalamin 500 MCG tablet Commonly known as: VITAMIN B12 Take 500 mcg by mouth daily.   Farxiga 10  MG Tabs tablet Generic drug: dapagliflozin propanediol Take 10 mg by mouth daily.   FISH OIL PO Take 1 capsule by mouth daily.   Iron-Vitamin C 65-125 MG Tabs Take 1 tablet by mouth daily.   loratadine 10 MG tablet Commonly known as: CLARITIN Take 10 mg by mouth daily as needed.   losartan 100 MG tablet Commonly known as: COZAAR Take 100 mg by mouth daily. Hold for BP below 111.   lovastatin 40 MG tablet Commonly known as: MEVACOR TAKE 1 TABLET BY MOUTH EVERY DAY   metoprolol tartrate 50 MG tablet Commonly known as: LOPRESSOR TAKE 1 TABLET BY MOUTH EVERY 12 HOURS What changed:  when to take this additional instructions   NovoLOG FlexPen 100 UNIT/ML FlexPen Generic drug: insulin aspart Inject into the skin. Per sliding scale   oxyCODONE 5 MG immediate release tablet Commonly known as: Oxy IR/ROXICODONE Take 1 tablet (5 mg total) by mouth every 8 (eight) hours as needed for severe pain.   PROSTATE SUPPORT PO Take 2 capsules by mouth daily.   senna 8.6 MG Tabs tablet Commonly known as: SENOKOT Take 1 tablet (8.6 mg total) by mouth daily. Start taking on: November 17, 2022   Evaristo Bury FlexTouch 200 UNIT/ML FlexTouch Pen Generic drug: insulin degludec Inject 26 Units into the skin daily before breakfast. What  changed:  how much to take how to take this   Trulicity 1.5 MG/0.5ML Sopn Generic drug: Dulaglutide Inject 1.5 mg into the skin once a week.               Discharge Care Instructions  (From admission, onward)           Start     Ordered   11/16/22 0000  Discharge wound care:       Comments: 7    Monitor biopsy site every 15 minutes x 4, every 30 minutes x 2, then per unit routine  Once       11/15/22 1106   11/16/22 1419            Follow-up Information     Corky Downs, MD. Schedule an appointment as soon as possible for a visit in 1 week(s).   Specialties: Internal Medicine, Cardiology Contact information: [address] [place]  [place] [address] [phone]         Rickard Patience, MD. Go to.   Specialty: Oncology Why: Your upcoming appointment in end of July Contact information: [address] [place] [address] [phone]                Discharge Exam: Ceasar Mons Weights   11/10/22 2148 11/11/22 0155 11/15/22 0848  Weight: 101 kg 101.7 kg 101.7 kg     Condition at discharge: fair  The results of significant diagnostics from this hospitalization (including imaging, microbiology, ancillary and laboratory) are listed below for reference.   Imaging Studies: IR BONE MARROW BIOPSY & ASPIRATION  Result Date: 11/15/2022 INDICATION: Anemia EXAM: Bone marrow aspiration and core biopsy using fluoroscopic guidance MEDICATIONS: None. ANESTHESIA/SEDATION: Moderate (conscious) sedation was employed during this procedure. A total of Versed 1 mg and Fentanyl 25 mcg was administered intravenously. Moderate Sedation Time: 10 minutes. The patient's level of consciousness and vital signs were monitored continuously by radiology nursing throughout the procedure under my direct supervision. FLUOROSCOPY TIME:  Fluoroscopy Time: 0.5 minutes (8 mGy) COMPLICATIONS: None immediate. PROCEDURE: Informed written consent was obtained from the patient after a thorough discussion of the procedural risks, benefits and alternatives. All questions were addressed. Maximal Sterile Barrier Technique was utilized including caps, mask, sterile gowns, sterile gloves, sterile drape, hand hygiene and skin antiseptic. A timeout was performed prior to the initiation of the procedure. The patient was placed prone on the exam table. Limited fluoroscopy of the pelvis was performed for planning purposes. Skin entry site was marked, and the overlying skin was prepped and draped in the standard sterile fashion. Local analgesia was obtained with 1% lidocaine. Using fluoroscopic guidance, an 11 gauge needle was advanced just deep to the cortex of the right  posterior ilium. Subsequently, bone marrow aspiration and core biopsy were performed. Due to relatively dry tap, a second core biopsy was obtained. Specimens were submitted to lab/pathology for handling. Hemostasis was achieved with manual pressure, and a clean dressing was placed. The patient tolerated the procedure well without immediate complication. IMPRESSION: Successful bone marrow aspiration and core biopsy of the right posterior ilium. Note is made of a relatively dry tap, and an additional core specimen was obtained. Electronically Signed   By: Olive Bass M.D.   On: 11/15/2022 10:30   CT ABDOMEN PELVIS W CONTRAST  Result Date: 11/13/2022 CLINICAL DATA:  uti, possible pyelo, continues to spike fevers EXAM: CT ABDOMEN AND PELVIS WITH CONTRAST TECHNIQUE: Multidetector CT imaging of the abdomen and pelvis was performed using the  standard protocol following bolus administration of intravenous contrast. RADIATION DOSE REDUCTION: This exam was performed according to the departmental dose-optimization program which includes automated exposure control, adjustment of the mA and/or kV according to patient size and/or use of iterative reconstruction technique. CONTRAST:  OMNIPAQUE IOHEXOL 300 MG/ML  SOLN COMPARISON:  CT 3 days ago 11/10/2022 FINDINGS: Lower chest: Trace bilateral pleural effusions and basilar atelectasis, right greater than left. Coronary artery calcifications/stents. Hepatobiliary: No suspicious hepatic lesion. Punctate granuloma in the subcapsular right lobe of the liver. Gallbladder is only minimally distended. No calcified gallstone or biliary dilatation. Pancreas: Parenchymal atrophy. No ductal dilatation or inflammation. Spleen: Normal in size without focal abnormality. Adrenals/Urinary Tract: No adrenal nodule. Heterogeneous enhancement of the upper pole of the right kidney persists, suspicious for focal pyelonephritis. There is no perirenal or intrarenal fluid collection. Mild  enhancement of the right ureter and left renal collecting system. No renal calculi. Foley catheter decompresses the urinary bladder. There is circumferential bladder wall thickening. Mild residual perinephric fat stranding. Stomach/Bowel: No bowel obstruction or inflammation. Normal appendix. Moderate colonic stool burden. Vascular/Lymphatic: Aortic atherosclerosis. No aneurysm. No bulky abdominopelvic adenopathy. Reproductive: Enlarged prostate gland causing mass effect on the bladder base. Again seen clips in the prostate. Other: Small right greater than left fat containing inguinal hernias. Small fat containing umbilical hernia. No ascites or abdominopelvic collection. There is mild subcutaneous edema. Musculoskeletal: Stable osseous structures. The bones appear under mineralized. Transitional lumbosacral anatomy with 4 lumbar type vertebra. Stable T7 and upper lumbar minimal compression deformities. No intramuscular collection. IMPRESSION: 1. Heterogeneous enhancement of the upper pole of the right kidney persists, suspicious for focal pyelonephritis. There is no perirenal or intrarenal fluid collection. 2. Mild enhancement of the right ureter and left renal collecting system, suspicious for ascending urinary tract infection. 3. Circumferential bladder wall thickening with Foley catheter in place. 4. Enlarged prostate gland causing mass effect on the bladder base. 5. Trace bilateral pleural effusions and basilar atelectasis, right greater than left. 6. Additional stable chronic findings as described. Aortic Atherosclerosis (ICD10-I70.0). Electronically Signed   By: Narda Rutherford M.D.   On: 11/13/2022 15:42   DG Chest Port 1 View  Result Date: 11/13/2022 CLINICAL DATA:  Cough EXAM: PORTABLE CHEST 1 VIEW COMPARISON:  07/31/2022 FINDINGS: The heart size and mediastinal contours are within normal limits. Low lung volumes with elevation of the right hemidiaphragm. No focal airspace consolidation, pleural  effusion, or pneumothorax. Known bony metastatic lesions, better seen on previous PET-CT. IMPRESSION: No active disease. Electronically Signed   By: Duanne Guess D.O.   On: 11/13/2022 13:42   CT ABDOMEN PELVIS W CONTRAST  Result Date: 11/11/2022 CLINICAL DATA:  Acute abdominal pain.  Back pain.  Prostate cancer. EXAM: CT ABDOMEN AND PELVIS WITH CONTRAST TECHNIQUE: Multidetector CT imaging of the abdomen and pelvis was performed using the standard protocol following bolus administration of intravenous contrast. RADIATION DOSE REDUCTION: This exam was performed according to the departmental dose-optimization program which includes automated exposure control, adjustment of the mA and/or kV according to patient size and/or use of iterative reconstruction technique. CONTRAST:  OMNIPAQUE IOHEXOL 300 MG/ML  SOLN COMPARISON:  CT renal stone 10/13/2022.  PET-CT 10/09/2022. FINDINGS: Lower chest: There is a trace right pleural effusion. Hepatobiliary: No focal liver abnormality is seen. No gallstones, gallbladder wall thickening, or biliary dilatation. Pancreas: Unremarkable. No pancreatic ductal dilatation or surrounding inflammatory changes. Spleen: Normal in size without focal abnormality. Adrenals/Urinary Tract: Foley catheter is seen within bladder. There  is marked diffuse bladder wall thickening with mild surrounding inflammation. Small amount of air in the bladder is likely related to Foley catheter placement. There is no hydronephrosis or perinephric fluid. There is some ill-defined patchy areas of hypoattenuation in the superior pole the right kidney which may represent pyelonephritis. There is no urinary tract calculus. The adrenal glands are within normal limits. Stomach/Bowel: Stomach is within normal limits. Appendix appears normal. No evidence of bowel wall thickening, distention, or inflammatory changes. Vascular/Lymphatic: Aortic atherosclerosis. No enlarged abdominal or pelvic lymph nodes.  Reproductive: Prostate gland is enlarged. Prostate radiotherapy seeds are present. Other: There is a small fat containing right inguinal hernia. There is a small umbilical hernia containing nondilated bowel. There is no ascites. Small subcutaneous nodules in the anterior right abdominal wall are unchanged. Musculoskeletal: There is mild new compression deformity of the superior endplate of L2. Mild compression deformity of the superior endplate of L1 appears stable. Degenerative changes affect the spine and hips. IMPRESSION: 1. Marked diffuse bladder wall thickening with surrounding inflammation worrisome for cystitis. 2. Patchy areas of hypoattenuation in the superior pole the right kidney may represent pyelonephritis. 3. New acute mild compression deformity of the superior endplate of L2. 4. Trace right pleural effusion. Aortic Atherosclerosis (ICD10-I70.0). Electronically Signed   By: Darliss Cheney M.D.   On: 11/11/2022 00:02    Microbiology: Results for orders placed or performed during the hospital encounter of 11/10/22  Urine Culture     Status: Abnormal   Collection Time: 11/10/22 10:00 PM   Specimen: Urine, Random  Result Value Ref Range Status   Specimen Description   Final    URINE, RANDOM Performed at [facility], [address] [place], [place] [address]    Special Requests   Final    NONE Reflexed from [phone] [id number] Performed at [facility], [address] [place], [place] [address]    Culture >=100,000 COLONIES/mL SERRATIA MARCESCENS (A)  Final   Report Status 11/14/2022 FINAL  Final   Organism ID, Bacteria SERRATIA MARCESCENS (A)  Final      Susceptibility   Serratia marcescens - MIC*    CEFEPIME 0.25 SENSITIVE Sensitive     CEFTRIAXONE 1 SENSITIVE Sensitive     CIPROFLOXACIN <=0.25 SENSITIVE Sensitive     GENTAMICIN <=1 SENSITIVE Sensitive     NITROFURANTOIN 256 RESISTANT Resistant     TRIMETH/SULFA <=20 SENSITIVE Sensitive     * >=100,000 COLONIES/mL  SERRATIA MARCESCENS  Culture, blood (Routine X 2) w Reflex to ID Panel     Status: None (Preliminary result)   Collection Time: 11/12/22 10:56 AM   Specimen: BLOOD  Result Value Ref Range Status   Specimen Description BLOOD BLOOD LEFT HAND  Final   Special Requests   Final    BOTTLES DRAWN AEROBIC AND ANAEROBIC Blood Culture adequate volume   Culture   Final    NO GROWTH 4 DAYS Performed at [facility], [address] [place], [place] [address]    Report Status PENDING  Incomplete  Culture, blood (Routine X 2) w Reflex to ID Panel     Status: None (Preliminary result)   Collection Time: 11/12/22 10:56 AM   Specimen: BLOOD  Result Value Ref Range Status   Specimen Description BLOOD BLOOD RIGHT HAND  Final   Special Requests   Final    BOTTLES DRAWN AEROBIC AND ANAEROBIC Blood Culture adequate volume   Culture   Final    NO GROWTH 4 DAYS Performed at  [facility], [address], [place], [place] [address]    Report Status PENDING  Incomplete  SARS Coronavirus 2 by RT PCR (hospital order, performed in [facility] hospital lab) *cepheid single result test* Anterior Nasal Swab     Status: None   Collection Time: 11/13/22  4:00 PM   Specimen: Anterior Nasal Swab  Result Value Ref Range Status   SARS Coronavirus 2 by RT PCR NEGATIVE NEGATIVE Final    Comment: (NOTE) SARS-CoV-2 target nucleic acids are NOT DETECTED.  The SARS-CoV-2 RNA is generally detectable in upper and lower respiratory specimens during the acute phase of infection. The lowest concentration of SARS-CoV-2 viral copies this assay can detect is 250 copies / mL. A negative result does not preclude SARS-CoV-2 infection and should not be used as the sole basis for treatment or other patient management decisions.  A negative result may occur with improper specimen collection / handling, submission of specimen other than nasopharyngeal swab, presence of viral mutation(s) within the areas targeted by  this assay, and inadequate number of viral copies (<250 copies / mL). A negative result must be combined with clinical observations, patient history, and epidemiological information.  Fact Sheet for Patients:   RoadLapTop.co.za  Fact Sheet for Healthcare Providers: http://kim-miller.com/  This test is not yet approved or  cleared by the [place] FDA and has been authorized for detection and/or diagnosis of SARS-CoV-2 by FDA under an Emergency Use Authorization (EUA).  This EUA will remain in effect (meaning this test can be used) for the duration of the COVID-19 declaration under Section 564(b)(1) of the Act, 21 U.S.C. section 360bbb-3(b)(1), unless the authorization is terminated or revoked sooner.  Performed at [facility], [address] [place], [place] [address]   Respiratory (~20 pathogens) panel by PCR     Status: None   Collection Time: 11/13/22  4:00 PM   Specimen: Nasopharyngeal Swab; Respiratory  Result Value Ref Range Status   Adenovirus NOT DETECTED NOT DETECTED Final   Coronavirus 229E NOT DETECTED NOT DETECTED Final    Comment: (NOTE) The Coronavirus on the Respiratory Panel, DOES NOT test for the novel  Coronavirus (2019 nCoV)    Coronavirus HKU1 NOT DETECTED NOT DETECTED Final   Coronavirus NL63 NOT DETECTED NOT DETECTED Final   Coronavirus OC43 NOT DETECTED NOT DETECTED Final   Metapneumovirus NOT DETECTED NOT DETECTED Final   Rhinovirus / Enterovirus NOT DETECTED NOT DETECTED Final   Influenza A NOT DETECTED NOT DETECTED Final   Influenza B NOT DETECTED NOT DETECTED Final   Parainfluenza Virus 1 NOT DETECTED NOT DETECTED Final   Parainfluenza Virus 2 NOT DETECTED NOT DETECTED Final   Parainfluenza Virus 3 NOT DETECTED NOT DETECTED Final   Parainfluenza Virus 4 NOT DETECTED NOT DETECTED Final   Respiratory Syncytial Virus NOT DETECTED NOT DETECTED Final   Bordetella pertussis NOT DETECTED NOT  DETECTED Final   Bordetella Parapertussis NOT DETECTED NOT DETECTED Final   Chlamydophila pneumoniae NOT DETECTED NOT DETECTED Final   Mycoplasma pneumoniae NOT DETECTED NOT DETECTED Final    Comment: Performed at [facility], [address] [place], [place] [address]    Labs: CBC: Recent Labs  Lab 11/13/22 0424 11/14/22 0536 11/14/22 2100 11/15/22 0621 11/15/22 1528 11/16/22 0625  WBC 3.7* 3.5*  --  3.4* 3.9* 3.4*  NEUTROABS  --   --   --  2.6 3.1  --   HGB 7.3* 6.8* 7.9* 8.3* 7.8* 8.5*  HCT 22.0*  21.3* 23.8* 24.8* 23.7* 26.1*  MCV 83.0 85.9  --  84.1 83.2 84.7  PLT 114* 103*  --  71* 101* 113*   Basic Metabolic Panel: Recent Labs  Lab 11/10/22 2208 11/12/22 0516 11/12/22 1548 11/13/22 0424 11/14/22 0536  NA 134* 136  --  135 135  K 4.9 5.2* 4.2 4.1 4.1  CL 101 101  --  101 100  CO2 22 26  --  25 27  GLUCOSE 206* 110*  --  100* 98  BUN 40* 37*  --  32* 28*  CREATININE 1.27* 1.30*  --  1.23 1.22  CALCIUM 8.9 9.3  --  9.0 9.1   CBG: Recent Labs  Lab 11/15/22 1203 11/15/22 1610 11/15/22 2130 11/16/22 0730 11/16/22 1311  GLUCAP 84 198* 211* 188* 197*    Discharge time spent: greater than 30 minutes.  Signed: Enedina Finner, MD [facility] 11/16/2022

## 2022-11-17 ENCOUNTER — Inpatient Hospital Stay: Payer: Medicare HMO

## 2022-11-17 ENCOUNTER — Other Ambulatory Visit: Payer: Self-pay | Admitting: Internal Medicine

## 2022-11-17 LAB — CULTURE, BLOOD (ROUTINE X 2)
Culture: NO GROWTH
Culture: NO GROWTH
Special Requests: ADEQUATE
Special Requests: ADEQUATE

## 2022-11-17 LAB — SURGICAL PATHOLOGY

## 2022-11-17 MED ORDER — OXYCODONE HCL 5 MG PO TABS: 5.0000 mg | ORAL_TABLET | Freq: Three times a day (TID) | ORAL | 0 refills | Status: DC | PRN

## 2022-11-17 NOTE — Progress Notes (Unsigned)
{  Select_TRH_Note:[id number]} 

## 2022-11-20 ENCOUNTER — Other Ambulatory Visit: Payer: Self-pay

## 2022-11-20 ENCOUNTER — Inpatient Hospital Stay: Payer: Medicare HMO

## 2022-11-20 ENCOUNTER — Emergency Department: Payer: Medicare HMO

## 2022-11-20 ENCOUNTER — Inpatient Hospital Stay: Payer: Medicare HMO | Attending: Oncology

## 2022-11-20 ENCOUNTER — Telehealth: Payer: Self-pay | Admitting: *Deleted

## 2022-11-20 ENCOUNTER — Encounter: Payer: Self-pay | Admitting: *Deleted

## 2022-11-20 ENCOUNTER — Inpatient Hospital Stay
Admission: EM | Admit: 2022-11-20 | Discharge: 2022-12-07 | DRG: 722 | Disposition: E | Payer: Medicare HMO | Attending: Internal Medicine | Admitting: Internal Medicine

## 2022-11-20 ENCOUNTER — Ambulatory Visit: Payer: Medicare HMO

## 2022-11-20 DIAGNOSIS — D509 Iron deficiency anemia, unspecified: Secondary | ICD-10-CM | POA: Diagnosis present

## 2022-11-20 DIAGNOSIS — E1122 Type 2 diabetes mellitus with diabetic chronic kidney disease: Secondary | ICD-10-CM | POA: Diagnosis present

## 2022-11-20 DIAGNOSIS — Z515 Encounter for palliative care: Secondary | ICD-10-CM | POA: Diagnosis not present

## 2022-11-20 DIAGNOSIS — C7951 Secondary malignant neoplasm of bone: Secondary | ICD-10-CM | POA: Diagnosis present

## 2022-11-20 DIAGNOSIS — I129 Hypertensive chronic kidney disease with stage 1 through stage 4 chronic kidney disease, or unspecified chronic kidney disease: Secondary | ICD-10-CM | POA: Diagnosis present

## 2022-11-20 DIAGNOSIS — R319 Hematuria, unspecified: Secondary | ICD-10-CM | POA: Diagnosis present

## 2022-11-20 DIAGNOSIS — G9340 Encephalopathy, unspecified: Principal | ICD-10-CM

## 2022-11-20 DIAGNOSIS — J90 Pleural effusion, not elsewhere classified: Secondary | ICD-10-CM | POA: Diagnosis present

## 2022-11-20 DIAGNOSIS — G8929 Other chronic pain: Secondary | ICD-10-CM | POA: Diagnosis present

## 2022-11-20 DIAGNOSIS — D631 Anemia in chronic kidney disease: Secondary | ICD-10-CM | POA: Diagnosis present

## 2022-11-20 DIAGNOSIS — D472 Monoclonal gammopathy: Secondary | ICD-10-CM | POA: Diagnosis present

## 2022-11-20 DIAGNOSIS — I10 Essential (primary) hypertension: Secondary | ICD-10-CM | POA: Diagnosis present

## 2022-11-20 DIAGNOSIS — N179 Acute kidney failure, unspecified: Secondary | ICD-10-CM | POA: Diagnosis present

## 2022-11-20 DIAGNOSIS — N189 Chronic kidney disease, unspecified: Secondary | ICD-10-CM | POA: Diagnosis present

## 2022-11-20 DIAGNOSIS — R570 Cardiogenic shock: Secondary | ICD-10-CM | POA: Diagnosis not present

## 2022-11-20 DIAGNOSIS — R578 Other shock: Secondary | ICD-10-CM | POA: Diagnosis present

## 2022-11-20 DIAGNOSIS — Z66 Do not resuscitate: Secondary | ICD-10-CM | POA: Diagnosis not present

## 2022-11-20 DIAGNOSIS — I421 Obstructive hypertrophic cardiomyopathy: Secondary | ICD-10-CM | POA: Diagnosis present

## 2022-11-20 DIAGNOSIS — Z794 Long term (current) use of insulin: Secondary | ICD-10-CM | POA: Diagnosis not present

## 2022-11-20 DIAGNOSIS — Z7985 Long-term (current) use of injectable non-insulin antidiabetic drugs: Secondary | ICD-10-CM

## 2022-11-20 DIAGNOSIS — N10 Acute tubulo-interstitial nephritis: Secondary | ICD-10-CM

## 2022-11-20 DIAGNOSIS — R579 Shock, unspecified: Secondary | ICD-10-CM | POA: Diagnosis present

## 2022-11-20 DIAGNOSIS — R4182 Altered mental status, unspecified: Secondary | ICD-10-CM | POA: Diagnosis present

## 2022-11-20 DIAGNOSIS — Z833 Family history of diabetes mellitus: Secondary | ICD-10-CM

## 2022-11-20 DIAGNOSIS — C61 Malignant neoplasm of prostate: Secondary | ICD-10-CM | POA: Insufficient documentation

## 2022-11-20 DIAGNOSIS — R404 Transient alteration of awareness: Secondary | ICD-10-CM | POA: Diagnosis not present

## 2022-11-20 DIAGNOSIS — J9811 Atelectasis: Secondary | ICD-10-CM | POA: Diagnosis present

## 2022-11-20 DIAGNOSIS — N1832 Chronic kidney disease, stage 3b: Secondary | ICD-10-CM | POA: Diagnosis present

## 2022-11-20 DIAGNOSIS — E1165 Type 2 diabetes mellitus with hyperglycemia: Secondary | ICD-10-CM

## 2022-11-20 DIAGNOSIS — Z961 Presence of intraocular lens: Secondary | ICD-10-CM | POA: Diagnosis present

## 2022-11-20 DIAGNOSIS — Z9221 Personal history of antineoplastic chemotherapy: Secondary | ICD-10-CM

## 2022-11-20 DIAGNOSIS — D630 Anemia in neoplastic disease: Secondary | ICD-10-CM | POA: Diagnosis present

## 2022-11-20 DIAGNOSIS — Z1152 Encounter for screening for COVID-19: Secondary | ICD-10-CM | POA: Diagnosis not present

## 2022-11-20 DIAGNOSIS — E872 Acidosis, unspecified: Secondary | ICD-10-CM | POA: Diagnosis present

## 2022-11-20 DIAGNOSIS — H9193 Unspecified hearing loss, bilateral: Secondary | ICD-10-CM | POA: Diagnosis present

## 2022-11-20 DIAGNOSIS — G9341 Metabolic encephalopathy: Secondary | ICD-10-CM | POA: Diagnosis present

## 2022-11-20 DIAGNOSIS — A419 Sepsis, unspecified organism: Secondary | ICD-10-CM | POA: Diagnosis present

## 2022-11-20 DIAGNOSIS — Z96652 Presence of left artificial knee joint: Secondary | ICD-10-CM | POA: Diagnosis present

## 2022-11-20 DIAGNOSIS — R7989 Other specified abnormal findings of blood chemistry: Secondary | ICD-10-CM | POA: Diagnosis present

## 2022-11-20 DIAGNOSIS — Z79899 Other long term (current) drug therapy: Secondary | ICD-10-CM

## 2022-11-20 DIAGNOSIS — R55 Syncope and collapse: Secondary | ICD-10-CM | POA: Diagnosis not present

## 2022-11-20 DIAGNOSIS — I2510 Atherosclerotic heart disease of native coronary artery without angina pectoris: Secondary | ICD-10-CM | POA: Diagnosis present

## 2022-11-20 DIAGNOSIS — R531 Weakness: Secondary | ICD-10-CM

## 2022-11-20 DIAGNOSIS — Z7982 Long term (current) use of aspirin: Secondary | ICD-10-CM

## 2022-11-20 DIAGNOSIS — D619 Aplastic anemia, unspecified: Secondary | ICD-10-CM | POA: Diagnosis not present

## 2022-11-20 DIAGNOSIS — N400 Enlarged prostate without lower urinary tract symptoms: Secondary | ICD-10-CM | POA: Diagnosis present

## 2022-11-20 DIAGNOSIS — R791 Abnormal coagulation profile: Secondary | ICD-10-CM | POA: Diagnosis present

## 2022-11-20 DIAGNOSIS — N183 Chronic kidney disease, stage 3 unspecified: Secondary | ICD-10-CM | POA: Diagnosis present

## 2022-11-20 DIAGNOSIS — Z974 Presence of external hearing-aid: Secondary | ICD-10-CM

## 2022-11-20 DIAGNOSIS — M545 Low back pain, unspecified: Secondary | ICD-10-CM | POA: Diagnosis present

## 2022-11-20 DIAGNOSIS — D649 Anemia, unspecified: Secondary | ICD-10-CM | POA: Diagnosis not present

## 2022-11-20 DIAGNOSIS — Z7189 Other specified counseling: Secondary | ICD-10-CM | POA: Diagnosis not present

## 2022-11-20 LAB — URINALYSIS, ROUTINE W REFLEX MICROSCOPIC
Bilirubin Urine: NEGATIVE
Glucose, UA: >=500 mg/dL — AB
Ketones, ur: NEGATIVE mg/dL
Nitrite: NEGATIVE
Protein, ur: 30 mg/dL — AB
Specific Gravity, Urine: 1.02 (ref 1.005–1.030)
pH: 6 (ref 5.0–8.0)

## 2022-11-20 LAB — APTT: aPTT: 31 seconds (ref 24–36)

## 2022-11-20 LAB — COMPREHENSIVE METABOLIC PANEL
ALT: 23 U/L (ref 0–44)
AST: 25 U/L (ref 15–41)
Albumin: 2.6 g/dL — ABNORMAL LOW (ref 3.5–5.0)
Alkaline Phosphatase: 83 U/L (ref 38–126)
Anion gap: 11 (ref 5–15)
BUN: 36 mg/dL — ABNORMAL HIGH (ref 8–23)
CO2: 25 mmol/L (ref 22–32)
Calcium: 12.1 mg/dL — ABNORMAL HIGH (ref 8.9–10.3)
Chloride: 100 mmol/L (ref 98–111)
Creatinine, Ser: 1.67 mg/dL — ABNORMAL HIGH (ref 0.61–1.24)
GFR, Estimated: 41 mL/min — ABNORMAL LOW (ref >60)
Glucose, Bld: 222 mg/dL — ABNORMAL HIGH (ref 70–99)
Potassium: 4.7 mmol/L (ref 3.5–5.1)
Sodium: 136 mmol/L (ref 135–145)
Total Bilirubin: 0.7 mg/dL (ref 0.3–1.2)
Total Protein: 7 g/dL (ref 6.5–8.1)

## 2022-11-20 LAB — CBC WITH DIFFERENTIAL/PLATELET
Abs Immature Granulocytes: 0.54 10*3/uL — ABNORMAL HIGH (ref 0.00–0.07)
Basophils Absolute: 0 10*3/uL (ref 0.0–0.1)
Basophils Relative: 0 %
Eosinophils Absolute: 0 10*3/uL (ref 0.0–0.5)
Eosinophils Relative: 0 %
HCT: 29.8 % — ABNORMAL LOW (ref 39.0–52.0)
Hemoglobin: 9.1 g/dL — ABNORMAL LOW (ref 13.0–17.0)
Immature Granulocytes: 8 %
Lymphocytes Relative: 5 %
Lymphs Abs: 0.4 10*3/uL — ABNORMAL LOW (ref 0.7–4.0)
MCH: 26.8 pg (ref 26.0–34.0)
MCHC: 30.5 g/dL (ref 30.0–36.0)
MCV: 87.9 fL (ref 80.0–100.0)
Monocytes Absolute: 0.3 10*3/uL (ref 0.1–1.0)
Monocytes Relative: 5 %
Neutro Abs: 5.7 10*3/uL (ref 1.7–7.7)
Neutrophils Relative %: 82 %
Platelets: 171 10*3/uL (ref 150–400)
RBC: 3.39 MIL/uL — ABNORMAL LOW (ref 4.22–5.81)
RDW: 17.3 % — ABNORMAL HIGH (ref 11.5–15.5)
Smear Review: NORMAL
WBC: 7 10*3/uL (ref 4.0–10.5)
nRBC: 1.1 % — ABNORMAL HIGH (ref 0.0–0.2)

## 2022-11-20 LAB — PROTIME-INR
INR: 1.3 — ABNORMAL HIGH (ref 0.8–1.2)
Prothrombin Time: 16.2 seconds — ABNORMAL HIGH (ref 11.4–15.2)

## 2022-11-20 LAB — URINE DRUG SCREEN, QUALITATIVE (ARMC ONLY)
Amphetamines, Ur Screen: NOT DETECTED
Barbiturates, Ur Screen: NOT DETECTED
Benzodiazepine, Ur Scrn: NOT DETECTED
Cannabinoid 50 Ng, Ur ~~LOC~~: NOT DETECTED
Cocaine Metabolite,Ur ~~LOC~~: NOT DETECTED
MDMA (Ecstasy)Ur Screen: NOT DETECTED
Methadone Scn, Ur: NOT DETECTED
Opiate, Ur Screen: NOT DETECTED
Phencyclidine (PCP) Ur S: NOT DETECTED
Tricyclic, Ur Screen: NOT DETECTED

## 2022-11-20 LAB — LACTIC ACID, PLASMA
Lactic Acid, Venous: 2.6 mmol/L (ref 0.5–1.9)
Lactic Acid, Venous: 2.1 mmol/L (ref 0.5–1.9)

## 2022-11-20 LAB — GLUCOSE, CAPILLARY: Glucose-Capillary: 161 mg/dL — ABNORMAL HIGH (ref 70–99)

## 2022-11-20 LAB — RESP PANEL BY RT-PCR (RSV, FLU A&B, COVID)  RVPGX2
Influenza A by PCR: NEGATIVE
Influenza B by PCR: NEGATIVE
Resp Syncytial Virus by PCR: NEGATIVE
SARS Coronavirus 2 by RT PCR: NEGATIVE

## 2022-11-20 LAB — ETHANOL: Alcohol, Ethyl (B): <10 mg/dL (ref <10)

## 2022-11-20 LAB — TROPONIN I (HIGH SENSITIVITY)
Troponin I (High Sensitivity): 24 ng/L — ABNORMAL HIGH (ref <18)
Troponin I (High Sensitivity): 22 ng/L — ABNORMAL HIGH (ref <18)

## 2022-11-20 LAB — CBG MONITORING, ED: Glucose-Capillary: 157 mg/dL — ABNORMAL HIGH (ref 70–99)

## 2022-11-20 MED ORDER — ONDANSETRON HCL 4 MG PO TABS: 4.0000 mg | ORAL_TABLET | Freq: Four times a day (QID) | ORAL | Status: DC | PRN

## 2022-11-20 MED ORDER — ASPIRIN 81 MG PO CHEW
81.0000 mg | CHEWABLE_TABLET | Freq: Every day | ORAL | Status: DC
  Administered 2022-11-20 – 2022-11-24 (×4): 81 mg via ORAL
  Filled 2022-11-20 (×5): qty 1

## 2022-11-20 MED ORDER — SODIUM CHLORIDE 0.9 % IV SOLN
2.0000 g | Freq: Two times a day (BID) | INTRAVENOUS | Status: DC
  Administered 2022-11-21 – 2022-11-22 (×3): 2 g via INTRAVENOUS
  Filled 2022-11-20 (×4): qty 12.5

## 2022-11-20 MED ORDER — IOHEXOL 350 MG/ML SOLN
75.0000 mL | Freq: Once | INTRAVENOUS | Status: AC | PRN
  Administered 2022-11-20: 75 mL via INTRAVENOUS

## 2022-11-20 MED ORDER — SENNA 8.6 MG PO TABS
1.0000 | ORAL_TABLET | Freq: Every day | ORAL | Status: DC
  Administered 2022-11-21 – 2022-11-24 (×3): 8.6 mg via ORAL
  Filled 2022-11-20 (×4): qty 1

## 2022-11-20 MED ORDER — LACTATED RINGERS IV BOLUS
1500.0000 mL | Freq: Once | INTRAVENOUS | Status: AC
  Administered 2022-11-20: 1500 mL via INTRAVENOUS

## 2022-11-20 MED ORDER — ASPIRIN 300 MG RE SUPP: 300.0000 mg | Freq: Every day | RECTAL | Status: DC

## 2022-11-20 MED ORDER — ACETAMINOPHEN 650 MG RE SUPP: 650.0000 mg | Freq: Four times a day (QID) | RECTAL | Status: DC | PRN

## 2022-11-20 MED ORDER — GADOBUTROL 1 MMOL/ML IV SOLN
10.0000 mL | Freq: Once | INTRAVENOUS | Status: AC | PRN
  Administered 2022-11-20: 10 mL via INTRAVENOUS

## 2022-11-20 MED ORDER — SODIUM CHLORIDE 0.9 % IV SOLN
2.0000 g | Freq: Once | INTRAVENOUS | Status: AC
  Administered 2022-11-20: 2 g via INTRAVENOUS
  Filled 2022-11-20: qty 12.5

## 2022-11-20 MED ORDER — HYDROCODONE-ACETAMINOPHEN 5-325 MG PO TABS
1.0000 | ORAL_TABLET | Freq: Four times a day (QID) | ORAL | Status: DC | PRN
  Administered 2022-11-21 – 2022-11-23 (×3): 2 via ORAL
  Filled 2022-11-20 (×4): qty 2

## 2022-11-20 MED ORDER — ENOXAPARIN SODIUM 40 MG/0.4ML IJ SOSY
40.0000 mg | PREFILLED_SYRINGE | INTRAMUSCULAR | Status: DC
  Administered 2022-11-20 – 2022-11-23 (×4): 40 mg via SUBCUTANEOUS
  Filled 2022-11-20 (×4): qty 0.4

## 2022-11-20 MED ORDER — ACETAMINOPHEN 325 MG PO TABS: 650.0000 mg | ORAL_TABLET | Freq: Four times a day (QID) | ORAL | Status: DC | PRN

## 2022-11-20 MED ORDER — SODIUM CHLORIDE 0.9% FLUSH
3.0000 mL | Freq: Two times a day (BID) | INTRAVENOUS | Status: DC
  Administered 2022-11-20 – 2022-11-24 (×9): 3 mL via INTRAVENOUS

## 2022-11-20 MED ORDER — INSULIN GLARGINE-YFGN 100 UNIT/ML ~~LOC~~ SOLN
12.0000 [IU] | Freq: Every day | SUBCUTANEOUS | Status: DC
  Administered 2022-11-20 – 2022-11-23 (×4): 12 [IU] via SUBCUTANEOUS
  Filled 2022-11-20 (×5): qty 0.12

## 2022-11-20 MED ORDER — INSULIN ASPART 100 UNIT/ML IJ SOLN
0.0000 [IU] | Freq: Three times a day (TID) | INTRAMUSCULAR | Status: DC
  Administered 2022-11-21 (×2): 2 [IU] via SUBCUTANEOUS
  Administered 2022-11-22 (×2): 3 [IU] via SUBCUTANEOUS
  Filled 2022-11-20 (×4): qty 1

## 2022-11-20 MED ORDER — ONDANSETRON HCL 4 MG/2ML IJ SOLN: 4.0000 mg | Freq: Four times a day (QID) | INTRAMUSCULAR | Status: DC | PRN

## 2022-11-20 MED ORDER — LACTATED RINGERS IV SOLN: INTRAVENOUS | Status: AC

## 2022-11-20 MED ORDER — LACTATED RINGERS IV BOLUS (SEPSIS)
1000.0000 mL | Freq: Once | INTRAVENOUS | Status: AC
  Administered 2022-11-20: 1000 mL via INTRAVENOUS

## 2022-11-20 MED ORDER — PRAVASTATIN SODIUM 20 MG PO TABS
40.0000 mg | ORAL_TABLET | Freq: Every day | ORAL | Status: DC
  Administered 2022-11-22: 40 mg via ORAL
  Filled 2022-11-20: qty 1

## 2022-11-20 MED ORDER — LACTATED RINGERS IV SOLN: 150.0000 mL/h | INTRAVENOUS | Status: DC

## 2022-11-20 NOTE — Progress Notes (Signed)
Telestroke RN note:  1002-code stroke activated, Dr. Selina Cooley paged [phone]-Dr. Selina Cooley arrived to pt bedside for assessment 1011-pt taken to CT scan 1028-pt back from CT scan 1034-Dr. Selina Cooley in to speak with family. No TNK recommended at this time.

## 2022-11-20 NOTE — Consult Note (Signed)
PHARMACY -  BRIEF ANTIBIOTIC NOTE   Pharmacy has received consult(s) for cefepime from an ED provider.  The patient's profile has been reviewed for ht/wt/allergies/indication/available labs.    One time order(s) placed for  --Cefepime 2 g IV  Further antibiotics/pharmacy consults should be ordered by admitting physician if indicated.                       Thank you, Tressie Ellis 11/20/2022  11:29 AM

## 2022-11-20 NOTE — ED Notes (Signed)
Pt in MRI when lab arrived. Pt now back in room and lab contacted to come draw labs.

## 2022-11-20 NOTE — Progress Notes (Signed)
CODE SEPSIS - PHARMACY COMMUNICATION  **Broad Spectrum Antibiotics should be administered within 1 hour of Sepsis diagnosis**  Time Code Sepsis Called/Page Received: 1112  Antibiotics Ordered: cefepime 2 grams x 1  Time of 1st antibiotic administration: 1202  Additional action taken by pharmacy: messaged  If necessary, Name of Provider/Nurse Contacted: RN   Elliot Gurney, PharmD, BCPS Clinical Pharmacist  11/20/2022 11:34 AM

## 2022-11-20 NOTE — ED Notes (Signed)
Pt arrived from [facility]. RN states they Visual merchandiser and gave report. Pt was at Cancer center for bloodwork. Oncologist was called bc pt did not appear himself. Pt was clammy and diaphoretic. Pt CBG was in 200s at home and wife reports she gave 4 units of insulin. Pt was hypotensive at [facility]. Pt 02-83% on RA per nurse. Pt has eyes open and appears alert. Skin dry and warm  Charge RN called and pt taken straight to ED 14. STaff present and assisted this RN with getting pt into bed. MD Ray at bedside.

## 2022-11-20 NOTE — Sepsis Progress Note (Signed)
Elink to follow per sepsis protocol

## 2022-11-20 NOTE — ED Notes (Signed)
Unable to obtain [facility] due to difficult collection. Staff attempted collection x2. Per order not to hold antibiotics, IV antibiotics started at this time. EDP notified.

## 2022-11-20 NOTE — Assessment & Plan Note (Signed)
-   Hold home antihypertensives in the setting of hypotension

## 2022-11-20 NOTE — ED Notes (Signed)
Lab contacted to send phlebotomist to obtain labs.

## 2022-11-20 NOTE — Progress Notes (Signed)
[facility]  ([facility]) Hospital Liaison note:  This patient is currently enrolled in [facility] outpatient-based palliative care.  [facility] will continue  to follow for discharge disposition.    Please call for any outpatient based palliative care related questions or concerns.  [facility] Liaison [phone] 

## 2022-11-20 NOTE — Assessment & Plan Note (Signed)
Transient altered mental status when patient was hypotensive, with patient having no recollection of the event.  CVA has been ruled out with CT head, CTA of the head/neck and MRI.  Patient is currently back to baseline.  No further intervention indicated.

## 2022-11-20 NOTE — ED Notes (Addendum)
Pt taken to MRI  

## 2022-11-20 NOTE — ED Notes (Signed)
Patient transported to CT 2

## 2022-11-20 NOTE — ED Triage Notes (Signed)
Pt comes in from the cancer center with complaint of AMS, which isn't his baseline. According to RN at cancer center,pt was diaphoretic and clammy. Pt's BGC was 211, and his wife gave him 4 units of insulin. Pt is alert at this time, but unable to respond to questions as normal. Pt recently stopped taking chemotherapy for prostate cancer. Pt is alert and oriented x2 at this time, but family states that this is not normal for him.

## 2022-11-20 NOTE — ED Notes (Signed)
Code  stroke  called  to  [facility] 

## 2022-11-20 NOTE — ED Notes (Signed)
EEG technician at bedside

## 2022-11-20 NOTE — Telephone Encounter (Signed)
Wife called asking if BM Bx results are back yet and she wants a call back . I see that he is currently in ER

## 2022-11-20 NOTE — Assessment & Plan Note (Addendum)
History of anemia secondary to CKD and bone marrow suppression in the setting of bone metastasis.  Hemoglobin is stable at this time.  - CBC in the a.m.

## 2022-11-20 NOTE — Assessment & Plan Note (Addendum)
Per chart review, patient's creatinine has ranged from 1.3-1.7 over the last 3 years.  Currently 1.67>>1.33, however it was 1.22 approximately 6 days ago.  - IV fluids as ordered - Continue to monitor renal function while admitted

## 2022-11-20 NOTE — Progress Notes (Addendum)
Pharmacy Antibiotic Note  Keith Howe is a 82 y.o. male admitted on 11/20/2022 with UTI with sepsis.  Pharmacy has been consulted for cefepime dosing.  Plan: Cefepime IV 2 grams every 12 hours    Temp (24hrs), Avg:98.1 F (36.7 C), Min:98.1 F (36.7 C), Max:98.1 F (36.7 C)  Recent Labs  Lab 11/14/22 0536 11/15/22 0621 11/15/22 1528 11/16/22 0625 11/20/22 1006 11/20/22 1445  WBC 3.5* 3.4* 3.9* 3.4* 7.0  --   CREATININE 1.22  --   --   --  1.67*  --   LATICACIDVEN  --   --   --   --   --  2.6*    Estimated Creatinine Clearance: 44.2 mL/min (A) (by C-G formula based on SCr of 1.67 mg/dL (H)).    No Known Allergies  Antimicrobials this admission: cefepime 7/15 >>   Dose adjustments this admission: N/a  Microbiology results: 7/15 BCx: in process 7/15 UCx: in process   Thank you for allowing pharmacy to be a part of this patient's care.    Elliot Gurney, PharmD, BCPS Clinical Pharmacist  11/20/2022 6:38 PM

## 2022-11-20 NOTE — Progress Notes (Signed)
   11/20/22 1010  Spiritual Encounters  Type of Visit Initial  Care provided to: Pt and family  Conversation partners present during encounter Nurse;Physician  Referral source Code page  Reason for visit Code  OnCall Visit Yes  Spiritual Framework  Presenting Themes Coping tools;Impactful experiences and emotions  Community/Connection Family;Friend(s);Significant other;Spiritual leader  Patient Stress Factors Health changes  Family Stress Factors None identified  Interventions  Spiritual Care Interventions Made Compassionate presence;Established relationship of care and support;Encouragement  Intervention Outcomes  Outcomes Awareness around self/spiritual resourses;Reduced anxiety;Patient family open to resources  Spiritual Care Plan  Spiritual Care Issues Still Outstanding Chaplain will continue to follow  Advance Directives (For Healthcare)  Does Patient Have a Medical Advance Directive? No  Mental Health Advance Directives  Does Patient Have a Mental Health Advance Directive? No   Chaplain received Code Stroke page for the ED. Chaplain met with patients sister and wife and they told me that the patient was hear for testing and they thought he was having a stroke. Chaplain offered compassionate presence and reflective listening. Medical staff brought patient backed and stated that they wanted to do further testing and keep the patient over night. Chaplain informed the family that she will follow up with the patient once he has been moved to a new room.

## 2022-11-20 NOTE — Consult Note (Addendum)
NEUROLOGY CONSULTATION NOTE   Date of service: November 20, 2022 Patient Name: Keith Howe MRN:  [id number] DOB:  Nov 09, 1940 Reason for consult: stroke code Requesting physician: Dr. Trinna Post _ _ _   _ __   _ __ _ _  __ __   _ __   __ _  History of Present Illness   This is a patient with stage IV prostate cancer, DM2, HTN who presents after presyncopal episode at doctor's office. He was noted to be hypotensive (60s systolic) and diaphoretic. He is now 100s systolic. LKW 23 (family took him and he was at baseline - walks with walker but cognitively intact - when they arrived at doctor's office). After the event he has remained altered with generalized weakness though RUE slightly weaker than LUE. CT head no acute process on personal review. NIHSS = 15. TNK not administered 2/2 majority nonfocal exam and the fact that stroke would not explain his presenting sx of severe hypotension. He has an indwelling foley and was admitted last week for pyelonephritis. CTA no LVO personal review.    ROS   UTA 2/2 AMS  Past History   I have reviewed the following:  Past Medical History:  Diagnosis Date   BPH (benign prostatic hyperplasia)    Cancer (HCC)    Chronic kidney disease    STAGE 3   Diabetes mellitus without complication (HCC)    HOH (hard of hearing)    AIDS   Hypertension    Inguinal hernia    Pain    CHRONIC LBP   Prostate cancer [facility])    Past Surgical History:  Procedure Laterality Date   BACK SURGERY     Lumbar   CATARACT EXTRACTION W/PHACO Right 07/10/2017   Procedure: CATARACT EXTRACTION PHACO AND INTRAOCULAR LENS PLACEMENT (IOC);  Surgeon: Galen Manila, MD;  Location: [facility] ORS;  Service: Ophthalmology;  Laterality: Right;  [place] 00:29.2 AP% 16.5 CDE 4.83 Fluid Pack Lot # [id number]   COLONOSCOPY     EYE SURGERY Bilateral    cataract extraction   HERNIA REPAIR     INGUINAL   IR BONE MARROW BIOPSY & ASPIRATION  11/15/2022   PROSTATE BIOPSY N/A 04/08/2020   Procedure:  PROSTATE BIOPSY Addison Bailey;  Surgeon: Orson Ape, MD;  Location: [facility] ORS;  Service: Urology;  Laterality: N/A;   TOTAL KNEE ARTHROPLASTY Left 03/19/2018   Procedure: TOTAL KNEE ARTHROPLASTY;  Surgeon: Juanell Fairly, MD;  Location: [facility] ORS;  Service: Orthopedics;  Laterality: Left;   Family History  Problem Relation Age of Onset   Diabetes Father    Breast cancer Sister    Social History   Socioeconomic History   Marital status: Married    Spouse name: Not on file   Number of children: Not on file   Years of education: Not on file   Highest education level: Some college, no degree  Occupational History   Not on file  Tobacco Use   Smoking status: Never    Passive exposure: Never   Smokeless tobacco: Never  Vaping Use   Vaping status: Never Used  Substance and Sexual Activity   Alcohol use: No   Drug use: Never   Sexual activity: Not Currently  Other Topics Concern   Not on file  Social History Narrative   Independent at baseline.  Lives at home with his wife   Social Determinants of Health   Financial Resource Strain: Low Risk  (05/12/2021)   Overall Financial Resource Strain (CARDIA)  Difficulty of Paying Living Expenses: Not hard at all  Food Insecurity: No Food Insecurity (11/11/2022)   Hunger Vital Sign    Worried About Running Out of Food in the Last Year: Never true    Ran Out of Food in the Last Year: Never true  Transportation Needs: No Transportation Needs (11/11/2022)   PRAPARE - Administrator, Civil Service (Medical): No    Lack of Transportation (Non-Medical): No  Physical Activity: Insufficiently Active (05/12/2021)   Exercise Vital Sign    Days of Exercise per Week: 7 days    Minutes of Exercise per Session: 10 min  Stress: No Stress Concern Present (05/12/2021)   [employer] of Occupational Health - Occupational Stress Questionnaire    Feeling of Stress : Not at all  Social Connections: Socially Integrated (05/12/2021)   Social  Connection and Isolation Panel [NHANES]    Frequency of Communication with Friends and Family: More than three times a week    Frequency of Social Gatherings with Friends and Family: Once a week    Attends Religious Services: More than 4 times per year    Active Member of [employer] or Organizations: Yes    Attends Engineer, structural: More than 4 times per year    Marital Status: Married   No Known Allergies  Medications   (Not in a hospital admission)    No current facility-administered medications for this encounter.  Current Outpatient Medications:    amLODipine (NORVASC) 5 MG tablet, Take 5 mg by mouth daily., Disp: , Rfl:    aspirin EC 81 MG tablet, Take 81 mg by mouth daily., Disp: , Rfl:    cholecalciferol (VITAMIN D) 1000 units tablet, Take 1,000 Units by mouth daily., Disp: , Rfl:    ciprofloxacin (CIPRO) 500 MG tablet, Take 1 tablet (500 mg total) by mouth 2 (two) times daily for 20 doses., Disp: 20 tablet, Rfl: 0   dapagliflozin propanediol (FARXIGA) 10 MG TABS tablet, Take 10 mg by mouth daily., Disp: , Rfl:    Iron-Vitamin C 65-125 MG TABS, Take 1 tablet by mouth daily., Disp: 30 tablet, Rfl: 3   loratadine (CLARITIN) 10 MG tablet, Take 10 mg by mouth daily as needed., Disp: , Rfl:    losartan (COZAAR) 100 MG tablet, Take 100 mg by mouth daily. Hold for BP below 111., Disp: , Rfl:    lovastatin (MEVACOR) 40 MG tablet, TAKE 1 TABLET BY MOUTH EVERY DAY, Disp: 90 tablet, Rfl: 3   metoprolol tartrate (LOPRESSOR) 50 MG tablet, TAKE 1 TABLET BY MOUTH EVERY 12 HOURS (Patient taking differently: Take 50 mg by mouth 2 (two) times daily. Hold for BP below 111.), Disp: 180 tablet, Rfl: 2   Misc Natural Products (PROSTATE SUPPORT PO), Take 2 capsules by mouth daily., Disp: , Rfl:    NOVOLOG FLEXPEN 100 UNIT/ML FlexPen, Inject into the skin. Per sliding scale, Disp: , Rfl: 0   Omega-3 Fatty Acids (FISH OIL PO), Take 1 capsule by mouth daily. , Disp: , Rfl:    oxyCODONE  (ROXICODONE) 5 MG immediate release tablet, Take 1 tablet (5 mg total) by mouth every 8 (eight) hours as needed., Disp: 20 tablet, Rfl: 0   senna (SENOKOT) 8.6 MG TABS tablet, Take 1 tablet (8.6 mg total) by mouth daily., Disp: 120 tablet, Rfl: 0   TRESIBA FLEXTOUCH 200 UNIT/ML FlexTouch Pen, Inject 26 Units into the skin daily before breakfast., Disp: 3 mL, Rfl: 2   TRULICITY 1.5 MG/0.5ML SOPN,  Inject 1.5 mg into the skin once a week., Disp: , Rfl:    vitamin B-12 (CYANOCOBALAMIN) 500 MCG tablet, Take 500 mcg by mouth daily., Disp: , Rfl:   Vitals   Vitals:   11/20/22 1007 11/20/22 1008 11/20/22 1009 11/20/22 1010  BP:      Pulse:      Resp: (!) 31 16 (!) 23 (!) 23  SpO2:         There is no height or weight on file to calculate BMI.  Physical Exam   Physical Exam Gen: alert oriented to self and age not location or time HEENT: Atraumatic, normocephalic;mucous membranes moist; oropharynx clear, tongue without atrophy or fasciculations. Neck: Supple, trachea midline. Resp: CTAB, no w/r/r CV: RRR, no m/g/r; nml S1 and S2. 2+ symmetric peripheral pulses. Abd: soft/NT/ND; nabs x 4 quad Extrem: Nml bulk; no cyanosis, clubbing, or edema.  Neuro: *MS: alert oriented to self and age not location or time, follows some but not all simple commands *Speech: mild dysarthria, moderate aphasia *CN:    I: Deferred   II,III: PERRLA, blinks to threat bilat, optic discs unable to be visualized 2/2 pupillary constriction   III,IV,VI: EOMI w/o nystagmus, no ptosis   V: Sensation intact from V1 to V3 to LT   VII: Eyelid closure was full.  Smile symmetric.   VIII: Hearing severely impaired to voice   IX,X: Voice normal, palate elevates symmetrically    XI: SCM/trap 5/5 bilat   XII: Tongue protrudes midline, no atrophy or fasciculations   *Motor:   Normal bulk.  No tremor, rigidity or bradykinesia. No pronator drift. BUE drift to bed, RUE slightly weaker than LUE, BLE no movement against gravity  (BLE weakness at baseline but walks with walker) *Sensory: SILT, Symmetric. Propioception intact bilat.  No double-simultaneous extinction.  *Coordination:  UTA 2/2 difficulty following commands *Reflexes:  2+ and symmetric throughout without clonus; toes down-going bilat *Gait: deferred  NIHSS  1a Level of Conscious.: 0 1b LOC Questions: 1 1c LOC Commands: 1 2 Best Gaze: 0 3 Visual: 0 4 Facial Palsy: 0 5a Motor Arm - left: 2 5b Motor Arm - Right: 2 6a Motor Leg - Left: 3 6b Motor Leg - Right: 3 7 Limb Ataxia: 0 8 Sensory: 0 9 Best Language: 2 10 Dysarthria: 1 11 Extinct. and Inatten.: 0  TOTAL: 15   Premorbid mRS = 4   Labs   CBC:  Recent Labs  Lab 11/15/22 0621 11/15/22 1528 11/16/22 0625  WBC 3.4* 3.9* 3.4*  NEUTROABS 2.6 3.1  --   HGB 8.3* 7.8* 8.5*  HCT 24.8* 23.7* 26.1*  MCV 84.1 83.2 84.7  PLT 71* 101* 113*    Basic Metabolic Panel:  Lab Results  Component Value Date   NA 135 11/14/2022   K 4.1 11/14/2022   CO2 27 11/14/2022   GLUCOSE 98 11/14/2022   BUN 28 (H) 11/14/2022   CREATININE 1.22 11/14/2022   CALCIUM 9.1 11/14/2022   GFRNONAA 60 (L) 11/14/2022   GFRAA 46 (L) 06/04/2018   Lipid Panel:  Lab Results  Component Value Date   LDLCALC 43 07/31/2022   HgbA1c:  Lab Results  Component Value Date   HGBA1C 6.8 (H) 07/31/2022   Urine Drug Screen: No results found for: "LABOPIA", "COCAINSCRNUR", "LABBENZ", "AMPHETMU", "THCU", "LABBARB"  Alcohol Level No results found for: "ETH"  CT Head without contrast: No acute process personal review  CT angio Head and Neck with contrast: No LVO personal review  Impression  This is a patient with stage IV prostate cancer, DM2, HTN who presents after presyncopal episode at doctor's office. He was noted to be hypotensive (60s systolic) and diaphoretic. After the event he has remained altered with generalized weakness though RUE slightly weaker than LUE. CT/CTA no emergent findings. TNK not  administered 2/2 majority nonfocal exam and the fact that stroke would not explain his presenting sx of severe hypotension. He has an indwelling foley and was admitted last week for pyelonephritis; suspect sx 2/2 infectious or toxic metabolic picture. Stroke lower on the differential but will obtain MRI brain to rule it out. No hx seizures but given AMS will perform routine EEG.   Recommendations   - ASA 325mg  now f/b 81mg  daily - Permissive HTN x48 hrs from sx onset or until stroke ruled out by MRI goal BP <220/110. PRN labetalol or hydralazine if BP above these parameters. Avoid oral antihypertensives. - MRI brain wwo contrast - wwo 2/2 hx malignancy (ordered) - No further stroke workup indicated if MRI brain is WNL - q 2 hr vitals - NPO until passes RN dysphagia screen - STAT head CT for any change in neuro exam - Tele - PT/OT/SLP - UA, Ucx - Routine EEG  Will continue to follow  ______________________________________________________________________   Thank you for the opportunity to take part in the care of this patient. If you have any further questions, please contact the neurology consultation attending.  Signed,  Bing Neighbors, MD [facility] [phone]  If 7pm- 7am, please page neurology on call as listed in AMION.  **Any copied and pasted documentation in this note was written by me in another application not billed for and pasted by me into this document.

## 2022-11-20 NOTE — Assessment & Plan Note (Addendum)
Patient is presenting with altered mental status, hypotension, tachycardia and tachypnea and elevated lactic acid.  Urinalysis is concerning with either reinfection versus refractory infection given he was just admitted for sepsis secondary to pyelonephritis. Left CVA tenderness noted today. Culture data on most recent admission demonstrated Serratia, which is known for creating biofilm.  - Continue cefepime per pharmacy dosing given risk for hospital acquired UTI - Blood and urine cultures pending - Continue IV fluid resuscitation - Foley catheter has been exchanged in the ED

## 2022-11-20 NOTE — Telephone Encounter (Signed)
Spoke to pt's wife, Ema, informed her that blood transfusion for tomorrow has been cancelled.  Informed her that plan is to review bx results on 7/18, but if he is admitted and she is consulted she can review then. Her main concern is what the plan is? and "if he should hear it." If bad news, she would like to discuss with MD prior to informing pt of results.

## 2022-11-20 NOTE — ED Notes (Signed)
Lab at bedside

## 2022-11-20 NOTE — ED Provider Notes (Signed)
[facility] Provider Note    Event Date/Time   First MD Initiated Contact with Patient 11/20/22 [phone]     (approximate)   History   Weakness   HPI  Keith Howe is a 82 y.o. male with history of diabetes, hypertension, prostate cancer with metastases not currently on chemotherapy, indwelling Foley, recent admission for pyelonephritis presenting to the emergency department for evaluation of altered mental status.  Patient reportedly was at his baseline this morning when he went to have labs drawn at the cancer center.  When he rolled back around 9:15 AM, patient was noted to become unresponsive.  His blood pressure was 63/45 with a heart rate of 113 and patient appeared diaphoretic.  BGL 200s.  He was tried to the ER for further evaluation in the setting of this.  Patient able to tell me his name, unable to provide further history on presentation.  Additional history obtained from family.  They report that patient does have a history of occasional hypotensive or hypoglycemic episodes during which he can become confused, but these normally rapidly resolve and he has not had prolonged episodes of altered mental status like this previously.    Physical Exam   Triage Vital Signs: ED Triage Vitals  Encounter Vitals Group     BP 11/20/22 0943 (!) 112/53     Systolic BP Percentile --      Diastolic BP Percentile --      Pulse Rate 11/20/22 0943 (!) 113     Resp 11/20/22 0943 20     Temp 11/20/22 1038 98.1 F (36.7 C)     Temp Source 11/20/22 1038 Oral     SpO2 11/20/22 0943 100 %     Weight --      Height --      Head Circumference --      Peak Flow --      Pain Score --      Pain Loc --      Pain Education --      Exclude from Growth Chart --     Most recent vital signs: Vitals:   11/20/22 1429 11/20/22 1430  BP:  (!) 99/56  Pulse: (!) 103 (!) 102  Resp: 20 16  Temp:    SpO2: 100% 100%     General: Some but arousable,  interactive CV:  Tachycardic with regular rhythm, normal peripheral perfusion Resp:  Lungs clear, unlabored respirations.  Abd:  Soft, nondistended.  Neuro:  No gross facial asymmetry, generalized weakness, drifts and hits the bed with bilateral upper extremities, some effort antigravity of the lower extremities, on my initial exam seems to have more rapid drift of the left upper extremity compared to the contralateral side.  Correctly answers age, unable to tell me the month.  Following commands.  Extraocular movements intact.  No appreciable field cut.  Limited ability to evaluate ataxia.  Limited vocalization but no appreciable aphasia or dysarthria.   ED Results / Procedures / Treatments   Labs (all labs ordered are listed, but only abnormal results are displayed) Labs Reviewed  PROTIME-INR - Abnormal; Notable for the following components:      Result Value   Prothrombin Time 16.2 (*)    INR 1.3 (*)    All other components within normal limits  COMPREHENSIVE METABOLIC PANEL - Abnormal; Notable for the following components:   Glucose, Bld 222 (*)    BUN 36 (*)    Creatinine, Ser 1.67 (*)  Calcium 12.1 (*)    Albumin 2.6 (*)    GFR, Estimated 41 (*)    All other components within normal limits  CBC WITH DIFFERENTIAL/PLATELET - Abnormal; Notable for the following components:   RBC 3.39 (*)    Hemoglobin 9.1 (*)    HCT 29.8 (*)    RDW 17.3 (*)    nRBC 1.1 (*)    Lymphs Abs 0.4 (*)    Abs Immature Granulocytes 0.54 (*)    All other components within normal limits  LACTIC ACID, PLASMA - Abnormal; Notable for the following components:   Lactic Acid, Venous 2.6 (*)    All other components within normal limits  CBG MONITORING, ED - Abnormal; Notable for the following components:   Glucose-Capillary 157 (*)    All other components within normal limits  TROPONIN I (HIGH SENSITIVITY) - Abnormal; Notable for the following components:   Troponin I (High Sensitivity) 24 (*)    All  other components within normal limits  TROPONIN I (HIGH SENSITIVITY) - Abnormal; Notable for the following components:   Troponin I (High Sensitivity) 22 (*)    All other components within normal limits  RESP PANEL BY RT-PCR (RSV, FLU A&B, COVID)  RVPGX2  CULTURE, BLOOD (ROUTINE X 2)  CULTURE, BLOOD (ROUTINE X 2)  URINE CULTURE  ETHANOL  APTT  CBC  DIFFERENTIAL  URINE DRUG SCREEN, QUALITATIVE ([facility] ONLY)  URINALYSIS, ROUTINE W REFLEX MICROSCOPIC  CBC  [facility] ([facility] ONLY)  LACTIC ACID, PLASMA     EKG EKG independently reviewed interpreted by myself (ER attending) demonstrates:  EKG demonstrates sinus rhythm at a rate of 113, PR 177, QRS 83, QTc 413, no acute ST changes.  RADIOLOGY Imaging independently reviewed and interpreted by myself demonstrates:  CT head without acute bleed. CTA of the neck without large vessel occlusion CXR without focal consolidation  PROCEDURES:  Critical Care performed: Yes, see critical care procedure note(s)  CRITICAL CARE Performed by: Trinna Post   Total critical care time: 34 minutes  Critical care time was exclusive of separately billable procedures and treating other patients.  Critical care was necessary to treat or prevent imminent or life-threatening deterioration.  Critical care was time spent personally by me on the following activities: development of treatment plan with patient and/or surrogate as well as nursing, discussions with consultants, evaluation of patient's response to treatment, examination of patient, obtaining history from patient or surrogate, ordering and performing treatments and interventions, ordering and review of laboratory studies, ordering and review of radiographic studies, pulse oximetry and re-evaluation of patient's condition.   Procedures   MEDICATIONS ORDERED IN ED: Medications  aspirin chewable tablet 81 mg (81 mg Oral Given 11/20/22 1102)    Or  aspirin suppository 300 mg ( Rectal See Alternative  11/20/22 1102)  iohexol (OMNIPAQUE) 350 MG/ML injection 75 mL (75 mLs Intravenous Contrast Given 11/20/22 1021)  lactated ringers bolus 1,000 mL (0 mLs Intravenous Stopped 11/20/22 1409)  ceFEPIme (MAXIPIME) 2 g in sodium chloride 0.9 % 100 mL IVPB (0 g Intravenous Stopped 11/20/22 1409)  gadobutrol (GADAVIST) 1 MMOL/ML injection 10 mL (10 mLs Intravenous Contrast Given 11/20/22 1304)     IMPRESSION / MDM / ASSESSMENT AND PLAN / ED COURSE  I reviewed the triage vital signs and the nursing notes.  Differential diagnosis includes, but is not limited to, CVA, TIA, symptomatic hypotension, questionable sepsis with blood sources including urinary, pneumonia  Patient's presentation is most consistent with acute presentation with potential threat to life  or bodily function.  82 year old male presenting to the emergency department for evaluation of altered mental status.  On presentation here, blood pressure has normalized, but patient remains altered.  Diffuse weakness, but appears to have possible focality.  In the setting of this, code stroke was activated.  Code stroke imaging without acute abnormality.  On her examination, patient seemed to have right greater than left weakness.  Given largely nonfocal exam, she did recommend against TNK administration.  She did recommend medical evaluation for infectious or toxic metabolic encephalopathy with ultimate medicine admission.  Given recent admission for pyelonephritis, and SIRS positive presentation, I did initiate sepsis workup with empiric cefepime as the patient had recently been on Rocephin.  Initially ordered for 1 L of IV fluid. Initial labs here with normal white blood cell count at 7, recently 3.  Stable anemia.  CMP with AKI with creatinine of 1.67, previously 1.22.  Initial troponin slightly elevated at 24, improved from prior from 3 months ago.  Initial lactate elevated at 2.6.  Will order another liter of fluid.  Will reach out to hospitalist to  discuss admission.  Case discussed with Dr. Huel Cote. She will evaluate the patient for anticipated admission.       FINAL CLINICAL IMPRESSION(S) / ED DIAGNOSES   Final diagnoses:  Acute encephalopathy  AKI (acute kidney injury) (HCC)     Rx / DC Orders   ED Discharge Orders     None        Note:  This document was prepared using Dragon voice recognition software and may include unintentional dictation errors.   Trinna Post, MD 11/20/22 [phone]

## 2022-11-20 NOTE — Assessment & Plan Note (Addendum)
-   Hold home antiglycemic agents - SSI, sensitive - Semglee 12 units at bedtime - May need to consider discontinuing [place] +/- Trulicity in the long-term given patient has poor p.o. intake, recurrent hypotension, etc

## 2022-11-20 NOTE — ED Notes (Signed)
Neurologist speaking with family at this time at patients bedside

## 2022-11-20 NOTE — Progress Notes (Signed)
Responded to patient incident in the lab when pt arrived for lab draw and possible injection today. Pt was awake but not responding to questions upon arrival. Vital signs checked, BP 63-45, HR 113. Pt appears diaphoretic. Unable to reach covering provider for further assessment. Pt transported to ED at this time via wheelchair and escorted by nursing staff. Dr. Cathie Hoops and clinic staff made aware. Pt's family made aware and instructed where to meet pt in the ED. Report given to ED triage.

## 2022-11-20 NOTE — Assessment & Plan Note (Signed)
History of prostate cancer, stage IVb with extensive bony metastasis.  Recent bone marrow aspiration demonstrates presence of ~ 20% prostate mets. -Apparently has stopped chemo -Palliative care and oncology consult

## 2022-11-20 NOTE — Progress Notes (Signed)
 Eeg done 

## 2022-11-20 NOTE — H&P (Signed)
History and Physical    Patient: Keith Howe:[id number] DOB: 1941-03-13 DOA: 11/20/2022 DOS: the patient was seen and examined on 11/20/2022 PCP: Keith Downs, MD  Patient coming from: Home  Chief Complaint:  Chief Complaint  Patient presents with   Weakness   HPI: Keith Howe is a 82 y.o. 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  History obtained from patient's wife at bedside and from patient.  Keith Howe states that patient has been experiencing generalized malaise, poor p.o. intake and fatigue over the past day consistently, however it was intermittent prior to that.  In addition, he has continued to have left flank pain that is new.  Previously he had right flank pain during most recent admission.  Otherwise, no recent fever, chills, nausea, vomiting, diarrhea, chest pain, shortness of breath or lower extremity swelling.  No urinary changes noted in Foley bag.  He has been taking prescribed antibiotics as instructed.  Per chart review, patient was wheeled back to the lab at the oncology center when he was noted to be awake but not responding to questions.  Vital signs demonstrated severe hypotension at 63/45 with heart rate of 113.  Patient was diaphoretic.  Due to this, patient was wheeled to the ED.  ED course: On arrival to the ED, patient's blood pressure had improved to 109/63 but did decrease as low as 95/50.  He was noted to be tachycardic with heart rate of 116.  He was afebrile at 98.1.  Initial workup demonstrated hemoglobin of 9.1, creatinine of 1.67, BUN 36 with GFR 41, calcium of 12.1, lactic acid of 2.6.  Troponin elevated at 24 with flat trend to 22.  INR elevated at 1.3.  Urinalysis demonstrated moderate leukocytes, rare bacteria with hematuria.  UDS  negative.  CT head, CTA head/neck and MRI of the brain were obtained with no evidence of acute infarct or LVO.  TRH contacted for admission.  Review of Systems: As mentioned in the history of present illness. All other systems reviewed and are negative.  Past Medical History:  Diagnosis Date   BPH (benign prostatic hyperplasia)    Cancer (HCC)    Chronic kidney disease    STAGE 3   Diabetes mellitus without complication (HCC)    HOH (hard of hearing)    AIDS   Hypertension    Inguinal hernia    Pain    CHRONIC LBP   Prostate cancer [facility])    Past Surgical History:  Procedure Laterality Date   BACK SURGERY     Lumbar   CATARACT EXTRACTION W/PHACO Right 07/10/2017   Procedure: CATARACT EXTRACTION PHACO AND INTRAOCULAR LENS PLACEMENT (IOC);  Surgeon: Galen Manila, MD;  Location: [facility] ORS;  Service: Ophthalmology;  Laterality: Right;  [place] 00:29.2 AP% 16.5 CDE 4.83 Fluid Pack Lot # [id number]   COLONOSCOPY     EYE SURGERY Bilateral    cataract extraction   HERNIA REPAIR     INGUINAL   IR BONE MARROW BIOPSY & ASPIRATION  11/15/2022   PROSTATE BIOPSY N/A 04/08/2020   Procedure: PROSTATE BIOPSY Addison Bailey;  Surgeon: Orson Ape, MD;  Location: [facility] ORS;  Service: Urology;  Laterality: N/A;   TOTAL KNEE ARTHROPLASTY Left 03/19/2018   Procedure: TOTAL KNEE ARTHROPLASTY;  Surgeon: Juanell Fairly, MD;  Location: [facility] ORS;  Service: Orthopedics;  Laterality: Left;   Social History:  reports that he has never smoked. He has never been exposed to tobacco smoke. He has never used smokeless tobacco. He reports that he does not drink alcohol and does not use drugs.  No Known Allergies  Family History  Problem Relation Age of Onset   Diabetes Father    Breast cancer Sister     Prior to Admission medications   Medication Sig Start Date End Date Taking? Authorizing Provider  aspirin EC 81 MG tablet Take 81 mg by mouth daily.   Yes [provider]  cholecalciferol (VITAMIN D)  1000 units tablet Take 1,000 Units by mouth daily.   Yes [provider]  ciprofloxacin (CIPRO) 500 MG tablet Take 1 tablet (500 mg total) by mouth 2 (two) times daily for 20 doses. 11/16/22  Yes Enedina Finner, MD  dapagliflozin propanediol (FARXIGA) 10 MG TABS tablet Take 10 mg by mouth daily.   Yes [provider]  Iron-Vitamin C 65-125 MG TABS Take 1 tablet by mouth daily. 08/23/22  Yes Rickard Patience, MD  loratadine (CLARITIN) 10 MG tablet Take 10 mg by mouth daily as needed.   Yes [provider]  losartan (COZAAR) 100 MG tablet Take 100 mg by mouth daily. Hold for BP below 111.   Yes [provider]  lovastatin (MEVACOR) 40 MG tablet TAKE 1 TABLET BY MOUTH EVERY DAY 04/25/22  Yes Masoud, Renda Rolls, MD  metoprolol tartrate (LOPRESSOR) 50 MG tablet TAKE 1 TABLET BY MOUTH EVERY 12 HOURS Patient taking differently: Take 50 mg by mouth 2 (two) times daily. Hold for BP below 111. 11/25/21  Yes Masoud, Renda Rolls, MD  Misc Natural Products (PROSTATE SUPPORT PO) Take 2 capsules by mouth daily.   Yes [provider]  NOVOLOG FLEXPEN 100 UNIT/ML FlexPen Inject into the skin. Per sliding scale 01/03/18  Yes [provider]  Omega-3 Fatty Acids (FISH OIL PO) Take 1 capsule by mouth daily.    Yes [provider]  oxyCODONE (ROXICODONE) 5 MG immediate release tablet Take 1 tablet (5 mg total) by mouth every 8 (eight) hours as needed. 11/17/22 11/17/23 Yes Enedina Finner, MD  senna (SENOKOT) 8.6 MG TABS tablet Take 1 tablet (8.6 mg total) by mouth daily. 11/17/22  Yes Enedina Finner, MD  TRESIBA FLEXTOUCH 200 UNIT/ML FlexTouch Pen Inject 26 Units into the skin daily before breakfast. 11/16/22  Yes Enedina Finner, MD  TRULICITY 1.5 MG/0.5ML SOPN Inject 1.5 mg into the skin once a week. 08/10/22  Yes [provider]  vitamin B-12 (CYANOCOBALAMIN) 500 MCG tablet Take 500 mcg by mouth daily.   Yes [provider]  amLODipine (NORVASC) 5 MG tablet Take 5 mg by  mouth daily. Patient not taking: Reported on 11/20/2022 11/16/22   [provider]    Physical Exam: Vitals:   11/20/22 1428 11/20/22 1429 11/20/22 1430 11/20/22 1600  BP:   (!) 99/56 (!) 106/54  Pulse: (!) 102 (!) 103 (!) 102   Resp: 18 20 16  (!) 22  Temp:      TempSrc:      SpO2: 100% 100% 100%    Physical Exam Vitals and nursing note reviewed.  Constitutional:      General: He is not in acute distress.    Appearance: He is normal weight. He is not toxic-appearing.  HENT:     Head: Normocephalic and atraumatic.     Mouth/Throat:     Mouth: Mucous membranes are dry.  Pharynx: Oropharynx is clear. No pharyngeal swelling, oropharyngeal exudate, posterior oropharyngeal erythema or uvula swelling.     Comments: Voice hoarseness noted Eyes:     Conjunctiva/sclera: Conjunctivae normal.     Pupils: Pupils are equal, round, and reactive to light.  Cardiovascular:     Rate and Rhythm: Regular rhythm. Tachycardia present.     Heart sounds: No murmur heard.    No gallop.  Pulmonary:     Effort: Pulmonary effort is normal. No respiratory distress.     Breath sounds: Normal breath sounds. No wheezing, rhonchi or rales.  Abdominal:     General: Bowel sounds are normal. There is no distension.     Palpations: Abdomen is soft.     Tenderness: There is no abdominal tenderness. There is left CVA tenderness. There is no right CVA tenderness or guarding.  Musculoskeletal:     Right lower leg: No edema.     Left lower leg: No edema.  Skin:    General: Skin is warm and dry.  Neurological:     General: No focal deficit present.     Mental Status: He is alert and oriented to person, place, and time.     Comments: Notably weak, however equal throughout  Psychiatric:        Mood and Affect: Mood normal.        Behavior: Behavior normal.    Data Reviewed: CBC with WBC of 7.0, hemoglobin 9.1, MCV 87.9, platelets of 171 CMP with sodium of 136, potassium 4.7, bicarb 25, glucose  222, BUN 36, creatinine 1.67, calcium 12.1, albumin 2.6, GFR 41 Lactic acid 2.6 Troponin 24 and then 22 INR 1.3 PTT 31 Alcohol level negative COVID-19, influenza and RSV PCR negative Urinalysis with glucosuria, hematuria, moderate leukocytes, proteinuria, rare bacteria UDS negative  EKG personally reviewed.  Most consistent with sinus rhythm with rate of 113.  No acutely ischemic appearing ST or T wave changes.  MR BRAIN W WO CONTRAST  Result Date: 11/20/2022 CLINICAL DATA:  Neuro deficit, acute, stroke suspected. Right worse than left sided weakness. EXAM: MRI HEAD WITHOUT AND WITH CONTRAST TECHNIQUE: Multiplanar, multiecho pulse sequences of the brain and surrounding structures were obtained without and with intravenous contrast. CONTRAST:  10mL GADAVIST GADOBUTROL 1 MMOL/ML IV SOLN COMPARISON:  CT studies same day. FINDINGS: Brain: Diffusion imaging does not show any acute or subacute infarction. No focal abnormality affects the brainstem or cerebellum. Cerebral hemispheres show age related volume loss without subjective lobar predominance. Patient shows only a very few punctate foci of T2 and FLAIR signal in the white matter, less than usually seen at this age. Dilated perivascular space incidental at the base of the brain on the right. No cortical or large vessel territory infarction. No mass lesion, hemorrhage, hydrocephalus or extra-axial collection. After contrast administration, no abnormal enhancement occurs. Vascular: Major vessels at the base of the brain show flow. Skull and upper cervical spine: Negative Sinuses/Orbits: Clear/normal Other: None IMPRESSION: No acute finding by MRI. Age related volume loss. Minimal white matter changes, less than usually seen at this age. Electronically Signed   By: Paulina Fusi M.D.   On: 11/20/2022 13:11   DG Chest Port 1 View  Result Date: 11/20/2022 CLINICAL DATA:  Clammy and diaphoretic. EXAM: PORTABLE CHEST 1 VIEW COMPARISON:  November 13 2022  FINDINGS: Cardiomediastinal silhouette is normal. Mediastinal contours appear intact. There is no evidence of focal airspace consolidation, pleural effusion or pneumothorax. Low lung volumes. Osseous structures are without acute abnormality.  Soft tissues are grossly normal. IMPRESSION: No active disease. Low lung volumes. Electronically Signed   By: Ted Mcalpine M.D.   On: 11/20/2022 11:43   CT ANGIO HEAD NECK W WO CM (CODE STROKE)  Result Date: 11/20/2022 CLINICAL DATA:  82 year old male code stroke presentation. Hypotensive. Prostate cancer. EXAM: CT ANGIOGRAPHY HEAD AND NECK TECHNIQUE: Multidetector CT imaging of the head and neck was performed using the standard protocol during bolus administration of intravenous contrast. Multiplanar CT image reconstructions and MIPs were obtained to evaluate the vascular anatomy. Carotid stenosis measurements (when applicable) are obtained utilizing NASCET criteria, using the distal internal carotid diameter as the denominator. RADIATION DOSE REDUCTION: This exam was performed according to the departmental dose-optimization program which includes automated exposure control, adjustment of the mA and/or kV according to patient size and/or use of iterative reconstruction technique. CONTRAST:  75mL OMNIPAQUE IOHEXOL 350 MG/ML SOLN COMPARISON:  Plain head CT 1018 hours today. FINDINGS: CTA NECK Skeleton: Advanced cervical spine degeneration. No acute or suspicious osseous lesion identified. Upper chest: Small bilateral layering pleural effusions. Otherwise negative lung apices. Negative visible superior mediastinum. Other neck: Negative. Aortic arch: Mildly bovine arch configuration. Minimal arch atherosclerosis. Right carotid system: Negative brachiocephalic and right CCA. Mild soft and calcified plaque at the right ICA origin but bulky calcified plaque at the right ICA bulb. 50 % stenosis with respect to the distal vessel (series 5, image 103). Patent right ICA to the  skull base. Left carotid system: Negative left CCA. Minimal calcified plaque at the left carotid bifurcation and no stenosis. Vertebral arteries: Proximal right subclavian artery and right vertebral artery origin are normal. Right vertebral artery appears dominant and is patent to the skull base with no plaque or stenosis. Proximal left subclavian artery minimal plaque without stenosis. Calcified plaque at the left vertebral artery origin with mild to moderate stenosis on series 8, image 173. Non dominant left vertebral artery is patent to the skull base with no additional plaque or stenosis. CTA HEAD Posterior circulation: Patent distal vertebral arteries and vertebrobasilar junction with no significant plaque or stenosis. Patent PICA origins. Right V4 is dominant. Patent basilar artery, basilar tip, SCA and PCA origins without stenosis. Left posterior communicating artery is present, the right is diminutive or absent. Bilateral PCA branches are within normal limits. Anterior circulation: Both ICA siphons are patent. Left siphon mild calcified plaque with no significant stenosis. Normal left posterior communicating artery origin. Associated mild right supraclinoid segment stenosis. Patent carotid termini. Patent MCA and ACA origins. Tortuous A1 segments. Anterior communicating artery and bilateral ACA branches are within normal limits. Left MCA M1 segment and bifurcation are patent without stenosis. Right MCA M1 segment and bifurcation are patent without stenosis. Bilateral MCA branches appear symmetric and within normal limits. Venous sinuses: Early contrast timing, not well evaluated. Anatomic variants: Dominant right vertebral artery. Review of the MIP images confirms the above findings IMPRESSION: 1. Negative for large vessel occlusion. Generally mild for age atherosclerosis in the head and neck. 2. Right ICA bulb bulky calcified plaque with 50% stenosis. Up to Moderate stenosis of the Non Dominant Left  Vertebral Artery origin, and mild stenosis of the supraclinoid Right ICA stenosis are due to calcified plaque. 3. Small bilateral layering pleural effusions. Salient findings were communicated to Dr. Andree Elk at 10:38 am on 11/20/2022 by text page via the [facility] messaging system. Electronically Signed   By: Odessa Fleming M.D.   On: 11/20/2022 10:38   CT HEAD CODE STROKE WO CONTRAST  Result Date: 11/20/2022 CLINICAL DATA:  Code stroke. Neuro deficit, acute, stroke suspected. EXAM: CT HEAD WITHOUT CONTRAST TECHNIQUE: Contiguous axial images were obtained from the base of the skull through the vertex without intravenous contrast. RADIATION DOSE REDUCTION: This exam was performed according to the departmental dose-optimization program which includes automated exposure control, adjustment of the mA and/or kV according to patient size and/or use of iterative reconstruction technique. COMPARISON:  09/21/2022 FINDINGS: Brain: No focal abnormality seen affecting the brainstem or cerebellum. There is an old right inferior basal ganglia infarction or dilated perivascular space. Chronic small-vessel ischemic changes affect the cerebral hemispheric white matter. No sign of acute infarction, mass lesion, hemorrhage, hydrocephalus or extra-axial collection. Vascular: There is atherosclerotic calcification of the major vessels at the base of the brain. Skull: Negative Sinuses/Orbits: Clear/normal Other: None ASPECTS ([place] Stroke Program Early CT Score) - Ganglionic level infarction (caudate, lentiform nuclei, internal capsule, insula, M1-M3 cortex): 7 - Supraganglionic infarction (M4-M6 cortex): 3 Total score (0-10 with 10 being normal): 10 IMPRESSION: 1. No acute finding by CT. Chronic small-vessel ischemic changes of the cerebral hemispheric white matter. 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 2. Aspects is 10. These results were communicated to Dr. Selina Cooley at 10:22 am on 11/20/2022 by text page  via the [facility] messaging system. Electronically Signed   By: Paulina Fusi M.D.   On: 11/20/2022 10:24    Results are pending, will review when available.  Assessment and Plan:  * Sepsis (HCC) Patient is presenting with altered mental status, hypotension, tachycardia and tachypnea and elevated lactic acid.  Urinalysis is concerning with either reinfection versus refractory infection given he was just admitted for sepsis secondary to pyelonephritis. Left CVA tenderness noted today. Culture data on most recent admission demonstrated Serratia, which is known for creating biofilm.  - Admit to progressive unit - Cefepime per pharmacy dosing given risk for hospital acquired UTI - Blood and urine cultures pending - Continue IV fluid resuscitation - Foley catheter has been exchanged in the ED  Hypercalcemia New significant hypercalcemia of 12.1, that corrects to 13.2.  Potentially secondary to bony metastasis.  - PTH pending - Aggressive IV fluid resuscitation  Prostate cancer metastatic to [facility]) History of prostate cancer, stage IVb with extensive bony metastasis.  Recent bone marrow aspiration demonstrates presence of ~ 20% prostate mets.  Altered mental status Transient altered mental status when patient was hypotensive, with patient having no recollection of the event.  CVA has been ruled out with CT head, CTA of the head/neck and MRI.  Patient is currently back to baseline.  No further intervention indicated.  Chronic kidney disease, stage 3b (HCC) Per chart review, patient's creatinine has ranged from 1.3-1.7 over the last 3 years.  Currently 1.67, however it was 1.22 approximately 6 days ago.  - IV fluids as ordered - Continue to monitor renal function while admitted  Anemia in stage 3 chronic kidney disease (HCC) History of anemia secondary to CKD and bone marrow suppression in the setting of bone metastasis.  Hemoglobin is stable at this time.  - CBC in the  a.m.  Uncontrolled type 2 diabetes mellitus with hyperglycemia, with long-term current use of insulin (HCC) - Hold home antiglycemic agents - SSI, sensitive - Semglee 12 units at bedtime - May need to consider discontinuing [place] +/- Trulicity in the long-term given patient has poor p.o. intake, recurrent hypotension, etc  Essential hypertension - Hold home antihypertensives in the setting of hypotension.  Advance  Care Planning:   Code Status: Full Code   Consults: None  Family Communication: Patient's wife updated at bedside  Severity of Illness: The appropriate patient status for this patient is INPATIENT. Inpatient status is judged to be reasonable and necessary in order to provide the required intensity of service to ensure the patient's safety. The patient's presenting symptoms, physical exam findings, and initial radiographic and laboratory data in the context of their chronic comorbidities is felt to place them at high risk for further clinical deterioration. Furthermore, it is not anticipated that the patient will be medically stable for discharge from the hospital within 2 midnights of admission.   * I certify that at the point of admission it is my clinical judgment that the patient will require inpatient hospital care spanning beyond 2 midnights from the point of admission due to high intensity of service, high risk for further deterioration and high frequency of surveillance required.*  Author: Verdene Lennert, MD 11/20/2022 6:37 PM  For on call review [email or web address].

## 2022-11-20 NOTE — Assessment & Plan Note (Signed)
New significant hypercalcemia of 12.1, that corrects to 13.2.  Potentially secondary to bony metastasis.  - PTH pending - Aggressive IV fluid resuscitation -Continue to monitor -Oncology was also consulted

## 2022-11-21 ENCOUNTER — Inpatient Hospital Stay: Payer: Medicare HMO

## 2022-11-21 ENCOUNTER — Encounter: Payer: Self-pay | Admitting: Oncology

## 2022-11-21 DIAGNOSIS — D619 Aplastic anemia, unspecified: Secondary | ICD-10-CM

## 2022-11-21 DIAGNOSIS — Z794 Long term (current) use of insulin: Secondary | ICD-10-CM

## 2022-11-21 DIAGNOSIS — N179 Acute kidney failure, unspecified: Secondary | ICD-10-CM | POA: Diagnosis not present

## 2022-11-21 DIAGNOSIS — C61 Malignant neoplasm of prostate: Secondary | ICD-10-CM | POA: Diagnosis not present

## 2022-11-21 DIAGNOSIS — I10 Essential (primary) hypertension: Secondary | ICD-10-CM

## 2022-11-21 DIAGNOSIS — R404 Transient alteration of awareness: Secondary | ICD-10-CM | POA: Diagnosis not present

## 2022-11-21 DIAGNOSIS — C7951 Secondary malignant neoplasm of bone: Secondary | ICD-10-CM

## 2022-11-21 DIAGNOSIS — E1165 Type 2 diabetes mellitus with hyperglycemia: Secondary | ICD-10-CM

## 2022-11-21 DIAGNOSIS — N1832 Chronic kidney disease, stage 3b: Secondary | ICD-10-CM

## 2022-11-21 DIAGNOSIS — R4182 Altered mental status, unspecified: Secondary | ICD-10-CM

## 2022-11-21 DIAGNOSIS — A419 Sepsis, unspecified organism: Secondary | ICD-10-CM | POA: Diagnosis not present

## 2022-11-21 DIAGNOSIS — Z7189 Other specified counseling: Secondary | ICD-10-CM | POA: Diagnosis not present

## 2022-11-21 DIAGNOSIS — D649 Anemia, unspecified: Secondary | ICD-10-CM

## 2022-11-21 LAB — BASIC METABOLIC PANEL
Anion gap: 10 (ref 5–15)
BUN: 30 mg/dL — ABNORMAL HIGH (ref 8–23)
CO2: 26 mmol/L (ref 22–32)
Calcium: 11.3 mg/dL — ABNORMAL HIGH (ref 8.9–10.3)
Chloride: 102 mmol/L (ref 98–111)
Creatinine, Ser: 1.33 mg/dL — ABNORMAL HIGH (ref 0.61–1.24)
GFR, Estimated: 54 mL/min — ABNORMAL LOW (ref >60)
Glucose, Bld: 145 mg/dL — ABNORMAL HIGH (ref 70–99)
Potassium: 3.9 mmol/L (ref 3.5–5.1)
Sodium: 138 mmol/L (ref 135–145)

## 2022-11-21 LAB — CBC
HCT: 21.7 % — ABNORMAL LOW (ref 39.0–52.0)
Hemoglobin: 7 g/dL — ABNORMAL LOW (ref 13.0–17.0)
MCH: 27.8 pg (ref 26.0–34.0)
MCHC: 32.3 g/dL (ref 30.0–36.0)
MCV: 86.1 fL (ref 80.0–100.0)
Platelets: 138 10*3/uL — ABNORMAL LOW (ref 150–400)
RBC: 2.52 MIL/uL — ABNORMAL LOW (ref 4.22–5.81)
RDW: 17.3 % — ABNORMAL HIGH (ref 11.5–15.5)
WBC: 5.5 10*3/uL (ref 4.0–10.5)
nRBC: 1.6 % — ABNORMAL HIGH (ref 0.0–0.2)

## 2022-11-21 LAB — GLUCOSE, CAPILLARY
Glucose-Capillary: 167 mg/dL — ABNORMAL HIGH (ref 70–99)
Glucose-Capillary: 182 mg/dL — ABNORMAL HIGH (ref 70–99)
Glucose-Capillary: 156 mg/dL — ABNORMAL HIGH (ref 70–99)
Glucose-Capillary: 153 mg/dL — ABNORMAL HIGH (ref 70–99)
Glucose-Capillary: 159 mg/dL — ABNORMAL HIGH (ref 70–99)

## 2022-11-21 LAB — URINE CULTURE: Culture: NO GROWTH

## 2022-11-21 LAB — CKMB (ARMC ONLY): CK, MB: 3 ng/mL (ref 0.5–5.0)

## 2022-11-21 LAB — PHOSPHORUS: Phosphorus: 3.9 mg/dL (ref 2.5–4.6)

## 2022-11-21 LAB — PROTIME-INR
INR: 1.3 — ABNORMAL HIGH (ref 0.8–1.2)
Prothrombin Time: 16.3 seconds — ABNORMAL HIGH (ref 11.4–15.2)

## 2022-11-21 LAB — PREPARE RBC (CROSSMATCH)

## 2022-11-21 MED ORDER — SODIUM CHLORIDE 0.9 % IV SOLN
60.0000 mg | Freq: Once | INTRAVENOUS | Status: AC
  Administered 2022-11-21: 60 mg via INTRAVENOUS
  Filled 2022-11-21: qty 20

## 2022-11-21 MED ORDER — EPOETIN ALFA 40000 UNIT/ML IJ SOLN
30000.0000 [IU] | Freq: Once | INTRAMUSCULAR | Status: AC
  Administered 2022-11-21: 30000 [IU] via SUBCUTANEOUS
  Filled 2022-11-21: qty 1

## 2022-11-21 MED ORDER — HYDROMORPHONE HCL 1 MG/ML IJ SOLN
1.0000 mg | INTRAMUSCULAR | Status: DC | PRN
  Filled 2022-11-21: qty 1

## 2022-11-21 MED ORDER — DEXAMETHASONE 0.5 MG PO TABS
0.5000 mg | ORAL_TABLET | Freq: Every day | ORAL | Status: DC
  Administered 2022-11-21 – 2022-11-22 (×2): 0.5 mg via ORAL
  Filled 2022-11-21 (×3): qty 1

## 2022-11-21 MED ORDER — MEGESTROL ACETATE 20 MG PO TABS
80.0000 mg | ORAL_TABLET | Freq: Two times a day (BID) | ORAL | Status: DC
  Administered 2022-11-21 – 2022-11-24 (×3): 80 mg via ORAL
  Filled 2022-11-21 (×9): qty 4

## 2022-11-21 MED ORDER — SODIUM CHLORIDE 0.9% IV SOLUTION: Freq: Once | INTRAVENOUS | Status: AC

## 2022-11-21 MED ORDER — CHLORHEXIDINE GLUCONATE CLOTH 2 % EX PADS
6.0000 | MEDICATED_PAD | Freq: Every day | CUTANEOUS | Status: DC
  Administered 2022-11-21 – 2022-11-24 (×4): 6 via TOPICAL

## 2022-11-21 NOTE — Progress Notes (Signed)
Progress Note   Patient: Keith Howe JSE:[id number] DOB: 1941-01-07 DOA: 11/20/2022     1 DOS: the patient was seen and examined on 11/21/2022   Brief hospital course: Taken from H&P.  Keith Howe is a 82 y.o. 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  Per wife patient with worsening fatigue, generalized malaise and poor p.o. intake.  New left flank pain, previously he had right flank pain during most recent admission. Patient was noted to have decreased responsiveness and blood pressure of 63/45 noted at oncology center and was sent to ED.  ED course: On arrival to the ED, patient's blood pressure had improved to 109/63 but did decrease as low as 95/50.  He was noted to be tachycardic with heart rate of 116.  He was afebrile at 98.1.  Initial workup demonstrated hemoglobin of 9.1, creatinine of 1.67, BUN 36 with GFR 41, calcium of 12.1, lactic acid of 2.6.  Troponin elevated at 24 with flat trend to 22.  INR elevated at 1.3.  Urinalysis demonstrated moderate leukocytes, rare bacteria with hematuria.  UDS negative.  CT head, CTA head/neck and MRI of the brain were obtained with no evidence of acute infarct or LVO.  Admitted for concern of sepsis secondary to UTI and started on cefepime.  7/16; vitals stable, improving renal function, hemoglobin decreased to 7 but all cell lines decreased, likely some dilutional effect.  Recent bone marrow biopsy of at 20% malignant cells and pancytopenia-ordered 1 unit of PRBC after discussing with patient and family  Palliative care from cancer center was also consulted due to grave prognosis with stage IV prostatic cancer with extensive bony mets. Preliminary blood cultures negative in 12 hours, urine cultures pending.     Assessment and  Plan: * Sepsis (HCC) Patient is presenting with altered mental status, hypotension, tachycardia and tachypnea and elevated lactic acid.  Urinalysis is concerning with either reinfection versus refractory infection given he was just admitted for sepsis secondary to pyelonephritis. Left CVA tenderness noted today. Culture data on most recent admission demonstrated Serratia, which is known for creating biofilm.  - Continue cefepime per pharmacy dosing given risk for hospital acquired UTI - Blood and urine cultures pending - Continue IV fluid resuscitation - Foley catheter has been exchanged in the ED  Hypercalcemia New significant hypercalcemia of 12.1, that corrects to 13.2.  Potentially secondary to bony metastasis.  - PTH pending - Aggressive IV fluid resuscitation -Continue to monitor -Oncology was also consulted  Prostate cancer metastatic to bone [facility]) History of prostate cancer, stage IVb with extensive bony metastasis.  Recent bone marrow aspiration demonstrates presence of ~ 20% prostate mets. -Apparently has stopped chemo -Palliative care and oncology consult  Altered mental status Transient altered mental status when patient was hypotensive, with patient having no recollection of the event.  CVA has been ruled out with CT head, CTA of the head/neck and MRI.  Patient is currently back to baseline.  No further intervention indicated.  Normocytic anemia Patient has an history of anemia of chronic disease with CKD and advanced malignancy being followed up at cancer center.  Hemoglobin decreased to 7 this morning, no obvious bleeding.  Recent bone marrow with pancytopenia and 20% malignant cells.  Likely to have some dilutional effect with IV fluid resuscitation. -Ordered 1  unit of PRBC -Monitor hemoglobin  Chronic kidney disease, stage 3b (HCC) Per chart review, patient's creatinine has ranged from 1.3-1.7 over the last 3 years.  Currently 1.67>>1.33, however it was 1.22  approximately 6 days ago.  - IV fluids as ordered - Continue to monitor renal function while admitted  Anemia in stage 3 chronic kidney disease (HCC) History of anemia secondary to CKD and bone marrow suppression in the setting of bone metastasis.  Hemoglobin is stable at this time.  - CBC in the a.m.  Uncontrolled type 2 diabetes mellitus with hyperglycemia, with long-term current use of insulin (HCC) - Hold home antiglycemic agents - SSI, sensitive - Semglee 12 units at bedtime - May need to consider discontinuing [place] +/- Trulicity in the long-term given patient has poor p.o. intake, recurrent hypotension, etc  Essential hypertension - Hold home antihypertensives in the setting of hypotension.   Subjective: Patient was complaining of left flank pain, rating at 9/10.  Also having generalized body aches.  Physical Exam: Vitals:   11/21/22 0000 11/21/22 0439 11/21/22 0800 11/21/22 1014  BP:  116/61 120/75 (!) 131/59  Pulse: (!) 109 (!) 103 95 (!) 106  Resp:  17  16  Temp:  98.2 F (36.8 C) 98.3 F (36.8 C) 98.8 F (37.1 C)  TempSrc:  Oral Oral   SpO2: 95% 97% 98% 95%  Weight:      Height:       General.  Well-developed elderly man, in no acute distress. Pulmonary.  Lungs clear bilaterally, normal respiratory effort. CV.  Regular rate and rhythm, no JVD, rub or murmur. Abdomen.  Soft, left CVA tenderness, nondistended, BS positive. CNS.  Alert and oriented .  No focal neurologic deficit. Extremities.  No edema, no cyanosis, pulses intact and symmetrical. Psychiatry.  Judgment and insight appears normal.   Data Reviewed: Prior data reviewed  Family Communication: Discussed with wife and sister at bedside  Disposition: Status is: Inpatient Remains inpatient appropriate because: Severity of illness  Planned Discharge Destination: Home  DVT prophylaxis.  Lovenox Time spent: 50 minutes  This record has been created using Conservation officer, historic buildings. Errors  have been sought and corrected,but may not always be located. Such creation errors do not reflect on the standard of care.   Author: Arnetha Courser, MD 11/21/2022 12:41 PM  For on call review [email or web address].

## 2022-11-21 NOTE — Hospital Course (Addendum)
Taken from H&P.  Keith Howe is a 81 y.o. 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  Per wife patient with worsening fatigue, generalized malaise and poor p.o. intake.  New left flank pain, previously he had right flank pain during most recent admission. Patient was noted to have decreased responsiveness and blood pressure of 63/45 noted at oncology center and was sent to ED.  ED course: On arrival to the ED, patient's blood pressure had improved to 109/63 but did decrease as low as 95/50.  He was noted to be tachycardic with heart rate of 116.  He was afebrile at 98.1.  Initial workup demonstrated hemoglobin of 9.1, creatinine of 1.67, BUN 36 with GFR 41, calcium of 12.1, lactic acid of 2.6.  Troponin elevated at 24 with flat trend to 22.  INR elevated at 1.3.  Urinalysis demonstrated moderate leukocytes, rare bacteria with hematuria.  UDS negative.  CT head, CTA head/neck and MRI of the brain were obtained with no evidence of acute infarct or LVO.  Admitted for concern of sepsis secondary to UTI and started on cefepime.  7/16; vitals stable, improving renal function, hemoglobin decreased to 7 but all cell lines decreased, likely some dilutional effect.  Recent bone marrow biopsy of at 20% malignant cells and pancytopenia-ordered 1 unit of PRBC after discussing with patient and family  Palliative care was also consulted due to grave prognosis with stage IV prostatic cancer with extensive bony mets. Preliminary blood cultures negative in 12 hours, urine cultures pending.  7/17: Urine and blood cultures negative.  EEG with generalized slowing and no epileptiform discharge, it was ordered in ED for concern of altered mental status.  Patient was on antibiotics for total of 14 days due to  recent pyelonephritis, cefepime switched with ceftriaxone and we will complete that course, day 11 today.  Clinically stable, restarting home antihypertensives. Palliative care is on board and patient can be discharged home likely tomorrow once hospital bed is delivered.  Prognosis remained poor at this time. Patient was given a dose of pamidronate by oncology with some improvement in hypercalcemia.  7/18: Overnight patient became hypotensive and unresponsive, requiring transferring to ICU on pressors.  Patient was given his home dose of antihypertensives yesterday.  Repeat labs with some leukocytosis and decrease of hemoglobin to 5.2 from 8 point 3 in the morning, no obvious bleeding to explain this 3 digit decrease within a day.  2 unit of PRBC was ordered. Patient remained lethargic but responsive and answering simple questions appropriately.  He does not want to talk a whole lot which he was doing during current hospitalization.  Hemoglobin this morning 7.7 after getting 1 unit of PRBC, we will hold off to home antihypertensives at this time and his outpatient providers can restart if appropriate. Normotensive during morning rounds and Levophed has been stopped. Echocardiogram ordered-pending.  7/19:Overnight labile BP, again placed on pressors. Echo with hyperdynamic EF and severe asymmetrical LVH.Rapidly decreasing Hgb despite multiple transfusions, likely cancer taking over bone marrow.Discussed with oncology and they strongly recommend Hospice, unfortunately nothing much to offer at this time. Another family meeting with PCCM and palliative care and family decided to proceed with DNR and comfort care.He is currently unresponsive, family is interested in Hospice facility, not sure whether he will make it, currently anticipating  hospital death.

## 2022-11-21 NOTE — Consult Note (Addendum)
Consultation Note Date: 11/21/2022   Patient Name: Keith Howe  DOB: [birth year]  MRN: [id number]  Age / Sex: 82 y.o., male  PCP: Corky Downs, MD Referring Physician: Arnetha Courser, MD  Reason for Consultation: Establishing goals of care  HPI/Patient Profile: Per H&P, Keith Howe is a 82 y.o. 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  Per wife patient with worsening fatigue, generalized malaise and poor p.o. intake.  New left flank pain, previously he had right flank pain during most recent admission. Patient was noted to have decreased responsiveness and blood pressure of 63/45 noted at oncology center and was sent to ED.  Clinical Assessment and Goals of Care: Patient is resting in bed with eyes closed.  Did not open eyes upon my entry.  His wife is at bedside.  His wife stepped out to speak with me to a separate room.  She states that he has been upset today.  She states  patient has a sister who helps them in decision-making.  Wife states they have a son together who is working in [place] at this time.  She discusses the patient has had a poor p.o. intake, and has lost around 20 pounds in 3 months.  She states he has had a couple of episodes of pain where he has indicated that he was ready to stop life-prolonging care.    Wife states she understands his status, and states she has remained hopeful he will continue life-prolonging care.  We discussed his diagnosis, prognosis, GOC, EOL wishes disposition and options.  Created space and opportunity for patient  to explore thoughts and feelings regarding current medical information.   A detailed discussion was had today regarding advanced  directives.  Concepts specific to code status, artifical feeding and hydration, IV antibiotics and rehospitalization were discussed.  The difference between an aggressive medical intervention path and a comfort care path was discussed.  Values and goals of care important to patient and family were attempted to be elicited.  Discussed limitations of medical interventions to prolong quality of life in some situations and discussed the concept of human mortality.  Plans for oncology to speak with patient and family.  PMT will follow-up tomorrow.        SUMMARY OF RECOMMENDATIONS   Patient will follow   Prognosis:  Poor       Primary Diagnoses: Present on Admission:  Prostate cancer metastatic to bone (HCC)  Anemia in stage 3 chronic kidney disease (HCC)  Essential hypertension  Sepsis (HCC)  Chronic kidney disease, stage 3b (HCC)  Hypercalcemia  Altered mental status  Normocytic anemia   I have reviewed the medical record, interviewed the patient and family, and examined the patient. The following aspects are pertinent.  Past Medical History:  Diagnosis Date   BPH (benign prostatic hyperplasia)  Cancer (HCC)    Chronic kidney disease    STAGE 3   Diabetes mellitus without complication (HCC)    HOH (hard of hearing)    AIDS   Hypertension    Inguinal hernia    Pain    CHRONIC LBP   Prostate cancer (HCC)    Social History   Socioeconomic History   Marital status: Married    Spouse name: Not on file   Number of children: Not on file   Years of education: Not on file   Highest education level: Some college, no degree  Occupational History   Not on file  Tobacco Use   Smoking status: Never    Passive exposure: Never   Smokeless tobacco: Never  Vaping Use   Vaping status: Never Used  Substance and Sexual Activity   Alcohol use: No   Drug use: Never   Sexual activity: Not Currently  Other Topics Concern   Not on file  Social History Narrative    Independent at baseline.  Lives at home with his wife   Social Determinants of Health   Financial Resource Strain: Low Risk  (05/12/2021)   Overall Financial Resource Strain (CARDIA)    Difficulty of Paying Living Expenses: Not hard at all  Food Insecurity: No Food Insecurity (11/20/2022)   Hunger Vital Sign    Worried About Running Out of Food in the Last Year: Never true    Ran Out of Food in the Last Year: Never true  Transportation Needs: No Transportation Needs (11/20/2022)   PRAPARE - Administrator, Civil Service (Medical): No    Lack of Transportation (Non-Medical): No  Physical Activity: Insufficiently Active (05/12/2021)   Exercise Vital Sign    Days of Exercise per Week: 7 days    Minutes of Exercise per Session: 10 min  Stress: No Stress Concern Present (05/12/2021)   [employer] of Occupational Health - Occupational Stress Questionnaire    Feeling of Stress : Not at all  Social Connections: Socially Integrated (05/12/2021)   Social Connection and Isolation Panel [NHANES]    Frequency of Communication with Friends and Family: More than three times a week    Frequency of Social Gatherings with Friends and Family: Once a week    Attends Religious Services: More than 4 times per year    Active Member of [employer] or Organizations: Yes    Attends Engineer, structural: More than 4 times per year    Marital Status: Married   Family History  Problem Relation Age of Onset   Diabetes Father    Breast cancer Sister    Scheduled Meds:  sodium chloride   Intravenous Once   aspirin  81 mg Oral Daily   Chlorhexidine Gluconate Cloth  6 each Topical Daily   enoxaparin (LOVENOX) injection  40 mg Subcutaneous Q24H   insulin aspart  0-9 Units Subcutaneous TID WC   insulin glargine-yfgn  12 Units Subcutaneous QHS   pravastatin  40 mg Oral q1800   senna  1 tablet Oral Daily   sodium chloride flush  3 mL Intravenous Q12H   Continuous Infusions:  ceFEPime (MAXIPIME)  IV 2 g (11/21/22 1232)   PRN Meds:.acetaminophen **OR** acetaminophen, HYDROcodone-acetaminophen, HYDROmorphone (DILAUDID) injection, ondansetron **OR** ondansetron (ZOFRAN) IV Medications Prior to Admission:  Prior to Admission medications   Medication Sig Start Date End Date Taking? Authorizing Provider  aspirin EC 81 MG tablet Take 81 mg by mouth daily.   Yes [provider]  cholecalciferol (VITAMIN D) 1000 units tablet Take 1,000 Units by mouth daily.   Yes [provider]  ciprofloxacin (CIPRO) 500 MG tablet Take 1 tablet (500 mg total) by mouth 2 (two) times daily for 20 doses. 11/16/22  Yes Enedina Finner, MD  dapagliflozin propanediol (FARXIGA) 10 MG TABS tablet Take 10 mg by mouth daily.   Yes [provider]  Iron-Vitamin C 65-125 MG TABS Take 1 tablet by mouth daily. 08/23/22  Yes Rickard Patience, MD  loratadine (CLARITIN) 10 MG tablet Take 10 mg by mouth daily as needed.   Yes [provider]  losartan (COZAAR) 100 MG tablet Take 100 mg by mouth daily. Hold for BP below 111.   Yes [provider]  lovastatin (MEVACOR) 40 MG tablet TAKE 1 TABLET BY MOUTH EVERY DAY 04/25/22  Yes Masoud, Renda Rolls, MD  metoprolol tartrate (LOPRESSOR) 50 MG tablet TAKE 1 TABLET BY MOUTH EVERY 12 HOURS Patient taking differently: Take 50 mg by mouth 2 (two) times daily. Hold for BP below 111. 11/25/21  Yes Masoud, Renda Rolls, MD  Misc Natural Products (PROSTATE SUPPORT PO) Take 2 capsules by mouth daily.   Yes [provider]  NOVOLOG FLEXPEN 100 UNIT/ML FlexPen Inject into the skin. Per sliding scale 01/03/18  Yes [provider]  Omega-3 Fatty Acids (FISH OIL PO) Take 1 capsule by mouth daily.    Yes [provider]  oxyCODONE (ROXICODONE) 5 MG immediate release tablet Take 1 tablet (5 mg total) by mouth every 8 (eight) hours as needed. 11/17/22 11/17/23 Yes Enedina Finner, MD  senna (SENOKOT) 8.6 MG TABS tablet Take 1 tablet (8.6 mg total) by mouth  daily. 11/17/22  Yes Enedina Finner, MD  TRESIBA FLEXTOUCH 200 UNIT/ML FlexTouch Pen Inject 26 Units into the skin daily before breakfast. 11/16/22  Yes Enedina Finner, MD  TRULICITY 1.5 MG/0.5ML SOPN Inject 1.5 mg into the skin once a week. 08/10/22  Yes [provider]  vitamin B-12 (CYANOCOBALAMIN) 500 MCG tablet Take 500 mcg by mouth daily.   Yes [provider]  amLODipine (NORVASC) 5 MG tablet Take 5 mg by mouth daily. Patient not taking: Reported on 11/20/2022 11/16/22   [provider]   No Known Allergies Review of Systems  Unable to perform ROS   Physical Exam Constitutional:      Comments: Eyes closed  Pulmonary:     Effort: Pulmonary effort is normal.     Comments: Even and unlabored    Vital Signs: BP (!) 110/56 (BP Location: Right Arm)   Pulse 96   Temp (!) 97.5 F (36.4 C)   Resp 16   Ht 6\' 2"  (1.88 m)   Wt 100.3 kg   SpO2 97%   BMI 28.39 kg/m  Pain Scale: 0-10   Pain Score: 9    SpO2: SpO2: 97 % O2 Device:SpO2: 97 % O2 Flow Rate: .   IO: Intake/output summary:  Intake/Output Summary (Last 24 hours) at 11/21/2022 1353 Last data filed at 11/21/2022 1018 Gross per 24 hour  Intake 1512.53 ml  Output 2100 ml  Net -587.47 ml    LBM: Last BM Date : 11/19/22 Baseline Weight: Weight: 100.3 kg Most recent weight: Weight: 100.3 kg       Signed by: Morton Stall, NP   Please contact Palliative Medicine Team phone at [phone] for questions and concerns.  For individual provider: See Loretha Stapler

## 2022-11-21 NOTE — TOC Initial Note (Signed)
Transition of Care [facility]) - Initial/Assessment Note    Patient Details  Name: Keith Howe MRN: [id number] Date of Birth: 1941-04-18  Transition of Care [facility]) CM/SW Contact:    Darolyn Rua, LCSW Phone Number: 11/21/2022, 11:49 AM  Clinical Narrative:                  CSW met with patient, patient spouse and sister at bedside. They report that patient is currently active with [facility], continue to see Dr. Juel Burrow as PCP. Reports patient has a rollator at home, preferred pharmacy is [facility]. Wife transports to and from appointments.   They are interested in a hospital bed at home, ordered via [employer].   Confirmed spouse [name]: Mobile [phone] Home - [phone]  Bonita Quin sister Mobile [phone]   Expected Discharge Plan: Home w Home Health Services Barriers to Discharge: Continued Medical Work up   Patient Goals and CMS Choice   CMS Medicare.gov Compare Post Acute Care list provided to:: Patient Represenative (must comment) (spouse at bedside)        Expected Discharge Plan and Services       Living arrangements for the past 2 months: Single Family Home                                      Prior Living Arrangements/Services Living arrangements for the past 2 months: Single Family Home Lives with:: Spouse                   Activities of Daily Living Home Assistive Devices/Equipment: Eyeglasses, Environmental consultant (specify type) ADL Screening (condition at time of admission) Patient's cognitive ability adequate to safely complete daily activities?: No Is the patient deaf or have difficulty hearing?: Yes Does the patient have difficulty seeing, even when wearing glasses/contacts?: No Does the patient have difficulty concentrating, remembering, or making decisions?: Yes Patient able to express need for assistance with ADLs?: Yes Does the patient have difficulty dressing or bathing?: Yes Independently performs ADLs?: No Communication:  Independent Dressing (OT): Needs assistance Grooming: Independent Feeding: Independent Bathing: Needs assistance Toileting: Needs assistance In/Out Bed: Needs assistance Walks in Home: Needs assistance Does the patient have difficulty walking or climbing stairs?: Yes Weakness of Legs: Both Weakness of Arms/Hands: Both  Permission Sought/Granted                  Emotional Assessment              Admission diagnosis:  Acute encephalopathy [G93.40] AKI (acute kidney injury) (HCC) [N17.9] Sepsis (HCC) [A41.9] Patient Active Problem List   Diagnosis Date Noted   Sepsis (HCC) 11/20/2022   Hypercalcemia 11/20/2022   Altered mental status 11/20/2022   Benign prostatic hyperplasia 11/13/2022   Spinal stenosis 11/13/2022   Symptomatic anemia 11/11/2022   Acute exacerbation of chronic low back pain 11/11/2022   Antineoplastic chemotherapy induced anemia 11/11/2022   Compression fracture of L2 lumbar vertebra, closed, initial encounter (HCC) 11/11/2022   Indwelling Foley catheter present 11/11/2022   Hypotension 09/27/2022   Anemia in stage 3 chronic kidney disease (HCC) 09/21/2022   Chronic kidney disease, stage 3b (HCC) 09/21/2022   Leukocytosis 09/21/2022   Bilateral hydronephrosis 09/20/2022   Chemotherapy-induced neuropathy (HCC) 09/13/2022   Weight loss 08/09/2022   Syncope 07/31/2022   SIRS (systemic inflammatory response syndrome) (HCC) 07/31/2022   Obesity (BMI 30-39.9) 07/31/2022   Uncontrolled  type 2 diabetes mellitus with hyperglycemia, with long-term current use of insulin (HCC) 07/31/2022   Myocardial injury 07/31/2022   Bilateral leg edema 07/31/2022   Normocytic anemia 07/31/2022   Chemotherapy induced neutropenia (HCC) 07/05/2022   Encounter for antineoplastic chemotherapy 05/26/2022   Prostate cancer metastatic to bone (HCC) 07/13/2021   Goals of care, counseling/discussion 07/13/2021   Injury of quadriceps muscle 08/26/2020   Chronic kidney  disease, stage 3a (HCC) 05/27/2020   Annual physical exam 02/18/2020   Need for influenza vaccination 02/18/2020   Essential hypertension 12/15/2019   Class 1 obesity with serious comorbidity and body mass index (BMI) of 31.0 to 31.9 in adult 10/13/2019   Type 2 diabetes mellitus without complication, with long-term current use of insulin (HCC) 10/13/2019   S/P TKR (total knee replacement) using cement, left 03/19/2018   PCP:  Corky Downs, MD Pharmacy:   [facility] [address] [place] - [address] [place] [address] Phone: [phone] Fax: [phone]  [facility] - [place], [place] - [address] [place] [address] Phone: [phone] Fax: [phone]  [facility] [place] - [address] [address] [place] [address] Phone: [phone] Fax: [phone]  [id number] [place], [place] - [address] [place] [address] Phone: [phone] Fax: [phone]     Social Determinants of Health (SDOH) Social History: SDOH Screenings   Food Insecurity: No Food Insecurity (11/20/2022)  Housing: Low Risk  (11/20/2022)  Transportation Needs: No Transportation Needs (11/20/2022)  Utilities: Not At Risk (11/20/2022)  Alcohol Screen: Low Risk  (05/12/2021)  Depression (PHQ2-9): Low Risk  (08/29/2021)  Financial Resource Strain: Low Risk  (05/12/2021)  Physical Activity: Insufficiently Active (05/12/2021)  Social Connections: Socially Integrated (05/12/2021)  Stress: No Stress Concern Present (05/12/2021)  Tobacco Use: Low Risk  (11/20/2022)   SDOH Interventions:     Readmission Risk Interventions    09/25/2022   12:18 PM  Readmission Risk Prevention Plan  Transportation Screening Complete  PCP or Specialist Appt within 3-5 Days Complete  HRI or Home Care Consult Complete  Social Work Consult for Recovery Care Planning/Counseling Complete   Palliative Care Screening Complete  Medication Review Oceanographer) Complete

## 2022-11-21 NOTE — Procedures (Signed)
Routine EEG Report  Keith Howe is a 82 y.o. male with a history of altered mental status who is undergoing an EEG to evaluate for seizures.  Report: This EEG was acquired with electrodes placed according to the International 10-20 electrode system (including Fp1, Fp2, F3, F4, C3, C4, P3, P4, O1, O2, T3, T4, T5, T6, A1, A2, Fz, Cz, Pz). The following electrodes were missing or displaced: none.  The occipital dominant rhythm was 7 Hz. This activity is reactive to stimulation. Drowsiness was manifested by background fragmentation; deeper stages of sleep were identified by K complexes and sleep spindles. There was no focal slowing. There were no interictal epileptiform discharges. There were no electrographic seizures identified. Photic stimulation and hyperventilation were not performed.  Impression and clinical correlation: This EEG was obtained while awake and asleep and is abnormal due to mild diffuse slowing indicative of global cerebral dysfunction. Epileptiform abnormalities were not seen during this recording.  Bing Neighbors, MD [facility] [phone]  If 7pm- 7am, please page neurology on call as listed in AMION.

## 2022-11-21 NOTE — Consult Note (Addendum)
Hematology/Oncology Consult note [phone] [phone]      Patient Care Team: Corky Downs, MD as PCP - General (Internal Medicine) Carmina Miller, MD as Consulting Physician (Radiation Oncology) Rickard Patience, MD as Consulting Physician (Oncology) Mady Haagensen, MD as Consulting Physician (Nephrology) Orson Ape, MD as Consulting Physician (Urology)   Name of the patient: Keith Howe  [id number]  31-May-1940   REASON FOR COSULTATION:   History of presenting illness-  82 y.o. male with PMH listed at below who presents to ER due to altered mental status, hypotension. Patient was recently admitted due to symptomatic anemia. Following admission, he presented to cancer center for repeating blood work and was found to be confused with low blood pressure. Patient was sent to emergency room for further evaluation. In emergency room, patient had a blood pressure of 109/63, tachycardic with heart rate of 116.  Febrile.  Hemoglobin 9.1, creatinine 1.67, urinalysis showed moderate leukocytes, rare bacteria with hematuria.  Calcium level was elevated at 12.1, corrected calcium level was 13.2.  CT head, CTA head/neck and MRI of the brain were obtained with no evidence of acute infarct. Patient was admitted for sepsis, treated with IV antibiotics. Today hemoglobin dropped from 9.1 to 7.  Patient has a history of anemia.  It was originally felt to be likely secondary to chemotherapy, for anemia due to chronic kidney disease.  However patient remains anemic despite being off chemotherapy. Patient had a bone marrow biopsy done during last admission.  Bone marrow showed metastatic prostate carcinoma involving approximately 20% of the marrow.  Patchy cellularity into hematopoietic marrow with megakaryocytic hyperplasia with dyspoiesis.  Cytogenetics are pending.  Associated NGS cannot be ruled out.  Patient is known to oncology for metastatic prostate cancer, progressed on  Xtandi, he tried 3 cycles of docetaxel not able to tolerate due to severe anemia.  PSA progressively increases. Hematology oncology was consulted for further evaluation and discussion.  Wife is at the bedside.  Patient is sleeping  No Known Allergies  Patient Active Problem List   Diagnosis Date Noted   Prostate cancer metastatic to bone (HCC) 07/13/2021    Priority: High   Hypotension 09/27/2022    Priority: Medium    Chemotherapy-induced neuropathy (HCC) 09/13/2022    Priority: Medium    Normocytic anemia 07/31/2022    Priority: Medium    Chronic kidney disease, stage 3a (HCC) 05/27/2020    Priority: Medium    Bilateral hydronephrosis 09/20/2022    Priority: Low   Weight loss 08/09/2022    Priority: Low   Chemotherapy induced neutropenia (HCC) 07/05/2022    Priority: Low   Encounter for antineoplastic chemotherapy 05/26/2022    Priority: Low   Goals of care, counseling/discussion 07/13/2021    Priority: Low   Type 2 diabetes mellitus without complication, with long-term current use of insulin (HCC) 10/13/2019    Priority: Low   Sepsis (HCC) 11/20/2022   Hypercalcemia 11/20/2022   Altered mental status 11/20/2022   Benign prostatic hyperplasia 11/13/2022   Spinal stenosis 11/13/2022   Symptomatic anemia 11/11/2022   Acute exacerbation of chronic low back pain 11/11/2022   Antineoplastic chemotherapy induced anemia 11/11/2022   Compression fracture of L2 lumbar vertebra, closed, initial encounter (HCC) 11/11/2022   Indwelling Foley catheter present 11/11/2022   Anemia in stage 3 chronic kidney disease (HCC) 09/21/2022   Chronic kidney disease, stage 3b (HCC) 09/21/2022   Leukocytosis 09/21/2022   Syncope 07/31/2022   SIRS (systemic inflammatory response syndrome) (HCC) 07/31/2022  Obesity (BMI 30-39.9) 07/31/2022   Uncontrolled type 2 diabetes mellitus with hyperglycemia, with long-term current use of insulin (HCC) 07/31/2022   Myocardial injury 07/31/2022    Bilateral leg edema 07/31/2022   Injury of quadriceps muscle 08/26/2020   Annual physical exam 02/18/2020   Need for influenza vaccination 02/18/2020   Essential hypertension 12/15/2019   Class 1 obesity with serious comorbidity and body mass index (BMI) of 31.0 to 31.9 in adult 10/13/2019   S/P TKR (total knee replacement) using cement, left 03/19/2018     Past Medical History:  Diagnosis Date   BPH (benign prostatic hyperplasia)    Cancer (HCC)    Chronic kidney disease    STAGE 3   Diabetes mellitus without complication (HCC)    HOH (hard of hearing)    AIDS   Hypertension    Inguinal hernia    Pain    CHRONIC LBP   Prostate cancer [facility])      Past Surgical History:  Procedure Laterality Date   BACK SURGERY     Lumbar   CATARACT EXTRACTION W/PHACO Right 07/10/2017   Procedure: CATARACT EXTRACTION PHACO AND INTRAOCULAR LENS PLACEMENT (IOC);  Surgeon: Galen Manila, MD;  Location: [facility] ORS;  Service: Ophthalmology;  Laterality: Right;  [place] 00:29.2 AP% 16.5 CDE 4.83 Fluid Pack Lot # [id number]   COLONOSCOPY     EYE SURGERY Bilateral    cataract extraction   HERNIA REPAIR     INGUINAL   IR BONE MARROW BIOPSY & ASPIRATION  11/15/2022   PROSTATE BIOPSY N/A 04/08/2020   Procedure: PROSTATE BIOPSY Addison Bailey;  Surgeon: Orson Ape, MD;  Location: [facility] ORS;  Service: Urology;  Laterality: N/A;   TOTAL KNEE ARTHROPLASTY Left 03/19/2018   Procedure: TOTAL KNEE ARTHROPLASTY;  Surgeon: Juanell Fairly, MD;  Location: [facility] ORS;  Service: Orthopedics;  Laterality: Left;    Social History   Socioeconomic History   Marital status: Married    Spouse name: Not on file   Number of children: Not on file   Years of education: Not on file   Highest education level: Some college, no degree  Occupational History   Not on file  Tobacco Use   Smoking status: Never    Passive exposure: Never   Smokeless tobacco: Never  Vaping Use   Vaping status: Never Used  Substance and Sexual  Activity   Alcohol use: No   Drug use: Never   Sexual activity: Not Currently  Other Topics Concern   Not on file  Social History Narrative   Independent at baseline.  Lives at home with his wife   Social Determinants of Health   Financial Resource Strain: Low Risk  (05/12/2021)   Overall Financial Resource Strain (CARDIA)    Difficulty of Paying Living Expenses: Not hard at all  Food Insecurity: No Food Insecurity (11/20/2022)   Hunger Vital Sign    Worried About Running Out of Food in the Last Year: Never true    Ran Out of Food in the Last Year: Never true  Transportation Needs: No Transportation Needs (11/20/2022)   PRAPARE - Administrator, Civil Service (Medical): No    Lack of Transportation (Non-Medical): No  Physical Activity: Insufficiently Active (05/12/2021)   Exercise Vital Sign    Days of Exercise per Week: 7 days    Minutes of Exercise per Session: 10 min  Stress: No Stress Concern Present (05/12/2021)   [employer] of Occupational Health - Occupational Stress Questionnaire  Feeling of Stress : Not at all  Social Connections: Socially Integrated (05/12/2021)   Social Connection and Isolation Panel [NHANES]    Frequency of Communication with Friends and Family: More than three times a week    Frequency of Social Gatherings with Friends and Family: Once a week    Attends Religious Services: More than 4 times per year    Active Member of [employer] or Organizations: Yes    Attends Engineer, structural: More than 4 times per year    Marital Status: Married  Catering manager Violence: Not At Risk (11/20/2022)   Humiliation, Afraid, Rape, and Kick questionnaire    Fear of Current or Ex-Partner: No    Emotionally Abused: No    Physically Abused: No    Sexually Abused: No     Family History  Problem Relation Age of Onset   Diabetes Father    Breast cancer Sister      Current Facility-Administered Medications:    0.9 %  sodium chloride infusion  (Manually program via Guardrails IV Fluids), , Intravenous, Once, Arnetha Courser, MD   acetaminophen (TYLENOL) tablet 650 mg, 650 mg, Oral, Q6H PRN **OR** acetaminophen (TYLENOL) suppository 650 mg, 650 mg, Rectal, Q6H PRN, Verdene Lennert, MD   aspirin chewable tablet 81 mg, 81 mg, Oral, Daily, 81 mg at 11/21/22 0952 **OR** [DISCONTINUED] aspirin suppository 300 mg, 300 mg, Rectal, Daily, Jefferson Fuel, MD   ceFEPIme (MAXIPIME) 2 g in sodium chloride 0.9 % 100 mL IVPB, 2 g, Intravenous, Q12H, Jaynie Bream, RPH, Last Rate: 200 mL/hr at 11/21/22 1232, 2 g at 11/21/22 1232   Chlorhexidine Gluconate Cloth 2 % PADS 6 each, 6 each, Topical, Daily, Arnetha Courser, MD, 6 each at 11/21/22 1229   enoxaparin (LOVENOX) injection 40 mg, 40 mg, Subcutaneous, Q24H, Verdene Lennert, MD, 40 mg at 11/20/22 2211   HYDROcodone-acetaminophen (NORCO/VICODIN) 5-325 MG per tablet 1-2 tablet, 1-2 tablet, Oral, Q6H PRN, Verdene Lennert, MD, 2 tablet at 11/21/22 0952   HYDROmorphone (DILAUDID) injection 1 mg, 1 mg, Intravenous, Q4H PRN, Arnetha Courser, MD   insulin aspart (novoLOG) injection 0-9 Units, 0-9 Units, Subcutaneous, TID WC, Verdene Lennert, MD, 2 Units at 11/21/22 1229   insulin glargine-yfgn (SEMGLEE) injection 12 Units, 12 Units, Subcutaneous, QHS, Verdene Lennert, MD, 12 Units at 11/20/22 2210   ondansetron (ZOFRAN) tablet 4 mg, 4 mg, Oral, Q6H PRN **OR** ondansetron (ZOFRAN) injection 4 mg, 4 mg, Intravenous, Q6H PRN, Verdene Lennert, MD   pravastatin (PRAVACHOL) tablet 40 mg, 40 mg, Oral, q1800, Verdene Lennert, MD   senna (SENOKOT) tablet 8.6 mg, 1 tablet, Oral, Daily, Verdene Lennert, MD, 8.6 mg at 11/21/22 0952   sodium chloride flush (NS) 0.9 % injection 3 mL, 3 mL, Intravenous, Q12H, Verdene Lennert, MD, 3 mL at 11/21/22 0954  Review of Systems  Constitutional:  Positive for appetite change and fatigue.  Respiratory:  Negative for shortness of breath.   Gastrointestinal:  Negative for abdominal  distention and abdominal pain.  Genitourinary:  Positive for hematuria.        CV tenderness  Musculoskeletal:  Positive for back pain.  Hematological:  Negative for adenopathy.    PHYSICAL EXAM Vitals:   11/21/22 0000 11/21/22 0439 11/21/22 0800 11/21/22 1014  BP:  116/61 120/75 (!) 131/59  Pulse: (!) 109 (!) 103 95 (!) 106  Resp:  17  16  Temp:  98.2 F (36.8 C) 98.3 F (36.8 C) 98.8 F (37.1 C)  TempSrc:  Oral Oral  SpO2: 95% 97% 98% 95%  Weight:      Height:       Physical Exam HENT:     Head: Normocephalic and atraumatic.  Eyes:     General: No scleral icterus. Cardiovascular:     Rate and Rhythm: Normal rate.  Pulmonary:     Effort: Pulmonary effort is normal. No respiratory distress.  Abdominal:     General: Bowel sounds are normal. There is no distension.  Skin:    General: Skin is warm and dry.     Findings: No erythema.  Neurological:     Mental Status: He is alert.     Motor: No abnormal muscle tone.     Comments: Patient is sleeping  Psychiatric:        Mood and Affect: Affect normal.       LABORATORY STUDIES    Latest Ref Rng & Units 11/21/2022    5:30 AM 11/20/2022   10:06 AM 11/16/2022    6:25 AM  CBC  WBC 4.0 - 10.5 K/uL 5.5  7.0  3.4   Hemoglobin 13.0 - 17.0 g/dL 7.0  9.1  8.5   Hematocrit 39.0 - 52.0 % 21.7  29.8  26.1   Platelets 150 - 400 K/uL 138  171  113       Latest Ref Rng & Units 11/21/2022    6:21 AM 11/20/2022   10:06 AM 11/14/2022    5:36 AM  CMP  Glucose 70 - 99 mg/dL 440  347  98   BUN 8 - 23 mg/dL 30  36  28   Creatinine 0.61 - 1.24 mg/dL 4.25  9.56  3.87   Sodium 135 - 145 mmol/L 138  136  135   Potassium 3.5 - 5.1 mmol/L 3.9  4.7  4.1   Chloride 98 - 111 mmol/L 102  100  100   CO2 22 - 32 mmol/L 26  25  27    Calcium 8.9 - 10.3 mg/dL 56.4  33.2  9.1   Total Protein 6.5 - 8.1 g/dL  7.0    Total Bilirubin 0.3 - 1.2 mg/dL  0.7    Alkaline Phos 38 - 126 U/L  83    AST 15 - 41 U/L  25    ALT 0 - 44 U/L  23        RADIOGRAPHIC STUDIES: I have personally reviewed the radiological images as listed and agreed with the findings in the report. MR BRAIN W WO CONTRAST  Result Date: 11/20/2022 CLINICAL DATA:  Neuro deficit, acute, stroke suspected. Right worse than left sided weakness. EXAM: MRI HEAD WITHOUT AND WITH CONTRAST TECHNIQUE: Multiplanar, multiecho pulse sequences of the brain and surrounding structures were obtained without and with intravenous contrast. CONTRAST:  10mL GADAVIST GADOBUTROL 1 MMOL/ML IV SOLN COMPARISON:  CT studies same day. FINDINGS: Brain: Diffusion imaging does not show any acute or subacute infarction. No focal abnormality affects the brainstem or cerebellum. Cerebral hemispheres show age related volume loss without subjective lobar predominance. Patient shows only a very few punctate foci of T2 and FLAIR signal in the white matter, less than usually seen at this age. Dilated perivascular space incidental at the base of the brain on the right. No cortical or large vessel territory infarction. No mass lesion, hemorrhage, hydrocephalus or extra-axial collection. After contrast administration, no abnormal enhancement occurs. Vascular: Major vessels at the base of the brain show flow. Skull and upper cervical spine: Negative Sinuses/Orbits: Clear/normal Other: None IMPRESSION: No  acute finding by MRI. Age related volume loss. Minimal white matter changes, less than usually seen at this age. Electronically Signed   By: Paulina Fusi M.D.   On: 11/20/2022 13:11   DG Chest Port 1 View  Result Date: 11/20/2022 CLINICAL DATA:  Clammy and diaphoretic. EXAM: PORTABLE CHEST 1 VIEW COMPARISON:  November 13 2022 FINDINGS: Cardiomediastinal silhouette is normal. Mediastinal contours appear intact. There is no evidence of focal airspace consolidation, pleural effusion or pneumothorax. Low lung volumes. Osseous structures are without acute abnormality. Soft tissues are grossly normal. IMPRESSION: No active  disease. Low lung volumes. Electronically Signed   By: Ted Mcalpine M.D.   On: 11/20/2022 11:43   CT ANGIO HEAD NECK W WO CM (CODE STROKE)  Result Date: 11/20/2022 CLINICAL DATA:  82 year old male code stroke presentation. Hypotensive. Prostate cancer. EXAM: CT ANGIOGRAPHY HEAD AND NECK TECHNIQUE: Multidetector CT imaging of the head and neck was performed using the standard protocol during bolus administration of intravenous contrast. Multiplanar CT image reconstructions and MIPs were obtained to evaluate the vascular anatomy. Carotid stenosis measurements (when applicable) are obtained utilizing NASCET criteria, using the distal internal carotid diameter as the denominator. RADIATION DOSE REDUCTION: This exam was performed according to the departmental dose-optimization program which includes automated exposure control, adjustment of the mA and/or kV according to patient size and/or use of iterative reconstruction technique. CONTRAST:  75mL OMNIPAQUE IOHEXOL 350 MG/ML SOLN COMPARISON:  Plain head CT 1018 hours today. FINDINGS: CTA NECK Skeleton: Advanced cervical spine degeneration. No acute or suspicious osseous lesion identified. Upper chest: Small bilateral layering pleural effusions. Otherwise negative lung apices. Negative visible superior mediastinum. Other neck: Negative. Aortic arch: Mildly bovine arch configuration. Minimal arch atherosclerosis. Right carotid system: Negative brachiocephalic and right CCA. Mild soft and calcified plaque at the right ICA origin but bulky calcified plaque at the right ICA bulb. 50 % stenosis with respect to the distal vessel (series 5, image 103). Patent right ICA to the skull base. Left carotid system: Negative left CCA. Minimal calcified plaque at the left carotid bifurcation and no stenosis. Vertebral arteries: Proximal right subclavian artery and right vertebral artery origin are normal. Right vertebral artery appears dominant and is patent to the skull base  with no plaque or stenosis. Proximal left subclavian artery minimal plaque without stenosis. Calcified plaque at the left vertebral artery origin with mild to moderate stenosis on series 8, image 173. Non dominant left vertebral artery is patent to the skull base with no additional plaque or stenosis. CTA HEAD Posterior circulation: Patent distal vertebral arteries and vertebrobasilar junction with no significant plaque or stenosis. Patent PICA origins. Right V4 is dominant. Patent basilar artery, basilar tip, SCA and PCA origins without stenosis. Left posterior communicating artery is present, the right is diminutive or absent. Bilateral PCA branches are within normal limits. Anterior circulation: Both ICA siphons are patent. Left siphon mild calcified plaque with no significant stenosis. Normal left posterior communicating artery origin. Associated mild right supraclinoid segment stenosis. Patent carotid termini. Patent MCA and ACA origins. Tortuous A1 segments. Anterior communicating artery and bilateral ACA branches are within normal limits. Left MCA M1 segment and bifurcation are patent without stenosis. Right MCA M1 segment and bifurcation are patent without stenosis. Bilateral MCA branches appear symmetric and within normal limits. Venous sinuses: Early contrast timing, not well evaluated. Anatomic variants: Dominant right vertebral artery. Review of the MIP images confirms the above findings IMPRESSION: 1. Negative for large vessel occlusion. Generally mild for age atherosclerosis  in the head and neck. 2. Right ICA bulb bulky calcified plaque with 50% stenosis. Up to Moderate stenosis of the Non Dominant Left Vertebral Artery origin, and mild stenosis of the supraclinoid Right ICA stenosis are due to calcified plaque. 3. Small bilateral layering pleural effusions. Salient findings were communicated to Dr. Andree Elk at 10:38 am on 11/20/2022 by text page via the [facility] messaging system. Electronically Signed    By: Odessa Fleming M.D.   On: 11/20/2022 10:38   CT HEAD CODE STROKE WO CONTRAST  Result Date: 11/20/2022 CLINICAL DATA:  Code stroke. Neuro deficit, acute, stroke suspected. EXAM: CT HEAD WITHOUT CONTRAST TECHNIQUE: Contiguous axial images were obtained from the base of the skull through the vertex without intravenous contrast. RADIATION DOSE REDUCTION: This exam was performed according to the departmental dose-optimization program which includes automated exposure control, adjustment of the mA and/or kV according to patient size and/or use of iterative reconstruction technique. COMPARISON:  09/21/2022 FINDINGS: Brain: No focal abnormality seen affecting the brainstem or cerebellum. There is an old right inferior basal ganglia infarction or dilated perivascular space. Chronic small-vessel ischemic changes affect the cerebral hemispheric white matter. No sign of acute infarction, mass lesion, hemorrhage, hydrocephalus or extra-axial collection. Vascular: There is atherosclerotic calcification of the major vessels at the base of the brain. Skull: Negative Sinuses/Orbits: Clear/normal Other: None ASPECTS ([place]berta Stroke Program Early CT Score) - Ganglionic level infarction (caudate, lentiform nuclei, internal capsule, insula, M1-M3 cortex): 7 - Supraganglionic infarction (M4-M6 cortex): 3 Total score (0-10 with 10 being normal): 10 IMPRESSION: 1. No acute finding by CT. Chronic small-vessel ischemic changes of the cerebral hemispheric white matter. 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 2. Aspects is 10. These results were communicated to Dr. Selina Cooley at 10:22 am on 11/20/2022 by text page via the [facility] messaging system. Electronically Signed   By: Paulina Fusi M.D.   On: 11/20/2022 10:24   IR BONE MARROW BIOPSY & ASPIRATION  Result Date: 11/15/2022 INDICATION: Anemia EXAM: Bone marrow aspiration and core biopsy using fluoroscopic guidance MEDICATIONS: None. ANESTHESIA/SEDATION:  Moderate (conscious) sedation was employed during this procedure. A total of Versed 1 mg and Fentanyl 25 mcg was administered intravenously. Moderate Sedation Time: 10 minutes. The patient's level of consciousness and vital signs were monitored continuously by radiology nursing throughout the procedure under my direct supervision. FLUOROSCOPY TIME:  Fluoroscopy Time: 0.5 minutes (8 mGy) COMPLICATIONS: None immediate. PROCEDURE: Informed written consent was obtained from the patient after a thorough discussion of the procedural risks, benefits and alternatives. All questions were addressed. Maximal Sterile Barrier Technique was utilized including caps, mask, sterile gowns, sterile gloves, sterile drape, hand hygiene and skin antiseptic. A timeout was performed prior to the initiation of the procedure. The patient was placed prone on the exam table. Limited fluoroscopy of the pelvis was performed for planning purposes. Skin entry site was marked, and the overlying skin was prepped and draped in the standard sterile fashion. Local analgesia was obtained with 1% lidocaine. Using fluoroscopic guidance, an 11 gauge needle was advanced just deep to the cortex of the right posterior ilium. Subsequently, bone marrow aspiration and core biopsy were performed. Due to relatively dry tap, a second core biopsy was obtained. Specimens were submitted to lab/pathology for handling. Hemostasis was achieved with manual pressure, and a clean dressing was placed. The patient tolerated the procedure well without immediate complication. IMPRESSION: Successful bone marrow aspiration and core biopsy of the right posterior ilium. Note is made  of a relatively dry tap, and an additional core specimen was obtained. Electronically Signed   By: Olive Bass M.D.   On: 11/15/2022 10:30   CT ABDOMEN PELVIS W CONTRAST  Result Date: 11/13/2022 CLINICAL DATA:  uti, possible pyelo, continues to spike fevers EXAM: CT ABDOMEN AND PELVIS WITH  CONTRAST TECHNIQUE: Multidetector CT imaging of the abdomen and pelvis was performed using the standard protocol following bolus administration of intravenous contrast. RADIATION DOSE REDUCTION: This exam was performed according to the departmental dose-optimization program which includes automated exposure control, adjustment of the mA and/or kV according to patient size and/or use of iterative reconstruction technique. CONTRAST:  OMNIPAQUE IOHEXOL 300 MG/ML  SOLN COMPARISON:  CT 3 days ago 11/10/2022 FINDINGS: Lower chest: Trace bilateral pleural effusions and basilar atelectasis, right greater than left. Coronary artery calcifications/stents. Hepatobiliary: No suspicious hepatic lesion. Punctate granuloma in the subcapsular right lobe of the liver. Gallbladder is only minimally distended. No calcified gallstone or biliary dilatation. Pancreas: Parenchymal atrophy. No ductal dilatation or inflammation. Spleen: Normal in size without focal abnormality. Adrenals/Urinary Tract: No adrenal nodule. Heterogeneous enhancement of the upper pole of the right kidney persists, suspicious for focal pyelonephritis. There is no perirenal or intrarenal fluid collection. Mild enhancement of the right ureter and left renal collecting system. No renal calculi. Foley catheter decompresses the urinary bladder. There is circumferential bladder wall thickening. Mild residual perinephric fat stranding. Stomach/Bowel: No bowel obstruction or inflammation. Normal appendix. Moderate colonic stool burden. Vascular/Lymphatic: Aortic atherosclerosis. No aneurysm. No bulky abdominopelvic adenopathy. Reproductive: Enlarged prostate gland causing mass effect on the bladder base. Again seen clips in the prostate. Other: Small right greater than left fat containing inguinal hernias. Small fat containing umbilical hernia. No ascites or abdominopelvic collection. There is mild subcutaneous edema. Musculoskeletal: Stable osseous structures. The  bones appear under mineralized. Transitional lumbosacral anatomy with 4 lumbar type vertebra. Stable T7 and upper lumbar minimal compression deformities. No intramuscular collection. IMPRESSION: 1. Heterogeneous enhancement of the upper pole of the right kidney persists, suspicious for focal pyelonephritis. There is no perirenal or intrarenal fluid collection. 2. Mild enhancement of the right ureter and left renal collecting system, suspicious for ascending urinary tract infection. 3. Circumferential bladder wall thickening with Foley catheter in place. 4. Enlarged prostate gland causing mass effect on the bladder base. 5. Trace bilateral pleural effusions and basilar atelectasis, right greater than left. 6. Additional stable chronic findings as described. Aortic Atherosclerosis (ICD10-I70.0). Electronically Signed   By: Narda Rutherford M.D.   On: 11/13/2022 15:42   DG Chest Port 1 View  Result Date: 11/13/2022 CLINICAL DATA:  Cough EXAM: PORTABLE CHEST 1 VIEW COMPARISON:  07/31/2022 FINDINGS: The heart size and mediastinal contours are within normal limits. Low lung volumes with elevation of the right hemidiaphragm. No focal airspace consolidation, pleural effusion, or pneumothorax. Known bony metastatic lesions, better seen on previous PET-CT. IMPRESSION: No active disease. Electronically Signed   By: Duanne Guess D.O.   On: 11/13/2022 13:42   CT ABDOMEN PELVIS W CONTRAST  Result Date: 11/11/2022 CLINICAL DATA:  Acute abdominal pain.  Back pain.  Prostate cancer. EXAM: CT ABDOMEN AND PELVIS WITH CONTRAST TECHNIQUE: Multidetector CT imaging of the abdomen and pelvis was performed using the standard protocol following bolus administration of intravenous contrast. RADIATION DOSE REDUCTION: This exam was performed according to the departmental dose-optimization program which includes automated exposure control, adjustment of the mA and/or kV according to patient size and/or use of iterative reconstruction  technique. CONTRAST:  OMNIPAQUE IOHEXOL 300 MG/ML  SOLN COMPARISON:  CT renal stone 10/13/2022.  PET-CT 10/09/2022. FINDINGS: Lower chest: There is a trace right pleural effusion. Hepatobiliary: No focal liver abnormality is seen. No gallstones, gallbladder wall thickening, or biliary dilatation. Pancreas: Unremarkable. No pancreatic ductal dilatation or surrounding inflammatory changes. Spleen: Normal in size without focal abnormality. Adrenals/Urinary Tract: Foley catheter is seen within bladder. There is marked diffuse bladder wall thickening with mild surrounding inflammation. Small amount of air in the bladder is likely related to Foley catheter placement. There is no hydronephrosis or perinephric fluid. There is some ill-defined patchy areas of hypoattenuation in the superior pole the right kidney which may represent pyelonephritis. There is no urinary tract calculus. The adrenal glands are within normal limits. Stomach/Bowel: Stomach is within normal limits. Appendix appears normal. No evidence of bowel wall thickening, distention, or inflammatory changes. Vascular/Lymphatic: Aortic atherosclerosis. No enlarged abdominal or pelvic lymph nodes. Reproductive: Prostate gland is enlarged. Prostate radiotherapy seeds are present. Other: There is a small fat containing right inguinal hernia. There is a small umbilical hernia containing nondilated bowel. There is no ascites. Small subcutaneous nodules in the anterior right abdominal wall are unchanged. Musculoskeletal: There is mild new compression deformity of the superior endplate of L2. Mild compression deformity of the superior endplate of L1 appears stable. Degenerative changes affect the spine and hips. IMPRESSION: 1. Marked diffuse bladder wall thickening with surrounding inflammation worrisome for cystitis. 2. Patchy areas of hypoattenuation in the superior pole the right kidney may represent pyelonephritis. 3. New acute mild compression deformity of  the superior endplate of L2. 4. Trace right pleural effusion. Aortic Atherosclerosis (ICD10-I70.0). Electronically Signed   By: Darliss Cheney M.D.   On: 11/11/2022 00:02   CT Renal Stone Study  Result Date: 10/13/2022 CLINICAL DATA:  Hematuria.  History of prostate carcinoma. EXAM: CT ABDOMEN AND PELVIS WITHOUT CONTRAST TECHNIQUE: Multidetector CT imaging of the abdomen and pelvis was performed following the standard protocol without IV contrast. RADIATION DOSE REDUCTION: This exam was performed according to the departmental dose-optimization program which includes automated exposure control, adjustment of the mA and/or kV according to patient size and/or use of iterative reconstruction technique. COMPARISON:  09/20/2022.  PET scan on 10/09/2022. FINDINGS: Lower chest: No acute abnormality. Hepatobiliary: No focal liver abnormality is seen. Status post cholecystectomy. No biliary dilatation. Pancreas: Unremarkable. No pancreatic ductal dilatation or surrounding inflammatory changes. Spleen: Normal in size without focal abnormality. Adrenals/Urinary Tract: No adrenal masses. Bilateral hydronephrosis has resolved since the prior CT after decompression of the bladder. The bladder contains a Foley catheter. The bladder demonstrates diffuse circumferential wall thickening as well as some internal higher density material consistent with clot. Stomach/Bowel: Bowel shows no evidence of obstruction, ileus, inflammation or lesion. The appendix is normal. No free intraperitoneal air. Vascular/Lymphatic: No significant vascular findings are present. No enlarged abdominal or pelvic lymph nodes. Reproductive: Stable prostate enlargement. Other: Stable right inguinal hernia containing fat. Musculoskeletal: Bone lesions seen by PET scan are difficult to see by CT. There is suggestion of multiple very subtle lytic lesions in several lumbar vertebral bodies, the most conspicuous in L4. No pathologic fracture identified.  IMPRESSION: 1. Resolution of bilateral hydronephrosis after decompression of the bladder. The bladder contains a Foley catheter and demonstrates diffuse circumferential wall thickening as well as some internal higher density material consistent with clot. 2. Stable prostate enlargement. 3. Stable right inguinal hernia containing fat. 4. Bone lesions seen by PET scan are difficult to see by CT.  There is suggestion of multiple very subtle lytic lesions in several lumbar vertebral bodies, the most conspicuous in L4. No pathologic fracture identified. Electronically Signed   By: Irish Lack M.D.   On: 10/13/2022 17:31   NM PET (PSMA) SKULL TO MID THIGH  Result Date: 10/11/2022 CLINICAL DATA:  Follow-up prostate carcinoma. Assess response to treatment. EXAM: NUCLEAR MEDICINE PET SKULL BASE TO THIGH TECHNIQUE: 9.7 mCi F18 Piflufolastat (Pylarify) was injected intravenously. Full-ring PET imaging was performed from the skull base to thigh after the radiotracer. CT data was obtained and used for attenuation correction and anatomic localization. COMPARISON:  None Available. FINDINGS: NECK No radiotracer avid lymph nodes in the neck. Incidental CT finding: None. CHEST No radiotracer accumulation within mediastinal or hilar lymph nodes. No suspicious pulmonary nodules on the CT scan. Incidental CT finding: None. ABDOMEN/PELVIS Prostate: No focal activity in prostate gland. Foley catheter extends through the urethra. Lymph nodes: No abnormal radiotracer accumulation within pelvic or abdominal nodes. Liver: No evidence of liver metastasis. Incidental CT finding: Thickened urinary bladder wall with Foley catheter in place. SKELETON Multiple radiotracer avid lesions within the axillary appendicular skeleton. These lesions are intense and numerous and essentially occult by CT imaging. Lesions involve the pelvis, sacrum, every vertebral body level, ribs, shoulder girdles, and sternum. Lesions of also involve the proximal  femurs and humeri. Example lesion within the sternum with SUV max equal 33.1 on image 68. Example lesion in the posterior RIGHT iliac bone with SUV max equal 23.7. Multiple lesions in the proximal LEFT and RIGHT femur. Example cluster of lesions in the proximal RIGHT femur with SUV max equal 30 on image 7. There approximately 100 lesions. The number of lesions is increased dramatically from comparison PSMA scan IMPRESSION: 1. Innumerable radiotracer avid skeletal metastasis throughout the axial and appendicular skeleton. The lesions are essentially occult by CT imaging. The number of lesions is increased dramatically from comparison PSMA scan. 2. No evidence of visceral metastasis or nodal metastasis. Electronically Signed   By: Genevive Bi M.D.   On: 10/11/2022 14:50   CT HEAD WO CONTRAST ( )  Result Date: 09/21/2022 CLINICAL DATA:  Altered mental status EXAM: CT HEAD WITHOUT CONTRAST TECHNIQUE: Contiguous axial images were obtained from the base of the skull through the vertex without intravenous contrast. RADIATION DOSE REDUCTION: This exam was performed according to the departmental dose-optimization program which includes automated exposure control, adjustment of the mA and/or kV according to patient size and/or use of iterative reconstruction technique. COMPARISON:  CT head 07/06/15 FINDINGS: Brain: No hemorrhage. No hydrocephalus. No extra-axial fluid collection. No CT evidence of an acute cortical infarct. No mass effect. No mass lesion. There is mild overall chronic microvascular ischemic change. Vascular: No hyperdense vessel or unexpected calcification. Skull: Normal. Negative for fracture or focal lesion. Sinuses/Orbits: No middle ear or mastoid effusion. Paranasal sinuses are clear. Orbits are unremarkable. Other: None. IMPRESSION: No acute intracranial process. No specific etiology for altered mental status identified. Electronically Signed   By: Lorenza Cambridge M.D.   On: 09/21/2022 10:12    CT Abdomen Pelvis Wo Contrast  Result Date: 09/20/2022 CLINICAL DATA:  Symptomatic anemia. Clinical suspicion for retroperitoneal hemorrhage. EXAM: CT ABDOMEN AND PELVIS WITHOUT CONTRAST TECHNIQUE: Multidetector CT imaging of the abdomen and pelvis was performed following the standard protocol without IV contrast. RADIATION DOSE REDUCTION: This exam was performed according to the departmental dose-optimization program which includes automated exposure control, adjustment of the mA and/or kV according to patient size and/or use  of iterative reconstruction technique. COMPARISON:  05/13/2020 FINDINGS: Lower chest: No acute findings. Hepatobiliary: No mass visualized on this unenhanced exam. Gallbladder is unremarkable. No evidence of biliary ductal dilatation. Pancreas: No mass or inflammatory process visualized on this unenhanced exam. Spleen:  Within normal limits in size. Adrenals/Urinary tract: No evidence of urolithiasis. No suspicious renal masses identified. Moderate to severe bilateral hydroureteronephrosis is seen to the level of the bladder, which is new since previous study. No ureteral calculi or other obstructing etiology visualized. Distended urinary bladder noted with mild diffuse wall thickening. Stomach/Bowel: No evidence of obstruction, inflammatory process, or abnormal fluid collections. Normal appendix visualized. Moderate colonic stool burden noted. Vascular/Lymphatic: No pathologically enlarged lymph nodes identified. No evidence of abdominal aortic aneurysm. Aortic atherosclerotic calcification incidentally noted. Reproductive: Moderately enlarged prostate gland, without significant change. Other:  None.  No evidence of retroperitoneal hemorrhage. Musculoskeletal:  No suspicious bone lesions identified. IMPRESSION: No evidence of retroperitoneal hemorrhage. New moderate to severe bilateral hydroureteronephrosis to the level of the bladder. No ureteral calculi or other obstructing etiology  visualized. This is likely due to vesicoureteral reflux given distended urinary bladder. Distended urinary bladder with mild diffuse wall thickening, presumably due to chronic bladder outlet obstruction. Moderately enlarged prostate gland, without significant change. Moderate colonic stool burden; recommend correlation for symptoms or signs of constipation. Electronically Signed   By: Danae Orleans M.D.   On: 09/20/2022 12:49     Assessment and plan-   # Metastatic prostate cancer, with bone metastasis as well as bone marrow involvement. Severe anemia. He has progressed on Xtandi, not able to tolerate docetaxel chemotherapy. Next possible option is Pluvicto treatments, pending interventional radiology assessment.  He missed appointments due to being readmitted.  However he may not be a candidate for Pluvicto treatments due to severe anemia. Prognosis is poor.  I will reach out to IR Dr.Edmund to clarify whether he is a candidate for treatment or not.  He does not have much options left.  He is not a candidate for cabazitaxel.   Awaiting NGS/molecular testing to see if he has HRD or dMMR We discussed about the option of hospice/comfort care. Wife remains hopeful and is undecided.   #anemia, multifactorial due to CKD, marrow involvement of prostate cancer and recurrent infection.  Agree with PRBC transfusion to keep Hb>7 Epo therapy  -Epogen 30,000 unit x 1 -ordered.   # Hypercalcemia, improved with IVF. This maybe due to bone metastasis.  Recommend pamidronate 60mg  x1   #poor oral intake weight loss, recommend nutrition supplements.  Start on Megace and steroid.   Palliative care has been consulted.   Thank you for allowing me to participate in the care of this patient.   Rickard Patience, MD, PhD Hematology Oncology 11/21/2022

## 2022-11-21 NOTE — Plan of Care (Signed)
Neurology plan of care  MRI brain showed no acute or emergent findings (personal review). No further stroke workup indicated. EEG showed only mild diffuse slowing without epileptiform abnl. Patient has pyelonephritis and oncology and palliative care have both been consulted regarding his stage IV prostate cancer. Neurology will sign off but please re-engage if additional neurologic concerns arise.  Bing Neighbors, MD [facility] [phone]  If 7pm- 7am, please page neurology on call as listed in AMION.

## 2022-11-21 NOTE — Progress Notes (Signed)
    Durable Medical Equipment  (From admission, onward)           Start     Ordered   11/21/22 1223  For home use only DME Hospital bed  Once       Question Answer Comment  Length of Need Lifetime   Patient has (list medical condition): Stage IV prostate cancer, worsening weakness   The above medical condition requires: Patient requires the ability to reposition frequently   Head must be elevated greater than: 45 degrees   Bed type Semi-electric   [employer] Yes   Trapeze Bar Yes   Support Surface: Gel Overlay      11/21/22 1223           Darolyn Rua, [place], MSW, [place] [phone]

## 2022-11-21 NOTE — Assessment & Plan Note (Signed)
Patient has an history of anemia of chronic disease with CKD and advanced malignancy being followed up at cancer center.  Hemoglobin decreased to 7 this morning, no obvious bleeding.  Recent bone marrow with pancytopenia and 20% malignant cells.  Likely to have some dilutional effect with IV fluid resuscitation. -Ordered 1 unit of PRBC -Monitor hemoglobin

## 2022-11-21 NOTE — Progress Notes (Signed)
   11/21/22 1500  Spiritual Encounters  Type of Visit Follow up  Care provided to: Pt and family  Referral source Chaplain assessment  Reason for visit Routine spiritual support  OnCall Visit No  Spiritual Framework  Presenting Themes Coping tools;Caregiving needs  Community/Connection Family;Friend(s);Significant other;Faith community  Patient Stress Factors Health changes  Family Stress Factors None identified  Interventions  Spiritual Care Interventions Made Compassionate presence;Reflective listening;Decision-making support/facilitation;Self-care teaching  Intervention Outcomes  Outcomes Autonomy/agency;Awareness of health;Patient family open to resources  Spiritual Care Plan  Spiritual Care Issues Still Outstanding Lunette Stands will continue to follow   Chaplain followed up with patient and family to see how the patient was doing. Patient's wife shared with chaplain that the patients cancer has spread to his bone marrow and that the patient is waiting on a blood transfusion. Patient's wife spoke with their son and he is coming in town to check on his father and mother. She stated that they are putting their trust and faith in God and know that God's will , will be done. Chaplain will continue to follow up with patient and family.

## 2022-11-22 ENCOUNTER — Other Ambulatory Visit: Payer: Self-pay

## 2022-11-22 ENCOUNTER — Inpatient Hospital Stay: Payer: Medicare HMO

## 2022-11-22 ENCOUNTER — Encounter (HOSPITAL_COMMUNITY): Payer: Self-pay | Admitting: Oncology

## 2022-11-22 ENCOUNTER — Encounter: Payer: Self-pay | Admitting: Internal Medicine

## 2022-11-22 DIAGNOSIS — N189 Chronic kidney disease, unspecified: Secondary | ICD-10-CM | POA: Diagnosis not present

## 2022-11-22 DIAGNOSIS — R579 Shock, unspecified: Secondary | ICD-10-CM | POA: Diagnosis not present

## 2022-11-22 DIAGNOSIS — N179 Acute kidney failure, unspecified: Secondary | ICD-10-CM | POA: Diagnosis not present

## 2022-11-22 DIAGNOSIS — C61 Malignant neoplasm of prostate: Secondary | ICD-10-CM | POA: Diagnosis not present

## 2022-11-22 DIAGNOSIS — Z7189 Other specified counseling: Secondary | ICD-10-CM | POA: Diagnosis not present

## 2022-11-22 DIAGNOSIS — D631 Anemia in chronic kidney disease: Secondary | ICD-10-CM

## 2022-11-22 DIAGNOSIS — G9340 Encephalopathy, unspecified: Secondary | ICD-10-CM | POA: Diagnosis not present

## 2022-11-22 DIAGNOSIS — D649 Anemia, unspecified: Secondary | ICD-10-CM

## 2022-11-22 LAB — CBC
HCT: 25.2 % — ABNORMAL LOW (ref 39.0–52.0)
HCT: 16.1 % — ABNORMAL LOW (ref 39.0–52.0)
Hemoglobin: 8.3 g/dL — ABNORMAL LOW (ref 13.0–17.0)
Hemoglobin: 5.2 g/dL — ABNORMAL LOW (ref 13.0–17.0)
MCH: 27.4 pg (ref 26.0–34.0)
MCH: 27.8 pg (ref 26.0–34.0)
MCHC: 32.9 g/dL (ref 30.0–36.0)
MCHC: 32.3 g/dL (ref 30.0–36.0)
MCV: 83.2 fL (ref 80.0–100.0)
MCV: 86.1 fL (ref 80.0–100.0)
Platelets: 132 10*3/uL — ABNORMAL LOW (ref 150–400)
Platelets: 137 10*3/uL — ABNORMAL LOW (ref 150–400)
RBC: 3.03 MIL/uL — ABNORMAL LOW (ref 4.22–5.81)
RBC: 1.87 MIL/uL — ABNORMAL LOW (ref 4.22–5.81)
RDW: 17.1 % — ABNORMAL HIGH (ref 11.5–15.5)
RDW: 17.2 % — ABNORMAL HIGH (ref 11.5–15.5)
WBC: 5.4 10*3/uL (ref 4.0–10.5)
WBC: 8.1 10*3/uL (ref 4.0–10.5)
nRBC: 1.5 % — ABNORMAL HIGH (ref 0.0–0.2)
nRBC: 2.6 % — ABNORMAL HIGH (ref 0.0–0.2)

## 2022-11-22 LAB — COMPREHENSIVE METABOLIC PANEL
ALT: 19 U/L (ref 0–44)
ALT: 15 U/L (ref 0–44)
AST: 25 U/L (ref 15–41)
AST: 20 U/L (ref 15–41)
Albumin: 2.2 g/dL — ABNORMAL LOW (ref 3.5–5.0)
Albumin: 1.6 g/dL — ABNORMAL LOW (ref 3.5–5.0)
Alkaline Phosphatase: 61 U/L (ref 38–126)
Alkaline Phosphatase: 54 U/L (ref 38–126)
Anion gap: 5 (ref 5–15)
Anion gap: 7 (ref 5–15)
BUN: 26 mg/dL — ABNORMAL HIGH (ref 8–23)
BUN: 34 mg/dL — ABNORMAL HIGH (ref 8–23)
CO2: 28 mmol/L (ref 22–32)
CO2: 26 mmol/L (ref 22–32)
Calcium: 11.2 mg/dL — ABNORMAL HIGH (ref 8.9–10.3)
Calcium: 9.9 mg/dL (ref 8.9–10.3)
Chloride: 105 mmol/L (ref 98–111)
Chloride: 105 mmol/L (ref 98–111)
Creatinine, Ser: 1.27 mg/dL — ABNORMAL HIGH (ref 0.61–1.24)
Creatinine, Ser: 1.6 mg/dL — ABNORMAL HIGH (ref 0.61–1.24)
GFR, Estimated: 57 mL/min — ABNORMAL LOW (ref >60)
GFR, Estimated: 43 mL/min — ABNORMAL LOW (ref >60)
Glucose, Bld: 147 mg/dL — ABNORMAL HIGH (ref 70–99)
Glucose, Bld: 249 mg/dL — ABNORMAL HIGH (ref 70–99)
Potassium: 3.6 mmol/L (ref 3.5–5.1)
Potassium: 3.6 mmol/L (ref 3.5–5.1)
Sodium: 138 mmol/L (ref 135–145)
Sodium: 138 mmol/L (ref 135–145)
Total Bilirubin: 1 mg/dL (ref 0.3–1.2)
Total Bilirubin: 0.4 mg/dL (ref 0.3–1.2)
Total Protein: 5.8 g/dL — ABNORMAL LOW (ref 6.5–8.1)
Total Protein: 4.1 g/dL — ABNORMAL LOW (ref 6.5–8.1)

## 2022-11-22 LAB — BLOOD GAS, VENOUS
Acid-Base Excess: 1.2 mmol/L (ref 0.0–2.0)
Bicarbonate: 26.6 mmol/L (ref 20.0–28.0)
O2 Saturation: 54 %
Patient temperature: 37
pCO2, Ven: 44 mmHg (ref 44–60)
pH, Ven: 7.39 (ref 7.25–7.43)
pO2, Ven: 33 mmHg (ref 32–45)

## 2022-11-22 LAB — GLUCOSE, CAPILLARY
Glucose-Capillary: 115 mg/dL — ABNORMAL HIGH (ref 70–99)
Glucose-Capillary: 243 mg/dL — ABNORMAL HIGH (ref 70–99)
Glucose-Capillary: 216 mg/dL — ABNORMAL HIGH (ref 70–99)
Glucose-Capillary: 230 mg/dL — ABNORMAL HIGH (ref 70–99)
Glucose-Capillary: 212 mg/dL — ABNORMAL HIGH (ref 70–99)
Glucose-Capillary: 219 mg/dL — ABNORMAL HIGH (ref 70–99)

## 2022-11-22 LAB — HEMOGLOBIN AND HEMATOCRIT, BLOOD
HCT: 24.9 % — ABNORMAL LOW (ref 39.0–52.0)
Hemoglobin: 7.9 g/dL — ABNORMAL LOW (ref 13.0–17.0)

## 2022-11-22 LAB — PHOSPHORUS: Phosphorus: 4.1 mg/dL (ref 2.5–4.6)

## 2022-11-22 LAB — PROTIME-INR
INR: 1.6 — ABNORMAL HIGH (ref 0.8–1.2)
Prothrombin Time: 19 seconds — ABNORMAL HIGH (ref 11.4–15.2)

## 2022-11-22 LAB — MAGNESIUM: Magnesium: 1.6 mg/dL — ABNORMAL LOW (ref 1.7–2.4)

## 2022-11-22 LAB — PARATHYROID HORMONE, INTACT (NO CA): PTH: 4 pg/mL — ABNORMAL LOW (ref 15–65)

## 2022-11-22 LAB — LACTIC ACID, PLASMA: Lactic Acid, Venous: 1.7 mmol/L (ref 0.5–1.9)

## 2022-11-22 MED ORDER — SODIUM CHLORIDE 0.9 % IV SOLN
1.0000 g | Freq: Every day | INTRAVENOUS | Status: DC
  Administered 2022-11-22 – 2022-11-23 (×2): 1 g via INTRAVENOUS
  Filled 2022-11-22 (×2): qty 10

## 2022-11-22 MED ORDER — SODIUM CHLORIDE 0.9 % IV SOLN: 250.0000 mL | INTRAVENOUS | Status: DC

## 2022-11-22 MED ORDER — IOHEXOL 350 MG/ML SOLN
100.0000 mL | Freq: Once | INTRAVENOUS | Status: AC | PRN
  Administered 2022-11-22: 100 mL via INTRAVENOUS

## 2022-11-22 MED ORDER — SODIUM CHLORIDE 0.9 % IV BOLUS
1000.0000 mL | Freq: Once | INTRAVENOUS | Status: AC
  Administered 2022-11-22: 1000 mL via INTRAVENOUS

## 2022-11-22 MED ORDER — LOSARTAN POTASSIUM 50 MG PO TABS
100.0000 mg | ORAL_TABLET | Freq: Every day | ORAL | Status: DC
  Administered 2022-11-22: 100 mg via ORAL
  Filled 2022-11-22: qty 2

## 2022-11-22 MED ORDER — NOREPINEPHRINE 4 MG/250ML-% IV SOLN
0.0000 ug/min | INTRAVENOUS | Status: DC
  Administered 2022-11-22 – 2022-11-23 (×2): 10 ug/min via INTRAVENOUS
  Filled 2022-11-22 (×3): qty 250

## 2022-11-22 MED ORDER — NOREPINEPHRINE 4 MG/250ML-% IV SOLN
INTRAVENOUS | Status: AC
  Filled 2022-11-22: qty 250

## 2022-11-22 MED ORDER — SODIUM CHLORIDE 0.9 % IV BOLUS
1000.0000 mL | Freq: Once | INTRAVENOUS | Status: DC
  Administered 2022-11-22: 500 mL via INTRAVENOUS

## 2022-11-22 MED ORDER — METOPROLOL TARTRATE 50 MG PO TABS
50.0000 mg | ORAL_TABLET | Freq: Two times a day (BID) | ORAL | Status: DC
  Administered 2022-11-22: 50 mg via ORAL
  Filled 2022-11-22: qty 1

## 2022-11-22 MED ORDER — SODIUM CHLORIDE 0.9 % IV SOLN: INTRAVENOUS | Status: DC | PRN

## 2022-11-22 NOTE — Assessment & Plan Note (Addendum)
Multifactorial with advanced malignancy and involvement of bone marrow, being followed up at cancer center.  Recent bone marrow with pancytopenia and 20% malignant cells.  Hemoglobin at 8.3 after getting 1 unit of PRBC on 7/16.  Patient also received a dose of EPO by oncology on 7/16. Hemoglobin was found to be at 5.2 when rechecked during last night unresponsiveness and hypotension episode, no obvious bleeding.  Improved to 7.7 after getting 1 unit of PRBC Another decreased in Hgb requiring one more unit. Now comfort care only.

## 2022-11-22 NOTE — Procedures (Signed)
Central Venous Catheter Insertion Procedure Note  HERB BELTRE  [id number]  1940/08/11  Date:11/22/22  Time:11:16 PM   Provider Performing:[facility] L [facility]   Procedure: Insertion of Non-tunneled Central Venous Catheter(36556) with US guidance ([id number])   Indication(s) Medication administration and Difficult access  Consent Unable to obtain consent due to emergent nature of procedure.  Anesthesia Topical only with 1% lidocaine   Timeout Verified patient identification, verified procedure, site/side was marked, verified correct patient position, special equipment/implants available, medications/allergies/relevant history reviewed, required imaging and test results available.  Sterile Technique Maximal sterile technique including full sterile barrier drape, hand hygiene, sterile gown, sterile gloves, mask, hair covering, sterile ultrasound probe cover (if used).  Procedure Description Area of catheter insertion was cleaned with chlorhexidine and draped in sterile fashion.  With real-time ultrasound guidance a central venous catheter was placed into the left internal jugular vein. Nonpulsatile blood flow and easy flushing noted in all ports.  The catheter was sutured in place and sterile dressing applied.  Complications/Tolerance None; patient tolerated the procedure well. Chest X-ray is ordered to verify placement for internal jugular or subclavian cannulation.   Chest x-ray is not ordered for femoral cannulation.  EBL Minimal  Specimen(s) None  Betsey Holiday, AGACNP-BC Acute Care Nurse Practitioner [facility]   [phone] / [phone] Please see Amion for details.

## 2022-11-22 NOTE — Progress Notes (Addendum)
Pt BP at 56/33 MAP 40 HR 103 and 64/34/MAP 43 HR 102. Pt is clammy and open eyes when staff call pt name. MD Mansy made aware. Will contineu to monitor.  Update 2117: MD Mansy placed ordered 1 L 0.9 % sodium chloride bolus and instructed to hold metropolol at this time. Will continue to monitor.  Update 2125 RRR called, MD  Para March came at bedside and ordered 1 liter 0.9 % sodium chloride bolus and transfer pt to ICU.  Update 2154: Pt transferred to ICU and report given to [facility]. Will continue to monitor.

## 2022-11-22 NOTE — Consult Note (Addendum)
NAME:  Keith Howe, MRN:  [id number], DOB:  [birth year], LOS: 2 ADMISSION DATE:  11/18/2022, CONSULTATION DATE:  11/22/22 REFERRING MD:  Dr. Para March, CHIEF COMPLAINT:  Weakness   History of Present Illness:  82 yo M presenting to [facility] ED from the cancer center on 11/18/2022 for evaluation of weakness.  History provided per chart review and spouse report. Patient was in his normal state of health until he went to have lab work drawn at the cancer center on 11/23/2022. His wife was called back by staff who noted him to be minimally responsive, diaphoretic & hypotensive (63/45), CBG was stable at 200. Patient was recently discharged on 11/16/22 after an admission for pyelonephritis secondary to chronic foley catheter. Wife also reported fatigue and poor PO intake. No other complaints.   ED course: On arrival to the ED, patient's blood pressure had improved to 109/63 but did decrease as low as 95/50.  He was noted to be tachycardic with heart rate of 116.  He was afebrile at 98.1.  Initial workup demonstrated hemoglobin of 9.1, creatinine of 1.67, BUN 36 with GFR 41, calcium of 12.1, lactic acid of 2.6.  Troponin elevated at 24 with flat trend to 22.  INR elevated at 1.3.  Urinalysis demonstrated moderate leukocytes, rare bacteria with hematuria.  UDS negative.  CT head, CTA head/neck and MRI of the brain were obtained with no evidence of acute infarct or LVO.   Admitted for concern of sepsis secondary to UTI and started on cefepime.    ED course: Upon arrival patient's BP had improved to 109/63, he was mildly tachycardic and afebrile. Labs significant for hyperglycemia, hypercalcemia, hypoalbuminemia, mildly elevated but flat troponin, lactic acidosis without leukocytosis with baseline CKD 3b & anemia. UA +mod. Leuks, rare bacteria & hematuria. UDS negative. Patient worked up for stroke due to mentation changes. CTH, CTA head/neck and MRI brain obtained and negative for acute infarct or LVO. Patient admitted  by [facility] to PCU for concern of sepsis s/t UTI.  Hospital Course: Sepsis & acute CVA ruled out, EEG negative for seizure. Anti-hypertensive medication re-started 7/17 and overnight patient had a similar event to admission story where he became minimally responsive, hypotensive & diaphoretic- transferred to ICU and PCCM consulted. See significant events for more detail.  Recent Significant labs: (Labs/ Imaging personally reviewed) I, Cheryll Cockayne Rust-Chester, AGACNP-BC, personally viewed and interpreted this ECG. EKG Interpretation: Date: 11/22/2022, EKG Time: 22:10, Rate: 88, Rhythm: NSR, QRS Axis: Normal, Intervals: Normal, ST/T Wave abnormalities: Nonspecific T wave abnormalities, Narrative Interpretation: NSR Chemistry: Na+: 138, K+: 3.6, BUN/Cr.: 26/ 1.27, Serum CO2/ AG: 28/ 5, Ca: 11.2, albumin: 2.2 Hematology: WBC: 5.4, Hgb: 8.3, plt: 132  Troponin: 24 > 22, Lactic: 2.6 > 2.1, COVID-19 & Influenza A/B: negative  CXR 11/25/2022: no active disease. Low lung volumes CT head code stroke without contrast 12/03/2022: No acute finding by CT chronic small vessel ischemic changes of the cerebral hemispheric white matter.  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  Aspects is 10. CT angio head/neck code stroke 12/04/2022: Negative for LVO.  Generally mild for age atherosclerosis in the head and neck.  Right ICA bulb bulky calcified plaque with 50% stenosis.  Up to moderate stenosis of the nondominant left vertebral artery origin and mild stenosis of the supraclinoid right ICA stenosis are due to calcified plaque.  Small bilateral layering pleural effusions. MRI brain without contrast 12/04/2022: No acute finding by MRI, age-related volume loss, minimal white matter  changes less than usually seen at this age.  CT head without contrast 11/22/2022: No acute intracranial abnormalities.  Mild cerebral atrophy.  No significant change since previous study. CT angio chest 11/22/2022: No  pulmonary embolism.  Extensive multivessel CAD.  Moderate bilateral pleural effusions with bilateral dependent atelectasis.  Stable small calcified pleural plaques within the basilar pleural spaces bilaterally nonspecific.  Stable diffuse mixed lytic and sclerotic metastatic disease throughout the visualized axial skeleton in keeping with the patient's known metastatic prostate cancer.  Aortic atherosclerosis.  PCCM consulted for assistance in management and monitoring due to circulatory shock requiring vasopressor support.  Pertinent  Medical History  Stage IVb metastatic prostate cancer Chronic Iron deficiency Anemia  T2DM CKD stage 3b MGUS BPH HTN HOH with hearing aides  Significant Hospital Events: Including procedures, antibiotic start and stop dates in addition to other pertinent events   7/16; vitals stable, improving renal function, hemoglobin decreased to 7 but all cell lines decreased, likely some dilutional effect.  Recent bone marrow biopsy of at 20% malignant cells and pancytopenia-ordered 1 unit of PRBC after discussing with patient and family Palliative care from cancer center was also consulted due to grave prognosis with stage IV prostatic cancer with extensive bony mets. Preliminary blood cultures negative in 12 hours, urine cultures pending.  7/17: Urine and blood cultures negative.  EEG with generalized slowing and no epileptiform discharge, it was ordered in ED for concern of altered mental status.  Patient was on antibiotics for total of 14 days due to recent pyelonephritis, cefepime switched with ceftriaxone and we will complete that course, day 11 today.  Clinically stable, restarting home antihypertensives. Palliative care is on board and patient can be discharged home likely tomorrow once hospital bed is delivered.  Prognosis remained poor at this time. Patient was given a dose of pamidronate by oncology with some improvement in hypercalcemia. Overnight patient noted  to be suddenly minimally responsive and significantly hypotensive with SBP dropping from 130's to 50's. He was urgently transferred to ICU and placed on vasopressor support. PCCM consulted.  Interim History / Subjective:  Patient drowsy but responsive to voice, significantly hard of hearing without hearing aids present but able to interact and move all extremities.  BP on arrival to ICU still significantly hypotensive with SBP in the 50s.  IV fluid bolus initiated and Levophed drip initiated with emergent central line placement due to lack of stable access. Ongoing family updates, concerns answered throughout.  Objective   Blood pressure (!) 64/30, pulse 93, temperature 98 F (36.7 C), resp. rate 16, height 6\' 2"  (1.88 m), weight 100.3 kg, SpO2 99%.        Intake/Output Summary (Last 24 hours) at 11/22/2022 2202 Last data filed at 11/22/2022 1441 Gross per 24 hour  Intake --  Output 2225 ml  Net -2225 ml   Filed Weights   11/14/2022 2050  Weight: 100.3 kg    Examination: General: Adult male, critically ill, lying in bed, NAD HEENT: MM pink/moist, anicteric, atraumatic, neck supple, significantly HOH without hearing aids present Neuro: RASS -1, A&O x 1-2, able to follow simple persistent commands, PERRL +3, MAE CV: s1s2 RRR, NSR on monitor, no r/m/g Pulm: Regular, non labored on RA, breath sounds coarse-BUL & diminished-BLL GI: soft, rounded, non tender, bs x 4 GU: foley in place with clear yellow urine Skin: no rashes/lesions noted Extremities: warm/dry, pulses + 2 R/P, no edema noted  Resolved Hospital Problem list   Sepsis ruled out Acute CVA ruled  out  Assessment & Plan:  Circulatory shock suspect multifactorial in the setting of restarting antihypertensive medication, acute anemia & suspected vasovagal Hypoalbuminemia in the setting of poor PO intake PMHx: HTN Stat CTA ruled out PE, stat CBC shows acute anemia with hemoglobin drop to 5.2.  Believe this to be acute on  chronic anemia in the setting of metastatic prostate cancer as lactic has normalized.  Mentation has improved greatly once blood pressure stabilized.  Stat CT head negative. Stat labs significant for acute on chronic anemia with hemoglobin of 5.2, hypomagnesemia at 1.6, mildly elevated PCT of 0.29, elevated BNP of 442 & hypoalbuminemia at 1.6.  Otherwise lactic WNL, renal function slightly worse but still close to baseline & mild hyperglycemia. -Initiate Levophed drip, wean as tolerated to maintain MAP > 65 -Patient received 1.5 L IV fluid bolus, conservative fluids from this point on due to elevated BNP -Consider conservative albumin supplementation  -Echocardiogram ordered to rule out new onset CHF or valvular disease -Losartan and metoprolol discontinued  Moderate bilateral pleural effusions with bilateral dependent atelectasis -appear larger since admission.  May need to consider thoracentesis evaluation, patient currently asymptomatic on room air. - Aggressive pulmonary toileting -Continuous pulse oximetry monitoring -Supplemental oxygen PRN, to maintain SpO2 > 90%  Acute encephalopathy Concern for possible partial seizure Patient was minimally responsive with a GCS of 3, also described as staring fixedly for a prolonged period of time.  However this was all while significantly hypotensive with SBP in the 50s.  Mentation has improved greatly with stable blood pressure.  Will hold off on AED for now. -Monitor for any other signs of seizure activity, if mentation changes with stable BP consider initiating AED and repeating EEG  Acute on chronic normocytic anemia in the setting of Chronic Disease -2 units PRBCs ordered urgently, will check H&H between units to avoid fluid overload -Oncology managing EPO dosing - Monitor for s/s of bleeding - Daily CBC - Transfuse for Hgb <7  CKD Stage 3b Hypomagnesemia - Strict I/O's: alert provider if UOP < 0.5 mL/kg/hr - Daily BMP, replace electrolytes  PRN - Avoid nephrotoxic agents as able, ensure adequate renal perfusion - Mg supplementation ordered  Type 2 Diabetes Mellitus Risk for Steroid Induced Hyperglycemia Hemoglobin A1C: 6.8 (07/31/22) - Monitor CBG Q 4 hours - SSI moderate dosing, continue Semglee 12 units daily - target range while in ICU: 140-180 - follow ICU hyper/hypo-glycemia protocol  Stage IVb metastatic prostate cancer Extensive bony metastasis.  Recent bone marrow aspiration demonstrates presence of ~20% prostate mets.  Per recent oncology note patient progressed on Xtandi, and was not able to tolerate docetaxel chemotherapy.  He is pending IR evaluation for possible Pluvicto treatments, currently borderline candidate and will need to follow-up outpatient.  Other options limited -Continue Decadron daily -Continue Megace 80 mg twice daily -Palliative care and oncology are following, appreciate input  Hypercalcemia -improved to normal range and overnight labs Status post pamidronate dose -Oncology following, appreciate input -Avoid any calcium supplementation  Best Practice (right click and "Reselect all SmartList Selections" daily)  Diet/type: Regular consistency (see orders) DVT prophylaxis: LMWH GI prophylaxis: N/A Lines: Central line and yes and it is still needed Foley:  Yes, and it is still needed Code Status:  full code Last date of multidisciplinary goals of care discussion [11/22/22]  Labs   CBC: Recent Labs  Lab 11/16/22 0625 11/09/2022 1006 11/21/22 0530 11/22/22 0109 11/22/22 0450  WBC 3.4* 7.0 5.5  --  5.4  NEUTROABS  --  5.7  --   --   --   HGB 8.5* 9.1* 7.0* 7.9* 8.3*  HCT 26.1* 29.8* 21.7* 24.9* 25.2*  MCV 84.7 87.9 86.1  --  83.2  PLT 113* 171 138*  --  132*    Basic Metabolic Panel: Recent Labs  Lab 11/19/2022 1006 11/21/22 0613 11/21/22 0621 11/22/22 0450  NA 136  --  138 138  K 4.7  --  3.9 3.6  CL 100  --  102 105  CO2 25  --  26 28  GLUCOSE 222*  --  145* 147*  BUN 36*   --  30* 26*  CREATININE 1.67*  --  1.33* 1.27*  CALCIUM 12.1*  --  11.3* 11.2*  PHOS  --  3.9  --   --    GFR: Estimated Creatinine Clearance: 57.7 mL/min (A) (by C-G formula based on SCr of 1.27 mg/dL (H)). Recent Labs  Lab 11/16/22 0625 11/28/2022 1006 11/19/2022 1445 11/10/2022 2124 11/21/22 0530 11/22/22 0450  WBC 3.4* 7.0  --   --  5.5 5.4  LATICACIDVEN  --   --  2.6* 2.1*  --   --     Liver Function Tests: Recent Labs  Lab 11/18/2022 1006 11/22/22 0450  AST 25 25  ALT 23 19  ALKPHOS 83 61  BILITOT 0.7 1.0  PROT 7.0 5.8*  ALBUMIN 2.6* 2.2*   No results for input(s): "LIPASE", "AMYLASE" in the last 168 hours. No results for input(s): "AMMONIA" in the last 168 hours.  ABG    Component Value Date/Time   HCO3 19.1 (L) 09/21/2022 2308   ACIDBASEDEF 6.6 (H) 09/21/2022 2308   O2SAT 46.9 09/21/2022 2308     Coagulation Profile: Recent Labs  Lab 11/17/2022 1006 11/21/22 0621  INR 1.3* 1.3*    Cardiac Enzymes: Recent Labs  Lab 11/25/2022 1006  CKMB 3.0    HbA1C: HbA1c POC (<> result, manual entry)  Date/Time Value Ref Range Status  02/23/2021 09:11 AM 5.6 4.0 - 5.6 % Final   Hgb A1c MFr Bld  Date/Time Value Ref Range Status  07/31/2022 04:23 PM 6.8 (H) 4.8 - 5.6 % Final    Comment:    (NOTE)         Prediabetes: 5.7 - 6.4         Diabetes: >6.4         Glycemic control for adults with diabetes: <7.0   03/06/2018 01:47 PM 7.3 (H) 4.8 - 5.6 % Final    Comment:    (NOTE) Pre diabetes:          5.7%-6.4% Diabetes:              >6.4% Glycemic control for   <7.0% adults with diabetes     CBG: Recent Labs  Lab 11/22/22 0806 11/22/22 1330 11/22/22 1812 11/22/22 2057 11/22/22 2131  GLUCAP 115* 243* 216* 230* 212*    Review of Systems:   UTA- patient responsive but drowsy and extremely HOH without hearing aides present, unable to participate in interview at this time. Only current complaint is feeling cold.  Past Medical History:  He,  has a past  medical history of BPH (benign prostatic hyperplasia), Cancer (HCC), Chronic kidney disease, Diabetes mellitus without complication (HCC), HOH (hard of hearing), Hypertension, Inguinal hernia, Pain, and Prostate cancer (HCC).   Surgical History:   Past Surgical History:  Procedure Laterality Date   BACK SURGERY     Lumbar   CATARACT EXTRACTION W/PHACO Right 07/10/2017  Procedure: CATARACT EXTRACTION PHACO AND INTRAOCULAR LENS PLACEMENT (IOC);  Surgeon: Galen Manila, MD;  Location: [facility] ORS;  Service: Ophthalmology;  Laterality: Right;  [place] 00:29.2 AP% 16.5 CDE 4.83 Fluid Pack Lot # [id number]   COLONOSCOPY     EYE SURGERY Bilateral    cataract extraction   HERNIA REPAIR     INGUINAL   IR BONE MARROW BIOPSY & ASPIRATION  11/15/2022   PROSTATE BIOPSY N/A 04/08/2020   Procedure: PROSTATE BIOPSY Addison Bailey;  Surgeon: Orson Ape, MD;  Location: [facility] ORS;  Service: Urology;  Laterality: N/A;   TOTAL KNEE ARTHROPLASTY Left 03/19/2018   Procedure: TOTAL KNEE ARTHROPLASTY;  Surgeon: Juanell Fairly, MD;  Location: [facility] ORS;  Service: Orthopedics;  Laterality: Left;     Social History:   reports that he has never smoked. He has never been exposed to tobacco smoke. He has never used smokeless tobacco. He reports that he does not drink alcohol and does not use drugs.   Family History:  His family history includes Breast cancer in his sister; Diabetes in his father.   Allergies No Known Allergies   Home Medications  Prior to Admission medications   Medication Sig Start Date End Date Taking? Authorizing Provider  aspirin EC 81 MG tablet Take 81 mg by mouth daily.   Yes [provider]  cholecalciferol (VITAMIN D) 1000 units tablet Take 1,000 Units by mouth daily.   Yes [provider]  ciprofloxacin (CIPRO) 500 MG tablet Take 1 tablet (500 mg total) by mouth 2 (two) times daily for 20 doses. 11/16/22 11/25/2022 Yes Enedina Finner, MD  dapagliflozin propanediol (FARXIGA) 10 MG  TABS tablet Take 10 mg by mouth daily.   Yes [provider]  Iron-Vitamin C 65-125 MG TABS Take 1 tablet by mouth daily. 08/23/22  Yes Rickard Patience, MD  loratadine (CLARITIN) 10 MG tablet Take 10 mg by mouth daily as needed.   Yes [provider]  losartan (COZAAR) 100 MG tablet Take 100 mg by mouth daily. Hold for BP below 111.   Yes [provider]  lovastatin (MEVACOR) 40 MG tablet TAKE 1 TABLET BY MOUTH EVERY DAY 04/25/22  Yes Masoud, Renda Rolls, MD  metoprolol tartrate (LOPRESSOR) 50 MG tablet TAKE 1 TABLET BY MOUTH EVERY 12 HOURS Patient taking differently: Take 50 mg by mouth 2 (two) times daily. Hold for BP below 111. 11/25/21  Yes Masoud, Renda Rolls, MD  Misc Natural Products (PROSTATE SUPPORT PO) Take 2 capsules by mouth daily.   Yes [provider]  NOVOLOG FLEXPEN 100 UNIT/ML FlexPen Inject into the skin. Per sliding scale 01/03/18  Yes [provider]  Omega-3 Fatty Acids (FISH OIL PO) Take 1 capsule by mouth daily.    Yes [provider]  oxyCODONE (ROXICODONE) 5 MG immediate release tablet Take 1 tablet (5 mg total) by mouth every 8 (eight) hours as needed. 11/17/22 11/17/23 Yes Enedina Finner, MD  senna (SENOKOT) 8.6 MG TABS tablet Take 1 tablet (8.6 mg total) by mouth daily. 11/17/22  Yes Enedina Finner, MD  TRESIBA FLEXTOUCH 200 UNIT/ML FlexTouch Pen Inject 26 Units into the skin daily before breakfast. 11/16/22  Yes Enedina Finner, MD  TRULICITY 1.5 MG/0.5ML SOPN Inject 1.5 mg into the skin once a week. 08/10/22  Yes [provider]  vitamin B-12 (CYANOCOBALAMIN) 500 MCG tablet Take 500 mcg by mouth daily.   Yes [provider]  amLODipine (NORVASC) 5 MG tablet Take 5 mg by mouth daily. Patient not taking: Reported on  12/04/2022 11/16/22   [provider]     Critical care time: 67 minutes       Betsey Holiday, AGACNP-BC Acute Care Nurse Practitioner [facility]   [phone] /  [phone] Please see Amion for details.

## 2022-11-22 NOTE — Assessment & Plan Note (Addendum)
History of prostate cancer, stage IVb with extensive bony metastasis.  Recent bone marrow aspiration demonstrates presence of ~ 20% prostate mets. -Apparently has stopped chemo -Palliative care and oncology consult-patient has limited choices.  Apparently oncology referred him to IR for a different treatment, he might not be a candidate but need evaluation by them, apparently that can only be done as outpatient.  Patient continue to deteriorates, now DNR and comfort care.

## 2022-11-22 NOTE — Consult Note (Incomplete)
NAME:  Keith Howe, MRN:  [id number], DOB:  11/15/1940, LOS: 2 ADMISSION DATE:  11/20/2022, CONSULTATION DATE:  11/22/22 REFERRING MD:  Dr. Para March, CHIEF COMPLAINT:  Weakness   History of Present Illness:  82 yo M presenting to [facility] ED from the cancer center on 11/20/22 for evaluation of weakness.  History provided per chart review and spouse report. Patient was in his normal state of health until he went to have lab work drawn at the cancer center on 11/20/22. His wife was called back by staff who noted him to be minimally responsive, diaphoretic & hypotensive (63/45), CBG was stable at 200. Patient was recently discharged on 11/16/22 after an admission for pyelonephritis secondary to chronic foley catheter. Wife also reported fatigue and poor PO intake. No other complaints.   ED course: On arrival to the ED, patient's blood pressure had improved to 109/63 but did decrease as low as 95/50.  He was noted to be tachycardic with heart rate of 116.  He was afebrile at 98.1.  Initial workup demonstrated hemoglobin of 9.1, creatinine of 1.67, BUN 36 with GFR 41, calcium of 12.1, lactic acid of 2.6.  Troponin elevated at 24 with flat trend to 22.  INR elevated at 1.3.  Urinalysis demonstrated moderate leukocytes, rare bacteria with hematuria.  UDS negative.  CT head, CTA head/neck and MRI of the brain were obtained with no evidence of acute infarct or LVO.   Admitted for concern of sepsis secondary to UTI and started on cefepime.    ED course: Upon arrival patient's BP had improved to 109/63, he was mildly tachycardic and afebrile. Labs significant for hyperglycemia, hypercalcemia, hypoalbuminemia, mildly elevated but flat troponin, lactic acidosis without leukocytosis with baseline CKD 3b & anemia. UA +mod. Leuks, rare bacteria & hematuria. UDS negative. Patient worked up for stroke due to mentation changes. CTH, CTA head/neck and MRI brain obtained and negative for acute infarct or LVO. Patient admitted  by [facility] to PCU.  Hospital Course: Sepsis & acute CVA ruled out, EEG negative for seizure. Anti-hypertensive medication re-started 7/17 and overnight patient had a similar event to admission whSee significant events for more detail. Recent Significant labs: (Labs/ Imaging personally reviewed) I, Cheryll Cockayne Rust-Chester, AGACNP-BC, personally viewed and interpreted this ECG. EKG Interpretation: Date: ***, EKG Time: ***, Rate: ***, Rhythm: ***, QRS Axis:  *** Intervals: ***, ST/T Wave abnormalities: ***, Narrative Interpretation: *** Chemistry: Na+:***, K+: ***, BUN/Cr.: ***, Serum CO2/ AG: *** Hematology: WBC: ***, Hgb: ***,  Troponin: ***, BNP: ***, Lactic/ PCT: ***, COVID-19 & Influenza A/B: *** ABG: *** CXR ***: *** CT ***: ***  PCCM consulted for admission due to ***.  Pertinent  Medical History  Stage IVb metastatic prostate cancer Chronic Iron deficiency Anemia  T2DM CKD stage 3b MGUS BPH HTN HOH with hearing aides  Significant Hospital Events: Including procedures, antibiotic start and stop dates in addition to other pertinent events   7/16; vitals stable, improving renal function, hemoglobin decreased to 7 but all cell lines decreased, likely some dilutional effect.  Recent bone marrow biopsy of at 20% malignant cells and pancytopenia-ordered 1 unit of PRBC after discussing with patient and family Palliative care from cancer center was also consulted due to grave prognosis with stage IV prostatic cancer with extensive bony mets. Preliminary blood cultures negative in 12 hours, urine cultures pending.  7/17: Urine and blood cultures negative.  EEG with generalized slowing and no epileptiform discharge, it was ordered in ED for concern of altered  mental status.  Patient was on antibiotics for total of 14 days due to recent pyelonephritis, cefepime switched with ceftriaxone and we will complete that course, day 11 today.  Clinically stable, restarting home  antihypertensives. Palliative care is on board and patient can be discharged home likely tomorrow once hospital bed is delivered.  Prognosis remained poor at this time. Patient was given a dose of pamidronate by oncology with some improvement in hypercalcemia. Overnight patient noted to be suddenly minimally responsive and significantly hypotensive with SBP dropping from 130's to 50's. He was urgently transferred to ICU and placed on vasopressor support. PCCM consulted.  Interim History / Subjective:  ***  Objective   Blood pressure (!) 64/30, pulse 93, temperature 98 F (36.7 C), resp. rate 16, height 6\' 2"  (1.88 m), weight 100.3 kg, SpO2 99%.        Intake/Output Summary (Last 24 hours) at 11/22/2022 2202 Last data filed at 11/22/2022 1441 Gross per 24 hour  Intake --  Output 2225 ml  Net -2225 ml   Filed Weights   11/20/22 2050  Weight: 100.3 kg    Examination: General: *** HENT: *** Lungs: *** Cardiovascular: *** Abdomen: *** Extremities: *** Neuro: *** GU: ***  Resolved Hospital Problem list   ***  Assessment & Plan:  ***  Best Practice (right click and "Reselect all SmartList Selections" daily)   Diet/type: {diet type:25684} DVT prophylaxis: {anticoagulation (Optional):25687} GI prophylaxis: {UX:32440} Lines: {Central Venous Access:25771} Foley:  {Central Venous Access:25691} Code Status:  {Code Status:26939} Last date of multidisciplinary goals of care discussion [***]  Labs   CBC: Recent Labs  Lab 11/16/22 0625 11/20/22 1006 11/21/22 0530 11/22/22 0109 11/22/22 0450  WBC 3.4* 7.0 5.5  --  5.4  NEUTROABS  --  5.7  --   --   --   HGB 8.5* 9.1* 7.0* 7.9* 8.3*  HCT 26.1* 29.8* 21.7* 24.9* 25.2*  MCV 84.7 87.9 86.1  --  83.2  PLT 113* 171 138*  --  132*    Basic Metabolic Panel: Recent Labs  Lab 11/20/22 1006 11/21/22 0613 11/21/22 0621 11/22/22 0450  NA 136  --  138 138  K 4.7  --  3.9 3.6  CL 100  --  102 105  CO2 25  --  26 28   GLUCOSE 222*  --  145* 147*  BUN 36*  --  30* 26*  CREATININE 1.67*  --  1.33* 1.27*  CALCIUM 12.1*  --  11.3* 11.2*  PHOS  --  3.9  --   --    GFR: Estimated Creatinine Clearance: 57.7 mL/min (A) (by C-G formula based on SCr of 1.27 mg/dL (H)). Recent Labs  Lab 11/16/22 0625 11/20/22 1006 11/20/22 1445 11/20/22 2124 11/21/22 0530 11/22/22 0450  WBC 3.4* 7.0  --   --  5.5 5.4  LATICACIDVEN  --   --  2.6* 2.1*  --   --     Liver Function Tests: Recent Labs  Lab 11/20/22 1006 11/22/22 0450  AST 25 25  ALT 23 19  ALKPHOS 83 61  BILITOT 0.7 1.0  PROT 7.0 5.8*  ALBUMIN 2.6* 2.2*   No results for input(s): "LIPASE", "AMYLASE" in the last 168 hours. No results for input(s): "AMMONIA" in the last 168 hours.  ABG    Component Value Date/Time   HCO3 19.1 (L) 09/21/2022 2308   ACIDBASEDEF 6.6 (H) 09/21/2022 2308   O2SAT 46.9 09/21/2022 2308     Coagulation Profile: Recent Labs  Lab 11/20/22 1006 11/21/22 0621  INR 1.3* 1.3*    Cardiac Enzymes: Recent Labs  Lab 11/20/22 1006  CKMB 3.0    HbA1C: HbA1c POC (<> result, manual entry)  Date/Time Value Ref Range Status  02/23/2021 09:11 AM 5.6 4.0 - 5.6 % Final   Hgb A1c MFr Bld  Date/Time Value Ref Range Status  07/31/2022 04:23 PM 6.8 (H) 4.8 - 5.6 % Final    Comment:    (NOTE)         Prediabetes: 5.7 - 6.4         Diabetes: >6.4         Glycemic control for adults with diabetes: <7.0   03/06/2018 01:47 PM 7.3 (H) 4.8 - 5.6 % Final    Comment:    (NOTE) Pre diabetes:          5.7%-6.4% Diabetes:              >6.4% Glycemic control for   <7.0% adults with diabetes     CBG: Recent Labs  Lab 11/22/22 0806 11/22/22 1330 11/22/22 1812 11/22/22 2057 11/22/22 2131  GLUCAP 115* 243* 216* 230* 212*    Review of Systems:   ***  Past Medical History:  He,  has a past medical history of BPH (benign prostatic hyperplasia), Cancer (HCC), Chronic kidney disease, Diabetes mellitus without  complication (HCC), HOH (hard of hearing), Hypertension, Inguinal hernia, Pain, and Prostate cancer (HCC).   Surgical History:   Past Surgical History:  Procedure Laterality Date  . BACK SURGERY     Lumbar  . CATARACT EXTRACTION W/PHACO Right 07/10/2017   Procedure: CATARACT EXTRACTION PHACO AND INTRAOCULAR LENS PLACEMENT (IOC);  Surgeon: Galen Manila, MD;  Location: [facility] ORS;  Service: Ophthalmology;  Laterality: Right;  [place] 00:29.2 AP% 16.5 CDE 4.83 Fluid Pack Lot # [id number] H  . COLONOSCOPY    . EYE SURGERY Bilateral    cataract extraction  . HERNIA REPAIR     INGUINAL  . IR BONE MARROW BIOPSY & ASPIRATION  11/15/2022  . PROSTATE BIOPSY N/A 04/08/2020   Procedure: PROSTATE BIOPSY Addison Bailey;  Surgeon: Orson Ape, MD;  Location: [facility] ORS;  Service: Urology;  Laterality: N/A;  . TOTAL KNEE ARTHROPLASTY Left 03/19/2018   Procedure: TOTAL KNEE ARTHROPLASTY;  Surgeon: Juanell Fairly, MD;  Location: [facility] ORS;  Service: Orthopedics;  Laterality: Left;     Social History:   reports that he has never smoked. He has never been exposed to tobacco smoke. He has never used smokeless tobacco. He reports that he does not drink alcohol and does not use drugs.   Family History:  His family history includes Breast cancer in his sister; Diabetes in his father.   Allergies No Known Allergies   Home Medications  Prior to Admission medications   Medication Sig Start Date End Date Taking? Authorizing Provider  aspirin EC 81 MG tablet Take 81 mg by mouth daily.   Yes [provider]  cholecalciferol (VITAMIN D) 1000 units tablet Take 1,000 Units by mouth daily.   Yes [provider]  ciprofloxacin (CIPRO) 500 MG tablet Take 1 tablet (500 mg total) by mouth 2 (two) times daily for 20 doses. 11/16/22  Yes Enedina Finner, MD  dapagliflozin propanediol (FARXIGA) 10 MG TABS tablet Take 10 mg by mouth daily.   Yes [provider]  Iron-Vitamin C 65-125 MG TABS Take 1  tablet by mouth daily. 08/23/22  Yes Rickard Patience, MD  loratadine (CLARITIN) 10 MG tablet Take  10 mg by mouth daily as needed.   Yes [provider]  losartan (COZAAR) 100 MG tablet Take 100 mg by mouth daily. Hold for BP below 111.   Yes [provider]  lovastatin (MEVACOR) 40 MG tablet TAKE 1 TABLET BY MOUTH EVERY DAY 04/25/22  Yes Masoud, Renda Rolls, MD  metoprolol tartrate (LOPRESSOR) 50 MG tablet TAKE 1 TABLET BY MOUTH EVERY 12 HOURS Patient taking differently: Take 50 mg by mouth 2 (two) times daily. Hold for BP below 111. 11/25/21  Yes Masoud, Renda Rolls, MD  Misc Natural Products (PROSTATE SUPPORT PO) Take 2 capsules by mouth daily.   Yes [provider]  NOVOLOG FLEXPEN 100 UNIT/ML FlexPen Inject into the skin. Per sliding scale 01/03/18  Yes [provider]  Omega-3 Fatty Acids (FISH OIL PO) Take 1 capsule by mouth daily.    Yes [provider]  oxyCODONE (ROXICODONE) 5 MG immediate release tablet Take 1 tablet (5 mg total) by mouth every 8 (eight) hours as needed. 11/17/22 11/17/23 Yes Enedina Finner, MD  senna (SENOKOT) 8.6 MG TABS tablet Take 1 tablet (8.6 mg total) by mouth daily. 11/17/22  Yes Enedina Finner, MD  TRESIBA FLEXTOUCH 200 UNIT/ML FlexTouch Pen Inject 26 Units into the skin daily before breakfast. 11/16/22  Yes Enedina Finner, MD  TRULICITY 1.5 MG/0.5ML SOPN Inject 1.5 mg into the skin once a week. 08/10/22  Yes [provider]  vitamin B-12 (CYANOCOBALAMIN) 500 MCG tablet Take 500 mcg by mouth daily.   Yes [provider]  amLODipine (NORVASC) 5 MG tablet Take 5 mg by mouth daily. Patient not taking: Reported on 11/20/2022 11/16/22   [provider]     Critical care time: ***       Cheryll Cockayne Rust-Chester, AGACNP-BC Acute Care Nurse Practitioner [facility]   [phone] / [phone] Please see Amion for details.

## 2022-11-22 NOTE — Progress Notes (Signed)
Hematology/Oncology Progress note [phone] [phone]     Patient Care Team: Corky Downs, MD as PCP - General (Internal Medicine) Carmina Miller, MD as Consulting Physician (Radiation Oncology) Rickard Patience, MD as Consulting Physician (Oncology) Mady Haagensen, MD as Consulting Physician (Nephrology) Orson Ape, MD as Consulting Physician (Urology)   Name of the patient: Keith Howe  [id number]  February 19, 1941  Date of visit: 11/22/22   INTERVAL HISTORY-   No acute overnight events.  Wife and sister are at the bedside. Patient opens her eyes, but not engaging in any conversation with me.  No Known Allergies  Patient Active Problem List   Diagnosis Date Noted   Prostate cancer metastatic to bone (HCC) 07/13/2021    Priority: High   Hypotension 09/27/2022    Priority: Medium    Chemotherapy-induced neuropathy (HCC) 09/13/2022    Priority: Medium    Normocytic anemia 07/31/2022    Priority: Medium    Chronic kidney disease, stage 3a (HCC) 05/27/2020    Priority: Medium    Bilateral hydronephrosis 09/20/2022    Priority: Low   Weight loss 08/09/2022    Priority: Low   Chemotherapy induced neutropenia (HCC) 07/05/2022    Priority: Low   Encounter for antineoplastic chemotherapy 05/26/2022    Priority: Low   Goals of care, counseling/discussion 07/13/2021    Priority: Low   Type 2 diabetes mellitus without complication, with long-term current use of insulin (HCC) 10/13/2019    Priority: Low   AKI (acute kidney injury) (HCC) 11/21/2022   Sepsis (HCC) 11/20/2022   Hypercalcemia 11/20/2022   Altered mental status 11/20/2022   Benign prostatic hyperplasia 11/13/2022   Spinal stenosis 11/13/2022   Anemia due to bone marrow failure (HCC) 11/11/2022   Acute exacerbation of chronic low back pain 11/11/2022   Antineoplastic chemotherapy induced anemia 11/11/2022   Compression fracture of L2 lumbar vertebra, closed, initial encounter (HCC)  11/11/2022   Indwelling Foley catheter present 11/11/2022   Anemia in stage 3 chronic kidney disease (HCC) 09/21/2022   Chronic kidney disease, stage 3b (HCC) 09/21/2022   Leukocytosis 09/21/2022   Syncope 07/31/2022   SIRS (systemic inflammatory response syndrome) (HCC) 07/31/2022   Obesity (BMI 30-39.9) 07/31/2022   Uncontrolled type 2 diabetes mellitus with hyperglycemia, with long-term current use of insulin (HCC) 07/31/2022   Myocardial injury 07/31/2022   Bilateral leg edema 07/31/2022   Injury of quadriceps muscle 08/26/2020   Annual physical exam 02/18/2020   Need for influenza vaccination 02/18/2020   Essential hypertension 12/15/2019   Class 1 obesity with serious comorbidity and body mass index (BMI) of 31.0 to 31.9 in adult 10/13/2019   S/P TKR (total knee replacement) using cement, left 03/19/2018     Past Medical History:  Diagnosis Date   BPH (benign prostatic hyperplasia)    Cancer (HCC)    Chronic kidney disease    STAGE 3   Diabetes mellitus without complication (HCC)    HOH (hard of hearing)    AIDS   Hypertension    Inguinal hernia    Pain    CHRONIC LBP   Prostate cancer [facility])      Past Surgical History:  Procedure Laterality Date   BACK SURGERY     Lumbar   CATARACT EXTRACTION W/PHACO Right 07/10/2017   Procedure: CATARACT EXTRACTION PHACO AND INTRAOCULAR LENS PLACEMENT (IOC);  Surgeon: Galen Manila, MD;  Location: [facility] ORS;  Service: Ophthalmology;  Laterality: Right;  [place] 00:29.2 AP% 16.5 CDE 4.83 Fluid Pack Lot #  [id number]   COLONOSCOPY     EYE SURGERY Bilateral    cataract extraction   HERNIA REPAIR     INGUINAL   IR BONE MARROW BIOPSY & ASPIRATION  11/15/2022   PROSTATE BIOPSY N/A 04/08/2020   Procedure: PROSTATE BIOPSY Addison Bailey;  Surgeon: Orson Ape, MD;  Location: [facility] ORS;  Service: Urology;  Laterality: N/A;   TOTAL KNEE ARTHROPLASTY Left 03/19/2018   Procedure: TOTAL KNEE ARTHROPLASTY;  Surgeon: Juanell Fairly, MD;  Location:  [facility] ORS;  Service: Orthopedics;  Laterality: Left;    Social History   Socioeconomic History   Marital status: Married    Spouse name: Not on file   Number of children: Not on file   Years of education: Not on file   Highest education level: Some college, no degree  Occupational History   Not on file  Tobacco Use   Smoking status: Never    Passive exposure: Never   Smokeless tobacco: Never  Vaping Use   Vaping status: Never Used  Substance and Sexual Activity   Alcohol use: No   Drug use: Never   Sexual activity: Not Currently  Other Topics Concern   Not on file  Social History Narrative   Independent at baseline.  Lives at home with his wife   Social Determinants of Health   Financial Resource Strain: Low Risk  (05/12/2021)   Overall Financial Resource Strain (CARDIA)    Difficulty of Paying Living Expenses: Not hard at all  Food Insecurity: No Food Insecurity (11/20/2022)   Hunger Vital Sign    Worried About Running Out of Food in the Last Year: Never true    Ran Out of Food in the Last Year: Never true  Transportation Needs: No Transportation Needs (11/20/2022)   PRAPARE - Administrator, Civil Service (Medical): No    Lack of Transportation (Non-Medical): No  Physical Activity: Insufficiently Active (05/12/2021)   Exercise Vital Sign    Days of Exercise per Week: 7 days    Minutes of Exercise per Session: 10 min  Stress: No Stress Concern Present (05/12/2021)   [employer] of Occupational Health - Occupational Stress Questionnaire    Feeling of Stress : Not at all  Social Connections: Socially Integrated (05/12/2021)   Social Connection and Isolation Panel [NHANES]    Frequency of Communication with Friends and Family: More than three times a week    Frequency of Social Gatherings with Friends and Family: Once a week    Attends Religious Services: More than 4 times per year    Active Member of [employer] or Organizations: Yes    Attends Museum/gallery exhibitions officer: More than 4 times per year    Marital Status: Married  Catering manager Violence: Not At Risk (11/20/2022)   Humiliation, Afraid, Rape, and Kick questionnaire    Fear of Current or Ex-Partner: No    Emotionally Abused: No    Physically Abused: No    Sexually Abused: No     Family History  Problem Relation Age of Onset   Diabetes Father    Breast cancer Sister      Current Facility-Administered Medications:    0.9 %  sodium chloride infusion, , Intravenous, PRN, Arnetha Courser, MD, Stopped at 11/22/22 0248   acetaminophen (TYLENOL) tablet 650 mg, 650 mg, Oral, Q6H PRN **OR** acetaminophen (TYLENOL) suppository 650 mg, 650 mg, Rectal, Q6H PRN, Verdene Lennert, MD   aspirin chewable tablet 81 mg, 81 mg, Oral, Daily, 81 mg  at 11/22/22 0943 **OR** [DISCONTINUED] aspirin suppository 300 mg, 300 mg, Rectal, Daily, Jefferson Fuel, MD   cefTRIAXone (ROCEPHIN) 1 g in sodium chloride 0.9 % 100 mL IVPB, 1 g, Intravenous, Daily, Arnetha Courser, MD   Chlorhexidine Gluconate Cloth 2 % PADS 6 each, 6 each, Topical, Daily, Arnetha Courser, MD, 6 each at 11/21/22 1229   dexamethasone (DECADRON) tablet 0.5 mg, 0.5 mg, Oral, Daily, Rickard Patience, MD, 0.5 mg at 11/22/22 0943   enoxaparin (LOVENOX) injection 40 mg, 40 mg, Subcutaneous, Q24H, Verdene Lennert, MD, 40 mg at 11/21/22 2144   HYDROcodone-acetaminophen (NORCO/VICODIN) 5-325 MG per tablet 1-2 tablet, 1-2 tablet, Oral, Q6H PRN, Verdene Lennert, MD, 2 tablet at 11/21/22 0952   HYDROmorphone (DILAUDID) injection 1 mg, 1 mg, Intravenous, Q4H PRN, Arnetha Courser, MD   insulin aspart (novoLOG) injection 0-9 Units, 0-9 Units, Subcutaneous, TID WC, Verdene Lennert, MD, 2 Units at 11/21/22 1229   insulin glargine-yfgn (SEMGLEE) injection 12 Units, 12 Units, Subcutaneous, QHS, Verdene Lennert, MD, 12 Units at 11/21/22 2145   losartan (COZAAR) tablet 100 mg, 100 mg, Oral, Daily, Arnetha Courser, MD   megestrol (MEGACE) tablet 80 mg, 80 mg, Oral,  BID, Rickard Patience, MD, 80 mg at 11/22/22 0943   metoprolol tartrate (LOPRESSOR) tablet 50 mg, 50 mg, Oral, BID, Arnetha Courser, MD   ondansetron (ZOFRAN) tablet 4 mg, 4 mg, Oral, Q6H PRN **OR** ondansetron (ZOFRAN) injection 4 mg, 4 mg, Intravenous, Q6H PRN, Verdene Lennert, MD   pravastatin (PRAVACHOL) tablet 40 mg, 40 mg, Oral, q1800, Verdene Lennert, MD   senna (SENOKOT) tablet 8.6 mg, 1 tablet, Oral, Daily, Verdene Lennert, MD, 8.6 mg at 11/22/22 0943   sodium chloride flush (NS) 0.9 % injection 3 mL, 3 mL, Intravenous, Q12H, Verdene Lennert, MD, 3 mL at 11/21/22 2143   Physical exam:  Vitals:   11/21/22 2058 11/22/22 0423 11/22/22 0804 11/22/22 1007  BP: (!) 141/66 139/65 (!) 151/84 (!) 141/76  Pulse: (!) 109 (!) 109 (!) 116 (!) 109  Resp: 16 16 (!) 24 20  Temp: 98.2 F (36.8 C) 98.6 F (37 C) 98 F (36.7 C) 99 F (37.2 C)  TempSrc: Oral     SpO2: 98% 98% 99% 99%  Weight:      Height:       Physical Exam Constitutional:      General: He is not in acute distress.    Appearance: He is not diaphoretic.  HENT:     Head: Normocephalic and atraumatic.     Nose: Nose normal.  Eyes:     General: No scleral icterus. Cardiovascular:     Rate and Rhythm: Normal rate and regular rhythm.     Heart sounds: No murmur heard. Pulmonary:     Effort: Pulmonary effort is normal. No respiratory distress.  Abdominal:     General: There is no distension.     Palpations: Abdomen is soft.     Tenderness: There is no abdominal tenderness.  Musculoskeletal:        General: Normal range of motion.     Cervical back: Normal range of motion and neck supple.  Skin:    General: Skin is warm and dry.  Neurological:     Mental Status: He is alert.     Cranial Nerves: No cranial nerve deficit.     Motor: No abnormal muscle tone.     Comments: Lethargic  Psychiatric:        Mood and Affect: Affect normal.  Labs    Latest Ref Rng & Units 11/22/2022    4:50 AM 11/22/2022    1:09 AM  11/21/2022    5:30 AM  CBC  WBC 4.0 - 10.5 K/uL 5.4   5.5   Hemoglobin 13.0 - 17.0 g/dL 8.3  7.9  7.0   Hematocrit 39.0 - 52.0 % 25.2  24.9  21.7   Platelets 150 - 400 K/uL 132   138       Latest Ref Rng & Units 11/22/2022    4:50 AM 11/21/2022    6:21 AM 11/20/2022   10:06 AM  CMP  Glucose 70 - 99 mg/dL 161  096  045   BUN 8 - 23 mg/dL 26  30  36   Creatinine 0.61 - 1.24 mg/dL 4.09  8.11  9.14   Sodium 135 - 145 mmol/L 138  138  136   Potassium 3.5 - 5.1 mmol/L 3.6  3.9  4.7   Chloride 98 - 111 mmol/L 105  102  100   CO2 22 - 32 mmol/L 28  26  25    Calcium 8.9 - 10.3 mg/dL 78.2  95.6  21.3   Total Protein 6.5 - 8.1 g/dL 5.8   7.0   Total Bilirubin 0.3 - 1.2 mg/dL 1.0   0.7   Alkaline Phos 38 - 126 U/L 61   83   AST 15 - 41 U/L 25   25   ALT 0 - 44 U/L 19   23      RADIOGRAPHIC STUDIES: I have personally reviewed the radiological images as listed and agreed with the findings in the report. EEG adult  Result Date: 11/21/2022 Jefferson Fuel, MD     11/21/2022  2:13 PM Routine EEG Report TEHRAN RABENOLD is a 82 y.o. male with a history of altered mental status who is undergoing an EEG to evaluate for seizures. Report: This EEG was acquired with electrodes placed according to the International 10-20 electrode system (including Fp1, Fp2, F3, F4, C3, C4, P3, P4, O1, O2, T3, T4, T5, T6, A1, A2, Fz, Cz, Pz). The following electrodes were missing or displaced: none. The occipital dominant rhythm was 7 Hz. This activity is reactive to stimulation. Drowsiness was manifested by background fragmentation; deeper stages of sleep were identified by K complexes and sleep spindles. There was no focal slowing. There were no interictal epileptiform discharges. There were no electrographic seizures identified. Photic stimulation and hyperventilation were not performed. Impression and clinical correlation: This EEG was obtained while awake and asleep and is abnormal due to mild diffuse slowing indicative  of global cerebral dysfunction. Epileptiform abnormalities were not seen during this recording. Bing Neighbors, MD [facility] [phone] If 7pm- 7am, please page neurology on call as listed in AMION.   MR BRAIN W WO CONTRAST  Result Date: 11/20/2022 CLINICAL DATA:  Neuro deficit, acute, stroke suspected. Right worse than left sided weakness. EXAM: MRI HEAD WITHOUT AND WITH CONTRAST TECHNIQUE: Multiplanar, multiecho pulse sequences of the brain and surrounding structures were obtained without and with intravenous contrast. CONTRAST:  10mL GADAVIST GADOBUTROL 1 MMOL/ML IV SOLN COMPARISON:  CT studies same day. FINDINGS: Brain: Diffusion imaging does not show any acute or subacute infarction. No focal abnormality affects the brainstem or cerebellum. Cerebral hemispheres show age related volume loss without subjective lobar predominance. Patient shows only a very few punctate foci of T2 and FLAIR signal in the white matter, less than usually seen at this age. Dilated perivascular space  incidental at the base of the brain on the right. No cortical or large vessel territory infarction. No mass lesion, hemorrhage, hydrocephalus or extra-axial collection. After contrast administration, no abnormal enhancement occurs. Vascular: Major vessels at the base of the brain show flow. Skull and upper cervical spine: Negative Sinuses/Orbits: Clear/normal Other: None IMPRESSION: No acute finding by MRI. Age related volume loss. Minimal white matter changes, less than usually seen at this age. Electronically Signed   By: Paulina Fusi M.D.   On: 11/20/2022 13:11   DG Chest Port 1 View  Result Date: 11/20/2022 CLINICAL DATA:  Clammy and diaphoretic. EXAM: PORTABLE CHEST 1 VIEW COMPARISON:  November 13 2022 FINDINGS: Cardiomediastinal silhouette is normal. Mediastinal contours appear intact. There is no evidence of focal airspace consolidation, pleural effusion or pneumothorax. Low lung volumes. Osseous structures are  without acute abnormality. Soft tissues are grossly normal. IMPRESSION: No active disease. Low lung volumes. Electronically Signed   By: Ted Mcalpine M.D.   On: 11/20/2022 11:43   CT ANGIO HEAD NECK W WO CM (CODE STROKE)  Result Date: 11/20/2022 CLINICAL DATA:  82 year old male code stroke presentation. Hypotensive. Prostate cancer. EXAM: CT ANGIOGRAPHY HEAD AND NECK TECHNIQUE: Multidetector CT imaging of the head and neck was performed using the standard protocol during bolus administration of intravenous contrast. Multiplanar CT image reconstructions and MIPs were obtained to evaluate the vascular anatomy. Carotid stenosis measurements (when applicable) are obtained utilizing NASCET criteria, using the distal internal carotid diameter as the denominator. RADIATION DOSE REDUCTION: This exam was performed according to the departmental dose-optimization program which includes automated exposure control, adjustment of the mA and/or kV according to patient size and/or use of iterative reconstruction technique. CONTRAST:  75mL OMNIPAQUE IOHEXOL 350 MG/ML SOLN COMPARISON:  Plain head CT 1018 hours today. FINDINGS: CTA NECK Skeleton: Advanced cervical spine degeneration. No acute or suspicious osseous lesion identified. Upper chest: Small bilateral layering pleural effusions. Otherwise negative lung apices. Negative visible superior mediastinum. Other neck: Negative. Aortic arch: Mildly bovine arch configuration. Minimal arch atherosclerosis. Right carotid system: Negative brachiocephalic and right CCA. Mild soft and calcified plaque at the right ICA origin but bulky calcified plaque at the right ICA bulb. 50 % stenosis with respect to the distal vessel (series 5, image 103). Patent right ICA to the skull base. Left carotid system: Negative left CCA. Minimal calcified plaque at the left carotid bifurcation and no stenosis. Vertebral arteries: Proximal right subclavian artery and right vertebral artery origin  are normal. Right vertebral artery appears dominant and is patent to the skull base with no plaque or stenosis. Proximal left subclavian artery minimal plaque without stenosis. Calcified plaque at the left vertebral artery origin with mild to moderate stenosis on series 8, image 173. Non dominant left vertebral artery is patent to the skull base with no additional plaque or stenosis. CTA HEAD Posterior circulation: Patent distal vertebral arteries and vertebrobasilar junction with no significant plaque or stenosis. Patent PICA origins. Right V4 is dominant. Patent basilar artery, basilar tip, SCA and PCA origins without stenosis. Left posterior communicating artery is present, the right is diminutive or absent. Bilateral PCA branches are within normal limits. Anterior circulation: Both ICA siphons are patent. Left siphon mild calcified plaque with no significant stenosis. Normal left posterior communicating artery origin. Associated mild right supraclinoid segment stenosis. Patent carotid termini. Patent MCA and ACA origins. Tortuous A1 segments. Anterior communicating artery and bilateral ACA branches are within normal limits. Left MCA M1 segment and bifurcation are patent without  stenosis. Right MCA M1 segment and bifurcation are patent without stenosis. Bilateral MCA branches appear symmetric and within normal limits. Venous sinuses: Early contrast timing, not well evaluated. Anatomic variants: Dominant right vertebral artery. Review of the MIP images confirms the above findings IMPRESSION: 1. Negative for large vessel occlusion. Generally mild for age atherosclerosis in the head and neck. 2. Right ICA bulb bulky calcified plaque with 50% stenosis. Up to Moderate stenosis of the Non Dominant Left Vertebral Artery origin, and mild stenosis of the supraclinoid Right ICA stenosis are due to calcified plaque. 3. Small bilateral layering pleural effusions. Salient findings were communicated to Dr. Andree Elk at 10:38 am  on 11/20/2022 by text page via the [facility] messaging system. Electronically Signed   By: Odessa Fleming M.D.   On: 11/20/2022 10:38   CT HEAD CODE STROKE WO CONTRAST  Result Date: 11/20/2022 CLINICAL DATA:  Code stroke. Neuro deficit, acute, stroke suspected. EXAM: CT HEAD WITHOUT CONTRAST TECHNIQUE: Contiguous axial images were obtained from the base of the skull through the vertex without intravenous contrast. RADIATION DOSE REDUCTION: This exam was performed according to the departmental dose-optimization program which includes automated exposure control, adjustment of the mA and/or kV according to patient size and/or use of iterative reconstruction technique. COMPARISON:  09/21/2022 FINDINGS: Brain: No focal abnormality seen affecting the brainstem or cerebellum. There is an old right inferior basal ganglia infarction or dilated perivascular space. Chronic small-vessel ischemic changes affect the cerebral hemispheric white matter. No sign of acute infarction, mass lesion, hemorrhage, hydrocephalus or extra-axial collection. Vascular: There is atherosclerotic calcification of the major vessels at the base of the brain. Skull: Negative Sinuses/Orbits: Clear/normal Other: None ASPECTS (Alberta Stroke Program Early CT Score) - Ganglionic level infarction (caudate, lentiform nuclei, internal capsule, insula, M1-M3 cortex): 7 - Supraganglionic infarction (M4-M6 cortex): 3 Total score (0-10 with 10 being normal): 10 IMPRESSION: 1. No acute finding by CT. Chronic small-vessel ischemic changes of the cerebral hemispheric white matter. 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 2. Aspects is 10. These results were communicated to Dr. Selina Cooley at 10:22 am on 11/20/2022 by text page via the [facility] messaging system. Electronically Signed   By: Paulina Fusi M.D.   On: 11/20/2022 10:24   IR BONE MARROW BIOPSY & ASPIRATION  Result Date: 11/15/2022 INDICATION: Anemia EXAM: Bone marrow aspiration and core  biopsy using fluoroscopic guidance MEDICATIONS: None. ANESTHESIA/SEDATION: Moderate (conscious) sedation was employed during this procedure. A total of Versed 1 mg and Fentanyl 25 mcg was administered intravenously. Moderate Sedation Time: 10 minutes. The patient's level of consciousness and vital signs were monitored continuously by radiology nursing throughout the procedure under my direct supervision. FLUOROSCOPY TIME:  Fluoroscopy Time: 0.5 minutes (8 mGy) COMPLICATIONS: None immediate. PROCEDURE: Informed written consent was obtained from the patient after a thorough discussion of the procedural risks, benefits and alternatives. All questions were addressed. Maximal Sterile Barrier Technique was utilized including caps, mask, sterile gowns, sterile gloves, sterile drape, hand hygiene and skin antiseptic. A timeout was performed prior to the initiation of the procedure. The patient was placed prone on the exam table. Limited fluoroscopy of the pelvis was performed for planning purposes. Skin entry site was marked, and the overlying skin was prepped and draped in the standard sterile fashion. Local analgesia was obtained with 1% lidocaine. Using fluoroscopic guidance, an 11 gauge needle was advanced just deep to the cortex of the right posterior ilium. Subsequently, bone marrow aspiration and core biopsy were performed.  Due to relatively dry tap, a second core biopsy was obtained. Specimens were submitted to lab/pathology for handling. Hemostasis was achieved with manual pressure, and a clean dressing was placed. The patient tolerated the procedure well without immediate complication. IMPRESSION: Successful bone marrow aspiration and core biopsy of the right posterior ilium. Note is made of a relatively dry tap, and an additional core specimen was obtained. Electronically Signed   By: Olive Bass M.D.   On: 11/15/2022 10:30   CT ABDOMEN PELVIS W CONTRAST  Result Date: 11/13/2022 CLINICAL DATA:  uti,  possible pyelo, continues to spike fevers EXAM: CT ABDOMEN AND PELVIS WITH CONTRAST TECHNIQUE: Multidetector CT imaging of the abdomen and pelvis was performed using the standard protocol following bolus administration of intravenous contrast. RADIATION DOSE REDUCTION: This exam was performed according to the departmental dose-optimization program which includes automated exposure control, adjustment of the mA and/or kV according to patient size and/or use of iterative reconstruction technique. CONTRAST:  OMNIPAQUE IOHEXOL 300 MG/ML  SOLN COMPARISON:  CT 3 days ago 11/10/2022 FINDINGS: Lower chest: Trace bilateral pleural effusions and basilar atelectasis, right greater than left. Coronary artery calcifications/stents. Hepatobiliary: No suspicious hepatic lesion. Punctate granuloma in the subcapsular right lobe of the liver. Gallbladder is only minimally distended. No calcified gallstone or biliary dilatation. Pancreas: Parenchymal atrophy. No ductal dilatation or inflammation. Spleen: Normal in size without focal abnormality. Adrenals/Urinary Tract: No adrenal nodule. Heterogeneous enhancement of the upper pole of the right kidney persists, suspicious for focal pyelonephritis. There is no perirenal or intrarenal fluid collection. Mild enhancement of the right ureter and left renal collecting system. No renal calculi. Foley catheter decompresses the urinary bladder. There is circumferential bladder wall thickening. Mild residual perinephric fat stranding. Stomach/Bowel: No bowel obstruction or inflammation. Normal appendix. Moderate colonic stool burden. Vascular/Lymphatic: Aortic atherosclerosis. No aneurysm. No bulky abdominopelvic adenopathy. Reproductive: Enlarged prostate gland causing mass effect on the bladder base. Again seen clips in the prostate. Other: Small right greater than left fat containing inguinal hernias. Small fat containing umbilical hernia. No ascites or abdominopelvic collection. There  is mild subcutaneous edema. Musculoskeletal: Stable osseous structures. The bones appear under mineralized. Transitional lumbosacral anatomy with 4 lumbar type vertebra. Stable T7 and upper lumbar minimal compression deformities. No intramuscular collection. IMPRESSION: 1. Heterogeneous enhancement of the upper pole of the right kidney persists, suspicious for focal pyelonephritis. There is no perirenal or intrarenal fluid collection. 2. Mild enhancement of the right ureter and left renal collecting system, suspicious for ascending urinary tract infection. 3. Circumferential bladder wall thickening with Foley catheter in place. 4. Enlarged prostate gland causing mass effect on the bladder base. 5. Trace bilateral pleural effusions and basilar atelectasis, right greater than left. 6. Additional stable chronic findings as described. Aortic Atherosclerosis (ICD10-I70.0). Electronically Signed   By: Narda Rutherford M.D.   On: 11/13/2022 15:42   DG Chest Port 1 View  Result Date: 11/13/2022 CLINICAL DATA:  Cough EXAM: PORTABLE CHEST 1 VIEW COMPARISON:  07/31/2022 FINDINGS: The heart size and mediastinal contours are within normal limits. Low lung volumes with elevation of the right hemidiaphragm. No focal airspace consolidation, pleural effusion, or pneumothorax. Known bony metastatic lesions, better seen on previous PET-CT. IMPRESSION: No active disease. Electronically Signed   By: Duanne Guess D.O.   On: 11/13/2022 13:42   CT ABDOMEN PELVIS W CONTRAST  Result Date: 11/11/2022 CLINICAL DATA:  Acute abdominal pain.  Back pain.  Prostate cancer. EXAM: CT ABDOMEN AND PELVIS WITH CONTRAST TECHNIQUE: Multidetector  CT imaging of the abdomen and pelvis was performed using the standard protocol following bolus administration of intravenous contrast. RADIATION DOSE REDUCTION: This exam was performed according to the departmental dose-optimization program which includes automated exposure control, adjustment of the mA  and/or kV according to patient size and/or use of iterative reconstruction technique. CONTRAST:  OMNIPAQUE IOHEXOL 300 MG/ML  SOLN COMPARISON:  CT renal stone 10/13/2022.  PET-CT 10/09/2022. FINDINGS: Lower chest: There is a trace right pleural effusion. Hepatobiliary: No focal liver abnormality is seen. No gallstones, gallbladder wall thickening, or biliary dilatation. Pancreas: Unremarkable. No pancreatic ductal dilatation or surrounding inflammatory changes. Spleen: Normal in size without focal abnormality. Adrenals/Urinary Tract: Foley catheter is seen within bladder. There is marked diffuse bladder wall thickening with mild surrounding inflammation. Small amount of air in the bladder is likely related to Foley catheter placement. There is no hydronephrosis or perinephric fluid. There is some ill-defined patchy areas of hypoattenuation in the superior pole the right kidney which may represent pyelonephritis. There is no urinary tract calculus. The adrenal glands are within normal limits. Stomach/Bowel: Stomach is within normal limits. Appendix appears normal. No evidence of bowel wall thickening, distention, or inflammatory changes. Vascular/Lymphatic: Aortic atherosclerosis. No enlarged abdominal or pelvic lymph nodes. Reproductive: Prostate gland is enlarged. Prostate radiotherapy seeds are present. Other: There is a small fat containing right inguinal hernia. There is a small umbilical hernia containing nondilated bowel. There is no ascites. Small subcutaneous nodules in the anterior right abdominal wall are unchanged. Musculoskeletal: There is mild new compression deformity of the superior endplate of L2. Mild compression deformity of the superior endplate of L1 appears stable. Degenerative changes affect the spine and hips. IMPRESSION: 1. Marked diffuse bladder wall thickening with surrounding inflammation worrisome for cystitis. 2. Patchy areas of hypoattenuation in the superior pole the right kidney  may represent pyelonephritis. 3. New acute mild compression deformity of the superior endplate of L2. 4. Trace right pleural effusion. Aortic Atherosclerosis (ICD10-I70.0). Electronically Signed   By: Darliss Cheney M.D.   On: 11/11/2022 00:02    Assessment and plan-   # Metastatic prostate cancer, with bone metastasis as well as bone marrow involvement. Severe anemia. He has progressed on Xtandi, not able to tolerate docetaxel chemotherapy. Next possible option is Pluvicto treatments, pending interventional radiology assessment.  He missed appointments due to being readmitted.  Discussed with Dr.Edmunds who thinks that he is a borderline candidate.  Patient will need to reschedule his appointment after discharge for initial assessment with Dr..Edmunds His remaining options are limited Awaiting NGS/molecular testing to see if he has HRD or dMMR- We discussed about the option of hospice/comfort care. Wife remains hopeful and would like to seek additional options. I have added Megace 80 mg twice daily as well as dexamethasone 0.5 mg daily.  Please continue at discharge.   #anemia, multifactorial due to CKD, marrow involvement of prostate cancer and recurrent infection.  Agree with PRBC transfusion to keep Hb>7 Epo therapy  -Epogen 30,000 unit x 1 given on 11/21/2022. Discussed with pathology, adding NGS to the bone marrow specimen for further evaluation   # Hypercalcemia, improved with IVF. This maybe due to bone metastasis.  Status post pamidronate 60mg  x1  Avoid calcium supplementation.   Thank you for allowing me to participate in the care of this patient.   Rickard Patience, MD, PhD Hematology Oncology 11/22/2022

## 2022-11-22 NOTE — Assessment & Plan Note (Addendum)
New significant hypercalcemia of 12.1, that corrects to 13.2 on admission, potentially secondary to bony metastasis.  Improved to 12.6 s/p IV fluid and pamidronate Parathyroid hormone appropriately low. -Avoid any calcium supplement -Continue to monitor

## 2022-11-22 NOTE — Assessment & Plan Note (Addendum)
Transient altered mental status when patient was hypotensive, with patient having no recollection of the event.  CVA has been ruled out with CT head, CTA of the head/neck and MRI.  EEG with generalized slowing, no epileptiform changes  Patient is currently back to baseline.  No further intervention indicated.  7/18: Had another episode of becoming altered and unresponsive in the setting of hypotension.  Has been improved and repeat CT head was negative for any acute abnormality.  7/19.Patient currently unresposive.

## 2022-11-22 NOTE — Progress Notes (Addendum)
   Rapid Response  CROSS COVER NOTE  NAME: Keith Howe MRN: [id number] DOB : December 31, 1940    Concern as stated by nurse / staff   Overhead rapid response  Hypotension and decreased responsiveness   Initial assessment Upon my arrival, rapid response team at bedside, family in hallway Rapid report by bedside nurse: Patient minimally responsive whereas he was talking earlier in the day, hypotensive with systolic in the 60s, blood sugar in the 200s, had a staring episode.  Minimal eye-opening with deep sternal rub. Cardiac monitor: Sinus rhythm in the 70s, normal O2 sats     11/22/2022    9:43 PM 11/22/2022    9:36 PM 11/22/2022    9:33 PM  Vitals with BMI  Systolic 64 64 75  Diastolic 30 35 40  Physical Exam Vitals and nursing note reviewed.  Constitutional:      General: He is not in acute distress.    Comments: Patient minimally responsive with sternal rub  HENT:     Head: Normocephalic and atraumatic.  Cardiovascular:     Rate and Rhythm: Normal rate and regular rhythm.     Heart sounds: Normal heart sounds.  Pulmonary:     Effort: Pulmonary effort is normal.     Breath sounds: Normal breath sounds.  Abdominal:     Palpations: Abdomen is soft.     Tenderness: There is no abdominal tenderness.  Neurological:     General: No focal deficit present.     GCS: GCS eye subscore is 1. GCS verbal subscore is 1. GCS motor subscore is 1.     Comments: Staring episode lasting up to a minute noticed during evaluation concerning for seizure.  During episode patient was briefly tachycardic and tachypneic       Pertinent findings on chart review -Stage IV metastatic cancer with extensive metastases sent from cancer center on 7/15 due to altered mental status and hypotension during infusion. - Came in as code stroke ruled out with CT, CTA head and neck and MRI - Ruled out for sepsis with negative cultures and restarted on antihypertensives on 7/17 - EEG on 7/17 showed generalized  slowing -Had palliative care consult earlier today and was a full code   Assessment Circulatory shock Acute metabolic encephalopathy-unresponsiveness GCS 3 Possible seizure Concern for acute PE   Intervention 1-Start IV fluid bolus x 2 L 2-Transfer to stepdown for pressors 3-Discussion with sister, who spoke with wife on phone in hallway regarding CODE STATUS which remain a full code 4-ICU consulted and updated and discussed plan for CTA chest to evaluate for possible PE and repeat CT head 5-discussed additional workup with ICU NP, Cheryll Cockayne    CRITICAL CARE Performed by: Andris Baumann   Total critical care time: 75 minutes  Critical care time was exclusive of separately billable procedures and treating other patients.  Critical care was necessary to treat or prevent imminent or life-threatening deterioration.  Critical care was time spent personally by me on the following activities: development of treatment plan with patient and/or surrogate as well as nursing, discussions with consultants, evaluation of patient's response to treatment, examination of patient, obtaining history from patient or surrogate, ordering and performing treatments and interventions, ordering and review of laboratory studies, ordering and review of radiographic studies, pulse oximetry and re-evaluation of patient's condition.

## 2022-11-22 NOTE — Progress Notes (Signed)
Rapid Response Event Note   Reason for Call:  Hypotension / Altered Mental Status  Initial Focused Assessment:   Patient lying in bed, skin cool and diaphoretic, and he difficult to arouse. When awakened appears to stare blankly and does not easily follow staff commands. Blood pressure low.  Interventions:    - Dr. Para March at bedside to assess patient. - Initiated IV fluids. - Assessment and placement of additional PIVs.  Plan of Care:   - Fluid bolus - CT scan - Transfer to stepdown level of care - Intensivist consult  Event Summary:   MD Notified: 2127 Call Time: 2127 Arrival Time: 2129 End Time: 2155  Carmel Sacramento, RN

## 2022-11-22 NOTE — Progress Notes (Signed)
Progress Note   Patient: Keith Howe ZOX:[id number] DOB: June 19, 1940 DOA: 11/20/2022     2 DOS: the patient was seen and examined on 11/22/2022   Brief hospital course: Taken from H&P.  Keith Howe is a 82 y.o. 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  Per wife patient with worsening fatigue, generalized malaise and poor p.o. intake.  New left flank pain, previously he had right flank pain during most recent admission. Patient was noted to have decreased responsiveness and blood pressure of 63/45 noted at oncology center and was sent to ED.  ED course: On arrival to the ED, patient's blood pressure had improved to 109/63 but did decrease as low as 95/50.  He was noted to be tachycardic with heart rate of 116.  He was afebrile at 98.1.  Initial workup demonstrated hemoglobin of 9.1, creatinine of 1.67, BUN 36 with GFR 41, calcium of 12.1, lactic acid of 2.6.  Troponin elevated at 24 with flat trend to 22.  INR elevated at 1.3.  Urinalysis demonstrated moderate leukocytes, rare bacteria with hematuria.  UDS negative.  CT head, CTA head/neck and MRI of the brain were obtained with no evidence of acute infarct or LVO.  Admitted for concern of sepsis secondary to UTI and started on cefepime.  7/16; vitals stable, improving renal function, hemoglobin decreased to 7 but all cell lines decreased, likely some dilutional effect.  Recent bone marrow biopsy of at 20% malignant cells and pancytopenia-ordered 1 unit of PRBC after discussing with patient and family  Palliative care from cancer center was also consulted due to grave prognosis with stage IV prostatic cancer with extensive bony mets. Preliminary blood cultures negative in 12 hours, urine cultures pending.  7/17: Urine and blood  cultures negative.  EEG with generalized slowing and no epileptiform discharge, it was ordered in ED for concern of altered mental status.  Patient was on antibiotics for total of 14 days due to recent pyelonephritis, cefepime switched with ceftriaxone and we will complete that course, day 11 today.  Clinically stable, restarting home antihypertensives. Palliative care is on board and patient can be discharged home likely tomorrow once hospital bed is delivered.  Prognosis remained poor at this time. Patient was given a dose of pamidronate by oncology with some improvement in hypercalcemia.    Assessment and Plan: * Sepsis (HCC) Patient is presenting with altered mental status, hypotension, tachycardia and tachypnea and elevated lactic acid.  Urinalysis is concerning with either reinfection versus refractory infection given he was just admitted for sepsis secondary to pyelonephritis. Left CVA tenderness noted today. Culture data on most recent admission demonstrated Serratia, which is known for creating biofilm. Sepsis ruled out as cultures remain negative. -Cefepime switched with ceftriaxone to complete a prior 14-day course. - Blood and urine cultures remain negative - Foley catheter has been exchanged in the ED  Hypercalcemia New significant hypercalcemia of 12.1, that corrects to 13.2.  Potentially secondary to bony metastasis.  Improved to 12.6 s/p IV fluid and pamidronate Parathyroid hormone appropriately low. -Avoid any calcium supplement -Continue to monitor  Prostate cancer metastatic to bone [facility]) History of prostate cancer, stage IVb with extensive bony metastasis.  Recent bone marrow aspiration demonstrates presence of ~ 20% prostate mets. -Apparently has stopped chemo -Palliative care and oncology consult-patient has  limited choices.  Apparently oncology referred him to IR for a different treatment, he might not be a candidate but need evaluation by them.  Altered mental  status Transient altered mental status when patient was hypotensive, with patient having no recollection of the event.  CVA has been ruled out with CT head, CTA of the head/neck and MRI.  EEG with generalized slowing, no epileptiform changes  Patient is currently back to baseline.  No further intervention indicated.  Normocytic anemia Multifactorial with advanced malignancy and involvement of bone marrow, being followed up at cancer center.  Recent bone marrow with pancytopenia and 20% malignant cells.  Hemoglobin at 8.3 after getting 1 unit of PRBC yesterday.  Patient also received a dose of EPO by oncology yesterday. -Monitor hemoglobin -Transfuse if below 7  Chronic kidney disease, stage 3b (HCC) Per chart review, patient's creatinine has ranged from 1.3-1.7 over the last 3 years.  Currently 1.67>>1.33, however it was 1.22 approximately 6 days ago.  - IV fluids as ordered - Continue to monitor renal function while admitted  Anemia in chronic kidney disease History of anemia secondary to CKD and bone marrow suppression in the setting of bone metastasis.  Hemoglobin is stable at this time.  - CBC in the a.m.  Uncontrolled type 2 diabetes mellitus with hyperglycemia, with long-term current use of insulin (HCC) - Hold home antiglycemic agents - SSI, sensitive - Semglee 12 units at bedtime - May need to consider discontinuing [place] +/- Trulicity in the long-term given patient has poor p.o. intake, recurrent hypotension, etc  Essential hypertension Blood pressure started trending up. -Restart home antihypertensives   Subjective: Patient was lying down comfortably when seen today.  Just open eyes and said no when asked about pain.  Not much engaging with conversation.  Physical Exam: Vitals:   11/22/22 0423 11/22/22 0804 11/22/22 1007 11/22/22 1305  BP: 139/65 (!) 151/84 (!) 141/76 (!) 141/67  Pulse: (!) 109 (!) 116 (!) 109 (!) 115  Resp: 16 (!) 24 20 20   Temp: 98.6 F (37 C) 98  F (36.7 C) 99 F (37.2 C) 99.1 F (37.3 C)  TempSrc:      SpO2: 98% 99% 99% 99%  Weight:      Height:       General.  Ill-appearing elderly man, in no acute distress. Pulmonary.  Lungs clear bilaterally, normal respiratory effort. CV.  Regular rate and rhythm, no JVD, rub or murmur. Abdomen.  Soft, nontender, nondistended, BS positive. CNS.  Alert and oriented .  No focal neurologic deficit. Extremities.  No edema, no cyanosis, pulses intact and symmetrical.   Data Reviewed: Prior data reviewed  Family Communication: Discussed with wife and sister at bedside  Disposition: Status is: Inpatient Remains inpatient appropriate because: Severity of illness  Planned Discharge Destination: Home  DVT prophylaxis.  Lovenox Time spent: 45 minutes  This record has been created using Conservation officer, historic buildings. Errors have been sought and corrected,but may not always be located. Such creation errors do not reflect on the standard of care.   Author: Arnetha Courser, MD 11/22/2022 2:13 PM  For on call review [email or web address].

## 2022-11-22 NOTE — Progress Notes (Signed)
Daily Progress Note   Patient Name: Keith Howe       Date: 11/22/2022 DOB: Aug 12, 1940  Age: 82 y.o. MRN#: [id number] Attending Physician: Keith Courser, MD Primary Care Physician: Keith Downs, MD Admit Date: 11/20/2022  Reason for Consultation/Follow-up: Establishing goals of care  Subjective: Notes and labs reviewed. In to see patient. He is currently resting in bed, no family at bedside.  He answers questions that are asked with minimal to no elaboration. He does tell me that he has spoken with oncology, and is considering plans to follow-up with radiation oncology outpatient.  He confirms understanding of hospice care as well.  Will follow-up tomorrow based on conversation.  Length of Stay: 2  Current Medications: Scheduled Meds:   aspirin  81 mg Oral Daily   Chlorhexidine Gluconate Cloth  6 each Topical Daily   dexamethasone  0.5 mg Oral Daily   enoxaparin (LOVENOX) injection  40 mg Subcutaneous Q24H   insulin aspart  0-9 Units Subcutaneous TID WC   insulin glargine-yfgn  12 Units Subcutaneous QHS   losartan  100 mg Oral Daily   megestrol  80 mg Oral BID   metoprolol tartrate  50 mg Oral BID   pravastatin  40 mg Oral q1800   senna  1 tablet Oral Daily   sodium chloride flush  3 mL Intravenous Q12H    Continuous Infusions:  sodium chloride Stopped (11/22/22 0248)   cefTRIAXone (ROCEPHIN)  IV 1 g (11/22/22 1349)    PRN Meds: sodium chloride, acetaminophen **OR** acetaminophen, HYDROcodone-acetaminophen, HYDROmorphone (DILAUDID) injection, ondansetron **OR** ondansetron (ZOFRAN) IV  Physical Exam Pulmonary:     Effort: Pulmonary effort is normal.  Neurological:     Mental Status: He is alert.             Vital Signs: BP (!) 141/67 (BP Location: Left Arm)   Pulse  (!) 115   Temp 99.1 F (37.3 C)   Resp 20   Ht 6\' 2"  (1.88 m)   Wt 100.3 kg   SpO2 99%   BMI 28.39 kg/m  SpO2: SpO2: 99 % O2 Device: O2 Device: Room Air O2 Flow Rate:    Intake/output summary:  Intake/Output Summary (Last 24 hours) at 11/22/2022 1415 Last data filed at 11/22/2022 0600 Gross per 24 hour  Intake 1024.5 ml  Output  1250 ml  Net -225.5 ml   LBM: Last BM Date : 11/21/22 Baseline Weight: Weight: 100.3 kg Most recent weight: Weight: 100.3 kg    Patient Active Problem List   Diagnosis Date Noted   AKI (acute kidney injury) (HCC) 11/21/2022   Sepsis (HCC) 11/20/2022   Hypercalcemia 11/20/2022   Altered mental status 11/20/2022   Benign prostatic hyperplasia 11/13/2022   Spinal stenosis 11/13/2022   Anemia due to bone marrow failure (HCC) 11/11/2022   Acute exacerbation of chronic low back pain 11/11/2022   Antineoplastic chemotherapy induced anemia 11/11/2022   Compression fracture of L2 lumbar vertebra, closed, initial encounter (HCC) 11/11/2022   Indwelling Foley catheter present 11/11/2022   Hypotension 09/27/2022   Anemia in chronic kidney disease 09/21/2022   Chronic kidney disease, stage 3b (HCC) 09/21/2022   Leukocytosis 09/21/2022   Bilateral hydronephrosis 09/20/2022   Chemotherapy-induced neuropathy (HCC) 09/13/2022   Weight loss 08/09/2022   Syncope 07/31/2022   SIRS (systemic inflammatory response syndrome) (HCC) 07/31/2022   Obesity (BMI 30-39.9) 07/31/2022   Uncontrolled type 2 diabetes mellitus with hyperglycemia, with long-term current use of insulin (HCC) 07/31/2022   Myocardial injury 07/31/2022   Bilateral leg edema 07/31/2022   Normocytic anemia 07/31/2022   Chemotherapy induced neutropenia (HCC) 07/05/2022   Encounter for antineoplastic chemotherapy 05/26/2022   Prostate cancer metastatic to bone (HCC) 07/13/2021   Goals of care, counseling/discussion 07/13/2021   Injury of quadriceps muscle 08/26/2020   Chronic kidney disease,  stage 3a (HCC) 05/27/2020   Annual physical exam 02/18/2020   Need for influenza vaccination 02/18/2020   Essential hypertension 12/15/2019   Class 1 obesity with serious comorbidity and body mass index (BMI) of 31.0 to 31.9 in adult 10/13/2019   Type 2 diabetes mellitus without complication, with long-term current use of insulin (HCC) 10/13/2019   S/P TKR (total knee replacement) using cement, left 03/19/2018    Palliative Care Assessment & Plan    Recommendations/Plan: Patient considering plans to follow-up with radiation oncology outpatient.  Code Status:    Code Status Orders  (From admission, onward)           Start     Ordered   11/20/22 1824  Full code  Continuous       Question:  By:  Answer:  Consent: discussion documented in EHR   11/20/22 1825           Code Status History     Date Active Date Inactive Code Status Order ID Comments User Context   11/11/2022 0034 11/16/2022 2107 Full Code [id number]  Andris Baumann, MD ED   09/21/2022 1113 09/26/2022 1907 Full Code [id number]  Lorretta Harp, MD ED   07/31/2022 1524 08/02/2022 1832 Full Code [id number]  Lorretta Harp, MD ED   03/19/2018 1246 03/24/2018 1814 Full Code [id number]  Juanell Fairly, MD Inpatient       Prognosis: Poor   Thank you for allowing the Palliative Medicine Team to assist in the care of this patient.  Keith Stall, NP  Please contact Palliative Medicine Team phone at [phone] for questions and concerns.

## 2022-11-22 NOTE — Progress Notes (Signed)
2200: Pt refused the blood draw for H and H post 1 unit of PRBC. Explained to the pt the importance of checking his hemoglobin after the transfusion but he still refused it. Dr. Arville Care made aware via secure chat.

## 2022-11-22 NOTE — IPAL (Signed)
  Interdisciplinary Goals of Care Family Meeting   Date carried out: 11/22/2022  Location of the meeting: Bedside  Member's involved: Physician, Bedside Registered Nurse, and Family Member or next of kin--sister at bedside, wife on phone  Durable Power of Attorney or acting medical decision maker: wife and sister    Discussion: We discussed goals of care for Keith Howe .   I have reviewed medical records including EPIC notes, labs and imaging, assessed the patient and then met with sister to discuss major active diagnoses, plan of care, natural trajectory, prognosis, GOC, EOL wishes, disposition and options including Full code/DNI/DNR and the concept of comfort care if DNR is elected. Questions and concerns were addressed.  Sister explained that patient has only 1 child and he lives in [place] and they would like everything done to keep him alive until the son is able to visit and see him . Election for full code status.   Code status:   Code Status: Full Code   Disposition: Continue current acute care  Time spent for the meeting: 30    Andris Baumann, MD  11/22/2022, 10:22 PM

## 2022-11-22 NOTE — Progress Notes (Signed)
   11/22/22 2100  Spiritual Encounters  Type of Visit Initial  Care provided to: Pt and family  Referral source Trauma page  Reason for visit Urgent spiritual support  OnCall Visit Yes  Spiritual Framework  Community/Connection Family;Significant other  Patient Stress Factors Health changes  Family Stress Factors None identified  Interventions  Spiritual Care Interventions Made Compassionate presence  Spiritual Care Plan  Spiritual Care Issues Still Outstanding Chaplain will continue to follow   Responded to code page patient was being seen by Medical team. Spoke to patient sister she said she was fine. I waited for medical team to finish their assessment before I left sister side. Advise her that Chaplain services is available if needed.

## 2022-11-22 NOTE — Assessment & Plan Note (Addendum)
Patient is presenting with altered mental status, hypotension, tachycardia and tachypnea and elevated lactic acid.  Urinalysis is concerning with either reinfection versus refractory infection given he was just admitted for sepsis secondary to pyelonephritis. Left CVA tenderness noted today. Culture data on most recent admission demonstrated Serratia, which is known for creating biofilm. Sepsis ruled out as cultures remain negative. -Cefepime switched with ceftriaxone to complete a prior 14-day course. - Blood and urine cultures remain negative - Foley catheter has been exchanged in the ED  Patient is being transitioned to comfort care now.

## 2022-11-22 NOTE — Assessment & Plan Note (Addendum)
Patient had another episode of profound hypotension, requiring pressors. Very labile BP. Patient is now comfort care.

## 2022-11-22 NOTE — Plan of Care (Signed)
  Problem: Clinical Measurements: Goal: Diagnostic test results will improve Outcome: Progressing Goal: Signs and symptoms of infection will decrease Outcome: Progressing   Problem: Clinical Measurements: Goal: Signs and symptoms of infection will decrease Outcome: Progressing   Problem: Respiratory: Goal: Ability to maintain adequate ventilation will improve Outcome: Progressing   Problem: Fluid Volume: Goal: Ability to maintain a balanced intake and output will improve Outcome: Progressing

## 2022-11-23 ENCOUNTER — Inpatient Hospital Stay: Payer: Medicare HMO

## 2022-11-23 ENCOUNTER — Inpatient Hospital Stay (HOSPITAL_COMMUNITY)
Admit: 2022-11-23 | Discharge: 2022-11-23 | Disposition: A | Discharge status: Home / Self Care | Payer: Medicare HMO | Attending: Pulmonary Disease | Admitting: Pulmonary Disease

## 2022-11-23 ENCOUNTER — Inpatient Hospital Stay: Payer: Medicare HMO | Admitting: Oncology

## 2022-11-23 ENCOUNTER — Telehealth: Payer: Self-pay

## 2022-11-23 ENCOUNTER — Other Ambulatory Visit: Payer: Self-pay

## 2022-11-23 DIAGNOSIS — Z7189 Other specified counseling: Secondary | ICD-10-CM | POA: Diagnosis not present

## 2022-11-23 DIAGNOSIS — R4182 Altered mental status, unspecified: Secondary | ICD-10-CM | POA: Diagnosis present

## 2022-11-23 DIAGNOSIS — R55 Syncope and collapse: Secondary | ICD-10-CM | POA: Diagnosis not present

## 2022-11-23 DIAGNOSIS — R404 Transient alteration of awareness: Secondary | ICD-10-CM | POA: Diagnosis not present

## 2022-11-23 DIAGNOSIS — A419 Sepsis, unspecified organism: Secondary | ICD-10-CM | POA: Diagnosis not present

## 2022-11-23 DIAGNOSIS — R570 Cardiogenic shock: Secondary | ICD-10-CM

## 2022-11-23 DIAGNOSIS — C61 Malignant neoplasm of prostate: Secondary | ICD-10-CM | POA: Diagnosis present

## 2022-11-23 LAB — HEMOGLOBIN AND HEMATOCRIT, BLOOD
HCT: 19.1 % — ABNORMAL LOW (ref 39.0–52.0)
Hemoglobin: 6.3 g/dL — ABNORMAL LOW (ref 13.0–17.0)

## 2022-11-23 LAB — GLUCOSE, CAPILLARY
Glucose-Capillary: 203 mg/dL — ABNORMAL HIGH (ref 70–99)
Glucose-Capillary: 226 mg/dL — ABNORMAL HIGH (ref 70–99)
Glucose-Capillary: 230 mg/dL — ABNORMAL HIGH (ref 70–99)
Glucose-Capillary: 250 mg/dL — ABNORMAL HIGH (ref 70–99)
Glucose-Capillary: 267 mg/dL — ABNORMAL HIGH (ref 70–99)
Glucose-Capillary: 281 mg/dL — ABNORMAL HIGH (ref 70–99)
Glucose-Capillary: 288 mg/dL — ABNORMAL HIGH (ref 70–99)
Glucose-Capillary: 297 mg/dL — ABNORMAL HIGH (ref 70–99)

## 2022-11-23 LAB — ECHOCARDIOGRAM COMPLETE
AR max vel: 2.57 cm2
AV Area VTI: 2.25 cm2
AV Area mean vel: 2.04 cm2
AV Mean grad: 5 mmHg
AV Peak grad: 8.2 mmHg
Ao pk vel: 1.43 m/s
Area-P 1/2: 3.99 cm2
Height: 74 in
MV VTI: 3.16 cm2
S' Lateral: 1.7 cm
Weight: 3537.94 oz

## 2022-11-23 LAB — CBC
HCT: 23 % — ABNORMAL LOW (ref 39.0–52.0)
Hemoglobin: 7.7 g/dL — ABNORMAL LOW (ref 13.0–17.0)
MCH: 28.1 pg (ref 26.0–34.0)
MCHC: 33.5 g/dL (ref 30.0–36.0)
MCV: 83.9 fL (ref 80.0–100.0)
Platelets: 155 10*3/uL (ref 150–400)
RBC: 2.74 MIL/uL — ABNORMAL LOW (ref 4.22–5.81)
RDW: 17 % — ABNORMAL HIGH (ref 11.5–15.5)
WBC: 10.6 10*3/uL — ABNORMAL HIGH (ref 4.0–10.5)
nRBC: 4.9 % — ABNORMAL HIGH (ref 0.0–0.2)

## 2022-11-23 LAB — CORTISOL: Cortisol, Plasma: 24.4 ug/dL

## 2022-11-23 LAB — C-REACTIVE PROTEIN: CRP: 13.8 mg/dL — ABNORMAL HIGH (ref <1.0)

## 2022-11-23 LAB — MRSA NEXT GEN BY PCR, NASAL: MRSA by PCR Next Gen: NOT DETECTED

## 2022-11-23 LAB — PROCALCITONIN: Procalcitonin: 0.29 ng/mL

## 2022-11-23 LAB — TROPONIN I (HIGH SENSITIVITY): Troponin I (High Sensitivity): 44 ng/L — ABNORMAL HIGH (ref <18)

## 2022-11-23 LAB — PREPARE RBC (CROSSMATCH)

## 2022-11-23 LAB — BRAIN NATRIURETIC PEPTIDE: B Natriuretic Peptide: 442.2 pg/mL — ABNORMAL HIGH (ref 0.0–100.0)

## 2022-11-23 MED ORDER — INSULIN ASPART 100 UNIT/ML IJ SOLN
0.0000 [IU] | INTRAMUSCULAR | Status: DC
  Administered 2022-11-23: 8 [IU] via SUBCUTANEOUS
  Administered 2022-11-23 (×2): 5 [IU] via SUBCUTANEOUS
  Administered 2022-11-23: 8 [IU] via SUBCUTANEOUS
  Administered 2022-11-23: 5 [IU] via SUBCUTANEOUS
  Filled 2022-11-23 (×5): qty 1

## 2022-11-23 MED ORDER — PANTOPRAZOLE SODIUM 40 MG IV SOLR
40.0000 mg | Freq: Two times a day (BID) | INTRAVENOUS | Status: DC
  Administered 2022-11-23 – 2022-11-24 (×2): 40 mg via INTRAVENOUS
  Filled 2022-11-23 (×2): qty 10

## 2022-11-23 MED ORDER — INSULIN ASPART 100 UNIT/ML IJ SOLN
0.0000 [IU] | INTRAMUSCULAR | Status: DC
  Administered 2022-11-23: 11 [IU] via SUBCUTANEOUS
  Administered 2022-11-23: 7 [IU] via SUBCUTANEOUS
  Administered 2022-11-24 (×2): 4 [IU] via SUBCUTANEOUS
  Administered 2022-11-24: 5 [IU] via SUBCUTANEOUS
  Filled 2022-11-23 (×5): qty 1

## 2022-11-23 MED ORDER — PHENYLEPHRINE HCL-NACL 20-0.9 MG/250ML-% IV SOLN
0.0000 ug/min | INTRAVENOUS | Status: DC
  Filled 2022-11-23: qty 250

## 2022-11-23 MED ORDER — MAGNESIUM SULFATE 2 GM/50ML IV SOLN
2.0000 g | Freq: Once | INTRAVENOUS | Status: AC
  Administered 2022-11-23: 2 g via INTRAVENOUS
  Filled 2022-11-23: qty 50

## 2022-11-23 MED ORDER — INSULIN ASPART 100 UNIT/ML IJ SOLN
3.0000 [IU] | Freq: Once | INTRAMUSCULAR | Status: AC
  Administered 2022-11-23: 3 [IU] via SUBCUTANEOUS
  Filled 2022-11-23: qty 1

## 2022-11-23 MED ORDER — HYDROCORTISONE SOD SUC (PF) 100 MG IJ SOLR
50.0000 mg | Freq: Four times a day (QID) | INTRAMUSCULAR | Status: DC
  Administered 2022-11-23 – 2022-11-24 (×4): 50 mg via INTRAVENOUS
  Filled 2022-11-23 (×2): qty 1
  Filled 2022-11-23 (×2): qty 2
  Filled 2022-11-23: qty 1
  Filled 2022-11-23: qty 2
  Filled 2022-11-23: qty 1
  Filled 2022-11-23 (×2): qty 2

## 2022-11-23 MED ORDER — SODIUM CHLORIDE 0.9 % IV SOLN
2.0000 g | Freq: Every day | INTRAVENOUS | Status: DC
  Administered 2022-11-24: 2 g via INTRAVENOUS
  Filled 2022-11-23 (×2): qty 20

## 2022-11-23 MED ORDER — SODIUM CHLORIDE 0.9% IV SOLUTION: Freq: Once | INTRAVENOUS | Status: DC

## 2022-11-23 MED ORDER — SODIUM CHLORIDE 0.9% IV SOLUTION: Freq: Once | INTRAVENOUS | Status: AC

## 2022-11-23 MED ORDER — SODIUM CHLORIDE 0.9 % IV BOLUS: 500.0000 mL | Freq: Once | INTRAVENOUS | Status: DC

## 2022-11-23 NOTE — Inpatient Diabetes Management (Signed)
Inpatient Diabetes Program Recommendations  AACE/ADA: New Consensus Statement on Inpatient Glycemic Control (2015)  Target Ranges:  Prepandial:   less than 140 mg/dL      Peak postprandial:   less than 180 mg/dL (1-2 hours)      Critically ill patients:  140 - 180 mg/dL   Lab Results  Component Value Date   GLUCAP 297 (H) 11/23/2022   HGBA1C 6.8 (H) 07/31/2022    Review of Glycemic Control  Latest Reference Range & Units 11/22/22 08:06 11/22/22 13:30 11/22/22 18:12 11/22/22 20:57 11/22/22 21:31 11/22/22 22:04 11/23/22 00:26 11/23/22 03:35 11/23/22 08:04  Glucose-Capillary 70 - 99 mg/dL 213 (H) 086 (H) 578 (H) 230 (H) 212 (H) 219 (H) 226 (H) 281 (H) 297 (H)   Diabetes history: DM 2 Outpatient Diabetes medications:  Farxiga 10 mg Daily, Novolog SSI, Tresiba 26 units Daily, Trulicity 1.5 mg weekly Current orders for Inpatient glycemic control:  Novolog 0-15 units Q4 hours Semglee 12 units qhs  Decadron 0.5 mg Daily  Inpatient Diabetes Program Recommendations:    -  Add Novolog 3 units tid  meal coverage if eating >50% of meals  Thanks,  Christena Deem RN, MSN, BC-ADM Inpatient Diabetes Coordinator Team Pager [phone] (8a-5p)

## 2022-11-23 NOTE — Progress Notes (Signed)
   11/22/22 2039  Assess: MEWS Score  Temp 98 F (36.7 C)  BP (!) 56/33  MAP (mmHg) (!) 40  Pulse Rate (!) 103  Resp 16  SpO2 91 %  O2 Device Room Air  Assess: MEWS Score  MEWS Temp 0  MEWS Systolic 3  MEWS Pulse 1  MEWS RR 0  MEWS LOC 0  MEWS Score 4  MEWS Score Color Red  Assess: if the MEWS score is Yellow or Red  Were vital signs taken at a resting state? Yes  Focused Assessment Change from prior assessment (see assessment flowsheet)  Does the patient meet 2 or more of the SIRS criteria? No  Does the patient have a confirmed or suspected source of infection? Yes  MEWS guidelines implemented  Yes, red  Treat  MEWS Interventions Considered administering scheduled or prn medications/treatments as ordered  Take Vital Signs  Increase Vital Sign Frequency  Red: Q1hr x2, continue Q4hrs until patient remains green for 12hrs  Escalate  MEWS: Escalate Red: Discuss with charge nurse and notify provider. Consider notifying RRT. If remains red for 2 hours consider need for higher level of care  Notify: Charge Nurse/RN  Name of Charge Nurse/RN Notified Tiffany RN  Provider Notification  Provider Name/Title MD Valente David  Date Provider Notified 11/22/22  Time Provider Notified 2106  Method of Notification Page  Notification Reason Critical Result  Provider response See new orders  Date of Provider Response 11/22/22  Time of Provider Response 2117  Assess: SIRS CRITERIA  SIRS Temperature  0  SIRS Pulse 1  SIRS Respirations  0  SIRS WBC 0  SIRS Score Sum  1

## 2022-11-23 NOTE — Progress Notes (Signed)
*  PRELIMINARY RESULTS* Echocardiogram 2D Echocardiogram has been performed.  Keith Howe 11/23/2022, 4:06 PM

## 2022-11-23 NOTE — Progress Notes (Signed)
Progress Note   Patient: Keith Howe WCB:[id number] DOB: 1940-06-21 DOA: 11/24/2022     3 DOS: the patient was seen and examined on 11/23/2022   Brief hospital course: Taken from H&P.  DANNIEL TONES is a 82 y.o. 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  Per wife patient with worsening fatigue, generalized malaise and poor p.o. intake.  New left flank pain, previously he had right flank pain during most recent admission. Patient was noted to have decreased responsiveness and blood pressure of 63/45 noted at oncology center and was sent to ED.  ED course: On arrival to the ED, patient's blood pressure had improved to 109/63 but did decrease as low as 95/50.  He was noted to be tachycardic with heart rate of 116.  He was afebrile at 98.1.  Initial workup demonstrated hemoglobin of 9.1, creatinine of 1.67, BUN 36 with GFR 41, calcium of 12.1, lactic acid of 2.6.  Troponin elevated at 24 with flat trend to 22.  INR elevated at 1.3.  Urinalysis demonstrated moderate leukocytes, rare bacteria with hematuria.  UDS negative.  CT head, CTA head/neck and MRI of the brain were obtained with no evidence of acute infarct or LVO.  Admitted for concern of sepsis secondary to UTI and started on cefepime.  7/16; vitals stable, improving renal function, hemoglobin decreased to 7 but all cell lines decreased, likely some dilutional effect.  Recent bone marrow biopsy of at 20% malignant cells and pancytopenia-ordered 1 unit of PRBC after discussing with patient and family  Palliative care was also consulted due to grave prognosis with stage IV prostatic cancer with extensive bony mets. Preliminary blood cultures negative in 12 hours, urine cultures pending.  7/17: Urine and blood cultures negative.   EEG with generalized slowing and no epileptiform discharge, it was ordered in ED for concern of altered mental status.  Patient was on antibiotics for total of 14 days due to recent pyelonephritis, cefepime switched with ceftriaxone and we will complete that course, day 11 today.  Clinically stable, restarting home antihypertensives. Palliative care is on board and patient can be discharged home likely tomorrow once hospital bed is delivered.  Prognosis remained poor at this time. Patient was given a dose of pamidronate by oncology with some improvement in hypercalcemia.  7/18: Overnight patient became hypotensive and unresponsive, requiring transferring to ICU on pressors.  Patient was given his home dose of antihypertensives yesterday.  Repeat labs with some leukocytosis and decrease of hemoglobin to 5.2 from 8 point 3 in the morning, no obvious bleeding to explain this 3 digit decrease within a day.  2 unit of PRBC was ordered. Patient remained lethargic but responsive and answering simple questions appropriately.  He does not want to talk a whole lot which he was doing during current hospitalization.  Hemoglobin this morning 7.7 after getting 1 unit of PRBC, we will hold off to home antihypertensives at this time and his outpatient providers can restart if appropriate. Normotensive during morning rounds and Levophed has been stopped. Echocardiogram ordered-pending   Assessment and Plan: * Sepsis (HCC) Patient is presenting with altered mental status, hypotension, tachycardia and tachypnea and elevated lactic acid.  Urinalysis is concerning with either reinfection versus refractory infection given he was just admitted for sepsis secondary to pyelonephritis. Left CVA tenderness  noted today. Culture data on most recent admission demonstrated Serratia, which is known for creating biofilm. Sepsis ruled out as cultures remain negative. -Cefepime switched with ceftriaxone to complete a prior 14-day  course. - Blood and urine cultures remain negative - Foley catheter has been exchanged in the ED  Shock circulatory [facility]) Patient again became unresponsive and hypotensive overnight requiring transfer to ICU on pressors.  Similar episode which was the reason for the current hospitalization at cancer center.  Patient was restarted on home antihypertensives yesterday morning once his blood pressure started trending up and was more than 150 systolic. Able to wean off from pressors CTA was obtained and it was negative for PE but did show bilateral moderate pleural effusions.  So echocardiogram was ordered.  Also consistent with his history of extensive bony mets. -Continue holding home antihypertensives, if we need to restart we will only start at losartan 25 mg daily instead of his home dose of 100 mg. -Patient might need diuresis-awaiting blood pressure to remain stable. -Follow-up on echocardiogram  Hypercalcemia New significant hypercalcemia of 12.1, that corrects to 13.2 on admission, potentially secondary to bony metastasis.  Improved to 12.6 s/p IV fluid and pamidronate Parathyroid hormone appropriately low. -Avoid any calcium supplement -Continue to monitor  Prostate cancer metastatic to bone [facility]) History of prostate cancer, stage IVb with extensive bony metastasis.  Recent bone marrow aspiration demonstrates presence of ~ 20% prostate mets. -Apparently has stopped chemo -Palliative care and oncology consult-patient has limited choices.  Apparently oncology referred him to IR for a different treatment, he might not be a candidate but need evaluation by them, apparently that can only be done as outpatient.  Altered mental status Transient altered mental status when patient was hypotensive, with patient having no recollection of the event.  CVA has been ruled out with CT head, CTA of the head/neck and MRI.  EEG with generalized slowing, no epileptiform changes  Patient is currently back  to baseline.  No further intervention indicated.  7/18: Had another episode of becoming altered and unresponsive in the setting of hypotension.  Has been improved and repeat CT head was negative for any acute abnormality.  Normocytic anemia Multifactorial with advanced malignancy and involvement of bone marrow, being followed up at cancer center.  Recent bone marrow with pancytopenia and 20% malignant cells.  Hemoglobin at 8.3 after getting 1 unit of PRBC on 7/16.  Patient also received a dose of EPO by oncology on 7/16. Hemoglobin was found to be at 5.2 when rechecked during last night unresponsiveness and hypotension episode, no obvious bleeding.  Improved to 7.7 after getting 1 unit of PRBC -Monitor hemoglobin -Transfuse if below 7  Chronic kidney disease, stage 3b (HCC) Per chart review, patient's creatinine has ranged from 1.3-1.7 over the last 3 years.  Currently 1.67>>1.33, however it was 1.22 approximately 6 days ago. Some worsening of creatinine with hypotensive episode last night, at 1.60 - Continue to monitor renal function   Anemia in chronic kidney disease History of anemia secondary to CKD and bone marrow suppression in the setting of bone metastasis.  Hemoglobin is stable at this time.  - CBC in the a.m.  Uncontrolled type 2 diabetes mellitus with hyperglycemia, with long-term current use of insulin (HCC) - Hold home antiglycemic agents - SSI, sensitive - Semglee 12 units at bedtime - May need to consider discontinuing [place] +/- Trulicity in the long-term given patient has poor p.o. intake, recurrent hypotension, etc  Essential hypertension Patient had another  episode of profound hypotension, requiring pressors last night. Off the pressors now. -Keep holding home antihypertensives-if we need to restart we will restart losartan at a lower dose of 25 instead of 100   Subjective: Patient appears comfortable and at baseline when seen today.  Eyes were closed but easily  arousable and answering few questions as he was doing it before.  Wants to eat something.  Wife and sister at bedside  Physical Exam: Vitals:   11/23/22 1045 11/23/22 1100 11/23/22 1115 11/23/22 1145  BP: (!) 118/48 (!) 132/56 (!) 134/48 (!) 109/52  Pulse: (!) 121 (!) 121 (!) 121 (!) 119  Resp: (!) 22     Temp:      TempSrc:      SpO2: 97% 96% 97% 100%  Weight:      Height:       General.  Lethargic elderly man, in no acute distress. Pulmonary.  Lungs clear bilaterally, normal respiratory effort. CV.  Regular rate and rhythm, no JVD, rub or murmur. Abdomen.  Soft, nontender, nondistended, BS positive. CNS.  Alert and oriented .  No focal neurologic deficit. Extremities.  No edema, no cyanosis, pulses intact and symmetrical.  Data Reviewed: Prior data reviewed  Family Communication: Discussed with wife and sister at bedside  Disposition: Status is: Inpatient Remains inpatient appropriate because: Severity of illness  Planned Discharge Destination: Home  DVT prophylaxis.  Lovenox Time spent: 52 minutes  This record has been created using Conservation officer, historic buildings. Errors have been sought and corrected,but may not always be located. Such creation errors do not reflect on the standard of care.   Author: Arnetha Courser, MD 11/23/2022 1:09 PM  For on call review [email or web address].

## 2022-11-23 NOTE — Telephone Encounter (Signed)
[facility] testing requested on ARS- [id number], prostate biopsy, collected on 04/08/20

## 2022-11-23 NOTE — Telephone Encounter (Signed)
-----   Message from Rickard Patience sent at 11/22/2022  4:35 PM EDT ----- Please send [facility] on his specimen on 04/08/2020 prostate biopsy specimen [id number] thanks.  zy

## 2022-11-23 NOTE — Progress Notes (Signed)
   11/23/22 1300  Spiritual Encounters  Type of Visit Follow up  Care provided to: Pt and family  Referral source Chaplain assessment  Reason for visit Urgent spiritual support  OnCall Visit No  Spiritual Framework  Presenting Themes Caregiving needs;Courage hope and growth;Impactful experiences and emotions  Community/Connection Family;Friend(s);Significant other;Faith community  Patient Stress Factors Health changes  Family Stress Factors None identified  Interventions  Spiritual Care Interventions Made Reflective listening;Encouragement  Spiritual Care Plan  Spiritual Care Issues Still Outstanding Chaplain will continue to follow   Chaplain saw patients wife in the hallway of ICU and asked her about the patient. Patient was brought to ICU last night because his blood pressure dropped low. Chaplain spoke with the sister,wife, son, and daughter in law. Family say that they are trying to stay strong and that they are putting their faith in God. Family shared concerns about the patients health and asked that the chaplain continue to pray for them. Chaplain also reminded pt's sister and wife to make sure that they are getting rest. Chaplain will continue to follow up with pt and family.

## 2022-11-23 NOTE — Assessment & Plan Note (Signed)
Per chart review, patient's creatinine has ranged from 1.3-1.7 over the last 3 years.  Currently 1.67>>1.33, however it was 1.22 approximately 6 days ago. Some worsening of creatinine with hypotensive episode last night, at 1.60 - Continue to monitor renal function

## 2022-11-23 NOTE — Progress Notes (Signed)
   BRIEF PCCM NOTE  Pt was seen earlier by [facility] provider Dr. Karna Christmas.  Please see Dr. Terence Lux progress note for full assessment & plan.  BRIEF PT DESCRIPTION / SYNOPSIS :  82 y.o. male with PMHx most significant for Stage IVb metastatic prostrate cancer with extensive bony mets admitted with Acute Metabolic Encephalopathy and circulatory shock with is multifactorial in etiology.  SUBJECTIVE / INTERVAL HISTORY :  -Called to bedside by nursing as pt had very similar episode as last night and 2 days ago at the cancer center ~ became hypotensive with SBP 60's and unresponsive requiring re initiation of Levophed -Upon my arrival, BP is improving on Levophed,  pt lethargic but arouses to voice and follow simple commands, no focal deficits, pupils PERRL -His only complaint is dizziness -Will obtain EKG, Troponin, repeat H&H -No STEMI on EKG, HS Troponin still relatively flat at 44 (previously 22) -Hgb found to be 6.3 ~ will transfuse 1 additional unit of pRBC's and trend H&H q6h and place empirically on PPI BID  OBJECTIVE :   Today's Vitals   11/23/22 1745 11/23/22 1800 11/23/22 1815 11/23/22 1830  BP: (!) 121/59 (!) 141/53 (!) 123/54 (!) 140/53  Pulse: (!) 122 (!) 126 (!) 129 (!) 128  Resp: (!) 27 15 (!) 27 16  Temp:    97.7 F (36.5 C)  TempSrc:    Axillary  SpO2: 100% 100% 100% 100%  Weight:      Height:      PainSc:       Body mass index is 28.39 kg/m.   ASSESSMENT / PLAN :    #Circulatory shock suspect multifactorial in the setting of restarting antihypertensive medication, acute anemia & suspected vasovagal #Hypoalbuminemia in the setting of poor PO intake -Continuous cardiac monitoring -Maintain MAP >65 -Cautious IV fluids given pleural effusions -Transfusions as indicated ~ Hgb found to be 6.3 ~ will transfuse 1 unit of pRBC's -Vasopressors as needed to maintain MAP goal -Trend lactic acid until normalized -Trend HS Troponin until peaked  -Echocardiogram  performed, report pending -Cortisol drawn ~ will start Stress dose steroids       Pt's family updated at bedside.  Additional Critical Care Time: 15 minutes  Harlon Ditty, AGACNP-BC [facility] Prefer epic messenger for cross cover needs If after hours, please call E-link

## 2022-11-23 NOTE — Consult Note (Signed)
NAME:  Keith Howe, MRN:  [id number], DOB:  October 31, 1940, LOS: 3 ADMISSION DATE:  11-29-22, CONSULTATION DATE:  11/22/22 REFERRING MD:  Dr. Para March, CHIEF COMPLAINT:  Weakness   History of Present Illness:  82 yo M presenting to [facility] ED from the cancer center on 29-Nov-2022 for evaluation of weakness.  History provided per chart review and spouse report. Patient was in his normal state of health until he went to have lab work drawn at the cancer center on 11/29/2022. His wife was called back by staff who noted him to be minimally responsive, diaphoretic & hypotensive (63/45), CBG was stable at 200. Patient was recently discharged on 11/16/22 after an admission for pyelonephritis secondary to chronic foley catheter. Wife also reported fatigue and poor PO intake. No other complaints.   ED course: On arrival to the ED, patient's blood pressure had improved to 109/63 but did decrease as low as 95/50.  He was noted to be tachycardic with heart rate of 116.  He was afebrile at 98.1.  Initial workup demonstrated hemoglobin of 9.1, creatinine of 1.67, BUN 36 with GFR 41, calcium of 12.1, lactic acid of 2.6.  Troponin elevated at 24 with flat trend to 22.  INR elevated at 1.3.  Urinalysis demonstrated moderate leukocytes, rare bacteria with hematuria.  UDS negative.  CT head, CTA head/neck and MRI of the brain were obtained with no evidence of acute infarct or LVO.   Admitted for concern of sepsis secondary to UTI and started on cefepime.    ED course: Upon arrival patient's BP had improved to 109/63, he was mildly tachycardic and afebrile. Labs significant for hyperglycemia, hypercalcemia, hypoalbuminemia, mildly elevated but flat troponin, lactic acidosis without leukocytosis with baseline CKD 3b & anemia. UA +mod. Leuks, rare bacteria & hematuria. UDS negative. Patient worked up for stroke due to mentation changes. CTH, CTA head/neck and MRI brain obtained and negative for acute infarct or LVO. Patient admitted  by [facility] to PCU for concern of sepsis s/t UTI.  Hospital Course: Sepsis & acute CVA ruled out, EEG negative for seizure. Anti-hypertensive medication re-started 7/17 and overnight patient had a similar event to admission story where he became minimally responsive, hypotensive & diaphoretic- transferred to ICU and PCCM consulted. See significant events for more detail.  Recent Significant labs: (Labs/ Imaging personally reviewed) I, Cheryll Cockayne Rust-Chester, AGACNP-BC, personally viewed and interpreted this ECG. EKG Interpretation: Date: 11/22/2022, EKG Time: 22:10, Rate: 88, Rhythm: NSR, QRS Axis: Normal, Intervals: Normal, ST/T Wave abnormalities: Nonspecific T wave abnormalities, Narrative Interpretation: NSR Chemistry: Na+: 138, K+: 3.6, BUN/Cr.: 26/ 1.27, Serum CO2/ AG: 28/ 5, Ca: 11.2, albumin: 2.2 Hematology: WBC: 5.4, Hgb: 8.3, plt: 132  Troponin: 24 > 22, Lactic: 2.6 > 2.1, COVID-19 & Influenza A/B: negative  CXR 11-29-22: no active disease. Low lung volumes CT head code stroke without contrast 11/29/22: No acute finding by CT chronic small vessel ischemic changes of the cerebral hemispheric white matter.  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  Aspects is 10. CT angio head/neck code stroke 11/29/22: Negative for LVO.  Generally mild for age atherosclerosis in the head and neck.  Right ICA bulb bulky calcified plaque with 50% stenosis.  Up to moderate stenosis of the nondominant left vertebral artery origin and mild stenosis of the supraclinoid right ICA stenosis are due to calcified plaque.  Small bilateral layering pleural effusions. MRI brain without contrast November 29, 2022: No acute finding by MRI, age-related volume loss, minimal white matter  changes less than usually seen at this age.  CT head without contrast 11/22/2022: No acute intracranial abnormalities.  Mild cerebral atrophy.  No significant change since previous study. CT angio chest 11/22/2022: No  pulmonary embolism.  Extensive multivessel CAD.  Moderate bilateral pleural effusions with bilateral dependent atelectasis.  Stable small calcified pleural plaques within the basilar pleural spaces bilaterally nonspecific.  Stable diffuse mixed lytic and sclerotic metastatic disease throughout the visualized axial skeleton in keeping with the patient's known metastatic prostate cancer.  Aortic atherosclerosis.  PCCM consulted for assistance in management and monitoring due to circulatory shock requiring vasopressor support.  Pertinent  Medical History  Stage IVb metastatic prostate cancer Chronic Iron deficiency Anemia  T2DM CKD stage 3b MGUS BPH HTN HOH with hearing aides  Significant Hospital Events: Including procedures, antibiotic start and stop dates in addition to other pertinent events   7/16; vitals stable, improving renal function, hemoglobin decreased to 7 but all cell lines decreased, likely some dilutional effect.  Recent bone marrow biopsy of at 20% malignant cells and pancytopenia-ordered 1 unit of PRBC after discussing with patient and family Palliative care from cancer center was also consulted due to grave prognosis with stage IV prostatic cancer with extensive bony mets. Preliminary blood cultures negative in 12 hours, urine cultures pending.  7/17: Urine and blood cultures negative.  EEG with generalized slowing and no epileptiform discharge, it was ordered in ED for concern of altered mental status.  Patient was on antibiotics for total of 14 days due to recent pyelonephritis, cefepime switched with ceftriaxone and we will complete that course, day 11 today.  Clinically stable, restarting home antihypertensives. Palliative care is on board and patient can be discharged home likely tomorrow once hospital bed is delivered.  Prognosis remained poor at this time. Patient was given a dose of pamidronate by oncology with some improvement in hypercalcemia. Overnight patient noted  to be suddenly minimally responsive and significantly hypotensive with SBP dropping from 130's to 50's. He was urgently transferred to ICU and placed on vasopressor support. PCCM consulted.  11/23/22- patient remains altered.  He remains hypotensive. I met with family and we reviewed care plan and findings.   Interim History / Subjective:  Patient drowsy but responsive to voice, significantly hard of hearing without hearing aids present but able to interact and move all extremities.  BP on arrival to ICU still significantly hypotensive with SBP in the 50s.  IV fluid bolus initiated and Levophed drip initiated with emergent central line placement due to lack of stable access. Ongoing family updates, concerns answered throughout.  Objective   Blood pressure 110/62, pulse (!) 131, temperature 98 F (36.7 C), temperature source Axillary, resp. rate (!) 28, height 6\' 2"  (1.88 m), weight 100.3 kg, SpO2 98%.        Intake/Output Summary (Last 24 hours) at 11/23/2022 1619 Last data filed at 11/23/2022 1456 Gross per 24 hour  Intake 2468.07 ml  Output 1160 ml  Net 1308.07 ml   Filed Weights   11/25/2022 2050  Weight: 100.3 kg    Examination: General: Adult male, critically ill, lying in bed, NAD HEENT: MM pink/moist, anicteric, atraumatic, neck supple, significantly HOH without hearing aids present Neuro: RASS -1, A&O x 1-2, able to follow simple persistent commands, PERRL +3, MAE CV: s1s2 RRR, NSR on monitor, no r/m/g Pulm: Regular, non labored on RA, breath sounds coarse-BUL & diminished-BLL GI: soft, rounded, non tender, bs x 4 GU: foley in place with clear yellow urine  Skin: no rashes/lesions noted Extremities: warm/dry, pulses + 2 R/P, no edema noted  Resolved Hospital Problem list   Sepsis ruled out Acute CVA ruled out  Assessment & Plan:  Circulatory shock suspect multifactorial in the setting of restarting antihypertensive medication, acute anemia & suspected  vasovagal Hypoalbuminemia in the setting of poor PO intake PMHx: HTN Stat CTA ruled out PE, stat CBC shows acute anemia with hemoglobin drop to 5.2.  Believe this to be acute on chronic anemia in the setting of metastatic prostate cancer as lactic has normalized.  Mentation has improved greatly once blood pressure stabilized.  Stat CT head negative. Stat labs significant for acute on chronic anemia with hemoglobin of 5.2, hypomagnesemia at 1.6, mildly elevated PCT of 0.29, elevated BNP of 442 & hypoalbuminemia at 1.6.  Otherwise lactic WNL, renal function slightly worse but still close to baseline & mild hyperglycemia. -Initiate Levophed drip, wean as tolerated to maintain MAP > 65 -Patient received 1.5 L IV fluid bolus, conservative fluids from this point on due to elevated BNP -Consider conservative albumin supplementation  -Echocardiogram ordered to rule out new onset CHF or valvular disease -Losartan and metoprolol discontinued  Moderate bilateral pleural effusions with bilateral dependent atelectasis -appear larger since admission.  May need to consider thoracentesis evaluation, patient currently asymptomatic on room air. - Aggressive pulmonary toileting -Continuous pulse oximetry monitoring -Supplemental oxygen PRN, to maintain SpO2 > 90%  Acute encephalopathy Concern for possible partial seizure Patient was minimally responsive with a GCS of 3, also described as staring fixedly for a prolonged period of time.  However this was all while significantly hypotensive with SBP in the 50s.  Mentation has improved greatly with stable blood pressure.  Will hold off on AED for now. -Monitor for any other signs of seizure activity, if mentation changes with stable BP consider initiating AED and repeating EEG  Acute on chronic normocytic anemia in the setting of Chronic Disease -2 units PRBCs ordered urgently, will check H&H between units to avoid fluid overload -Oncology managing EPO dosing -  Monitor for s/s of bleeding - Daily CBC - Transfuse for Hgb <7  CKD Stage 3b Hypomagnesemia - Strict I/O's: alert provider if UOP < 0.5 mL/kg/hr - Daily BMP, replace electrolytes PRN - Avoid nephrotoxic agents as able, ensure adequate renal perfusion - Mg supplementation ordered  Type 2 Diabetes Mellitus Risk for Steroid Induced Hyperglycemia Hemoglobin A1C: 6.8 (07/31/22) - Monitor CBG Q 4 hours - SSI moderate dosing, continue Semglee 12 units daily - target range while in ICU: 140-180 - follow ICU hyper/hypo-glycemia protocol  Stage IVb metastatic prostate cancer Extensive bony metastasis.  Recent bone marrow aspiration demonstrates presence of ~20% prostate mets.  Per recent oncology note patient progressed on Xtandi, and was not able to tolerate docetaxel chemotherapy.  He is pending IR evaluation for possible Pluvicto treatments, currently borderline candidate and will need to follow-up outpatient.  Other options limited -Continue Decadron daily -Continue Megace 80 mg twice daily -Palliative care and oncology are following, appreciate input  Hypercalcemia -improved to normal range and overnight labs Status post pamidronate dose -Oncology following, appreciate input -Avoid any calcium supplementation  Best Practice (right click and "Reselect all SmartList Selections" daily)  Diet/type: Regular consistency (see orders) DVT prophylaxis: LMWH GI prophylaxis: N/A Lines: Central line and yes and it is still needed Foley:  Yes, and it is still needed Code Status:  full code Last date of multidisciplinary goals of care discussion [11/22/22]  Labs   CBC: Recent  Labs  Lab 11/07/2022 1006 11/21/22 0530 11/22/22 0109 11/22/22 0450 11/22/22 2310 11/23/22 0357  WBC 7.0 5.5  --  5.4 8.1 10.6*  NEUTROABS 5.7  --   --   --   --   --   HGB 9.1* 7.0* 7.9* 8.3* 5.2* 7.7*  HCT 29.8* 21.7* 24.9* 25.2* 16.1* 23.0*  MCV 87.9 86.1  --  83.2 86.1 83.9  PLT 171 138*  --  132* 137* 155     Basic Metabolic Panel: Recent Labs  Lab 11/09/2022 1006 11/21/22 0613 11/21/22 0621 11/22/22 0450 11/22/22 2310  NA 136  --  138 138 138  K 4.7  --  3.9 3.6 3.6  CL 100  --  102 105 105  CO2 25  --  26 28 26   GLUCOSE 222*  --  145* 147* 249*  BUN 36*  --  30* 26* 34*  CREATININE 1.67*  --  1.33* 1.27* 1.60*  CALCIUM 12.1*  --  11.3* 11.2* 9.9  MG  --   --   --   --  1.6*  PHOS  --  3.9  --   --  4.1   GFR: Estimated Creatinine Clearance: 45.8 mL/min (A) (by C-G formula based on SCr of 1.6 mg/dL (H)). Recent Labs  Lab 11/25/2022 1445 11/10/2022 2124 11/21/22 0530 11/22/22 0450 11/22/22 2310 11/23/22 0357  PROCALCITON  --   --   --   --  0.29  --   WBC  --   --  5.5 5.4 8.1 10.6*  LATICACIDVEN 2.6* 2.1*  --   --  1.7  --     Liver Function Tests: Recent Labs  Lab 12/02/2022 1006 11/22/22 0450 11/22/22 2310  AST 25 25 20   ALT 23 19 15   ALKPHOS 83 61 54  BILITOT 0.7 1.0 0.4  PROT 7.0 5.8* 4.1*  ALBUMIN 2.6* 2.2* 1.6*   No results for input(s): "LIPASE", "AMYLASE" in the last 168 hours. No results for input(s): "AMMONIA" in the last 168 hours.  ABG    Component Value Date/Time   HCO3 26.6 11/22/2022 2312   ACIDBASEDEF 6.6 (H) 09/21/2022 2308   O2SAT 54 11/22/2022 2312     Coagulation Profile: Recent Labs  Lab 11/25/2022 1006 11/21/22 0621 11/22/22 2310  INR 1.3* 1.3* 1.6*    Cardiac Enzymes: Recent Labs  Lab 12/01/2022 1006  CKMB 3.0    HbA1C: HbA1c POC (<> result, manual entry)  Date/Time Value Ref Range Status  02/23/2021 09:11 AM 5.6 4.0 - 5.6 % Final   Hgb A1c MFr Bld  Date/Time Value Ref Range Status  07/31/2022 04:23 PM 6.8 (H) 4.8 - 5.6 % Final    Comment:    (NOTE)         Prediabetes: 5.7 - 6.4         Diabetes: >6.4         Glycemic control for adults with diabetes: <7.0   03/06/2018 01:47 PM 7.3 (H) 4.8 - 5.6 % Final    Comment:    (NOTE) Pre diabetes:          5.7%-6.4% Diabetes:              >6.4% Glycemic control for    <7.0% adults with diabetes     CBG: Recent Labs  Lab 11/22/22 2204 11/23/22 0026 11/23/22 0335 11/23/22 0804 11/23/22 1144  GLUCAP 219* 226* 281* 297* 230*    Review of Systems:   UTA- patient responsive but drowsy  and extremely HOH without hearing aides present, unable to participate in interview at this time. Only current complaint is feeling cold.  Past Medical History:  He,  has a past medical history of BPH (benign prostatic hyperplasia), Cancer (HCC), Chronic kidney disease, Diabetes mellitus without complication (HCC), HOH (hard of hearing), Hypertension, Inguinal hernia, Pain, and Prostate cancer (HCC).   Surgical History:   Past Surgical History:  Procedure Laterality Date   BACK SURGERY     Lumbar   CATARACT EXTRACTION W/PHACO Right 07/10/2017   Procedure: CATARACT EXTRACTION PHACO AND INTRAOCULAR LENS PLACEMENT (IOC);  Surgeon: Galen Manila, MD;  Location: [facility] ORS;  Service: Ophthalmology;  Laterality: Right;  [place] 00:29.2 AP% 16.5 CDE 4.83 Fluid Pack Lot # [id number]   COLONOSCOPY     EYE SURGERY Bilateral    cataract extraction   HERNIA REPAIR     INGUINAL   IR BONE MARROW BIOPSY & ASPIRATION  11/15/2022   PROSTATE BIOPSY N/A 04/08/2020   Procedure: PROSTATE BIOPSY Addison Bailey;  Surgeon: Orson Ape, MD;  Location: [facility] ORS;  Service: Urology;  Laterality: N/A;   TOTAL KNEE ARTHROPLASTY Left 03/19/2018   Procedure: TOTAL KNEE ARTHROPLASTY;  Surgeon: Juanell Fairly, MD;  Location: [facility] ORS;  Service: Orthopedics;  Laterality: Left;     Social History:   reports that he has never smoked. He has never been exposed to tobacco smoke. He has never used smokeless tobacco. He reports that he does not drink alcohol and does not use drugs.   Family History:  His family history includes Breast cancer in his sister; Diabetes in his father.   Allergies No Known Allergies   Home Medications  Prior to Admission medications   Medication Sig Start Date End Date  Taking? Authorizing Provider  aspirin EC 81 MG tablet Take 81 mg by mouth daily.   Yes [provider]  cholecalciferol (VITAMIN D) 1000 units tablet Take 1,000 Units by mouth daily.   Yes [provider]  ciprofloxacin (CIPRO) 500 MG tablet Take 1 tablet (500 mg total) by mouth 2 (two) times daily for 20 doses. 11/16/22 11/13/2022 Yes Enedina Finner, MD  dapagliflozin propanediol (FARXIGA) 10 MG TABS tablet Take 10 mg by mouth daily.   Yes [provider]  Iron-Vitamin C 65-125 MG TABS Take 1 tablet by mouth daily. 08/23/22  Yes Rickard Patience, MD  loratadine (CLARITIN) 10 MG tablet Take 10 mg by mouth daily as needed.   Yes [provider]  losartan (COZAAR) 100 MG tablet Take 100 mg by mouth daily. Hold for BP below 111.   Yes [provider]  lovastatin (MEVACOR) 40 MG tablet TAKE 1 TABLET BY MOUTH EVERY DAY 04/25/22  Yes Masoud, Renda Rolls, MD  metoprolol tartrate (LOPRESSOR) 50 MG tablet TAKE 1 TABLET BY MOUTH EVERY 12 HOURS Patient taking differently: Take 50 mg by mouth 2 (two) times daily. Hold for BP below 111. 11/25/21  Yes Masoud, Renda Rolls, MD  Misc Natural Products (PROSTATE SUPPORT PO) Take 2 capsules by mouth daily.   Yes [provider]  NOVOLOG FLEXPEN 100 UNIT/ML FlexPen Inject into the skin. Per sliding scale 01/03/18  Yes [provider]  Omega-3 Fatty Acids (FISH OIL PO) Take 1 capsule by mouth daily.    Yes [provider]  oxyCODONE (ROXICODONE) 5 MG immediate release tablet Take 1 tablet (5 mg total) by mouth every 8 (eight) hours as needed. 11/17/22 11/17/23 Yes Enedina Finner, MD  senna (SENOKOT) 8.6 MG TABS tablet Take 1  tablet (8.6 mg total) by mouth daily. 11/17/22  Yes Enedina Finner, MD  TRESIBA FLEXTOUCH 200 UNIT/ML FlexTouch Pen Inject 26 Units into the skin daily before breakfast. 11/16/22  Yes Enedina Finner, MD  TRULICITY 1.5 MG/0.5ML SOPN Inject 1.5 mg into the skin once a week. 08/10/22  Yes [provider]  vitamin  B-12 (CYANOCOBALAMIN) 500 MCG tablet Take 500 mcg by mouth daily.   Yes [provider]  amLODipine (NORVASC) 5 MG tablet Take 5 mg by mouth daily. Patient not taking: Reported on 22-Nov-2022 11/16/22   [provider]     Critical care provider statement:   Total critical care time: 33 minutes   Performed by: Karna Christmas MD   Critical care time was exclusive of separately billable procedures and treating other patients.   Critical care was necessary to treat or prevent imminent or life-threatening deterioration.   Critical care was time spent personally by me on the following activities: development of treatment plan with patient and/or surrogate as well as nursing, discussions with consultants, evaluation of patient's response to treatment, examination of patient, obtaining history from patient or surrogate, ordering and performing treatments and interventions, ordering and review of laboratory studies, ordering and review of radiographic studies, pulse oximetry and re-evaluation of patient's condition.    Vida Rigger, M.D.  Pulmonary & Critical Care Medicine

## 2022-11-23 NOTE — Plan of Care (Signed)
  Problem: Fluid Volume: Goal: Hemodynamic stability will improve Outcome: Progressing   Problem: Clinical Measurements: Goal: Diagnostic test results will improve Outcome: Progressing   Problem: Skin Integrity: Goal: Risk for impaired skin integrity will decrease Outcome: Progressing   Problem: Coping: Goal: Ability to adjust to condition or change in health will improve Outcome: Not Progressing   Problem: Metabolic: Goal: Ability to maintain appropriate glucose levels will improve Outcome: Not Progressing   Problem: Education: Goal: Knowledge of General Education information will improve Description: Including pain rating scale, medication(s)/side effects and non-pharmacologic comfort measures Outcome: Not Progressing

## 2022-11-23 NOTE — Assessment & Plan Note (Signed)
Patient again became unresponsive and hypotensive overnight requiring transfer to ICU on pressors.  Similar episode which was the reason for the current hospitalization at cancer center.  Patient was restarted on home antihypertensives yesterday morning once his blood pressure started trending up and was more than 150 systolic. Able to wean off from pressors CTA was obtained and it was negative for PE but did show bilateral moderate pleural effusions.  So echocardiogram was ordered.  Also consistent with his history of extensive bony mets. -Continue holding home antihypertensives, if we need to restart we will only start at losartan 25 mg daily instead of his home dose of 100 mg. -Patient might need diuresis-awaiting blood pressure to remain stable. -Follow-up on echocardiogram

## 2022-11-23 NOTE — Progress Notes (Addendum)
Daily Progress Note   Patient Name: Keith Howe       Date: 11/23/2022 DOB: 05-Apr-1941  Age: 82 y.o. MRN#: [id number] Attending Physician: Vida Rigger, MD Primary Care Physician: Corky Downs, MD Admit Date: Dec 20, 2022  Reason for Consultation/Follow-up: Establishing goals of care  Subjective: Notes and labs reviewed.  Patient has been moved to ICU, is resting in bed with no distress noted.  Patient with visitors noted at bedside with him he is interacting.   Spoke with previous MedSurg attending, and discussed blood pressure and current admission to ICU in addition to oncology updates, and patient and family hopes for follow-up with radiation oncology upon discharge..  Continue current care.    Length of Stay: 3  Current Medications: Scheduled Meds:   aspirin  81 mg Oral Daily   Chlorhexidine Gluconate Cloth  6 each Topical Daily   dexamethasone  0.5 mg Oral Daily   enoxaparin (LOVENOX) injection  40 mg Subcutaneous Q24H   insulin aspart  0-15 Units Subcutaneous Q4H   insulin glargine-yfgn  12 Units Subcutaneous QHS   megestrol  80 mg Oral BID   pravastatin  40 mg Oral q1800   senna  1 tablet Oral Daily   sodium chloride flush  3 mL Intravenous Q12H    Continuous Infusions:  sodium chloride Stopped (11/22/22 0201)   sodium chloride Stopped (11/23/22 0019)   [START ON 11/24/2022] cefTRIAXone (ROCEPHIN)  IV     norepinephrine (LEVOPHED) Adult infusion Stopped (11/23/22 0946)   sodium chloride Stopped (11/23/22 0025)    PRN Meds: sodium chloride, acetaminophen **OR** acetaminophen, HYDROcodone-acetaminophen, HYDROmorphone (DILAUDID) injection, ondansetron **OR** ondansetron (ZOFRAN) IV  Physical Exam Pulmonary:     Effort: Pulmonary effort is normal.  Neurological:      Mental Status: He is alert.             Vital Signs: BP (!) 109/52   Pulse (!) 119   Temp 97.6 F (36.4 C) (Axillary)   Resp (!) 22   Ht 6\' 2"  (1.88 m)   Wt 100.3 kg   SpO2 100%   BMI 28.39 kg/m  SpO2: SpO2: 100 % O2 Device: O2 Device: Room Air O2 Flow Rate:    Intake/output summary:  Intake/Output Summary (Last 24 hours) at 11/23/2022 1231 Last data filed at 11/23/2022 1047 Gross per  24 hour  Intake 2468.07 ml  Output 1745 ml  Net 723.07 ml   LBM: Last BM Date : 11/23/22 Baseline Weight: Weight: 100.3 kg Most recent weight: Weight: 100.3 kg Patient Active Problem List   Diagnosis Date Noted   Shock circulatory (HCC) 11/22/2022   AKI (acute kidney injury) (HCC) 11/21/2022   Sepsis (HCC) 11/19/2022   Hypercalcemia 11/13/2022   Altered mental status 11/25/2022   Benign prostatic hyperplasia 11/13/2022   Spinal stenosis 11/13/2022   Anemia due to bone marrow failure (HCC) 11/11/2022   Acute exacerbation of chronic low back pain 11/11/2022   Antineoplastic chemotherapy induced anemia 11/11/2022   Compression fracture of L2 lumbar vertebra, closed, initial encounter (HCC) 11/11/2022   Indwelling Foley catheter present 11/11/2022   Hypotension 09/27/2022   Anemia in chronic kidney disease 09/21/2022   Chronic kidney disease, stage 3b (HCC) 09/21/2022   Leukocytosis 09/21/2022   Bilateral hydronephrosis 09/20/2022   Chemotherapy-induced neuropathy (HCC) 09/13/2022   Weight loss 08/09/2022   Syncope 07/31/2022   SIRS (systemic inflammatory response syndrome) (HCC) 07/31/2022   Obesity (BMI 30-39.9) 07/31/2022   Uncontrolled type 2 diabetes mellitus with hyperglycemia, with long-term current use of insulin (HCC) 07/31/2022   Myocardial injury 07/31/2022   Bilateral leg edema 07/31/2022   Normocytic anemia 07/31/2022   Chemotherapy induced neutropenia (HCC) 07/05/2022   Encounter for antineoplastic chemotherapy 05/26/2022   Prostate cancer metastatic to bone (HCC)  07/13/2021   Goals of care, counseling/discussion 07/13/2021   Injury of quadriceps muscle 08/26/2020   Chronic kidney disease, stage 3a (HCC) 05/27/2020   Annual physical exam 02/18/2020   Need for influenza vaccination 02/18/2020   Essential hypertension 12/15/2019   Class 1 obesity with serious comorbidity and body mass index (BMI) of 31.0 to 31.9 in adult 10/13/2019   Type 2 diabetes mellitus without complication, with long-term current use of insulin (HCC) 10/13/2019   S/P TKR (total knee replacement) using cement, left 03/19/2018    Palliative Care Assessment & Plan    Recommendations/Plan: Continuing current care Patient would be a candidate for hospice care if he were to choose this path. If patient continues to desire radiation oncology intervention, would recommend outpatient palliative to follow.  Code Status:    Code Status Orders  (From admission, onward)           Start     Ordered   11/09/2022 1824  Full code  Continuous       Question:  By:  Answer:  Consent: discussion documented in EHR   11/06/2022 1825           Code Status History     Date Active Date Inactive Code Status Order ID Comments User Context   11/11/2022 0034 11/16/2022 2107 Full Code [id number]  Andris Baumann, MD ED   09/21/2022 1113 09/26/2022 1907 Full Code [id number]  Lorretta Harp, MD ED   07/31/2022 1524 08/02/2022 1832 Full Code [id number]  Lorretta Harp, MD ED   03/19/2018 1246 03/24/2018 1814 Full Code [id number]  Juanell Fairly, MD Inpatient       Prognosis: Poor overall    Care plan was discussed with Dr. Nelson Chimes  Thank you for allowing the Palliative Medicine Team to assist in the care of this patient.   Morton Stall, NP  Please contact Palliative Medicine Team phone at [phone] for questions and concerns.

## 2022-11-23 NOTE — Progress Notes (Signed)
Patient currently admitted, continue to monitor. Will schedule once discharged.

## 2022-11-23 NOTE — TOC Progression Note (Signed)
Transition of Care [facility]) - Progression Note    Patient Details  Name: Keith Howe MRN: [id number] Date of Birth: February 12, 1941  Transition of Care [facility]) CM/SW Contact  Darolyn Rua, [place] Phone Number: 11/23/2022, 9:36 AM  Clinical Narrative:     7/18: Per Mitch with [employer] patient's hospital bed was delivered to home yesterday evening 7/17.   7/16:  CSW met with patient, patient spouse and sister at bedside. They report that patient is currently active with [facility], continue to see Dr. Juel Burrow as PCP. Reports patient has a rollator at home, preferred pharmacy is [facility]. Wife transports to and from appointments.    They are interested in a hospital bed at home, ordered via [employer].    Confirmed spouse [name]: [place] [phone] Home - [phone]   Bonita Quin sister [place] [phone]  Expected Discharge Plan: Home w Home Health Services Barriers to Discharge: Continued Medical Work up  Expected Discharge Plan and Services       Living arrangements for the past 2 months: Single Family Home                                       Social Determinants of Health (SDOH) Interventions SDOH Screenings   Food Insecurity: No Food Insecurity (12/04/2022)  Housing: Low Risk  (11/27/2022)  Transportation Needs: No Transportation Needs (11/15/2022)  Utilities: Not At Risk (11/09/2022)  Alcohol Screen: Low Risk  (05/12/2021)  Depression (PHQ2-9): Low Risk  (08/29/2021)  Financial Resource Strain: Low Risk  (05/12/2021)  Physical Activity: Insufficiently Active (05/12/2021)  Social Connections: Socially Integrated (05/12/2021)  Stress: No Stress Concern Present (05/12/2021)  Tobacco Use: Low Risk  (11/08/2022)    Readmission Risk Interventions    09/25/2022   12:18 PM  Readmission Risk Prevention Plan  Transportation Screening Complete  PCP or Specialist Appt within 3-5 Days Complete  HRI or Home Care Consult Complete  Social Work Consult for Recovery Care  Planning/Counseling Complete  Palliative Care Screening Complete  Medication Review Oceanographer) Complete

## 2022-11-24 ENCOUNTER — Ambulatory Visit: Payer: Medicare HMO

## 2022-11-24 ENCOUNTER — Inpatient Hospital Stay: Payer: Medicare HMO

## 2022-11-24 DIAGNOSIS — C61 Malignant neoplasm of prostate: Secondary | ICD-10-CM | POA: Diagnosis not present

## 2022-11-24 DIAGNOSIS — A419 Sepsis, unspecified organism: Secondary | ICD-10-CM | POA: Diagnosis not present

## 2022-11-24 DIAGNOSIS — G9340 Encephalopathy, unspecified: Secondary | ICD-10-CM | POA: Diagnosis not present

## 2022-11-24 DIAGNOSIS — I421 Obstructive hypertrophic cardiomyopathy: Secondary | ICD-10-CM

## 2022-11-24 DIAGNOSIS — D649 Anemia, unspecified: Secondary | ICD-10-CM | POA: Diagnosis not present

## 2022-11-24 DIAGNOSIS — R404 Transient alteration of awareness: Secondary | ICD-10-CM | POA: Diagnosis not present

## 2022-11-24 DIAGNOSIS — Z7189 Other specified counseling: Secondary | ICD-10-CM | POA: Diagnosis not present

## 2022-11-24 LAB — TYPE AND SCREEN
ABO/RH(D): B POS
Antibody Screen: NEGATIVE
Unit division: 0
Unit division: 0
Unit division: 0

## 2022-11-24 LAB — HEMOGLOBIN AND HEMATOCRIT, BLOOD
HCT: 19.9 % — ABNORMAL LOW (ref 39.0–52.0)
HCT: 21.2 % — ABNORMAL LOW (ref 39.0–52.0)
Hemoglobin: 6.7 g/dL — ABNORMAL LOW (ref 13.0–17.0)
Hemoglobin: 7.2 g/dL — ABNORMAL LOW (ref 13.0–17.0)

## 2022-11-24 LAB — BPAM RBC
Blood Product Expiration Date: 202407282359
Blood Product Expiration Date: 202408142359
Blood Product Expiration Date: 202408182359
ISSUE DATE / TIME: 202407161531
ISSUE DATE / TIME: 202407180048
ISSUE DATE / TIME: 202407181830
Unit Type and Rh: 1700
Unit Type and Rh: 7300
Unit Type and Rh: 7300

## 2022-11-24 LAB — CBC
HCT: 21.5 % — ABNORMAL LOW (ref 39.0–52.0)
Hemoglobin: 7.1 g/dL — ABNORMAL LOW (ref 13.0–17.0)
MCH: 28.2 pg (ref 26.0–34.0)
MCHC: 33 g/dL (ref 30.0–36.0)
MCV: 85.3 fL (ref 80.0–100.0)
Platelets: 102 10*3/uL — ABNORMAL LOW (ref 150–400)
RBC: 2.52 MIL/uL — ABNORMAL LOW (ref 4.22–5.81)
RDW: 15.9 % — ABNORMAL HIGH (ref 11.5–15.5)
WBC: 5.9 10*3/uL (ref 4.0–10.5)
nRBC: 1.4 % — ABNORMAL HIGH (ref 0.0–0.2)

## 2022-11-24 LAB — RENAL FUNCTION PANEL
Albumin: 2.1 g/dL — ABNORMAL LOW (ref 3.5–5.0)
Anion gap: 7 (ref 5–15)
BUN: 65 mg/dL — ABNORMAL HIGH (ref 8–23)
CO2: 26 mmol/L (ref 22–32)
Calcium: 10.1 mg/dL (ref 8.9–10.3)
Chloride: 110 mmol/L (ref 98–111)
Creatinine, Ser: 1.7 mg/dL — ABNORMAL HIGH (ref 0.61–1.24)
GFR, Estimated: 40 mL/min — ABNORMAL LOW (ref >60)
Glucose, Bld: 251 mg/dL — ABNORMAL HIGH (ref 70–99)
Phosphorus: 2.8 mg/dL (ref 2.5–4.6)
Potassium: 3.6 mmol/L (ref 3.5–5.1)
Sodium: 143 mmol/L (ref 135–145)

## 2022-11-24 LAB — HEPATIC FUNCTION PANEL
ALT: 16 U/L (ref 0–44)
AST: 20 U/L (ref 15–41)
Albumin: 2 g/dL — ABNORMAL LOW (ref 3.5–5.0)
Alkaline Phosphatase: 60 U/L (ref 38–126)
Bilirubin, Direct: <0.1 mg/dL (ref 0.0–0.2)
Total Bilirubin: 0.3 mg/dL (ref 0.3–1.2)
Total Protein: 5 g/dL — ABNORMAL LOW (ref 6.5–8.1)

## 2022-11-24 LAB — CULTURE, BLOOD (ROUTINE X 2)
Culture: NO GROWTH
Special Requests: ADEQUATE

## 2022-11-24 LAB — C-REACTIVE PROTEIN: CRP: 5.7 mg/dL — ABNORMAL HIGH (ref <1.0)

## 2022-11-24 LAB — TROPONIN I (HIGH SENSITIVITY): Troponin I (High Sensitivity): 36 ng/L — ABNORMAL HIGH (ref <18)

## 2022-11-24 LAB — GLUCOSE, CAPILLARY
Glucose-Capillary: 242 mg/dL — ABNORMAL HIGH (ref 70–99)
Glucose-Capillary: 198 mg/dL — ABNORMAL HIGH (ref 70–99)
Glucose-Capillary: 171 mg/dL — ABNORMAL HIGH (ref 70–99)

## 2022-11-24 LAB — MAGNESIUM: Magnesium: 2 mg/dL (ref 1.7–2.4)

## 2022-11-24 MED ORDER — FENTANYL CITRATE PF 50 MCG/ML IJ SOSY
25.0000 ug | PREFILLED_SYRINGE | INTRAMUSCULAR | Status: DC | PRN
  Administered 2022-11-24: 25 ug via INTRAVENOUS

## 2022-11-24 MED ORDER — LORAZEPAM 2 MG/ML IJ SOLN
0.5000 mg | INTRAMUSCULAR | Status: DC | PRN
  Administered 2022-11-26: 0.5 mg via INTRAVENOUS
  Filled 2022-11-24: qty 1

## 2022-11-24 MED ORDER — HYDROMORPHONE HCL 1 MG/ML IJ SOLN
1.0000 mg | INTRAMUSCULAR | Status: DC | PRN
  Administered 2022-11-24: 1 mg via INTRAVENOUS

## 2022-11-24 MED ORDER — FENTANYL CITRATE PF 50 MCG/ML IJ SOSY
PREFILLED_SYRINGE | INTRAMUSCULAR | Status: AC
  Filled 2022-11-24: qty 1

## 2022-11-24 MED ORDER — HYDROMORPHONE HCL 1 MG/ML IJ SOLN
1.0000 mg | INTRAMUSCULAR | Status: DC | PRN
  Administered 2022-11-24 – 2022-11-26 (×8): 1 mg via INTRAVENOUS
  Filled 2022-11-24 (×9): qty 1

## 2022-11-24 NOTE — Progress Notes (Addendum)
[facility]- Civil engineer, contracting [facility])  Received a referral to evaluate the patient for the Hospice Home.  Patient approved for Routine level of care.  Family wanting some time to think about it.  [facility] will follow up tomorrow.  Please don't hesitate to call with any Hospice related questions or concerns.    Thank you for the opportunity to participate in this patient's care. [facility] Liaison [phone]

## 2022-11-24 NOTE — Progress Notes (Signed)
Hematology/Oncology Progress note [phone] [phone]     Patient Care Team: Corky Downs, MD as PCP - General (Internal Medicine) Carmina Miller, MD as Consulting Physician (Radiation Oncology) Rickard Patience, MD as Consulting Physician (Oncology) Mady Haagensen, MD as Consulting Physician (Nephrology) Orson Ape, MD as Consulting Physician (Urology)   Name of the patient: Keith Howe  [id number]  1941-03-10  Date of visit: 11/24/22   INTERVAL HISTORY-   No acute overnight events.  His son and sister are at the bedside. Patient opens her eyes, but not engaging in any conversation with me.  No Known Allergies  Patient Active Problem List   Diagnosis Date Noted   Prostate cancer metastatic to bone (HCC) 07/13/2021    Priority: High   Hypotension 09/27/2022    Priority: Medium    Chemotherapy-induced neuropathy (HCC) 09/13/2022    Priority: Medium    Normocytic anemia 07/31/2022    Priority: Medium    Chronic kidney disease, stage 3a (HCC) 05/27/2020    Priority: Medium    Bilateral hydronephrosis 09/20/2022    Priority: Low   Weight loss 08/09/2022    Priority: Low   Chemotherapy induced neutropenia (HCC) 07/05/2022    Priority: Low   Encounter for antineoplastic chemotherapy 05/26/2022    Priority: Low   Goals of care, counseling/discussion 07/13/2021    Priority: Low   Type 2 diabetes mellitus without complication, with long-term current use of insulin (HCC) 10/13/2019    Priority: Low   Shock circulatory (HCC) 11/22/2022   AKI (acute kidney injury) (HCC) 11/21/2022   Sepsis (HCC) 2022/12/11   Hypercalcemia 12-11-2022   Altered mental status 12/11/2022   Benign prostatic hyperplasia 11/13/2022   Spinal stenosis 11/13/2022   Anemia due to bone marrow failure (HCC) 11/11/2022   Acute exacerbation of chronic low back pain 11/11/2022   Antineoplastic chemotherapy induced anemia 11/11/2022   Compression fracture of L2 lumbar  vertebra, closed, initial encounter (HCC) 11/11/2022   Indwelling Foley catheter present 11/11/2022   Anemia in chronic kidney disease 09/21/2022   Chronic kidney disease, stage 3b (HCC) 09/21/2022   Leukocytosis 09/21/2022   Syncope 07/31/2022   SIRS (systemic inflammatory response syndrome) (HCC) 07/31/2022   Obesity (BMI 30-39.9) 07/31/2022   Uncontrolled type 2 diabetes mellitus with hyperglycemia, with long-term current use of insulin (HCC) 07/31/2022   Myocardial injury 07/31/2022   Bilateral leg edema 07/31/2022   Injury of quadriceps muscle 08/26/2020   Annual physical exam 02/18/2020   Need for influenza vaccination 02/18/2020   Essential hypertension 12/15/2019   Class 1 obesity with serious comorbidity and body mass index (BMI) of 31.0 to 31.9 in adult 10/13/2019   S/P TKR (total knee replacement) using cement, left 03/19/2018     Past Medical History:  Diagnosis Date   BPH (benign prostatic hyperplasia)    Cancer (HCC)    Chronic kidney disease    STAGE 3   Diabetes mellitus without complication (HCC)    HOH (hard of hearing)    hearing aides   Hypertension    Inguinal hernia    Pain    CHRONIC LBP   Prostate cancer [facility])      Past Surgical History:  Procedure Laterality Date   BACK SURGERY     Lumbar   CATARACT EXTRACTION W/PHACO Right 07/10/2017   Procedure: CATARACT EXTRACTION PHACO AND INTRAOCULAR LENS PLACEMENT (IOC);  Surgeon: Galen Manila, MD;  Location: [facility] ORS;  Service: Ophthalmology;  Laterality: Right;  [place] 00:29.2 AP% 16.5 CDE  4.83 Fluid Pack Lot # [id number]   COLONOSCOPY     EYE SURGERY Bilateral    cataract extraction   HERNIA REPAIR     INGUINAL   IR BONE MARROW BIOPSY & ASPIRATION  11/15/2022   PROSTATE BIOPSY N/A 04/08/2020   Procedure: PROSTATE BIOPSY Addison Bailey;  Surgeon: Orson Ape, MD;  Location: [facility] ORS;  Service: Urology;  Laterality: N/A;   TOTAL KNEE ARTHROPLASTY Left 03/19/2018   Procedure: TOTAL KNEE ARTHROPLASTY;   Surgeon: Juanell Fairly, MD;  Location: [facility] ORS;  Service: Orthopedics;  Laterality: Left;    Social History   Socioeconomic History   Marital status: Married    Spouse name: Not on file   Number of children: Not on file   Years of education: Not on file   Highest education level: Some college, no degree  Occupational History   Not on file  Tobacco Use   Smoking status: Never    Passive exposure: Never   Smokeless tobacco: Never  Vaping Use   Vaping status: Never Used  Substance and Sexual Activity   Alcohol use: No   Drug use: Never   Sexual activity: Not Currently  Other Topics Concern   Not on file  Social History Narrative   Independent at baseline.  Lives at home with his wife   Social Determinants of Health   Financial Resource Strain: Low Risk  (05/12/2021)   Overall Financial Resource Strain (CARDIA)    Difficulty of Paying Living Expenses: Not hard at all  Food Insecurity: No Food Insecurity (11/15/2022)   Hunger Vital Sign    Worried About Running Out of Food in the Last Year: Never true    Ran Out of Food in the Last Year: Never true  Transportation Needs: No Transportation Needs (11/15/2022)   PRAPARE - Administrator, Civil Service (Medical): No    Lack of Transportation (Non-Medical): No  Physical Activity: Insufficiently Active (05/12/2021)   Exercise Vital Sign    Days of Exercise per Week: 7 days    Minutes of Exercise per Session: 10 min  Stress: No Stress Concern Present (05/12/2021)   [employer] of Occupational Health - Occupational Stress Questionnaire    Feeling of Stress : Not at all  Social Connections: Socially Integrated (05/12/2021)   Social Connection and Isolation Panel [NHANES]    Frequency of Communication with Friends and Family: More than three times a week    Frequency of Social Gatherings with Friends and Family: Once a week    Attends Religious Services: More than 4 times per year    Active Member of [employer] or  Organizations: Yes    Attends Engineer, structural: More than 4 times per year    Marital Status: Married  Catering manager Violence: Not At Risk (11/21/2022)   Humiliation, Afraid, Rape, and Kick questionnaire    Fear of Current or Ex-Partner: No    Emotionally Abused: No    Physically Abused: No    Sexually Abused: No     Family History  Problem Relation Age of Onset   Diabetes Father    Breast cancer Sister      Current Facility-Administered Medications:    0.9 %  sodium chloride infusion (Manually program via Guardrails IV Fluids), , Intravenous, Once, Judithe Modest, NP, Held at 11/23/22 1853   0.9 %  sodium chloride infusion, , Intravenous, PRN, Arnetha Courser, MD, Stopped at 11/22/22 0201   0.9 %  sodium chloride infusion, 250  mL, Intravenous, Continuous, Rust-Chester, Cecelia Byars, NP, Held at 11/23/22 0019   acetaminophen (TYLENOL) tablet 650 mg, 650 mg, Oral, Q6H PRN **OR** acetaminophen (TYLENOL) suppository 650 mg, 650 mg, Rectal, Q6H PRN, Verdene Lennert, MD   aspirin chewable tablet 81 mg, 81 mg, Oral, Daily, 81 mg at 11/24/22 0857 **OR** [DISCONTINUED] aspirin suppository 300 mg, 300 mg, Rectal, Daily, Jefferson Fuel, MD   cefTRIAXone (ROCEPHIN) 2 g in sodium chloride 0.9 % 100 mL IVPB, 2 g, Intravenous, Daily, Nazari, Walid A, RPH, Last Rate: 200 mL/hr at 11/24/22 1000, Infusion Verify at 11/24/22 1000   Chlorhexidine Gluconate Cloth 2 % PADS 6 each, 6 each, Topical, Daily, Arnetha Courser, MD, 6 each at 11/24/22 0858   enoxaparin (LOVENOX) injection 40 mg, 40 mg, Subcutaneous, Q24H, Verdene Lennert, MD, 40 mg at 11/23/22 2121   fentaNYL (SUBLIMAZE) injection 25 mcg, 25 mcg, Intravenous, Q2H PRN, Rust-Chester, Britton L, NP, 25 mcg at 11/24/22 0457   HYDROcodone-acetaminophen (NORCO/VICODIN) 5-325 MG per tablet 1-2 tablet, 1-2 tablet, Oral, Q6H PRN, Verdene Lennert, MD, 2 tablet at 11/23/22 1605   hydrocortisone sodium succinate (SOLU-CORTEF) 100 MG injection 50  mg, 50 mg, Intravenous, Q6H, Harlon Ditty D, NP, 50 mg at 11/24/22 1138   HYDROmorphone (DILAUDID) injection 1 mg, 1 mg, Intravenous, Q4H PRN, Rust-Chester, Britton L, NP, 1 mg at 11/24/22 0859   insulin aspart (novoLOG) injection 0-20 Units, 0-20 Units, Subcutaneous, Q4H, Rust-Chester, Britton L, NP, 4 Units at 11/24/22 1138   insulin glargine-yfgn (SEMGLEE) injection 12 Units, 12 Units, Subcutaneous, QHS, Verdene Lennert, MD, 12 Units at 11/23/22 2122   megestrol (MEGACE) tablet 80 mg, 80 mg, Oral, BID, Rickard Patience, MD, 80 mg at 11/24/22 0859   ondansetron (ZOFRAN) tablet 4 mg, 4 mg, Oral, Q6H PRN **OR** ondansetron (ZOFRAN) injection 4 mg, 4 mg, Intravenous, Q6H PRN, Verdene Lennert, MD   pantoprazole (PROTONIX) injection 40 mg, 40 mg, Intravenous, Q12H, Harlon Ditty D, NP, 40 mg at 11/24/22 0859   phenylephrine (NEO-SYNEPHRINE) 20mg /NS premix infusion, 0-400 mcg/min, Intravenous, Titrated, Rust-Chester, Britton L, NP   pravastatin (PRAVACHOL) tablet 40 mg, 40 mg, Oral, q1800, Verdene Lennert, MD, 40 mg at 11/22/22 1818   senna (SENOKOT) tablet 8.6 mg, 1 tablet, Oral, Daily, Verdene Lennert, MD, 8.6 mg at 11/24/22 0859   sodium chloride 0.9 % bolus 500 mL, 500 mL, Intravenous, Once, Rust-Chester, Cecelia Byars, NP, Held at 11/23/22 0025   sodium chloride flush (NS) 0.9 % injection 3 mL, 3 mL, Intravenous, Q12H, Verdene Lennert, MD, 3 mL at 11/24/22 0904   Physical exam:  Vitals:   11/24/22 0800 11/24/22 0900 11/24/22 0929 11/24/22 1000  BP: 123/67 (!) 89/36 (!) 94/37 (!) 88/50  Pulse: (!) 118 (!) 112    Resp: 20 20 12 11   Temp: 97.6 F (36.4 C)     TempSrc: Axillary     SpO2: 95% 100%    Weight:      Height:       Physical Exam Constitutional:      General: He is not in acute distress.    Appearance: He is not diaphoretic.  HENT:     Head: Normocephalic and atraumatic.     Nose: Nose normal.  Eyes:     General: No scleral icterus. Cardiovascular:     Rate and Rhythm:  Normal rate and regular rhythm.     Heart sounds: No murmur heard. Pulmonary:     Effort: Pulmonary effort is normal. No respiratory distress.  Abdominal:  General: There is no distension.     Palpations: Abdomen is soft.     Tenderness: There is no abdominal tenderness.  Musculoskeletal:        General: Normal range of motion.     Cervical back: Normal range of motion and neck supple.  Skin:    General: Skin is warm and dry.  Neurological:     Mental Status: He is alert.     Cranial Nerves: No cranial nerve deficit.     Motor: No abnormal muscle tone.     Comments: Lethargic  Psychiatric:        Mood and Affect: Affect normal.       Labs    Latest Ref Rng & Units 11/24/2022   10:27 AM 11/24/2022    5:15 AM 11/23/2022   11:35 PM  CBC  WBC 4.0 - 10.5 K/uL  5.9    Hemoglobin 13.0 - 17.0 g/dL 6.7  7.1  7.2   Hematocrit 39.0 - 52.0 % 19.9  21.5  21.2   Platelets 150 - 400 K/uL  102        Latest Ref Rng & Units 11/24/2022    5:15 AM 11/22/2022   11:10 PM 11/22/2022    4:50 AM  CMP  Glucose 70 - 99 mg/dL 161  096  045   BUN 8 - 23 mg/dL 65  34  26   Creatinine 0.61 - 1.24 mg/dL 4.09  8.11  9.14   Sodium 135 - 145 mmol/L 143  138  138   Potassium 3.5 - 5.1 mmol/L 3.6  3.6  3.6   Chloride 98 - 111 mmol/L 110  105  105   CO2 22 - 32 mmol/L 26  26  28    Calcium 8.9 - 10.3 mg/dL 78.2  9.9  95.6   Total Protein 6.5 - 8.1 g/dL 5.0  4.1  5.8   Total Bilirubin 0.3 - 1.2 mg/dL 0.3  0.4  1.0   Alkaline Phos 38 - 126 U/L 60  54  61   AST 15 - 41 U/L 20  20  25    ALT 0 - 44 U/L 16  15  19       RADIOGRAPHIC STUDIES: I have personally reviewed the radiological images as listed and agreed with the findings in the report. MR BRAIN WO CONTRAST  Result Date: 11/24/2022 CLINICAL DATA:  Initial evaluation for mental status change, unknown cause. EXAM: MRI HEAD WITHOUT CONTRAST TECHNIQUE: Multiplanar, multiecho pulse sequences of the brain and surrounding structures were obtained  without intravenous contrast. COMPARISON:  Prior CT from 11/22/2022. FINDINGS: Brain: Mild age-related cerebral atrophy. No significant cerebral white matter disease for age. No other focal parenchymal signal abnormality. No evidence for acute or subacute ischemia. Gray-white matter differentiation maintained. No areas of chronic cortical infarction. No acute or chronic intracranial blood products. No mass lesion, midline shift or mass effect. No hydrocephalus or extra-axial fluid collection. Pituitary gland and suprasellar region within normal limits. Vascular: Major intracranial vascular flow voids are maintained. Skull and upper cervical spine: Craniocervical junction within normal limits. Diffusely decreased T1 signal intensity seen within the bone marrow of the visualized upper cervical spine, nonspecific, but most commonly related to anemia, smoking, or obesity. No visible focal marrow replacing lesion. No scalp soft tissue abnormality. Sinuses/Orbits: Prior bilateral ocular lens replacement. Paranasal sinuses are largely clear. Trace left mastoid effusion noted, bowel significance. Other: None. IMPRESSION: 1. No acute intracranial abnormality. 2. Mild age-related cerebral atrophy. Electronically Signed  By: Rise Mu M.D.   On: 11/24/2022 00:37   ECHOCARDIOGRAM COMPLETE  Result Date: 11/23/2022    ECHOCARDIOGRAM REPORT   Patient Name:   EWELL BENASSI Date of Exam: 11/23/2022 Medical Rec #:  [id number]        Height:       74.0 in Accession #:    [id number]       Weight:       221.1 lb Date of Birth:  04-27-1941        BSA:          2.268 m Patient Age:    82 years         BP:           105/58 mmHg Patient Gender: M                HR:           126 bpm. Exam Location:  [facility] Procedure: 2D Echo, Cardiac Doppler and Color Doppler Indications:     Syncope  History:         Patient has no prior history of Echocardiogram examinations.                  Arrythmias:Tachycardia, Signs/Symptoms:Syncope,  Altered Mental                  Status and Edema; Risk Factors:Hypertension and Diabetes.                  Prostate CA.  Sonographer:     Mikki Harbor Referring Phys:  [id number] BRITTON L RUST-CHESTER Diagnosing Phys: Debbe Odea MD  Sonographer Comments: Technically difficult study due to poor echo windows. Image acquisition challenging due to patient behavioral factors. IMPRESSIONS  1. Severe asymmetric basal septal hypertrophy. possible systolic anterior motion of mitral valve causing LVOT gradient. Consider CMR. tachycardia degrading image quality and more accurate assessment .Marland Kitchen Left ventricular ejection fraction, by estimation, is 70 to 75%. The left ventricle has hyperdynamic function. The left ventricle has no regional wall motion abnormalities. There is severe asymmetric left ventricular hypertrophy of the basal-septal segment. Left ventricular diastolic parameters are indeterminate.  2. Right ventricular systolic function is normal. The right ventricular size is normal. There is moderately elevated pulmonary artery systolic pressure.  3. A small pericardial effusion is present.  4. The mitral valve is normal in structure. Trivial mitral valve regurgitation.  5. The aortic valve is tricuspid. Aortic valve regurgitation is not visualized.  6. Aortic dilatation noted. There is borderline dilatation of the aortic root, measuring 38 mm. FINDINGS  Left Ventricle: Severe asymmetric basal septal hypertrophy. possible systolic anterior motion of mitral valve causing LVOT gradient. Consider CMR. tachycardia degrading image quality and more accurate assessment. Left ventricular ejection fraction, by estimation, is 70 to 75%. The left ventricle has hyperdynamic function. The left ventricle has no regional wall motion abnormalities. The left ventricular internal cavity size was normal in size. There is severe asymmetric left ventricular hypertrophy of  the basal-septal segment. Left ventricular diastolic  parameters are indeterminate. Right Ventricle: The right ventricular size is normal. No increase in right ventricular wall thickness. Right ventricular systolic function is normal. There is moderately elevated pulmonary artery systolic pressure. The tricuspid regurgitant velocity is 3.24 m/s, and with an assumed right atrial pressure of 8 mmHg, the estimated right ventricular systolic pressure is 50.0 mmHg. Left Atrium: Left atrial size was normal in size. Right Atrium: Right atrial size was normal in size.  Pericardium: A small pericardial effusion is present. Mitral Valve: The mitral valve is normal in structure. Trivial mitral valve regurgitation. MV peak gradient, 8.3 mmHg. The mean mitral valve gradient is 3.0 mmHg. Tricuspid Valve: The tricuspid valve is normal in structure. Tricuspid valve regurgitation is mild. Aortic Valve: The aortic valve is tricuspid. Aortic valve regurgitation is not visualized. Aortic valve mean gradient measures 5.0 mmHg. Aortic valve peak gradient measures 8.2 mmHg. Aortic valve area, by VTI measures 2.25 cm. Pulmonic Valve: The pulmonic valve was normal in structure. Pulmonic valve regurgitation is not visualized. Aorta: Aortic dilatation noted. There is borderline dilatation of the aortic root, measuring 38 mm. Venous: The inferior vena cava was not well visualized. IAS/Shunts: No atrial level shunt detected by color flow Doppler.  LEFT VENTRICLE PLAX 2D LVIDd:         3.10 cm LVIDs:         1.70 cm LV PW:         1.20 cm LV IVS:        2.10 cm LVOT diam:     2.00 cm LV SV:         49 LV SV Index:   21 LVOT Area:     3.14 cm  RIGHT VENTRICLE RV Basal diam:  2.85 cm RV Mid diam:    3.00 cm LEFT ATRIUM             Index        RIGHT ATRIUM           Index LA diam:        2.30 cm 1.01 cm/m   RA Area:     11.10 cm LA Vol (A2C):   23.1 ml 10.18 ml/m  RA Volume:   26.40 ml  11.64 ml/m LA Vol (A4C):   18.5 ml 8.16 ml/m LA Biplane Vol: 19.9 ml 8.77 ml/m  AORTIC VALVE                      PULMONIC VALVE AV Area (Vmax):    2.57 cm      PV Vmax:       1.75 m/s AV Area (Vmean):   2.04 cm      PV Peak grad:  12.2 mmHg AV Area (VTI):     2.25 cm AV Vmax:           143.00 cm/s AV Vmean:          103.000 cm/s AV VTI:            0.216 m AV Peak Grad:      8.2 mmHg AV Mean Grad:      5.0 mmHg LVOT Vmax:         117.00 cm/s LVOT Vmean:        66.900 cm/s LVOT VTI:          0.155 m LVOT/AV VTI ratio: 0.72  AORTA Ao Root diam: 3.80 cm MITRAL VALVE               TRICUSPID VALVE MV Area (PHT): 3.99 cm    TR Peak grad:   42.0 mmHg MV Area VTI:   3.16 cm    TR Vmax:        324.00 cm/s MV Peak grad:  8.3 mmHg MV Mean grad:  3.0 mmHg    SHUNTS MV Vmax:       1.44 m/s    Systemic VTI:  0.16 m MV Vmean:  66.8 cm/s   Systemic Diam: 2.00 cm MV Decel Time: 190 msec MV E velocity: 76.50 cm/s Debbe Odea MD Electronically signed by Debbe Odea MD Signature Date/Time: 11/23/2022/5:35:22 PM    Final    CT Angio Chest Pulmonary Embolism (PE) W or WO Contrast  Result Date: 11/23/2022 CLINICAL DATA:  Pulmonary embolism, progressive fatigue EXAM: CT ANGIOGRAPHY CHEST WITH CONTRAST TECHNIQUE: Multidetector CT imaging of the chest was performed using the standard protocol during bolus administration of intravenous contrast. Multiplanar CT image reconstructions and MIPs were obtained to evaluate the vascular anatomy. RADIATION DOSE REDUCTION: This exam was performed according to the departmental dose-optimization program which includes automated exposure control, adjustment of the mA and/or kV according to patient size and/or use of iterative reconstruction technique. CONTRAST:  OMNIPAQUE IOHEXOL 350 MG/ML SOLN COMPARISON:  None Available. FINDINGS: Cardiovascular: There is adequate opacification of the pulmonary arterial tree. No intraluminal filling defect identified to suggest acute pulmonary embolism. Central pulmonary arteries are of normal caliber. There is extensive multi-vessel coronary  artery calcification. Global cardiac size is within normal limits. Small pericardial effusion. Minimal atherosclerotic calcification within the thoracic aorta. No aortic aneurysm. Arch vasculature is widely patent proximally. Left internal jugular central venous catheter tip noted at the superior vena caval confluence. Mediastinum/Nodes: No enlarged mediastinal, hilar, or axillary lymph nodes. Thyroid gland, trachea, and esophagus demonstrate no significant findings. Lungs/Pleura: Moderate bilateral pleural effusions are present with bilateral dependent atelectasis. Aerated lungs are clear. No pneumothorax. Stable small calcified pleural plaques within the basilar pleural spaces bilaterally, nonspecific. Upper Abdomen: No acute abnormality Musculoskeletal: Stable compression deformities of a T5 and T6 diffuse mixed lytic and sclerotic metastatic disease is again seen throughout the visualized axial skeleton in keeping with patient's known metastatic prostate cancer. No acute bone abnormality. Review of the MIP images confirms the above findings. IMPRESSION: 1. No pulmonary embolism. 2. Extensive multi-vessel coronary artery calcification. 3. Moderate bilateral pleural effusions with bilateral dependent atelectasis. 4. Stable small calcified pleural plaques within the basilar pleural spaces bilaterally, nonspecific. 5. Stable diffuse mixed lytic and sclerotic metastatic disease throughout the visualized axial skeleton in keeping with patient's known metastatic prostate cancer. 6. Aortic atherosclerosis. Aortic Atherosclerosis (ICD10-I70.0). Electronically Signed   By: Helyn Numbers M.D.   On: 11/23/2022 00:02   CT HEAD WO CONTRAST ( )  Result Date: 11/22/2022 CLINICAL DATA:  Mental status change of unknown cause. Hypotension. Fatigue and malaise. EXAM: CT HEAD WITHOUT CONTRAST TECHNIQUE: Contiguous axial images were obtained from the base of the skull through the vertex without intravenous contrast. RADIATION  DOSE REDUCTION: This exam was performed according to the departmental dose-optimization program which includes automated exposure control, adjustment of the mA and/or kV according to patient size and/or use of iterative reconstruction technique. COMPARISON:  MRI brain 11/15/2022.  CT head 11/11/2022 FINDINGS: Brain: No evidence of acute infarction, hemorrhage, hydrocephalus, extra-axial collection or mass lesion/mass effect. Mild diffuse cerebral atrophy. Old lacunar infarct in the right basal ganglia. Vascular: No hyperdense vessel or unexpected calcification. Skull: Normal. Negative for fracture or focal lesion. Sinuses/Orbits: No acute finding. Other: None. IMPRESSION: No acute intracranial abnormalities. Mild cerebral atrophy. No significant change since previous studies. Electronically Signed   By: Burman Nieves M.D.   On: 11/22/2022 23:55   DG Chest Port 1 View  Result Date: 11/22/2022 CLINICAL DATA:  Central line placement EXAM: PORTABLE CHEST 1 VIEW COMPARISON:  11/09/2022 FINDINGS: A left central venous catheter has been placed with tip over the mid SVC region. No  pneumothorax. Shallow inspiration with linear atelectasis in the lung bases similar to prior study. Heart size and pulmonary vascularity are normal for technique. No pleural effusions. Mediastinal contours appear intact. IMPRESSION: A left central venous catheter has been placed with tip projecting over the mid SVC region. Shallow inspiration with atelectasis in the lung bases. Electronically Signed   By: Burman Nieves M.D.   On: 11/22/2022 23:32   EEG adult  Result Date: 11/21/2022 Jefferson Fuel, MD     11/21/2022  2:13 PM Routine EEG Report WYNN ALLDREDGE is a 82 y.o. male with a history of altered mental status who is undergoing an EEG to evaluate for seizures. Report: This EEG was acquired with electrodes placed according to the International 10-20 electrode system (including Fp1, Fp2, F3, F4, C3, C4, P3, P4, O1, O2, T3, T4,  T5, T6, A1, A2, Fz, Cz, Pz). The following electrodes were missing or displaced: none. The occipital dominant rhythm was 7 Hz. This activity is reactive to stimulation. Drowsiness was manifested by background fragmentation; deeper stages of sleep were identified by K complexes and sleep spindles. There was no focal slowing. There were no interictal epileptiform discharges. There were no electrographic seizures identified. Photic stimulation and hyperventilation were not performed. Impression and clinical correlation: This EEG was obtained while awake and asleep and is abnormal due to mild diffuse slowing indicative of global cerebral dysfunction. Epileptiform abnormalities were not seen during this recording. Bing Neighbors, MD [facility] [phone] If 7pm- 7am, please page neurology on call as listed in [facility]ION.   MR BRAIN W WO CONTRAST  Result Date: November 21, 2022 CLINICAL DATA:  Neuro deficit, acute, stroke suspected. Right worse than left sided weakness. EXAM: MRI HEAD WITHOUT AND WITH CONTRAST TECHNIQUE: Multiplanar, multiecho pulse sequences of the brain and surrounding structures were obtained without and with intravenous contrast. CONTRAST:  10mL GADAVIST GADOBUTROL 1 MMOL/ML IV SOLN COMPARISON:  CT studies same day. FINDINGS: Brain: Diffusion imaging does not show any acute or subacute infarction. No focal abnormality affects the brainstem or cerebellum. Cerebral hemispheres show age related volume loss without subjective lobar predominance. Patient shows only a very few punctate foci of T2 and FLAIR signal in the white matter, less than usually seen at this age. Dilated perivascular space incidental at the base of the brain on the right. No cortical or large vessel territory infarction. No mass lesion, hemorrhage, hydrocephalus or extra-axial collection. After contrast administration, no abnormal enhancement occurs. Vascular: Major vessels at the base of the brain show flow. Skull and upper  cervical spine: Negative Sinuses/Orbits: Clear/normal Other: None IMPRESSION: No acute finding by MRI. Age related volume loss. Minimal white matter changes, less than usually seen at this age. Electronically Signed   By: Paulina Fusi M.D.   On: 21-Nov-2022 13:11   DG Chest Port 1 View  Result Date: 2022/11/21 CLINICAL DATA:  Clammy and diaphoretic. EXAM: PORTABLE CHEST 1 VIEW COMPARISON:  November 13 2022 FINDINGS: Cardiomediastinal silhouette is normal. Mediastinal contours appear intact. There is no evidence of focal airspace consolidation, pleural effusion or pneumothorax. Low lung volumes. Osseous structures are without acute abnormality. Soft tissues are grossly normal. IMPRESSION: No active disease. Low lung volumes. Electronically Signed   By: Ted Mcalpine M.D.   On: November 21, 2022 11:43   CT ANGIO HEAD NECK W WO CM (CODE STROKE)  Result Date: November 21, 2022 CLINICAL DATA:  82 year old male code stroke presentation. Hypotensive. Prostate cancer. EXAM: CT ANGIOGRAPHY HEAD AND NECK TECHNIQUE: Multidetector CT imaging of the head  and neck was performed using the standard protocol during bolus administration of intravenous contrast. Multiplanar CT image reconstructions and MIPs were obtained to evaluate the vascular anatomy. Carotid stenosis measurements (when applicable) are obtained utilizing NASCET criteria, using the distal internal carotid diameter as the denominator. RADIATION DOSE REDUCTION: This exam was performed according to the departmental dose-optimization program which includes automated exposure control, adjustment of the mA and/or kV according to patient size and/or use of iterative reconstruction technique. CONTRAST:  75mL OMNIPAQUE IOHEXOL 350 MG/ML SOLN COMPARISON:  Plain head CT 1018 hours today. FINDINGS: CTA NECK Skeleton: Advanced cervical spine degeneration. No acute or suspicious osseous lesion identified. Upper chest: Small bilateral layering pleural effusions. Otherwise negative lung  apices. Negative visible superior mediastinum. Other neck: Negative. Aortic arch: Mildly bovine arch configuration. Minimal arch atherosclerosis. Right carotid system: Negative brachiocephalic and right CCA. Mild soft and calcified plaque at the right ICA origin but bulky calcified plaque at the right ICA bulb. 50 % stenosis with respect to the distal vessel (series 5, image 103). Patent right ICA to the skull base. Left carotid system: Negative left CCA. Minimal calcified plaque at the left carotid bifurcation and no stenosis. Vertebral arteries: Proximal right subclavian artery and right vertebral artery origin are normal. Right vertebral artery appears dominant and is patent to the skull base with no plaque or stenosis. Proximal left subclavian artery minimal plaque without stenosis. Calcified plaque at the left vertebral artery origin with mild to moderate stenosis on series 8, image 173. Non dominant left vertebral artery is patent to the skull base with no additional plaque or stenosis. CTA HEAD Posterior circulation: Patent distal vertebral arteries and vertebrobasilar junction with no significant plaque or stenosis. Patent PICA origins. Right V4 is dominant. Patent basilar artery, basilar tip, SCA and PCA origins without stenosis. Left posterior communicating artery is present, the right is diminutive or absent. Bilateral PCA branches are within normal limits. Anterior circulation: Both ICA siphons are patent. Left siphon mild calcified plaque with no significant stenosis. Normal left posterior communicating artery origin. Associated mild right supraclinoid segment stenosis. Patent carotid termini. Patent MCA and ACA origins. Tortuous A1 segments. Anterior communicating artery and bilateral ACA branches are within normal limits. Left MCA M1 segment and bifurcation are patent without stenosis. Right MCA M1 segment and bifurcation are patent without stenosis. Bilateral MCA branches appear symmetric and within  normal limits. Venous sinuses: Early contrast timing, not well evaluated. Anatomic variants: Dominant right vertebral artery. Review of the MIP images confirms the above findings IMPRESSION: 1. Negative for large vessel occlusion. Generally mild for age atherosclerosis in the head and neck. 2. Right ICA bulb bulky calcified plaque with 50% stenosis. Up to Moderate stenosis of the Non Dominant Left Vertebral Artery origin, and mild stenosis of the supraclinoid Right ICA stenosis are due to calcified plaque. 3. Small bilateral layering pleural effusions. Salient findings were communicated to Dr. Andree Elk at 10:38 am on 12/05/2022 by text page via the [facility] messaging system. Electronically Signed   By: Odessa Fleming M.D.   On: 11/06/2022 10:38   CT HEAD CODE STROKE WO CONTRAST  Result Date: 11/10/2022 CLINICAL DATA:  Code stroke. Neuro deficit, acute, stroke suspected. EXAM: CT HEAD WITHOUT CONTRAST TECHNIQUE: Contiguous axial images were obtained from the base of the skull through the vertex without intravenous contrast. RADIATION DOSE REDUCTION: This exam was performed according to the departmental dose-optimization program which includes automated exposure control, adjustment of the mA and/or kV according to patient size  and/or use of iterative reconstruction technique. COMPARISON:  09/21/2022 FINDINGS: Brain: No focal abnormality seen affecting the brainstem or cerebellum. There is an old right inferior basal ganglia infarction or dilated perivascular space. Chronic small-vessel ischemic changes affect the cerebral hemispheric white matter. No sign of acute infarction, mass lesion, hemorrhage, hydrocephalus or extra-axial collection. Vascular: There is atherosclerotic calcification of the major vessels at the base of the brain. Skull: Negative Sinuses/Orbits: Clear/normal Other: None ASPECTS (Alberta Stroke Program Early CT Score) - Ganglionic level infarction (caudate, lentiform nuclei, internal capsule, insula,  M1-M3 cortex): 7 - Supraganglionic infarction (M4-M6 cortex): 3 Total score (0-10 with 10 being normal): 10 IMPRESSION: 1. No acute finding by CT. Chronic small-vessel ischemic changes of the cerebral hemispheric white matter. 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 2. Aspects is 10. These results were communicated to Dr. Selina Cooley at 10:22 am on 12-12-2022 by text page via the [facility] messaging system. Electronically Signed   By: Paulina Fusi M.D.   On: 12-12-22 10:24   IR BONE MARROW BIOPSY & ASPIRATION  Result Date: 11/15/2022 INDICATION: Anemia EXAM: Bone marrow aspiration and core biopsy using fluoroscopic guidance MEDICATIONS: None. ANESTHESIA/SEDATION: Moderate (conscious) sedation was employed during this procedure. A total of Versed 1 mg and Fentanyl 25 mcg was administered intravenously. Moderate Sedation Time: 10 minutes. The patient's level of consciousness and vital signs were monitored continuously by radiology nursing throughout the procedure under my direct supervision. FLUOROSCOPY TIME:  Fluoroscopy Time: 0.5 minutes (8 mGy) COMPLICATIONS: None immediate. PROCEDURE: Informed written consent was obtained from the patient after a thorough discussion of the procedural risks, benefits and alternatives. All questions were addressed. Maximal Sterile Barrier Technique was utilized including caps, mask, sterile gowns, sterile gloves, sterile drape, hand hygiene and skin antiseptic. A timeout was performed prior to the initiation of the procedure. The patient was placed prone on the exam table. Limited fluoroscopy of the pelvis was performed for planning purposes. Skin entry site was marked, and the overlying skin was prepped and draped in the standard sterile fashion. Local analgesia was obtained with 1% lidocaine. Using fluoroscopic guidance, an 11 gauge needle was advanced just deep to the cortex of the right posterior ilium. Subsequently, bone marrow aspiration and core  biopsy were performed. Due to relatively dry tap, a second core biopsy was obtained. Specimens were submitted to lab/pathology for handling. Hemostasis was achieved with manual pressure, and a clean dressing was placed. The patient tolerated the procedure well without immediate complication. IMPRESSION: Successful bone marrow aspiration and core biopsy of the right posterior ilium. Note is made of a relatively dry tap, and an additional core specimen was obtained. Electronically Signed   By: Olive Bass M.D.   On: 11/15/2022 10:30   CT ABDOMEN PELVIS W CONTRAST  Result Date: 11/13/2022 CLINICAL DATA:  uti, possible pyelo, continues to spike fevers EXAM: CT ABDOMEN AND PELVIS WITH CONTRAST TECHNIQUE: Multidetector CT imaging of the abdomen and pelvis was performed using the standard protocol following bolus administration of intravenous contrast. RADIATION DOSE REDUCTION: This exam was performed according to the departmental dose-optimization program which includes automated exposure control, adjustment of the mA and/or kV according to patient size and/or use of iterative reconstruction technique. CONTRAST:  OMNIPAQUE IOHEXOL 300 MG/ML  SOLN COMPARISON:  CT 3 days ago 11/10/2022 FINDINGS: Lower chest: Trace bilateral pleural effusions and basilar atelectasis, right greater than left. Coronary artery calcifications/stents. Hepatobiliary: No suspicious hepatic lesion. Punctate granuloma in the subcapsular right lobe of  the liver. Gallbladder is only minimally distended. No calcified gallstone or biliary dilatation. Pancreas: Parenchymal atrophy. No ductal dilatation or inflammation. Spleen: Normal in size without focal abnormality. Adrenals/Urinary Tract: No adrenal nodule. Heterogeneous enhancement of the upper pole of the right kidney persists, suspicious for focal pyelonephritis. There is no perirenal or intrarenal fluid collection. Mild enhancement of the right ureter and left renal collecting system.  No renal calculi. Foley catheter decompresses the urinary bladder. There is circumferential bladder wall thickening. Mild residual perinephric fat stranding. Stomach/Bowel: No bowel obstruction or inflammation. Normal appendix. Moderate colonic stool burden. Vascular/Lymphatic: Aortic atherosclerosis. No aneurysm. No bulky abdominopelvic adenopathy. Reproductive: Enlarged prostate gland causing mass effect on the bladder base. Again seen clips in the prostate. Other: Small right greater than left fat containing inguinal hernias. Small fat containing umbilical hernia. No ascites or abdominopelvic collection. There is mild subcutaneous edema. Musculoskeletal: Stable osseous structures. The bones appear under mineralized. Transitional lumbosacral anatomy with 4 lumbar type vertebra. Stable T7 and upper lumbar minimal compression deformities. No intramuscular collection. IMPRESSION: 1. Heterogeneous enhancement of the upper pole of the right kidney persists, suspicious for focal pyelonephritis. There is no perirenal or intrarenal fluid collection. 2. Mild enhancement of the right ureter and left renal collecting system, suspicious for ascending urinary tract infection. 3. Circumferential bladder wall thickening with Foley catheter in place. 4. Enlarged prostate gland causing mass effect on the bladder base. 5. Trace bilateral pleural effusions and basilar atelectasis, right greater than left. 6. Additional stable chronic findings as described. Aortic Atherosclerosis (ICD10-I70.0). Electronically Signed   By: Narda Rutherford M.D.   On: 11/13/2022 15:42   DG Chest Port 1 View  Result Date: 11/13/2022 CLINICAL DATA:  Cough EXAM: PORTABLE CHEST 1 VIEW COMPARISON:  07/31/2022 FINDINGS: The heart size and mediastinal contours are within normal limits. Low lung volumes with elevation of the right hemidiaphragm. No focal airspace consolidation, pleural effusion, or pneumothorax. Known bony metastatic lesions, better seen on  previous PET-CT. IMPRESSION: No active disease. Electronically Signed   By: Duanne Guess D.O.   On: 11/13/2022 13:42   CT ABDOMEN PELVIS W CONTRAST  Result Date: 11/11/2022 CLINICAL DATA:  Acute abdominal pain.  Back pain.  Prostate cancer. EXAM: CT ABDOMEN AND PELVIS WITH CONTRAST TECHNIQUE: Multidetector CT imaging of the abdomen and pelvis was performed using the standard protocol following bolus administration of intravenous contrast. RADIATION DOSE REDUCTION: This exam was performed according to the departmental dose-optimization program which includes automated exposure control, adjustment of the mA and/or kV according to patient size and/or use of iterative reconstruction technique. CONTRAST:  OMNIPAQUE IOHEXOL 300 MG/ML  SOLN COMPARISON:  CT renal stone 10/13/2022.  PET-CT 10/09/2022. FINDINGS: Lower chest: There is a trace right pleural effusion. Hepatobiliary: No focal liver abnormality is seen. No gallstones, gallbladder wall thickening, or biliary dilatation. Pancreas: Unremarkable. No pancreatic ductal dilatation or surrounding inflammatory changes. Spleen: Normal in size without focal abnormality. Adrenals/Urinary Tract: Foley catheter is seen within bladder. There is marked diffuse bladder wall thickening with mild surrounding inflammation. Small amount of air in the bladder is likely related to Foley catheter placement. There is no hydronephrosis or perinephric fluid. There is some ill-defined patchy areas of hypoattenuation in the superior pole the right kidney which may represent pyelonephritis. There is no urinary tract calculus. The adrenal glands are within normal limits. Stomach/Bowel: Stomach is within normal limits. Appendix appears normal. No evidence of bowel wall thickening, distention, or inflammatory changes. Vascular/Lymphatic: Aortic atherosclerosis. No enlarged abdominal  or pelvic lymph nodes. Reproductive: Prostate gland is enlarged. Prostate radiotherapy seeds are  present. Other: There is a small fat containing right inguinal hernia. There is a small umbilical hernia containing nondilated bowel. There is no ascites. Small subcutaneous nodules in the anterior right abdominal wall are unchanged. Musculoskeletal: There is mild new compression deformity of the superior endplate of L2. Mild compression deformity of the superior endplate of L1 appears stable. Degenerative changes affect the spine and hips. IMPRESSION: 1. Marked diffuse bladder wall thickening with surrounding inflammation worrisome for cystitis. 2. Patchy areas of hypoattenuation in the superior pole the right kidney may represent pyelonephritis. 3. New acute mild compression deformity of the superior endplate of L2. 4. Trace right pleural effusion. Aortic Atherosclerosis (ICD10-I70.0). Electronically Signed   By: Darliss Cheney M.D.   On: 11/11/2022 00:02    Assessment and plan-   # Metastatic prostate cancer, with bone metastasis as well as bone marrow involvement. Severe anemia. He has progressed on Xtandi, not able to tolerate docetaxel chemotherapy. Next possible option is Pluvicto treatments, pending interventional radiology assessment.  He missed appointments due to being readmitted.  Discussed with Dr.Edmunds who thinks that he is a borderline candidate.  His remaining options are limited Awaiting NGS/molecular testing to see if he has HRD or dMMR- Continue Megace 80 mg twice daily as well as dexamethasone 0.5 mg daily.     #anemia, multifactorial due to CKD, marrow involvement of prostate cancer and recurrent infection.  PRBC transfusion to keep Hb>7 Epo therapy  -Epogen 30,000 unit x 1 given on 11/21/2022.   # Hypercalcemia, improved with IVF. This maybe due to bone metastasis.  Status post pamidronate 60mg  x1  Avoid calcium supplementation.  # Goals of care discussion.  It has been very difficult to stabilize his hemoglobin.  His condition is also declining. Chances are slim for him  to recover well and be able to receive additional treatments.  I had a lengthy discussion with patient, sister and his son. I recommend patient to consider comfort care/hospice.  Family would like to further discuss among them and may consider comfort care. Updated hospitalist and palliative care service.   Thank you for allowing me to participate in the care of this patient.   Rickard Patience, MD, PhD Hematology Oncology 11/24/2022

## 2022-11-24 NOTE — TOC Progression Note (Signed)
Transition of Care [facility]) - Progression Note    Patient Details  Name: DENY CHEVEZ MRN: [id number] Date of Birth: 1940-05-30  Transition of Care [facility]) CM/SW Contact  Kreg Shropshire, RN Phone Number: 11/24/2022, 2:28 PM  Clinical Narrative:     Cm received message from Dr. Nelson Chimes regarding hospice facility. Cm went to speak with family and they wanted to transition pt to hospice facility with [facility]. Cm reached out to [facility] with [facility] about referral.  Expected Discharge Plan: Home w Home Health Services Barriers to Discharge: Continued Medical Work up  Expected Discharge Plan and Services       Living arrangements for the past 2 months: Single Family Home                                       Social Determinants of Health (SDOH) Interventions SDOH Screenings   Food Insecurity: No Food Insecurity (11/18/2022)  Housing: Low Risk  (11/25/2022)  Transportation Needs: No Transportation Needs (12/06/2022)  Utilities: Not At Risk (12/06/2022)  Alcohol Screen: Low Risk  (05/12/2021)  Depression (PHQ2-9): Low Risk  (08/29/2021)  Financial Resource Strain: Low Risk  (05/12/2021)  Physical Activity: Insufficiently Active (05/12/2021)  Social Connections: Socially Integrated (05/12/2021)  Stress: No Stress Concern Present (05/12/2021)  Tobacco Use: Low Risk  (11/13/2022)    Readmission Risk Interventions    09/25/2022   12:18 PM  Readmission Risk Prevention Plan  Transportation Screening Complete  PCP or Specialist Appt within 3-5 Days Complete  HRI or Home Care Consult Complete  Social Work Consult for Recovery Care Planning/Counseling Complete  Palliative Care Screening Complete  Medication Review Oceanographer) Complete

## 2022-11-24 NOTE — Consult Note (Signed)
Cardiology Consultation   Patient ID: Keith Howe MRN: [id number]; DOB: 1940-11-11  Admit date: 12/04/2022 Date of Consult: 11/24/2022  PCP:  Keith Downs, MD   [facility] Providers Cardiologist:  None        Patient Profile:   Keith Howe is a 82 y.o. male with a hx of metastatic prostate cancer with extensive bony metastases, chronic anemia, type 2 diabetes and chronic kidney disease who is being seen 11/24/2022 for the evaluation of hypertrophic cardiomyopathy at the request of Dr. Karna Howe.  History of Present Illness:   Keith Howe is an 82 year old male with the above medical problems who presented on the 15th with poor oral intake, fatigue and flank pain.  He is known to have extensive bony metastases from prostate cancer with significant pain.  He was noted to be tachycardic and was found to have progressive anemia.  We are consulted for findings of hypertrophic cardiomyopathy on echocardiogram.  The patient became more hypotensive requiring pressor support. At the time I saw him, he was lethargic after he received Dilaudid for pain control.  I could not obtain history from him.  The family were in the process of considering CODE STATUS and, measures.   Past Medical History:  Diagnosis Date   BPH (benign prostatic hyperplasia)    Cancer (HCC)    Chronic kidney disease    STAGE 3   Diabetes mellitus without complication (HCC)    HOH (hard of hearing)    hearing aides   Hypertension    Inguinal hernia    Pain    CHRONIC LBP   Prostate cancer [facility])     Past Surgical History:  Procedure Laterality Date   BACK SURGERY     Lumbar   CATARACT EXTRACTION W/PHACO Right 07/10/2017   Procedure: CATARACT EXTRACTION PHACO AND INTRAOCULAR LENS PLACEMENT (IOC);  Surgeon: Galen Manila, MD;  Location: [facility] ORS;  Service: Ophthalmology;  Laterality: Right;  [place] 00:29.2 AP% 16.5 CDE 4.83 Fluid Pack Lot # [id number]   COLONOSCOPY     EYE SURGERY Bilateral     cataract extraction   HERNIA REPAIR     INGUINAL   IR BONE MARROW BIOPSY & ASPIRATION  11/15/2022   PROSTATE BIOPSY N/A 04/08/2020   Procedure: PROSTATE BIOPSY Addison Bailey;  Surgeon: Orson Ape, MD;  Location: [facility] ORS;  Service: Urology;  Laterality: N/A;   TOTAL KNEE ARTHROPLASTY Left 03/19/2018   Procedure: TOTAL KNEE ARTHROPLASTY;  Surgeon: Juanell Fairly, MD;  Location: [facility] ORS;  Service: Orthopedics;  Laterality: Left;     Home Medications:  Prior to Admission medications   Medication Sig Start Date End Date Taking? Authorizing Provider  aspirin EC 81 MG tablet Take 81 mg by mouth daily.   Yes [provider]  cholecalciferol (VITAMIN D) 1000 units tablet Take 1,000 Units by mouth daily.   Yes [provider]  ciprofloxacin (CIPRO) 500 MG tablet Take 1 tablet (500 mg total) by mouth 2 (two) times daily for 20 doses. 11/16/22 12/19/2022 Yes Enedina Finner, MD  dapagliflozin propanediol (FARXIGA) 10 MG TABS tablet Take 10 mg by mouth daily.   Yes [provider]  Iron-Vitamin C 65-125 MG TABS Take 1 tablet by mouth daily. 08/23/22  Yes Rickard Patience, MD  loratadine (CLARITIN) 10 MG tablet Take 10 mg by mouth daily as needed.   Yes [provider]  losartan (COZAAR) 100 MG tablet Take 100 mg by mouth daily. Hold for BP below 111.  Yes [provider]  lovastatin (MEVACOR) 40 MG tablet TAKE 1 TABLET BY MOUTH EVERY DAY 04/25/22  Yes Masoud, Renda Rolls, MD  metoprolol tartrate (LOPRESSOR) 50 MG tablet TAKE 1 TABLET BY MOUTH EVERY 12 HOURS Patient taking differently: Take 50 mg by mouth 2 (two) times daily. Hold for BP below 111. 11/25/21  Yes Masoud, Renda Rolls, MD  Misc Natural Products (PROSTATE SUPPORT PO) Take 2 capsules by mouth daily.   Yes [provider]  NOVOLOG FLEXPEN 100 UNIT/ML FlexPen Inject into the skin. Per sliding scale 01/03/18  Yes [provider]  Omega-3 Fatty Acids (FISH OIL PO) Take 1 capsule by mouth daily.    Yes [provider]  oxyCODONE (ROXICODONE) 5 MG immediate release tablet Take 1 tablet (5 mg total) by mouth every 8 (eight) hours as needed. 11/17/22 11/17/23 Yes Enedina Finner, MD  senna (SENOKOT) 8.6 MG TABS tablet Take 1 tablet (8.6 mg total) by mouth daily. 11/17/22  Yes Enedina Finner, MD  TRESIBA FLEXTOUCH 200 UNIT/ML FlexTouch Pen Inject 26 Units into the skin daily before breakfast. 11/16/22  Yes Enedina Finner, MD  TRULICITY 1.5 MG/0.5ML SOPN Inject 1.5 mg into the skin once a week. 08/10/22  Yes [provider]  vitamin B-12 (CYANOCOBALAMIN) 500 MCG tablet Take 500 mcg by mouth daily.   Yes [provider]  amLODipine (NORVASC) 5 MG tablet Take 5 mg by mouth daily. Patient not taking: Reported on 11/13/2022 11/16/22   [provider]    Inpatient Medications: Scheduled Meds:  sodium chloride   Intravenous Once   Chlorhexidine Gluconate Cloth  6 each Topical Daily   hydrocortisone sod succinate (SOLU-CORTEF) inj  50 mg Intravenous Q6H   megestrol  80 mg Oral BID   pantoprazole (PROTONIX) IV  40 mg Intravenous Q12H   senna  1 tablet Oral Daily   sodium chloride flush  3 mL Intravenous Q12H   Continuous Infusions:  cefTRIAXone (ROCEPHIN)  IV 200 mL/hr at 11/24/22 1000   sodium chloride Stopped (11/23/22 0025)   PRN Meds: acetaminophen **OR** acetaminophen, fentaNYL (SUBLIMAZE) injection, HYDROcodone-acetaminophen, HYDROmorphone (DILAUDID) injection, LORazepam, ondansetron **OR** ondansetron (ZOFRAN) IV  Allergies:   No Known Allergies  Social History:   Social History   Socioeconomic History   Marital status: Married    Spouse name: Not on file   Number of children: Not on file   Years of education: Not on file   Highest education level: Some college, no degree  Occupational History   Not on file  Tobacco Use   Smoking status: Never    Passive exposure: Never   Smokeless tobacco: Never  Vaping Use   Vaping status: Never Used  Substance and Sexual Activity    Alcohol use: No   Drug use: Never   Sexual activity: Not Currently  Other Topics Concern   Not on file  Social History Narrative   Independent at baseline.  Lives at home with his wife   Social Determinants of Health   Financial Resource Strain: Low Risk  (05/12/2021)   Overall Financial Resource Strain (CARDIA)    Difficulty of Paying Living Expenses: Not hard at all  Food Insecurity: No Food Insecurity (11/28/2022)   Hunger Vital Sign    Worried About Running Out of Food in the Last Year: Never true    Ran Out of Food in the Last Year: Never true  Transportation Needs: No Transportation Needs (11/15/2022)   PRAPARE - Administrator, Civil Service (Medical):  No    Lack of Transportation (Non-Medical): No  Physical Activity: Insufficiently Active (05/12/2021)   Exercise Vital Sign    Days of Exercise per Week: 7 days    Minutes of Exercise per Session: 10 min  Stress: No Stress Concern Present (05/12/2021)   [employer] of Occupational Health - Occupational Stress Questionnaire    Feeling of Stress : Not at all  Social Connections: Socially Integrated (05/12/2021)   Social Connection and Isolation Panel [NHANES]    Frequency of Communication with Friends and Family: More than three times a week    Frequency of Social Gatherings with Friends and Family: Once a week    Attends Religious Services: More than 4 times per year    Active Member of [employer] or Organizations: Yes    Attends Engineer, structural: More than 4 times per year    Marital Status: Married  Catering manager Violence: Not At Risk (11/09/2022)   Humiliation, Afraid, Rape, and Kick questionnaire    Fear of Current or Ex-Partner: No    Emotionally Abused: No    Physically Abused: No    Sexually Abused: No    Family History:    Family History  Problem Relation Age of Onset   Diabetes Father    Breast cancer Sister      ROS:  Please see the history of present illness.   All other ROS  reviewed and negative.     Physical Exam/Data:   Vitals:   11/24/22 0800 11/24/22 0900 11/24/22 0929 11/24/22 1000  BP: 123/67 (!) 89/36 (!) 94/37 (!) 88/50  Pulse: (!) 118 (!) 112    Resp: 20 20 12 11   Temp: 97.6 F (36.4 C)     TempSrc: Axillary     SpO2: 95% 100%    Weight:      Height:        Intake/Output Summary (Last 24 hours) at 11/24/2022 1537 Last data filed at 11/24/2022 1000 Gross per 24 hour  Intake 631.78 ml  Output 1070 ml  Net -438.22 ml      11/12/2022    8:50 PM 11/15/2022    8:48 AM 11/11/2022    1:55 AM  Last 3 Weights  Weight (lbs) 221 lb 1.9 oz 224 lb 3.3 oz 224 lb 3.2 oz  Weight (kg) 100.3 kg 101.7 kg 101.696 kg     Body mass index is 28.39 kg/m.  General:   Critically ill-appearing and lethargic HEENT: normal Neck: no JVD Vascular: No carotid bruits; Distal pulses 2+ bilaterally Cardiac:  normal S1, S2; RRR; 2 out of 6 systolic murmur at the left sternal border Lungs:  clear to auscultation bilaterally, no wheezing, rhonchi or rales  Abd: soft, nontender, no hepatomegaly  Ext: no edema Musculoskeletal:  No deformities, BUE and BLE strength normal and equal Skin: warm and dry  Neuro: Not able to evaluate due to lethargy.  EKG:  The EKG was personally reviewed and demonstrates: Sinus rhythm with LVH Telemetry:  Telemetry was personally reviewed and demonstrates: Sinus rhythm with sinus tachycardia  Relevant CV Studies: I personally reviewed his echocardiogram from yesterday which showed hyperdynamic LV systolic function with severe asymmetric septal hypertrophy, systolic anterior motion of the mitral valve and moderately elevated pulmonary pressure.  Laboratory Data:  High Sensitivity Troponin:   Recent Labs  Lab 11/19/2022 1006 11/14/2022 1445 11/23/22 1728 11/24/22 0515  TROPONINIHS 24* 22* 44* 36*     Chemistry Recent Labs  Lab 11/22/22 0450 11/22/22 2310 11/24/22  0515  NA 138 138 143  K 3.6 3.6 3.6  CL 105 105 110  CO2 28 26 26    GLUCOSE 147* 249* 251*  BUN 26* 34* 65*  CREATININE 1.27* 1.60* 1.70*  CALCIUM 11.2* 9.9 10.1  MG  --  1.6* 2.0  GFRNONAA 57* 43* 40*  ANIONGAP 5 7 7     Recent Labs  Lab 11/22/22 0450 11/22/22 2310 11/24/22 0515  PROT 5.8* 4.1* 5.0*  ALBUMIN 2.2* 1.6* 2.0*  2.1*  AST 25 20 20   ALT 19 15 16   ALKPHOS 61 54 60  BILITOT 1.0 0.4 0.3   Lipids No results for input(s): "CHOL", "TRIG", "HDL", "LABVLDL", "LDLCALC", "CHOLHDL" in the last 168 hours.  Hematology Recent Labs  Lab 11/22/22 2310 11/23/22 0357 11/23/22 1728 11/23/22 2335 11/24/22 0515 11/24/22 1027  WBC 8.1 10.6*  --   --  5.9  --   RBC 1.87* 2.74*  --   --  2.52*  --   HGB 5.2* 7.7*   < > 7.2* 7.1* 6.7*  HCT 16.1* 23.0*   < > 21.2* 21.5* 19.9*  MCV 86.1 83.9  --   --  85.3  --   MCH 27.8 28.1  --   --  28.2  --   MCHC 32.3 33.5  --   --  33.0  --   RDW 17.2* 17.0*  --   --  15.9*  --   PLT 137* 155  --   --  102*  --    < > = values in this interval not displayed.   Thyroid No results for input(s): "TSH", "FREET4" in the last 168 hours.  BNP Recent Labs  Lab 11/22/22 2310  BNP 442.2*    DDimer No results for input(s): "DDIMER" in the last 168 hours.   Radiology/Studies:  MR BRAIN WO CONTRAST  Result Date: 11/24/2022 CLINICAL DATA:  Initial evaluation for mental status change, unknown cause. EXAM: MRI HEAD WITHOUT CONTRAST TECHNIQUE: Multiplanar, multiecho pulse sequences of the brain and surrounding structures were obtained without intravenous contrast. COMPARISON:  Prior CT from 11/22/2022. FINDINGS: Brain: Mild age-related cerebral atrophy. No significant cerebral white matter disease for age. No other focal parenchymal signal abnormality. No evidence for acute or subacute ischemia. Gray-white matter differentiation maintained. No areas of chronic cortical infarction. No acute or chronic intracranial blood products. No mass lesion, midline shift or mass effect. No hydrocephalus or extra-axial fluid  collection. Pituitary gland and suprasellar region within normal limits. Vascular: Major intracranial vascular flow voids are maintained. Skull and upper cervical spine: Craniocervical junction within normal limits. Diffusely decreased T1 signal intensity seen within the bone marrow of the visualized upper cervical spine, nonspecific, but most commonly related to anemia, smoking, or obesity. No visible focal marrow replacing lesion. No scalp soft tissue abnormality. Sinuses/Orbits: Prior bilateral ocular lens replacement. Paranasal sinuses are largely clear. Trace left mastoid effusion noted, bowel significance. Other: None. IMPRESSION: 1. No acute intracranial abnormality. 2. Mild age-related cerebral atrophy. Electronically Signed   By: Rise Mu M.D.   On: 11/24/2022 00:37   ECHOCARDIOGRAM COMPLETE  Result Date: 11/23/2022    ECHOCARDIOGRAM REPORT   Patient Name:   Keith Howe Date of Exam: 11/23/2022 Medical Rec #:  [id number]        Height:       74.0 in Accession #:    [id number]       Weight:       221.1 lb Date of Birth:  Mar 29, 1941  BSA:          2.268 m Patient Age:    82 years         BP:           105/58 mmHg Patient Gender: M                HR:           126 bpm. Exam Location:  [facility] Procedure: 2D Echo, Cardiac Doppler and Color Doppler Indications:     Syncope  History:         Patient has no prior history of Echocardiogram examinations.                  Arrythmias:Tachycardia, Signs/Symptoms:Syncope, Altered Mental                  Status and Edema; Risk Factors:Hypertension and Diabetes.                  Prostate CA.  Sonographer:     Mikki Harbor Referring Phys:  [id number] BRITTON L RUST-CHESTER Diagnosing Phys: Debbe Odea MD  Sonographer Comments: Technically difficult study due to poor echo windows. Image acquisition challenging due to patient behavioral factors. IMPRESSIONS  1. Severe asymmetric basal septal hypertrophy. possible systolic anterior motion of mitral  valve causing LVOT gradient. Consider CMR. tachycardia degrading image quality and more accurate assessment .Marland Kitchen Left ventricular ejection fraction, by estimation, is 70 to 75%. The left ventricle has hyperdynamic function. The left ventricle has no regional wall motion abnormalities. There is severe asymmetric left ventricular hypertrophy of the basal-septal segment. Left ventricular diastolic parameters are indeterminate.  2. Right ventricular systolic function is normal. The right ventricular size is normal. There is moderately elevated pulmonary artery systolic pressure.  3. A small pericardial effusion is present.  4. The mitral valve is normal in structure. Trivial mitral valve regurgitation.  5. The aortic valve is tricuspid. Aortic valve regurgitation is not visualized.  6. Aortic dilatation noted. There is borderline dilatation of the aortic root, measuring 38 mm. FINDINGS  Left Ventricle: Severe asymmetric basal septal hypertrophy. possible systolic anterior motion of mitral valve causing LVOT gradient. Consider CMR. tachycardia degrading image quality and more accurate assessment. Left ventricular ejection fraction, by estimation, is 70 to 75%. The left ventricle has hyperdynamic function. The left ventricle has no regional wall motion abnormalities. The left ventricular internal cavity size was normal in size. There is severe asymmetric left ventricular hypertrophy of  the basal-septal segment. Left ventricular diastolic parameters are indeterminate. Right Ventricle: The right ventricular size is normal. No increase in right ventricular wall thickness. Right ventricular systolic function is normal. There is moderately elevated pulmonary artery systolic pressure. The tricuspid regurgitant velocity is 3.24 m/s, and with an assumed right atrial pressure of 8 mmHg, the estimated right ventricular systolic pressure is 50.0 mmHg. Left Atrium: Left atrial size was normal in size. Right Atrium: Right atrial size  was normal in size. Pericardium: A small pericardial effusion is present. Mitral Valve: The mitral valve is normal in structure. Trivial mitral valve regurgitation. MV peak gradient, 8.3 mmHg. The mean mitral valve gradient is 3.0 mmHg. Tricuspid Valve: The tricuspid valve is normal in structure. Tricuspid valve regurgitation is mild. Aortic Valve: The aortic valve is tricuspid. Aortic valve regurgitation is not visualized. Aortic valve mean gradient measures 5.0 mmHg. Aortic valve peak gradient measures 8.2 mmHg. Aortic valve area, by VTI measures 2.25 cm. Pulmonic Valve: The pulmonic valve was normal in  structure. Pulmonic valve regurgitation is not visualized. Aorta: Aortic dilatation noted. There is borderline dilatation of the aortic root, measuring 38 mm. Venous: The inferior vena cava was not well visualized. IAS/Shunts: No atrial level shunt detected by color flow Doppler.  LEFT VENTRICLE PLAX 2D LVIDd:         3.10 cm LVIDs:         1.70 cm LV PW:         1.20 cm LV IVS:        2.10 cm LVOT diam:     2.00 cm LV SV:         49 LV SV Index:   21 LVOT Area:     3.14 cm  RIGHT VENTRICLE RV Basal diam:  2.85 cm RV Mid diam:    3.00 cm LEFT ATRIUM             Index        RIGHT ATRIUM           Index LA diam:        2.30 cm 1.01 cm/m   RA Area:     11.10 cm LA Vol (A2C):   23.1 ml 10.18 ml/m  RA Volume:   26.40 ml  11.64 ml/m LA Vol (A4C):   18.5 ml 8.16 ml/m LA Biplane Vol: 19.9 ml 8.77 ml/m  AORTIC VALVE                     PULMONIC VALVE AV Area (Vmax):    2.57 cm      PV Vmax:       1.75 m/s AV Area (Vmean):   2.04 cm      PV Peak grad:  12.2 mmHg AV Area (VTI):     2.25 cm AV Vmax:           143.00 cm/s AV Vmean:          103.000 cm/s AV VTI:            0.216 m AV Peak Grad:      8.2 mmHg AV Mean Grad:      5.0 mmHg LVOT Vmax:         117.00 cm/s LVOT Vmean:        66.900 cm/s LVOT VTI:          0.155 m LVOT/AV VTI ratio: 0.72  AORTA Ao Root diam: 3.80 cm MITRAL VALVE               TRICUSPID VALVE  MV Area (PHT): 3.99 cm    TR Peak grad:   42.0 mmHg MV Area VTI:   3.16 cm    TR Vmax:        324.00 cm/s MV Peak grad:  8.3 mmHg MV Mean grad:  3.0 mmHg    SHUNTS MV Vmax:       1.44 m/s    Systemic VTI:  0.16 m MV Vmean:      66.8 cm/s   Systemic Diam: 2.00 cm MV Decel Time: 190 msec MV E velocity: 76.50 cm/s Debbe Odea MD Electronically signed by Debbe Odea MD Signature Date/Time: 11/23/2022/5:35:22 PM    Final    CT Angio Chest Pulmonary Embolism (PE) W or WO Contrast  Result Date: 11/23/2022 CLINICAL DATA:  Pulmonary embolism, progressive fatigue EXAM: CT ANGIOGRAPHY CHEST WITH CONTRAST TECHNIQUE: Multidetector CT imaging of the chest was performed using the standard protocol during bolus administration of intravenous contrast. Multiplanar CT  image reconstructions and MIPs were obtained to evaluate the vascular anatomy. RADIATION DOSE REDUCTION: This exam was performed according to the departmental dose-optimization program which includes automated exposure control, adjustment of the mA and/or kV according to patient size and/or use of iterative reconstruction technique. CONTRAST:  OMNIPAQUE IOHEXOL 350 MG/ML SOLN COMPARISON:  None Available. FINDINGS: Cardiovascular: There is adequate opacification of the pulmonary arterial tree. No intraluminal filling defect identified to suggest acute pulmonary embolism. Central pulmonary arteries are of normal caliber. There is extensive multi-vessel coronary artery calcification. Global cardiac size is within normal limits. Small pericardial effusion. Minimal atherosclerotic calcification within the thoracic aorta. No aortic aneurysm. Arch vasculature is widely patent proximally. Left internal jugular central venous catheter tip noted at the superior vena caval confluence. Mediastinum/Nodes: No enlarged mediastinal, hilar, or axillary lymph nodes. Thyroid gland, trachea, and esophagus demonstrate no significant findings. Lungs/Pleura: Moderate  bilateral pleural effusions are present with bilateral dependent atelectasis. Aerated lungs are clear. No pneumothorax. Stable small calcified pleural plaques within the basilar pleural spaces bilaterally, nonspecific. Upper Abdomen: No acute abnormality Musculoskeletal: Stable compression deformities of a T5 and T6 diffuse mixed lytic and sclerotic metastatic disease is again seen throughout the visualized axial skeleton in keeping with patient's known metastatic prostate cancer. No acute bone abnormality. Review of the MIP images confirms the above findings. IMPRESSION: 1. No pulmonary embolism. 2. Extensive multi-vessel coronary artery calcification. 3. Moderate bilateral pleural effusions with bilateral dependent atelectasis. 4. Stable small calcified pleural plaques within the basilar pleural spaces bilaterally, nonspecific. 5. Stable diffuse mixed lytic and sclerotic metastatic disease throughout the visualized axial skeleton in keeping with patient's known metastatic prostate cancer. 6. Aortic atherosclerosis. Aortic Atherosclerosis (ICD10-I70.0). Electronically Signed   By: Helyn Numbers M.D.   On: 11/23/2022 00:02   CT HEAD WO CONTRAST ( )  Result Date: 11/22/2022 CLINICAL DATA:  Mental status change of unknown cause. Hypotension. Fatigue and malaise. EXAM: CT HEAD WITHOUT CONTRAST TECHNIQUE: Contiguous axial images were obtained from the base of the skull through the vertex without intravenous contrast. RADIATION DOSE REDUCTION: This exam was performed according to the departmental dose-optimization program which includes automated exposure control, adjustment of the mA and/or kV according to patient size and/or use of iterative reconstruction technique. COMPARISON:  MRI brain 2022-12-10.  CT head 2022-12-10 FINDINGS: Brain: No evidence of acute infarction, hemorrhage, hydrocephalus, extra-axial collection or mass lesion/mass effect. Mild diffuse cerebral atrophy. Old lacunar infarct in the right  basal ganglia. Vascular: No hyperdense vessel or unexpected calcification. Skull: Normal. Negative for fracture or focal lesion. Sinuses/Orbits: No acute finding. Other: None. IMPRESSION: No acute intracranial abnormalities. Mild cerebral atrophy. No significant change since previous studies. Electronically Signed   By: Burman Nieves M.D.   On: 11/22/2022 23:55   DG Chest Port 1 View  Result Date: 11/22/2022 CLINICAL DATA:  Central line placement EXAM: PORTABLE CHEST 1 VIEW COMPARISON:  Dec 10, 2022 FINDINGS: A left central venous catheter has been placed with tip over the mid SVC region. No pneumothorax. Shallow inspiration with linear atelectasis in the lung bases similar to prior study. Heart size and pulmonary vascularity are normal for technique. No pleural effusions. Mediastinal contours appear intact. IMPRESSION: A left central venous catheter has been placed with tip projecting over the mid SVC region. Shallow inspiration with atelectasis in the lung bases. Electronically Signed   By: Burman Nieves M.D.   On: 11/22/2022 23:32   EEG adult  Result Date: 11/21/2022 Jefferson Fuel, MD     11/21/2022  2:13 PM Routine EEG Report TABB CROGHAN is a 82 y.o. male with a history of altered mental status who is undergoing an EEG to evaluate for seizures. Report: This EEG was acquired with electrodes placed according to the International 10-20 electrode system (including Fp1, Fp2, F3, F4, C3, C4, P3, P4, O1, O2, T3, T4, T5, T6, A1, A2, Fz, Cz, Pz). The following electrodes were missing or displaced: none. The occipital dominant rhythm was 7 Hz. This activity is reactive to stimulation. Drowsiness was manifested by background fragmentation; deeper stages of sleep were identified by K complexes and sleep spindles. There was no focal slowing. There were no interictal epileptiform discharges. There were no electrographic seizures identified. Photic stimulation and hyperventilation were not performed.  Impression and clinical correlation: This EEG was obtained while awake and asleep and is abnormal due to mild diffuse slowing indicative of global cerebral dysfunction. Epileptiform abnormalities were not seen during this recording. Bing Neighbors, MD [facility] [phone] If 7pm- 7am, please page neurology on call as listed in AMION.     Assessment and Plan:   Hypertrophic obstructive cardiomyopathy: The patient's echocardiogram is highly suggestive of this and there is evidence of LVOT obstruction that likely worsened in the setting of severe anemia and tachycardia.  This can be improved with transfusion, avoiding volume depletion and avoiding pressors that can cause tachycardia.  He was on norepinephrine drip but he is off now.  Neo-Synephrine is a better option. Regardless, the patient has advanced metastatic prostate cancer with plans to transition to comfort care.    For questions or updates, please contact [facility] Please consult [email or web address] for contact info under    Signed, Lorine Bears, MD  11/24/2022 3:37 PM

## 2022-11-24 NOTE — IPAL (Signed)
GOALS OF CARE FAMILY CONFERENCE   Current clinical status, hospital findings and medical plan was reviewed with family.   Updated and notified of patients ongoing immediate critical medical problems.   Patient remains with AMS encephalopathy poorly responsive    Patient is unable to breathe independently, unable to protect airway and unable to mobilize secretions.    Explained to family course of therapy and the modalities   Patient with Progressive multiorgan failure with high probability of a very minimal chance of meaningful recovery despite aggressive and optimal medical therapy.   Family is appreciative of care and relate understanding that patient is severely critically ill with anticipation of passing away during this hospitalization.   They have consented and agreed to DNR/DNI COMFORT CARE Code status   Family are satisfied with Plan of action and management. All questions answered  Additional Critical Care time 35 mins    Vida Rigger, M.D.  Pulmonary & Critical Care Medicine  [facility]

## 2022-11-24 NOTE — Progress Notes (Signed)
Patient resting intermittently with Son at bedside. Blood pressure currently within defined limits with NEO off. Medicated for pain with success.

## 2022-11-24 NOTE — Assessment & Plan Note (Signed)
Currently unresponsive.

## 2022-11-24 NOTE — Progress Notes (Signed)
NAME:  Keith Howe, MRN:  [id number], DOB:  Sep 05, 1940, LOS: 4 ADMISSION DATE:  11/25/2022, CONSULTATION DATE:  11/22/22 REFERRING MD:  Dr. Para March, CHIEF COMPLAINT:  Weakness   History of Present Illness:  82 yo M presenting to [facility] ED from the cancer center on 11/14/2022 for evaluation of weakness.  History provided per chart review and spouse report. Patient was in his normal state of health until he went to have lab work drawn at the cancer center on 11/08/2022. His wife was called back by staff who noted him to be minimally responsive, diaphoretic & hypotensive (63/45), CBG was stable at 200. Patient was recently discharged on 11/16/22 after an admission for pyelonephritis secondary to chronic foley catheter. Wife also reported fatigue and poor PO intake. No other complaints.   ED course: On arrival to the ED, patient's blood pressure had improved to 109/63 but did decrease as low as 95/50.  He was noted to be tachycardic with heart rate of 116.  He was afebrile at 98.1.  Initial workup demonstrated hemoglobin of 9.1, creatinine of 1.67, BUN 36 with GFR 41, calcium of 12.1, lactic acid of 2.6.  Troponin elevated at 24 with flat trend to 22.  INR elevated at 1.3.  Urinalysis demonstrated moderate leukocytes, rare bacteria with hematuria.  UDS negative.  CT head, CTA head/neck and MRI of the brain were obtained with no evidence of acute infarct or LVO.   Admitted for concern of sepsis secondary to UTI and started on cefepime.    ED course: Upon arrival patient's BP had improved to 109/63, he was mildly tachycardic and afebrile. Labs significant for hyperglycemia, hypercalcemia, hypoalbuminemia, mildly elevated but flat troponin, lactic acidosis without leukocytosis with baseline CKD 3b & anemia. UA +mod. Leuks, rare bacteria & hematuria. UDS negative. Patient worked up for stroke due to mentation changes. CTH, CTA head/neck and MRI brain obtained and negative for acute infarct or LVO. Patient admitted  by [facility] to PCU for concern of sepsis s/t UTI.  Hospital Course: Sepsis & acute CVA ruled out, EEG negative for seizure. Anti-hypertensive medication re-started 7/17 and overnight patient had a similar event to admission story where he became minimally responsive, hypotensive & diaphoretic- transferred to ICU and PCCM consulted. See significant events for more detail.  Recent Significant labs: (Labs/ Imaging personally reviewed) I, Cheryll Cockayne Rust-Chester, AGACNP-BC, personally viewed and interpreted this ECG. EKG Interpretation: Date: 11/22/2022, EKG Time: 22:10, Rate: 88, Rhythm: NSR, QRS Axis: Normal, Intervals: Normal, ST/T Wave abnormalities: Nonspecific T wave abnormalities, Narrative Interpretation: NSR Chemistry: Na+: 138, K+: 3.6, BUN/Cr.: 26/ 1.27, Serum CO2/ AG: 28/ 5, Ca: 11.2, albumin: 2.2 Hematology: WBC: 5.4, Hgb: 8.3, plt: 132  Troponin: 24 > 22, Lactic: 2.6 > 2.1, COVID-19 & Influenza A/B: negative  CXR 11/19/2022: no active disease. Low lung volumes CT head code stroke without contrast 11/13/2022: No acute finding by CT chronic small vessel ischemic changes of the cerebral hemispheric white matter.  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  Aspects is 10. CT angio head/neck code stroke 11/16/2022: Negative for LVO.  Generally mild for age atherosclerosis in the head and neck.  Right ICA bulb bulky calcified plaque with 50% stenosis.  Up to moderate stenosis of the nondominant left vertebral artery origin and mild stenosis of the supraclinoid right ICA stenosis are due to calcified plaque.  Small bilateral layering pleural effusions. MRI brain without contrast 11/11/2022: No acute finding by MRI, age-related volume loss, minimal white matter  changes less than usually seen at this age.  CT head without contrast 11/22/2022: No acute intracranial abnormalities.  Mild cerebral atrophy.  No significant change since previous study. CT angio chest 11/22/2022: No  pulmonary embolism.  Extensive multivessel CAD.  Moderate bilateral pleural effusions with bilateral dependent atelectasis.  Stable small calcified pleural plaques within the basilar pleural spaces bilaterally nonspecific.  Stable diffuse mixed lytic and sclerotic metastatic disease throughout the visualized axial skeleton in keeping with the patient's known metastatic prostate cancer.  Aortic atherosclerosis.  PCCM consulted for assistance in management and monitoring due to circulatory shock requiring vasopressor support.  Pertinent  Medical History  Stage IVb metastatic prostate cancer Chronic Iron deficiency Anemia  T2DM CKD stage 3b MGUS BPH HTN HOH with hearing aides  Significant Hospital Events: Including procedures, antibiotic start and stop dates in addition to other pertinent events   7/16; vitals stable, improving renal function, hemoglobin decreased to 7 but all cell lines decreased, likely some dilutional effect.  Recent bone marrow biopsy of at 20% malignant cells and pancytopenia-ordered 1 unit of PRBC after discussing with patient and family Palliative care from cancer center was also consulted due to grave prognosis with stage IV prostatic cancer with extensive bony mets. Preliminary blood cultures negative in 12 hours, urine cultures pending.  7/17: Urine and blood cultures negative.  EEG with generalized slowing and no epileptiform discharge, it was ordered in ED for concern of altered mental status.  Patient was on antibiotics for total of 14 days due to recent pyelonephritis, cefepime switched with ceftriaxone and we will complete that course, day 11 today.  Clinically stable, restarting home antihypertensives. Palliative care is on board and patient can be discharged home likely tomorrow once hospital bed is delivered.  Prognosis remained poor at this time. Patient was given a dose of pamidronate by oncology with some improvement in hypercalcemia. Overnight patient noted  to be suddenly minimally responsive and significantly hypotensive with SBP dropping from 130's to 50's. He was urgently transferred to ICU and placed on vasopressor support. PCCM consulted.  11/23/22- patient remains altered.  He remains hypotensive. I met with family and we reviewed care plan and findings.  11/24/22- s/p med/oncology evaluation.  Patient has poor prognosis.  Appears to have relative pancytopenia.  He also was found to have abnormal  TTE.   We will perform GOC today for possible comfort care measures.   Interim History / Subjective:  Patient drowsy but responsive to voice, significantly hard of hearing without hearing aids present but able to interact and move all extremities.  BP on arrival to ICU still significantly hypotensive with SBP in the 50s.  IV fluid bolus initiated and Levophed drip initiated with emergent central line placement due to lack of stable access. Ongoing family updates, concerns answered throughout.  Objective   Blood pressure 115/69, pulse (!) 117, temperature 97.8 F (36.6 C), temperature source Axillary, resp. rate (!) 24, height 6\' 2"  (1.88 m), weight 100.3 kg, SpO2 100%.        Intake/Output Summary (Last 24 hours) at 11/24/2022 1019 Last data filed at 11/24/2022 0500 Gross per 24 hour  Intake 693.12 ml  Output 1060 ml  Net -366.88 ml   Filed Weights   11/17/2022 2050  Weight: 100.3 kg    Examination: General: Adult male, critically ill, lying in bed, NAD HEENT: MM pink/moist, anicteric, atraumatic, neck supple, significantly HOH without hearing aids present Neuro: RASS -1, A&O x 1-2, able to follow simple persistent commands,  PERRL +3, MAE CV: s1s2 RRR, NSR on monitor, no r/m/g Pulm: Regular, non labored on RA, breath sounds coarse-BUL & diminished-BLL GI: soft, rounded, non tender, bs x 4 GU: foley in place with clear yellow urine Skin: no rashes/lesions noted Extremities: warm/dry, pulses + 2 R/P, no edema noted  Resolved Hospital  Problem list   Sepsis ruled out Acute CVA ruled out  Assessment & Plan:  Circulatory shock suspect multifactorial in the setting of restarting antihypertensive medication, acute anemia & suspected vasovagal Hypoalbuminemia in the setting of poor PO intake PMHx: HTN Stat CTA ruled out PE, stat CBC shows acute anemia with hemoglobin drop to 5.2.  Believe this to be acute on chronic anemia in the setting of metastatic prostate cancer as lactic has normalized.  Mentation has improved greatly once blood pressure stabilized.  Stat CT head negative. Stat labs significant for acute on chronic anemia with hemoglobin of 5.2, hypomagnesemia at 1.6, mildly elevated PCT of 0.29, elevated BNP of 442 & hypoalbuminemia at 1.6.  Otherwise lactic WNL, renal function slightly worse but still close to baseline & mild hyperglycemia. -Initiate Levophed drip, wean as tolerated to maintain MAP > 65 -Patient received 1.5 L IV fluid bolus, conservative fluids from this point on due to elevated BNP -Consider conservative albumin supplementation  -Echocardiogram ordered to rule out new onset CHF or valvular disease -Losartan and metoprolol discontinued  Moderate bilateral pleural effusions with bilateral dependent atelectasis -appear larger since admission.  May need to consider thoracentesis evaluation, patient currently asymptomatic on room air. - Aggressive pulmonary toileting -Continuous pulse oximetry monitoring -Supplemental oxygen PRN, to maintain SpO2 > 90%  Acute encephalopathy Concern for possible partial seizure Patient was minimally responsive with a GCS of 3, also described as staring fixedly for a prolonged period of time.  However this was all while significantly hypotensive with SBP in the 50s.  Mentation has improved greatly with stable blood pressure.  Will hold off on AED for now. -Monitor for any other signs of seizure activity, if mentation changes with stable BP consider initiating AED and repeating  EEG  Acute on chronic normocytic anemia in the setting of Chronic Disease -2 units PRBCs ordered urgently, will check H&H between units to avoid fluid overload -Oncology managing EPO dosing - Monitor for s/s of bleeding - Daily CBC - Transfuse for Hgb <7  CKD Stage 3b Hypomagnesemia - Strict I/O's: alert provider if UOP < 0.5 mL/kg/hr - Daily BMP, replace electrolytes PRN - Avoid nephrotoxic agents as able, ensure adequate renal perfusion - Mg supplementation ordered  Type 2 Diabetes Mellitus Risk for Steroid Induced Hyperglycemia Hemoglobin A1C: 6.8 (07/31/22) - Monitor CBG Q 4 hours - SSI moderate dosing, continue Semglee 12 units daily - target range while in ICU: 140-180 - follow ICU hyper/hypo-glycemia protocol  Stage IVb metastatic prostate cancer Extensive bony metastasis.  Recent bone marrow aspiration demonstrates presence of ~20% prostate mets.  Per recent oncology note patient progressed on Xtandi, and was not able to tolerate docetaxel chemotherapy.  He is pending IR evaluation for possible Pluvicto treatments, currently borderline candidate and will need to follow-up outpatient.  Other options limited -Continue Decadron daily -Continue Megace 80 mg twice daily -Palliative care and oncology are following, appreciate input  Hypercalcemia -improved to normal range and overnight labs Status post pamidronate dose -Oncology following, appreciate input -Avoid any calcium supplementation  Best Practice (right click and "Reselect all SmartList Selections" daily)  Diet/type: Regular consistency (see orders) DVT prophylaxis: LMWH GI prophylaxis: N/A  Lines: Central line and yes and it is still needed Foley:  Yes, and it is still needed Code Status:  full code Last date of multidisciplinary goals of care discussion [11/22/22]  Labs   CBC: Recent Labs  Lab 12-02-22 1006 11/21/22 0530 11/22/22 0109 11/22/22 0450 11/22/22 2310 11/23/22 0357 11/23/22 1728  11/23/22 2335 11/24/22 0515  WBC 7.0 5.5  --  5.4 8.1 10.6*  --   --  5.9  NEUTROABS 5.7  --   --   --   --   --   --   --   --   HGB 9.1* 7.0*   < > 8.3* 5.2* 7.7* 6.3* 7.2* 7.1*  HCT 29.8* 21.7*   < > 25.2* 16.1* 23.0* 19.1* 21.2* 21.5*  MCV 87.9 86.1  --  83.2 86.1 83.9  --   --  85.3  PLT 171 138*  --  132* 137* 155  --   --  102*   < > = values in this interval not displayed.    Basic Metabolic Panel: Recent Labs  Lab 12/02/2022 1006 11/21/22 0613 11/21/22 0621 11/22/22 0450 11/22/22 2310 11/24/22 0515  NA 136  --  138 138 138 143  K 4.7  --  3.9 3.6 3.6 3.6  CL 100  --  102 105 105 110  CO2 25  --  26 28 26 26   GLUCOSE 222*  --  145* 147* 249* 251*  BUN 36*  --  30* 26* 34* 65*  CREATININE 1.67*  --  1.33* 1.27* 1.60* 1.70*  CALCIUM 12.1*  --  11.3* 11.2* 9.9 10.1  MG  --   --   --   --  1.6* 2.0  PHOS  --  3.9  --   --  4.1 2.8   GFR: Estimated Creatinine Clearance: 43.1 mL/min (A) (by C-G formula based on SCr of 1.7 mg/dL (H)). Recent Labs  Lab 2022/12/02 1445 02-Dec-2022 2124 11/21/22 0530 11/22/22 0450 11/22/22 2310 11/23/22 0357 11/24/22 0515  PROCALCITON  --   --   --   --  0.29  --   --   WBC  --   --    < > 5.4 8.1 10.6* 5.9  LATICACIDVEN 2.6* 2.1*  --   --  1.7  --   --    < > = values in this interval not displayed.    Liver Function Tests: Recent Labs  Lab December 02, 2022 1006 11/22/22 0450 11/22/22 2310 11/24/22 0515  AST 25 25 20 20   ALT 23 19 15 16   ALKPHOS 83 61 54 60  BILITOT 0.7 1.0 0.4 0.3  PROT 7.0 5.8* 4.1* 5.0*  ALBUMIN 2.6* 2.2* 1.6* 2.1*  2.0*   No results for input(s): "LIPASE", "AMYLASE" in the last 168 hours. No results for input(s): "AMMONIA" in the last 168 hours.  ABG    Component Value Date/Time   HCO3 26.6 11/22/2022 2312   ACIDBASEDEF 6.6 (H) 09/21/2022 2308   O2SAT 54 11/22/2022 2312     Coagulation Profile: Recent Labs  Lab 12/02/22 1006 11/21/22 0621 11/22/22 2310  INR 1.3* 1.3* 1.6*    Cardiac  Enzymes: Recent Labs  Lab 12/02/2022 1006  CKMB 3.0    HbA1C: HbA1c POC (<> result, manual entry)  Date/Time Value Ref Range Status  02/23/2021 09:11 AM 5.6 4.0 - 5.6 % Final   Hgb A1c MFr Bld  Date/Time Value Ref Range Status  07/31/2022 04:23 PM 6.8 (H) 4.8 - 5.6 %  Final    Comment:    (NOTE)         Prediabetes: 5.7 - 6.4         Diabetes: >6.4         Glycemic control for adults with diabetes: <7.0   03/06/2018 01:47 PM 7.3 (H) 4.8 - 5.6 % Final    Comment:    (NOTE) Pre diabetes:          5.7%-6.4% Diabetes:              >6.4% Glycemic control for   <7.0% adults with diabetes     CBG: Recent Labs  Lab 11/23/22 1736 11/23/22 1929 11/23/22 2320 11/24/22 0333 11/24/22 0728  GLUCAP 267* 250* 288* 242* 198*    Review of Systems:   UTA- patient responsive but drowsy and extremely HOH without hearing aides present, unable to participate in interview at this time. Only current complaint is feeling cold.  Past Medical History:  He,  has a past medical history of BPH (benign prostatic hyperplasia), Cancer (HCC), Chronic kidney disease, Diabetes mellitus without complication (HCC), HOH (hard of hearing), Hypertension, Inguinal hernia, Pain, and Prostate cancer (HCC).   Surgical History:   Past Surgical History:  Procedure Laterality Date   BACK SURGERY     Lumbar   CATARACT EXTRACTION W/PHACO Right 07/10/2017   Procedure: CATARACT EXTRACTION PHACO AND INTRAOCULAR LENS PLACEMENT (IOC);  Surgeon: Galen Manila, MD;  Location: [facility] ORS;  Service: Ophthalmology;  Laterality: Right;  [place] 00:29.2 AP% 16.5 CDE 4.83 Fluid Pack Lot # [id number]   COLONOSCOPY     EYE SURGERY Bilateral    cataract extraction   HERNIA REPAIR     INGUINAL   IR BONE MARROW BIOPSY & ASPIRATION  11/15/2022   PROSTATE BIOPSY N/A 04/08/2020   Procedure: PROSTATE BIOPSY Addison Bailey;  Surgeon: Orson Ape, MD;  Location: [facility] ORS;  Service: Urology;  Laterality: N/A;   TOTAL KNEE ARTHROPLASTY Left  03/19/2018   Procedure: TOTAL KNEE ARTHROPLASTY;  Surgeon: Juanell Fairly, MD;  Location: [facility] ORS;  Service: Orthopedics;  Laterality: Left;     Social History:   reports that he has never smoked. He has never been exposed to tobacco smoke. He has never used smokeless tobacco. He reports that he does not drink alcohol and does not use drugs.   Family History:  His family history includes Breast cancer in his sister; Diabetes in his father.   Allergies No Known Allergies   Home Medications  Prior to Admission medications   Medication Sig Start Date End Date Taking? Authorizing Provider  aspirin EC 81 MG tablet Take 81 mg by mouth daily.   Yes [provider]  cholecalciferol (VITAMIN D) 1000 units tablet Take 1,000 Units by mouth daily.   Yes [provider]  ciprofloxacin (CIPRO) 500 MG tablet Take 1 tablet (500 mg total) by mouth 2 (two) times daily for 20 doses. 11/16/22 12-16-2022 Yes Enedina Finner, MD  dapagliflozin propanediol (FARXIGA) 10 MG TABS tablet Take 10 mg by mouth daily.   Yes [provider]  Iron-Vitamin C 65-125 MG TABS Take 1 tablet by mouth daily. 08/23/22  Yes Rickard Patience, MD  loratadine (CLARITIN) 10 MG tablet Take 10 mg by mouth daily as needed.   Yes [provider]  losartan (COZAAR) 100 MG tablet Take 100 mg by mouth daily. Hold for BP below 111.   Yes [provider]  lovastatin (MEVACOR) 40 MG tablet TAKE 1 TABLET BY MOUTH  EVERY DAY 04/25/22  Yes Masoud, Renda Rolls, MD  metoprolol tartrate (LOPRESSOR) 50 MG tablet TAKE 1 TABLET BY MOUTH EVERY 12 HOURS Patient taking differently: Take 50 mg by mouth 2 (two) times daily. Hold for BP below 111. 11/25/21  Yes Masoud, Renda Rolls, MD  Misc Natural Products (PROSTATE SUPPORT PO) Take 2 capsules by mouth daily.   Yes [provider]  NOVOLOG FLEXPEN 100 UNIT/ML FlexPen Inject into the skin. Per sliding scale 01/03/18  Yes [provider]  Omega-3 Fatty Acids (FISH OIL PO)  Take 1 capsule by mouth daily.    Yes [provider]  oxyCODONE (ROXICODONE) 5 MG immediate release tablet Take 1 tablet (5 mg total) by mouth every 8 (eight) hours as needed. 11/17/22 11/17/23 Yes Enedina Finner, MD  senna (SENOKOT) 8.6 MG TABS tablet Take 1 tablet (8.6 mg total) by mouth daily. 11/17/22  Yes Enedina Finner, MD  TRESIBA FLEXTOUCH 200 UNIT/ML FlexTouch Pen Inject 26 Units into the skin daily before breakfast. 11/16/22  Yes Enedina Finner, MD  TRULICITY 1.5 MG/0.5ML SOPN Inject 1.5 mg into the skin once a week. 08/10/22  Yes [provider]  vitamin B-12 (CYANOCOBALAMIN) 500 MCG tablet Take 500 mcg by mouth daily.   Yes [provider]  amLODipine (NORVASC) 5 MG tablet Take 5 mg by mouth daily. Patient not taking: Reported on 11/24/2022 11/16/22   [provider]     Critical care provider statement:   Total critical care time: 33 minutes   Performed by: Karna Christmas MD   Critical care time was exclusive of separately billable procedures and treating other patients.   Critical care was necessary to treat or prevent imminent or life-threatening deterioration.   Critical care was time spent personally by me on the following activities: development of treatment plan with patient and/or surrogate as well as nursing, discussions with consultants, evaluation of patient's response to treatment, examination of patient, obtaining history from patient or surrogate, ordering and performing treatments and interventions, ordering and review of laboratory studies, ordering and review of radiographic studies, pulse oximetry and re-evaluation of patient's condition.    Vida Rigger, M.D.  Pulmonary & Critical Care Medicine

## 2022-11-24 NOTE — Progress Notes (Addendum)
Daily Progress Note   Patient Name: Keith Howe       Date: 11/24/2022 DOB: 30-Nov-1940  Age: 82 y.o. MRN#: [id number] Attending Physician: Arnetha Courser, MD Primary Care Physician: Corky Downs, MD Admit Date: 12/06/2022  Reason for Consultation/Follow-up: Establishing goals of care  Subjective: Notes and labs reviewed. In with CCM together to see patient. Wife and sister are at bedside and patient is resting with eyes closed, unresponsive to our presence. Even and unlabored respirations.  They state son was present at bedside throughout the night, and patient asked son's permission to "go", and son granted him permission.   They are ready to shift to comfort care and understand patient may die at any time.   ADDENDUM: Returned to bedside and patient is resting comfortably with eyes closed. Family at bedside. Stepped out to speak with wife and patient's sister. They state [facility] has spoken with them about hospice facility placement. They are working towards this plan.    I completed a MOST form today with wife, and patient's sister was present, and the signed original was placed in the chart. The form was scanned and sent to medical records for it to be uploaded under ACP tab in Epic. A photocopy was also placed in the chart to be scanned into EMR. The patient outlined their wishes for the following treatment decisions:  Cardiopulmonary Resuscitation: Do Not Attempt Resuscitation (DNR/No CPR)  Medical Interventions: Comfort Measures: Keep clean, warm, and dry. Use medication by any route, positioning, wound care, and other measures to relieve pain and suffering. Use oxygen, suction and manual treatment of airway obstruction as needed for comfort. Do not transfer to the hospital unless  comfort needs cannot be met in current location.  Antibiotics: No antibiotics (use other measures to relieve symptoms)  IV Fluids: No IV fluids (provide other measures to ensure comfort)  Feeding Tube: No feeding tube     Length of Stay: 4  Current Medications: Scheduled Meds:   sodium chloride   Intravenous Once   aspirin  81 mg Oral Daily   Chlorhexidine Gluconate Cloth  6 each Topical Daily   enoxaparin (LOVENOX) injection  40 mg Subcutaneous Q24H   hydrocortisone sod succinate (SOLU-CORTEF) inj  50 mg Intravenous Q6H   insulin aspart  0-20 Units Subcutaneous Q4H   insulin glargine-yfgn  12 Units Subcutaneous QHS   megestrol  80 mg Oral BID   pantoprazole (PROTONIX) IV  40 mg Intravenous Q12H   pravastatin  40 mg Oral q1800   senna  1 tablet Oral Daily   sodium chloride flush  3 mL Intravenous Q12H    Continuous Infusions:  sodium chloride Stopped (11/22/22 0201)   sodium chloride Stopped (11/23/22 0019)   cefTRIAXone (ROCEPHIN)  IV 200 mL/hr at 11/24/22 1000   phenylephrine (NEO-SYNEPHRINE) Adult infusion     sodium chloride Stopped (11/23/22 0025)    PRN Meds: sodium chloride, acetaminophen **OR** acetaminophen, fentaNYL (SUBLIMAZE) injection, HYDROcodone-acetaminophen, HYDROmorphone (DILAUDID) injection, ondansetron **OR** ondansetron (ZOFRAN) IV  Physical Exam Constitutional:      Comments: Eyes closed.   Pulmonary:     Comments: Even and unlabored.             Vital Signs: BP (!) 88/50   Pulse (!) 112   Temp 97.6 F (36.4 C) (Axillary)   Resp 11   Ht 6\' 2"  (1.88 m)   Wt 100.3 kg   SpO2 100%   BMI 28.39 kg/m  SpO2: SpO2: 100 % O2 Device: O2 Device: Nasal Cannula O2 Flow Rate: O2 Flow Rate (L/min): 2 L/min  Intake/output summary:  Intake/Output Summary (Last 24 hours) at 11/24/2022 1345 Last data filed at 11/24/2022 1000 Gross Keith 24 hour  Intake 631.78 ml  Output 1210 ml  Net -578.22 ml   LBM: Last BM Date : 11/24/22 Baseline Weight: Weight:  100.3 kg Most recent weight: Weight: 100.3 kg   Patient Active Problem List   Diagnosis Date Noted   Shock circulatory (HCC) 11/22/2022   AKI (acute kidney injury) (HCC) 11/21/2022   Sepsis (HCC) 2022/12/15   Hypercalcemia Dec 15, 2022   Altered mental status 12-15-22   Benign prostatic hyperplasia 11/13/2022   Spinal stenosis 11/13/2022   Anemia due to bone marrow failure (HCC) 11/11/2022   Acute exacerbation of chronic low back pain 11/11/2022   Antineoplastic chemotherapy induced anemia 11/11/2022   Compression fracture of L2 lumbar vertebra, closed, initial encounter (HCC) 11/11/2022   Indwelling Foley catheter present 11/11/2022   Hypotension 09/27/2022   Anemia in chronic kidney disease 09/21/2022   Chronic kidney disease, stage 3b (HCC) 09/21/2022   Leukocytosis 09/21/2022   Bilateral hydronephrosis 09/20/2022   Chemotherapy-induced neuropathy (HCC) 09/13/2022   Weight loss 08/09/2022   Syncope 07/31/2022   SIRS (systemic inflammatory response syndrome) (HCC) 07/31/2022   Obesity (BMI 30-39.9) 07/31/2022   Uncontrolled type 2 diabetes mellitus with hyperglycemia, with long-term current use of insulin (HCC) 07/31/2022   Myocardial injury 07/31/2022   Bilateral leg edema 07/31/2022   Normocytic anemia 07/31/2022   Chemotherapy induced neutropenia (HCC) 07/05/2022   Encounter for antineoplastic chemotherapy 05/26/2022   Prostate cancer metastatic to bone (HCC) 07/13/2021   Goals of care, counseling/discussion 07/13/2021   Injury of quadriceps muscle 08/26/2020   Chronic kidney disease, stage 3a (HCC) 05/27/2020   Annual physical exam 02/18/2020   Need for influenza vaccination 02/18/2020   Essential hypertension 12/15/2019   Class 1 obesity with serious comorbidity and body mass index (BMI) of 31.0 to 31.9 in adult 10/13/2019   Type 2 diabetes mellitus without complication, with long-term current use of insulin (HCC) 10/13/2019   S/P TKR (total knee replacement) using  cement, left 03/19/2018    Palliative Care Assessment & Plan   Recommendations/Plan: Family has decided on comfort care. Team is working on hospice facility placement.  Comfort care-- currently receiving regular doses  of PRN IV Dilaudid and Fentanyl. Norco discontinued as Keith nursing, patient is unable to take pills. PRN IV Ativan ordered for Anxiety.    Code Status:    Code Status Orders  (From admission, onward)           Start     Ordered   2022/12/07 1824  Full code  Continuous       Question:  By:  Answer:  Consent: discussion documented in EHR   12-07-22 1825           Code Status History     Date Active Date Inactive Code Status Order ID Comments User Context   11/11/2022 0034 11/16/2022 2107 Full Code [id number]  Andris Baumann, MD ED   09/21/2022 1113 09/26/2022 1907 Full Code [id number]  Lorretta Harp, MD ED   07/31/2022 1524 08/02/2022 1832 Full Code [id number]  Lorretta Harp, MD ED   03/19/2018 1246 03/24/2018 1814 Full Code [id number]  Juanell Fairly, MD Inpatient       Prognosis:  poor    Care plan was discussed with CCM  Thank you for allowing the Palliative Medicine Team to assist in the care of this patient.   Morton Stall, NP  Please contact Palliative Medicine Team phone at [phone] for questions and concerns.

## 2022-11-24 NOTE — Progress Notes (Signed)
Chap visited Pt and family at the recommendation of the Care Management team. Pt wife and Pt Sister at the bed side. They both expressed knowledge that their loved one is on a transition state and are coming to terms with it. Mirna Mires gave compassionate Presence and a prayer as requested. [place] service are available  as need arise.  11/24/22 1400  Spiritual Encounters  Type of Visit Initial  Care provided to: Pt and family  Conversation partners present during encounter Social worker/Care management/TOC  Referral source Social worker/Care management/TOC  Reason for visit End-of-life  OnCall Visit Yes  Spiritual Framework  Presenting Themes Significant life change  Community/Connection Friend(s)  Patient Stress Factors Loss  Interventions  Spiritual Care Interventions Made Reflective listening;Compassionate presence;Bereavement/grief support;Prayer  Intervention Outcomes  Outcomes Awareness of support;Reduced isolation;Connection to spiritual care  Spiritual Care Plan  Spiritual Care Issues Still Outstanding No further spiritual care needs at this time (see row info)

## 2022-11-24 NOTE — Progress Notes (Signed)
Progress Note   Patient: Keith Howe WJX:[id number] DOB: 1941-01-23 DOA: 11/11/2022     4 DOS: the patient was seen and examined on 11/24/2022   Brief hospital course: Taken from H&P.  Keith Howe is a 82 y.o. 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  Per wife patient with worsening fatigue, generalized malaise and poor p.o. intake.  New left flank pain, previously he had right flank pain during most recent admission. Patient was noted to have decreased responsiveness and blood pressure of 63/45 noted at oncology center and was sent to ED.  ED course: On arrival to the ED, patient's blood pressure had improved to 109/63 but did decrease as low as 95/50.  He was noted to be tachycardic with heart rate of 116.  He was afebrile at 98.1.  Initial workup demonstrated hemoglobin of 9.1, creatinine of 1.67, BUN 36 with GFR 41, calcium of 12.1, lactic acid of 2.6.  Troponin elevated at 24 with flat trend to 22.  INR elevated at 1.3.  Urinalysis demonstrated moderate leukocytes, rare bacteria with hematuria.  UDS negative.  CT head, CTA head/neck and MRI of the brain were obtained with no evidence of acute infarct or LVO.  Admitted for concern of sepsis secondary to UTI and started on cefepime.  7/16; vitals stable, improving renal function, hemoglobin decreased to 7 but all cell lines decreased, likely some dilutional effect.  Recent bone marrow biopsy of at 20% malignant cells and pancytopenia-ordered 1 unit of PRBC after discussing with patient and family  Palliative care was also consulted due to grave prognosis with stage IV prostatic cancer with extensive bony mets. Preliminary blood cultures negative in 12 hours, urine cultures pending.  7/17: Urine and blood cultures negative.   EEG with generalized slowing and no epileptiform discharge, it was ordered in ED for concern of altered mental status.  Patient was on antibiotics for total of 14 days due to recent pyelonephritis, cefepime switched with ceftriaxone and we will complete that course, day 11 today.  Clinically stable, restarting home antihypertensives. Palliative care is on board and patient can be discharged home likely tomorrow once hospital bed is delivered.  Prognosis remained poor at this time. Patient was given a dose of pamidronate by oncology with some improvement in hypercalcemia.  7/18: Overnight patient became hypotensive and unresponsive, requiring transferring to ICU on pressors.  Patient was given his home dose of antihypertensives yesterday.  Repeat labs with some leukocytosis and decrease of hemoglobin to 5.2 from 8 point 3 in the morning, no obvious bleeding to explain this 3 digit decrease within a day.  2 unit of PRBC was ordered. Patient remained lethargic but responsive and answering simple questions appropriately.  He does not want to talk a whole lot which he was doing during current hospitalization.  Hemoglobin this morning 7.7 after getting 1 unit of PRBC, we will hold off to home antihypertensives at this time and his outpatient providers can restart if appropriate. Normotensive during morning rounds and Levophed has been stopped. Echocardiogram ordered-pending.  7/19:Overnight labile BP, again placed on pressors. Echo with hyperdynamic EF and severe asymmetrical LVH.Rapidly decreasing Hgb despite multiple transfusions, likely cancer taking over bone marrow.Discussed with oncology and they strongly recommend Hospice, unfortunately nothing much to offer at this time. Another family meeting with  PCCM and palliative care and family decided to proceed with DNR and comfort care.He is currently unresponsive, family is interested in Hospice facility, not sure whether he will make it, currently anticipating  hospital death.   Assessment and Plan: * Sepsis (HCC) Patient is presenting with altered mental status, hypotension, tachycardia and tachypnea and elevated lactic acid.  Urinalysis is concerning with either reinfection versus refractory infection given he was just admitted for sepsis secondary to pyelonephritis. Left CVA tenderness noted today. Culture data on most recent admission demonstrated Serratia, which is known for creating biofilm. Sepsis ruled out as cultures remain negative. -Cefepime switched with ceftriaxone to complete a prior 14-day course. - Blood and urine cultures remain negative - Foley catheter has been exchanged in the ED  Patient is being transitioned to comfort care now.  Shock circulatory (HCC) Patient again became unresponsive and hypotensive overnight requiring transfer to ICU on pressors.  Similar episode which was the reason for the current hospitalization at cancer center.  Patient was restarted on home antihypertensives yesterday morning once his blood pressure started trending up and was more than 150 systolic. Able to wean off from pressors CTA was obtained and it was negative for PE but did show bilateral moderate pleural effusions.  So echocardiogram was ordered.  Also consistent with his history of extensive bony mets. -Continue holding home antihypertensives, if we need to restart we will only start at losartan 25 mg daily instead of his home dose of 100 mg. -Patient might need diuresis-awaiting blood pressure to remain stable. -Follow-up on echocardiogram  Hypercalcemia New significant hypercalcemia of 12.1, that corrects to 13.2 on admission, potentially secondary to bony metastasis.  Improved to 12.6 s/p IV fluid and pamidronate Parathyroid hormone appropriately low. -Avoid any calcium supplement -Continue to monitor  Prostate cancer metastatic to bone [facility]) History of prostate cancer, stage IVb with extensive bony metastasis.  Recent bone marrow  aspiration demonstrates presence of ~ 20% prostate mets. -Apparently has stopped chemo -Palliative care and oncology consult-patient has limited choices.  Apparently oncology referred him to IR for a different treatment, he might not be a candidate but need evaluation by them, apparently that can only be done as outpatient.  Patient continue to deteriorates, now DNR and comfort care.  Altered mental status Transient altered mental status when patient was hypotensive, with patient having no recollection of the event.  CVA has been ruled out with CT head, CTA of the head/neck and MRI.  EEG with generalized slowing, no epileptiform changes  Patient is currently back to baseline.  No further intervention indicated.  7/18: Had another episode of becoming altered and unresponsive in the setting of hypotension.  Has been improved and repeat CT head was negative for any acute abnormality.  7/19.Patient currently unresposive.  Normocytic anemia Multifactorial with advanced malignancy and involvement of bone marrow, being followed up at cancer center.  Recent bone marrow with pancytopenia and 20% malignant cells.  Hemoglobin at 8.3 after getting 1 unit of PRBC on 7/16.  Patient also received a dose of EPO by oncology on 7/16. Hemoglobin was found to be at 5.2 when rechecked during last night unresponsiveness and hypotension episode, no obvious bleeding.  Improved to 7.7 after getting 1 unit of PRBC Another decreased in Hgb requiring one more unit. Now comfort care only.  Acute encephalopathy Currently unresponsive.  Chronic kidney disease, stage 3b (HCC) Per chart review, patient's creatinine has ranged from 1.3-1.7 over the last 3 years.  Currently 1.67>>1.33, however it was  1.22 approximately 6 days ago. Some worsening of creatinine with hypotensive episode last night, at 1.60 - Continue to monitor renal function   Anemia History of anemia secondary to CKD and bone marrow suppression in the  setting of bone metastasis.  Hemoglobin is stable at this time.  - CBC in the a.m.  Uncontrolled type 2 diabetes mellitus with hyperglycemia, with long-term current use of insulin (HCC) - Hold home antiglycemic agents - SSI, sensitive - Semglee 12 units at bedtime - May need to consider discontinuing [place] +/- Trulicity in the long-term given patient has poor p.o. intake, recurrent hypotension, etc  Essential hypertension Patient had another episode of profound hypotension, requiring pressors. Very labile BP. Patient is now comfort care.   Subjective: Patient appears comfortable and pretty much unresponsive. Wife and sister at bedside, inquiring about hospice facility.  Physical Exam: Vitals:   11/24/22 0800 11/24/22 0900 11/24/22 0929 11/24/22 1000  BP: 123/67 (!) 89/36 (!) 94/37 (!) 88/50  Pulse: (!) 118 (!) 112    Resp: 20 20 12 11   Temp: 97.6 F (36.4 C)     TempSrc: Axillary     SpO2: 95% 100%    Weight:      Height:       General.  Unresponsive gentleman, in no acute distress. Pulmonary.  Lungs clear bilaterally, normal respiratory effort. CV.  Regular rate and rhythm, no JVD, rub or murmur. Abdomen.  Soft, nontender, nondistended, BS positive. CNS.  Unresponsive Extremities.  No edema, no cyanosis, pulses intact and symmetrical.  Data Reviewed: Prior data reviewed  Family Communication: Discussed with wife and sister at bedside  Disposition: Status is: Inpatient Remains inpatient appropriate because: Severity of illness  Planned Discharge Destination: Home  DVT prophylaxis.  Lovenox Time spent: 50 minutes  This record has been created using Conservation officer, historic buildings. Errors have been sought and corrected,but may not always be located. Such creation errors do not reflect on the standard of care.   Author: Arnetha Courser, MD 11/24/2022 2:16 PM  For on call review [email or web address].

## 2022-11-24 NOTE — Progress Notes (Signed)
Report to RN receiving room 121

## 2022-11-24 NOTE — Progress Notes (Signed)
   11/24/22 1500  Spiritual Encounters  Type of Visit Follow up  Care provided to: Pt and family  Referral source Chaplain assessment  Reason for visit End-of-life  OnCall Visit No  Spiritual Framework  Presenting Themes Impactful experiences and emotions;Courage hope and growth  Community/Connection Family;Friend(s);Significant other  Patient Stress Factors Loss  Family Stress Factors None identified  Interventions  Spiritual Care Interventions Made Compassionate presence;Reflective listening;Encouragement;Bereavement/grief support;Meaning making  Intervention Outcomes  Outcomes Awareness around self/spiritual resourses;Awareness of health;Awareness of support  Spiritual Care Plan  Spiritual Care Issues Still Outstanding No further spiritual care needs at this time (see row info)   Chaplain made follow up visit with patient and family. Family was notified today that the patient will transition soon. Patient's sister said that she has made her peace with losing her brother and that she is just glad he will not suffer anymore. Patients wife and son has had a hard time accepting the diagnosis and said they were hoping for a miracle. Chaplain sat with wife and sister and physician to go over DNR information and details about hospice care. Family thanked the chaplain for providing care for them since the beginning of the patients stay. Chaplain will refer them to chaplain on call for follow up care.

## 2022-11-25 DIAGNOSIS — N179 Acute kidney failure, unspecified: Secondary | ICD-10-CM | POA: Diagnosis not present

## 2022-11-25 DIAGNOSIS — N189 Chronic kidney disease, unspecified: Secondary | ICD-10-CM | POA: Diagnosis not present

## 2022-11-25 DIAGNOSIS — Z515 Encounter for palliative care: Secondary | ICD-10-CM

## 2022-11-25 DIAGNOSIS — A419 Sepsis, unspecified organism: Secondary | ICD-10-CM | POA: Diagnosis not present

## 2022-11-25 DIAGNOSIS — G9340 Encephalopathy, unspecified: Secondary | ICD-10-CM | POA: Diagnosis not present

## 2022-11-25 LAB — CULTURE, BLOOD (ROUTINE X 2)
Culture: NO GROWTH
Special Requests: ADEQUATE

## 2022-11-25 LAB — ACTH: C206 ACTH: 136 pg/mL — ABNORMAL HIGH (ref 7.2–63.3)

## 2022-11-25 MED ORDER — ONDANSETRON 4 MG PO TBDP: 4.0000 mg | ORAL_TABLET | Freq: Four times a day (QID) | ORAL | Status: DC | PRN

## 2022-11-25 MED ORDER — GLYCOPYRROLATE 0.2 MG/ML IJ SOLN
0.2000 mg | INTRAMUSCULAR | Status: DC | PRN
  Administered 2022-11-25 – 2022-11-26 (×2): 0.2 mg via INTRAVENOUS
  Filled 2022-11-25: qty 1

## 2022-11-25 MED ORDER — POLYVINYL ALCOHOL 1.4 % OP SOLN
1.0000 [drp] | Freq: Four times a day (QID) | OPHTHALMIC | Status: DC | PRN
  Administered 2022-11-25: 1 [drp] via OPHTHALMIC
  Filled 2022-11-25: qty 15

## 2022-11-25 MED ORDER — ACETAMINOPHEN 650 MG RE SUPP: 650.0000 mg | Freq: Four times a day (QID) | RECTAL | Status: DC | PRN

## 2022-11-25 MED ORDER — ACETAMINOPHEN 325 MG PO TABS: 650.0000 mg | ORAL_TABLET | Freq: Four times a day (QID) | ORAL | Status: DC | PRN

## 2022-11-25 MED ORDER — HALOPERIDOL LACTATE 2 MG/ML PO CONC: 0.5000 mg | ORAL | Status: DC | PRN

## 2022-11-25 MED ORDER — HALOPERIDOL 0.5 MG PO TABS: 0.5000 mg | ORAL_TABLET | ORAL | Status: DC | PRN

## 2022-11-25 MED ORDER — BIOTENE DRY MOUTH MT LIQD: 15.0000 mL | OROMUCOSAL | Status: DC | PRN

## 2022-11-25 MED ORDER — GLYCOPYRROLATE 0.2 MG/ML IJ SOLN
0.2000 mg | INTRAMUSCULAR | Status: DC | PRN
  Filled 2022-11-25: qty 1

## 2022-11-25 MED ORDER — GLYCOPYRROLATE 1 MG PO TABS: 1.0000 mg | ORAL_TABLET | ORAL | Status: DC | PRN

## 2022-11-25 MED ORDER — ONDANSETRON HCL 4 MG/2ML IJ SOLN: 4.0000 mg | Freq: Four times a day (QID) | INTRAMUSCULAR | Status: DC | PRN

## 2022-11-25 MED ORDER — HALOPERIDOL LACTATE 5 MG/ML IJ SOLN
0.5000 mg | INTRAMUSCULAR | Status: DC | PRN
  Administered 2022-11-25: 0.5 mg via INTRAVENOUS
  Filled 2022-11-25: qty 1

## 2022-11-25 NOTE — Progress Notes (Signed)
Patient restless, removing gown, etc. When asked if he was in pain, pt nodded head. Medicated for pain per order.

## 2022-11-25 NOTE — Progress Notes (Signed)
Progress Note   Patient: Keith Howe DOB: 1940/09/06 DOA: 11/21/2022     5 DOS: the patient was seen and examined on 11/25/2022   Brief hospital course: Taken from H&P.  Keith Howe is a 82 y.o. 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  Per wife patient with worsening fatigue, generalized malaise and poor p.o. intake.  New left flank pain, previously he had right flank pain during most recent admission. Patient was noted to have decreased responsiveness and blood pressure of 63/45 noted at oncology center and was sent to ED.  ED course: On arrival to the ED, patient's blood pressure had improved to 109/63 but did decrease as low as 95/50.  He was noted to be tachycardic with heart rate of 116.  He was afebrile at 98.1.  Initial workup demonstrated hemoglobin of 9.1, creatinine of 1.67, BUN 36 with GFR 41, calcium of 12.1, lactic acid of 2.6.  Troponin elevated at 24 with flat trend to 22.  INR elevated at 1.3.  Urinalysis demonstrated moderate leukocytes, rare bacteria with hematuria.  UDS negative.  CT head, CTA head/neck and MRI of the brain were obtained with no evidence of acute infarct or LVO.  Admitted for concern of sepsis secondary to UTI and started on cefepime.  7/16; vitals stable, improving renal function, hemoglobin decreased to 7 but all cell lines decreased, likely some dilutional effect.  Recent bone marrow biopsy of at 20% malignant cells and pancytopenia-ordered 1 unit of PRBC after discussing with patient and family  Palliative care was also consulted due to grave prognosis with stage IV prostatic cancer with extensive bony mets. Preliminary blood cultures negative in 12 hours, urine cultures pending.  7/17: Urine and blood cultures negative.   EEG with generalized slowing and no epileptiform discharge, it was ordered in ED for concern of altered mental status.  Patient was on antibiotics for total of 14 days due to recent pyelonephritis, cefepime switched with ceftriaxone and we will complete that course, day 11 today.  Clinically stable, restarting home antihypertensives. Palliative care is on board and patient can be discharged home likely tomorrow once hospital bed is delivered.  Prognosis remained poor at this time. Patient was given a dose of pamidronate by oncology with some improvement in hypercalcemia.  7/18: Overnight patient became hypotensive and unresponsive, requiring transferring to ICU on pressors.  Patient was given his home dose of antihypertensives yesterday.  Repeat labs with some leukocytosis and decrease of hemoglobin to 5.2 from 8 point 3 in the morning, no obvious bleeding to explain this 3 digit decrease within a day.  2 unit of PRBC was ordered. Patient remained lethargic but responsive and answering simple questions appropriately.  He does not want to talk a whole lot which he was doing during current hospitalization.  Hemoglobin this morning 7.7 after getting 1 unit of PRBC, we will hold off to home antihypertensives at this time and his outpatient providers can restart if appropriate. Normotensive during morning rounds and Levophed has been stopped. Echocardiogram ordered-pending.  7/19:Overnight labile BP, again placed on pressors. Echo with hyperdynamic EF and severe asymmetrical LVH.Rapidly decreasing Hgb despite multiple transfusions, likely cancer taking over bone marrow.Discussed with oncology and they strongly recommend Hospice, unfortunately nothing much to offer at this time. Another family meeting with  PCCM and palliative care and family decided to proceed with DNR and comfort care . He is currently unresponsive, family is interested in Hospice facility, not sure whether he will make it, currently  anticipating hospital death.  26-Nov-2022 : Patient unresponsive comfortable.  On comfort care measures only.  Medications including antibiotics were discontinued.  Only comfort care medications orders in place.  Assessment and Plan: * Sepsis (HCC) Patient is presenting with altered mental status, hypotension, tachycardia and tachypnea and elevated lactic acid.  Urinalysis is concerning with either reinfection versus refractory infection given he was just admitted for sepsis secondary to pyelonephritis. Left CVA tenderness noted today. Culture data on most recent admission demonstrated Serratia, which is known for creating biofilm. Sepsis ruled out as cultures remain negative. -Cefepime switched with ceftriaxone to complete a prior 14-day course. - Blood and urine cultures remain negative - Foley catheter has been exchanged in the ED  Patient is being transitioned to comfort care now.  Shock circulatory (HCC) Patient again became unresponsive and hypotensive overnight requiring transfer to ICU on pressors.  Similar episode which was the reason for the current hospitalization at cancer center.  Patient was restarted on home antihypertensives yesterday morning once his blood pressure started trending up and was more than 150 systolic. Able to wean off from pressors CTA was obtained and it was negative for PE but did show bilateral moderate pleural effusions.  So echocardiogram was ordered.  Also consistent with his history of extensive bony mets. -Continue holding home antihypertensives, if we need to restart we will only start at losartan 25 mg daily instead of his home dose of 100 mg. -Patient might need diuresis-awaiting blood pressure to remain stable. -Follow-up on echocardiogram  Hypercalcemia New significant hypercalcemia of 12.1, that corrects to 13.2 on admission, potentially secondary to bony metastasis.  Improved to 12.6 s/p IV fluid and pamidronate Parathyroid hormone appropriately  low. -Avoid any calcium supplement -Continue to monitor  Prostate cancer metastatic to bone [facility]) History of prostate cancer, stage IVb with extensive bony metastasis.  Recent bone marrow aspiration demonstrates presence of ~ 20% prostate mets. -Apparently has stopped chemo -Palliative care and oncology consult-patient has limited choices.  Apparently oncology referred him to IR for a different treatment, he might not be a candidate but need evaluation by them, apparently that can only be done as outpatient.  Patient continue to deteriorates, now DNR and comfort care.  Altered mental status Transient altered mental status when patient was hypotensive, with patient having no recollection of the event.  CVA has been ruled out with CT head, CTA of the head/neck and MRI.  EEG with generalized slowing, no epileptiform changes  Patient is currently back to baseline.  No further intervention indicated.  7/18: Had another episode of becoming altered and unresponsive in the setting of hypotension.  Has been improved and repeat CT head was negative for any acute abnormality.  7/19.Patient currently unresposive.  Normocytic anemia Multifactorial with advanced malignancy and involvement of bone marrow, being followed up at cancer center.  Recent bone marrow with pancytopenia and 20% malignant cells.  Hemoglobin at 8.3 after getting 1 unit of PRBC on 7/16.  Patient also received a dose of EPO by oncology on 7/16. Hemoglobin was found to be at 5.2 when rechecked during last night unresponsiveness and hypotension episode, no obvious bleeding.  Improved to 7.7 after getting 1 unit of PRBC Another decreased in Hgb requiring one more unit. Now comfort care only.  Acute encephalopathy Currently unresponsive.  Chronic kidney disease, stage 3b (HCC) Per chart review, patient's creatinine has ranged from 1.3-1.7 over the last 3 years.  Currently 1.67>>1.33, however it was 1.22 approximately 6 days ago. Some  worsening of creatinine with hypotensive episode last night, at 1.60 - Continue to monitor renal function   Anemia History of anemia secondary to CKD and bone marrow suppression in the setting of bone metastasis.  Hemoglobin is stable at this time.  - CBC in the a.m.  Uncontrolled type 2 diabetes mellitus with hyperglycemia, with long-term current use of insulin (HCC) - Hold home antiglycemic agents - SSI, sensitive - Semglee 12 units at bedtime - May need to consider discontinuing [place] +/- Trulicity in the long-term given patient has poor p.o. intake, recurrent hypotension, etc  Essential hypertension Patient had another episode of profound hypotension, requiring pressors. Very labile BP. Patient is now comfort care.   Subjective: Patient appears comfortable and pretty much unresponsive.  Wife send and daughter-in-law updated about the status.  Physical Exam: Vitals:   11/24/22 1500 11/24/22 1600 11/24/22 1700 11/25/22 0805  BP: (!) 96/46 (!) 93/50 (!) 115/53 121/73  Pulse:   (!) 104 (!) 116  Resp: 11 19 18 14   Temp:    98 F (36.7 C)  TempSrc:      SpO2:   (!) 84% 97%  Weight:      Height:       General.  Unresponsive gentleman, in no acute distress. Pulmonary.  Lungs clear bilaterally, normal respiratory effort. CV.  Regular rate and rhythm, no JVD, rub or murmur. Abdomen.  Soft, nontender, nondistended, BS positive. CNS.  Unresponsive Extremities.  No edema, no cyanosis, pulses intact and symmetrical.  Data Reviewed: Prior data reviewed  Family Communication: Discussed with wife and sister at bedside  Disposition: Status is: Inpatient Remains inpatient appropriate because: Severity of illness  Planned Discharge Destination: Home  DVT prophylaxis.  Lovenox Time spent: 50 minutes  This record has been created using Conservation officer, historic buildings. Errors have been sought and corrected,but may not always be located. Such creation errors do not reflect on  the standard of care.   Author: Kirstie Peri, MD 11/25/2022 12:57 PM  For on call review [email or web address].

## 2022-11-25 NOTE — Progress Notes (Signed)
Patient resting peacefully, with no signs of distress noted. Family at bedside.

## 2022-11-25 NOTE — Progress Notes (Signed)
Daily Progress Note   Patient Name: Keith Howe       Date: 11/25/2022 DOB: 1941-02-17  Age: 82 y.o. MRN#: [id number] Attending Physician: Kirstie Peri, MD Primary Care Physician: Corky Downs, MD Admit Date: 11/07/2022  Reason for Consultation/Follow-up: Establishing goals of care  HPI/Brief Hospital Review: Taken From H&P Keith Howe is a 82 y.o. male with medical history significant of stage IVb prostate cancer with extensive bony metastasis, chronic anemia on IV iron infusions, T2DM, CKD stage III, BPH, hypertension, who presents to the ED due to altered mental status with hypotension.  Per wife patient with worsening fatigue, generalized malaise and poor p.o. intake.  New left flank pain, previously he had right flank pain during most recent admission. Patient was noted to have decreased responsiveness and blood pressure of 63/45 noted at oncology center and was sent to ED.  Palliative Medicine consulted for assisting with goals of care conversations.  Subjective: Extensive chart review has been completed prior to meeting patient including labs, vital signs, imaging, progress notes, orders, and available advanced directive documents from current and previous encounters.    Request from nursing staff to update order to reflect comfort care.  Review of records and MOST form signed in hard chart Mr. Debes was transitioned to full comfort measures 7/19.  Visited with MR. Hobby at his bedside. Resting comfortably with eyes closed, does not respond to calling of his name and does not acknowledge my presence in room. Wife-[name] and other family at bedside during time of visit. Confirmed with family full transition to comfort measures, discontinuing all medications including IV  antibiotics not focused on comfort for which all family present agrees.   Family continues to think about transition to Hospice Home under routine care. Will have Hospice liaison do assessment today based on unresponsiveness, lack of PO intake and ongoing need of IV hydromorphone for pain control. Orders placed to reflect transition to full comfort care.  Answered and addressed all questions and concerns. Encouraged family to reach out to PMT with any needs or questions as they arise.  Care plan was discussed with nursing staff.  Thank you for allowing the Palliative Medicine Team to assist in the care of this patient.  Total time:  35 minutes  Time spent includes: Detailed review of medical records (labs, imaging,  vital signs), medically appropriate exam (mental status, respiratory, cardiac, skin), discussed with treatment team, counseling and educating patient, family and staff, documenting clinical information, medication management and coordination of care.  Leeanne Deed, DNP, AGNP-C Palliative Medicine   Please contact Palliative Medicine Team phone at [phone] for questions and concerns.

## 2022-11-25 NOTE — Progress Notes (Signed)
[facility] [address] [employer], [employer]  Hospice hospital liaison note  Patient is referred to the hospice home and has been approved.   Met with family in room, patient is resting comfortably at this time with no signs of distress. Family reports he has awoken a couple of times and talked with them and drank a bit of juice this morning. They report that he appears comfortable most of the time unless it is time for his medicine.   Unfortunately hospice home does not have a bed to offer today. Discussed possibility of bringing patient over to [facility] in [place] but at this time they would prefer to wait for a bed at the hospice home.   Liaison will follow up tomorrow or sooner if a bed becomes available.   Thank you for the opportunity to participate in this patient's care.  Please don't hesitate to call for any hospice related questions or concerns.  Thea Gist, BSN RN Hospice hospital liaison [phone]

## 2022-11-26 DIAGNOSIS — G9340 Encephalopathy, unspecified: Secondary | ICD-10-CM | POA: Diagnosis not present

## 2022-11-26 DIAGNOSIS — R579 Shock, unspecified: Secondary | ICD-10-CM | POA: Diagnosis not present

## 2022-11-26 DIAGNOSIS — C61 Malignant neoplasm of prostate: Secondary | ICD-10-CM | POA: Diagnosis not present

## 2022-11-26 DIAGNOSIS — I421 Obstructive hypertrophic cardiomyopathy: Secondary | ICD-10-CM | POA: Diagnosis present

## 2022-11-30 ENCOUNTER — Inpatient Hospital Stay: Payer: Medicare HMO

## 2022-11-30 ENCOUNTER — Other Ambulatory Visit (HOSPITAL_COMMUNITY): Payer: Medicare HMO

## 2022-11-30 ENCOUNTER — Inpatient Hospital Stay: Payer: Medicare HMO | Admitting: Oncology

## 2022-12-01 ENCOUNTER — Encounter (HOSPITAL_COMMUNITY): Payer: Self-pay

## 2022-12-07 NOTE — Progress Notes (Signed)
Pt on Comfort Measures has DNR/NMP orders in place; called into the pt's room by a family member verbalizing to me "I think he is gone"; upon entering the pt's room; the pt without respirations nor apical pulse; pronounced by this Clinical research associate and B. Lang RN; emotional support given to the pt's family; family verbalized they did not need Pastoral Care at this time

## 2022-12-07 NOTE — Plan of Care (Signed)
  Problem: Fluid Volume: Goal: Hemodynamic stability will improve Outcome: Not Applicable   Problem: Clinical Measurements: Goal: Diagnostic test results will improve Outcome: Not Applicable Goal: Signs and symptoms of infection will decrease Outcome: Not Applicable   Problem: Respiratory: Goal: Ability to maintain adequate ventilation will improve Outcome: Not Applicable   Problem: Education: Goal: Ability to describe self-care measures that may prevent or decrease complications (Diabetes Survival Skills Education) will improve Outcome: Not Applicable Goal: Individualized Educational Video(s) Outcome: Not Applicable   Problem: Coping: Goal: Ability to adjust to condition or change in health will improve Outcome: Not Applicable   Problem: Fluid Volume: Goal: Ability to maintain a balanced intake and output will improve Outcome: Not Applicable   Problem: Health Behavior/Discharge Planning: Goal: Ability to identify and utilize available resources and services will improve Outcome: Not Applicable Goal: Ability to manage health-related needs will improve Outcome: Not Applicable   Problem: Metabolic: Goal: Ability to maintain appropriate glucose levels will improve Outcome: Not Applicable   Problem: Nutritional: Goal: Maintenance of adequate nutrition will improve Outcome: Not Applicable Goal: Progress toward achieving an optimal weight will improve Outcome: Not Applicable   Problem: Skin Integrity: Goal: Risk for impaired skin integrity will decrease Outcome: Not Applicable   Problem: Tissue Perfusion: Goal: Adequacy of tissue perfusion will improve Outcome: Not Applicable   Problem: Education: Goal: Knowledge of General Education information will improve Description: Including pain rating scale, medication(s)/side effects and non-pharmacologic comfort measures Outcome: Not Applicable   Problem: Health Behavior/Discharge Planning: Goal: Ability to manage  health-related needs will improve Outcome: Not Applicable   Problem: Clinical Measurements: Goal: Ability to maintain clinical measurements within normal limits will improve Outcome: Not Applicable Goal: Will remain free from infection Outcome: Not Applicable Goal: Diagnostic test results will improve Outcome: Not Applicable Goal: Respiratory complications will improve Outcome: Not Applicable Goal: Cardiovascular complication will be avoided Outcome: Not Applicable   Problem: Activity: Goal: Risk for activity intolerance will decrease Outcome: Not Applicable   Problem: Nutrition: Goal: Adequate nutrition will be maintained Outcome: Not Applicable   Problem: Coping: Goal: Level of anxiety will decrease Outcome: Not Applicable   Problem: Elimination: Goal: Will not experience complications related to bowel motility Outcome: Not Applicable Goal: Will not experience complications related to urinary retention Outcome: Not Applicable   Problem: Pain Managment: Goal: General experience of comfort will improve Outcome: Not Applicable   Problem: Safety: Goal: Ability to remain free from injury will improve Outcome: Not Applicable   Problem: Skin Integrity: Goal: Risk for impaired skin integrity will decrease Outcome: Not Applicable   Problem: Education: Goal: Knowledge of the prescribed therapeutic regimen will improve Outcome: Not Applicable   Problem: Coping: Goal: Ability to identify and develop effective coping behavior will improve Outcome: Not Applicable   Problem: Clinical Measurements: Goal: Quality of life will improve Outcome: Not Applicable   Problem: Respiratory: Goal: Verbalizations of increased ease of respirations will increase Outcome: Not Applicable   Problem: Role Relationship: Goal: Family's ability to cope with current situation will improve Outcome: Not Applicable Goal: Ability to verbalize concerns, feelings, and thoughts to partner or  family member will improve Outcome: Not Applicable   Problem: Pain Management: Goal: Satisfaction with pain management regimen will improve Outcome: Not Applicable

## 2022-12-07 NOTE — Plan of Care (Signed)
  Problem: Fluid Volume: Goal: Hemodynamic stability will improve Outcome: Progressing   Problem: Respiratory: Goal: Ability to maintain adequate ventilation will improve Outcome: Progressing   Problem: Education: Goal: Ability to describe self-care measures that may prevent or decrease complications (Diabetes Survival Skills Education) will improve Outcome: Progressing   Problem: Coping: Goal: Ability to adjust to condition or change in health will improve Outcome: Progressing   Problem: Nutritional: Goal: Maintenance of adequate nutrition will improve Outcome: Progressing   Problem: Skin Integrity: Goal: Risk for impaired skin integrity will decrease Outcome: Progressing   Problem: Tissue Perfusion: Goal: Adequacy of tissue perfusion will improve Outcome: Progressing   Problem: Activity: Goal: Risk for activity intolerance will decrease Outcome: Progressing   Problem: Nutrition: Goal: Adequate nutrition will be maintained Outcome: Progressing   Problem: Coping: Goal: Level of anxiety will decrease Outcome: Progressing   Problem: Safety: Goal: Ability to remain free from injury will improve Outcome: Progressing   Problem: Coping: Goal: Ability to identify and develop effective coping behavior will improve Outcome: Progressing   Problem: Respiratory: Goal: Verbalizations of increased ease of respirations will increase Outcome: Progressing   Problem: Pain Management: Goal: Satisfaction with pain management regimen will improve Outcome: Progressing

## 2022-12-07 NOTE — Death Summary Note (Signed)
DEATH SUMMARY   Patient Details  Name: Keith Howe MRN: [id number] DOB: 04/23/1941 VHQ:IONGEX, Keith Rolls, MD Admission/Discharge Information   Admit Date:  2022-12-20  Date of Death: Date of Death: 2022-12-26  Time of Death: Time of Death: 0840  Length of Stay: 6   Principle Cause of death: Prostate cancer  Hospital Diagnoses: Principal Problem:   Prostate cancer metastatic to bone [facility]) Active Problems:   Hypercalcemia   Shock circulatory (HCC)   Anemia due to bone marrow failure (HCC)   Altered mental status   Normocytic anemia   Essential hypertension   Uncontrolled type 2 diabetes mellitus with hyperglycemia, with long-term current use of insulin (HCC)   Anemia   Chronic kidney disease, stage 3b (HCC)   AKI (acute kidney injury) (HCC)   Acute encephalopathy   HOCM (hypertrophic obstructive cardiomyopathy) [facility])   Hospital Course:  Mr. Keith Howe was an 82 y.o. male with medical history significant of stage IVb prostate cancer with extensive bony metastasis, chronic anemia on IV iron infusions, type 2 diabetes, CKD stage III, BPH, hypertension, who was referred from the cancer center to the emergency department because of altered mental status and hypotension (63/45).  His wife also reported that he had been experiencing worsening fatigue, general malaise and poor oral intake.   He was admitted to the hospital for acute metabolic encephalopathy, acute renal failure, circulatory shock and hypercalcemia.  He was treated with IV fluids and IV Levophed infusion.  Initially, sepsis was suspected so he was treated with empiric IV antibiotics.  However, sepsis was later ruled out and antibiotics were discontinued.  CTA chest did not show any evidence of pulmonary embolism but there was evidence of moderate bilateral pleural effusions and evidence of bony metastasis in the visualized axial skeleton. He was also found to have hypertrophic obstructive cardiomyopathy on 2D  echo.   He was transfused with 3 units of packed red blood cells for worsening anemia of chronic disease.  Hemoglobin kept dropping despite blood transfusion.  Patient became unresponsive.  After discussions with palliative care team, family requested that patient be transition to comfort care.  Unfortunately, he expired on 12-26-2022 at 8:40 AM.        Procedures: Left IJ central line placement  Consultations: Intensivist, oncologist, neurologist, cardiologist, palliative care  The results of significant diagnostics from this hospitalization (including imaging, microbiology, ancillary and laboratory) are listed below for reference.   Significant Diagnostic Studies: MR BRAIN WO CONTRAST  Result Date: 11/24/2022 CLINICAL DATA:  Initial evaluation for mental status change, unknown cause. EXAM: MRI HEAD WITHOUT CONTRAST TECHNIQUE: Multiplanar, multiecho pulse sequences of the brain and surrounding structures were obtained without intravenous contrast. COMPARISON:  Prior CT from 11/22/2022. FINDINGS: Brain: Mild age-related cerebral atrophy. No significant cerebral white matter disease for age. No other focal parenchymal signal abnormality. No evidence for acute or subacute ischemia. Gray-white matter differentiation maintained. No areas of chronic cortical infarction. No acute or chronic intracranial blood products. No mass lesion, midline shift or mass effect. No hydrocephalus or extra-axial fluid collection. Pituitary gland and suprasellar region within normal limits. Vascular: Major intracranial vascular flow voids are maintained. Skull and upper cervical spine: Craniocervical junction within normal limits. Diffusely decreased T1 signal intensity seen within the bone marrow of the visualized upper cervical spine, nonspecific, but most commonly related to anemia, smoking, or obesity. No visible focal marrow replacing lesion. No scalp soft tissue abnormality. Sinuses/Orbits: Prior bilateral ocular  lens replacement. Paranasal sinuses are largely  clear. Trace left mastoid effusion noted, bowel significance. Other: None. IMPRESSION: 1. No acute intracranial abnormality. 2. Mild age-related cerebral atrophy. Electronically Signed   By: Rise Mu M.D.   On: 11/24/2022 00:37   ECHOCARDIOGRAM COMPLETE  Result Date: 11/23/2022    ECHOCARDIOGRAM REPORT   Patient Name:   Keith Howe Date of Exam: 11/23/2022 Medical Rec #:  [id number]        Height:       74.0 in Accession #:    [id number]       Weight:       221.1 lb Date of Birth:  [birth year]        BSA:          2.268 m Patient Age:    82 years         BP:           105/58 mmHg Patient Gender: M                HR:           126 bpm. Exam Location:  [facility] Procedure: 2D Echo, Cardiac Doppler and Color Doppler Indications:     Syncope  History:         Patient has no prior history of Echocardiogram examinations.                  Arrythmias:Tachycardia, Signs/Symptoms:Syncope, Altered Mental                  Status and Edema; Risk Factors:Hypertension and Diabetes.                  Prostate CA.  Sonographer:     Mikki Harbor Referring Phys:  [id number] BRITTON L RUST-CHESTER Diagnosing Phys: Debbe Odea MD  Sonographer Comments: Technically difficult study due to poor echo windows. Image acquisition challenging due to patient behavioral factors. IMPRESSIONS  1. Severe asymmetric basal septal hypertrophy. possible systolic anterior motion of mitral valve causing LVOT gradient. Consider CMR. tachycardia degrading image quality and more accurate assessment .Marland Kitchen Left ventricular ejection fraction, by estimation, is 70 to 75%. The left ventricle has hyperdynamic function. The left ventricle has no regional wall motion abnormalities. There is severe asymmetric left ventricular hypertrophy of the basal-septal segment. Left ventricular diastolic parameters are indeterminate.  2. Right ventricular systolic function is normal. The right ventricular size is  normal. There is moderately elevated pulmonary artery systolic pressure.  3. A small pericardial effusion is present.  4. The mitral valve is normal in structure. Trivial mitral valve regurgitation.  5. The aortic valve is tricuspid. Aortic valve regurgitation is not visualized.  6. Aortic dilatation noted. There is borderline dilatation of the aortic root, measuring 38 mm. FINDINGS  Left Ventricle: Severe asymmetric basal septal hypertrophy. possible systolic anterior motion of mitral valve causing LVOT gradient. Consider CMR. tachycardia degrading image quality and more accurate assessment. Left ventricular ejection fraction, by estimation, is 70 to 75%. The left ventricle has hyperdynamic function. The left ventricle has no regional wall motion abnormalities. The left ventricular internal cavity size was normal in size. There is severe asymmetric left ventricular hypertrophy of  the basal-septal segment. Left ventricular diastolic parameters are indeterminate. Right Ventricle: The right ventricular size is normal. No increase in right ventricular wall thickness. Right ventricular systolic function is normal. There is moderately elevated pulmonary artery systolic pressure. The tricuspid regurgitant velocity is 3.24 m/s, and with an assumed right atrial pressure of 8 mmHg, the estimated right  ventricular systolic pressure is 50.0 mmHg. Left Atrium: Left atrial size was normal in size. Right Atrium: Right atrial size was normal in size. Pericardium: A small pericardial effusion is present. Mitral Valve: The mitral valve is normal in structure. Trivial mitral valve regurgitation. MV peak gradient, 8.3 mmHg. The mean mitral valve gradient is 3.0 mmHg. Tricuspid Valve: The tricuspid valve is normal in structure. Tricuspid valve regurgitation is mild. Aortic Valve: The aortic valve is tricuspid. Aortic valve regurgitation is not visualized. Aortic valve mean gradient measures 5.0 mmHg. Aortic valve peak gradient  measures 8.2 mmHg. Aortic valve area, by VTI measures 2.25 cm. Pulmonic Valve: The pulmonic valve was normal in structure. Pulmonic valve regurgitation is not visualized. Aorta: Aortic dilatation noted. There is borderline dilatation of the aortic root, measuring 38 mm. Venous: The inferior vena cava was not well visualized. IAS/Shunts: No atrial level shunt detected by color flow Doppler.  LEFT VENTRICLE PLAX 2D LVIDd:         3.10 cm LVIDs:         1.70 cm LV PW:         1.20 cm LV IVS:        2.10 cm LVOT diam:     2.00 cm LV SV:         49 LV SV Index:   21 LVOT Area:     3.14 cm  RIGHT VENTRICLE RV Basal diam:  2.85 cm RV Mid diam:    3.00 cm LEFT ATRIUM             Index        RIGHT ATRIUM           Index LA diam:        2.30 cm 1.01 cm/m   RA Area:     11.10 cm LA Vol (A2C):   23.1 ml 10.18 ml/m  RA Volume:   26.40 ml  11.64 ml/m LA Vol (A4C):   18.5 ml 8.16 ml/m LA Biplane Vol: 19.9 ml 8.77 ml/m  AORTIC VALVE                     PULMONIC VALVE AV Area (Vmax):    2.57 cm      PV Vmax:       1.75 m/s AV Area (Vmean):   2.04 cm      PV Peak grad:  12.2 mmHg AV Area (VTI):     2.25 cm AV Vmax:           143.00 cm/s AV Vmean:          103.000 cm/s AV VTI:            0.216 m AV Peak Grad:      8.2 mmHg AV Mean Grad:      5.0 mmHg LVOT Vmax:         117.00 cm/s LVOT Vmean:        66.900 cm/s LVOT VTI:          0.155 m LVOT/AV VTI ratio: 0.72  AORTA Ao Root diam: 3.80 cm MITRAL VALVE               TRICUSPID VALVE MV Area (PHT): 3.99 cm    TR Peak grad:   42.0 mmHg MV Area VTI:   3.16 cm    TR Vmax:        324.00 cm/s MV Peak grad:  8.3 mmHg MV Mean grad:  3.0 mmHg  SHUNTS MV Vmax:       1.44 m/s    Systemic VTI:  0.16 m MV Vmean:      66.8 cm/s   Systemic Diam: 2.00 cm MV Decel Time: 190 msec MV E velocity: 76.50 cm/s Debbe Odea MD Electronically signed by Debbe Odea MD Signature Date/Time: 11/23/2022/5:35:22 PM    Final    CT Angio Chest Pulmonary Embolism (PE) W or WO  Contrast  Result Date: 11/23/2022 CLINICAL DATA:  Pulmonary embolism, progressive fatigue EXAM: CT ANGIOGRAPHY CHEST WITH CONTRAST TECHNIQUE: Multidetector CT imaging of the chest was performed using the standard protocol during bolus administration of intravenous contrast. Multiplanar CT image reconstructions and MIPs were obtained to evaluate the vascular anatomy. RADIATION DOSE REDUCTION: This exam was performed according to the departmental dose-optimization program which includes automated exposure control, adjustment of the mA and/or kV according to patient size and/or use of iterative reconstruction technique. CONTRAST:  OMNIPAQUE IOHEXOL 350 MG/ML SOLN COMPARISON:  None Available. FINDINGS: Cardiovascular: There is adequate opacification of the pulmonary arterial tree. No intraluminal filling defect identified to suggest acute pulmonary embolism. Central pulmonary arteries are of normal caliber. There is extensive multi-vessel coronary artery calcification. Global cardiac size is within normal limits. Small pericardial effusion. Minimal atherosclerotic calcification within the thoracic aorta. No aortic aneurysm. Arch vasculature is widely patent proximally. Left internal jugular central venous catheter tip noted at the superior vena caval confluence. Mediastinum/Nodes: No enlarged mediastinal, hilar, or axillary lymph nodes. Thyroid gland, trachea, and esophagus demonstrate no significant findings. Lungs/Pleura: Moderate bilateral pleural effusions are present with bilateral dependent atelectasis. Aerated lungs are clear. No pneumothorax. Stable small calcified pleural plaques within the basilar pleural spaces bilaterally, nonspecific. Upper Abdomen: No acute abnormality Musculoskeletal: Stable compression deformities of a T5 and T6 diffuse mixed lytic and sclerotic metastatic disease is again seen throughout the visualized axial skeleton in keeping with patient's known metastatic prostate cancer. No  acute bone abnormality. Review of the MIP images confirms the above findings. IMPRESSION: 1. No pulmonary embolism. 2. Extensive multi-vessel coronary artery calcification. 3. Moderate bilateral pleural effusions with bilateral dependent atelectasis. 4. Stable small calcified pleural plaques within the basilar pleural spaces bilaterally, nonspecific. 5. Stable diffuse mixed lytic and sclerotic metastatic disease throughout the visualized axial skeleton in keeping with patient's known metastatic prostate cancer. 6. Aortic atherosclerosis. Aortic Atherosclerosis (ICD10-I70.0). Electronically Signed   By: Helyn Numbers M.D.   On: 11/23/2022 00:02   CT HEAD WO CONTRAST ( )  Result Date: 11/22/2022 CLINICAL DATA:  Mental status change of unknown cause. Hypotension. Fatigue and malaise. EXAM: CT HEAD WITHOUT CONTRAST TECHNIQUE: Contiguous axial images were obtained from the base of the skull through the vertex without intravenous contrast. RADIATION DOSE REDUCTION: This exam was performed according to the departmental dose-optimization program which includes automated exposure control, adjustment of the mA and/or kV according to patient size and/or use of iterative reconstruction technique. COMPARISON:  MRI brain 12/04/2022.  CT head 11/06/2022 FINDINGS: Brain: No evidence of acute infarction, hemorrhage, hydrocephalus, extra-axial collection or mass lesion/mass effect. Mild diffuse cerebral atrophy. Old lacunar infarct in the right basal ganglia. Vascular: No hyperdense vessel or unexpected calcification. Skull: Normal. Negative for fracture or focal lesion. Sinuses/Orbits: No acute finding. Other: None. IMPRESSION: No acute intracranial abnormalities. Mild cerebral atrophy. No significant change since previous studies. Electronically Signed   By: Burman Nieves M.D.   On: 11/22/2022 23:55   DG Chest Port 1 View  Result Date: 11/22/2022 CLINICAL DATA:  Central line  placement EXAM: PORTABLE CHEST 1 VIEW  COMPARISON:  11/18/2022 FINDINGS: A left central venous catheter has been placed with tip over the mid SVC region. No pneumothorax. Shallow inspiration with linear atelectasis in the lung bases similar to prior study. Heart size and pulmonary vascularity are normal for technique. No pleural effusions. Mediastinal contours appear intact. IMPRESSION: A left central venous catheter has been placed with tip projecting over the mid SVC region. Shallow inspiration with atelectasis in the lung bases. Electronically Signed   By: Burman Nieves M.D.   On: 11/22/2022 23:32   EEG adult  Result Date: 11/21/2022 Jefferson Fuel, MD     11/21/2022  2:13 PM Routine EEG Report Keith Howe is a 82 y.o. male with a history of altered mental status who is undergoing an EEG to evaluate for seizures. Report: This EEG was acquired with electrodes placed according to the International 10-20 electrode system (including Fp1, Fp2, F3, F4, C3, C4, P3, P4, O1, O2, T3, T4, T5, T6, A1, A2, Fz, Cz, Pz). The following electrodes were missing or displaced: none. The occipital dominant rhythm was 7 Hz. This activity is reactive to stimulation. Drowsiness was manifested by background fragmentation; deeper stages of sleep were identified by K complexes and sleep spindles. There was no focal slowing. There were no interictal epileptiform discharges. There were no electrographic seizures identified. Photic stimulation and hyperventilation were not performed. Impression and clinical correlation: This EEG was obtained while awake and asleep and is abnormal due to mild diffuse slowing indicative of global cerebral dysfunction. Epileptiform abnormalities were not seen during this recording. Bing Neighbors, MD [facility] [phone] If 7pm- 7am, please page neurology on call as listed in AMION.   MR BRAIN W WO CONTRAST  Result Date: 11/24/2022 CLINICAL DATA:  Neuro deficit, acute, stroke suspected. Right worse than left sided  weakness. EXAM: MRI HEAD WITHOUT AND WITH CONTRAST TECHNIQUE: Multiplanar, multiecho pulse sequences of the brain and surrounding structures were obtained without and with intravenous contrast. CONTRAST:  10mL GADAVIST GADOBUTROL 1 MMOL/ML IV SOLN COMPARISON:  CT studies same day. FINDINGS: Brain: Diffusion imaging does not show any acute or subacute infarction. No focal abnormality affects the brainstem or cerebellum. Cerebral hemispheres show age related volume loss without subjective lobar predominance. Patient shows only a very few punctate foci of T2 and FLAIR signal in the white matter, less than usually seen at this age. Dilated perivascular space incidental at the base of the brain on the right. No cortical or large vessel territory infarction. No mass lesion, hemorrhage, hydrocephalus or extra-axial collection. After contrast administration, no abnormal enhancement occurs. Vascular: Major vessels at the base of the brain show flow. Skull and upper cervical spine: Negative Sinuses/Orbits: Clear/normal Other: None IMPRESSION: No acute finding by MRI. Age related volume loss. Minimal white matter changes, less than usually seen at this age. Electronically Signed   By: Paulina Fusi M.D.   On: 11/30/2022 13:11   DG Chest Port 1 View  Result Date: 11/30/2022 CLINICAL DATA:  Clammy and diaphoretic. EXAM: PORTABLE CHEST 1 VIEW COMPARISON:  November 13 2022 FINDINGS: Cardiomediastinal silhouette is normal. Mediastinal contours appear intact. There is no evidence of focal airspace consolidation, pleural effusion or pneumothorax. Low lung volumes. Osseous structures are without acute abnormality. Soft tissues are grossly normal. IMPRESSION: No active disease. Low lung volumes. Electronically Signed   By: Ted Mcalpine M.D.   On: 11/19/2022 11:43   CT ANGIO HEAD NECK W WO CM (CODE STROKE)  Result  Date: 12/01/2022 CLINICAL DATA:  82 year old male code stroke presentation. Hypotensive. Prostate cancer. EXAM: CT  ANGIOGRAPHY HEAD AND NECK TECHNIQUE: Multidetector CT imaging of the head and neck was performed using the standard protocol during bolus administration of intravenous contrast. Multiplanar CT image reconstructions and MIPs were obtained to evaluate the vascular anatomy. Carotid stenosis measurements (when applicable) are obtained utilizing NASCET criteria, using the distal internal carotid diameter as the denominator. RADIATION DOSE REDUCTION: This exam was performed according to the departmental dose-optimization program which includes automated exposure control, adjustment of the mA and/or kV according to patient size and/or use of iterative reconstruction technique. CONTRAST:  75mL OMNIPAQUE IOHEXOL 350 MG/ML SOLN COMPARISON:  Plain head CT 1018 hours today. FINDINGS: CTA NECK Skeleton: Advanced cervical spine degeneration. No acute or suspicious osseous lesion identified. Upper chest: Small bilateral layering pleural effusions. Otherwise negative lung apices. Negative visible superior mediastinum. Other neck: Negative. Aortic arch: Mildly bovine arch configuration. Minimal arch atherosclerosis. Right carotid system: Negative brachiocephalic and right CCA. Mild soft and calcified plaque at the right ICA origin but bulky calcified plaque at the right ICA bulb. 50 % stenosis with respect to the distal vessel (series 5, image 103). Patent right ICA to the skull base. Left carotid system: Negative left CCA. Minimal calcified plaque at the left carotid bifurcation and no stenosis. Vertebral arteries: Proximal right subclavian artery and right vertebral artery origin are normal. Right vertebral artery appears dominant and is patent to the skull base with no plaque or stenosis. Proximal left subclavian artery minimal plaque without stenosis. Calcified plaque at the left vertebral artery origin with mild to moderate stenosis on series 8, image 173. Non dominant left vertebral artery is patent to the skull base with no  additional plaque or stenosis. CTA HEAD Posterior circulation: Patent distal vertebral arteries and vertebrobasilar junction with no significant plaque or stenosis. Patent PICA origins. Right V4 is dominant. Patent basilar artery, basilar tip, SCA and PCA origins without stenosis. Left posterior communicating artery is present, the right is diminutive or absent. Bilateral PCA branches are within normal limits. Anterior circulation: Both ICA siphons are patent. Left siphon mild calcified plaque with no significant stenosis. Normal left posterior communicating artery origin. Associated mild right supraclinoid segment stenosis. Patent carotid termini. Patent MCA and ACA origins. Tortuous A1 segments. Anterior communicating artery and bilateral ACA branches are within normal limits. Left MCA M1 segment and bifurcation are patent without stenosis. Right MCA M1 segment and bifurcation are patent without stenosis. Bilateral MCA branches appear symmetric and within normal limits. Venous sinuses: Early contrast timing, not well evaluated. Anatomic variants: Dominant right vertebral artery. Review of the MIP images confirms the above findings IMPRESSION: 1. Negative for large vessel occlusion. Generally mild for age atherosclerosis in the head and neck. 2. Right ICA bulb bulky calcified plaque with 50% stenosis. Up to Moderate stenosis of the Non Dominant Left Vertebral Artery origin, and mild stenosis of the supraclinoid Right ICA stenosis are due to calcified plaque. 3. Small bilateral layering pleural effusions. Salient findings were communicated to Dr. Andree Elk at 10:38 am on 12/05/2022 by text page via the [facility] messaging system. Electronically Signed   By: Odessa Fleming M.D.   On: 12/06/2022 10:38   CT HEAD CODE STROKE WO CONTRAST  Result Date: 11/25/2022 CLINICAL DATA:  Code stroke. Neuro deficit, acute, stroke suspected. EXAM: CT HEAD WITHOUT CONTRAST TECHNIQUE: Contiguous axial images were obtained from the base of the  skull through the vertex without intravenous contrast. RADIATION  DOSE REDUCTION: This exam was performed according to the departmental dose-optimization program which includes automated exposure control, adjustment of the mA and/or kV according to patient size and/or use of iterative reconstruction technique. COMPARISON:  09/21/2022 FINDINGS: Brain: No focal abnormality seen affecting the brainstem or cerebellum. There is an old right inferior basal ganglia infarction or dilated perivascular space. Chronic small-vessel ischemic changes affect the cerebral hemispheric white matter. No sign of acute infarction, mass lesion, hemorrhage, hydrocephalus or extra-axial collection. Vascular: There is atherosclerotic calcification of the major vessels at the base of the brain. Skull: Negative Sinuses/Orbits: Clear/normal Other: None ASPECTS ([place]berta Stroke Program Early CT Score) - Ganglionic level infarction (caudate, lentiform nuclei, internal capsule, insula, M1-M3 cortex): 7 - Supraganglionic infarction (M4-M6 cortex): 3 Total score (0-10 with 10 being normal): 10 IMPRESSION: 1. No acute finding by CT. Chronic small-vessel ischemic changes of the cerebral hemispheric white matter. 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 2. Aspects is 10. These results were communicated to Dr. Selina Cooley at 10:22 am on 11/18/2022 by text page via the [facility] messaging system. Electronically Signed   By: Paulina Fusi M.D.   On: 11/09/2022 10:24   IR BONE MARROW BIOPSY & ASPIRATION  Result Date: 11/15/2022 INDICATION: Anemia EXAM: Bone marrow aspiration and core biopsy using fluoroscopic guidance MEDICATIONS: None. ANESTHESIA/SEDATION: Moderate (conscious) sedation was employed during this procedure. A total of Versed 1 mg and Fentanyl 25 mcg was administered intravenously. Moderate Sedation Time: 10 minutes. The patient's level of consciousness and vital signs were monitored continuously by radiology nursing  throughout the procedure under my direct supervision. FLUOROSCOPY TIME:  Fluoroscopy Time: 0.5 minutes (8 mGy) COMPLICATIONS: None immediate. PROCEDURE: Informed written consent was obtained from the patient after a thorough discussion of the procedural risks, benefits and alternatives. All questions were addressed. Maximal Sterile Barrier Technique was utilized including caps, mask, sterile gowns, sterile gloves, sterile drape, hand hygiene and skin antiseptic. A timeout was performed prior to the initiation of the procedure. The patient was placed prone on the exam table. Limited fluoroscopy of the pelvis was performed for planning purposes. Skin entry site was marked, and the overlying skin was prepped and draped in the standard sterile fashion. Local analgesia was obtained with 1% lidocaine. Using fluoroscopic guidance, an 11 gauge needle was advanced just deep to the cortex of the right posterior ilium. Subsequently, bone marrow aspiration and core biopsy were performed. Due to relatively dry tap, a second core biopsy was obtained. Specimens were submitted to lab/pathology for handling. Hemostasis was achieved with manual pressure, and a clean dressing was placed. The patient tolerated the procedure well without immediate complication. IMPRESSION: Successful bone marrow aspiration and core biopsy of the right posterior ilium. Note is made of a relatively dry tap, and an additional core specimen was obtained. Electronically Signed   By: Olive Bass M.D.   On: 11/15/2022 10:30   CT ABDOMEN PELVIS W CONTRAST  Result Date: 11/13/2022 CLINICAL DATA:  uti, possible pyelo, continues to spike fevers EXAM: CT ABDOMEN AND PELVIS WITH CONTRAST TECHNIQUE: Multidetector CT imaging of the abdomen and pelvis was performed using the standard protocol following bolus administration of intravenous contrast. RADIATION DOSE REDUCTION: This exam was performed according to the departmental dose-optimization program which  includes automated exposure control, adjustment of the mA and/or kV according to patient size and/or use of iterative reconstruction technique. CONTRAST:  OMNIPAQUE IOHEXOL 300 MG/ML  SOLN COMPARISON:  CT 3 days ago 11/10/2022 FINDINGS: Lower chest:  Trace bilateral pleural effusions and basilar atelectasis, right greater than left. Coronary artery calcifications/stents. Hepatobiliary: No suspicious hepatic lesion. Punctate granuloma in the subcapsular right lobe of the liver. Gallbladder is only minimally distended. No calcified gallstone or biliary dilatation. Pancreas: Parenchymal atrophy. No ductal dilatation or inflammation. Spleen: Normal in size without focal abnormality. Adrenals/Urinary Tract: No adrenal nodule. Heterogeneous enhancement of the upper pole of the right kidney persists, suspicious for focal pyelonephritis. There is no perirenal or intrarenal fluid collection. Mild enhancement of the right ureter and left renal collecting system. No renal calculi. Foley catheter decompresses the urinary bladder. There is circumferential bladder wall thickening. Mild residual perinephric fat stranding. Stomach/Bowel: No bowel obstruction or inflammation. Normal appendix. Moderate colonic stool burden. Vascular/Lymphatic: Aortic atherosclerosis. No aneurysm. No bulky abdominopelvic adenopathy. Reproductive: Enlarged prostate gland causing mass effect on the bladder base. Again seen clips in the prostate. Other: Small right greater than left fat containing inguinal hernias. Small fat containing umbilical hernia. No ascites or abdominopelvic collection. There is mild subcutaneous edema. Musculoskeletal: Stable osseous structures. The bones appear under mineralized. Transitional lumbosacral anatomy with 4 lumbar type vertebra. Stable T7 and upper lumbar minimal compression deformities. No intramuscular collection. IMPRESSION: 1. Heterogeneous enhancement of the upper pole of the right kidney persists,  suspicious for focal pyelonephritis. There is no perirenal or intrarenal fluid collection. 2. Mild enhancement of the right ureter and left renal collecting system, suspicious for ascending urinary tract infection. 3. Circumferential bladder wall thickening with Foley catheter in place. 4. Enlarged prostate gland causing mass effect on the bladder base. 5. Trace bilateral pleural effusions and basilar atelectasis, right greater than left. 6. Additional stable chronic findings as described. Aortic Atherosclerosis (ICD10-I70.0). Electronically Signed   By: Narda Rutherford M.D.   On: 11/13/2022 15:42   DG Chest Port 1 View  Result Date: 11/13/2022 CLINICAL DATA:  Cough EXAM: PORTABLE CHEST 1 VIEW COMPARISON:  07/31/2022 FINDINGS: The heart size and mediastinal contours are within normal limits. Low lung volumes with elevation of the right hemidiaphragm. No focal airspace consolidation, pleural effusion, or pneumothorax. Known bony metastatic lesions, better seen on previous PET-CT. IMPRESSION: No active disease. Electronically Signed   By: Duanne Guess D.O.   On: 11/13/2022 13:42   CT ABDOMEN PELVIS W CONTRAST  Result Date: 11/11/2022 CLINICAL DATA:  Acute abdominal pain.  Back pain.  Prostate cancer. EXAM: CT ABDOMEN AND PELVIS WITH CONTRAST TECHNIQUE: Multidetector CT imaging of the abdomen and pelvis was performed using the standard protocol following bolus administration of intravenous contrast. RADIATION DOSE REDUCTION: This exam was performed according to the departmental dose-optimization program which includes automated exposure control, adjustment of the mA and/or kV according to patient size and/or use of iterative reconstruction technique. CONTRAST:  OMNIPAQUE IOHEXOL 300 MG/ML  SOLN COMPARISON:  CT renal stone 10/13/2022.  PET-CT 10/09/2022. FINDINGS: Lower chest: There is a trace right pleural effusion. Hepatobiliary: No focal liver abnormality is seen. No gallstones, gallbladder wall  thickening, or biliary dilatation. Pancreas: Unremarkable. No pancreatic ductal dilatation or surrounding inflammatory changes. Spleen: Normal in size without focal abnormality. Adrenals/Urinary Tract: Foley catheter is seen within bladder. There is marked diffuse bladder wall thickening with mild surrounding inflammation. Small amount of air in the bladder is likely related to Foley catheter placement. There is no hydronephrosis or perinephric fluid. There is some ill-defined patchy areas of hypoattenuation in the superior pole the right kidney which may represent pyelonephritis. There is no urinary tract calculus. The adrenal glands are within  normal limits. Stomach/Bowel: Stomach is within normal limits. Appendix appears normal. No evidence of bowel wall thickening, distention, or inflammatory changes. Vascular/Lymphatic: Aortic atherosclerosis. No enlarged abdominal or pelvic lymph nodes. Reproductive: Prostate gland is enlarged. Prostate radiotherapy seeds are present. Other: There is a small fat containing right inguinal hernia. There is a small umbilical hernia containing nondilated bowel. There is no ascites. Small subcutaneous nodules in the anterior right abdominal wall are unchanged. Musculoskeletal: There is mild new compression deformity of the superior endplate of L2. Mild compression deformity of the superior endplate of L1 appears stable. Degenerative changes affect the spine and hips. IMPRESSION: 1. Marked diffuse bladder wall thickening with surrounding inflammation worrisome for cystitis. 2. Patchy areas of hypoattenuation in the superior pole the right kidney may represent pyelonephritis. 3. New acute mild compression deformity of the superior endplate of L2. 4. Trace right pleural effusion. Aortic Atherosclerosis (ICD10-I70.0). Electronically Signed   By: Darliss Cheney M.D.   On: 11/11/2022 00:02    Microbiology: Recent Results (from the past 240 hour(s))  Resp panel by RT-PCR (RSV, Flu A&B,  Covid) Anterior Nasal Swab     Status: None   Collection Time: 11/24/2022 11:48 AM   Specimen: Anterior Nasal Swab  Result Value Ref Range Status   SARS Coronavirus 2 by RT PCR NEGATIVE NEGATIVE Final    Comment: (NOTE) SARS-CoV-2 target nucleic acids are NOT DETECTED.  The SARS-CoV-2 RNA is generally detectable in upper respiratory specimens during the acute phase of infection. The lowest concentration of SARS-CoV-2 viral copies this assay can detect is 138 copies/mL. A negative result does not preclude SARS-Cov-2 infection and should not be used as the sole basis for treatment or other patient management decisions. A negative result may occur with  improper specimen collection/handling, submission of specimen other than nasopharyngeal swab, presence of viral mutation(s) within the areas targeted by this assay, and inadequate number of viral copies(<138 copies/mL). A negative result must be combined with clinical observations, patient history, and epidemiological information. The expected result is Negative.  Fact Sheet for Patients:  BloggerCourse.com  Fact Sheet for Healthcare Providers:  SeriousBroker.it  This test is no t yet approved or cleared by the [place] FDA and  has been authorized for detection and/or diagnosis of SARS-CoV-2 by FDA under an Emergency Use Authorization (EUA). This EUA will remain  in effect (meaning this test can be used) for the duration of the COVID-19 declaration under Section 564(b)(1) of the Act, 21 U.S.C.section 360bbb-3(b)(1), unless the authorization is terminated  or revoked sooner.       Influenza A by PCR NEGATIVE NEGATIVE Final   Influenza B by PCR NEGATIVE NEGATIVE Final    Comment: (NOTE) The Xpert Xpress SARS-CoV-2/FLU/RSV plus assay is intended as an aid in the diagnosis of influenza from Nasopharyngeal swab specimens and should not be used as a sole basis for treatment. Nasal  washings and aspirates are unacceptable for Xpert Xpress SARS-CoV-2/FLU/RSV testing.  Fact Sheet for Patients: BloggerCourse.com  Fact Sheet for Healthcare Providers: SeriousBroker.it  This test is not yet approved or cleared by the [place] FDA and has been authorized for detection and/or diagnosis of SARS-CoV-2 by FDA under an Emergency Use Authorization (EUA). This EUA will remain in effect (meaning this test can be used) for the duration of the COVID-19 declaration under Section 564(b)(1) of the Act, 21 U.S.C. section 360bbb-3(b)(1), unless the authorization is terminated or revoked.     Resp Syncytial Virus by PCR NEGATIVE NEGATIVE Final  Comment: (NOTE) Fact Sheet for Patients: BloggerCourse.com  Fact Sheet for Healthcare Providers: SeriousBroker.it  This test is not yet approved or cleared by the [place] FDA and has been authorized for detection and/or diagnosis of SARS-CoV-2 by FDA under an Emergency Use Authorization (EUA). This EUA will remain in effect (meaning this test can be used) for the duration of the COVID-19 declaration under Section 564(b)(1) of the Act, 21 U.S.C. section 360bbb-3(b)(1), unless the authorization is terminated or revoked.  Performed at [facility], [address] [place], [place] [address]   Blood Culture (routine x 2)     Status: None   Collection Time: Dec 12, 2022  2:45 PM   Specimen: BLOOD  Result Value Ref Range Status   Specimen Description BLOOD BLOOD RIGHT HAND  Final   Special Requests   Final    BOTTLES DRAWN AEROBIC AND ANAEROBIC Blood Culture adequate volume   Culture   Final    NO GROWTH 5 DAYS Performed at [facility], [address], [place], [place] [address]    Report Status 11/25/2022 FINAL  Final  Urine Culture     Status: None   Collection Time: 12-12-22  3:52 PM   Specimen: Urine,  Random  Result Value Ref Range Status   Specimen Description   Final    URINE, RANDOM Performed at [facility], [address], [place], [place] [address]    Special Requests   Final    NONE Performed at [facility], [address], [place], [place] [address]    Culture   Final    NO GROWTH Performed at [facility], [address], [place], [place] [address]    Report Status 11/21/2022 FINAL  Final  Blood Culture (routine x 2)     Status: None   Collection Time: 12-12-22  9:24 PM   Specimen: BLOOD  Result Value Ref Range Status   Specimen Description BLOOD BLOOD LEFT ARM  Final   Special Requests   Final    BOTTLES DRAWN AEROBIC AND ANAEROBIC Blood Culture adequate volume   Culture   Final    NO GROWTH 5 DAYS Performed at [facility], [address], [place], [place] [address]    Report Status 11/25/2022 FINAL  Final  MRSA Next Gen by PCR, Nasal     Status: None   Collection Time: 11/22/22 11:10 PM   Specimen: Nasal Mucosa; Nasal Swab  Result Value Ref Range Status   MRSA by PCR Next Gen NOT DETECTED NOT DETECTED Final    Comment: (NOTE) The GeneXpert MRSA Assay (FDA approved for NASAL specimens only), is one component of a comprehensive MRSA colonization surveillance program. It is not intended to diagnose MRSA infection nor to guide or monitor treatment for MRSA infections. Test performance is not FDA approved in patients less than 39 years old. Performed at [facility], [address], [place], [place] [address]     Time spent: Greater than 30 minutes  Signed: Lurene Shadow, MD 11/21/2022

## 2022-12-07 DEATH — deceased
